# Patient Record
Sex: Female | Born: 1950
Health system: Southern US, Community
[De-identification: ages and names within clinical notes are randomized; demographics above are authoritative.]

## PROBLEM LIST (undated history)

## (undated) DIAGNOSIS — J45909 Unspecified asthma, uncomplicated: Secondary | ICD-10-CM

## (undated) DIAGNOSIS — K219 Gastro-esophageal reflux disease without esophagitis: Secondary | ICD-10-CM

## (undated) DIAGNOSIS — M1990 Unspecified osteoarthritis, unspecified site: Secondary | ICD-10-CM

## (undated) DIAGNOSIS — K7689 Other specified diseases of liver: Secondary | ICD-10-CM

## (undated) DIAGNOSIS — I10 Essential (primary) hypertension: Secondary | ICD-10-CM

## (undated) DIAGNOSIS — C801 Malignant (primary) neoplasm, unspecified: Secondary | ICD-10-CM

## (undated) DIAGNOSIS — N6019 Diffuse cystic mastopathy of unspecified breast: Secondary | ICD-10-CM

## (undated) DIAGNOSIS — G43909 Migraine, unspecified, not intractable, without status migrainosus: Secondary | ICD-10-CM

## (undated) DIAGNOSIS — G4700 Insomnia, unspecified: Secondary | ICD-10-CM

## (undated) DIAGNOSIS — H269 Unspecified cataract: Secondary | ICD-10-CM

## (undated) DIAGNOSIS — M7121 Synovial cyst of popliteal space [Baker], right knee: Secondary | ICD-10-CM

## (undated) DIAGNOSIS — E119 Type 2 diabetes mellitus without complications: Secondary | ICD-10-CM

## (undated) DIAGNOSIS — Z780 Asymptomatic menopausal state: Secondary | ICD-10-CM

## (undated) DIAGNOSIS — E785 Hyperlipidemia, unspecified: Secondary | ICD-10-CM

## (undated) DIAGNOSIS — G4730 Sleep apnea, unspecified: Secondary | ICD-10-CM

## (undated) DIAGNOSIS — E079 Disorder of thyroid, unspecified: Secondary | ICD-10-CM

## (undated) DIAGNOSIS — T7840XA Allergy, unspecified, initial encounter: Secondary | ICD-10-CM

## (undated) DIAGNOSIS — R002 Palpitations: Secondary | ICD-10-CM

## (undated) DIAGNOSIS — Z0000 Encounter for general adult medical examination without abnormal findings: Secondary | ICD-10-CM

## (undated) HISTORY — DX: Insomnia, unspecified: G47.00

## (undated) HISTORY — DX: Unspecified osteoarthritis, unspecified site: M19.90

## (undated) HISTORY — DX: Palpitations: R00.2

## (undated) HISTORY — PX: CHOLECYSTECTOMY: SHX55

## (undated) HISTORY — DX: Synovial cyst of popliteal space (Baker), right knee: M71.21

## (undated) HISTORY — DX: Diffuse cystic mastopathy of unspecified breast: N60.19

## (undated) HISTORY — DX: Asymptomatic menopausal state: Z78.0

## (undated) HISTORY — DX: Other specified diseases of liver: K76.89

## (undated) HISTORY — PX: APPENDECTOMY: SHX54

## (undated) HISTORY — DX: Hyperlipidemia, unspecified: E78.5

## (undated) HISTORY — DX: Malignant (primary) neoplasm, unspecified: C80.1

## (undated) HISTORY — DX: Unspecified cataract: H26.9

## (undated) HISTORY — PX: KNEE ARTHROPLASTY: SHX992

## (undated) HISTORY — DX: Disorder of thyroid, unspecified: E07.9

## (undated) HISTORY — DX: Sleep apnea, unspecified: G47.30

## (undated) HISTORY — PX: SHOULDER ARTHROSCOPY W/ ROTATOR CUFF REPAIR: SHX2400

## (undated) HISTORY — DX: Allergy, unspecified, initial encounter: T78.40XA

## (undated) HISTORY — DX: Migraine, unspecified, not intractable, without status migrainosus: G43.909

## (undated) HISTORY — DX: Unspecified asthma, uncomplicated: J45.909

## (undated) HISTORY — PX: ABDOMINAL HYSTERECTOMY: SHX81

## (undated) HISTORY — PX: CARPAL TUNNEL RELEASE: SHX101

## (undated) HISTORY — DX: Essential (primary) hypertension: I10

## (undated) HISTORY — DX: Gastro-esophageal reflux disease without esophagitis: K21.9

## (undated) HISTORY — DX: Type 2 diabetes mellitus without complications: E11.9

## (undated) HISTORY — DX: Encounter for general adult medical examination without abnormal findings: Z00.00

## (undated) HISTORY — PX: TUBAL LIGATION: SHX77

---

## 2000-07-18 DIAGNOSIS — C801 Malignant (primary) neoplasm, unspecified: Secondary | ICD-10-CM

## 2000-07-18 HISTORY — DX: Malignant (primary) neoplasm, unspecified: C80.1

## 2000-07-18 HISTORY — PX: THYROIDECTOMY: SHX17

## 2013-08-09 ENCOUNTER — Telehealth: Payer: Self-pay | Admitting: *Deleted

## 2013-08-22 ENCOUNTER — Encounter: Payer: Self-pay | Admitting: Internal Medicine

## 2013-08-22 ENCOUNTER — Ambulatory Visit (INDEPENDENT_AMBULATORY_CARE_PROVIDER_SITE_OTHER): Payer: BC Managed Care – PPO | Admitting: Internal Medicine

## 2013-08-22 VITALS — BP 141/86 | HR 93 | Temp 97.8°F | Resp 18 | Wt 221.0 lb

## 2013-08-22 DIAGNOSIS — M1990 Unspecified osteoarthritis, unspecified site: Secondary | ICD-10-CM

## 2013-08-22 DIAGNOSIS — I341 Nonrheumatic mitral (valve) prolapse: Secondary | ICD-10-CM

## 2013-08-22 DIAGNOSIS — K219 Gastro-esophageal reflux disease without esophagitis: Secondary | ICD-10-CM

## 2013-08-22 DIAGNOSIS — E039 Hypothyroidism, unspecified: Secondary | ICD-10-CM

## 2013-08-22 DIAGNOSIS — J45909 Unspecified asthma, uncomplicated: Secondary | ICD-10-CM

## 2013-08-22 DIAGNOSIS — J309 Allergic rhinitis, unspecified: Secondary | ICD-10-CM

## 2013-08-22 DIAGNOSIS — K449 Diaphragmatic hernia without obstruction or gangrene: Secondary | ICD-10-CM

## 2013-08-22 DIAGNOSIS — N951 Menopausal and female climacteric states: Secondary | ICD-10-CM

## 2013-08-22 DIAGNOSIS — K635 Polyp of colon: Secondary | ICD-10-CM

## 2013-08-22 DIAGNOSIS — C73 Malignant neoplasm of thyroid gland: Secondary | ICD-10-CM

## 2013-08-22 DIAGNOSIS — Z90721 Acquired absence of ovaries, unilateral: Secondary | ICD-10-CM

## 2013-08-22 DIAGNOSIS — Z85850 Personal history of malignant neoplasm of thyroid: Secondary | ICD-10-CM | POA: Insufficient documentation

## 2013-08-22 DIAGNOSIS — Z780 Asymptomatic menopausal state: Secondary | ICD-10-CM

## 2013-08-22 DIAGNOSIS — I10 Essential (primary) hypertension: Secondary | ICD-10-CM

## 2013-08-22 DIAGNOSIS — E785 Hyperlipidemia, unspecified: Secondary | ICD-10-CM | POA: Insufficient documentation

## 2013-08-22 DIAGNOSIS — Z90710 Acquired absence of both cervix and uterus: Secondary | ICD-10-CM

## 2013-08-22 DIAGNOSIS — N6019 Diffuse cystic mastopathy of unspecified breast: Secondary | ICD-10-CM

## 2013-08-22 DIAGNOSIS — I059 Rheumatic mitral valve disease, unspecified: Secondary | ICD-10-CM

## 2013-08-22 DIAGNOSIS — K589 Irritable bowel syndrome without diarrhea: Secondary | ICD-10-CM

## 2013-08-22 DIAGNOSIS — R490 Dysphonia: Secondary | ICD-10-CM

## 2013-08-22 DIAGNOSIS — G43909 Migraine, unspecified, not intractable, without status migrainosus: Secondary | ICD-10-CM | POA: Insufficient documentation

## 2013-08-22 DIAGNOSIS — D126 Benign neoplasm of colon, unspecified: Secondary | ICD-10-CM

## 2013-08-22 MED ORDER — TRIAMTERENE-HCTZ 75-50 MG PO TABS: 1.0000 | ORAL_TABLET | Freq: Every day | ORAL | Status: DC

## 2013-08-22 MED ORDER — PANTOPRAZOLE SODIUM 40 MG PO TBEC: 40.0000 mg | DELAYED_RELEASE_TABLET | Freq: Every day | ORAL | Status: DC

## 2013-08-22 NOTE — Progress Notes (Signed)
   Subjective:    Patient ID: Monica Wallace, female    DOB: [birth year], 63 y.o.   MRN: [id number]  HPI Monica Wallace is here for first visit  Primary care.  Formerly of [employer].  Lived in [place] since September.  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   PMH of HTN, Hyperlipdemia, Thyroid cancer  (S/P RAI tx) with Hypothyroidism,  MVP, DJD, fibrocystic breast disease, GERD  ( on Prilosec and Dexilant if she can afford).  Migraine headache  (Imitrex), menopause, ASthma (treated with ProAir and Advair) , HIatal hernia,  IBS, and colon polyps.    She has sister with pancreatic cancer  She is S/P hysterectomy with remaining Left oopherectomy.  She is most concerned today with chronic hoarseness for the past 3-4 months and especially noted after reflux episodes.  She had been given Delixant for GERD but frequently cannnot afford.  She will use Prilosec daily.    She tells me she feels that her neck is swollen . She does not feel any masses in her neck.   She has a history of thyroid cancer.  She has never had an ENT eval for her hoarseness.  Asthma  She will wheeze when she has a sinus infection, has known allergic rhinitis,   .  NO wheezing now.  Has not needed her Albuterol rescue.  She is on high dose ADvair 250/50 bid  She also tells me she had a BAker's cyst in the past.   Review of Systems See HPI    Objective:   Physical Exam Physical Exam  Nursing note and vitals reviewed.  Constitutional: She is oriented to person, place, and time. She appears well-developed and well-nourished.  HENT:  Head: Normocephalic and atraumatic.  Cardiovascular: Normal rate and regular rhythm. Exam reveals no gallop and no friction rub.  No murmur heard.  Pulmonary/Chest: Breath sounds normal. She has no wheezes. She has no rales.  Neurological: She is alert and oriented to person, place, and time.  Skin: Skin is warm and dry.  Psychiatric: She has a normal mood and affect. Her behavior is  normal.              Assessment & Plan:  Hoarseness  May be uncontrolled GERD I will  Change her PPI to Protonix 40 mg daily as this may be more affordable than Dexilant.  .  Will let ENT evaluate\  Allergic rhinitis continue claritin  ASthma  Peak flow today  850!!   Will be able to reduce her advair dose soon  Await ENT eval first  Thyroid Cancer  S/P thyroidectomy XRT  Will get ultrasound today   GERD/HH/IBS   See above for GERD   Colon Polyp  Hypothyroidism  Check TSHwith all labs  Hyperlipidemia  Check fasting  MVP  Will need old records  May need repeat  ECHO at ome point.    Migraine headache continue prn  Abortive tx with Imitrex    HTN  Continue meds  DJD  Fibrocystic breast disease  Migraine headache  S/P hysterectomy  Return to see me in 6 weeks

## 2013-08-22 NOTE — Patient Instructions (Signed)
Set up ENT referral with Dr. Garth Schlatter    See me in 6 weeks 30 mins  To get fasting labs

## 2013-08-23 ENCOUNTER — Ambulatory Visit (HOSPITAL_BASED_OUTPATIENT_CLINIC_OR_DEPARTMENT_OTHER)
Admission: RE | Admit: 2013-08-23 | Discharge: 2013-08-23 | Disposition: A | Discharge status: Home / Self Care | Payer: BC Managed Care – PPO | Source: Ambulatory Visit | Attending: Internal Medicine | Admitting: Internal Medicine

## 2013-08-23 DIAGNOSIS — E0789 Other specified disorders of thyroid: Secondary | ICD-10-CM | POA: Insufficient documentation

## 2013-08-23 DIAGNOSIS — Z85850 Personal history of malignant neoplasm of thyroid: Secondary | ICD-10-CM | POA: Insufficient documentation

## 2013-08-23 DIAGNOSIS — C73 Malignant neoplasm of thyroid gland: Secondary | ICD-10-CM

## 2013-08-26 DIAGNOSIS — N6019 Diffuse cystic mastopathy of unspecified breast: Secondary | ICD-10-CM | POA: Insufficient documentation

## 2013-08-26 DIAGNOSIS — I10 Essential (primary) hypertension: Secondary | ICD-10-CM | POA: Insufficient documentation

## 2013-08-26 DIAGNOSIS — K449 Diaphragmatic hernia without obstruction or gangrene: Secondary | ICD-10-CM | POA: Insufficient documentation

## 2013-08-26 DIAGNOSIS — Z90710 Acquired absence of both cervix and uterus: Secondary | ICD-10-CM | POA: Insufficient documentation

## 2013-08-26 DIAGNOSIS — J3089 Other allergic rhinitis: Secondary | ICD-10-CM | POA: Insufficient documentation

## 2013-08-26 DIAGNOSIS — K635 Polyp of colon: Secondary | ICD-10-CM | POA: Insufficient documentation

## 2013-08-26 DIAGNOSIS — J309 Allergic rhinitis, unspecified: Secondary | ICD-10-CM | POA: Insufficient documentation

## 2013-08-26 DIAGNOSIS — J4540 Moderate persistent asthma, uncomplicated: Secondary | ICD-10-CM | POA: Insufficient documentation

## 2013-08-26 DIAGNOSIS — Z90721 Acquired absence of ovaries, unilateral: Secondary | ICD-10-CM

## 2013-08-26 DIAGNOSIS — I341 Nonrheumatic mitral (valve) prolapse: Secondary | ICD-10-CM | POA: Insufficient documentation

## 2013-08-26 DIAGNOSIS — R490 Dysphonia: Secondary | ICD-10-CM | POA: Insufficient documentation

## 2013-08-26 DIAGNOSIS — Z780 Asymptomatic menopausal state: Secondary | ICD-10-CM | POA: Insufficient documentation

## 2013-08-26 DIAGNOSIS — K219 Gastro-esophageal reflux disease without esophagitis: Secondary | ICD-10-CM | POA: Insufficient documentation

## 2013-08-26 DIAGNOSIS — M1990 Unspecified osteoarthritis, unspecified site: Secondary | ICD-10-CM | POA: Insufficient documentation

## 2013-08-26 DIAGNOSIS — J45909 Unspecified asthma, uncomplicated: Secondary | ICD-10-CM | POA: Insufficient documentation

## 2013-08-26 DIAGNOSIS — E039 Hypothyroidism, unspecified: Secondary | ICD-10-CM | POA: Insufficient documentation

## 2013-08-26 DIAGNOSIS — K589 Irritable bowel syndrome without diarrhea: Secondary | ICD-10-CM | POA: Insufficient documentation

## 2013-08-27 ENCOUNTER — Telehealth: Payer: Self-pay | Admitting: *Deleted

## 2013-08-27 NOTE — Telephone Encounter (Signed)
Called home and she was not there.  Called cell and it was busy.

## 2013-08-27 NOTE — Telephone Encounter (Signed)
Message copied by Baldomero Lamy on Tue Aug 27, 2013  9:09 AM ------      Message from: Emi Belfast D      Created: Mon Aug 26, 2013  8:12 AM       Nira Conn             Call pt and let her know that her neck ultrasound is normal.  I do not see any swollen lymph nodes in her neck at all.  She will get set up with ENT for her hoarseness.   Advise her to be sure to take the Protonix daily  (can stop the Prilosec)   I will give copy of my note to you.              Advise her to keep her appt with me in 6 months            Thanks ------

## 2013-08-30 LAB — LIPID PANEL
Cholesterol: 180 mg/dL (ref 0–200)
HDL: 51 mg/dL (ref >39)
LDL Cholesterol: 106 mg/dL — ABNORMAL HIGH (ref 0–99)
Total CHOL/HDL Ratio: 3.5 Ratio
Triglycerides: 114 mg/dL (ref <150)
VLDL: 23 mg/dL (ref 0–40)

## 2013-08-30 LAB — CBC WITH DIFFERENTIAL/PLATELET
Basophils Absolute: 0 10*3/uL (ref 0.0–0.1)
Basophils Relative: 0 % (ref 0–1)
Eosinophils Absolute: 0.2 10*3/uL (ref 0.0–0.7)
Eosinophils Relative: 2 % (ref 0–5)
HCT: 40.1 % (ref 36.0–46.0)
Hemoglobin: 13.8 g/dL (ref 12.0–15.0)
Lymphocytes Relative: 36 % (ref 12–46)
Lymphs Abs: 3.7 10*3/uL (ref 0.7–4.0)
MCH: 30.1 pg (ref 26.0–34.0)
MCHC: 34.4 g/dL (ref 30.0–36.0)
MCV: 87.6 fL (ref 78.0–100.0)
Monocytes Absolute: 0.6 10*3/uL (ref 0.1–1.0)
Monocytes Relative: 6 % (ref 3–12)
Neutro Abs: 5.8 10*3/uL (ref 1.7–7.7)
Neutrophils Relative %: 56 % (ref 43–77)
Platelets: 337 10*3/uL (ref 150–400)
RBC: 4.58 MIL/uL (ref 3.87–5.11)
RDW: 13.8 % (ref 11.5–15.5)
WBC: 10.4 10*3/uL (ref 4.0–10.5)

## 2013-08-30 LAB — TSH: TSH: 0.5 u[IU]/mL (ref 0.350–4.500)

## 2013-08-30 LAB — T3, FREE: T3, Free: 2.8 pg/mL (ref 2.3–4.2)

## 2013-08-30 LAB — T4, FREE: Free T4: 1.55 ng/dL (ref 0.80–1.80)

## 2013-08-31 LAB — VITAMIN D 25 HYDROXY (VIT D DEFICIENCY, FRACTURES): Vit D, 25-Hydroxy: 59 ng/mL (ref 30–89)

## 2013-09-02 ENCOUNTER — Encounter: Payer: Self-pay | Admitting: *Deleted

## 2013-10-01 ENCOUNTER — Ambulatory Visit (INDEPENDENT_AMBULATORY_CARE_PROVIDER_SITE_OTHER): Payer: BC Managed Care – PPO | Admitting: Internal Medicine

## 2013-10-01 ENCOUNTER — Encounter: Payer: Self-pay | Admitting: Internal Medicine

## 2013-10-01 VITALS — BP 127/77 | HR 95 | Temp 97.9°F | Resp 18 | Wt 219.0 lb

## 2013-10-01 DIAGNOSIS — Z139 Encounter for screening, unspecified: Secondary | ICD-10-CM

## 2013-10-01 DIAGNOSIS — J45909 Unspecified asthma, uncomplicated: Secondary | ICD-10-CM

## 2013-10-01 DIAGNOSIS — K219 Gastro-esophageal reflux disease without esophagitis: Secondary | ICD-10-CM

## 2013-10-01 DIAGNOSIS — J309 Allergic rhinitis, unspecified: Secondary | ICD-10-CM

## 2013-10-01 MED ORDER — FLUTICASONE PROPIONATE 50 MCG/ACT NA SUSP: 2.0000 | Freq: Every day | NASAL | Status: DC

## 2013-10-01 MED ORDER — PANTOPRAZOLE SODIUM 40 MG PO TBEC: 40.0000 mg | DELAYED_RELEASE_TABLET | Freq: Every day | ORAL | Status: DC

## 2013-10-01 MED ORDER — FLUTICASONE-SALMETEROL 100-50 MCG/DOSE IN AEPB: 1.0000 | INHALATION_SPRAY | Freq: Two times a day (BID) | RESPIRATORY_TRACT | Status: DC

## 2013-10-01 NOTE — Patient Instructions (Signed)
Set up 3D mammogram at the breast center   Will refer to Dr. Cruzita Lederer  Schedule CPE for pt and her sister Pamala Hurry

## 2013-10-01 NOTE — Progress Notes (Addendum)
Subjective:    Patient ID: Monica Wallace, female    DOB: 01-04-1951, 63 y.o.   MRN: [id number]  HPI Monica Wallace is here for follow up.  She reports improvement and less hoarseness since on Protonix.  She did see ENT who also felt hoarseness likely due to GERD.     Asthma  Peak flow 450.   She is one 250 dose of Advair.  Reports lots of allergic symptoms during pollen season.  She also uses Flonase  Has not had mm in 2 years  She requests to see an endocrinologist for her thyroid cancer.  Reports she was advised to keep her TSH below 1.00  TSH now 0.5  Allergies  Allergen Reactions  . Codeine   . Levaquin [Levofloxacin]   . Penicillins    Past Medical History  Diagnosis Date  . Thyroid disease   . Arthritis   . Cancer     thyroid (2002)  . Cystic breast   . Hyperlipidemia   . Menopause   . GERD (gastroesophageal reflux disease)   . Migraine   . Hypertension    Past Surgical History  Procedure Laterality Date  . Cholecystectomy    . Appendectomy    . Cesarean section    . Knee arthroplasty    . Carpal tunnel release     History   Social History  . Marital Status: Divorced    Spouse Name: N/A    Number of Children: N/A  . Years of Education: N/A   Occupational History  . Not on file.   Social History Main Topics  . Smoking status: Never Smoker   . Smokeless tobacco: Not on file  . Alcohol Use: No  . Drug Use: Yes    Special: Cocaine  . Sexual Activity: No   Other Topics Concern  . Not on file   Social History Narrative  . No narrative on file   Family History  Problem Relation Age of Onset  . Stroke Mother   . Arthritis Mother   . Diabetes Father   . Heart disease Father   . Cancer Sister 13    pancreatic  . Heart disease Maternal Grandmother   . Asthma Maternal Grandfather   . Dementia Paternal Grandmother   . Diabetes Paternal Grandfather    Patient Active Problem List   Diagnosis Date Noted  . DJD (degenerative joint disease) 08/26/2013  .  Fibrocystic breast 08/26/2013  . HTN (hypertension) 08/26/2013  . GERD (gastroesophageal reflux disease) 08/26/2013  . S/P hysterectomy with oophorectomy 08/26/2013  . Menopause 08/26/2013  . Hypothyroidism 08/26/2013  . Chronic hoarseness 08/26/2013  . MVP (mitral valve prolapse) 08/26/2013  . Colon polyp 08/26/2013  . Hiatal hernia 08/26/2013  . Irritable bowel syndrome 08/26/2013  . Asthma 08/26/2013  . Allergic rhinitis 08/26/2013  . Hyperlipidemia 08/22/2013  . Migraine 08/22/2013  . Thyroid cancer 08/22/2013   Current Outpatient Prescriptions on File Prior to Visit  Medication Sig Dispense Refill  . b complex-C-folic acid 1 MG capsule Take 1 capsule by mouth daily.      . Cholecalciferol ([employer] VIT D 5000 HIGH-POTENCY PO) Take 5,000 Units by mouth.      . fluticasone (FLONASE) 50 MCG/ACT nasal spray Place 2 sprays into both nostrils daily.      Marland Kitchen levothyroxine (SYNTHROID, LEVOTHROID) 175 MCG tablet Take 175 mcg by mouth daily before breakfast.      . loratadine (CLARITIN) 10 MG tablet Take 10 mg by mouth daily.      Marland Kitchen  pantoprazole (PROTONIX) 40 MG tablet Take 1 tablet (40 mg total) by mouth daily.  30 tablet  3  . triamterene-hydrochlorothiazide (MAXZIDE) 75-50 MG per tablet Take 1 tablet by mouth daily.  90 tablet  0  . zinc gluconate 50 MG tablet Take 100 mg by mouth daily.      Marland Kitchen albuterol (PROVENTIL HFA;VENTOLIN HFA) 108 (90 BASE) MCG/ACT inhaler Inhale 2 puffs into the lungs every 6 (six) hours as needed for wheezing or shortness of breath.      . Fluticasone-Salmeterol (ADVAIR) 250-50 MCG/DOSE AEPB Inhale 1 puff into the lungs 2 (two) times daily.       No current facility-administered medications on file prior to visit.       Review of Systems    see HPI Objective:   Physical Exam   Physical Exam  Nursing note and vitals reviewed.  Constitutional: She is oriented to person, place, and time. She appears well-developed and well-nourished.  HENT:  Head:  Normocephalic and atraumatic.  Cardiovascular: Normal rate and regular rhythm. Exam reveals no gallop and no friction rub.  No murmur heard.  Pulmonary/Chest: Breath sounds normal. She has no wheezes. She has no rales.  Neurological: She is alert and oriented to person, place, and time.  Skin: Skin is warm and dry.  Psychiatric: She has a normal mood and affect. Her behavior is normal.              Assessment & Plan:  GERD/ hoarseness improved on Protonix  Rx 40 mg daily  Asthma  Will reduce Advair dose to 100/50  One inhalaiton bid until pollen season over then can decrease to one inhalation daily  Allergic rhinitis  Flonase 1 spray each nostril until pollen season over  Thyroid cancer will refer to [facility] endocrine.  Labs   Will schedule mm and CPE.    Addendum    MVP will need updated ECHO at some point.  Pt asymptomatic

## 2013-10-08 ENCOUNTER — Encounter: Payer: Self-pay | Admitting: Internal Medicine

## 2013-10-08 ENCOUNTER — Ambulatory Visit (INDEPENDENT_AMBULATORY_CARE_PROVIDER_SITE_OTHER): Payer: BC Managed Care – PPO | Admitting: Internal Medicine

## 2013-10-08 VITALS — BP 126/80 | HR 76 | Temp 97.7°F | Resp 12 | Ht 62.0 in | Wt 220.0 lb

## 2013-10-08 DIAGNOSIS — E890 Postprocedural hypothyroidism: Secondary | ICD-10-CM

## 2013-10-08 DIAGNOSIS — C73 Malignant neoplasm of thyroid gland: Secondary | ICD-10-CM

## 2013-10-08 MED ORDER — SYNTHROID 175 MCG PO TABS: 175.0000 ug | ORAL_TABLET | Freq: Every day | ORAL | Status: DC

## 2013-10-08 NOTE — Patient Instructions (Signed)
Please return for labs in 4-6 weeks. Please come back for a follow-up appointment in 6 months.

## 2013-10-08 NOTE — Progress Notes (Addendum)
Patient ID: Monica Wallace, female   DOB: 09/15/1950, 63 y.o.   MRN: [id number]   HPI  Monica Wallace is a 63 y.o.-year-old female, referred by her PCP, Dr. Coralyn Mark, for evaluation for thyroid cancer and postsurgical hypothyroidism. She moved here from [place] in 03/2013. Last visit with the endocrinologist in [place] (Dr. Blenda Mounts).   Pt. has been dx with thyroid cancer in 2002 >> total thyroidectomy >> encapsulated >> low grade >> RAI tx >> repeated whole body scans for 3-4 years >> after that just ultrasounds >> recent U/S: normal, no recurrence  She was dx'ed with a goiter and Hashimoto's hypothyroidism in her 69s. She has been on Levothyroxine since then.   She is on 175 mcg, taken: - fasting - with water - separated by 2h min from b'fast  - takes PPI in am, 1h after Synthroid  - for 1 mo, before this, in hs - no Ca, iron, MVI  I reviewed pt's thyroid tests: Lab Results  Component Value Date   TSH 0.500 08/30/2013   FREET4 1.55 08/30/2013    Pt denies feeling nodules in neck, + hoarseness (2/2 GERD), dysphagia/odynophagia, SOB with lying down.  Pt describes: - no cold intolerance - + weight gain (over last 5-6 years) - + fatigue - + constipation/+ diarrhea  - IBS - no dry skin - no hair falling - no depression/no anxiety  She has + FH of thyroid disorders in: mother. No FH of thyroid cancer.  No h/o radiation tx to head or neck. No recent use of iodine supplements.  ROS: Constitutional: see above Eyes: + blurry vision, no xerophthalmia ENT: no sore throat, no nodules palpated in throat, no dysphagia/odynophagia, + hoarseness, + tinnitus, + hypoacusis Cardiovascular: no CP/+ SOB/+ palpitations/+ leg swelling Respiratory: + cough/+ SOB/+ wheezing Gastrointestinal: no N/ V/+ D/+ C, + heartburn Musculoskeletal: + muscle aches/+ joint aches Skin: no rashes, + easy bruising Neurological: no tremors/numbness/tingling/dizziness, + HA Psychiatric: no depression/anxiety  Past  Medical History  Diagnosis Date  . Thyroid disease   . Arthritis   . Cancer     thyroid (2002)  . Cystic breast   . Hyperlipidemia   . Menopause   . GERD (gastroesophageal reflux disease)   . Migraine   . Hypertension    Past Surgical History  Procedure Laterality Date  . Cholecystectomy    . Appendectomy    . Cesarean section    . Knee arthroplasty    . Carpal tunnel release     History   Social History  . 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 Smoking status: Never Smoker   . Smokeless tobacco: Not on file  . Alcohol Use: No  . Drug Use: Pt denies now, in the past: yes - per chart    Special: Cocaine   Current Outpatient Prescriptions on File Prior to Visit  Medication Sig Dispense Refill  . albuterol (PROVENTIL HFA;VENTOLIN HFA) 108 (90 BASE) MCG/ACT inhaler Inhale 2 puffs into the lungs every 6 (six) hours as needed for wheezing or shortness of breath.      Marland Kitchen b complex-C-folic acid 1 MG capsule Take 1 capsule by mouth daily.      . Cholecalciferol ([employer]S VIT D 5000 HIGH-POTENCY PO) Take 5,000 Units by mouth.      . fluticasone (FLONASE) 50 MCG/ACT nasal spray Place  2 sprays into both nostrils daily.  16 g  1  . Fluticasone-Salmeterol (ADVAIR DISKUS) 100-50 MCG/DOSE AEPB Inhale 1 puff into the lungs 2 (two) times daily. One inhalation bid until pollen gone then once daily  60 each  2  . loratadine (CLARITIN) 10 MG tablet Take 10 mg by mouth daily.      Marland Kitchen OVER THE COUNTER MEDICATION Potassium      . pantoprazole (PROTONIX) 40 MG tablet Take 1 tablet (40 mg total) by mouth daily.  30 tablet  3  . triamterene-hydrochlorothiazide (MAXZIDE) 75-50 MG per tablet Take 1 tablet by mouth daily.  90 tablet  0  . zinc gluconate 50 MG tablet Take 100 mg by mouth daily.       No current facility-administered medications on file prior to visit.   Allergies  Allergen  Reactions  . Codeine   . Levaquin [Levofloxacin]   . Penicillins    Family History  Problem Relation Age of Onset  . Stroke Mother   . Arthritis Mother   . Diabetes Father   . Heart disease Father   . Cancer Sister 62    pancreatic  . Heart disease Maternal Grandmother   . Asthma Maternal Grandfather   . Dementia Paternal Grandmother   . Diabetes Paternal Grandfather    PE: BP 126/80  Pulse 76  Temp(Src) 97.7 F (36.5 C) (Oral)  Resp 12  Ht _0  (1.575 m)  Wt 220 lb (99.791 kg)  BMI 40.23 kg/m2  SpO2 97% Wt Readings from Last 3 Encounters:  10/08/13 220 lb (99.791 kg)  10/01/13 219 lb (99.338 kg)  08/22/13 221 lb (100.245 kg)   Constitutional: overweight, in NAD Eyes: PERRLA, EOMI, no exophthalmos ENT: moist mucous membranes, no cervical lymphadenopathy, healed cervical scar Cardiovascular: RRR, No MRG Respiratory: CTA B Gastrointestinal: abdomen soft, NT, ND, BS+ Musculoskeletal: no deformities, strength intact in all 4 Skin: moist, warm, no rashes Neurological: no tremor with outstretched hands, DTR normal in all 4  ASSESSMENT: 1. Papillary Thyroid cancer - dx 2002, s/p total thyroidectomy, s/p RAI tx, subsequent WBS's negative - need records  2. Hypothyroidism  PLAN:  1. Patient with long-standing hypothyroidism after thyroidectomy for PTC, on levothyroxine therapy. She appears euthyroid.  - We discussed about correct intake of levothyroxine, fasting, with water, separated by at least 30 minutes from breakfast, and separated by more than 4 hours from calcium, iron, multivitamins, acid reflux medications (PPIs). She now moved the PPI 1.5 hours after the Synthroid, I advised her to move it at lunch.  - She does not appear to have a goiter, thyroid nodules, or neck compression symptoms. Also, a recent cervical U/S was normal, w/o suspicion for recurrent cancer. - we'll check thyroid tests after 1-1.5 mo of taking the Synthroid correctly ( TSH, free T4, free T3,  Tg and ATA) - target TSH: normal range - If these are abnormal, she will need to return in 6-8 weeks for repeat labs - If these are normal, I will see her back in 6 months - will obtain records from Dr Warnell Bureau Return in about 6 months (around 04/10/2014).  Component     Latest Ref Rng 11/18/2013  TSH     0.35 - 5.50 uIU/mL 0.10 (L)  Free T4     0.60 - 1.60 ng/dL 1.26  Thyroglobulin     0.0 - 55.0 ng/mL <0.2  Thyroglobulin Ab     <40.0 IU/mL <20.0  Will decrease LT4 to 150 mcg daily.  Will repeat labs in 6-8 weeks. Tg + ATA undetectable, which is great!  Received records from Dr Donney Rankins (Endo, [place], [place]): 12/23/2008: TSH 1.77, thyroglobulin <0.20,  ATA<20 10/22/2007: TSH 1.67, thyroglobulin <0.20,  ATA<20 08/04/2006: TSH 16.96, thyroglobulin <0.20,  ATA<20 08/02/2006: Thyrogen stimulated I-123 whole body scan: Normal physiologic uptake in nares, salivary glands, gastrointestinal tract and urinary tract. No abnormal uptake indicating any functioning thyroid tissue. TSH <0.2, on Synthroid 137 mcg at bedtime, Thyroglobulin <0.20, ATA 16.96 01/31/2006 : TSH 0.448 01/04/2005: TSH 0.08 on Synthroid 150 mcg at bedtime, thyroglobulin <0.20, ATA <20 05/24/2004: whole-body scan: Negative  Thyroglobulin 0.3, ATA <20 12/05/2001: whole-body scan: Negative  12/28/2000: Posttreatment whole-body scan: Residual functioning thyroid tissue in the thyroid bed in the midline and extending towards the left. No distant metastases identified. 12/18/2000: RAI Tx 100 mCi I131 10/25/2000: Thyroidectomy 02/16/2001: Thyroid ultrasound: multinodular goiter with a very large right thyroid nodule (3.6 x 2.3 x 1.7 cm) 11/04/1996: Thyroid ultrasound: Right thyroid lobe complex nodule, 1.7 x 1.9 x 3.4 cm, solid, with a small cystic component. No pathology records available for review.

## 2013-10-08 NOTE — Telephone Encounter (Signed)
appt request

## 2013-10-10 ENCOUNTER — Encounter: Payer: Self-pay | Admitting: *Deleted

## 2013-11-13 ENCOUNTER — Other Ambulatory Visit: Payer: BC Managed Care – PPO

## 2013-11-15 ENCOUNTER — Other Ambulatory Visit: Payer: Self-pay | Admitting: *Deleted

## 2013-11-18 ENCOUNTER — Ambulatory Visit: Payer: BC Managed Care – PPO

## 2013-11-18 ENCOUNTER — Other Ambulatory Visit (INDEPENDENT_AMBULATORY_CARE_PROVIDER_SITE_OTHER): Payer: BC Managed Care – PPO

## 2013-11-18 DIAGNOSIS — C73 Malignant neoplasm of thyroid gland: Secondary | ICD-10-CM

## 2013-11-18 DIAGNOSIS — E039 Hypothyroidism, unspecified: Secondary | ICD-10-CM

## 2013-11-18 LAB — T4, FREE: Free T4: 1.26 ng/dL (ref 0.60–1.60)

## 2013-11-18 LAB — TSH: TSH: 0.1 u[IU]/mL — ABNORMAL LOW (ref 0.35–5.50)

## 2013-11-19 ENCOUNTER — Encounter: Payer: Self-pay | Admitting: Podiatry

## 2013-11-19 ENCOUNTER — Ambulatory Visit (INDEPENDENT_AMBULATORY_CARE_PROVIDER_SITE_OTHER): Payer: BC Managed Care – PPO | Admitting: Podiatry

## 2013-11-19 ENCOUNTER — Encounter: Payer: Self-pay | Admitting: *Deleted

## 2013-11-19 VITALS — BP 140/77 | HR 77 | Ht 62.0 in | Wt 220.0 lb

## 2013-11-19 DIAGNOSIS — M216X9 Other acquired deformities of unspecified foot: Secondary | ICD-10-CM

## 2013-11-19 DIAGNOSIS — M659 Synovitis and tenosynovitis, unspecified: Secondary | ICD-10-CM | POA: Insufficient documentation

## 2013-11-19 DIAGNOSIS — M722 Plantar fascial fibromatosis: Secondary | ICD-10-CM | POA: Insufficient documentation

## 2013-11-19 DIAGNOSIS — C73 Malignant neoplasm of thyroid gland: Secondary | ICD-10-CM

## 2013-11-19 DIAGNOSIS — M25473 Effusion, unspecified ankle: Secondary | ICD-10-CM | POA: Insufficient documentation

## 2013-11-19 DIAGNOSIS — R609 Edema, unspecified: Secondary | ICD-10-CM

## 2013-11-19 LAB — THYROGLOBULIN ANTIBODY: Thyroglobulin Ab: <20 IU/mL (ref <40.0)

## 2013-11-19 LAB — THYROGLOBULIN LEVEL: Thyroglobulin: <0.2 ng/mL (ref 0.0–55.0)

## 2013-11-19 MED ORDER — SYNTHROID 150 MCG PO TABS: 150.0000 ug | ORAL_TABLET | Freq: Every day | ORAL | Status: DC

## 2013-11-19 NOTE — Progress Notes (Signed)
Subjective: 63 year old female presents complaining of pain on feet over a week. Used to use inserts.  Initially had right heel pain and now right ankle is swollen and painful.  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  At this time she cannot wear any heel.  She was treated for plantar fasciitis in 90's and was treated with orthotics. She was in [place] 2011-2013 and treated with exercise and stretch device.  She moved here last September.  Objective: Dermatologic: Normal skin without any visible skin lesions. Vascular: All pedal pulses are palpable. Positive of severe right lateral ankle edema without redness or increased in temperature.  Neurologic: All epicritic and tactile sensations grossly intact. Orthopedic: Increased sagittal plane motion of the first ray bilateral. Increased subtalar joint pronation bilateral.  Subjective: 1. Tenosynovitis right ankle. 2. Plantar fasciitis right. 3. Right ankle edema. 4. Pain in lower limb right.  5. Hyperpronation STJ bilateral. 6. Deformed Metatarsal bilateral.  Plan: Reviewed clinical findings and available treatment options. Right foot wrapped with Ace and Ankle brace. Return in one week with orthotics.

## 2013-11-19 NOTE — Addendum Note (Signed)
Addended by: Philemon Kingdom on: 11/19/2013 12:48 PM   Modules accepted: Orders, Medications, Level of Service

## 2013-11-19 NOTE — Patient Instructions (Signed)
Seen for pain in right heel and ankle. Placed right foot in ace wrap and ankle brace. Return in one week with existing orthotics.

## 2013-11-20 ENCOUNTER — Ambulatory Visit: Payer: Self-pay | Admitting: Podiatry

## 2013-11-24 ENCOUNTER — Other Ambulatory Visit: Payer: Self-pay | Admitting: Internal Medicine

## 2013-11-25 NOTE — Telephone Encounter (Signed)
Refill request

## 2013-11-26 ENCOUNTER — Ambulatory Visit (INDEPENDENT_AMBULATORY_CARE_PROVIDER_SITE_OTHER): Payer: BC Managed Care – PPO | Admitting: Podiatry

## 2013-11-26 ENCOUNTER — Encounter: Payer: Self-pay | Admitting: Podiatry

## 2013-11-26 VITALS — BP 140/77 | HR 83

## 2013-11-26 DIAGNOSIS — M659 Synovitis and tenosynovitis, unspecified: Secondary | ICD-10-CM

## 2013-11-26 NOTE — Progress Notes (Signed)
Some improvement with Ankle brace. Lateral ankle still tender. Not able to take anti inflammatory medication due to up set stomach.  Will try compounding cream to reduce pain and inflammation.

## 2013-11-26 NOTE — Patient Instructions (Signed)
Seen for right foot and ankle pain. Compound cream prescribed.

## 2013-12-02 ENCOUNTER — Encounter: Payer: Self-pay | Admitting: Podiatry

## 2013-12-05 ENCOUNTER — Other Ambulatory Visit: Payer: Self-pay | Admitting: Internal Medicine

## 2013-12-06 NOTE — Telephone Encounter (Signed)
Refill request

## 2013-12-17 ENCOUNTER — Ambulatory Visit: Payer: BC Managed Care – PPO | Admitting: Podiatry

## 2013-12-18 ENCOUNTER — Ambulatory Visit: Payer: BC Managed Care – PPO

## 2013-12-25 ENCOUNTER — Encounter (INDEPENDENT_AMBULATORY_CARE_PROVIDER_SITE_OTHER): Payer: Self-pay

## 2013-12-25 ENCOUNTER — Ambulatory Visit
Admission: RE | Admit: 2013-12-25 | Discharge: 2013-12-25 | Disposition: A | Discharge status: Home / Self Care | Payer: BC Managed Care – PPO | Source: Ambulatory Visit | Attending: Internal Medicine | Admitting: Internal Medicine

## 2013-12-25 ENCOUNTER — Other Ambulatory Visit: Payer: Self-pay | Admitting: Internal Medicine

## 2013-12-25 ENCOUNTER — Ambulatory Visit: Admission: RE | Admit: 2013-12-25 | Payer: BC Managed Care – PPO | Source: Ambulatory Visit

## 2013-12-25 DIAGNOSIS — Z139 Encounter for screening, unspecified: Secondary | ICD-10-CM

## 2014-02-10 ENCOUNTER — Encounter: Payer: BC Managed Care – PPO | Admitting: Internal Medicine

## 2014-02-17 DIAGNOSIS — M7120 Synovial cyst of popliteal space [Baker], unspecified knee: Secondary | ICD-10-CM | POA: Diagnosis not present

## 2014-02-17 DIAGNOSIS — M25579 Pain in unspecified ankle and joints of unspecified foot: Secondary | ICD-10-CM | POA: Diagnosis not present

## 2014-02-17 DIAGNOSIS — IMO0002 Reserved for concepts with insufficient information to code with codable children: Secondary | ICD-10-CM | POA: Diagnosis not present

## 2014-02-17 DIAGNOSIS — M659 Synovitis and tenosynovitis, unspecified: Secondary | ICD-10-CM | POA: Diagnosis not present

## 2014-03-17 DIAGNOSIS — M25579 Pain in unspecified ankle and joints of unspecified foot: Secondary | ICD-10-CM | POA: Diagnosis not present

## 2014-03-22 DIAGNOSIS — M25579 Pain in unspecified ankle and joints of unspecified foot: Secondary | ICD-10-CM | POA: Diagnosis not present

## 2014-04-02 ENCOUNTER — Encounter: Payer: BC Managed Care – PPO | Admitting: Internal Medicine

## 2014-04-07 DIAGNOSIS — M25579 Pain in unspecified ankle and joints of unspecified foot: Secondary | ICD-10-CM | POA: Diagnosis not present

## 2014-04-10 ENCOUNTER — Ambulatory Visit: Payer: BC Managed Care – PPO | Admitting: Internal Medicine

## 2014-04-28 DIAGNOSIS — M7671 Peroneal tendinitis, right leg: Secondary | ICD-10-CM | POA: Diagnosis not present

## 2014-05-19 ENCOUNTER — Encounter: Payer: Self-pay | Admitting: Podiatry

## 2014-05-19 DIAGNOSIS — M7671 Peroneal tendinitis, right leg: Secondary | ICD-10-CM | POA: Diagnosis not present

## 2014-05-26 ENCOUNTER — Other Ambulatory Visit: Payer: Self-pay | Admitting: *Deleted

## 2014-05-26 NOTE — Telephone Encounter (Signed)
Refill request

## 2014-05-27 MED ORDER — TRIAMTERENE-HCTZ 75-50 MG PO TABS: ORAL_TABLET | ORAL | Status: DC

## 2014-05-27 NOTE — Telephone Encounter (Signed)
Monica Wallace  Let pt know I sent a refill of her maxzide to [facility] but I do not have any blood chemistry results on her.    Ok to come in for labs only

## 2014-05-28 NOTE — Telephone Encounter (Signed)
Daryl is coming for her CPE next month and will have labs drawn then -eh

## 2014-06-26 DIAGNOSIS — M7671 Peroneal tendinitis, right leg: Secondary | ICD-10-CM | POA: Diagnosis not present

## 2014-06-30 ENCOUNTER — Other Ambulatory Visit: Payer: Self-pay | Admitting: *Deleted

## 2014-07-01 ENCOUNTER — Encounter: Payer: Self-pay | Admitting: Internal Medicine

## 2014-07-01 ENCOUNTER — Ambulatory Visit (INDEPENDENT_AMBULATORY_CARE_PROVIDER_SITE_OTHER): Payer: BC Managed Care – PPO | Admitting: Internal Medicine

## 2014-07-01 VITALS — BP 129/75 | HR 73 | Resp 16 | Ht 62.0 in | Wt 218.0 lb

## 2014-07-01 DIAGNOSIS — I059 Rheumatic mitral valve disease, unspecified: Secondary | ICD-10-CM

## 2014-07-01 DIAGNOSIS — Z0000 Encounter for general adult medical examination without abnormal findings: Secondary | ICD-10-CM | POA: Diagnosis not present

## 2014-07-01 DIAGNOSIS — I10 Essential (primary) hypertension: Secondary | ICD-10-CM

## 2014-07-01 DIAGNOSIS — R490 Dysphonia: Secondary | ICD-10-CM

## 2014-07-01 DIAGNOSIS — Z23 Encounter for immunization: Secondary | ICD-10-CM | POA: Diagnosis not present

## 2014-07-01 DIAGNOSIS — E785 Hyperlipidemia, unspecified: Secondary | ICD-10-CM

## 2014-07-01 DIAGNOSIS — E038 Other specified hypothyroidism: Secondary | ICD-10-CM

## 2014-07-01 DIAGNOSIS — Z1211 Encounter for screening for malignant neoplasm of colon: Secondary | ICD-10-CM | POA: Diagnosis not present

## 2014-07-01 LAB — COMPREHENSIVE METABOLIC PANEL
ALT: 14 U/L (ref 0–35)
AST: 21 U/L (ref 0–37)
Albumin: 3.7 g/dL (ref 3.5–5.2)
Alkaline Phosphatase: 95 U/L (ref 39–117)
BUN: 12 mg/dL (ref 6–23)
CO2: 27 mEq/L (ref 19–32)
Calcium: 9 mg/dL (ref 8.4–10.5)
Chloride: 97 mEq/L (ref 96–112)
Creat: 0.85 mg/dL (ref 0.50–1.10)
Glucose, Bld: 85 mg/dL (ref 70–99)
Potassium: 3.6 mEq/L (ref 3.5–5.3)
Sodium: 138 mEq/L (ref 135–145)
Total Bilirubin: 0.3 mg/dL (ref 0.2–1.2)
Total Protein: 6.6 g/dL (ref 6.0–8.3)

## 2014-07-01 LAB — POCT URINALYSIS DIPSTICK
Bilirubin, UA: NEGATIVE
Blood, UA: NEGATIVE
Glucose, UA: NEGATIVE
Ketones, UA: NEGATIVE
Leukocytes, UA: NEGATIVE
Nitrite, UA: NEGATIVE
Protein, UA: NEGATIVE
Spec Grav, UA: 1.01
Urobilinogen, UA: 0.2
pH, UA: 7

## 2014-07-01 LAB — CBC
HCT: 39.6 % (ref 36.0–46.0)
Hemoglobin: 13.2 g/dL (ref 12.0–15.0)
MCH: 29.3 pg (ref 26.0–34.0)
MCHC: 33.3 g/dL (ref 30.0–36.0)
MCV: 88 fL (ref 78.0–100.0)
MPV: 9.9 fL (ref 9.4–12.4)
Platelets: 361 10*3/uL (ref 150–400)
RBC: 4.5 MIL/uL (ref 3.87–5.11)
RDW: 13.7 % (ref 11.5–15.5)
WBC: 8.6 10*3/uL (ref 4.0–10.5)

## 2014-07-01 LAB — HEMOCCULT GUIAC POC 1CARD (OFFICE)
Card #2 Fecal Occult Blod, POC: NEGATIVE
Fecal Occult Blood, POC: NEGATIVE

## 2014-07-01 LAB — LIPID PANEL
Cholesterol: 193 mg/dL (ref 0–200)
HDL: 47 mg/dL (ref >39)
LDL Cholesterol: 123 mg/dL — ABNORMAL HIGH (ref 0–99)
Total CHOL/HDL Ratio: 4.1 Ratio
Triglycerides: 115 mg/dL (ref <150)
VLDL: 23 mg/dL (ref 0–40)

## 2014-07-01 LAB — HEMOGLOBIN A1C
Hgb A1c MFr Bld: 6.3 % — ABNORMAL HIGH (ref <5.7)
Mean Plasma Glucose: 134 mg/dL — ABNORMAL HIGH (ref <117)

## 2014-07-01 LAB — TSH: TSH: 0.997 u[IU]/mL (ref 0.350–4.500)

## 2014-07-01 MED ORDER — NYSTATIN-TRIAMCINOLONE 100000-0.1 UNIT/GM-% EX OINT: 1.0000 "application " | TOPICAL_OINTMENT | Freq: Two times a day (BID) | CUTANEOUS | Status: DC

## 2014-07-01 NOTE — Patient Instructions (Addendum)
Will set up GI referral   Dr Collene Mares  To lab today   Will set up 2D echo    See me 30 mins after ECHO test is done

## 2014-07-01 NOTE — Progress Notes (Signed)
Subjective:    Patient ID: Monica Wallace, female    DOB: Jan 10, 1951, 63 y.o.   MRN: [id number]  HPI 10/01/2013 GERD/ hoarseness improved on Protonix Rx 40 mg daily  Asthma Will reduce Advair dose to 100/50 One inhalaiton bid until pollen season over then can decrease to one inhalation daily  Allergic rhinitis Flonase 1 spray each nostril until pollen season over  Thyroid cancer will refer to [facility] endocrine. Labs   Will schedule mm and CPE.   Addendum   MVP will need updated ECHO at some point. Pt asymptomatic         Revision History      TODAY:  Cristel is here for CPE:  HM:  She is due for all vaccines except flu,  Mm done June,  Colonoscopy 2013,   Pt is a non-smoker   S/P hysterectomy   Reports vaginal itching externall no vaginal discharge  S/P hysterectomy and R oopherectomy for adhesions  Asthma no rescue inhaler use      Allergies  Allergen Reactions  . Codeine   . Levaquin [Levofloxacin]   . Penicillins    Past Medical History  Diagnosis Date  . Thyroid disease   . Arthritis   . Cancer     thyroid (2002)  . Cystic breast   . Hyperlipidemia   . Menopause   . GERD (gastroesophageal reflux disease)   . Migraine   . Hypertension    Past Surgical History  Procedure Laterality Date  . Cholecystectomy    . Appendectomy    . Cesarean section    . Knee arthroplasty    . Carpal tunnel release     History   Social History  . Marital Status: Divorced    Spouse Name: N/A    Number of Children: N/A  . Years of Education: N/A   Occupational History  . Not on file.   Social History Main Topics  . Smoking status: Never Smoker   . Smokeless tobacco: Not on file  . Alcohol Use: No  . Drug Use: Yes    Special: Cocaine  . Sexual Activity: No     Comment: Hysterectomy   Other Topics Concern  . Not on file   Social History Narrative   Family History  Problem Relation Age of Onset  . Stroke Mother   . Arthritis Mother   .  Diabetes Father   . Heart disease Father   . Cancer Sister 58    pancreatic  . Heart disease Maternal Grandmother   . Asthma Maternal Grandfather   . Dementia Paternal Grandmother   . Diabetes Paternal Grandfather    Patient Active Problem List   Diagnosis Date Noted  . Plantar fasciitis of right foot 11/19/2013  . Tenosynovitis of foot and ankle 11/19/2013  . Ankle edema 11/19/2013  . Pronation deformity of ankle, acquired 11/19/2013  . DJD (degenerative joint disease) 08/26/2013  . Fibrocystic breast 08/26/2013  . HTN (hypertension) 08/26/2013  . GERD (gastroesophageal reflux disease) 08/26/2013  . S/P hysterectomy with oophorectomy 08/26/2013  . Menopause 08/26/2013  . Hypothyroidism 08/26/2013  . Chronic hoarseness 08/26/2013  . MVP (mitral valve prolapse) 08/26/2013  . Colon polyp 08/26/2013  . Hiatal hernia 08/26/2013  . Irritable bowel syndrome 08/26/2013  . Asthma 08/26/2013  . Allergic rhinitis 08/26/2013  . Hyperlipidemia 08/22/2013  . Migraine 08/22/2013  . Thyroid cancer 08/22/2013   Current Outpatient Prescriptions on File Prior to Visit  Medication Sig Dispense Refill  . albuterol (PROAIR  HFA) 108 (90 BASE) MCG/ACT inhaler Inhale 2 inhaltions TID prn 8.5 g 0  . b complex-C-folic acid 1 MG capsule Take 1 capsule by mouth daily.    . Cholecalciferol ([employer] VIT D 5000 HIGH-POTENCY PO) Take 5,000 Units by mouth.    . fluticasone (FLONASE) 50 MCG/ACT nasal spray Place 2 sprays into both nostrils daily. 16 g 1  . Fluticasone-Salmeterol (ADVAIR DISKUS) 100-50 MCG/DOSE AEPB Inhale 1 puff into the lungs 2 (two) times daily. One inhalation bid until pollen gone then once daily 60 each 2  . loratadine (CLARITIN) 10 MG tablet Take 10 mg by mouth daily.    Marland Kitchen OVER THE COUNTER MEDICATION Potassium    . pantoprazole (PROTONIX) 40 MG tablet Take 1 tablet (40 mg total) by mouth daily. 30 tablet 3  . SYNTHROID 150 MCG tablet Take 1 tablet (150 mcg total) by mouth daily before  breakfast. 60 tablet 3  . triamterene-hydrochlorothiazide (MAXZIDE) 75-50 MG per tablet TAKE 1 TABLET BY MOUTH EVERY DAY 90 tablet 0  . zinc gluconate 50 MG tablet Take 100 mg by mouth daily.     No current facility-administered medications on file prior to visit.       Review of Systems  Respiratory: Negative for cough, chest tightness, shortness of breath and wheezing.   Cardiovascular: Negative for chest pain, palpitations and leg swelling.  All other systems reviewed and are negative.      Objective:   Physical Exam  Physical Exam  Nursing note and vitals reviewed.  Constitutional: She is oriented to person, place, and time. She appears well-developed and well-nourished.  HENT:  Head: Normocephalic and atraumatic.  Right Ear: Tympanic membrane and ear canal normal. No drainage. Tympanic membrane is not injected and not erythematous.  Left Ear: Tympanic membrane and ear canal normal. No drainage. Tympanic membrane is not injected and not erythematous.  Nose: Nose normal. Right sinus exhibits no maxillary sinus tenderness and no frontal sinus tenderness. Left sinus exhibits no maxillary sinus tenderness and no frontal sinus tenderness.  Mouth/Throat: Oropharynx is clear and moist. No oral lesions. No oropharyngeal exudate.  Eyes: Conjunctivae and EOM are normal. Pupils are equal, round, and reactive to light.  Neck: Normal range of motion. Neck supple. No JVD present. Carotid bruit is not present. No mass and no thyromegaly present.  Cardiovascular: Normal rate, regular rhythm, S1 normal, S2 normal and intact distal pulses. Exam reveals no gallop and no friction rub.  No murmur heard.  Pulses:  Carotid pulses are 2+ on the right side, and 2+ on the left side.  Dorsalis pedis pulses are 2+ on the right side, and 2+ on the left side.  No carotid bruit. No LE edema  Pulmonary/Chest: Breath sounds normal. She has no wheezes. She has no rales. She exhibits no tenderness.   Breast no  discrete mass no nipple discharge no axillary adenopathy bilaterally Abdominal: Soft. Bowel sounds are normal. She exhibits no distension and no mass. There is no hepatosplenomegaly. There is no tenderness. There is no CVA tenderness. Pelvic  Red rash intertrigo area labia normal   Musculoskeletal: Normal range of motion.  No active synovitis to joints.  Lymphadenopathy:  She has no cervical adenopathy.  She has no axillary adenopathy.  Right: No inguinal and no supraclavicular adenopathy present.  Left: No inguinal and no supraclavicular adenopathy present.  Neurological: She is alert and oriented to person, place, and time. She has normal strength and normal reflexes. She displays no tremor. No cranial  nerve deficit or sensory deficit. Coordination and gait normal.  Skin: Skin is warm and dry. No rash noted. No cyanosis. Nails show no clubbing.  Psychiatric: She has a normal mood and affect. Her speech is normal and behavior is normal. Cognition and memory are normal.         Assessment & Plan:  HM:  S/P hsyterectomy ,  Tdap today ,   Pt non-smoker,  Zostavax RX given,  Will refer to GI  Mm done 12/2013  HTN:   Continue meds  MVP:  Will get 2D echo   Intertrigo  Ok for Mycolog bid   OK for [place] twice a week.    Hyperlipidemia  Continue meds  Recheck today   Hypothyroidisim/Thyroid CA  She has seen Dr Cruzita Lederer  GERD  On Protonix    Asthma  Peak flow great  No rescue inhaler use.    Chronic hoarseness has had ENT eval   See  Me after ECHO eval

## 2014-07-02 LAB — VITAMIN D 25 HYDROXY (VIT D DEFICIENCY, FRACTURES): Vit D, 25-Hydroxy: 23 ng/mL — ABNORMAL LOW (ref 30–100)

## 2014-07-09 ENCOUNTER — Other Ambulatory Visit: Payer: Self-pay | Admitting: Internal Medicine

## 2014-07-09 NOTE — Telephone Encounter (Signed)
Refill request

## 2014-07-21 ENCOUNTER — Ambulatory Visit (HOSPITAL_COMMUNITY): Payer: BLUE CROSS/BLUE SHIELD | Attending: Cardiology | Admitting: Cardiology

## 2014-07-21 DIAGNOSIS — I059 Rheumatic mitral valve disease, unspecified: Secondary | ICD-10-CM | POA: Diagnosis not present

## 2014-07-21 DIAGNOSIS — E785 Hyperlipidemia, unspecified: Secondary | ICD-10-CM | POA: Insufficient documentation

## 2014-07-21 DIAGNOSIS — I340 Nonrheumatic mitral (valve) insufficiency: Secondary | ICD-10-CM | POA: Insufficient documentation

## 2014-07-21 DIAGNOSIS — I10 Essential (primary) hypertension: Secondary | ICD-10-CM | POA: Insufficient documentation

## 2014-07-21 DIAGNOSIS — I341 Nonrheumatic mitral (valve) prolapse: Secondary | ICD-10-CM | POA: Diagnosis not present

## 2014-07-21 DIAGNOSIS — I371 Nonrheumatic pulmonary valve insufficiency: Secondary | ICD-10-CM | POA: Diagnosis not present

## 2014-07-21 NOTE — Progress Notes (Signed)
Echo performed. 

## 2014-07-24 ENCOUNTER — Telehealth: Payer: Self-pay | Admitting: *Deleted

## 2014-07-24 NOTE — Telephone Encounter (Signed)
-----   Message from Lanice Shirts, MD sent at 07/23/2014  7:40 AM EST ----- Call pt and let her know that her 2D Echo shows her heart pump is strong and no mention is made of any mitral valve prolapse.   Test is good

## 2014-07-24 NOTE — Telephone Encounter (Signed)
Monica Wallace is aware of her test results

## 2014-07-25 ENCOUNTER — Other Ambulatory Visit: Payer: Self-pay | Admitting: Internal Medicine

## 2014-08-21 DIAGNOSIS — R1013 Epigastric pain: Secondary | ICD-10-CM | POA: Diagnosis not present

## 2014-08-21 DIAGNOSIS — K219 Gastro-esophageal reflux disease without esophagitis: Secondary | ICD-10-CM | POA: Diagnosis not present

## 2014-08-21 DIAGNOSIS — K5900 Constipation, unspecified: Secondary | ICD-10-CM | POA: Diagnosis not present

## 2014-08-21 DIAGNOSIS — Z800 Family history of malignant neoplasm of digestive organs: Secondary | ICD-10-CM | POA: Diagnosis not present

## 2014-08-27 ENCOUNTER — Other Ambulatory Visit: Payer: Self-pay | Admitting: Internal Medicine

## 2014-08-27 NOTE — Telephone Encounter (Signed)
Refill request

## 2014-09-02 ENCOUNTER — Encounter: Payer: Self-pay | Admitting: *Deleted

## 2014-09-09 ENCOUNTER — Ambulatory Visit (INDEPENDENT_AMBULATORY_CARE_PROVIDER_SITE_OTHER): Payer: BLUE CROSS/BLUE SHIELD | Admitting: Internal Medicine

## 2014-09-09 ENCOUNTER — Encounter: Payer: Self-pay | Admitting: Internal Medicine

## 2014-09-09 VITALS — BP 138/80 | HR 70 | Resp 16 | Ht 62.5 in | Wt 217.0 lb

## 2014-09-09 DIAGNOSIS — R002 Palpitations: Secondary | ICD-10-CM | POA: Diagnosis not present

## 2014-09-09 NOTE — Progress Notes (Signed)
Subjective:    Patient ID: Monica Wallace, female    DOB: 04-16-1951, 64 y.o.   MRN: [id number]  HPI  06/2014 note Assessment & Plan:  HM: S/P hsyterectomy , Tdap today , Pt non-smoker, Zostavax RX given, Will refer to GI Mm done 12/2013  HTN: Continue meds  MVP: Will get 2D echo   Intertrigo Ok for Mycolog bid OK for [place] twice a week.   Hyperlipidemia Continue meds Recheck today   Hypothyroidisim/Thyroid CA She has seen Dr Monica Wallace  GERD On Protonix   Asthma Peak flow great No rescue inhaler use.   Chronic hoarseness has had ENT eval   See Me after ECHO eval         TODAY :  Monica Wallace is here for acute visit .   She reports H/O PVC and MVP  (note recent ECHO shows structurally normal valve)  She reports worsening palpitaitons some lasting for "hours".  Usually at night.   No chest pain,  No SOB,   No LOC or pre-syncope feelings.  Does not drink coffee,  Drinks lots of tea  She would like to see a cardiologist   Lots of stress now with sister who has pancreatic cancer and elderly mother.     Allergies  Allergen Reactions  . Codeine   . Levaquin [Levofloxacin]   . Penicillins    Past Medical History  Diagnosis Date  . Thyroid disease   . Arthritis   . Cancer     thyroid (2002)  . Cystic breast   . Hyperlipidemia   . Menopause   . GERD (gastroesophageal reflux disease)   . Migraine   . Hypertension    Past Surgical History  Procedure Laterality Date  . Cholecystectomy    . Appendectomy    . Cesarean section    . Knee arthroplasty    . Carpal tunnel release     History   Social History  . Marital Status: Divorced    Spouse Name: N/A  . Number of Children: N/A  . Years of Education: N/A   Occupational History  . Not on file.   Social History Main Topics  . Smoking status: Never Smoker   . Smokeless tobacco: Not on file  . Alcohol Use: No  . Drug Use: Yes    Special: Cocaine  . Sexual Activity: No     Comment:  Hysterectomy   Other Topics Concern  . Not on file   Social History Narrative   Family History  Problem Relation Age of Onset  . Stroke Mother   . Arthritis Mother   . Diabetes Father   . Heart disease Father   . Cancer Sister 10    pancreatic  . Heart disease Maternal Grandmother   . Asthma Maternal Grandfather   . Dementia Paternal Grandmother   . Diabetes Paternal Grandfather    Patient Active Problem List   Diagnosis Date Noted  . Plantar fasciitis of right foot 11/19/2013  . Tenosynovitis of foot and ankle 11/19/2013  . Ankle edema 11/19/2013  . Pronation deformity of ankle, acquired 11/19/2013  . DJD (degenerative joint disease) 08/26/2013  . Fibrocystic breast 08/26/2013  . HTN (hypertension) 08/26/2013  . GERD (gastroesophageal reflux disease) 08/26/2013  . S/P hysterectomy with oophorectomy 08/26/2013  . Menopause 08/26/2013  . Hypothyroidism 08/26/2013  . Chronic hoarseness 08/26/2013  . MVP (mitral valve prolapse) 08/26/2013  . Colon polyp 08/26/2013  . Hiatal hernia 08/26/2013  . Irritable bowel syndrome 08/26/2013  .  Asthma 08/26/2013  . Allergic rhinitis 08/26/2013  . Hyperlipidemia 08/22/2013  . Migraine 08/22/2013  . Thyroid cancer 08/22/2013   Current Outpatient Prescriptions on File Prior to Visit  Medication Sig Dispense Refill  . acetaminophen (TYLENOL) 325 MG tablet Take 650 mg by mouth every 6 (six) hours as needed.    Marland Kitchen albuterol (PROAIR HFA) 108 (90 BASE) MCG/ACT inhaler Inhale 2 inhaltions TID prn 8.5 g 0  . b complex-C-folic acid 1 MG capsule Take 1 capsule by mouth daily.    . Cholecalciferol ([employer]S VIT D 5000 HIGH-POTENCY PO) Take 5,000 Units by mouth.    . fluticasone (FLONASE) 50 MCG/ACT nasal spray Place 2 sprays into both nostrils daily. 16 g 1  . Fluticasone-Salmeterol (ADVAIR DISKUS) 100-50 MCG/DOSE AEPB Inhale 1 puff into the lungs 2 (two) times daily. One inhalation bid until pollen gone then once daily 60 each 2  . ibuprofen  (ADVIL,MOTRIN) 200 MG tablet Take 200 mg by mouth every 6 (six) hours as needed.    . loratadine (CLARITIN) 10 MG tablet Take 10 mg by mouth daily.    Marland Kitchen nystatin-triamcinolone ointment (MYCOLOG) Apply 1 application topically 2 (two) times daily. 30 g 0  . OVER THE COUNTER MEDICATION Potassium    . pantoprazole (PROTONIX) 40 MG tablet TAKE 1 TABLET BY MOUTH EVERY DAY 90 tablet 1  . pseudoephedrine-acetaminophen (TYLENOL SINUS) 30-500 MG TABS Take 1 tablet by mouth every 4 (four) hours as needed.    Marland Kitchen SYNTHROID 150 MCG tablet TAKE 1 TABLET BY MOUTH EVERY DAY BEFORE BREAKFAST 60 tablet 0  . triamterene-hydrochlorothiazide (MAXZIDE) 75-50 MG per tablet TAKE 1 TABLET BY MOUTH EVERY DAY 90 tablet 1  . zinc gluconate 50 MG tablet Take 100 mg by mouth daily.     No current facility-administered medications on file prior to visit.      Review of Systems    see HPI Objective:   Physical Exam  Physical Exam  Nursing note and vitals reviewed.  Constitutional: She is oriented to person, place, and time. She appears well-developed and well-nourished.  HENT:  Head: Normocephalic and atraumatic.  Cardiovascular: Normal rate and regular rhythm. Exam reveals no gallop and no friction rub.  No murmur heard.  Pulmonary/Chest: Breath sounds normal. She has no wheezes. She has no rales.  Neurological: She is alert and oriented to person, place, and time.  Skin: Skin is warm and dry.  Psychiatric: She has a normal mood and affect. Her behavior is normal.        Assessment & Plan:  Palpitations  EKG  SR no evidence of PVC"S.    Recent TSH normal     H/O   MVP   Recent ECHO normal     OK for referral

## 2014-09-15 ENCOUNTER — Encounter (HOSPITAL_BASED_OUTPATIENT_CLINIC_OR_DEPARTMENT_OTHER): Payer: Self-pay | Admitting: Emergency Medicine

## 2014-09-15 ENCOUNTER — Emergency Department (HOSPITAL_BASED_OUTPATIENT_CLINIC_OR_DEPARTMENT_OTHER)
Admission: EM | Admit: 2014-09-15 | Discharge: 2014-09-15 | Disposition: A | Discharge status: Home / Self Care | Payer: BLUE CROSS/BLUE SHIELD | Attending: Emergency Medicine | Admitting: Emergency Medicine

## 2014-09-15 ENCOUNTER — Emergency Department (HOSPITAL_BASED_OUTPATIENT_CLINIC_OR_DEPARTMENT_OTHER): Payer: BLUE CROSS/BLUE SHIELD

## 2014-09-15 DIAGNOSIS — Z7952 Long term (current) use of systemic steroids: Secondary | ICD-10-CM | POA: Diagnosis not present

## 2014-09-15 DIAGNOSIS — R002 Palpitations: Secondary | ICD-10-CM | POA: Diagnosis not present

## 2014-09-15 DIAGNOSIS — I10 Essential (primary) hypertension: Secondary | ICD-10-CM | POA: Diagnosis not present

## 2014-09-15 DIAGNOSIS — K219 Gastro-esophageal reflux disease without esophagitis: Secondary | ICD-10-CM | POA: Diagnosis not present

## 2014-09-15 DIAGNOSIS — I493 Ventricular premature depolarization: Secondary | ICD-10-CM | POA: Insufficient documentation

## 2014-09-15 DIAGNOSIS — Z79899 Other long term (current) drug therapy: Secondary | ICD-10-CM | POA: Diagnosis not present

## 2014-09-15 DIAGNOSIS — M1990 Unspecified osteoarthritis, unspecified site: Secondary | ICD-10-CM | POA: Diagnosis not present

## 2014-09-15 DIAGNOSIS — I499 Cardiac arrhythmia, unspecified: Secondary | ICD-10-CM | POA: Diagnosis present

## 2014-09-15 DIAGNOSIS — Z8639 Personal history of other endocrine, nutritional and metabolic disease: Secondary | ICD-10-CM | POA: Insufficient documentation

## 2014-09-15 DIAGNOSIS — Z85850 Personal history of malignant neoplasm of thyroid: Secondary | ICD-10-CM | POA: Diagnosis not present

## 2014-09-15 DIAGNOSIS — Z87448 Personal history of other diseases of urinary system: Secondary | ICD-10-CM | POA: Insufficient documentation

## 2014-09-15 DIAGNOSIS — E876 Hypokalemia: Secondary | ICD-10-CM

## 2014-09-15 LAB — CBC WITH DIFFERENTIAL/PLATELET
Basophils Absolute: 0 10*3/uL (ref 0.0–0.1)
Basophils Relative: 0 % (ref 0–1)
Eosinophils Absolute: 0.2 10*3/uL (ref 0.0–0.7)
Eosinophils Relative: 1 % (ref 0–5)
HCT: 41.4 % (ref 36.0–46.0)
Hemoglobin: 13.9 g/dL (ref 12.0–15.0)
Lymphocytes Relative: 37 % (ref 12–46)
Lymphs Abs: 4.4 10*3/uL — ABNORMAL HIGH (ref 0.7–4.0)
MCH: 29.5 pg (ref 26.0–34.0)
MCHC: 33.6 g/dL (ref 30.0–36.0)
MCV: 87.9 fL (ref 78.0–100.0)
Monocytes Absolute: 1 10*3/uL (ref 0.1–1.0)
Monocytes Relative: 8 % (ref 3–12)
Neutro Abs: 6.4 10*3/uL (ref 1.7–7.7)
Neutrophils Relative %: 54 % (ref 43–77)
Platelets: 318 10*3/uL (ref 150–400)
RBC: 4.71 MIL/uL (ref 3.87–5.11)
RDW: 13.5 % (ref 11.5–15.5)
WBC: 11.9 10*3/uL — ABNORMAL HIGH (ref 4.0–10.5)

## 2014-09-15 LAB — BASIC METABOLIC PANEL
Anion gap: 6 (ref 5–15)
BUN: 17 mg/dL (ref 6–23)
CO2: 29 mmol/L (ref 19–32)
Calcium: 9 mg/dL (ref 8.4–10.5)
Chloride: 98 mmol/L (ref 96–112)
Creatinine, Ser: 0.93 mg/dL (ref 0.50–1.10)
GFR calc Af Amer: 73 mL/min — ABNORMAL LOW (ref >90)
GFR calc non Af Amer: 63 mL/min — ABNORMAL LOW (ref >90)
Glucose, Bld: 99 mg/dL (ref 70–99)
Potassium: 3 mmol/L — ABNORMAL LOW (ref 3.5–5.1)
Sodium: 133 mmol/L — ABNORMAL LOW (ref 135–145)

## 2014-09-15 LAB — TROPONIN I: Troponin I: <0.03 ng/mL (ref <0.031)

## 2014-09-15 MED ORDER — POTASSIUM CHLORIDE ER 10 MEQ PO TBCR: EXTENDED_RELEASE_TABLET | ORAL | Status: DC

## 2014-09-15 NOTE — ED Provider Notes (Signed)
CSN: [id number]     Arrival date & time 09/15/14  1129 History   First MD Initiated Contact with Patient 09/15/14 1149     Chief Complaint  Patient presents with  . Irregular Heart Beat     (Consider location/radiation/quality/duration/timing/severity/associated sxs/prior Treatment) HPI Comments: Patient is a 64 year old female with history of thyroidectomy, hypertension. She presents with complaints of palpitations that have been occurring intermittently for the past week. She notices these at night and this seems to be keeping her up. She does report increased stress recently and increased caffeine intake. She denies any chest pain nor shortness of breath.  Patient is a 64 y.o. female presenting with palpitations. The history is provided by the patient.  Palpitations Palpitations quality:  Irregular Onset quality:  Gradual Duration:  1 week Timing:  Intermittent Progression:  Worsening Chronicity:  New Relieved by:  Nothing Worsened by:  Nothing Ineffective treatments:  None tried Associated symptoms: no chest pain and no shortness of breath     Past Medical History  Diagnosis Date  . Thyroid disease   . Arthritis   . Cancer     thyroid (2002)  . Cystic breast   . Hyperlipidemia   . Menopause   . GERD (gastroesophageal reflux disease)   . Migraine   . Hypertension    Past Surgical History  Procedure Laterality Date  . Cholecystectomy    . Appendectomy    . Cesarean section    . Knee arthroplasty    . Carpal tunnel release     Family History  Problem Relation Age of Onset  . Stroke Mother   . Arthritis Mother   . Diabetes Father   . Heart disease Father   . Cancer Sister 33    pancreatic  . Heart disease Maternal Grandmother   . Asthma Maternal Grandfather   . Dementia Paternal Grandmother   . Diabetes Paternal Grandfather    History  Substance Use Topics  . Smoking status: Never Smoker   . Smokeless tobacco: Never Used  . Alcohol Use: No   OB  History    Gravida Para Term Preterm AB TAB SAB Ectopic Multiple Living   2         2     Review of Systems  Respiratory: Negative for shortness of breath.   Cardiovascular: Positive for palpitations. Negative for chest pain.  All other systems reviewed and are negative.     Allergies  Codeine; Levaquin; and Penicillins  Home Medications   Prior to Admission medications   Medication Sig Start Date End Date Taking? Authorizing Provider  acetaminophen (TYLENOL) 325 MG tablet Take 650 mg by mouth every 6 (six) hours as needed.    Historical Provider, MD  albuterol [facility] HFA) 108 (90 BASE) MCG/ACT inhaler Inhale 2 inhaltions TID prn 12/08/13   Lanice Shirts, MD  b complex-C-folic acid 1 MG capsule Take 1 capsule by mouth daily.    Historical Provider, MD  Cholecalciferol ([employer]S VIT D 5000 HIGH-POTENCY PO) Take 5,000 Units by mouth.    Historical Provider, MD  fluticasone (FLONASE) 50 MCG/ACT nasal spray Place 2 sprays into both nostrils daily. 10/01/13   Lanice Shirts, MD  Fluticasone-Salmeterol (ADVAIR DISKUS) 100-50 MCG/DOSE AEPB Inhale 1 puff into the lungs 2 (two) times daily. One inhalation bid until pollen gone then once daily 10/01/13   Lanice Shirts, MD  ibuprofen (ADVIL,MOTRIN) 200 MG tablet Take 200 mg by mouth every 6 (six) hours as needed.  Historical Provider, MD  loratadine (CLARITIN) 10 MG tablet Take 10 mg by mouth daily.    Historical Provider, MD  nystatin-triamcinolone ointment (MYCOLOG) Apply 1 application topically 2 (two) times daily. 07/01/14   Lanice Shirts, MD  OVER THE COUNTER MEDICATION Potassium    Historical Provider, MD  pantoprazole (PROTONIX) 40 MG tablet TAKE 1 TABLET BY MOUTH EVERY DAY 07/09/14   Lanice Shirts, MD  pseudoephedrine-acetaminophen (TYLENOL SINUS) 30-500 MG TABS Take 1 tablet by mouth every 4 (four) hours as needed.    Historical Provider, MD  SYNTHROID 150 MCG tablet TAKE 1 TABLET BY MOUTH EVERY DAY BEFORE  BREAKFAST 07/25/14   Philemon Kingdom, MD  triamterene-hydrochlorothiazide (MAXZIDE) 75-50 MG per tablet TAKE 1 TABLET BY MOUTH EVERY DAY 08/27/14   Lanice Shirts, MD  zinc gluconate 50 MG tablet Take 100 mg by mouth daily.    Historical Provider, MD   BP 161/77 mmHg  Pulse 80  Temp(Src) 97.7 F (36.5 C) (Oral)  Resp 20  Ht 5' 2.5" (1.588 m)  Wt 218 lb (98.884 kg)  BMI 39.21 kg/m2  SpO2 99% Physical Exam  Constitutional: She is oriented to person, place, and time. She appears well-developed and well-nourished. No distress.  HENT:  Head: Normocephalic and atraumatic.  Neck: Normal range of motion. Neck supple.  Cardiovascular: Normal rate and regular rhythm.  Exam reveals no gallop and no friction rub.   No murmur heard. Pulmonary/Chest: Effort normal and breath sounds normal. No respiratory distress. She has no wheezes.  Abdominal: Soft. Bowel sounds are normal. She exhibits no distension. There is no tenderness.  Musculoskeletal: Normal range of motion. She exhibits no edema.  Neurological: She is alert and oriented to person, place, and time.  Skin: Skin is warm and dry. She is not diaphoretic.  Nursing note and vitals reviewed.   ED Course  Procedures (including critical care time) Labs Review Labs Reviewed  BASIC METABOLIC PANEL  CBC WITH DIFFERENTIAL/PLATELET  TROPONIN I    Imaging Review No results found.   EKG Interpretation   Date/Time:  Monday September 15 2014 11:35:52 EST Ventricular Rate:  77 PR Interval:  158 QRS Duration: 90 QT Interval:  402 QTC Calculation: 454 R Axis:   29 Text Interpretation:  Normal sinus rhythm Normal ECG Confirmed by DELOS   MD, Nethan Caudillo ([id number]) on 09/15/2014 11:50:25 AM      MDM   Final diagnoses:  None    Patient presents here with complaints of palpitations for the past week. On exam her heart is regular rate and rhythm, however she has had frequent PVCs on her cardiac monitoring. Her EKG is unremarkable otherwise  and laboratory studies are negative. She does have a potassium of 3.0 which may or may not be a contributing factor to these PVCs. She will be treated with potassium replacement and follow-up with her primary doctor.    Veryl Speak, MD 09/15/14 1315

## 2014-09-15 NOTE — Discharge Instructions (Signed)
Potassium as prescribed.  Follow up with your primary Dr. in one week for a recheck of your potassium level.  Avoid caffeine.   Premature Ventricular Contraction Premature ventricular contraction (PVC) is an irregularity of the heart rhythm involving extra or skipped heartbeats. In some cases, they may occur without obvious cause or heart disease. Other times, they can be caused by an electrolyte change in the blood. These need to be corrected. They can also be seen when there is not enough oxygen going to the heart. A common cause of this is plaque or cholesterol buildup. This buildup decreases the blood supply to the heart. In addition, extra beats may be caused or aggravated by:  Excessive smoking.  Alcohol consumption.  Caffeine.  Certain medications  Some street drugs. SYMPTOMS   The sensation of feeling your heart skipping a beat (palpitations).  In many cases, the person may have no symptoms. SIGNS AND TESTS   A physical examination may show an occasional irregularity, but if the PVC beats do not happen often, they may not be found on physical exam.  Blood pressure is usually normal.  Other tests that may find extra beats of the heart are:  An EKG (electrocardiogram)  A Holter monitor which can monitor your heart over longer periods of time  An Angiogram (study of the heart arteries). TREATMENT  Usually extra heartbeats do not need treatment. The condition is treated only if symptoms are severe or if extra beats are very frequent or are causing problems. An underlying cause, if discovered, may also require treatment.  Treatment may also be needed if there may be a risk for other more serious cardiac arrhythmias.  PREVENTION   Moderation in caffeine, alcohol, and tobacco use may reduce the risk of ectopic heartbeats in some people.  Exercise often helps people who lead a sedentary (inactive) lifestyle. PROGNOSIS  PVC heartbeats are generally harmless and do not  need treatment.  RISKS AND COMPLICATIONS   Ventricular tachycardia (occasionally).  There usually are no complications.  Other arrhythmias (occasionally). SEEK IMMEDIATE MEDICAL CARE IF:   You feel palpitations that are frequent or continual.  You develop chest pain or other problems such as shortness of breath, sweating, or nausea and vomiting.  You become light-headed or faint (pass out).  You get worse or do not improve with treatment. Document Released: 02/19/2004 Document Revised: 09/26/2011 Document Reviewed: 08/31/2007 [facility] Patient Information 2015 [place], [place]. This information is not intended to replace advice given to you by your health care provider. Make sure you discuss any questions you have with your health care provider.    Palpitations A palpitation is the feeling that your heartbeat is irregular or is faster than normal. It may feel like your heart is fluttering or skipping a beat. Palpitations are usually not a serious problem. However, in some cases, you may need further medical evaluation. CAUSES  Palpitations can be caused by:  Smoking.  Caffeine or other stimulants, such as diet pills or energy drinks.  Alcohol.  Stress and anxiety.  Strenuous physical activity.  Fatigue.  Certain medicines.  Heart disease, especially if you have a history of irregular heart rhythms (arrhythmias), such as atrial fibrillation, atrial flutter, or supraventricular tachycardia.  An improperly working pacemaker or defibrillator. DIAGNOSIS  To find the cause of your palpitations, your health care provider will take your medical history and perform a physical exam. Your health care provider may also have you take a test called an ambulatory electrocardiogram (ECG). An ECG  records your heartbeat patterns over a 24-hour period. You may also have other tests, such as:  Transthoracic echocardiogram (TTE). During echocardiography, sound waves are used to evaluate how  blood flows through your heart.  Transesophageal echocardiogram (TEE).  Cardiac monitoring. This allows your health care provider to monitor your heart rate and rhythm in real time.  Holter monitor. This is a portable device that records your heartbeat and can help diagnose heart arrhythmias. It allows your health care provider to track your heart activity for several days, if needed.  Stress tests by exercise or by giving medicine that makes the heart beat faster. TREATMENT  Treatment of palpitations depends on the cause of your symptoms and can vary greatly. Most cases of palpitations do not require any treatment other than time, relaxation, and monitoring your symptoms. Other causes, such as atrial fibrillation, atrial flutter, or supraventricular tachycardia, usually require further treatment. HOME CARE INSTRUCTIONS   Avoid:  Caffeinated coffee, tea, soft drinks, diet pills, and energy drinks.  Chocolate.  Alcohol.  Stop smoking if you smoke.  Reduce your stress and anxiety. Things that can help you relax include:  A method of controlling things in your body, such as your heartbeats, with your mind (biofeedback).  Yoga.  Meditation.  Physical activity such as swimming, jogging, or walking.  Get plenty of rest and sleep. SEEK MEDICAL CARE IF:   You continue to have a fast or irregular heartbeat beyond 24 hours.  Your palpitations occur more often. SEEK IMMEDIATE MEDICAL CARE IF:  You have chest pain or shortness of breath.  You have a severe headache.  You feel dizzy or you faint. MAKE SURE YOU:  Understand these instructions.  Will watch your condition.  Will get help right away if you are not doing well or get worse. Document Released: 07/01/2000 Document Revised: 07/09/2013 Document Reviewed: 09/02/2011 [facility] Patient Information 2015 [place], [place]. This information is not intended to replace advice given to you by your health care provider. Make sure  you discuss any questions you have with your health care provider.  Hypokalemia Hypokalemia means that the amount of potassium in the blood is lower than normal.Potassium is a chemical, called an electrolyte, that helps regulate the amount of fluid in the body. It also stimulates muscle contraction and helps nerves function properly.Most of the body's potassium is inside of cells, and only a very small amount is in the blood. Because the amount in the blood is so small, minor changes can be life-threatening. CAUSES  Antibiotics.  Diarrhea or vomiting.  Using laxatives too much, which can cause diarrhea.  Chronic kidney disease.  Water pills (diuretics).  Eating disorders (bulimia).  Low magnesium level.  Sweating a lot. SIGNS AND SYMPTOMS  Weakness.  Constipation.  Fatigue.  Muscle cramps.  Mental confusion.  Skipped heartbeats or irregular heartbeat (palpitations).  Tingling or numbness. DIAGNOSIS  Your health care provider can diagnose hypokalemia with blood tests. In addition to checking your potassium level, your health care provider may also check other lab tests. TREATMENT Hypokalemia can be treated with potassium supplements taken by mouth or adjustments in your current medicines. If your potassium level is very low, you may need to get potassium through a vein (IV) and be monitored in the hospital. A diet high in potassium is also helpful. Foods high in potassium are:  Nuts, such as peanuts and pistachios.  Seeds, such as sunflower seeds and pumpkin seeds.  Peas, lentils, and lima beans.  Whole grain and bran  cereals and breads.  Fresh fruit and vegetables, such as apricots, avocado, bananas, cantaloupe, kiwi, oranges, tomatoes, asparagus, and potatoes.  Orange and tomato juices.  Red meats.  Fruit yogurt. HOME CARE INSTRUCTIONS  Take all medicines as prescribed by your health care provider.  Maintain a healthy diet by including nutritious food,  such as fruits, vegetables, nuts, whole grains, and lean meats.  If you are taking a laxative, be sure to follow the directions on the label. SEEK MEDICAL CARE IF:  Your weakness gets worse.  You feel your heart pounding or racing.  You are vomiting or having diarrhea.  You are diabetic and having trouble keeping your blood glucose in the normal range. SEEK IMMEDIATE MEDICAL CARE IF:  You have chest pain, shortness of breath, or dizziness.  You are vomiting or having diarrhea for more than 2 days.  You faint. MAKE SURE YOU:   Understand these instructions.  Will watch your condition.  Will get help right away if you are not doing well or get worse. Document Released: 07/04/2005 Document Revised: 04/24/2013 Document Reviewed: 01/04/2013 [facility] Patient Information 2015 [place], [place]. This information is not intended to replace advice given to you by your health care provider. Make sure you discuss any questions you have with your health care provider.

## 2014-09-15 NOTE — ED Notes (Signed)
64 yo c/o irregular heart beat. States she feels like her heart is "running away". Denies chest pain. More tired than normal.

## 2014-09-16 ENCOUNTER — Telehealth: Payer: Self-pay | Admitting: Internal Medicine

## 2014-09-16 NOTE — Telephone Encounter (Signed)
Monica Wallace   Call pt and have her see me on Thursday  30 min visit for ER follow up

## 2014-09-17 NOTE — Telephone Encounter (Signed)
Appointment has been made for [facility] for Thursday.-eh

## 2014-09-18 ENCOUNTER — Encounter: Payer: Self-pay | Admitting: Internal Medicine

## 2014-09-18 ENCOUNTER — Ambulatory Visit (INDEPENDENT_AMBULATORY_CARE_PROVIDER_SITE_OTHER): Payer: BLUE CROSS/BLUE SHIELD | Admitting: Internal Medicine

## 2014-09-18 VITALS — BP 161/76 | HR 85 | Resp 16 | Ht 62.5 in | Wt 219.0 lb

## 2014-09-18 DIAGNOSIS — E876 Hypokalemia: Secondary | ICD-10-CM

## 2014-09-18 DIAGNOSIS — I493 Ventricular premature depolarization: Secondary | ICD-10-CM

## 2014-09-18 LAB — MAGNESIUM: Magnesium: 1.8 mg/dL (ref 1.5–2.5)

## 2014-09-18 LAB — COMPREHENSIVE METABOLIC PANEL
ALT: 19 U/L (ref 0–35)
AST: 21 U/L (ref 0–37)
Albumin: 3.9 g/dL (ref 3.5–5.2)
Alkaline Phosphatase: 96 U/L (ref 39–117)
BUN: 16 mg/dL (ref 6–23)
CO2: 29 mEq/L (ref 19–32)
Calcium: 9 mg/dL (ref 8.4–10.5)
Chloride: 99 mEq/L (ref 96–112)
Creat: 0.81 mg/dL (ref 0.50–1.10)
Glucose, Bld: 117 mg/dL — ABNORMAL HIGH (ref 70–99)
Potassium: 3.6 mEq/L (ref 3.5–5.3)
Sodium: 139 mEq/L (ref 135–145)
Total Bilirubin: 0.2 mg/dL (ref 0.2–1.2)
Total Protein: 6.6 g/dL (ref 6.0–8.3)

## 2014-09-18 NOTE — Progress Notes (Signed)
Subjective:    Patient ID: Monica Wallace, female    DOB: 11/25/1950, 64 y.o.   MRN: [id number]  HPI  09/15/2014 ER note MDM   Final diagnoses:  None    Patient presents here with complaints of palpitations for the past week. On exam her heart is regular rate and rhythm, however she has had frequent PVCs on her cardiac monitoring. Her EKG is unremarkable otherwise and laboratory studies are negative. She does have a potassium of 3.0 which may or may not be a contributing factor to these PVCs. She will be treated with potassium replacement and follow-up with her primary doctor.    Veryl Speak, MD 09/15/14 [place] is here for ER follow up  See above  ER ekg  NSR no ectopy   She has been on 20 me K bid since ER visit.  She is on a K sparing diuretic.   "felt pretty good today "  Some palpitations at night.  She is not drinking any caffiene  No dizziness or pre-syncope with her symptoms  Allergies  Allergen Reactions  . Codeine   . Levaquin [Levofloxacin]   . Penicillins    Past Medical History  Diagnosis Date  . Thyroid disease   . Arthritis   . Cancer     thyroid (2002)  . Cystic breast   . Hyperlipidemia   . Menopause   . GERD (gastroesophageal reflux disease)   . Migraine   . Hypertension    Past Surgical History  Procedure Laterality Date  . Cholecystectomy    . Appendectomy    . Cesarean section    . Knee arthroplasty    . Carpal tunnel release     History   Social History  . Marital Status: Divorced    Spouse Name: N/A  . Number of Children: N/A  . Years of Education: N/A   Occupational History  . Not on file.   Social History Main Topics  . Smoking status: Never Smoker   . Smokeless tobacco: Never Used  . Alcohol Use: No  . Drug Use: No  . Sexual Activity: No     Comment: Hysterectomy   Other Topics Concern  . Not on file   Social History Narrative   Family History  Problem Relation Age of Onset  . Stroke  Mother   . Arthritis Mother   . Diabetes Father   . Heart disease Father   . Cancer Sister 84    pancreatic  . Heart disease Maternal Grandmother   . Asthma Maternal Grandfather   . Dementia Paternal Grandmother   . Diabetes Paternal Grandfather    Patient Active Problem List   Diagnosis Date Noted  . Plantar fasciitis of right foot 11/19/2013  . Tenosynovitis of foot and ankle 11/19/2013  . Ankle edema 11/19/2013  . Pronation deformity of ankle, acquired 11/19/2013  . DJD (degenerative joint disease) 08/26/2013  . Fibrocystic breast 08/26/2013  . HTN (hypertension) 08/26/2013  . GERD (gastroesophageal reflux disease) 08/26/2013  . S/P hysterectomy with oophorectomy 08/26/2013  . Menopause 08/26/2013  . Hypothyroidism 08/26/2013  . Chronic hoarseness 08/26/2013  . MVP (mitral valve prolapse) 08/26/2013  . Colon polyp 08/26/2013  . Hiatal hernia 08/26/2013  . Irritable bowel syndrome 08/26/2013  . Asthma 08/26/2013  . Allergic rhinitis 08/26/2013  . Hyperlipidemia 08/22/2013  . Migraine 08/22/2013  . Thyroid cancer 08/22/2013   Current Outpatient Prescriptions on File Prior to  Visit  Medication Sig Dispense Refill  . acetaminophen (TYLENOL) 325 MG tablet Take 650 mg by mouth every 6 (six) hours as needed.    Marland Kitchen albuterol (PROAIR HFA) 108 (90 BASE) MCG/ACT inhaler Inhale 2 inhaltions TID prn 8.5 g 0  . b complex-C-folic acid 1 MG capsule Take 1 capsule by mouth daily.    . Cholecalciferol ([employer]S VIT D 5000 HIGH-POTENCY PO) Take 5,000 Units by mouth.    . fluticasone (FLONASE) 50 MCG/ACT nasal spray Place 2 sprays into both nostrils daily. 16 g 1  . Fluticasone-Salmeterol (ADVAIR DISKUS) 100-50 MCG/DOSE AEPB Inhale 1 puff into the lungs 2 (two) times daily. One inhalation bid until pollen gone then once daily 60 each 2  . ibuprofen (ADVIL,MOTRIN) 200 MG tablet Take 200 mg by mouth every 6 (six) hours as needed.    . loratadine (CLARITIN) 10 MG tablet Take 10 mg by mouth daily.     Marland Kitchen nystatin-triamcinolone ointment (MYCOLOG) Apply 1 application topically 2 (two) times daily. 30 g 0  . OVER THE COUNTER MEDICATION Potassium    . pantoprazole (PROTONIX) 40 MG tablet TAKE 1 TABLET BY MOUTH EVERY DAY 90 tablet 1  . potassium chloride (K-DUR) 10 MEQ tablet Take 20 mg twice daily until gone. 30 tablet 0  . pseudoephedrine-acetaminophen (TYLENOL SINUS) 30-500 MG TABS Take 1 tablet by mouth every 4 (four) hours as needed.    Marland Kitchen SYNTHROID 150 MCG tablet TAKE 1 TABLET BY MOUTH EVERY DAY BEFORE BREAKFAST 60 tablet 0  . triamterene-hydrochlorothiazide (MAXZIDE) 75-50 MG per tablet TAKE 1 TABLET BY MOUTH EVERY DAY 90 tablet 1  . zinc gluconate 50 MG tablet Take 100 mg by mouth daily.     No current facility-administered medications on file prior to visit.      Review of Systems    see HPI Objective:   Physical Exam  Physical Exam  Nursing note and vitals reviewed.  Constitutional: She is oriented to person, place, and time. She appears well-developed and well-nourished.  HENT:  Head: Normocephalic and atraumatic.  Cardiovascular: Normal rate and regular rhythm. Exam reveals no gallop and no friction rub.  No murmur heard.  Pulmonary/Chest: Breath sounds normal. She has no wheezes. She has no rales.  Neurological: She is alert and oriented to person, place, and time.  Skin: Skin is warm and dry.  Psychiatric: She has a normal mood and affect. Her behavior is normal.             Assessment & Plan:  Subjective palpitations/  Pvc's  Will need holter   Has f/u with cardiolgy  Will also check Mg++ today   Minimal hypokalemia  Will check today   Further management based on REsults  HTN:  Continue Maxzide

## 2014-09-19 ENCOUNTER — Ambulatory Visit (INDEPENDENT_AMBULATORY_CARE_PROVIDER_SITE_OTHER): Payer: BLUE CROSS/BLUE SHIELD | Admitting: Cardiovascular Disease

## 2014-09-19 ENCOUNTER — Encounter: Payer: Self-pay | Admitting: Cardiovascular Disease

## 2014-09-19 VITALS — BP 142/82 | HR 64 | Ht 62.5 in | Wt 216.9 lb

## 2014-09-19 DIAGNOSIS — R002 Palpitations: Secondary | ICD-10-CM

## 2014-09-19 DIAGNOSIS — E785 Hyperlipidemia, unspecified: Secondary | ICD-10-CM

## 2014-09-19 DIAGNOSIS — I10 Essential (primary) hypertension: Secondary | ICD-10-CM | POA: Diagnosis not present

## 2014-09-19 NOTE — Assessment & Plan Note (Signed)
The patient has mild hyperlipidemia with recent lab work revealing total cholesterol 193, LDL 123 and HDL 47.

## 2014-09-19 NOTE — Progress Notes (Signed)
09/19/2014 Myrtice Lauth   1951-06-23  [id number]  Primary Physician SCHOENHOFF,DEBBIE, MD Primary Cardiologist: Lorretta Harp MD Renae Gloss   HPI:  Mrs. Mccalip is a 64 year old moderately overweight divorced African-American female mother of 2 children, grandmother and 2 grandchildren referred by Dr. Coralyn Mark  for evaluation of palpitations. She has a remote history of mitral valve prolapse documented 35 years ago along with PVCs at that time. She works as a Midwife at [employer]. Her history otherwise is remarkable for treated hypertension. She's had a thyroidectomy back in 2002 for thyroid cancer and has been on thyroid replacement therapy since followed by her endocrinologist and primary care physician. She also has a history of GERD. She was seen in [facility] [place] emergency room for PVCs and palpitations. She is noted to have potassium of 3.0 was placed on K replacement. She has recently stopped her caffeine intake. She's also noticed increased fatigue and has symptoms compatible with obstructive sleep apnea.   Current Outpatient Prescriptions  Medication Sig Dispense Refill  . acetaminophen (TYLENOL) 325 MG tablet Take 650 mg by mouth every 6 (six) hours as needed.    Marland Kitchen albuterol (PROAIR HFA) 108 (90 BASE) MCG/ACT inhaler Inhale 2 inhaltions TID prn 8.5 g 0  . b complex-C-folic acid 1 MG capsule Take 1 capsule by mouth daily.    . Cholecalciferol (CVS VIT D 5000 HIGH-POTENCY PO) Take 5,000 Units by mouth.    . fluticasone (FLONASE) 50 MCG/ACT nasal spray Place 2 sprays into both nostrils daily. 16 g 1  . Fluticasone-Salmeterol (ADVAIR DISKUS) 100-50 MCG/DOSE AEPB Inhale 1 puff into the lungs 2 (two) times daily. One inhalation bid until pollen gone then once daily 60 each 2  . ibuprofen (ADVIL,MOTRIN) 200 MG tablet Take 200 mg by mouth every 6 (six) hours as needed.    . loratadine (CLARITIN) 10 MG tablet Take 10 mg by mouth daily.    Marland Kitchen  nystatin-triamcinolone ointment (MYCOLOG) Apply 1 application topically 2 (two) times daily. 30 g 0  . OVER THE COUNTER MEDICATION Potassium    . pantoprazole (PROTONIX) 40 MG tablet TAKE 1 TABLET BY MOUTH EVERY DAY 90 tablet 1  . potassium chloride (K-DUR) 10 MEQ tablet Take 20 mg twice daily until gone. 30 tablet 0  . pseudoephedrine-acetaminophen (TYLENOL SINUS) 30-500 MG TABS Take 1 tablet by mouth every 4 (four) hours as needed.    Marland Kitchen SYNTHROID 150 MCG tablet TAKE 1 TABLET BY MOUTH EVERY DAY BEFORE BREAKFAST 60 tablet 0  . triamterene-hydrochlorothiazide (MAXZIDE) 75-50 MG per tablet TAKE 1 TABLET BY MOUTH EVERY DAY 90 tablet 1  . zinc gluconate 50 MG tablet Take 100 mg by mouth daily.     No current facility-administered medications for this visit.    Allergies  Allergen Reactions  . Codeine   . Levaquin [Levofloxacin]   . Penicillins     History   Social History  . Marital Status: Divorced    Spouse Name: N/A  . Number of Children: N/A  . Years of Education: N/A   Occupational History  . Not on file.   Social History Main Topics  . Smoking status: Never Smoker   . Smokeless tobacco: Never Used  . Alcohol Use: No  . Drug Use: No  . Sexual Activity: No     Comment: Hysterectomy   Other Topics Concern  . Not on file   Social History Narrative     Review of Systems: General: negative  for chills, fever, night sweats or weight changes.  Cardiovascular: negative for chest pain, dyspnea on exertion, edema, orthopnea, palpitations, paroxysmal nocturnal dyspnea or shortness of breath Dermatological: negative for rash Respiratory: negative for cough or wheezing Urologic: negative for hematuria Abdominal: negative for nausea, vomiting, diarrhea, bright red blood per rectum, melena, or hematemesis Neurologic: negative for visual changes, syncope, or dizziness All other systems reviewed and are otherwise negative except as noted above.    Blood pressure 142/82, pulse  64, height 5' 2.5" (1.588 m), weight 216 lb 14.4 oz (98.385 kg).  General appearance: alert and no distress Neck: no adenopathy, no carotid bruit, no JVD, supple, symmetrical, trachea midline and thyroid not enlarged, symmetric, no tenderness/mass/nodules Lungs: clear to auscultation bilaterally Heart: regular rate and rhythm, S1, S2 normal, no murmur, click, rub or gallop Extremities: extremities normal, atraumatic, no cyanosis or edema  EKG not performed today  ASSESSMENT AND PLAN:   Palpitations Mrs. Frankson was referred by Dr. Coralyn Mark for evaluation of palpitations. She has a remote history of mitral valve prolapse eating by 35 years however recent 2-D echo performed in January did not show this. She also had palpitations packed and demonstrated on Holter monitor. She was recently seen in the ER this past Monday for palpitations and was noted to be have PVCs. She's had a remote thyroidectomy for cancer and has her thyroid functions carefully monitored by her primary care physician and endocrinologist. She does admit to some symptoms compatible with obstructive sleep apnea. She also admits to being under last stress personally and professionally. She has recently limited her caffeine intake.  I am going to obtain a 1 week event monitor as well as an outpatient sleep study. We talked about limiting her stress. I will see her back in 4-6 weeks.   Hyperlipidemia The patient has mild hyperlipidemia with recent lab work revealing total cholesterol 193, LDL 123 and HDL 47.   HTN (hypertension) History of hypertension with blood pressure measured at 142/82 on Dyazide. Continue current meds at current dosing. She was noted to have crit hypokalemia in the ER recently measured at 3.0 was put on potassium replacement.       Lorretta Harp MD FACP,FACC,FAHA, [facility] 09/19/2014 9:27 AM

## 2014-09-19 NOTE — Assessment & Plan Note (Signed)
History of hypertension with blood pressure measured at 142/82 on Dyazide. Continue current meds at current dosing. She was noted to have crit hypokalemia in the ER recently measured at 3.0 was put on potassium replacement.

## 2014-09-19 NOTE — Patient Instructions (Signed)
Your physician has recommended that you wear an event monitor. Event monitors are medical devices that record the heart's electrical activity. Doctors most often [place] these monitors to diagnose arrhythmias. Arrhythmias are problems with the speed or rhythm of the heartbeat. The monitor is a small, portable device. You can wear one while you do your normal daily activities. This is used to diagnose what is causing palpitations. You will wear this monitor for 1 week.  Your physician has recommended that you have a sleep study. This test records several body functions during sleep, including: brain activity, eye movement, oxygen and carbon dioxide blood levels, heart rate and rhythm, breathing rate and rhythm, the flow of air through your mouth and nose, snoring, body muscle movements, and chest and belly movement.  Your physician recommends that you schedule a follow-up appointment in: 4-6 weeks with Dr.Berry.

## 2014-09-19 NOTE — Assessment & Plan Note (Addendum)
Monica Wallace was referred by Dr. Coralyn Mark for evaluation of palpitations. She has a remote history of mitral valve prolapse eating by 35 years however recent 2-D echo performed in January did not show this. She also had palpitations packed and demonstrated on Holter monitor. She was recently seen in the ER this past Monday for palpitations and was noted to be have PVCs. She's had a remote thyroidectomy for cancer and has her thyroid functions carefully monitored by her primary care physician and endocrinologist. She does admit to some symptoms compatible with obstructive sleep apnea. She also admits to being under last stress personally and professionally. She has recently limited her caffeine intake.  I am going to obtain a 1 week event monitor as well as an outpatient sleep study. We talked about limiting her stress. I will see her back in 4-6 weeks.

## 2014-09-22 ENCOUNTER — Other Ambulatory Visit: Payer: Self-pay | Admitting: Internal Medicine

## 2014-09-22 MED ORDER — POTASSIUM CHLORIDE ER 10 MEQ PO TBCR
EXTENDED_RELEASE_TABLET | ORAL | Status: DC
Start: 2014-09-22 — End: 2015-05-19

## 2014-09-23 ENCOUNTER — Other Ambulatory Visit: Payer: Self-pay | Admitting: Internal Medicine

## 2014-09-24 ENCOUNTER — Ambulatory Visit (HOSPITAL_BASED_OUTPATIENT_CLINIC_OR_DEPARTMENT_OTHER): Payer: BLUE CROSS/BLUE SHIELD | Attending: Cardiology | Admitting: Radiology

## 2014-09-24 VITALS — Ht 62.5 in | Wt 212.0 lb

## 2014-09-24 DIAGNOSIS — R002 Palpitations: Secondary | ICD-10-CM

## 2014-09-24 DIAGNOSIS — R0683 Snoring: Secondary | ICD-10-CM | POA: Insufficient documentation

## 2014-09-24 DIAGNOSIS — G4733 Obstructive sleep apnea (adult) (pediatric): Secondary | ICD-10-CM

## 2014-09-30 ENCOUNTER — Ambulatory Visit: Payer: BLUE CROSS/BLUE SHIELD | Admitting: Internal Medicine

## 2014-10-01 NOTE — Sleep Study (Signed)
NAME: Monica Wallace DATE OF BIRTH:  [birth year] MEDICAL RECORD NUMBER [id number]  LOCATION: [facility]  PHYSICIAN: Virtie Bungert A  DATE OF STUDY: 09/24/2014  SLEEP STUDY TYPE: Nocturnal Polysomnogram               REFERRING PHYSICIAN: Lelon Perla, MD   INDICATION FOR STUDY:  [name] is a 64 year old female with a history of obesity, hypertension, palpitations, who is referred for sleep study to evaluate for sleep apnea.  She complains of awakening gasping for breath, snoring, daytime fatigue, and nonrestorative sleep.  EPWORTH SLEEPINESS SCORE:  10 HEIGHT: 5' 2.5" (158.8 cm)  WEIGHT: 212 lb (96.163 kg)    Body mass index is 38.13 kg/(m^2).  NECK SIZE: 14 in.  MEDICATIONS:  Medications zinc gluconate 50 MG tablet 100 mg, Daily triamterene-hydrochlorothiazide (MAXZIDE) 75-50 MG per tablet SYNTHROID 150 MCG tablet pseudoephedrine-acetaminophen (TYLENOL SINUS) 30-500 MG TABS 1 tablet, Every 4 hours PRN potassium chloride (K-DUR) 10 MEQ tablet pantoprazole (PROTONIX) 40 MG tablet OVER THE COUNTER MEDICATION nystatin-triamcinolone ointment (MYCOLOG) 1 application, 2 times daily loratadine (CLARITIN) 10 MG tablet 10 mg, Daily ibuprofen (ADVIL,MOTRIN) 200 MG tablet 200 mg, Every 6 hours PRN Fluticasone-Salmeterol (ADVAIR DISKUS) 100-50 MCG/DOSE AEPB 1 puff, 2 times daily fluticasone (FLONASE) 50 MCG/ACT nasal spray 2 spray, Daily Cholecalciferol (CVS VIT D 5000 HIGH-POTENCY PO) 5,000 Units b complex-C-folic acid 1 MG capsule 1 capsule, Daily albuterol (PROAIR HFA) 108 (90 BASE) MCG/ACT inhaler acetaminophen (TYLENOL) 325 MG tablet 650 mg, Every 6 hours PRN    SLEEP ARCHITECTURE:  The patient slept for 311.5 minutes out of a sleep period of time of 400 minutes.  Percent sleep efficiency is widely reduced at 75.8%.  Sleep latency was normal at 10 minutes.  Latency to REM sleep was 74 minutes.  The patient slept for 21.5 minutes in stage I (6.9%), 222.5 minutes in  stage II (71.4%), 0 minutes in stage III, and 67.5 minutes in REM sleep (21.7%).  The patient slept for 25.5 minutes (8.2%) in supine sleep.  There were total of 66 arousals with an index of 12.7, which is mildly elevated.  RESPIRATORY DATA:  During the sleep.  She had a total of 8 obstructive apneas, 0 central apneas, 0 mixed apneas, and 27  Hypopneas.  The overall apnea plus hypopnea index (AHI) is 6.7/hr and a respiratory disturbance index (RDI) is 7.9/hr.  However, with REM sleep the AHI was 28.4 per hour.  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  There was mild to moderate snoring.   OXYGEN DATA:  The baseline oxygen saturation was 97%.  The lowest oxygen desaturation with non-REM sleep was 91%.  However, the oxygen saturation nadir with REM sleep was 80%.  CARDIAC DATA:  The average heart rate was 76 bpm with sinus rhythm.  There were rare PVCs.  MOVEMENT/PARASOMNIA:  There were 0 periodic limb movements.  IMPRESSION/ RECOMMENDATION:   Mild obstructive sleep apnea/hypoxia syndrome.  However, events were moderately severe during REM sleep. Respiratory events with frequent oxygen desaturation to a nadir of 80% during REM sleep. Mildly reduced sleep efficiency. Mild to moderate snoring. No evidence for nocturnal myoclonus. Arousal index was mildly abnormal. Rare isolated PVCs.  In this patient with cardiovascular comorbidities CPAP titration is recommended in light of the severity of the patient's sleep apnea/hypopnea syndrome particularly during REM sleep. Effort should be made to optimize nasal and  oral pharyngeal patency.   The patient should be counseled both in good sleep hygiene as well as weight loss, particularly with a body mass index of 38.  [place], American Board of Sleep Medicine  ELECTRONICALLY SIGNED ON:  10/01/2014, 5:57 PM [place] PH: [phone]   FX: [phone] [place]

## 2014-10-09 ENCOUNTER — Telehealth: Payer: Self-pay | Admitting: *Deleted

## 2014-10-09 DIAGNOSIS — G4733 Obstructive sleep apnea (adult) (pediatric): Secondary | ICD-10-CM

## 2014-10-09 NOTE — Telephone Encounter (Signed)
-----   Message from Troy Sine, MD sent at 10/01/2014  6:12 PM EDT ----- [name] please set up for CPAP titration

## 2014-10-09 NOTE — Telephone Encounter (Signed)
Called to inform patient of sleep study results and recommendations. Titration study ordered.

## 2014-10-13 ENCOUNTER — Encounter: Payer: Self-pay | Admitting: *Deleted

## 2014-10-21 DIAGNOSIS — K5900 Constipation, unspecified: Secondary | ICD-10-CM | POA: Diagnosis not present

## 2014-10-21 DIAGNOSIS — K219 Gastro-esophageal reflux disease without esophagitis: Secondary | ICD-10-CM | POA: Diagnosis not present

## 2014-10-21 DIAGNOSIS — R140 Abdominal distension (gaseous): Secondary | ICD-10-CM | POA: Diagnosis not present

## 2014-10-22 ENCOUNTER — Ambulatory Visit: Payer: BLUE CROSS/BLUE SHIELD | Admitting: Cardiology

## 2014-10-29 ENCOUNTER — Ambulatory Visit: Payer: BLUE CROSS/BLUE SHIELD | Admitting: Cardiovascular Disease

## 2014-11-04 ENCOUNTER — Encounter: Payer: Self-pay | Admitting: Internal Medicine

## 2014-11-04 ENCOUNTER — Ambulatory Visit (INDEPENDENT_AMBULATORY_CARE_PROVIDER_SITE_OTHER): Payer: BLUE CROSS/BLUE SHIELD | Admitting: Internal Medicine

## 2014-11-04 VITALS — BP 140/84 | HR 77 | Temp 98.0°F | Ht 62.5 in | Wt 220.0 lb

## 2014-11-04 DIAGNOSIS — C73 Malignant neoplasm of thyroid gland: Secondary | ICD-10-CM

## 2014-11-04 DIAGNOSIS — E890 Postprocedural hypothyroidism: Secondary | ICD-10-CM | POA: Diagnosis not present

## 2014-11-04 NOTE — Patient Instructions (Signed)
Please stop at the lab.  Please return in 1 year.  

## 2014-11-04 NOTE — Progress Notes (Signed)
Patient ID: Monica Wallace, female   DOB: 02/18/1951, 64 y.o.   MRN: [id number]   HPI  Monica Wallace is a 64 y.o.-year-old female, returning for f/u for thyroid cancer and postsurgical hypothyroidism. She moved here from [place] in 03/2013. Last visit with the endocrinologist in [place] (Dr. Blenda Mounts). Last visit 1 year ago.  Since last visit >> had a torn tendon last summer.  She was seen by Dr. Collene Mares for GERD and IBS.  She saw Dr. Gwenlyn Found for palpitations (stress) >> wore a Holter monitor; stopped caffeine; had a sleep study >> OSA >> will test the CPAP.   She also found out she has prediabetes.  ThyCA hx: Pt. has been dx with thyroid cancer in 2002 >> total thyroidectomy >> encapsulated >> low grade >> RAI tx >> repeated whole body scans for 3-4 years >> after that just ultrasounds >> recent U/S: normal, no recurrence.  Received records from Dr Donney Rankins (Endo, [place], [place]): 11/04/1996: Thyroid ultrasound: Right thyroid lobe complex nodule, 1.7 x 1.9 x 3.4 cm, solid, with a small cystic component. 10/25/2000: Thyroidectomy - No pathology records available for review. 12/18/2000: RAI Tx 100 mCi I131 12/28/2000: Posttreatment whole-body scan: Residual functioning thyroid tissue in the thyroid bed in the midline and extending towards the left. No distant metastases identified. 02/16/2001: Thyroid ultrasound: multinodular goiter with a very large right thyroid nodule (3.6 x 2.3 x 1.7 cm)No  12/05/2001: whole-body scan: Negative  05/24/2004: whole-body scan: Negative  Thyroglobulin 0.3, ATA <20 01/04/2005: TSH 0.08 on Synthroid 150 mcg at bedtime, thyroglobulin <0.20, ATA <20 01/31/2006 : TSH 0.44806/02/2009: TSH 1.77, thyroglobulin <0.20,  ATA<20 08/02/2006: Thyrogen stimulated I-123 whole body scan: Normal physiologic uptake in nares, salivary glands, gastrointestinal tract and urinary tract. No abnormal uptake indicating any functioning thyroid tissue. TSH <0.2, on Synthroid 137 mcg at bedtime,  Thyroglobulin <0.20, ATA 16.96 08/04/2006: TSH 16.96, thyroglobulin <0.20,  ATA<20 10/22/2007: TSH 1.67, thyroglobulin <0.20,  ATA<20 ________________ 08/20/2013: TSH 0.10 (Synthroid was decreased from 175 to 150 g), thyroglobulin <0.20,  ATA<20 08/23/2013: Thyroid ultrasound: No evidence of residual or recurrent tissue post thyroidectomy. 07/01/2014: TSH 0.997  She was dx'ed with a goiter and Hashimoto's hypothyroidism in her 64s. She has been on Levothyroxine since then.   She is on 150 mcg Synhtroid DAW, taken: - fasting - with water - separated by 2h from b'fast  - takes PPI at nighttime - no Ca, iron, MVI  I reviewed pt's thyroid tests: Lab Results  Component Value Date   TSH 0.997 07/01/2014   TSH 0.10* 11/18/2013   TSH 0.500 08/30/2013   FREET4 1.26 11/18/2013   FREET4 1.55 08/30/2013    Pt denies feeling nodules in neck, + hoarseness (2/2 GERD), dysphagia/odynophagia, SOB with lying down.  Pt describes: - + Fatigue  - no cold intolerance - + weight gain  - + constipation/+ diarrhea  - IBS/+ heartburn - no dry skin - no hair loss  - no depression/no anxiety  ROS: Constitutional: see above Eyes: no blurry vision, no xerophthalmia ENT: no sore throat, no nodules palpated in throat, no dysphagia/odynophagia, + hoarseness Cardiovascular: no CP/SOB/+ palpitations/+ leg swelling Respiratory: + cough/no SOB/wheezing Gastrointestinal: no N/ V/+ D/+ C, + heartburn Musculoskeletal: no muscle aches/+ joint aches Skin: no rashes Neurological: no tremors/numbness/tingling/dizziness Psychiatric: no depression/anxiety  Past Medical History  Diagnosis Date  . Thyroid disease   . Arthritis   . Cancer     thyroid (2002)  . Cystic breast   . Hyperlipidemia   .  Menopause   . GERD (gastroesophageal reflux disease)   . Migraine   . Hypertension   . Palpitations    Past Surgical History  Procedure Laterality Date  . Cholecystectomy    . Appendectomy    . Cesarean  section    . Knee arthroplasty    . Carpal tunnel release     History   Social History  . 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 Smoking status: Never Smoker   . Smokeless tobacco: Not on file  . Alcohol Use: No  . Drug Use: Pt denies now, in the past: yes - per chart    Special: Cocaine   Current Outpatient Prescriptions on File Prior to Visit  Medication Sig Dispense Refill  . acetaminophen (TYLENOL) 325 MG tablet Take 650 mg by mouth every 6 (six) hours as needed.    Marland Kitchen albuterol (PROAIR HFA) 108 (90 BASE) MCG/ACT inhaler Inhale 2 inhaltions TID prn 8.5 g 0  . b complex-C-folic acid 1 MG capsule Take 1 capsule by mouth daily.    . Cholecalciferol (CVS VIT D 5000 HIGH-POTENCY PO) Take 5,000 Units by mouth.    . fluticasone (FLONASE) 50 MCG/ACT nasal spray Place 2 sprays into both nostrils daily. 16 g 1  . Fluticasone-Salmeterol (ADVAIR DISKUS) 100-50 MCG/DOSE AEPB Inhale 1 puff into the lungs 2 (two) times daily. One inhalation bid until pollen gone then once daily 60 each 2  . ibuprofen (ADVIL,MOTRIN) 200 MG tablet Take 200 mg by mouth every 6 (six) hours as needed.    . loratadine (CLARITIN) 10 MG tablet Take 10 mg by mouth daily.    Marland Kitchen nystatin-triamcinolone ointment (MYCOLOG) Apply 1 application topically 2 (two) times daily. 30 g 0  . OVER THE COUNTER MEDICATION Potassium    . pantoprazole (PROTONIX) 40 MG tablet TAKE 1 TABLET BY MOUTH EVERY DAY 90 tablet 1  . potassium chloride (K-DUR) 10 MEQ tablet Take two tablets once a day 60 tablet 3  . pseudoephedrine-acetaminophen (TYLENOL SINUS) 30-500 MG TABS Take 1 tablet by mouth every 4 (four) hours as needed.    Marland Kitchen SYNTHROID 150 MCG tablet TAKE 1 TABLET BY MOUTH EVERY DAY BEFORE BREAKFAST 60 tablet 0  . triamterene-hydrochlorothiazide (MAXZIDE) 75-50 MG per tablet TAKE 1 TABLET BY MOUTH EVERY DAY 90 tablet 1  . zinc  gluconate 50 MG tablet Take 100 mg by mouth daily.     No current facility-administered medications on file prior to visit.   Allergies  Allergen Reactions  . Codeine   . Levaquin [Levofloxacin]   . Penicillins    Family History  Problem Relation Age of Onset  . Stroke Mother   . Arthritis Mother   . Diabetes Father   . Heart disease Father   . Cancer Sister 3    pancreatic  . Heart disease Maternal Grandmother   . Asthma Maternal Grandfather   . Dementia Paternal Grandmother   . Diabetes Paternal Grandfather    PE: BP 140/84 mmHg  Pulse 77  Temp(Src) 98 F (36.7 C) (Oral)  Ht 5' 2.5" (1.588 m)  Wt 220 lb (99.791 kg)  BMI 39.57 kg/m2  SpO2 96% Wt Readings from Last 3 Encounters:  11/04/14 220 lb (99.791 kg)  09/24/14 212 lb (96.163 kg)  09/19/14 216 lb 14.4 oz (98.385 kg)   Constitutional: overweight, in  NAD Eyes: PERRLA, EOMI, no exophthalmos ENT: moist mucous membranes, no cervical lymphadenopathy, healed cervical scar Cardiovascular: RRR, No MRG Respiratory: CTA B Gastrointestinal: abdomen soft, NT, ND, BS+ Musculoskeletal: no deformities, strength intact in all 4 Skin: moist, warm, no rashes Neurological: no tremor with outstretched hands, DTR normal in all 4  ASSESSMENT: 1. Papillary Thyroid cancer - dx 2002, s/p total thyroidectomy, s/p RAI tx, subsequent WBS's negative - records reviewed  2. Hypothyroidism  PLAN:  1. And 2. Patient with long-standing hypothyroidism after thyroidectomy for PTC, on Synthroid therapy. She appears euthyroid.  - We discussed about correct intake of levothyroxine, fasting, with water, separated by at least 30 minutes from breakfast, and separated by more than 4 hours from calcium, iron, multivitamins, acid reflux medications (PPIs). She is taking it correctly now. Before last visit, she was taking PPIs in a.m. - She does not appear to have a goiter, thyroid nodules, or neck compression symptoms.  - We reviewed together her  previous TSH and thyroglobulin levels. Her thyroglobulin levels have been undetectable. This is a great sign. - we'll check thyroid tests today: TSH, free T4, Tg and ATA - target TSH: normal range - If these are abnormal, she will need to return in 5-8 weeks for repeat labs - If these are normal, I will see her back in 1 year  She needs a refill when the labs are back.  Office Visit on 11/04/2014  Component Date Value Ref Range Status  . Thyroglobulin Ab 11/04/2014 <1  <2 IU/mL Final  . Thyroglobulin 11/04/2014 <0.1* 2.8 - 40.9 ng/mL Final   Comment: Thyroglobulin antibodies (TGAb) interfere with Thyroglobulin (TG) assays; therefore, Thyroglobulin antibody (TGAb) assay should always be performed in conjunction with a Thyroglobulin (TG) assay.   This test was performed using the Beckman Coulter chemiluminescent method.  Values obtained from different assay methods cannot be used interchangeably.  Thyroglobulin levels, regardless of value, should not be interpreted as absolute evidence of the presence or absence of disease.   Marland Kitchen TSH 11/04/2014 1.39  0.35 - 4.50 uIU/mL Final  . Free T4 11/04/2014 1.05  0.60 - 1.60 ng/dL Final   Excellent results! Continue Synthroid 150 mcg daily.

## 2014-11-05 LAB — T4, FREE: Free T4: 1.05 ng/dL (ref 0.60–1.60)

## 2014-11-05 LAB — TSH: TSH: 1.39 u[IU]/mL (ref 0.35–4.50)

## 2014-11-06 ENCOUNTER — Encounter: Payer: Self-pay | Admitting: *Deleted

## 2014-11-06 LAB — THYROGLOBULIN ANTIBODY: Thyroglobulin Ab: <1 IU/mL (ref <2)

## 2014-11-06 LAB — THYROGLOBULIN LEVEL: Thyroglobulin: <0.1 ng/mL — ABNORMAL LOW (ref 2.8–40.9)

## 2014-11-15 MED ORDER — SYNTHROID 150 MCG PO TABS: ORAL_TABLET | ORAL | Status: DC

## 2014-12-10 ENCOUNTER — Encounter (HOSPITAL_BASED_OUTPATIENT_CLINIC_OR_DEPARTMENT_OTHER): Payer: BLUE CROSS/BLUE SHIELD

## 2014-12-14 DIAGNOSIS — J042 Acute laryngotracheitis: Secondary | ICD-10-CM | POA: Diagnosis not present

## 2014-12-16 ENCOUNTER — Ambulatory Visit: Payer: BLUE CROSS/BLUE SHIELD | Admitting: Cardiovascular Disease

## 2015-01-30 ENCOUNTER — Telehealth: Payer: Self-pay | Admitting: *Deleted

## 2015-01-30 NOTE — Telephone Encounter (Signed)
Unable to reach patient at time of Pre-Visit Call.  Left message for patient to return call when available.    

## 2015-02-02 ENCOUNTER — Ambulatory Visit (INDEPENDENT_AMBULATORY_CARE_PROVIDER_SITE_OTHER): Payer: BLUE CROSS/BLUE SHIELD | Admitting: Family Medicine

## 2015-02-02 ENCOUNTER — Ambulatory Visit (HOSPITAL_BASED_OUTPATIENT_CLINIC_OR_DEPARTMENT_OTHER): Payer: BLUE CROSS/BLUE SHIELD | Attending: Cardiovascular Disease | Admitting: Radiology

## 2015-02-02 ENCOUNTER — Encounter: Payer: Self-pay | Admitting: Family Medicine

## 2015-02-02 VITALS — BP 120/72 | HR 76 | Temp 98.1°F | Ht 62.5 in | Wt 219.0 lb

## 2015-02-02 DIAGNOSIS — I10 Essential (primary) hypertension: Secondary | ICD-10-CM | POA: Diagnosis not present

## 2015-02-02 DIAGNOSIS — R002 Palpitations: Secondary | ICD-10-CM

## 2015-02-02 DIAGNOSIS — K219 Gastro-esophageal reflux disease without esophagitis: Secondary | ICD-10-CM

## 2015-02-02 DIAGNOSIS — J4520 Mild intermittent asthma, uncomplicated: Secondary | ICD-10-CM

## 2015-02-02 DIAGNOSIS — G4730 Sleep apnea, unspecified: Secondary | ICD-10-CM

## 2015-02-02 DIAGNOSIS — G4733 Obstructive sleep apnea (adult) (pediatric): Secondary | ICD-10-CM | POA: Diagnosis not present

## 2015-02-02 DIAGNOSIS — K7689 Other specified diseases of liver: Secondary | ICD-10-CM | POA: Insufficient documentation

## 2015-02-02 DIAGNOSIS — E890 Postprocedural hypothyroidism: Secondary | ICD-10-CM | POA: Diagnosis not present

## 2015-02-02 DIAGNOSIS — E785 Hyperlipidemia, unspecified: Secondary | ICD-10-CM | POA: Diagnosis not present

## 2015-02-02 DIAGNOSIS — Z780 Asymptomatic menopausal state: Secondary | ICD-10-CM

## 2015-02-02 HISTORY — DX: Sleep apnea, unspecified: G47.30

## 2015-02-02 HISTORY — DX: Other specified diseases of liver: K76.89

## 2015-02-02 MED ORDER — SYNTHROID 150 MCG PO TABS: ORAL_TABLET | ORAL | Status: DC

## 2015-02-02 MED ORDER — TRIAMTERENE-HCTZ 75-50 MG PO TABS: 1.0000 | ORAL_TABLET | Freq: Every day | ORAL | Status: DC

## 2015-02-02 NOTE — Assessment & Plan Note (Signed)
Seasonal, no recent flares.

## 2015-02-02 NOTE — Progress Notes (Signed)
Monica Wallace  [id number] 19-Nov-1950 02/02/2015      Progress Note-Follow Up  Subjective  Chief Complaint  Chief Complaint  Patient presents with  . Establish Care    HPI  Patient is a 64 y.o. female in today for routine medical care. She is in today to establish care and has a PMH that includes HTN, palpitations, Thyroid disease and hyperlipidemia among others.  No recent illness. Denies CP/palp/SOB/HA/congestion/fevers/GI or GU c/o. Taking meds as prescribed  Past Medical History  Diagnosis Date  . Thyroid disease   . Arthritis   . Cancer     thyroid (2002)  . Cystic breast   . Hyperlipidemia   . Menopause   . GERD (gastroesophageal reflux disease)   . Migraine   . Hypertension   . Palpitations     Past Surgical History  Procedure Laterality Date  . Cholecystectomy    . Appendectomy    . Cesarean section    . Knee arthroplasty    . Carpal tunnel release      Family History  Problem Relation Age of Onset  . Stroke Mother   . Arthritis Mother   . Diabetes Father   . Heart disease Father   . Cancer Sister 75    pancreatic  . Heart disease Maternal Grandmother   . Asthma Maternal Grandfather   . Dementia Paternal Grandmother   . Diabetes Paternal Grandfather     History   Social History  . Marital Status: Divorced    Spouse Name: N/A  . Number of Children: N/A  . Years of Education: N/A   Occupational History  . Not on file.   Social History Main Topics  . Smoking status: Never Smoker   . Smokeless tobacco: Never Used  . Alcohol Use: No  . Drug Use: No  . Sexual Activity: No     Comment: Hysterectomy   Other Topics Concern  . Not on file   Social History Narrative    Current Outpatient Prescriptions on File Prior to Visit  Medication Sig Dispense Refill  . acetaminophen (TYLENOL) 325 MG tablet Take 650 mg by mouth every 6 (six) hours as needed.    Marland Kitchen albuterol (PROAIR HFA) 108 (90 BASE) MCG/ACT inhaler Inhale 2 inhaltions TID prn 8.5 g  0  . b complex-C-folic acid 1 MG capsule Take 1 capsule by mouth daily.    . Cholecalciferol (CVS VIT D 5000 HIGH-POTENCY PO) Take 5,000 Units by mouth.    . fluticasone (FLONASE) 50 MCG/ACT nasal spray Place 2 sprays into both nostrils daily. 16 g 1  . Fluticasone-Salmeterol (ADVAIR DISKUS) 100-50 MCG/DOSE AEPB Inhale 1 puff into the lungs 2 (two) times daily. One inhalation bid until pollen gone then once daily 60 each 2  . ibuprofen (ADVIL,MOTRIN) 200 MG tablet Take 200 mg by mouth every 6 (six) hours as needed.    . loratadine (CLARITIN) 10 MG tablet Take 10 mg by mouth daily.    Marland Kitchen nystatin-triamcinolone ointment (MYCOLOG) Apply 1 application topically 2 (two) times daily. 30 g 0  . OVER THE COUNTER MEDICATION Potassium    . pantoprazole (PROTONIX) 40 MG tablet TAKE 1 TABLET BY MOUTH EVERY DAY 90 tablet 1  . potassium chloride (K-DUR) 10 MEQ tablet Take two tablets once a day 60 tablet 3  . pseudoephedrine-acetaminophen (TYLENOL SINUS) 30-500 MG TABS Take 1 tablet by mouth every 4 (four) hours as needed.    Marland Kitchen SYNTHROID 150 MCG tablet TAKE 1 TABLET BY MOUTH  EVERY DAY BEFORE BREAKFAST 90 tablet 3  . triamterene-hydrochlorothiazide (MAXZIDE) 75-50 MG per tablet TAKE 1 TABLET BY MOUTH EVERY DAY 90 tablet 1  . zinc gluconate 50 MG tablet Take 100 mg by mouth daily.     No current facility-administered medications on file prior to visit.    Allergies  Allergen Reactions  . Codeine   . Levaquin [Levofloxacin]   . Penicillins     Review of Systems  Review of Systems  Constitutional: Negative for fever, chills and malaise/fatigue.  HENT: Negative for congestion, hearing loss and nosebleeds.   Eyes: Negative for discharge.  Respiratory: Negative for cough, sputum production, shortness of breath and wheezing.   Cardiovascular: Negative for chest pain, palpitations and leg swelling.  Gastrointestinal: Negative for heartburn, nausea, vomiting, abdominal pain, diarrhea, constipation and blood  in stool.  Genitourinary: Negative for dysuria, urgency, frequency and hematuria.  Musculoskeletal: Negative for myalgias, back pain and falls.  Skin: Negative for rash.  Neurological: Negative for dizziness, tremors, sensory change, focal weakness, loss of consciousness, weakness and headaches.  Endo/Heme/Allergies: Negative for polydipsia. Does not bruise/bleed easily.  Psychiatric/Behavioral: Negative for depression and suicidal ideas. The patient is not nervous/anxious and does not have insomnia.     Objective  BP 120/72 mmHg  Pulse 76  Temp(Src) 98.1 F (36.7 C) (Oral)  Ht 5' 2.5" (1.588 m)  Wt 219 lb (99.338 kg)  BMI 39.39 kg/m2  SpO2 97%  Physical Exam  Physical Exam  Constitutional: She is oriented to person, place, and time and well-developed, well-nourished, and in no distress. No distress.  HENT:  Head: Normocephalic and atraumatic.  Right Ear: External ear normal.  Left Ear: External ear normal.  Nose: Nose normal.  Mouth/Throat: Oropharynx is clear and moist. No oropharyngeal exudate.  Eyes: Conjunctivae are normal. Pupils are equal, round, and reactive to light. Right eye exhibits no discharge. Left eye exhibits no discharge. No scleral icterus.  Neck: Normal range of motion. Neck supple. No thyromegaly present.  Cardiovascular: Normal rate, regular rhythm, normal heart sounds and intact distal pulses.   No murmur heard. Pulmonary/Chest: Effort normal and breath sounds normal. No respiratory distress. She has no wheezes. She has no rales.  Abdominal: Soft. Bowel sounds are normal. She exhibits no distension and no mass. There is no tenderness.  Musculoskeletal: Normal range of motion. She exhibits no edema or tenderness.  Lymphadenopathy:    She has no cervical adenopathy.  Neurological: She is alert and oriented to person, place, and time. She has normal reflexes. No cranial nerve deficit. Coordination normal.  Skin: Skin is warm and dry. No rash noted. She is  not diaphoretic.  Psychiatric: Mood, memory and affect normal.    Lab Results  Component Value Date   TSH 1.39 11/04/2014   Lab Results  Component Value Date   WBC 11.9* 09/15/2014   HGB 13.9 09/15/2014   HCT 41.4 09/15/2014   MCV 87.9 09/15/2014   PLT 318 09/15/2014   Lab Results  Component Value Date   CREATININE 0.81 09/18/2014   BUN 16 09/18/2014   NA 139 09/18/2014   K 3.6 09/18/2014   CL 99 09/18/2014   CO2 29 09/18/2014   Lab Results  Component Value Date   ALT 19 09/18/2014   AST 21 09/18/2014   ALKPHOS 96 09/18/2014   BILITOT 0.2 09/18/2014   Lab Results  Component Value Date   CHOL 193 07/01/2014   Lab Results  Component Value Date   HDL 47  07/01/2014   Lab Results  Component Value Date   LDLCALC 123* 07/01/2014   Lab Results  Component Value Date   TRIG 115 07/01/2014   Lab Results  Component Value Date   CHOLHDL 4.1 07/01/2014     Assessment & Plan  Hypothyroidism On Levothyroxine, continue to monitor  HTN (hypertension) Well controlled, no changes to meds. Encouraged heart healthy diet such as the DASH diet and exercise as tolerated.   Hyperlipidemia Encouraged heart healthy diet, increase exercise, avoid trans fats, consider a krill oil cap daily  Asthma Seasonal, no recent flares.  GERD (gastroesophageal reflux disease) Avoid offending foods, start probiotics. Do not eat large meals in late evening and consider raising head of bed. Flares her asthma when it occurs. Consider silent heartburn due to hoarseness  Palpitations No recent episodes, reminded to minimize caffeine

## 2015-02-02 NOTE — Assessment & Plan Note (Addendum)
Avoid offending foods, start probiotics. Do not eat large meals in late evening and consider raising head of bed. Flares her asthma when it occurs. Consider silent heartburn due to hoarseness

## 2015-02-02 NOTE — Assessment & Plan Note (Signed)
Encouraged heart healthy diet, increase exercise, avoid trans fats, consider a krill oil cap daily 

## 2015-02-02 NOTE — Patient Instructions (Signed)
Consider a probiotic such as [employer]age or online at [email or web address], NOW company has a 10 strain version daily Salon Pas gelPreventive Care for Adults A healthy lifestyle and preventive care can promote health and wellness. Preventive health guidelines for women include the following key practices.  A routine yearly physical is a good way to check with your health care provider about your health and preventive screening. It is a chance to share any concerns and updates on your health and to receive a thorough exam.  Visit your dentist for a routine exam and preventive care every 6 months. Brush your teeth twice a day and floss once a day. Good oral hygiene prevents tooth decay and gum disease.  The frequency of eye exams is based on your age, health, family medical history, use of contact lenses, and other factors. Follow your health care provider's recommendations for frequency of eye exams.  Eat a healthy diet. Foods like vegetables, fruits, whole grains, low-fat dairy products, and lean protein foods contain the nutrients you need without too many calories. Decrease your intake of foods high in solid fats, added sugars, and salt. Eat the right amount of calories for you.Get information about a proper diet from your health care provider, if necessary.  Regular physical exercise is one of the most important things you can do for your health. Most adults should get at least 150 minutes of moderate-intensity exercise (any activity that increases your heart rate and causes you to sweat) each week. In addition, most adults need muscle-strengthening exercises on 2 or more days a week.  Maintain a healthy weight. The body mass index (BMI) is a screening tool to identify possible weight problems. It provides an estimate of body fat based on height and weight. Your health care provider can find your BMI and can help you achieve or maintain a healthy weight.For adults 20 years and older:  A BMI  below 18.5 is considered underweight.  A BMI of 18.5 to 24.9 is normal.  A BMI of 25 to 29.9 is considered overweight.  A BMI of 30 and above is considered obese.  Maintain normal blood lipids and cholesterol levels by exercising and minimizing your intake of saturated fat. Eat a balanced diet with plenty of fruit and vegetables. Blood tests for lipids and cholesterol should begin at age 20 and be repeated every 5 years. If your lipid or cholesterol levels are high, you are over 50, or you are at high risk for heart disease, you may need your cholesterol levels checked more frequently.Ongoing high lipid and cholesterol levels should be treated with medicines if diet and exercise are not working.  If you smoke, find out from your health care provider how to quit. If you do not use tobacco, do not start.  Lung cancer screening is recommended for adults aged 55-80 years who are at high risk for developing lung cancer because of a history of smoking. A yearly low-dose CT scan of the lungs is recommended for people who have at least a 30-pack-year history of smoking and are a current smoker or have quit within the past 15 years. A pack year of smoking is smoking an average of 1 pack of cigarettes a day for 1 year (for example: 1 pack a day for 30 years or 2 packs a day for 15 years). Yearly screening should continue until the smoker has stopped smoking for at least 15 years. Yearly screening should be stopped for people who develop a health problem that   would prevent them from having lung cancer treatment.  If you are pregnant, do not drink alcohol. If you are breastfeeding, be very cautious about drinking alcohol. If you are not pregnant and choose to drink alcohol, do not have more than 1 drink per day. One drink is considered to be 12 ounces (355 mL) of beer, 5 ounces (148 mL) of wine, or 1.5 ounces (44 mL) of liquor.  Avoid use of street drugs. Do not share needles with anyone. Ask for help if you  need support or instructions about stopping the use of drugs.  High blood pressure causes heart disease and increases the risk of stroke. Your blood pressure should be checked at least every 1 to 2 years. Ongoing high blood pressure should be treated with medicines if weight loss and exercise do not work.  If you are 55-79 years old, ask your health care provider if you should take aspirin to prevent strokes.  Diabetes screening involves taking a blood sample to check your fasting blood sugar level. This should be done once every 3 years, after age 45, if you are within normal weight and without risk factors for diabetes. Testing should be considered at a younger age or be carried out more frequently if you are overweight and have at least 1 risk factor for diabetes.  Breast cancer screening is essential preventive care for women. You should practice "breast self-awareness." This means understanding the normal appearance and feel of your breasts and may include breast self-examination. Any changes detected, no matter how small, should be reported to a health care provider. Women in their 20s and 30s should have a clinical breast exam (CBE) by a health care provider as part of a regular health exam every 1 to 3 years. After age 40, women should have a CBE every year. Starting at age 40, women should consider having a mammogram (breast X-ray test) every year. Women who have a family history of breast cancer should talk to their health care provider about genetic screening. Women at a high risk of breast cancer should talk to their health care providers about having an MRI and a mammogram every year.  Breast cancer gene (BRCA)-related cancer risk assessment is recommended for women who have family members with BRCA-related cancers. BRCA-related cancers include breast, ovarian, tubal, and peritoneal cancers. Having family members with these cancers may be associated with an increased risk for harmful changes  (mutations) in the breast cancer genes BRCA1 and BRCA2. Results of the assessment will determine the need for genetic counseling and BRCA1 and BRCA2 testing.  Routine pelvic exams to screen for cancer are no longer recommended for nonpregnant women who are considered low risk for cancer of the pelvic organs (ovaries, uterus, and vagina) and who do not have symptoms. Ask your health care provider if a screening pelvic exam is right for you.  If you have had past treatment for cervical cancer or a condition that could lead to cancer, you need Pap tests and screening for cancer for at least 20 years after your treatment. If Pap tests have been discontinued, your risk factors (such as having a new sexual partner) need to be reassessed to determine if screening should be resumed. Some women have medical problems that increase the chance of getting cervical cancer. In these cases, your health care provider may recommend more frequent screening and Pap tests.  The HPV test is an additional test that may be used for cervical cancer screening. The HPV test looks   for the virus that can cause the cell changes on the cervix. The cells collected during the Pap test can be tested for HPV. The HPV test could be used to screen women aged 30 years and older, and should be used in women of any age who have unclear Pap test results. After the age of 30, women should have HPV testing at the same frequency as a Pap test.  Colorectal cancer can be detected and often prevented. Most routine colorectal cancer screening begins at the age of 50 years and continues through age 75 years. However, your health care provider may recommend screening at an earlier age if you have risk factors for colon cancer. On a yearly basis, your health care provider may provide home test kits to check for hidden blood in the stool. Use of a small camera at the end of a tube, to directly examine the colon (sigmoidoscopy or colonoscopy), can detect the  earliest forms of colorectal cancer. Talk to your health care provider about this at age 50, when routine screening begins. Direct exam of the colon should be repeated every 5-10 years through age 75 years, unless early forms of pre-cancerous polyps or small growths are found.  People who are at an increased risk for hepatitis B should be screened for this virus. You are considered at high risk for hepatitis B if:  You were born in a country where hepatitis B occurs often. Talk with your health care provider about which countries are considered high risk.  Your parents were born in a high-risk country and you have not received a shot to protect against hepatitis B (hepatitis B vaccine).  You have HIV or AIDS.  You use needles to inject street drugs.  You live with, or have sex with, someone who has hepatitis B.  You get hemodialysis treatment.  You take certain medicines for conditions like cancer, organ transplantation, and autoimmune conditions.  Hepatitis C blood testing is recommended for all people born from 1945 through 1965 and any individual with known risks for hepatitis C.  Practice safe sex. Use condoms and avoid high-risk sexual practices to reduce the spread of sexually transmitted infections (STIs). STIs include gonorrhea, chlamydia, syphilis, trichomonas, herpes, HPV, and human immunodeficiency virus (HIV). Herpes, HIV, and HPV are viral illnesses that have no cure. They can result in disability, cancer, and death.  You should be screened for sexually transmitted illnesses (STIs) including gonorrhea and chlamydia if:  You are sexually active and are younger than 24 years.  You are older than 24 years and your health care provider tells you that you are at risk for this type of infection.  Your sexual activity has changed since you were last screened and you are at an increased risk for chlamydia or gonorrhea. Ask your health care provider if you are at risk.  If you are  at risk of being infected with HIV, it is recommended that you take a prescription medicine daily to prevent HIV infection. This is called preexposure prophylaxis (PrEP). You are considered at risk if:  You are a heterosexual woman, are sexually active, and are at increased risk for HIV infection.  You take drugs by injection.  You are sexually active with a partner who has HIV.  Talk with your health care provider about whether you are at high risk of being infected with HIV. If you choose to begin PrEP, you should first be tested for HIV. You should then be tested every 3 months   for as long as you are taking PrEP.  Osteoporosis is a disease in which the bones lose minerals and strength with aging. This can result in serious bone fractures or breaks. The risk of osteoporosis can be identified using a bone density scan. Women ages 65 years and over and women at risk for fractures or osteoporosis should discuss screening with their health care providers. Ask your health care provider whether you should take a calcium supplement or vitamin D to reduce the rate of osteoporosis.  Menopause can be associated with physical symptoms and risks. Hormone replacement therapy is available to decrease symptoms and risks. You should talk to your health care provider about whether hormone replacement therapy is right for you.  Use sunscreen. Apply sunscreen liberally and repeatedly throughout the day. You should seek shade when your shadow is shorter than you. Protect yourself by wearing long sleeves, pants, a wide-brimmed hat, and sunglasses year round, whenever you are outdoors.  Once a month, do a whole body skin exam, using a mirror to look at the skin on your back. Tell your health care provider of new moles, moles that have irregular borders, moles that are larger than a pencil eraser, or moles that have changed in shape or color.  Stay current with required vaccines (immunizations).  Influenza vaccine.  All adults should be immunized every year.  Tetanus, diphtheria, and acellular pertussis (Td, Tdap) vaccine. Pregnant women should receive 1 dose of Tdap vaccine during each pregnancy. The dose should be obtained regardless of the length of time since the last dose. Immunization is preferred during the 27th-36th week of gestation. An adult who has not previously received Tdap or who does not know her vaccine status should receive 1 dose of Tdap. This initial dose should be followed by tetanus and diphtheria toxoids (Td) booster doses every 10 years. Adults with an unknown or incomplete history of completing a 3-dose immunization series with Td-containing vaccines should begin or complete a primary immunization series including a Tdap dose. Adults should receive a Td booster every 10 years.  Varicella vaccine. An adult without evidence of immunity to varicella should receive 2 doses or a second dose if she has previously received 1 dose. Pregnant females who do not have evidence of immunity should receive the first dose after pregnancy. This first dose should be obtained before leaving the health care facility. The second dose should be obtained 4-8 weeks after the first dose.  Human papillomavirus (HPV) vaccine. Females aged 13-26 years who have not received the vaccine previously should obtain the 3-dose series. The vaccine is not recommended for use in pregnant females. However, pregnancy testing is not needed before receiving a dose. If a female is found to be pregnant after receiving a dose, no treatment is needed. In that case, the remaining doses should be delayed until after the pregnancy. Immunization is recommended for any person with an immunocompromised condition through the age of 26 years if she did not get any or all doses earlier. During the 3-dose series, the second dose should be obtained 4-8 weeks after the first dose. The third dose should be obtained 24 weeks after the first dose and 16  weeks after the second dose.  Zoster vaccine. One dose is recommended for adults aged 60 years or older unless certain conditions are present.  Measles, mumps, and rubella (MMR) vaccine. Adults born before 1957 generally are considered immune to measles and mumps. Adults born in 1957 or later should have 1 or   more doses of MMR vaccine unless there is a contraindication to the vaccine or there is laboratory evidence of immunity to each of the three diseases. A routine second dose of MMR vaccine should be obtained at least 28 days after the first dose for students attending postsecondary schools, health care workers, or international travelers. People who received inactivated measles vaccine or an unknown type of measles vaccine during 1963-1967 should receive 2 doses of MMR vaccine. People who received inactivated mumps vaccine or an unknown type of mumps vaccine before 1979 and are at high risk for mumps infection should consider immunization with 2 doses of MMR vaccine. For females of childbearing age, rubella immunity should be determined. If there is no evidence of immunity, females who are not pregnant should be vaccinated. If there is no evidence of immunity, females who are pregnant should delay immunization until after pregnancy. Unvaccinated health care workers born before 1957 who lack laboratory evidence of measles, mumps, or rubella immunity or laboratory confirmation of disease should consider measles and mumps immunization with 2 doses of MMR vaccine or rubella immunization with 1 dose of MMR vaccine.  Pneumococcal 13-valent conjugate (PCV13) vaccine. When indicated, a person who is uncertain of her immunization history and has no record of immunization should receive the PCV13 vaccine. An adult aged 19 years or older who has certain medical conditions and has not been previously immunized should receive 1 dose of PCV13 vaccine. This PCV13 should be followed with a dose of pneumococcal  polysaccharide (PPSV23) vaccine. The PPSV23 vaccine dose should be obtained at least 8 weeks after the dose of PCV13 vaccine. An adult aged 19 years or older who has certain medical conditions and previously received 1 or more doses of PPSV23 vaccine should receive 1 dose of PCV13. The PCV13 vaccine dose should be obtained 1 or more years after the last PPSV23 vaccine dose.  Pneumococcal polysaccharide (PPSV23) vaccine. When PCV13 is also indicated, PCV13 should be obtained first. All adults aged 65 years and older should be immunized. An adult younger than age 65 years who has certain medical conditions should be immunized. Any person who resides in a nursing home or long-term care facility should be immunized. An adult smoker should be immunized. People with an immunocompromised condition and certain other conditions should receive both PCV13 and PPSV23 vaccines. People with human immunodeficiency virus (HIV) infection should be immunized as soon as possible after diagnosis. Immunization during chemotherapy or radiation therapy should be avoided. Routine use of PPSV23 vaccine is not recommended for American Indians, [place] Natives, or people younger than 65 years unless there are medical conditions that require PPSV23 vaccine. When indicated, people who have unknown immunization and have no record of immunization should receive PPSV23 vaccine. One-time revaccination 5 years after the first dose of PPSV23 is recommended for people aged 19-64 years who have chronic kidney failure, nephrotic syndrome, asplenia, or immunocompromised conditions. People who received 1-2 doses of PPSV23 before age 65 years should receive another dose of PPSV23 vaccine at age 65 years or later if at least 5 years have passed since the previous dose. Doses of PPSV23 are not needed for people immunized with PPSV23 at or after age 65 years.  Meningococcal vaccine. Adults with asplenia or persistent complement component deficiencies  should receive 2 doses of quadrivalent meningococcal conjugate (MenACWY-D) vaccine. The doses should be obtained at least 2 months apart. Microbiologists working with certain meningococcal bacteria, military recruits, people at risk during an outbreak, and people who travel   to or live in countries with a high rate of meningitis should be immunized. A first-year college student up through age 21 years who is living in a residence hall should receive a dose if she did not receive a dose on or after her 16th birthday. Adults who have certain high-risk conditions should receive one or more doses of vaccine.  Hepatitis A vaccine. Adults who wish to be protected from this disease, have certain high-risk conditions, work with hepatitis A-infected animals, work in hepatitis A research labs, or travel to or work in countries with a high rate of hepatitis A should be immunized. Adults who were previously unvaccinated and who anticipate close contact with an international adoptee during the first 60 days after arrival in the [place] from a country with a high rate of hepatitis A should be immunized.  Hepatitis B vaccine. Adults who wish to be protected from this disease, have certain high-risk conditions, may be exposed to blood or other infectious body fluids, are household contacts or sex partners of hepatitis B positive people, are clients or workers in certain care facilities, or travel to or work in countries with a high rate of hepatitis B should be immunized.  Haemophilus influenzae type b (Hib) vaccine. A previously unvaccinated person with asplenia or sickle cell disease or having a scheduled splenectomy should receive 1 dose of Hib vaccine. Regardless of previous immunization, a recipient of a hematopoietic stem cell transplant should receive a 3-dose series 6-12 months after her successful transplant. Hib vaccine is not recommended for adults with HIV infection. Preventive Services / Frequency Ages 19  to 39 years  Blood pressure check.** / Every 1 to 2 years.  Lipid and cholesterol check.** / Every 5 years beginning at age 20.  Clinical breast exam.** / Every 3 years for women in their 20s and 30s.  BRCA-related cancer risk assessment.** / For women who have family members with a BRCA-related cancer (breast, ovarian, tubal, or peritoneal cancers).  Pap test.** / Every 2 years from ages 21 through 29. Every 3 years starting at age 30 through age 65 or 70 with a history of 3 consecutive normal Pap tests.  HPV screening.** / Every 3 years from ages 30 through ages 65 to 70 with a history of 3 consecutive normal Pap tests.  Hepatitis C blood test.** / For any individual with known risks for hepatitis C.  Skin self-exam. / Monthly.  Influenza vaccine. / Every year.  Tetanus, diphtheria, and acellular pertussis (Tdap, Td) vaccine.** / Consult your health care provider. Pregnant women should receive 1 dose of Tdap vaccine during each pregnancy. 1 dose of Td every 10 years.  Varicella vaccine.** / Consult your health care provider. Pregnant females who do not have evidence of immunity should receive the first dose after pregnancy.  HPV vaccine. / 3 doses over 6 months, if 26 and younger. The vaccine is not recommended for use in pregnant females. However, pregnancy testing is not needed before receiving a dose.  Measles, mumps, rubella (MMR) vaccine.** / You need at least 1 dose of MMR if you were born in 1957 or later. You may also need a 2nd dose. For females of childbearing age, rubella immunity should be determined. If there is no evidence of immunity, females who are not pregnant should be vaccinated. If there is no evidence of immunity, females who are pregnant should delay immunization until after pregnancy.  Pneumococcal 13-valent conjugate (PCV13) vaccine.** / Consult your health care provider.  Pneumococcal   polysaccharide (PPSV23) vaccine.** / 1 to 2 doses if you smoke cigarettes  or if you have certain conditions.  Meningococcal vaccine.** / 1 dose if you are age 19 to 21 years and a first-year college student living in a residence hall, or have one of several medical conditions, you need to get vaccinated against meningococcal disease. You may also need additional booster doses.  Hepatitis A vaccine.** / Consult your health care provider.  Hepatitis B vaccine.** / Consult your health care provider.  Haemophilus influenzae type b (Hib) vaccine.** / Consult your health care provider. Ages 40 to 64 years  Blood pressure check.** / Every 1 to 2 years.  Lipid and cholesterol check.** / Every 5 years beginning at age 20 years.  Lung cancer screening. / Every year if you are aged 55-80 years and have a 30-pack-year history of smoking and currently smoke or have quit within the past 15 years. Yearly screening is stopped once you have quit smoking for at least 15 years or develop a health problem that would prevent you from having lung cancer treatment.  Clinical breast exam.** / Every year after age 40 years.  BRCA-related cancer risk assessment.** / For women who have family members with a BRCA-related cancer (breast, ovarian, tubal, or peritoneal cancers).  Mammogram.** / Every year beginning at age 40 years and continuing for as long as you are in good health. Consult with your health care provider.  Pap test.** / Every 3 years starting at age 30 years through age 65 or 70 years with a history of 3 consecutive normal Pap tests.  HPV screening.** / Every 3 years from ages 30 years through ages 65 to 70 years with a history of 3 consecutive normal Pap tests.  Fecal occult blood test (FOBT) of stool. / Every year beginning at age 50 years and continuing until age 75 years. You may not need to do this test if you get a colonoscopy every 10 years.  Flexible sigmoidoscopy or colonoscopy.** / Every 5 years for a flexible sigmoidoscopy or every 10 years for a colonoscopy  beginning at age 50 years and continuing until age 75 years.  Hepatitis C blood test.** / For all people born from 1945 through 1965 and any individual with known risks for hepatitis C.  Skin self-exam. / Monthly.  Influenza vaccine. / Every year.  Tetanus, diphtheria, and acellular pertussis (Tdap/Td) vaccine.** / Consult your health care provider. Pregnant women should receive 1 dose of Tdap vaccine during each pregnancy. 1 dose of Td every 10 years.  Varicella vaccine.** / Consult your health care provider. Pregnant females who do not have evidence of immunity should receive the first dose after pregnancy.  Zoster vaccine.** / 1 dose for adults aged 60 years or older.  Measles, mumps, rubella (MMR) vaccine.** / You need at least 1 dose of MMR if you were born in 1959 or later. You may also need a 2nd dose. For females of childbearing age, rubella immunity should be determined. If there is no evidence of immunity, females who are not pregnant should be vaccinated. If there is no evidence of immunity, females who are pregnant should delay immunization until after pregnancy.  Pneumococcal 13-valent conjugate (PCV13) vaccine.** / Consult your health care provider.  Pneumococcal polysaccharide (PPSV23) vaccine.** / 1 to 2 doses if you smoke cigarettes or if you have certain conditions.  Meningococcal vaccine.** / Consult your health care provider.  Hepatitis A vaccine.** / Consult your health care provider.  Hepatitis B   vaccine.** / Consult your health care provider.  Haemophilus influenzae type b (Hib) vaccine.** / Consult your health care provider. Ages 55 years and over  Blood pressure check.** / Every 1 to 2 years.  Lipid and cholesterol check.** / Every 5 years beginning at age 9 years.  Lung cancer screening. / Every year if you are aged 69-80 years and have a 30-pack-year history of smoking and currently smoke or have quit within the past 15 years. Yearly screening is stopped  once you have quit smoking for at least 15 years or develop a health problem that would prevent you from having lung cancer treatment.  Clinical breast exam.** / Every year after age 8 years.  BRCA-related cancer risk assessment.** / For women who have family members with a BRCA-related cancer (breast, ovarian, tubal, or peritoneal cancers).  Mammogram.** / Every year beginning at age 72 years and continuing for as long as you are in good health. Consult with your health care provider.  Pap test.** / Every 3 years starting at age 75 years through age 64 or 68 years with 3 consecutive normal Pap tests. Testing can be stopped between 65 and 70 years with 3 consecutive normal Pap tests and no abnormal Pap or HPV tests in the past 10 years.  HPV screening.** / Every 3 years from ages 4 years through ages 36 or 42 years with a history of 3 consecutive normal Pap tests. Testing can be stopped between 65 and 70 years with 3 consecutive normal Pap tests and no abnormal Pap or HPV tests in the past 10 years.  Fecal occult blood test (FOBT) of stool. / Every year beginning at age 51 years and continuing until age 45 years. You may not need to do this test if you get a colonoscopy every 10 years.  Flexible sigmoidoscopy or colonoscopy.** / Every 5 years for a flexible sigmoidoscopy or every 10 years for a colonoscopy beginning at age 38 years and continuing until age 56 years.  Hepatitis C blood test.** / For all people born from [birth year] through 1967 and any individual with known risks for hepatitis C.  Osteoporosis screening.** / A one-time screening for women ages 55 years and over and women at risk for fractures or osteoporosis.  Skin self-exam. / Monthly.  Influenza vaccine. / Every year.  Tetanus, diphtheria, and acellular pertussis (Tdap/Td) vaccine.** / 1 dose of Td every 10 years.  Varicella vaccine.** / Consult your health care provider.  Zoster vaccine.** / 1 dose for adults aged 77 years  or older.  Pneumococcal 13-valent conjugate (PCV13) vaccine.** / Consult your health care provider.  Pneumococcal polysaccharide (PPSV23) vaccine.** / 1 dose for all adults aged 55 years and older.  Meningococcal vaccine.** / Consult your health care provider.  Hepatitis A vaccine.** / Consult your health care provider.  Hepatitis B vaccine.** / Consult your health care provider.  Haemophilus influenzae type b (Hib) vaccine.** / Consult your health care provider. ** Family history and personal history of risk and conditions may change your health care provider's recommendations. Document Released: 08/30/2001 Document Revised: 11/18/2013 Document Reviewed: 11/29/2010 [facility] Patient Information 2015 [place], [place]. This information is not intended to replace advice given to you by your health care provider. Make sure you discuss any questions you have with your health care provider.

## 2015-02-02 NOTE — Progress Notes (Signed)
Pre visit review using our clinic review tool, if applicable. No additional management support is needed unless otherwise documented below in the visit note. 

## 2015-02-02 NOTE — Assessment & Plan Note (Signed)
Well controlled, no changes to meds. Encouraged heart healthy diet such as the DASH diet and exercise as tolerated.  °

## 2015-02-02 NOTE — Assessment & Plan Note (Signed)
On Levothyroxine, continue to monitor 

## 2015-02-13 ENCOUNTER — Ambulatory Visit: Payer: BLUE CROSS/BLUE SHIELD | Admitting: Cardiovascular Disease

## 2015-02-16 NOTE — Assessment & Plan Note (Signed)
No recent episodes, reminded to minimize caffeine

## 2015-03-01 NOTE — Sleep Study (Signed)
NAME: [name] DATE OF BIRTH:  May 06, 1951 MEDICAL RECORD NUMBER [id number]  [facility]  PHYSICIAN: Korie Brabson A  DATE OF STUDY: 02/02/2015  SLEEP STUDY TYPE: Positive Airway Pressure Titration               REFERRING PHYSICIAN: Troy Sine, MD   HEIGHT: 5' 2.5" (158.8 cm)  WEIGHT: 218 lb (98.884 kg)    Body mass index is 39.21 kg/(m^2).  NECK SIZE: 15 in. Patient Name: Monica Wallace, Monica Wallace Date: 02/02/2015 Gender: Female D.O.B: November 01, 1950 Age (years): 43 Referring Provider: Not Available Height (inches): 63 Interpreting Physician: Shelva Majestic MD, ABSM Weight (lbs): 218 RPSGT: Laren Everts BMI: 34 MRN: [id number] Neck Size: 15.00   CLINICAL INFORMATION The patient is referred for a CPAP titration to treat sleep apnea.  Prior PSG: Mild obstructive sleep apnea/hypoxia syndrome.  However, events were moderately severe during REM sleep. Respiratory events with frequent oxygen desaturation to a nadir of 80% during REM sleep.   Date of NPSG, Split Night or HST:  10/01/2014  SLEEP STUDY TECHNIQUE As per the AASM Manual for the Scoring of Sleep and Associated Events v2.3 (April 2016) with a hypopnea requiring 4% desaturations.  The channels recorded and monitored were frontal, central and occipital EEG, electrooculogram (EOG), submentalis EMG (chin), nasal and oral airflow, thoracic and abdominal wall motion, anterior tibialis EMG, snore microphone, electrocardiogram, and pulse oximetry. Continuous positive airway pressure (CPAP) was initiated at the beginning of the study and titrated to treat sleep-disordered breathing.  MEDICATIONS Medications taken by the patient :  zinc gluconate 50 MG tablet 100 mg, Daily triamterene-hydrochlorothiazide (MAXZIDE) 75-50 MG per tablet SYNTHROID 150 MCG tablet pseudoephedrine-acetaminophen (TYLENOL SINUS) 30-500 MG TABS 1 tablet, Every 4 hours PRN potassium chloride (K-DUR) 10 MEQ tablet pantoprazole  (PROTONIX) 40 MG tablet OVER THE COUNTER MEDICATION nystatin-triamcinolone ointment (MYCOLOG) 1 application, 2 times daily loratadine (CLARITIN) 10 MG tablet 10 mg, Daily ibuprofen (ADVIL,MOTRIN) 200 MG tablet 200 mg, Every 6 hours PRN Fluticasone-Salmeterol (ADVAIR DISKUS) 100-50 MCG/DOSE AEPB 1 puff, 2 times daily fluticasone (FLONASE) 50 MCG/ACT nasal spray 2 spray, Daily Cholecalciferol ([employer] VIT D 5000 HIGH-POTENCY PO) 5,000 Units b complex-C-folic acid 1 MG capsule 1 capsule, Daily albuterol (PROAIR HFA) 108 (90 BASE) MCG/ACT inhaler acetaminophen (TYLENOL) 325 MG tablet 650 mg, Every 6 hours PRN  Medications administered by patient during sleep study : No sleep medicine administered.  TECHNICIAN COMMENTS Comments added by technician: None  Comments added by scorer: N/A  RESPIRATORY PARAMETERS Optimal PAP Pressure (cm):  AHI at Optimal Pressure (/hr): N/A Overall Minimal O2 (%): 86.00 Supine % at Optimal Pressure (%): N/A Minimal O2 at Optimal Pressure (%): 86.00    SLEEP ARCHITECTURE The study was initiated at 10:15:33 PM and ended at 4:49:06 AM.  Sleep onset time was 5.2 minutes and the sleep efficiency was 84.6%. The total sleep time was 333.0 minutes.  The patient spent 9.76% of the night in stage N1 sleep, 68.62% in stage N2 sleep, 0.00% in stage N3 and 21.62% in REM.Stage REM latency was 75.0 minutes  Wake after sleep onset was 55.3. Alpha intrusion was absent. Supine sleep was 26.44%.  CARDIAC DATA The 2 lead EKG demonstrated sinus rhythm. The mean heart rate was 72.19 beats per minute. Other EKG findings include: None.  LEG MOVEMENT DATA The total Periodic Limb Movements of Sleep (PLMS) were 0. The PLMS index was 0.00. A PLMS index of <15 is considered normal in adults.  IMPRESSIONS:  The  patient did not tolerate CPAP and was titrated only to a CPAP pressure of 6 cm water. An optimal PAP pressure could not be selected for this patient based on the available study  data. Central sleep apnea was not noted during this titration (CAI = 0.0/h). Moderate oxygen desaturations were observed during this titration (min O2 = 86.00%). No snoring was audible during this study. No cardiac abnormalities were observed during this study. Clinically significant periodic limb movements were not noted during this study. Arousals associated with PLMs were rare. DIAGNOSIS Obstructive Sleep Apnea (327.23 [G47.33 ICD-10])   RECOMMENDATIONS Recommend a trial of Auto-BiPAP 6-14 cm H2O. Patient did not tolerate CPAP. Avoid alcohol, sedatives and other CNS depressants that may worsen sleep apnea and disrupt normal sleep architecture. Sleep hygiene should be reviewed to assess factors that may improve sleep quality. The patient should be counseled both in good sleep hygiene as well as weight loss, particularly with a body mass index of 38. Return to Sleep Center for re-evaluation after 4 weeks of therapy if formal BI-PAP titration study is needed by insurance; otherwise, sleep clinic evaluation.    [place], American Board of Sleep Medicine  ELECTRONICALLY SIGNED ON:  03/01/2015, 11:33 AM [place] PH: [phone]   FX: [phone] [place]

## 2015-03-13 ENCOUNTER — Other Ambulatory Visit (HOSPITAL_BASED_OUTPATIENT_CLINIC_OR_DEPARTMENT_OTHER): Payer: BLUE CROSS/BLUE SHIELD

## 2015-03-17 ENCOUNTER — Encounter: Payer: Self-pay | Admitting: Cardiovascular Disease

## 2015-03-17 ENCOUNTER — Telehealth: Payer: Self-pay | Admitting: *Deleted

## 2015-03-17 ENCOUNTER — Telehealth: Payer: Self-pay | Admitting: Cardiovascular Disease

## 2015-03-17 NOTE — Telephone Encounter (Signed)
Pt called in stating that she was returning Wanda's call in regards to some results to a sleep study she had. Please f/u with her  Thanks

## 2015-03-17 NOTE — Telephone Encounter (Signed)
FORWARD TO [facility]

## 2015-03-17 NOTE — Telephone Encounter (Signed)
-----   Message from Troy Sine, MD sent at 03/01/2015 11:38 AM EDT ----- Arrange for BiPAP Aut trial;

## 2015-03-17 NOTE — Telephone Encounter (Signed)
[facility]erral [facility] for [facility]AP auto titration.

## 2015-03-17 NOTE — Telephone Encounter (Signed)
Patient is returning a call to [place]

## 2015-03-18 NOTE — Progress Notes (Signed)
BIPAP auto titration ordered through [facility]. Patient notified.

## 2015-03-18 NOTE — Telephone Encounter (Signed)
Spoke with patient informing her of sleep study results and recommendations.

## 2015-03-19 ENCOUNTER — Encounter: Payer: Self-pay | Admitting: *Deleted

## 2015-03-19 ENCOUNTER — Telehealth: Payer: Self-pay | Admitting: Cardiovascular Disease

## 2015-03-19 NOTE — Telephone Encounter (Signed)
Returned a call to patient. She gave me a updated number for [facility] to resend her CPAP referral.

## 2015-03-19 NOTE — Telephone Encounter (Signed)
Please call,concerning her order for her C-Pap machine.

## 2015-03-20 ENCOUNTER — Ambulatory Visit (HOSPITAL_BASED_OUTPATIENT_CLINIC_OR_DEPARTMENT_OTHER)
Admission: RE | Admit: 2015-03-20 | Discharge: 2015-03-20 | Disposition: A | Discharge status: Home / Self Care | Payer: BLUE CROSS/BLUE SHIELD | Source: Ambulatory Visit | Attending: Family Medicine | Admitting: Family Medicine

## 2015-03-20 DIAGNOSIS — Z90710 Acquired absence of both cervix and uterus: Secondary | ICD-10-CM | POA: Insufficient documentation

## 2015-03-20 DIAGNOSIS — Z780 Asymptomatic menopausal state: Secondary | ICD-10-CM

## 2015-03-20 NOTE — Telephone Encounter (Signed)
Called Monica Wallace back - he needs to know recommended support pressure in addition to other settings.

## 2015-03-20 NOTE — Telephone Encounter (Signed)
Monica Wallace is calling in stating Monica Wallace sent in some orders for a Bipap machine but the pressures are set for a CPAP . He just needed the correcting pressure settings refaxed to the office at [phone]. Please call  Thanks

## 2015-03-24 ENCOUNTER — Ambulatory Visit: Payer: BLUE CROSS/BLUE SHIELD | Admitting: Cardiovascular Disease

## 2015-03-24 NOTE — Telephone Encounter (Signed)
Returned a call to [place] with [facility] informing him per Dr. Claiborne Billings  The support pressure for the patient's BIPAP machine is for. Wrote this on original order and re faxed to [phone].

## 2015-04-01 ENCOUNTER — Encounter: Payer: Self-pay | Admitting: Cardiovascular Disease

## 2015-04-07 ENCOUNTER — Ambulatory Visit: Payer: BLUE CROSS/BLUE SHIELD | Admitting: Cardiovascular Disease

## 2015-05-07 ENCOUNTER — Other Ambulatory Visit: Payer: Self-pay

## 2015-05-07 DIAGNOSIS — Z1231 Encounter for screening mammogram for malignant neoplasm of breast: Secondary | ICD-10-CM

## 2015-05-12 DIAGNOSIS — Z1211 Encounter for screening for malignant neoplasm of colon: Secondary | ICD-10-CM | POA: Diagnosis not present

## 2015-05-12 DIAGNOSIS — K219 Gastro-esophageal reflux disease without esophagitis: Secondary | ICD-10-CM | POA: Diagnosis not present

## 2015-05-12 DIAGNOSIS — R194 Change in bowel habit: Secondary | ICD-10-CM | POA: Diagnosis not present

## 2015-05-12 DIAGNOSIS — R140 Abdominal distension (gaseous): Secondary | ICD-10-CM | POA: Diagnosis not present

## 2015-05-19 ENCOUNTER — Ambulatory Visit (INDEPENDENT_AMBULATORY_CARE_PROVIDER_SITE_OTHER): Payer: BLUE CROSS/BLUE SHIELD | Admitting: Cardiovascular Disease

## 2015-05-19 ENCOUNTER — Encounter: Payer: Self-pay | Admitting: Cardiovascular Disease

## 2015-05-19 VITALS — BP 140/80 | HR 79 | Ht 62.0 in | Wt 205.0 lb

## 2015-05-19 DIAGNOSIS — G4730 Sleep apnea, unspecified: Secondary | ICD-10-CM | POA: Diagnosis not present

## 2015-05-19 DIAGNOSIS — I10 Essential (primary) hypertension: Secondary | ICD-10-CM

## 2015-05-19 DIAGNOSIS — I341 Nonrheumatic mitral (valve) prolapse: Secondary | ICD-10-CM

## 2015-05-19 DIAGNOSIS — E785 Hyperlipidemia, unspecified: Secondary | ICD-10-CM

## 2015-05-19 DIAGNOSIS — R002 Palpitations: Secondary | ICD-10-CM

## 2015-05-19 NOTE — Progress Notes (Signed)
05/19/2015 Monica Wallace   Dec 12, 1950  [id number]  Primary Physician [name], MD Primary Cardiologist: Lorretta Harp MD Renae Gloss    HPI: Monica Wallace is a 64 year old moderately overweight divorced African-American female mother of 2 children, grandmother and 2 grandchildren referred by Dr. Coralyn Mark for evaluation of palpitations. I last saw her in the office 09/19/14. She has a remote history of mitral valve prolapse documented 35 years ago along with PVCs at that time. She works as a Midwife at [employer]. Her history otherwise is remarkable for treated hypertension. She's had a thyroidectomy back in 2002 for thyroid cancer and has been on thyroid replacement therapy since followed by her endocrinologist and primary care physician. She also has a history of GERD. She was seen in [facility] [place] [facility] room for PVCs and palpitations. She is noted to have potassium of 3.0 was placed on K replacement. She has stopped her caffeine intake and her palpitations significantly improved.. She's also noticed increased fatigue and has symptoms compatible with obstructive sleep apnea. She apparently had a sleep study and C-peptide titration study. She currently is on BiPAP and derives  clinical benefit.   Current Outpatient Prescriptions  Medication Sig Dispense Refill  . acetaminophen (TYLENOL) 325 MG tablet Take 650 mg by mouth every 6 (six) hours as needed.    Marland Kitchen albuterol (PROAIR HFA) 108 (90 BASE) MCG/ACT inhaler Inhale 2 inhaltions TID prn 8.5 g 0  . b complex-C-folic acid 1 MG capsule Take 1 capsule by mouth daily.    . Cholecalciferol ([employer]S VIT D 5000 HIGH-POTENCY PO) Take 5,000 Units by mouth.    . fluticasone (FLONASE) 50 MCG/ACT nasal spray Place 2 sprays into both nostrils daily. 16 g 1  . Fluticasone-Salmeterol (ADVAIR DISKUS) 100-50 MCG/DOSE AEPB Inhale 1 puff into the lungs 2 (two) times daily. One inhalation bid until pollen gone then once  daily 60 each 2  . loratadine (CLARITIN) 10 MG tablet Take 10 mg by mouth daily.    Marland Kitchen nystatin-triamcinolone ointment (MYCOLOG) Apply 1 application topically 2 (two) times daily. 30 g 0  . OVER THE COUNTER MEDICATION Potassium    . pantoprazole (PROTONIX) 40 MG tablet TAKE 1 TABLET BY MOUTH EVERY DAY 90 tablet 1  . pseudoephedrine-acetaminophen (TYLENOL SINUS) 30-500 MG TABS Take 1 tablet by mouth every 4 (four) hours as needed.    Marland Kitchen SYNTHROID 150 MCG tablet TAKE 1 TABLET BY MOUTH EVERY DAY BEFORE BREAKFAST 30 tablet 5  . triamterene-hydrochlorothiazide (MAXZIDE) 75-50 MG per tablet Take 1 tablet by mouth daily. 30 tablet 5  . zinc gluconate 50 MG tablet Take 100 mg by mouth daily.     No current facility-administered medications for this visit.    Allergies  Allergen Reactions  . Codeine   . Levaquin [Levofloxacin]   . Penicillins     Social History   Social History  . Marital Status: Divorced    Spouse Name: N/A  . Number of Children: N/A  . Years of Education: N/A   Occupational History  . Not on file.   Social History Main Topics  . Smoking status: Never Smoker   . Smokeless tobacco: Never Used  . Alcohol Use: No  . Drug Use: No  . Sexual Activity: No     Comment: Hysterectomy, lives with mother   Other Topics Concern  . Not on file   Social History Narrative     Review of Systems: General: negative for chills, fever,  night sweats or weight changes.  Cardiovascular: negative for chest pain, dyspnea on exertion, edema, orthopnea, palpitations, paroxysmal nocturnal dyspnea or shortness of breath Dermatological: negative for rash Respiratory: negative for cough or wheezing Urologic: negative for hematuria Abdominal: negative for nausea, vomiting, diarrhea, bright red blood per rectum, melena, or hematemesis Neurologic: negative for visual changes, syncope, or dizziness All other systems reviewed and are otherwise negative except as noted above.    Blood  pressure 140/80, pulse 79, height 5\' 2"  (1.575 m), weight 205 lb (92.987 kg).  General appearance: alert and no distress Neck: no adenopathy, no carotid bruit, no JVD, supple, symmetrical, trachea midline and thyroid not enlarged, symmetric, no tenderness/mass/nodules Lungs: clear to auscultation bilaterally Heart: regular rate and rhythm, S1, S2 normal, no murmur, click, rub or gallop Extremities: extremities normal, atraumatic, no cyanosis or edema  EKG normal sinus rhythm at 79 but ST or T-wave changes. I personally reviewed his EKG  ASSESSMENT AND PLAN:   Palpitations History of palpitations thought to be related to PVCs in the past. Since reducing her caffeine and take she no longer has palpitations. A 2-D echo revealed normal LV function performed in January of this year.  Hyperlipidemia History of hyperlipidemia not on statin therapy. Most recent lipid profile performed 07/01/14 revealed total cholesterol 193, LDL 123 and HDL 47. We will recheck a lipid and liver profile  HTN (hypertension) History of hypertension with blood pressure measures 140/80. She is on Maxzide. Continue current meds at current dosing      Lorretta Harp MD [facility], [facility] 05/19/2015 9:52 AM

## 2015-05-19 NOTE — Assessment & Plan Note (Signed)
History of mitral valve prolapse by remote 2-D echo performed in the 1980s. She had an echo performed 07/21/14 that did not show mitral valve prolapse.

## 2015-05-19 NOTE — Assessment & Plan Note (Signed)
History of hypertension with blood pressure measures 140/80. She is on Maxzide. Continue current meds at current dosing

## 2015-05-19 NOTE — Patient Instructions (Signed)
Medication Instructions:  Your physician recommends that you continue on your current medications as directed. Please refer to the Current Medication list given to you today.   Labwork: Your physician recommends that you return for lab work in: FASTING (LIPID/LIVER) The lab can be found on the FIRST FLOOR of out building in [address]   Testing/Procedures: none  Follow-Up: Your physician wants you to follow-up in: [place]. You will receive a reminder letter in the mail two months in advance. If you don't receive a letter, please call our office to schedule the follow-up appointment.  Our office will call you to discuss you follow - up with Dr. Claiborne Billings related to your BiPap.  Any Other Special Instructions Will Be Listed Below (If Applicable).     If you need a refill on your cardiac medications before your next appointment, please call your pharmacy.

## 2015-05-19 NOTE — Assessment & Plan Note (Signed)
History of hyperlipidemia not on statin therapy. Most recent lipid profile performed 07/01/14 revealed total cholesterol 193, LDL 123 and HDL 47. We will recheck a lipid and liver profile

## 2015-05-19 NOTE — Assessment & Plan Note (Signed)
She had a sleep study which apparently showed sleep apnea. She currently is on BiPAP which she benefits from. I will arrange for her to see Dr. Claiborne Billings back in follow-up

## 2015-05-19 NOTE — Assessment & Plan Note (Signed)
History of palpitations thought to be related to PVCs in the past. Since reducing her caffeine and take she no longer has palpitations. A 2-D echo revealed normal LV function performed in January of this year.

## 2015-05-20 ENCOUNTER — Telehealth: Payer: Self-pay | Admitting: *Deleted

## 2015-05-20 NOTE — Telephone Encounter (Signed)
Called and left message on Monica Wallace's VM to have BIPAP download sent to me for Dr. Claiborne Billings to review.

## 2015-06-03 ENCOUNTER — Ambulatory Visit (INDEPENDENT_AMBULATORY_CARE_PROVIDER_SITE_OTHER): Payer: BLUE CROSS/BLUE SHIELD | Admitting: Cardiovascular Disease

## 2015-06-03 VITALS — BP 149/79 | HR 82 | Ht 62.5 in | Wt 220.3 lb

## 2015-06-03 DIAGNOSIS — E669 Obesity, unspecified: Secondary | ICD-10-CM

## 2015-06-03 DIAGNOSIS — R002 Palpitations: Secondary | ICD-10-CM | POA: Diagnosis not present

## 2015-06-03 DIAGNOSIS — G4730 Sleep apnea, unspecified: Secondary | ICD-10-CM | POA: Diagnosis not present

## 2015-06-03 NOTE — Patient Instructions (Signed)
Your physician recommends that you schedule a follow-up appointment as  needed with Dr. Kelly for sleep. 

## 2015-06-05 ENCOUNTER — Ambulatory Visit
Admission: RE | Admit: 2015-06-05 | Discharge: 2015-06-05 | Disposition: A | Discharge status: Home / Self Care | Payer: BLUE CROSS/BLUE SHIELD | Source: Ambulatory Visit

## 2015-06-05 ENCOUNTER — Encounter: Payer: Self-pay | Admitting: Cardiovascular Disease

## 2015-06-05 DIAGNOSIS — E669 Obesity, unspecified: Secondary | ICD-10-CM | POA: Insufficient documentation

## 2015-06-05 DIAGNOSIS — E119 Type 2 diabetes mellitus without complications: Secondary | ICD-10-CM | POA: Insufficient documentation

## 2015-06-05 DIAGNOSIS — Z1231 Encounter for screening mammogram for malignant neoplasm of breast: Secondary | ICD-10-CM

## 2015-06-05 NOTE — Progress Notes (Signed)
Patient ID: Monica Wallace, female   DOB: 02-11-1951, 64 y.o.   MRN: [id number]     HPI: Monica Wallace is a 64 y.o. female who presents to sleep clinic following initiation of CPAP therapy.  Monica Wallace  Is a 64 year old Afro-American female who has previously been followed by Dr. Cecilie Kicks and was referred to Dr. Gwenlyn Found for palpitations. She also has a history of thyroidectomy for thyroid cancer.  He has been on thyroid replacement. Has a history of GERD. She was referred for diagnostic polysomnogram in March 2016.  This revealed mild obstructive sleep apnea overall ( AHI 6.7/h); however, events were moderately severe during REM sleep with a REM sleep AHI of 28.4 per hour.  She underwent a CPAP titration trial.  She did not tolerate CPAP and auto BiPAP was recommended.  She has an air curve 10.  The auto ResMed unit with a maximum IPAP pressure set at 14, and  Minimal EPAP pressure at 6. She states that she is feeling significantly better since instituting therapy.  She is not as tired. She typically goes to bed at 9 PM.  Her sleep is restorative.  She is unaware of recurrent residual daytime sleepiness;  however, her Epworth sleepiness scale score was mildly elevated at 11 as noted below.   Epworth Sleepiness Scale: Situation   Chance of Dozing/Sleeping (0 = never , 1 = slight chance , 2 = moderate chance , 3 = high chance )   sitting and reading 1   watching TV 3   sitting inactive in a public place 0   being a passenger in a motor vehicle for an hour or more 3   lying down in the afternoon 3   sitting and talking to someone 0   sitting quietly after lunch (no alcohol) 1   while stopped for a few minutes in traffic as the driver 0   Total Score  11    Past Medical History  Diagnosis Date  . Thyroid disease   . Arthritis   . Cancer ([place])     thyroid (2002)  . Cystic breast   . Hyperlipidemia   . Menopause   . GERD (gastroesophageal reflux disease)   . Migraine   . Hypertension   .  Palpitations   . Sleep apnea 02/02/2015  . Hepatic cyst 02/02/2015    Past Surgical History  Procedure Laterality Date  . Cholecystectomy    . Appendectomy    . Cesarean section    . Knee arthroplasty    . Carpal tunnel release Right   . Abdominal hysterectomy      took right ovary and uterus    Allergies  Allergen Reactions  . Codeine   . Levaquin [Levofloxacin]   . Penicillins     Current Outpatient Prescriptions  Medication Sig Dispense Refill  . acetaminophen (TYLENOL) 325 MG tablet Take 650 mg by mouth every 6 (six) hours as needed.    Marland Kitchen albuterol (PROAIR HFA) 108 (90 BASE) MCG/ACT inhaler Inhale 2 inhaltions TID prn 8.5 g 0  . b complex-C-folic acid 1 MG capsule Take 1 capsule by mouth daily.    . Cholecalciferol ([employer]S VIT D 5000 HIGH-POTENCY PO) Take 5,000 Units by mouth.    . fluticasone (FLONASE) 50 MCG/ACT nasal spray Place 2 sprays into both nostrils daily. 16 g 1  . Fluticasone-Salmeterol (ADVAIR DISKUS) 100-50 MCG/DOSE AEPB Inhale 1 puff into the lungs 2 (two) times daily. One inhalation bid until pollen gone then once daily  60 each 2  . loratadine (CLARITIN) 10 MG tablet Take 10 mg by mouth daily.    Marland Kitchen nystatin-triamcinolone ointment (MYCOLOG) Apply 1 application topically 2 (two) times daily. 30 g 0  . OVER THE COUNTER MEDICATION Potassium    . pantoprazole (PROTONIX) 40 MG tablet TAKE 1 TABLET BY MOUTH EVERY DAY 90 tablet 1  . pseudoephedrine-acetaminophen (TYLENOL SINUS) 30-500 MG TABS Take 1 tablet by mouth every 4 (four) hours as needed.    Marland Kitchen SYNTHROID 150 MCG tablet TAKE 1 TABLET BY MOUTH EVERY DAY BEFORE BREAKFAST 30 tablet 5  . triamterene-hydrochlorothiazide (MAXZIDE) 75-50 MG per tablet Take 1 tablet by mouth daily. 30 tablet 5  . zinc gluconate 50 MG tablet Take 100 mg by mouth daily.     No current facility-administered medications for this visit.    Social History   Social History  . Marital Status: Divorced    Spouse Name: N/A  . Number of  Children: N/A  . Years of Education: N/A   Occupational History  . Not on file.   Social History Main Topics  . Smoking status: Never Smoker   . Smokeless tobacco: Never Used  . Alcohol Use: No  . Drug Use: No  . Sexual Activity: No     Comment: Hysterectomy, lives with mother   Other Topics Concern  . Not on file   Social History Narrative   Additional social history is notable in that she works as a Psychologist, forensic for [employer].  Family History  Problem Relation Age of Onset  . Arthritis Mother   . Transient ischemic attack Mother   . Hypertension Mother   . Hyperlipidemia Mother   . Diabetes Father   . Heart disease Father   . Arthritis Father   . Kidney disease Father   . Hypertension Father   . Hyperlipidemia Father   . Cancer Sister 38    pancreatic  . Fibromyalgia Sister   . Asthma Maternal Grandfather     smoker  . Depression Maternal Grandfather   . Dementia Paternal Grandmother   . Diabetes Paternal Grandfather   . Hypertension Brother   . Scoliosis Daughter     had rods placed and removed  . Arthritis Sister   . Gout Sister   . GER disease Sister      ROS General: Negative; No fevers, chills, or night sweats HEENT: Negative; No changes in vision or hearing, sinus congestion, difficulty swallowing Pulmonary: Negative; No cough, wheezing, shortness of breath, hemoptysis Cardiovascular: Negative; No chest pain, presyncope, syncope, palpatations GI: Negative; No nausea, vomiting, diarrhea, or abdominal pain GU: Negative; No dysuria, hematuria, or difficulty voiding Musculoskeletal: Negative; no myalgias, joint pain, or weakness Hematologic: Negative; no easy bruising, bleeding Endocrine: Negative; no heat/cold intolerance Neuro: Negative; no changes in balance, headaches Skin: Negative; No rashes or skin lesions Psychiatric: Negative; No behavioral problems, depression Sleep:  Positive for obstructive sleep apnea, now on BiPAP  therapy. No  Breakthrough snoring, bruxism, restless legs, hypnogognic hallucinations, no cataplexy   Physical Exam BP 149/79 mmHg  Pulse 82  Ht 5' 2.5" (1.588 m)  Wt 220 lb 4.8 oz (99.927 kg)  BMI 39.63 kg/m2  Wt Readings from Last 3 Encounters:  06/03/15 220 lb 4.8 oz (99.927 kg)  05/19/15 205 lb (92.987 kg)  02/02/15 218 lb (98.884 kg)   General: Alert, oriented, no distress.  Skin: normal turgor, no rashes HEENT: Normocephalic, atraumatic. Pupils round and reactive; sclera anicteric; extraocular muscles  intact; Fundi  Without hemorrhages or exudates Nose without nasal septal hypertrophy Mouth/Parynx benign; Mallinpatti scale 3 Neck: No JVD, no carotid briuts Lungs: clear to ausculatation and percussion; no wheezing or rales  Chest wall: No tenderness to palpation Heart: RRR, s1 s2 normal  Abdomen: soft, nontender; no hepatosplenomehaly, BS+; abdominal aorta nontender and not dilated by palpation. Back: No CVA tenderness Pulses 2+ Extremities: no clubbinbg cyanosis or edema, Homan's sign negative  Neurologic: grossly nonfocal; cranial nerves intact. Psychological: Normal affect and mood.  ECG not done today, but prior ECGs have been reviewed  LABS:  BMP Latest Ref Rng 09/18/2014 09/15/2014 07/01/2014  Glucose 70 - 99 mg/dL 117(H) 99 85  BUN 6 - 23 mg/dL 16 17 12   Creatinine 0.50 - 1.10 mg/dL 0.81 0.93 0.85  Sodium 135 - 145 mEq/L 139 133(L) 138  Potassium 3.5 - 5.3 mEq/L 3.6 3.0(L) 3.6  Chloride 96 - 112 mEq/L 99 98 97  CO2 19 - 32 mEq/L 29 29 27   Calcium 8.4 - 10.5 mg/dL 9.0 9.0 9.0     Hepatic Function Latest Ref Rng 09/18/2014 07/01/2014  Total Protein 6.0 - 8.3 g/dL 6.6 6.6  Albumin 3.5 - 5.2 g/dL 3.9 3.7  AST 0 - 37 U/L 21 21  ALT 0 - 35 U/L 19 14  Alk Phosphatase 39 - 117 U/L 96 95  Total Bilirubin 0.2 - 1.2 mg/dL 0.2 0.3     CBC Latest Ref Rng 09/15/2014 07/01/2014 08/30/2013  WBC 4.0 - 10.5 K/uL 11.9(H) 8.6 10.4  Hemoglobin 12.0 - 15.0 g/dL 13.9 13.2 13.8   Hematocrit 36.0 - 46.0 % 41.4 39.6 40.1  Platelets 150 - 400 K/uL 318 361 337     Lipid Panel     Component Value Date/Time   CHOL 193 07/01/2014 0951   TRIG 115 07/01/2014 0951   HDL 47 07/01/2014 0951   CHOLHDL 4.1 07/01/2014 0951   VLDL 23 07/01/2014 0951   LDLCALC 123* 07/01/2014 0951     RADIOLOGY: Mm Screening Breast Tomo Bilateral  06/05/2015  CLINICAL DATA:  Screening. EXAM: DIGITAL SCREENING BILATERAL MAMMOGRAM WITH 3D TOMO WITH CAD COMPARISON:  Previous exam(s). ACR Breast Density Category b: There are scattered areas of fibroglandular density. FINDINGS: There are no findings suspicious for malignancy. Images were processed with CAD. IMPRESSION: No mammographic evidence of malignancy. A result letter of this screening mammogram will be mailed directly to the patient. RECOMMENDATION: Screening mammogram in one year. (Code:SM-B-01Y) BI-RADS CATEGORY  1: Negative. Electronically Signed   By: Abelardo Diesel M.D.   On: 06/05/2015 13:49      ASSESSMENT AND PLAN:  Monica Wallace is a 64 year old female who has a history of prior thyroid abnormality, requiring thyroidectomy with resultant hypothyroidism. She was found to have mild overall sleep apnea but her sleep apnea was moderately severe during REM sleep.  I reviewed both her diagnostic polysomnogram and CPAP titration trial with her in detail. On her present settings she is averaging a maximum IPAP pressure of 12 and an EPAP pressure of 8.  She does not have significant leak but had only several days of increased leakage early on in therapy, which has improved significantly. Her AHI is excellent at 0.6. She is 100% compliant with reference to today's used greater than 4 hours duration.  She is averaging 5 hours and 30 minutes of CPAP use.  Her blood pressure today was upper normal in current therapy.  She is just shy of being morbidly obese and weight  reduction and exercise was recommended.  She will follow-up with Dr. Gwenlyn Found for  cardiology care.  Per Medicare requirements  I will see her one year for follow-up sleep evaluation.   Time spent: 30 minutes  Troy Sine, MD, [facility]  06/05/2015 10:24 PM

## 2015-06-08 LAB — HM COLONOSCOPY

## 2015-06-29 ENCOUNTER — Telehealth: Payer: Self-pay | Admitting: Cardiovascular Disease

## 2015-06-29 NOTE — Telephone Encounter (Signed)
Please call,regarding denial of her sleep equipment.

## 2015-07-03 NOTE — Telephone Encounter (Signed)
Called patient and left message on personal voicemail that I received her message as well as a notice from [facility]. I will advise Dr Claiborne Billings of the insurances decision and let her know if he calls the insurance company to try to get override or if he plans to change the order to a CPAP machine. Call me back if questions.

## 2015-07-10 ENCOUNTER — Encounter: Payer: BLUE CROSS/BLUE SHIELD | Admitting: Family Medicine

## 2015-07-13 DIAGNOSIS — J45909 Unspecified asthma, uncomplicated: Secondary | ICD-10-CM | POA: Diagnosis not present

## 2015-07-16 ENCOUNTER — Ambulatory Visit (INDEPENDENT_AMBULATORY_CARE_PROVIDER_SITE_OTHER): Payer: BLUE CROSS/BLUE SHIELD | Admitting: Medical

## 2015-07-16 ENCOUNTER — Telehealth: Payer: Self-pay | Admitting: Medical

## 2015-07-16 ENCOUNTER — Encounter: Payer: Self-pay | Admitting: Medical

## 2015-07-16 VITALS — BP 124/80 | HR 81 | Temp 98.0°F | Ht 62.0 in | Wt 221.4 lb

## 2015-07-16 DIAGNOSIS — E039 Hypothyroidism, unspecified: Secondary | ICD-10-CM | POA: Diagnosis not present

## 2015-07-16 DIAGNOSIS — I10 Essential (primary) hypertension: Secondary | ICD-10-CM | POA: Diagnosis not present

## 2015-07-16 DIAGNOSIS — J321 Chronic frontal sinusitis: Secondary | ICD-10-CM | POA: Diagnosis not present

## 2015-07-16 DIAGNOSIS — R062 Wheezing: Secondary | ICD-10-CM

## 2015-07-16 DIAGNOSIS — J209 Acute bronchitis, unspecified: Secondary | ICD-10-CM | POA: Diagnosis not present

## 2015-07-16 LAB — COMPREHENSIVE METABOLIC PANEL
ALT: 29 U/L (ref 0–35)
AST: 30 U/L (ref 0–37)
Albumin: 3.8 g/dL (ref 3.5–5.2)
Alkaline Phosphatase: 96 U/L (ref 39–117)
BUN: 11 mg/dL (ref 6–23)
CO2: 29 mEq/L (ref 19–32)
Calcium: 9.1 mg/dL (ref 8.4–10.5)
Chloride: 101 mEq/L (ref 96–112)
Creatinine, Ser: 0.88 mg/dL (ref 0.40–1.20)
GFR: 82.58 mL/min (ref >60.00)
Glucose, Bld: 101 mg/dL — ABNORMAL HIGH (ref 70–99)
Potassium: 3.6 mEq/L (ref 3.5–5.1)
Sodium: 139 mEq/L (ref 135–145)
Total Bilirubin: 0.3 mg/dL (ref 0.2–1.2)
Total Protein: 7.3 g/dL (ref 6.0–8.3)

## 2015-07-16 LAB — TSH: TSH: 1.62 u[IU]/mL (ref 0.35–4.50)

## 2015-07-16 MED ORDER — FLUTICASONE-SALMETEROL 100-50 MCG/DOSE IN AEPB: 1.0000 | INHALATION_SPRAY | Freq: Two times a day (BID) | RESPIRATORY_TRACT | Status: DC

## 2015-07-16 MED ORDER — TRIAMTERENE-HCTZ 75-50 MG PO TABS: 1.0000 | ORAL_TABLET | Freq: Every day | ORAL | Status: DC

## 2015-07-16 MED ORDER — ALBUTEROL SULFATE HFA 108 (90 BASE) MCG/ACT IN AERS: INHALATION_SPRAY | RESPIRATORY_TRACT | Status: DC

## 2015-07-16 MED ORDER — AZITHROMYCIN 250 MG PO TABS: ORAL_TABLET | ORAL | Status: DC

## 2015-07-16 MED ORDER — FLUTICASONE PROPIONATE 50 MCG/ACT NA SUSP: 2.0000 | Freq: Every day | NASAL | Status: DC

## 2015-07-16 MED ORDER — SYNTHROID 150 MCG PO TABS: ORAL_TABLET | ORAL | Status: DC

## 2015-07-16 MED ORDER — PREDNISONE 20 MG PO TABS: ORAL_TABLET | ORAL | Status: DC

## 2015-07-16 NOTE — Progress Notes (Signed)
Pre visit review using our clinic review tool, if applicable. No additional management support is needed unless otherwise documented below in the visit note. 

## 2015-07-16 NOTE — Telephone Encounter (Signed)
rx synthroid sent in.

## 2015-07-16 NOTE — Progress Notes (Signed)
Subjective:    Patient ID: Monica Wallace, female    DOB: 06-25-1951, 64 y.o.   MRN: [id number]  HPI Sick since [place]. Nasal congestion and sinus pressure. Some ear pressure and blowing up some mucous from nose. Some productive cough and nasal congestion. Pt went to urgent care on Monday. Pt was given benzonatate and albuterol solution for neb treatment.  She states very rare use of nebulizer in the past.  Pt states uses her advair and flonase usually just in the spring.  Htn- good level today. Pt on maxide. No neuro or cardiac symptom reported.  Hypothyroid- no level checked recently. Pt needs refill. About one month left on med.  Review of Systems  Constitutional: Negative for fever, chills and fatigue.  HENT: Positive for congestion and sinus pressure.   Respiratory: Positive for cough and wheezing. Negative for shortness of breath.        Pt states using neb twice a day.  Cardiovascular: Negative for chest pain and palpitations.  Gastrointestinal: Negative for abdominal pain.  Neurological: Negative for dizziness and headaches.  Hematological: Negative for adenopathy. Does not bruise/bleed easily.  Psychiatric/Behavioral: Negative for behavioral problems and confusion.    Past Medical History  Diagnosis Date  . Thyroid disease   . Arthritis   . Cancer ([place])     thyroid (2002)  . Cystic breast   . Hyperlipidemia   . Menopause   . GERD (gastroesophageal reflux disease)   . Migraine   . Hypertension   . Palpitations   . Sleep apnea 02/02/2015  . Hepatic cyst 02/02/2015    Social History   Social History  . Marital Status: Divorced    Spouse Name: N/A  . Number of Children: N/A  . Years of Education: N/A   Occupational History  . Not on file.   Social History Main Topics  . Smoking status: Never Smoker   . Smokeless tobacco: Never Used  . Alcohol Use: No  . Drug Use: No  . Sexual Activity: No     Comment: Hysterectomy, lives with mother   Other Topics  Concern  . Not on file   Social History Narrative    Past Surgical History  Procedure Laterality Date  . Cholecystectomy    . Appendectomy    . Cesarean section    . Knee arthroplasty    . Carpal tunnel release Right   . Abdominal hysterectomy      took right ovary and uterus    Family History  Problem Relation Age of Onset  . Arthritis Mother   . Transient ischemic attack Mother   . Hypertension Mother   . Hyperlipidemia Mother   . Diabetes Father   . Heart disease Father   . Arthritis Father   . Kidney disease Father   . Hypertension Father   . Hyperlipidemia Father   . Cancer Sister 38    pancreatic  . Fibromyalgia Sister   . Asthma Maternal Grandfather     smoker  . Depression Maternal Grandfather   . Dementia Paternal Grandmother   . Diabetes Paternal Grandfather   . Hypertension Brother   . Scoliosis Daughter     had rods placed and removed  . Arthritis Sister   . Gout Sister   . GER disease Sister     Allergies  Allergen Reactions  . Codeine   . Levaquin [Levofloxacin]   . Penicillins     Current Outpatient Prescriptions on File Prior to Visit  Medication Sig  Dispense Refill  . acetaminophen (TYLENOL) 325 MG tablet Take 650 mg by mouth every 6 (six) hours as needed.    Marland Kitchen albuterol (PROAIR HFA) 108 (90 BASE) MCG/ACT inhaler Inhale 2 inhaltions TID prn 8.5 g 0  . b complex-C-folic acid 1 MG capsule Take 1 capsule by mouth daily.    . Cholecalciferol ([employer]S VIT D 5000 HIGH-POTENCY PO) Take 5,000 Units by mouth.    . fluticasone (FLONASE) 50 MCG/ACT nasal spray Place 2 sprays into both nostrils daily. 16 g 1  . Fluticasone-Salmeterol (ADVAIR DISKUS) 100-50 MCG/DOSE AEPB Inhale 1 puff into the lungs 2 (two) times daily. One inhalation bid until pollen gone then once daily 60 each 2  . loratadine (CLARITIN) 10 MG tablet Take 10 mg by mouth daily.    Marland Kitchen nystatin-triamcinolone ointment (MYCOLOG) Apply 1 application topically 2 (two) times daily. 30 g 0  .  OVER THE COUNTER MEDICATION Potassium    . pantoprazole (PROTONIX) 40 MG tablet TAKE 1 TABLET BY MOUTH EVERY DAY 90 tablet 1  . pseudoephedrine-acetaminophen (TYLENOL SINUS) 30-500 MG TABS Take 1 tablet by mouth every 4 (four) hours as needed.    Marland Kitchen SYNTHROID 150 MCG tablet TAKE 1 TABLET BY MOUTH EVERY DAY BEFORE BREAKFAST 30 tablet 5  . triamterene-hydrochlorothiazide (MAXZIDE) 75-50 MG per tablet Take 1 tablet by mouth daily. 30 tablet 5  . zinc gluconate 50 MG tablet Take 100 mg by mouth daily.     No current facility-administered medications on file prior to visit.    BP 124/80 mmHg  Pulse 81  Temp(Src) 98 F (36.7 C) (Oral)  Ht 5\' 2"  (1.575 m)  Wt 221 lb 6.4 oz (100.426 kg)  BMI 40.48 kg/m2  SpO2 97%       Objective:   Physical Exam  General  Mental Status - Alert. General Appearance - Well groomed. Not in acute distress.  Skin Rashes- No Rashes.  HEENT Head- Normal. Ear Auditory Canal - Left- Normal. Right - Normal.Tympanic Membrane- Left- Normal. Right- Normal. Eye Sclera/Conjunctiva- Left- Normal. Right- Normal. Nose & Sinuses Nasal Mucosa- Left-  Boggy and Congested. Right-  Boggy and  Congested.Bilateral maxillary  Pressure but no  frontal sinus pressure. Mouth & Throat Lips: Upper Lip- Normal: no dryness, cracking, pallor, cyanosis, or vesicular eruption. Lower Lip-Normal: no dryness, cracking, pallor, cyanosis or vesicular eruption. Buccal Mucosa- Bilateral- No Aphthous ulcers. Oropharynx- No Discharge or Erythema. Tonsils: Characteristics- Bilateral- No Erythema or Congestion. Size/Enlargement- Bilateral- No enlargement. Discharge- bilateral-None.  Neck Neck- Supple. No Masses.   Chest and Lung Exam Auscultation: Breath Sounds:-Clear even and unlabored. Faint upper lobe rhonchi and faint expiratory wheeze.  Cardiovascular Auscultation:Rythm- Regular, rate and rhythm. Murmurs & Other Heart Sounds:Ausculatation of the heart reveal- No  Murmurs.  Lymphatic Head & Neck General Head & Neck Lymphatics: Bilateral: Description- No Localized lymphadenopathy.    Neurologic Cranial Nerve exam:- CN III-XII intact(No nystagmus), symmetric smile.         Assessment & Plan:  For sinus infection and bronchitis will rx azithromycin antibiotic.  For cough continue benzonatate.  For nasal congestion use flonase.  For wheezing continue you inhaler and would recommend using advair presently.  Use your neb/albuterol if needed.  If over long holiday weekend you worse then 3 day taper dose of prednisone.  Will refil maxide and advair today.   Get tsh today. Then refill thryoid med appropiate.  Follow up in 7 days or as needed.

## 2015-07-16 NOTE — Patient Instructions (Addendum)
For sinus infection and bronchitis will rx azithromycin antibiotic.  For cough continue benzonatate.  For nasal congestion use flonase.  For wheezing continue you inhaler and would recommend using advair presently.  Use your neb/albuterol if needed.  If over long holiday weekend you worse then 3 day taper dose of prednisone.  Will refil maxide and advair today.  Htn controlled. Refill maxide today and get cmp.   Get tsh today. Then refill thryoid med appropiate.  Follow up in 7 days or as needed.

## 2015-07-19 HISTORY — PX: EYE SURGERY: SHX253

## 2015-07-22 ENCOUNTER — Ambulatory Visit (INDEPENDENT_AMBULATORY_CARE_PROVIDER_SITE_OTHER): Payer: BLUE CROSS/BLUE SHIELD | Admitting: Medical

## 2015-07-22 ENCOUNTER — Encounter: Payer: Self-pay | Admitting: Medical

## 2015-07-22 VITALS — BP 114/72 | HR 103 | Temp 97.9°F | Ht 62.0 in | Wt 221.2 lb

## 2015-07-22 DIAGNOSIS — H6983 Other specified disorders of Eustachian tube, bilateral: Secondary | ICD-10-CM

## 2015-07-22 DIAGNOSIS — J321 Chronic frontal sinusitis: Secondary | ICD-10-CM

## 2015-07-22 DIAGNOSIS — R062 Wheezing: Secondary | ICD-10-CM

## 2015-07-22 MED ORDER — PREDNISONE 10 MG PO TABS: ORAL_TABLET | ORAL | Status: DC

## 2015-07-22 MED ORDER — DOXYCYCLINE HYCLATE 100 MG PO TABS: 100.0000 mg | ORAL_TABLET | Freq: Two times a day (BID) | ORAL | Status: DC

## 2015-07-22 NOTE — Progress Notes (Signed)
Pre visit review using our clinic review tool, if applicable. No additional management support is needed unless otherwise documented below in the visit note. 

## 2015-07-22 NOTE — Progress Notes (Signed)
Subjective:    Patient ID: Monica Wallace, female    DOB: 05-11-1951, 65 y.o.   MRN: [id number]  HPI   Pt in for follow up. She still have some sinus pressure despite treatment with azithromycin. Also she has  some persisting ear pressure. The ear pressure has improved a lot but still some residual pressure.  Pt still has some wheezing. She feels some sob and some faint constricted breathing. This is despite 3 taper prednisone. Pt is on albuterol neb tx and she is taking advair. Notes her breathing feel better but not completely back to baseline.  Overall feels better but feels like this is lingering symptoms as above.    Review of Systems  Constitutional: Negative for fever, chills and fatigue.  HENT: Positive for ear pain and sinus pressure. Negative for congestion, nosebleeds, postnasal drip and rhinorrhea.        Ear pressure.  Respiratory: Positive for wheezing. Negative for cough and chest tightness.        See hpi.  Cardiovascular: Negative for chest pain and palpitations.  Musculoskeletal: Negative for back pain.  Skin: Negative for rash.  Neurological: Negative for dizziness and headaches.  Hematological: Negative for adenopathy. Does not bruise/bleed easily.   Past Medical History  Diagnosis Date  . Thyroid disease   . Arthritis   . Cancer ([place])     thyroid (2002)  . Cystic breast   . Hyperlipidemia   . Menopause   . GERD (gastroesophageal reflux disease)   . Migraine   . Hypertension   . Palpitations   . Sleep apnea 02/02/2015  . Hepatic cyst 02/02/2015    Social History   Social History  . Marital Status: Divorced    Spouse Name: N/A  . Number of Children: N/A  . Years of Education: N/A   Occupational History  . Not on file.   Social History Main Topics  . Smoking status: Never Smoker   . Smokeless tobacco: Never Used  . Alcohol Use: No  . Drug Use: No  . Sexual Activity: No     Comment: Hysterectomy, lives with mother   Other Topics Concern  .  Not on file   Social History Narrative    Past Surgical History  Procedure Laterality Date  . Cholecystectomy    . Appendectomy    . Cesarean section    . Knee arthroplasty    . Carpal tunnel release Right   . Abdominal hysterectomy      took right ovary and uterus    Family History  Problem Relation Age of Onset  . Arthritis Mother   . Transient ischemic attack Mother   . Hypertension Mother   . Hyperlipidemia Mother   . Diabetes Father   . Heart disease Father   . Arthritis Father   . Kidney disease Father   . Hypertension Father   . Hyperlipidemia Father   . Cancer Sister 9    pancreatic  . Fibromyalgia Sister   . Asthma Maternal Grandfather     smoker  . Depression Maternal Grandfather   . Dementia Paternal Grandmother   . Diabetes Paternal Grandfather   . Hypertension Brother   . Scoliosis Daughter     had rods placed and removed  . Arthritis Sister   . Gout Sister   . GER disease Sister     Allergies  Allergen Reactions  . Codeine   . Levaquin [Levofloxacin]   . Penicillins     Current Outpatient Prescriptions  on File Prior to Visit  Medication Sig Dispense Refill  . acetaminophen (TYLENOL) 325 MG tablet Take 650 mg by mouth every 6 (six) hours as needed.    Marland Kitchen albuterol (PROAIR HFA) 108 (90 Base) MCG/ACT inhaler Inhale 2 inhaltions TID prn 8.5 g 0  . b complex-C-folic acid 1 MG capsule Take 1 capsule by mouth daily.    . Cholecalciferol (CVS VIT D 5000 HIGH-POTENCY PO) Take 5,000 Units by mouth.    . fluticasone (FLONASE) 50 MCG/ACT nasal spray Place 2 sprays into both nostrils daily. 16 g 3  . Fluticasone-Salmeterol (ADVAIR DISKUS) 100-50 MCG/DOSE AEPB Inhale 1 puff into the lungs 2 (two) times daily. One inhalation bid until pollen gone then once daily 60 each 2  . loratadine (CLARITIN) 10 MG tablet Take 10 mg by mouth daily.    Marland Kitchen nystatin-triamcinolone ointment (MYCOLOG) Apply 1 application topically 2 (two) times daily. 30 g 0  . OVER THE  COUNTER MEDICATION Potassium    . pantoprazole (PROTONIX) 40 MG tablet TAKE 1 TABLET BY MOUTH EVERY DAY 90 tablet 1  . pseudoephedrine-acetaminophen (TYLENOL SINUS) 30-500 MG TABS Take 1 tablet by mouth every 4 (four) hours as needed.    Marland Kitchen SYNTHROID 150 MCG tablet TAKE 1 TABLET BY MOUTH EVERY DAY BEFORE BREAKFAST 30 tablet 5  . triamterene-hydrochlorothiazide (MAXZIDE) 75-50 MG tablet Take 1 tablet by mouth daily. 30 tablet 5  . zinc gluconate 50 MG tablet Take 100 mg by mouth daily.     No current facility-administered medications on file prior to visit.    BP 114/72 mmHg  Pulse 103  Temp(Src) 97.9 F (36.6 C) (Oral)  Ht 5\' 2"  (1.575 m)  Wt 221 lb 4 oz (100.358 kg)  BMI 40.46 kg/m2  SpO2 95%       Objective:   Physical Exam  General  Mental Status - Alert. General Appearance - Well groomed. Not in acute distress.  Skin Rashes- No Rashes.  HEENT Head- Normal. Ear Auditory Canal - Left- Normal. Right - Normal.Tympanic Membrane- Left- Normal. Right- Normal. Eye Sclera/Conjunctiva- Left- Normal. Right- Normal. Nose & Sinuses Nasal Mucosa- Left-  Boggy and Congested. Right-  Boggy and  Congested.Bilateral mild  Maxillary pressure but no  frontal sinus pressure. Mouth & Throat Lips: Upper Lip- Normal: no dryness, cracking, pallor, cyanosis, or vesicular eruption. Lower Lip-Normal: no dryness, cracking, pallor, cyanosis or vesicular eruption. Buccal Mucosa- Bilateral- No Aphthous ulcers. Oropharynx- No Discharge or Erythema. Tonsils: Characteristics- Bilateral- No Erythema or Congestion. Size/Enlargement- Bilateral- No enlargement. Discharge- bilateral-None.  Neck Neck- Supple. No Masses.   Chest and Lung Exam Auscultation: Breath Sounds:-Clear even and unlabored.  Cardiovascular Auscultation:Rythm- Regular, rate and rhythm. Murmurs & Other Heart Sounds:Ausculatation of the heart reveal- No Murmurs.  Lymphatic Head & Neck General Head & Neck Lymphatics: Bilateral:  Description- No Localized lymphadenopathy.       Assessment & Plan:  You are improved moderately but not completley.  Will rx doxycycline for sinus infection.  For your wheezing will rx another round of taper prednsione but extend number of days this time.  Continue you inhalers for asthma  and nasal steroid spray for nasal congestion.  Follow up in 7-10 days(persisting symptoms) or as needed

## 2015-07-22 NOTE — Patient Instructions (Addendum)
You are improved moderately but not completley.  Will rx doxycycline for sinus infection.  For your wheezing will rx another round of taper prednsione but extend number of days this time.  Continue you inhalers for asthma  and nasal steroid spray for nasal congestion.  Follow up in 7-10 days(persisting symptoms) or as needed

## 2015-08-18 ENCOUNTER — Telehealth: Payer: Self-pay | Admitting: Cardiovascular Disease

## 2015-08-18 NOTE — Telephone Encounter (Signed)
New message      Talk to the nurse about her bipap machine

## 2015-08-18 NOTE — Telephone Encounter (Signed)
Returned call to patient no answer.Left message on personal voice mail Monica Wallace out of office today.I will send message to her to discuss bipap machine.

## 2015-08-21 ENCOUNTER — Telehealth: Payer: Self-pay | Admitting: Family Medicine

## 2015-08-21 NOTE — Telephone Encounter (Signed)
Pt had flu shot in Sept or Oct 2016 at [facility] (employer)

## 2015-08-21 NOTE — Telephone Encounter (Signed)
Documented in Health Maintenance and immunizations.

## 2015-08-27 DIAGNOSIS — M542 Cervicalgia: Secondary | ICD-10-CM | POA: Diagnosis not present

## 2015-08-27 DIAGNOSIS — M7581 Other shoulder lesions, right shoulder: Secondary | ICD-10-CM | POA: Diagnosis not present

## 2015-08-27 DIAGNOSIS — M7582 Other shoulder lesions, left shoulder: Secondary | ICD-10-CM | POA: Diagnosis not present

## 2015-08-28 NOTE — Telephone Encounter (Signed)
Received message that patient is upset about her bipap/cpap issue [name] is off today Looked into record and see that on 12/16 there was a problem with her insurance and coverage concerning her bipap/cpap Will forward to [place]: Called patient and left message on personal voicemail that I received her message as well as a notice from [facility]. I will advise Dr Claiborne Billings of the insurances decision and let her know if he calls the insurance company to try to get override or if he plans to change the order to a CPAP machine. Call me back if questions.

## 2015-08-28 NOTE — Telephone Encounter (Signed)
Follow up      Pt is very frustrated that no one has called regarding her bipap issue.  She want to talk to someone today.  Please call her at home at [phone] or on her cell at [phone].

## 2015-08-28 NOTE — Telephone Encounter (Signed)
If Dr. Claiborne Billings orders the auto CPAP first then fails she can get the [place] [facility] [name], Call her please Monday to discuss

## 2015-09-17 DIAGNOSIS — M7582 Other shoulder lesions, left shoulder: Secondary | ICD-10-CM | POA: Diagnosis not present

## 2015-09-25 ENCOUNTER — Other Ambulatory Visit (HOSPITAL_BASED_OUTPATIENT_CLINIC_OR_DEPARTMENT_OTHER): Payer: Self-pay | Admitting: Orthopedic Surgery

## 2015-09-25 DIAGNOSIS — M25512 Pain in left shoulder: Secondary | ICD-10-CM

## 2015-10-03 ENCOUNTER — Ambulatory Visit (HOSPITAL_BASED_OUTPATIENT_CLINIC_OR_DEPARTMENT_OTHER)
Admission: RE | Admit: 2015-10-03 | Discharge: 2015-10-03 | Disposition: A | Discharge status: Home / Self Care | Payer: BLUE CROSS/BLUE SHIELD | Source: Ambulatory Visit | Attending: Orthopedic Surgery | Admitting: Orthopedic Surgery

## 2015-10-03 DIAGNOSIS — M7552 Bursitis of left shoulder: Secondary | ICD-10-CM | POA: Diagnosis not present

## 2015-10-03 DIAGNOSIS — M75112 Incomplete rotator cuff tear or rupture of left shoulder, not specified as traumatic: Secondary | ICD-10-CM | POA: Diagnosis not present

## 2015-10-03 DIAGNOSIS — M19012 Primary osteoarthritis, left shoulder: Secondary | ICD-10-CM | POA: Insufficient documentation

## 2015-10-03 DIAGNOSIS — M25512 Pain in left shoulder: Secondary | ICD-10-CM | POA: Diagnosis not present

## 2015-10-06 ENCOUNTER — Other Ambulatory Visit: Payer: Self-pay | Admitting: Family Medicine

## 2015-10-06 ENCOUNTER — Encounter: Payer: Self-pay | Admitting: Family Medicine

## 2015-10-06 ENCOUNTER — Ambulatory Visit (INDEPENDENT_AMBULATORY_CARE_PROVIDER_SITE_OTHER): Payer: BLUE CROSS/BLUE SHIELD | Admitting: Family Medicine

## 2015-10-06 ENCOUNTER — Ambulatory Visit (HOSPITAL_BASED_OUTPATIENT_CLINIC_OR_DEPARTMENT_OTHER)
Admission: RE | Admit: 2015-10-06 | Discharge: 2015-10-06 | Disposition: A | Discharge status: Home / Self Care | Payer: BLUE CROSS/BLUE SHIELD | Source: Ambulatory Visit | Attending: Family Medicine | Admitting: Family Medicine

## 2015-10-06 VITALS — BP 120/82 | HR 76 | Temp 98.2°F | Ht 62.5 in | Wt 222.2 lb

## 2015-10-06 DIAGNOSIS — M545 Low back pain: Secondary | ICD-10-CM | POA: Diagnosis not present

## 2015-10-06 DIAGNOSIS — I10 Essential (primary) hypertension: Secondary | ICD-10-CM

## 2015-10-06 DIAGNOSIS — M5442 Lumbago with sciatica, left side: Secondary | ICD-10-CM

## 2015-10-06 DIAGNOSIS — E669 Obesity, unspecified: Secondary | ICD-10-CM

## 2015-10-06 MED ORDER — CYCLOBENZAPRINE HCL 10 MG PO TABS: 10.0000 mg | ORAL_TABLET | Freq: Every evening | ORAL | Status: DC | PRN

## 2015-10-06 NOTE — Patient Instructions (Addendum)
Lidocaine patches Curcumen/Turmeric Probiotic daily such as the [place] 10 strain [place] coild [email or web address] Darcy Ward chiropractor   Back Pain, Adult Back pain is very common in adults.The cause of back pain is rarely dangerous and the pain often gets better over time.The cause of your back pain may not be known. Some common causes of back pain include:  Strain of the muscles or ligaments supporting the spine.  Wear and tear (degeneration) of the spinal disks.  Arthritis.  Direct injury to the back. For many people, back pain may return. Since back pain is rarely dangerous, most people can learn to manage this condition on their own. HOME CARE INSTRUCTIONS Watch your back pain for any changes. The following actions may help to lessen any discomfort you are feeling:  Remain active. It is stressful on your back to sit or stand in one place for long periods of time. Do not sit, drive, or stand in one place for more than 30 minutes at a time. Take short walks on even surfaces as soon as you are able.Try to increase the length of time you walk each day.  Exercise regularly as directed by your health care provider. Exercise helps your back heal faster. It also helps avoid future injury by keeping your muscles strong and flexible.  Do not stay in bed.Resting more than 1-2 days can delay your recovery.  Pay attention to your body when you bend and lift. The most comfortable positions are those that put less stress on your recovering back. Always use proper lifting techniques, including:  Bending your knees.  Keeping the load close to your body.  Avoiding twisting.  Find a comfortable position to sleep. Use a firm mattress and lie on your side with your knees slightly bent. If you lie on your back, put a pillow under your knees.  Avoid feeling anxious or stressed.Stress increases muscle tension and can worsen back pain.It is important to recognize when you are anxious or  stressed and learn ways to manage it, such as with exercise.  Take medicines only as directed by your health care provider. Over-the-counter medicines to reduce pain and inflammation are often the most helpful.Your health care provider may prescribe muscle relaxant drugs.These medicines help dull your pain so you can more quickly return to your normal activities and healthy exercise.  Apply ice to the injured area:  Put ice in a plastic bag.  Place a towel between your skin and the bag.  Leave the ice on for 20 minutes, 2-3 times a day for the first 2-3 days. After that, ice and heat may be alternated to reduce pain and spasms.  Maintain a healthy weight. Excess weight puts extra stress on your back and makes it difficult to maintain good posture. SEEK MEDICAL CARE IF:  You have pain that is not relieved with rest or medicine.  You have increasing pain going down into the legs or buttocks.  You have pain that does not improve in one week.  You have night pain.  You lose weight.  You have a fever or chills. SEEK IMMEDIATE MEDICAL CARE IF:   You develop new bowel or bladder control problems.  You have unusual weakness or numbness in your arms or legs.  You develop nausea or vomiting.  You develop abdominal pain.  You feel faint.   This information is not intended to replace advice given to you by your health care provider. Make sure you discuss any questions you have with your  health care provider.   Document Released: 07/04/2005 Document Revised: 07/25/2014 Document Reviewed: 11/05/2013 Elsevier Interactive Patient Education [employer].

## 2015-10-06 NOTE — Progress Notes (Addendum)
Subjective:    Patient ID: Monica Wallace, female    DOB: Jan 19, 1951, 65 y.o.   MRN: [id number]  Chief Complaint  Patient presents with  . Sciatica    HPI Patient is in today for evaluation of low back pain, left posterior hip pain with pain intermittently radiating to left knee. Denies any trauma or fall. No incontinence. No GI or GU complaints.was treated with steroids without relief. It iss somewhat improved today. Denies CP/palp/SOB/HA/congestion/fevers/GI or GU c/o. Taking meds as prescribed  Past Medical History  Diagnosis Date  . Thyroid disease   . Arthritis   . Cancer ([place])     thyroid (2002)  . Cystic breast   . Hyperlipidemia   . Menopause   . GERD (gastroesophageal reflux disease)   . Migraine   . Hypertension   . Palpitations   . Sleep apnea 02/02/2015  . Hepatic cyst 02/02/2015    Past Surgical History  Procedure Laterality Date  . Cholecystectomy    . Appendectomy    . Cesarean section    . Knee arthroplasty    . Carpal tunnel release Right   . Abdominal hysterectomy      took right ovary and uterus    Family History  Problem Relation Age of Onset  . Arthritis Mother   . Transient ischemic attack Mother   . Hypertension Mother   . Hyperlipidemia Mother   . Diabetes Father   . Heart disease Father   . Arthritis Father   . Kidney disease Father   . Hypertension Father   . Hyperlipidemia Father   . Cancer Sister 38    pancreatic  . Fibromyalgia Sister   . Asthma Maternal Grandfather     smoker  . Depression Maternal Grandfather   . Dementia Paternal Grandmother   . Diabetes Paternal Grandfather   . Hypertension Brother   . Scoliosis Daughter     had rods placed and removed  . Arthritis Sister   . Gout Sister   . GER disease Sister     Social History   Social History  . Marital Status: Divorced    Spouse Name: N/A  . Number of Children: N/A  . Years of Education: N/A   Occupational History  . Not on file.   Social History Main  Topics  . Smoking status: Never Smoker   . Smokeless tobacco: Never Used  . Alcohol Use: No  . Drug Use: No  . Sexual Activity: No     Comment: Hysterectomy, lives with mother   Other Topics Concern  . Not on file   Social History Narrative    Outpatient Prescriptions Prior to Visit  Medication Sig Dispense Refill  . acetaminophen (TYLENOL) 325 MG tablet Take 650 mg by mouth every 6 (six) hours as needed.    Marland Kitchen albuterol (PROAIR HFA) 108 (90 Base) MCG/ACT inhaler Inhale 2 inhaltions TID prn 8.5 g 0  . b complex-C-folic acid 1 MG capsule Take 1 capsule by mouth daily.    . Cholecalciferol ([employer]S VIT D 5000 HIGH-POTENCY PO) Take 5,000 Units by mouth.    . doxycycline (VIBRA-TABS) 100 MG tablet Take 1 tablet (100 mg total) by mouth 2 (two) times daily. 20 tablet 0  . fluticasone (FLONASE) 50 MCG/ACT nasal spray Place 2 sprays into both nostrils daily. 16 g 3  . Fluticasone-Salmeterol (ADVAIR DISKUS) 100-50 MCG/DOSE AEPB Inhale 1 puff into the lungs 2 (two) times daily. One inhalation bid until pollen gone then once daily 60  each 2  . loratadine (CLARITIN) 10 MG tablet Take 10 mg by mouth daily.    Marland Kitchen nystatin-triamcinolone ointment (MYCOLOG) Apply 1 application topically 2 (two) times daily. 30 g 0  . OVER THE COUNTER MEDICATION Potassium    . pantoprazole (PROTONIX) 40 MG tablet TAKE 1 TABLET BY MOUTH EVERY DAY 90 tablet 1  . predniSONE (DELTASONE) 10 MG tablet 6 tabs po day 1, 5 tabs po day 2, 4 tabs po day 3, 3 tabs po day 4, 2 tabs po day 5, 1 tab po day 6. 21 tablet 0  . pseudoephedrine-acetaminophen (TYLENOL SINUS) 30-500 MG TABS Take 1 tablet by mouth every 4 (four) hours as needed.    Marland Kitchen SYNTHROID 150 MCG tablet TAKE 1 TABLET BY MOUTH EVERY DAY BEFORE BREAKFAST 30 tablet 5  . triamterene-hydrochlorothiazide (MAXZIDE) 75-50 MG tablet Take 1 tablet by mouth daily. 30 tablet 5  . zinc gluconate 50 MG tablet Take 100 mg by mouth daily.     No facility-administered medications prior to  visit.    Allergies  Allergen Reactions  . Codeine   . Levaquin [Levofloxacin]   . Penicillins     Review of Systems  Constitutional: Negative for fever and malaise/fatigue.  HENT: Negative for congestion.   Eyes: Negative for blurred vision.  Respiratory: Negative for shortness of breath.   Cardiovascular: Negative for chest pain, palpitations and leg swelling.  Gastrointestinal: Negative for nausea, abdominal pain and blood in stool.  Genitourinary: Negative for dysuria and frequency.  Musculoskeletal: Positive for back pain. Negative for falls.  Skin: Negative for rash.  Neurological: Negative for dizziness, loss of consciousness and headaches.  Endo/Heme/Allergies: Negative for environmental allergies.  Psychiatric/Behavioral: Negative for depression. The patient is not nervous/anxious.        Objective:    Physical Exam  Constitutional: She is oriented to person, place, and time. She appears well-developed and well-nourished. No distress.  HENT:  Head: Normocephalic and atraumatic.  Nose: Nose normal.  Eyes: Right eye exhibits no discharge. Left eye exhibits no discharge.  Neck: Normal range of motion. Neck supple.  Cardiovascular: Normal rate and regular rhythm.   No murmur heard. Pulmonary/Chest: Effort normal and breath sounds normal.  Abdominal: Soft. Bowel sounds are normal. There is no tenderness.  Musculoskeletal: She exhibits no edema.  Pain with palpation over left posterior hip.   Neurological: She is alert and oriented to person, place, and time.  Skin: Skin is warm and dry.  Psychiatric: She has a normal mood and affect.  Nursing note and vitals reviewed.   BP 120/82 mmHg  Pulse 76  Temp(Src) 98.2 F (36.8 C) (Oral)  Ht 5' 2.5" (1.588 m)  Wt 222 lb 4 oz (100.812 kg)  BMI 39.98 kg/m2  SpO2 95% Wt Readings from Last 3 Encounters:  10/06/15 222 lb 4 oz (100.812 kg)  07/22/15 221 lb 4 oz (100.358 kg)  07/16/15 221 lb 6.4 oz (100.426 kg)      Lab Results  Component Value Date   WBC 11.9* 09/15/2014   HGB 13.9 09/15/2014   HCT 41.4 09/15/2014   PLT 318 09/15/2014   GLUCOSE 101* 07/16/2015   CHOL 193 07/01/2014   TRIG 115 07/01/2014   HDL 47 07/01/2014   LDLCALC 123* 07/01/2014   ALT 29 07/16/2015   AST 30 07/16/2015   NA 139 07/16/2015   K 3.6 07/16/2015   CL 101 07/16/2015   CREATININE 0.88 07/16/2015   BUN 11 07/16/2015   CO2 29  07/16/2015   TSH 1.62 07/16/2015   HGBA1C 6.3* 07/01/2014    Lab Results  Component Value Date   TSH 1.62 07/16/2015   Lab Results  Component Value Date   WBC 11.9* 09/15/2014   HGB 13.9 09/15/2014   HCT 41.4 09/15/2014   MCV 87.9 09/15/2014   PLT 318 09/15/2014   Lab Results  Component Value Date   NA 139 07/16/2015   K 3.6 07/16/2015   CO2 29 07/16/2015   GLUCOSE 101* 07/16/2015   BUN 11 07/16/2015   CREATININE 0.88 07/16/2015   BILITOT 0.3 07/16/2015   ALKPHOS 96 07/16/2015   AST 30 07/16/2015   ALT 29 07/16/2015   PROT 7.3 07/16/2015   ALBUMIN 3.8 07/16/2015   CALCIUM 9.1 07/16/2015   ANIONGAP 6 09/15/2014   GFR 82.58 07/16/2015   Lab Results  Component Value Date   CHOL 193 07/01/2014   Lab Results  Component Value Date   HDL 47 07/01/2014   Lab Results  Component Value Date   LDLCALC 123* 07/01/2014   Lab Results  Component Value Date   TRIG 115 07/01/2014   Lab Results  Component Value Date   CHOLHDL 4.1 07/01/2014   Lab Results  Component Value Date   HGBA1C 6.3* 07/01/2014       Assessment & Plan:   Problem List Items Addressed This Visit    HTN (hypertension)    Well controlled, no changes to meds. Encouraged heart healthy diet such as the DASH diet and exercise as tolerated.       Left-sided low back pain with left-sided sciatica - Primary    Encouraged moist heat and gentle stretching as tolerated. May try NSAIDs and prescription meds as directed and report if symptoms worsen or seek immediate care. Lidocaine patches and  muscle relaxer for next few days. If no improvement will want to consider massage, chiropractic and PT      Relevant Medications   cyclobenzaprine (FLEXERIL) 10 MG tablet   Obesity (BMI 30-39.9)    Encouraged DASH diet, decrease po intake and increase exercise as tolerated. Needs 7-8 hours of sleep nightly. Avoid trans fats, eat small, frequent meals every 4-5 hours with lean proteins, complex carbs and healthy fats. Minimize simple carbs         I am having [name] start on cyclobenzaprine. I am also having her maintain her b complex-C-folic acid, zinc gluconate, loratadine, Cholecalciferol (CVS VIT D 5000 HIGH-POTENCY PO), OVER THE COUNTER MEDICATION, pseudoephedrine-acetaminophen, acetaminophen, nystatin-triamcinolone ointment, pantoprazole, fluticasone, albuterol, Fluticasone-Salmeterol, triamterene-hydrochlorothiazide, SYNTHROID, doxycycline, and predniSONE.  Meds ordered this encounter  Medications  . cyclobenzaprine (FLEXERIL) 10 MG tablet    Sig: Take 1 tablet (10 mg total) by mouth at bedtime as needed for muscle spasms.    Dispense:  30 tablet    Refill:  2     [name], MD

## 2015-10-06 NOTE — Progress Notes (Signed)
Pre visit review using our clinic review tool, if applicable. No additional management support is needed unless otherwise documented below in the visit note. 

## 2015-10-07 DIAGNOSIS — M5442 Lumbago with sciatica, left side: Secondary | ICD-10-CM | POA: Insufficient documentation

## 2015-10-07 NOTE — Assessment & Plan Note (Signed)
Encouraged DASH diet, decrease po intake and increase exercise as tolerated. Needs 7-8 hours of sleep nightly. Avoid trans fats, eat small, frequent meals every 4-5 hours with lean proteins, complex carbs and healthy fats. Minimize simple carbs 

## 2015-10-07 NOTE — Assessment & Plan Note (Signed)
Encouraged moist heat and gentle stretching as tolerated. May try NSAIDs and prescription meds as directed and report if symptoms worsen or seek immediate care. Lidocaine patches and muscle relaxer for next few days. If no improvement will want to consider massage, chiropractic and PT

## 2015-10-07 NOTE — Assessment & Plan Note (Signed)
Well controlled, no changes to meds. Encouraged heart healthy diet such as the DASH diet and exercise as tolerated.  °

## 2015-11-04 DIAGNOSIS — M19012 Primary osteoarthritis, left shoulder: Secondary | ICD-10-CM | POA: Diagnosis not present

## 2015-11-04 DIAGNOSIS — M7542 Impingement syndrome of left shoulder: Secondary | ICD-10-CM | POA: Diagnosis not present

## 2015-11-04 DIAGNOSIS — M24112 Other articular cartilage disorders, left shoulder: Secondary | ICD-10-CM | POA: Diagnosis not present

## 2015-11-04 DIAGNOSIS — S46012D Strain of muscle(s) and tendon(s) of the rotator cuff of left shoulder, subsequent encounter: Secondary | ICD-10-CM | POA: Diagnosis not present

## 2015-11-04 DIAGNOSIS — M75112 Incomplete rotator cuff tear or rupture of left shoulder, not specified as traumatic: Secondary | ICD-10-CM | POA: Diagnosis not present

## 2015-11-04 DIAGNOSIS — M66812 Spontaneous rupture of other tendons, left shoulder: Secondary | ICD-10-CM | POA: Diagnosis not present

## 2015-11-11 DIAGNOSIS — M25512 Pain in left shoulder: Secondary | ICD-10-CM | POA: Diagnosis not present

## 2015-11-11 DIAGNOSIS — Z9889 Other specified postprocedural states: Secondary | ICD-10-CM | POA: Diagnosis not present

## 2015-11-13 DIAGNOSIS — Z9889 Other specified postprocedural states: Secondary | ICD-10-CM | POA: Diagnosis not present

## 2015-11-13 DIAGNOSIS — M25512 Pain in left shoulder: Secondary | ICD-10-CM | POA: Diagnosis not present

## 2015-11-18 DIAGNOSIS — Z9889 Other specified postprocedural states: Secondary | ICD-10-CM | POA: Diagnosis not present

## 2015-11-18 DIAGNOSIS — M25512 Pain in left shoulder: Secondary | ICD-10-CM | POA: Diagnosis not present

## 2015-11-23 DIAGNOSIS — M25512 Pain in left shoulder: Secondary | ICD-10-CM | POA: Diagnosis not present

## 2015-11-23 DIAGNOSIS — Z9889 Other specified postprocedural states: Secondary | ICD-10-CM | POA: Diagnosis not present

## 2015-11-25 DIAGNOSIS — M25512 Pain in left shoulder: Secondary | ICD-10-CM | POA: Diagnosis not present

## 2015-11-25 DIAGNOSIS — Z9889 Other specified postprocedural states: Secondary | ICD-10-CM | POA: Diagnosis not present

## 2015-11-27 DIAGNOSIS — Z9889 Other specified postprocedural states: Secondary | ICD-10-CM | POA: Diagnosis not present

## 2015-11-27 DIAGNOSIS — M25512 Pain in left shoulder: Secondary | ICD-10-CM | POA: Diagnosis not present

## 2015-12-02 DIAGNOSIS — Z9889 Other specified postprocedural states: Secondary | ICD-10-CM | POA: Diagnosis not present

## 2015-12-02 DIAGNOSIS — M25512 Pain in left shoulder: Secondary | ICD-10-CM | POA: Diagnosis not present

## 2015-12-04 DIAGNOSIS — Z9889 Other specified postprocedural states: Secondary | ICD-10-CM | POA: Diagnosis not present

## 2015-12-04 DIAGNOSIS — M25512 Pain in left shoulder: Secondary | ICD-10-CM | POA: Diagnosis not present

## 2015-12-09 DIAGNOSIS — Z9889 Other specified postprocedural states: Secondary | ICD-10-CM | POA: Diagnosis not present

## 2015-12-09 DIAGNOSIS — M25512 Pain in left shoulder: Secondary | ICD-10-CM | POA: Diagnosis not present

## 2015-12-11 DIAGNOSIS — M25512 Pain in left shoulder: Secondary | ICD-10-CM | POA: Diagnosis not present

## 2015-12-11 DIAGNOSIS — Z9889 Other specified postprocedural states: Secondary | ICD-10-CM | POA: Diagnosis not present

## 2015-12-16 DIAGNOSIS — Z9889 Other specified postprocedural states: Secondary | ICD-10-CM | POA: Diagnosis not present

## 2015-12-16 DIAGNOSIS — M25512 Pain in left shoulder: Secondary | ICD-10-CM | POA: Diagnosis not present

## 2015-12-18 DIAGNOSIS — M25512 Pain in left shoulder: Secondary | ICD-10-CM | POA: Diagnosis not present

## 2015-12-18 DIAGNOSIS — Z9889 Other specified postprocedural states: Secondary | ICD-10-CM | POA: Diagnosis not present

## 2015-12-23 DIAGNOSIS — M25512 Pain in left shoulder: Secondary | ICD-10-CM | POA: Diagnosis not present

## 2015-12-23 DIAGNOSIS — Z9889 Other specified postprocedural states: Secondary | ICD-10-CM | POA: Diagnosis not present

## 2015-12-25 DIAGNOSIS — Z9889 Other specified postprocedural states: Secondary | ICD-10-CM | POA: Diagnosis not present

## 2015-12-25 DIAGNOSIS — M25512 Pain in left shoulder: Secondary | ICD-10-CM | POA: Diagnosis not present

## 2015-12-30 DIAGNOSIS — M25512 Pain in left shoulder: Secondary | ICD-10-CM | POA: Diagnosis not present

## 2015-12-30 DIAGNOSIS — Z9889 Other specified postprocedural states: Secondary | ICD-10-CM | POA: Diagnosis not present

## 2016-01-01 DIAGNOSIS — G8929 Other chronic pain: Secondary | ICD-10-CM | POA: Diagnosis not present

## 2016-01-01 DIAGNOSIS — Z9889 Other specified postprocedural states: Secondary | ICD-10-CM | POA: Diagnosis not present

## 2016-01-01 DIAGNOSIS — M25512 Pain in left shoulder: Secondary | ICD-10-CM | POA: Diagnosis not present

## 2016-01-06 DIAGNOSIS — Z9889 Other specified postprocedural states: Secondary | ICD-10-CM | POA: Diagnosis not present

## 2016-01-06 DIAGNOSIS — M25512 Pain in left shoulder: Secondary | ICD-10-CM | POA: Diagnosis not present

## 2016-01-06 DIAGNOSIS — G8929 Other chronic pain: Secondary | ICD-10-CM | POA: Diagnosis not present

## 2016-01-08 DIAGNOSIS — M25512 Pain in left shoulder: Secondary | ICD-10-CM | POA: Diagnosis not present

## 2016-01-08 DIAGNOSIS — Z9889 Other specified postprocedural states: Secondary | ICD-10-CM | POA: Diagnosis not present

## 2016-01-08 DIAGNOSIS — G8929 Other chronic pain: Secondary | ICD-10-CM | POA: Diagnosis not present

## 2016-01-13 DIAGNOSIS — M25512 Pain in left shoulder: Secondary | ICD-10-CM | POA: Diagnosis not present

## 2016-01-13 DIAGNOSIS — G8929 Other chronic pain: Secondary | ICD-10-CM | POA: Diagnosis not present

## 2016-01-13 DIAGNOSIS — Z9889 Other specified postprocedural states: Secondary | ICD-10-CM | POA: Diagnosis not present

## 2016-01-15 DIAGNOSIS — G8929 Other chronic pain: Secondary | ICD-10-CM | POA: Diagnosis not present

## 2016-01-15 DIAGNOSIS — Z9889 Other specified postprocedural states: Secondary | ICD-10-CM | POA: Diagnosis not present

## 2016-01-15 DIAGNOSIS — M25512 Pain in left shoulder: Secondary | ICD-10-CM | POA: Diagnosis not present

## 2016-01-18 ENCOUNTER — Ambulatory Visit (INDEPENDENT_AMBULATORY_CARE_PROVIDER_SITE_OTHER): Payer: BLUE CROSS/BLUE SHIELD | Admitting: Family Medicine

## 2016-01-18 ENCOUNTER — Encounter: Payer: Self-pay | Admitting: Family Medicine

## 2016-01-18 VITALS — BP 120/82 | HR 66 | Temp 97.7°F | Ht 63.0 in | Wt 216.0 lb

## 2016-01-18 DIAGNOSIS — Z0000 Encounter for general adult medical examination without abnormal findings: Secondary | ICD-10-CM | POA: Diagnosis not present

## 2016-01-18 DIAGNOSIS — E669 Obesity, unspecified: Secondary | ICD-10-CM

## 2016-01-18 DIAGNOSIS — E785 Hyperlipidemia, unspecified: Secondary | ICD-10-CM

## 2016-01-18 DIAGNOSIS — Z9889 Other specified postprocedural states: Secondary | ICD-10-CM | POA: Diagnosis not present

## 2016-01-18 DIAGNOSIS — Z23 Encounter for immunization: Secondary | ICD-10-CM | POA: Diagnosis not present

## 2016-01-18 DIAGNOSIS — R739 Hyperglycemia, unspecified: Secondary | ICD-10-CM

## 2016-01-18 DIAGNOSIS — E890 Postprocedural hypothyroidism: Secondary | ICD-10-CM | POA: Diagnosis not present

## 2016-01-18 DIAGNOSIS — K219 Gastro-esophageal reflux disease without esophagitis: Secondary | ICD-10-CM

## 2016-01-18 DIAGNOSIS — G8929 Other chronic pain: Secondary | ICD-10-CM | POA: Diagnosis not present

## 2016-01-18 DIAGNOSIS — M25512 Pain in left shoulder: Secondary | ICD-10-CM | POA: Diagnosis not present

## 2016-01-18 DIAGNOSIS — L989 Disorder of the skin and subcutaneous tissue, unspecified: Secondary | ICD-10-CM

## 2016-01-18 DIAGNOSIS — G4730 Sleep apnea, unspecified: Secondary | ICD-10-CM

## 2016-01-18 DIAGNOSIS — G4733 Obstructive sleep apnea (adult) (pediatric): Secondary | ICD-10-CM

## 2016-01-18 DIAGNOSIS — I10 Essential (primary) hypertension: Secondary | ICD-10-CM

## 2016-01-18 HISTORY — DX: Encounter for general adult medical examination without abnormal findings: Z00.00

## 2016-01-18 LAB — COMPREHENSIVE METABOLIC PANEL
ALT: 28 U/L (ref 0–35)
AST: 31 U/L (ref 0–37)
Albumin: 4 g/dL (ref 3.5–5.2)
Alkaline Phosphatase: 92 U/L (ref 39–117)
BUN: 14 mg/dL (ref 6–23)
CO2: 30 mEq/L (ref 19–32)
Calcium: 9.5 mg/dL (ref 8.4–10.5)
Chloride: 101 mEq/L (ref 96–112)
Creatinine, Ser: 0.9 mg/dL (ref 0.40–1.20)
GFR: 80.34 mL/min (ref >60.00)
Glucose, Bld: 87 mg/dL (ref 70–99)
Potassium: 3.4 mEq/L — ABNORMAL LOW (ref 3.5–5.1)
Sodium: 135 mEq/L (ref 135–145)
Total Bilirubin: 0.3 mg/dL (ref 0.2–1.2)
Total Protein: 7.3 g/dL (ref 6.0–8.3)

## 2016-01-18 LAB — CBC
HCT: 39.2 % (ref 36.0–46.0)
Hemoglobin: 13 g/dL (ref 12.0–15.0)
MCHC: 33.1 g/dL (ref 30.0–36.0)
MCV: 89.7 fl (ref 78.0–100.0)
Platelets: 325 10*3/uL (ref 150.0–400.0)
RBC: 4.37 Mil/uL (ref 3.87–5.11)
RDW: 14.2 % (ref 11.5–15.5)
WBC: 10.9 10*3/uL — ABNORMAL HIGH (ref 4.0–10.5)

## 2016-01-18 LAB — TSH: TSH: 2.57 u[IU]/mL (ref 0.35–4.50)

## 2016-01-18 LAB — LIPID PANEL
Cholesterol: 196 mg/dL (ref 0–200)
HDL: 52.3 mg/dL (ref >39.00)
LDL Cholesterol: 121 mg/dL — ABNORMAL HIGH (ref 0–99)
NonHDL: 143.5
Total CHOL/HDL Ratio: 4
Triglycerides: 115 mg/dL (ref 0.0–149.0)
VLDL: 23 mg/dL (ref 0.0–40.0)

## 2016-01-18 LAB — HEPATITIS C ANTIBODY: HCV Ab: NEGATIVE

## 2016-01-18 LAB — HEMOGLOBIN A1C: Hgb A1c MFr Bld: 6.2 % (ref 4.6–6.5)

## 2016-01-18 MED ORDER — TRIAMTERENE-HCTZ 75-50 MG PO TABS: 1.0000 | ORAL_TABLET | Freq: Every day | ORAL | Status: DC

## 2016-01-18 MED ORDER — SYNTHROID 150 MCG PO TABS: ORAL_TABLET | ORAL | Status: DC

## 2016-01-18 MED ORDER — FLUTICASONE PROPIONATE 50 MCG/ACT NA SUSP: 2.0000 | Freq: Every day | NASAL | Status: DC

## 2016-01-18 MED ORDER — FLUTICASONE-SALMETEROL 100-50 MCG/DOSE IN AEPB: 1.0000 | INHALATION_SPRAY | Freq: Two times a day (BID) | RESPIRATORY_TRACT | Status: DC

## 2016-01-18 MED ORDER — ZOSTER VACCINE LIVE 19400 UNT/0.65ML ~~LOC~~ SUSR: 0.6500 mL | Freq: Once | SUBCUTANEOUS | Status: DC

## 2016-01-18 NOTE — Assessment & Plan Note (Signed)
Encouraged heart healthy diet, increase exercise, avoid trans fats, consider a krill oil cap daily 

## 2016-01-18 NOTE — Assessment & Plan Note (Signed)
On Levothyroxine, continue to monitor 

## 2016-01-18 NOTE — Assessment & Plan Note (Signed)
Avoid offending foods, start probiotics. Do not eat large meals in late evening and consider raising head of bed.  

## 2016-01-18 NOTE — Assessment & Plan Note (Signed)
Well controlled, no changes to meds. Encouraged heart healthy diet such as the DASH diet and exercise as tolerated.  °

## 2016-01-18 NOTE — Progress Notes (Signed)
Pre visit review using our clinic review tool, if applicable. No additional management support is needed unless otherwise documented below in the visit note. 

## 2016-01-18 NOTE — Assessment & Plan Note (Signed)
Patient got her Bipap from [facility] but her insurance denied to pay for the Bipap because they said she needed to fail CPAP first but she was told she did fail it. She got mad at insurance so she stopped using it. she agrees to referral to pulmonology for ongoing care and equipment

## 2016-01-18 NOTE — Patient Instructions (Signed)
Preventive Care for Adults, Female A healthy lifestyle and preventive care can promote health and wellness. Preventive health guidelines for women include the following key practices.  A routine yearly physical is a good way to check with your health care provider about your health and preventive screening. It is a chance to share any concerns and updates on your health and to receive a thorough exam.  Visit your dentist for a routine exam and preventive care every 6 months. Brush your teeth twice a day and floss once a day. Good oral hygiene prevents tooth decay and gum disease.  The frequency of eye exams is based on your age, health, family medical history, use of contact lenses, and other factors. Follow your health care provider's recommendations for frequency of eye exams.  Eat a healthy diet. Foods like vegetables, fruits, whole grains, low-fat dairy products, and lean protein foods contain the nutrients you need without too many calories. Decrease your intake of foods high in solid fats, added sugars, and salt. Eat the right amount of calories for you.Get information about a proper diet from your health care provider, if necessary.  Regular physical exercise is one of the most important things you can do for your health. Most adults should get at least 150 minutes of moderate-intensity exercise (any activity that increases your heart rate and causes you to sweat) each week. In addition, most adults need muscle-strengthening exercises on 2 or more days a week.  Maintain a healthy weight. The body mass index (BMI) is a screening tool to identify possible weight problems. It provides an estimate of body fat based on height and weight. Your health care provider can find your BMI and can help you achieve or maintain a healthy weight.For adults 20 years and older:  A BMI below 18.5 is considered underweight.  A BMI of 18.5 to 24.9 is normal.  A BMI of 25 to 29.9 is considered overweight.  A  BMI of 30 and above is considered obese.  Maintain normal blood lipids and cholesterol levels by exercising and minimizing your intake of saturated fat. Eat a balanced diet with plenty of fruit and vegetables. Blood tests for lipids and cholesterol should begin at age 45 and be repeated every 5 years. If your lipid or cholesterol levels are high, you are over 50, or you are at high risk for heart disease, you may need your cholesterol levels checked more frequently.Ongoing high lipid and cholesterol levels should be treated with medicines if diet and exercise are not working.  If you smoke, find out from your health care provider how to quit. If you do not use tobacco, do not start.  Lung cancer screening is recommended for adults aged 45-80 years who are at high risk for developing lung cancer because of a history of smoking. A yearly low-dose CT scan of the lungs is recommended for people who have at least a 30-pack-year history of smoking and are a current smoker or have quit within the past 15 years. A pack year of smoking is smoking an average of 1 pack of cigarettes a day for 1 year (for example: 1 pack a day for 30 years or 2 packs a day for 15 years). Yearly screening should continue until the smoker has stopped smoking for at least 15 years. Yearly screening should be stopped for people who develop a health problem that would prevent them from having lung cancer treatment.  If you are pregnant, do not drink alcohol. If you are  breastfeeding, be very cautious about drinking alcohol. If you are not pregnant and choose to drink alcohol, do not have more than 1 drink per day. One drink is considered to be 12 ounces (355 mL) of beer, 5 ounces (148 mL) of wine, or 1.5 ounces (44 mL) of liquor.  Avoid use of street drugs. Do not share needles with anyone. Ask for help if you need support or instructions about stopping the use of drugs.  High blood pressure causes heart disease and increases the risk  of stroke. Your blood pressure should be checked at least every 1 to 2 years. Ongoing high blood pressure should be treated with medicines if weight loss and exercise do not work.  If you are 55-79 years old, ask your health care provider if you should take aspirin to prevent strokes.  Diabetes screening is done by taking a blood sample to check your blood glucose level after you have not eaten for a certain period of time (fasting). If you are not overweight and you do not have risk factors for diabetes, you should be screened once every 3 years starting at age 45. If you are overweight or obese and you are 40-70 years of age, you should be screened for diabetes every year as part of your cardiovascular risk assessment.  Breast cancer screening is essential preventive care for women. You should practice "breast self-awareness." This means understanding the normal appearance and feel of your breasts and may include breast self-examination. Any changes detected, no matter how small, should be reported to a health care provider. Women in their 20s and 30s should have a clinical breast exam (CBE) by a health care provider as part of a regular health exam every 1 to 3 years. After age 40, women should have a CBE every year. Starting at age 40, women should consider having a mammogram (breast X-ray test) every year. Women who have a family history of breast cancer should talk to their health care provider about genetic screening. Women at a high risk of breast cancer should talk to their health care providers about having an MRI and a mammogram every year.  Breast cancer gene (BRCA)-related cancer risk assessment is recommended for women who have family members with BRCA-related cancers. BRCA-related cancers include breast, ovarian, tubal, and peritoneal cancers. Having family members with these cancers may be associated with an increased risk for harmful changes (mutations) in the breast cancer genes BRCA1 and  BRCA2. Results of the assessment will determine the need for genetic counseling and BRCA1 and BRCA2 testing.  Your health care provider may recommend that you be screened regularly for cancer of the pelvic organs (ovaries, uterus, and vagina). This screening involves a pelvic examination, including checking for microscopic changes to the surface of your cervix (Pap test). You may be encouraged to have this screening done every 3 years, beginning at age 21.  For women ages 30-65, health care providers may recommend pelvic exams and Pap testing every 3 years, or they may recommend the Pap and pelvic exam, combined with testing for human papilloma virus (HPV), every 5 years. Some types of HPV increase your risk of cervical cancer. Testing for HPV may also be done on women of any age with unclear Pap test results.  Other health care providers may not recommend any screening for nonpregnant women who are considered low risk for pelvic cancer and who do not have symptoms. Ask your health care provider if a screening pelvic exam is right for   you.  If you have had past treatment for cervical cancer or a condition that could lead to cancer, you need Pap tests and screening for cancer for at least 20 years after your treatment. If Pap tests have been discontinued, your risk factors (such as having a new sexual partner) need to be reassessed to determine if screening should resume. Some women have medical problems that increase the chance of getting cervical cancer. In these cases, your health care provider may recommend more frequent screening and Pap tests.  Colorectal cancer can be detected and often prevented. Most routine colorectal cancer screening begins at the age of 50 years and continues through age 75 years. However, your health care provider may recommend screening at an earlier age if you have risk factors for colon cancer. On a yearly basis, your health care provider may provide home test kits to check  for hidden blood in the stool. Use of a small camera at the end of a tube, to directly examine the colon (sigmoidoscopy or colonoscopy), can detect the earliest forms of colorectal cancer. Talk to your health care provider about this at age 50, when routine screening begins. Direct exam of the colon should be repeated every 5-10 years through age 75 years, unless early forms of precancerous polyps or small growths are found.  People who are at an increased risk for hepatitis B should be screened for this virus. You are considered at high risk for hepatitis B if:  You were born in a country where hepatitis B occurs often. Talk with your health care provider about which countries are considered high risk.  Your parents were born in a high-risk country and you have not received a shot to protect against hepatitis B (hepatitis B vaccine).  You have HIV or AIDS.  You use needles to inject street drugs.  You live with, or have sex with, someone who has hepatitis B.  You get hemodialysis treatment.  You take certain medicines for conditions like cancer, organ transplantation, and autoimmune conditions.  Hepatitis C blood testing is recommended for all people born from 1945 through 1965 and any individual with known risks for hepatitis C.  Practice safe sex. Use condoms and avoid high-risk sexual practices to reduce the spread of sexually transmitted infections (STIs). STIs include gonorrhea, chlamydia, syphilis, trichomonas, herpes, HPV, and human immunodeficiency virus (HIV). Herpes, HIV, and HPV are viral illnesses that have no cure. They can result in disability, cancer, and death.  You should be screened for sexually transmitted illnesses (STIs) including gonorrhea and chlamydia if:  You are sexually active and are younger than 24 years.  You are older than 24 years and your health care provider tells you that you are at risk for this type of infection.  Your sexual activity has changed  since you were last screened and you are at an increased risk for chlamydia or gonorrhea. Ask your health care provider if you are at risk.  If you are at risk of being infected with HIV, it is recommended that you take a prescription medicine daily to prevent HIV infection. This is called preexposure prophylaxis (PrEP). You are considered at risk if:  You are sexually active and do not regularly use condoms or know the HIV status of your partner(s).  You take drugs by injection.  You are sexually active with a partner who has HIV.  Talk with your health care provider about whether you are at high risk of being infected with HIV. If   you choose to begin PrEP, you should first be tested for HIV. You should then be tested every 3 months for as long as you are taking PrEP.  Osteoporosis is a disease in which the bones lose minerals and strength with aging. This can result in serious bone fractures or breaks. The risk of osteoporosis can be identified using a bone density scan. Women ages 67 years and over and women at risk for fractures or osteoporosis should discuss screening with their health care providers. Ask your health care provider whether you should take a calcium supplement or vitamin D to reduce the rate of osteoporosis.  Menopause can be associated with physical symptoms and risks. Hormone replacement therapy is available to decrease symptoms and risks. You should talk to your health care provider about whether hormone replacement therapy is right for you.  Use sunscreen. Apply sunscreen liberally and repeatedly throughout the day. You should seek shade when your shadow is shorter than you. Protect yourself by wearing long sleeves, pants, a wide-brimmed hat, and sunglasses year round, whenever you are outdoors.  Once a month, do a whole body skin exam, using a mirror to look at the skin on your back. Tell your health care provider of new moles, moles that have irregular borders, moles that  are larger than a pencil eraser, or moles that have changed in shape or color.  Stay current with required vaccines (immunizations).  Influenza vaccine. All adults should be immunized every year.  Tetanus, diphtheria, and acellular pertussis (Td, Tdap) vaccine. Pregnant women should receive 1 dose of Tdap vaccine during each pregnancy. The dose should be obtained regardless of the length of time since the last dose. Immunization is preferred during the 27th-36th week of gestation. An adult who has not previously received Tdap or who does not know her vaccine status should receive 1 dose of Tdap. This initial dose should be followed by tetanus and diphtheria toxoids (Td) booster doses every 10 years. Adults with an unknown or incomplete history of completing a 3-dose immunization series with Td-containing vaccines should begin or complete a primary immunization series including a Tdap dose. Adults should receive a Td booster every 10 years.  Varicella vaccine. An adult without evidence of immunity to varicella should receive 2 doses or a second dose if she has previously received 1 dose. Pregnant females who do not have evidence of immunity should receive the first dose after pregnancy. This first dose should be obtained before leaving the health care facility. The second dose should be obtained 4-8 weeks after the first dose.  Human papillomavirus (HPV) vaccine. Females aged 13-26 years who have not received the vaccine previously should obtain the 3-dose series. The vaccine is not recommended for use in pregnant females. However, pregnancy testing is not needed before receiving a dose. If a female is found to be pregnant after receiving a dose, no treatment is needed. In that case, the remaining doses should be delayed until after the pregnancy. Immunization is recommended for any person with an immunocompromised condition through the age of 61 years if she did not get any or all doses earlier. During the  3-dose series, the second dose should be obtained 4-8 weeks after the first dose. The third dose should be obtained 24 weeks after the first dose and 16 weeks after the second dose.  Zoster vaccine. One dose is recommended for adults aged 30 years or older unless certain conditions are present.  Measles, mumps, and rubella (MMR) vaccine. Adults born  before 1957 generally are considered immune to measles and mumps. Adults born in 1957 or later should have 1 or more doses of MMR vaccine unless there is a contraindication to the vaccine or there is laboratory evidence of immunity to each of the three diseases. A routine second dose of MMR vaccine should be obtained at least 28 days after the first dose for students attending postsecondary schools, health care workers, or international travelers. People who received inactivated measles vaccine or an unknown type of measles vaccine during 1963-1967 should receive 2 doses of MMR vaccine. People who received inactivated mumps vaccine or an unknown type of mumps vaccine before 1979 and are at high risk for mumps infection should consider immunization with 2 doses of MMR vaccine. For females of childbearing age, rubella immunity should be determined. If there is no evidence of immunity, females who are not pregnant should be vaccinated. If there is no evidence of immunity, females who are pregnant should delay immunization until after pregnancy. Unvaccinated health care workers born before 1957 who lack laboratory evidence of measles, mumps, or rubella immunity or laboratory confirmation of disease should consider measles and mumps immunization with 2 doses of MMR vaccine or rubella immunization with 1 dose of MMR vaccine.  Pneumococcal 13-valent conjugate (PCV13) vaccine. When indicated, a person who is uncertain of his immunization history and has no record of immunization should receive the PCV13 vaccine. All adults 65 years of age and older should receive this  vaccine. An adult aged 19 years or older who has certain medical conditions and has not been previously immunized should receive 1 dose of PCV13 vaccine. This PCV13 should be followed with a dose of pneumococcal polysaccharide (PPSV23) vaccine. Adults who are at high risk for pneumococcal disease should obtain the PPSV23 vaccine at least 8 weeks after the dose of PCV13 vaccine. Adults older than 67 years of age who have normal immune system function should obtain the PPSV23 vaccine dose at least 1 year after the dose of PCV13 vaccine.  Pneumococcal polysaccharide (PPSV23) vaccine. When PCV13 is also indicated, PCV13 should be obtained first. All adults aged 65 years and older should be immunized. An adult younger than age 65 years who has certain medical conditions should be immunized. Any person who resides in a nursing home or long-term care facility should be immunized. An adult smoker should be immunized. People with an immunocompromised condition and certain other conditions should receive both PCV13 and PPSV23 vaccines. People with human immunodeficiency virus (HIV) infection should be immunized as soon as possible after diagnosis. Immunization during chemotherapy or radiation therapy should be avoided. Routine use of PPSV23 vaccine is not recommended for American Indians, [place] Natives, or people younger than 65 years unless there are medical conditions that require PPSV23 vaccine. When indicated, people who have unknown immunization and have no record of immunization should receive PPSV23 vaccine. One-time revaccination 5 years after the first dose of PPSV23 is recommended for people aged 19-64 years who have chronic kidney failure, nephrotic syndrome, asplenia, or immunocompromised conditions. People who received 1-2 doses of PPSV23 before age 65 years should receive another dose of PPSV23 vaccine at age 65 years or later if at least 5 years have passed since the previous dose. Doses of PPSV23 are not  needed for people immunized with PPSV23 at or after age 65 years.  Meningococcal vaccine. Adults with asplenia or persistent complement component deficiencies should receive 2 doses of quadrivalent meningococcal conjugate (MenACWY-D) vaccine. The doses should be obtained   at least 2 months apart. Microbiologists working with certain meningococcal bacteria, [place] recruits, people at risk during an outbreak, and people who travel to or live in countries with a high rate of meningitis should be immunized. A first-year college student up through age 34 years who is living in a residence hall should receive a dose if she did not receive a dose on or after her 16th birthday. Adults who have certain high-risk conditions should receive one or more doses of vaccine.  Hepatitis A vaccine. Adults who wish to be protected from this disease, have certain high-risk conditions, work with hepatitis A-infected animals, work in hepatitis A research labs, or travel to or work in countries with a high rate of hepatitis A should be immunized. Adults who were previously unvaccinated and who anticipate close contact with an international adoptee during the first 60 days after arrival in the [place] from a country with a high rate of hepatitis A should be immunized.  Hepatitis B vaccine. Adults who wish to be protected from this disease, have certain high-risk conditions, may be exposed to blood or other infectious body fluids, are household contacts or sex partners of hepatitis B positive people, are clients or workers in certain care facilities, or travel to or work in countries with a high rate of hepatitis B should be immunized.  Haemophilus influenzae type b (Hib) vaccine. A previously unvaccinated person with asplenia or sickle cell disease or having a scheduled splenectomy should receive 1 dose of Hib vaccine. Regardless of previous immunization, a recipient of a hematopoietic stem cell transplant should receive a  3-dose series 6-12 months after her successful transplant. Hib vaccine is not recommended for adults with HIV infection. Preventive Services / Frequency Ages 35 to 4 years  Blood pressure check.** / Every 3-5 years.  Lipid and cholesterol check.** / Every 5 years beginning at age 60.  Clinical breast exam.** / Every 3 years for women in their 71s and 10s.  BRCA-related cancer risk assessment.** / For women who have family members with a BRCA-related cancer (breast, ovarian, tubal, or peritoneal cancers).  Pap test.** / Every 2 years from ages 76 through 26. Every 3 years starting at age 61 through age 76 or 93 with a history of 3 consecutive normal Pap tests.  HPV screening.** / Every 3 years from ages 37 through ages 60 to 51 with a history of 3 consecutive normal Pap tests.  Hepatitis C blood test.** / For any individual with known risks for hepatitis C.  Skin self-exam. / Monthly.  Influenza vaccine. / Every year.  Tetanus, diphtheria, and acellular pertussis (Tdap, Td) vaccine.** / Consult your health care provider. Pregnant women should receive 1 dose of Tdap vaccine during each pregnancy. 1 dose of Td every 10 years.  Varicella vaccine.** / Consult your health care provider. Pregnant females who do not have evidence of immunity should receive the first dose after pregnancy.  HPV vaccine. / 3 doses over 6 months, if 93 and younger. The vaccine is not recommended for use in pregnant females. However, pregnancy testing is not needed before receiving a dose.  Measles, mumps, rubella (MMR) vaccine.** / You need at least 1 dose of MMR if you were born in 1957 or later. You may also need a 2nd dose. For females of childbearing age, rubella immunity should be determined. If there is no evidence of immunity, females who are not pregnant should be vaccinated. If there is no evidence of immunity, females who are  pregnant should delay immunization until after pregnancy.  Pneumococcal  13-valent conjugate (PCV13) vaccine.** / Consult your health care provider.  Pneumococcal polysaccharide (PPSV23) vaccine.** / 1 to 2 doses if you smoke cigarettes or if you have certain conditions.  Meningococcal vaccine.** / 1 dose if you are age 68 to 8 years and a Market researcher living in a residence hall, or have one of several medical conditions, you need to get vaccinated against meningococcal disease. You may also need additional booster doses.  Hepatitis A vaccine.** / Consult your health care provider.  Hepatitis B vaccine.** / Consult your health care provider.  Haemophilus influenzae type b (Hib) vaccine.** / Consult your health care provider. Ages 7 to 53 years  Blood pressure check.** / Every year.  Lipid and cholesterol check.** / Every 5 years beginning at age 25 years.  Lung cancer screening. / Every year if you are aged 11-80 years and have a 30-pack-year history of smoking and currently smoke or have quit within the past 15 years. Yearly screening is stopped once you have quit smoking for at least 15 years or develop a health problem that would prevent you from having lung cancer treatment.  Clinical breast exam.** / Every year after age 48 years.  BRCA-related cancer risk assessment.** / For women who have family members with a BRCA-related cancer (breast, ovarian, tubal, or peritoneal cancers).  Mammogram.** / Every year beginning at age 41 years and continuing for as long as you are in good health. Consult with your health care provider.  Pap test.** / Every 3 years starting at age 65 years through age 37 or 70 years with a history of 3 consecutive normal Pap tests.  HPV screening.** / Every 3 years from ages 72 years through ages 60 to 40 years with a history of 3 consecutive normal Pap tests.  Fecal occult blood test (FOBT) of stool. / Every year beginning at age 21 years and continuing until age 5 years. You may not need to do this test if you get  a colonoscopy every 10 years.  Flexible sigmoidoscopy or colonoscopy.** / Every 5 years for a flexible sigmoidoscopy or every 10 years for a colonoscopy beginning at age 35 years and continuing until age 48 years.  Hepatitis C blood test.** / For all people born from [birth year] through 1965 and any individual with known risks for hepatitis C.  Skin self-exam. / Monthly.  Influenza vaccine. / Every year.  Tetanus, diphtheria, and acellular pertussis (Tdap/Td) vaccine.** / Consult your health care provider. Pregnant women should receive 1 dose of Tdap vaccine during each pregnancy. 1 dose of Td every 10 years.  Varicella vaccine.** / Consult your health care provider. Pregnant females who do not have evidence of immunity should receive the first dose after pregnancy.  Zoster vaccine.** / 1 dose for adults aged 30 years or older.  Measles, mumps, rubella (MMR) vaccine.** / You need at least 1 dose of MMR if you were born in 1957 or later. You may also need a second dose. For females of childbearing age, rubella immunity should be determined. If there is no evidence of immunity, females who are not pregnant should be vaccinated. If there is no evidence of immunity, females who are pregnant should delay immunization until after pregnancy.  Pneumococcal 13-valent conjugate (PCV13) vaccine.** / Consult your health care provider.  Pneumococcal polysaccharide (PPSV23) vaccine.** / 1 to 2 doses if you smoke cigarettes or if you have certain conditions.  Meningococcal vaccine.** /  Consult your health care provider.  Hepatitis A vaccine.** / Consult your health care provider.  Hepatitis B vaccine.** / Consult your health care provider.  Haemophilus influenzae type b (Hib) vaccine.** / Consult your health care provider. Ages 64 years and over  Blood pressure check.** / Every year.  Lipid and cholesterol check.** / Every 5 years beginning at age 23 years.  Lung cancer screening. / Every year if you  are aged 16-80 years and have a 30-pack-year history of smoking and currently smoke or have quit within the past 15 years. Yearly screening is stopped once you have quit smoking for at least 15 years or develop a health problem that would prevent you from having lung cancer treatment.  Clinical breast exam.** / Every year after age 74 years.  BRCA-related cancer risk assessment.** / For women who have family members with a BRCA-related cancer (breast, ovarian, tubal, or peritoneal cancers).  Mammogram.** / Every year beginning at age 44 years and continuing for as long as you are in good health. Consult with your health care provider.  Pap test.** / Every 3 years starting at age 58 years through age 22 or 39 years with 3 consecutive normal Pap tests. Testing can be stopped between 65 and 70 years with 3 consecutive normal Pap tests and no abnormal Pap or HPV tests in the past 10 years.  HPV screening.** / Every 3 years from ages 64 years through ages 70 or 61 years with a history of 3 consecutive normal Pap tests. Testing can be stopped between 65 and 70 years with 3 consecutive normal Pap tests and no abnormal Pap or HPV tests in the past 10 years.  Fecal occult blood test (FOBT) of stool. / Every year beginning at age 40 years and continuing until age 27 years. You may not need to do this test if you get a colonoscopy every 10 years.  Flexible sigmoidoscopy or colonoscopy.** / Every 5 years for a flexible sigmoidoscopy or every 10 years for a colonoscopy beginning at age 7 years and continuing until age 32 years.  Hepatitis C blood test.** / For all people born from [birth year] through 1967 and any individual with known risks for hepatitis C.  Osteoporosis screening.** / A one-time screening for women ages 30 years and over and women at risk for fractures or osteoporosis.  Skin self-exam. / Monthly.  Influenza vaccine. / Every year.  Tetanus, diphtheria, and acellular pertussis (Tdap/Td)  vaccine.** / 1 dose of Td every 10 years.  Varicella vaccine.** / Consult your health care provider.  Zoster vaccine.** / 1 dose for adults aged 35 years or older.  Pneumococcal 13-valent conjugate (PCV13) vaccine.** / Consult your health care provider.  Pneumococcal polysaccharide (PPSV23) vaccine.** / 1 dose for all adults aged 46 years and older.  Meningococcal vaccine.** / Consult your health care provider.  Hepatitis A vaccine.** / Consult your health care provider.  Hepatitis B vaccine.** / Consult your health care provider.  Haemophilus influenzae type b (Hib) vaccine.** / Consult your health care provider. ** Family history and personal history of risk and conditions may change your health care provider's recommendations.   This information is not intended to replace advice given to you by your health care provider. Make sure you discuss any questions you have with your health care provider.   Document Released: 08/30/2001 Document Revised: 07/25/2014 Document Reviewed: 11/29/2010 Elsevier Interactive Patient Education [employer].

## 2016-01-18 NOTE — Assessment & Plan Note (Signed)
Encouraged DASH diet, decrease po intake and increase exercise as tolerated. Needs 7-8 hours of sleep nightly. Avoid trans fats, eat small, frequent meals every 4-5 hours with lean proteins, complex carbs and healthy fats. Minimize simple carbs 

## 2016-01-22 DIAGNOSIS — G8929 Other chronic pain: Secondary | ICD-10-CM | POA: Diagnosis not present

## 2016-01-22 DIAGNOSIS — M25512 Pain in left shoulder: Secondary | ICD-10-CM | POA: Diagnosis not present

## 2016-01-22 DIAGNOSIS — Z9889 Other specified postprocedural states: Secondary | ICD-10-CM | POA: Diagnosis not present

## 2016-01-24 DIAGNOSIS — E119 Type 2 diabetes mellitus without complications: Secondary | ICD-10-CM | POA: Insufficient documentation

## 2016-01-24 DIAGNOSIS — E1169 Type 2 diabetes mellitus with other specified complication: Secondary | ICD-10-CM | POA: Insufficient documentation

## 2016-01-24 DIAGNOSIS — R739 Hyperglycemia, unspecified: Secondary | ICD-10-CM | POA: Insufficient documentation

## 2016-01-24 DIAGNOSIS — L989 Disorder of the skin and subcutaneous tissue, unspecified: Secondary | ICD-10-CM | POA: Insufficient documentation
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
## 2016-01-24 NOTE — Assessment & Plan Note (Addendum)
Patient encouraged to maintain heart healthy diet, regular exercise, adequate sleep. Consider daily probiotics. Take medications as prescribed. Given and reviewed copy of ACP documents from [employer] and encouraged to complete and return Labs reviewed, given pneumonia shot today

## 2016-01-24 NOTE — Assessment & Plan Note (Signed)
Referred to dermatology for further consideration.  
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
## 2016-01-27 DIAGNOSIS — M25512 Pain in left shoulder: Secondary | ICD-10-CM | POA: Diagnosis not present

## 2016-01-27 DIAGNOSIS — Z9889 Other specified postprocedural states: Secondary | ICD-10-CM | POA: Diagnosis not present

## 2016-01-27 DIAGNOSIS — G8929 Other chronic pain: Secondary | ICD-10-CM | POA: Diagnosis not present

## 2016-01-28 ENCOUNTER — Other Ambulatory Visit: Payer: Self-pay | Admitting: Medical

## 2016-01-28 MED ORDER — TRIAMTERENE-HCTZ 75-50 MG PO TABS: 1.0000 | ORAL_TABLET | Freq: Every day | ORAL | Status: DC

## 2016-01-28 NOTE — Addendum Note (Signed)
Addended by: Sharon Seller B on: 01/28/2016 03:28 PM   Modules accepted: Orders

## 2016-01-28 NOTE — Telephone Encounter (Signed)
Pt called in to follow up on refill request. She says that she will be going out of town tomorrow and would like to know if this request could be handled right away?

## 2016-01-28 NOTE — Telephone Encounter (Signed)
Sent in maxzide to [facility].  Patient aware/

## 2016-02-02 DIAGNOSIS — Z9889 Other specified postprocedural states: Secondary | ICD-10-CM | POA: Diagnosis not present

## 2016-02-02 DIAGNOSIS — G8929 Other chronic pain: Secondary | ICD-10-CM | POA: Diagnosis not present

## 2016-02-02 DIAGNOSIS — M25512 Pain in left shoulder: Secondary | ICD-10-CM | POA: Diagnosis not present

## 2016-02-04 DIAGNOSIS — G8929 Other chronic pain: Secondary | ICD-10-CM | POA: Diagnosis not present

## 2016-02-04 DIAGNOSIS — M25512 Pain in left shoulder: Secondary | ICD-10-CM | POA: Diagnosis not present

## 2016-02-04 DIAGNOSIS — Z9889 Other specified postprocedural states: Secondary | ICD-10-CM | POA: Diagnosis not present

## 2016-02-11 DIAGNOSIS — S46012D Strain of muscle(s) and tendon(s) of the rotator cuff of left shoulder, subsequent encounter: Secondary | ICD-10-CM | POA: Diagnosis not present

## 2016-02-12 DIAGNOSIS — Z9889 Other specified postprocedural states: Secondary | ICD-10-CM | POA: Diagnosis not present

## 2016-02-12 DIAGNOSIS — G8929 Other chronic pain: Secondary | ICD-10-CM | POA: Diagnosis not present

## 2016-02-12 DIAGNOSIS — M25512 Pain in left shoulder: Secondary | ICD-10-CM | POA: Diagnosis not present

## 2016-02-16 DIAGNOSIS — Z9889 Other specified postprocedural states: Secondary | ICD-10-CM | POA: Diagnosis not present

## 2016-02-16 DIAGNOSIS — G8929 Other chronic pain: Secondary | ICD-10-CM | POA: Diagnosis not present

## 2016-02-16 DIAGNOSIS — M25512 Pain in left shoulder: Secondary | ICD-10-CM | POA: Diagnosis not present

## 2016-02-18 DIAGNOSIS — M25512 Pain in left shoulder: Secondary | ICD-10-CM | POA: Diagnosis not present

## 2016-02-18 DIAGNOSIS — Z9889 Other specified postprocedural states: Secondary | ICD-10-CM | POA: Diagnosis not present

## 2016-02-18 DIAGNOSIS — G8929 Other chronic pain: Secondary | ICD-10-CM | POA: Diagnosis not present

## 2016-02-24 DIAGNOSIS — G8929 Other chronic pain: Secondary | ICD-10-CM | POA: Diagnosis not present

## 2016-02-24 DIAGNOSIS — M25512 Pain in left shoulder: Secondary | ICD-10-CM | POA: Diagnosis not present

## 2016-03-01 DIAGNOSIS — Z9889 Other specified postprocedural states: Secondary | ICD-10-CM | POA: Diagnosis not present

## 2016-03-01 DIAGNOSIS — G8929 Other chronic pain: Secondary | ICD-10-CM | POA: Diagnosis not present

## 2016-03-01 DIAGNOSIS — M25512 Pain in left shoulder: Secondary | ICD-10-CM | POA: Diagnosis not present

## 2016-03-02 ENCOUNTER — Other Ambulatory Visit: Payer: Self-pay | Admitting: Medical

## 2016-03-15 DIAGNOSIS — H2512 Age-related nuclear cataract, left eye: Secondary | ICD-10-CM | POA: Diagnosis not present

## 2016-03-15 DIAGNOSIS — H02839 Dermatochalasis of unspecified eye, unspecified eyelid: Secondary | ICD-10-CM | POA: Diagnosis not present

## 2016-03-15 DIAGNOSIS — H2513 Age-related nuclear cataract, bilateral: Secondary | ICD-10-CM | POA: Diagnosis not present

## 2016-03-15 DIAGNOSIS — H18413 Arcus senilis, bilateral: Secondary | ICD-10-CM | POA: Diagnosis not present

## 2016-03-25 ENCOUNTER — Institutional Professional Consult (permissible substitution): Payer: Medicare Other | Admitting: Pulmonary Disease

## 2016-04-01 ENCOUNTER — Other Ambulatory Visit: Payer: Self-pay | Admitting: Medical

## 2016-05-02 ENCOUNTER — Other Ambulatory Visit: Payer: Self-pay | Admitting: Family Medicine

## 2016-05-19 DIAGNOSIS — Z23 Encounter for immunization: Secondary | ICD-10-CM | POA: Diagnosis not present

## 2016-06-01 ENCOUNTER — Encounter: Payer: Self-pay | Admitting: Pulmonary Disease

## 2016-06-01 ENCOUNTER — Ambulatory Visit (INDEPENDENT_AMBULATORY_CARE_PROVIDER_SITE_OTHER): Payer: Medicare Other | Admitting: Pulmonary Disease

## 2016-06-01 VITALS — BP 124/80 | HR 99 | Ht 63.0 in | Wt 210.8 lb

## 2016-06-01 DIAGNOSIS — E669 Obesity, unspecified: Secondary | ICD-10-CM

## 2016-06-01 DIAGNOSIS — G4733 Obstructive sleep apnea (adult) (pediatric): Secondary | ICD-10-CM

## 2016-06-01 DIAGNOSIS — J45909 Unspecified asthma, uncomplicated: Secondary | ICD-10-CM | POA: Diagnosis not present

## 2016-06-01 NOTE — Progress Notes (Signed)
Subjective:    Patient ID: Monica Wallace, female    DOB: 08/21/1950, 65 y.o.   MRN: [id number]  HPI   This is the case of Monica Wallace, 65 y.o. Female, who was referred by Dr. [name]  in consultation regarding OSA.   Patient has snoring, witnessed apneas, gasping, choking. Sleeps 6-7 hrs per night. Feels refreshed waking up. Occasional sleepiness in pm.  Hypersomnia affects fxnality.   Patient ended up seeing Dr. West Bali. Patient had a diagnostic lab study in March 2016. Her AHI was 6.7. She had a CPAP titration study in July 2017 and raw data showed she was adequate on cpap 6 cm water. Unfortunately, I could not see the official reports on the diagnostic and therapeutic sleep studies. Per Dr. Evette Georges note, patient's sleep apnea was not adequately treated on CPAP, especially during REM sleep. Patient was started on BiPAP. She felt better using the BiPAP. Unfortunately, insurance did not approve the BiPAP and was told she needed a CPAP. DME asked Dr. Claiborne Billings to reconsider CPAP for the patient. Patient eventually was not able to follow-up. She turned in her BiPAP as it was not covered by insurance.  Her symptoms of persistent. She has hypersomnia affecting her functionality. She felt better when she was using the BiPAP.  Non smoker.  She has "asthmatic bronchitis", usually better with Abx.  Uses prn alb nebulizer and MDI.   Has GERD. Stable.   Review of Systems  Constitutional: Negative.  Negative for fever and unexpected weight change.  HENT: Positive for congestion, sneezing and sore throat. Negative for dental problem, ear pain, nosebleeds, postnasal drip, rhinorrhea, sinus pressure and trouble swallowing.   Eyes: Positive for itching. Negative for redness.  Respiratory: Positive for cough. Negative for chest tightness, shortness of breath and wheezing.   Cardiovascular: Negative.  Negative for palpitations and leg swelling.  Gastrointestinal: Negative for nausea and vomiting.    Endocrine: Negative.   Genitourinary: Negative.  Negative for dysuria.  Musculoskeletal: Positive for joint swelling.  Skin: Negative.  Negative for rash.  Allergic/Immunologic: Positive for environmental allergies.  Neurological: Positive for dizziness. Negative for headaches.  Hematological: Does not bruise/bleed easily.  Psychiatric/Behavioral: Negative.  Negative for dysphoric mood. The patient is not nervous/anxious.    Past Medical History:  Diagnosis Date  . Arthritis   . Cancer ([place])    thyroid (2002)  . Cystic breast   . GERD (gastroesophageal reflux disease)   . Hepatic cyst 02/02/2015  . Hyperlipidemia   . Hypertension   . Menopause   . Migraine   . Palpitations   . Preventative health care 01/18/2016  . Sleep apnea 02/02/2015  . Thyroid disease    Thyroid CA for which she had thyroidectomy. In remission.  (-) DVT.   Family History  Problem Relation Age of Onset  . Arthritis Mother   . Transient ischemic attack Mother   . Hypertension Mother   . Hyperlipidemia Mother   . Diabetes Father   . Heart disease Father   . Arthritis Father   . Kidney disease Father   . Hypertension Father   . Hyperlipidemia Father   . Cancer Sister 34    pancreatic  . Fibromyalgia Sister   . Asthma Maternal Grandfather     smoker  . Depression Maternal Grandfather   . Dementia Paternal Grandmother   . Diabetes Paternal Grandfather   . Hypertension Brother   . Scoliosis Daughter     had rods placed and removed  .  Arthritis Sister   . Gout Sister   . GER disease Sister   . Cancer Sister     breast cancer     Past Surgical History:  Procedure Laterality Date  . ABDOMINAL HYSTERECTOMY     took right ovary and uterus  . APPENDECTOMY    . CARPAL TUNNEL RELEASE Right   . CESAREAN SECTION    . CHOLECYSTECTOMY    . KNEE ARTHROPLASTY      Social History   Social History  . Marital status: Divorced    Spouse name: N/A  . Number of children: N/A  . Years of education:  N/A   Occupational History  . Not on file.   Social History Main Topics  . Smoking status: Never Smoker  . Smokeless tobacco: Never Used  . Alcohol use No  . Drug use: No  . Sexual activity: No     Comment: Hysterectomy, lives with mother   Other Topics Concern  . Not on file   Social History Narrative  . No narrative on file   Is a nurse, was a HHM. (-) ETOH. Lives in [place].   Allergies  Allergen Reactions  . Codeine   . Levaquin [Levofloxacin]   . Penicillins      Outpatient Medications Prior to Visit  Medication Sig Dispense Refill  . acetaminophen (TYLENOL) 325 MG tablet Take 650 mg by mouth every 6 (six) hours as needed.    Marland Kitchen b complex-C-folic acid 1 MG capsule Take 1 capsule by mouth daily.    . Cholecalciferol ([employer] VIT D 5000 HIGH-POTENCY PO) Take 5,000 Units by mouth.    . fluticasone (FLONASE) 50 MCG/ACT nasal spray Place 2 sprays into both nostrils daily. 16 g 6  . loratadine (CLARITIN) 10 MG tablet Take 10 mg by mouth daily.    Marland Kitchen OVER THE COUNTER MEDICATION Potassium    . SYNTHROID 150 MCG tablet TAKE 1 TABLET BY MOUTH EVERY DAY BEFORE BREAKFAST 30 tablet 6  . triamterene-hydrochlorothiazide (MAXZIDE) 75-50 MG tablet Take 1 tablet by mouth daily. 30 tablet 6  . triamterene-hydrochlorothiazide (MAXZIDE) 75-50 MG tablet Take 1 tablet by mouth daily. 30 tablet 8  . triamterene-hydrochlorothiazide (MAXZIDE) 75-50 MG tablet TAKE 1 TABLET BY MOUTH DAILY 30 tablet 0  . triamterene-hydrochlorothiazide (MAXZIDE) 75-50 MG tablet TAKE 1 TABLET BY MOUTH DAILY 30 tablet 3  . albuterol (PROAIR HFA) 108 (90 Base) MCG/ACT inhaler Inhale 2 inhaltions TID prn (Patient not taking: Reported on 06/01/2016) 8.5 g 0  . Fluticasone-Salmeterol (ADVAIR DISKUS) 100-50 MCG/DOSE AEPB Inhale 1 puff into the lungs 2 (two) times daily. One inhalation bid until pollen gone then once daily (Patient not taking: Reported on 06/01/2016) 60 each 6  . Zoster Vaccine Live, PF, (ZOSTAVAX) 16109  UNT/0.65ML injection Inject 19,400 Units into the skin once. (Patient not taking: Reported on 06/01/2016) 1 each 0  . cyclobenzaprine (FLEXERIL) 10 MG tablet Take 1 tablet (10 mg total) by mouth at bedtime as needed for muscle spasms. (Patient not taking: Reported on 06/01/2016) 30 tablet 2   No facility-administered medications prior to visit.    No orders of the defined types were placed in this encounter.        Objective:   Physical Exam  Vitals:  Vitals:   06/01/16 1335  BP: 124/80  Pulse: 99  SpO2: 96%  Weight: 210 lb 12.8 oz (95.6 kg)  Height: 5\' 3"  (1.6 m)    Constitutional/General:  Pleasant, well-nourished, well-developed, not in any distress,  Comfortably seating.  Well kempt  Body mass index is 37.34 kg/m. Wt Readings from Last 3 Encounters:  06/01/16 210 lb 12.8 oz (95.6 kg)  01/18/16 216 lb (98 kg)  10/06/15 222 lb 4 oz (100.8 kg)     HEENT: Pupils equal and reactive to light and accommodation. Anicteric sclerae. Normal nasal mucosa.   No oral  lesions,  mouth clear,  oropharynx clear, no postnasal drip. (-) Oral thrush. No dental caries.  Airway - Mallampati class III-IV  Neck: No masses. Midline trachea. No JVD, (-) LAD. (-) bruits appreciated.  Respiratory/Chest: Grossly normal chest. (-) deformity. (-) Accessory muscle use.  Symmetric expansion. (-) Tenderness on palpation.  Resonant on percussion.  Diminished BS on both lower lung zones. (-) wheezing, crackles, rhonchi (-) egophony  Cardiovascular: Regular rate and  rhythm, heart sounds normal, no murmur or gallops, no peripheral edema  Gastrointestinal:  Normal bowel sounds. Soft, non-tender. No hepatosplenomegaly.  (-) masses.   Musculoskeletal:  Normal muscle tone. Normal gait.   Extremities: Grossly normal. (-) clubbing, cyanosis.  (-) edema  Skin: (-) rash,lesions seen.   Neurological/Psychiatric : alert, oriented to time, place, person. Normal mood and affect            Assessment & Plan:  OSA (obstructive sleep apnea) Pt was diagnosed with mild OSA based on lab study in 09/2014. AHI was 7.  She had a cpap titration study in 01/2015.  I could not see official reports for both.  I saw raw data in epic.  Based on cpap study, she was adequate on cpap 6 cm water. She was seeing Dr. West Bali then He felt pt needed BiPaP. Pt got BiPaP and felt better but she had to turn in her BiPaP as insurance did NOT cover it. She tried to get a cpap but was not able to.  Her sx are persistent. Pt has hypersomnia, snoring, gasping, choking.  Hypersomnia affects her fxnality.  Plan :  We discussed about the diagnosis of Obstructive Sleep Apnea (OSA) and implications of untreated OSA. We discussed about CPAP and BiPaP as possible treatment options.    Since there was a "break" in therapy, she will need a rpt sleep study. Plan for a HST.  If HST is (-), plan for a lab test.  If mild, will consider NOT treating > will need to discuss with pt again.    Patient was instructed to call the office if he/she has not heard back from the office 1-2 weeks after the sleep study.   Patient was instructed to call the office if he/she is having issues with the PAP device.   We discussed good sleep hygiene.   Patient was advised not to engage in activities requiring concentration and/or vigilance if he/she is sleepy.  Patient was advised not to drive if he/she is sleepy.    Asthma Non smoker. With "asthmatic bronchitis". Better with prn meds.   Obesity Weight reduction     Thank you very much for letting me participate in this patient's care. Please do not hesitate to give me a call if you have any questions or concerns regarding the treatment plan.   Patient will follow up with me in 6-8 weeks.      Monica Becton, MD 06/02/2016   5:10 AM Pulmonary and [place] Pager: [phone] Office: [phone], Fax: [phone]

## 2016-06-01 NOTE — Patient Instructions (Addendum)
It was a pleasure taking care of you today!  We will schedule you to have a sleep study to determine if you have sleep apnea.   We will get a home sleep test.  You will be instructed to come back to the office to get an apparatus to sleep with overnight.  Once we have the apparatus, it will usually take us 1-2 weeks to read the study and get back at you with results of the test.  Please give us a call in 2 weeks after your study if you do not hear back from us.    If the sleep study is positive, we will order you a CPAP  machine.  Please call the office if you do NOT receive your machine in the next 1-2 weeks.   Please make sure you use your CPAP device everytime you sleep.  We will monitor the usage of your machine per your insurance requirement.  Your insurance company may take the machine from you if you are not using it regularly.   Please clean the mask, tubings, filter, water reservoir with soapy water every week.  Please use distilled water for the water reservoir.   Please call the office or your machine provider (DME company) if you are having issues with the device.   Return to clinic in 6-8 weeks with Dr. De Dios or NP    

## 2016-06-02 DIAGNOSIS — E669 Obesity, unspecified: Secondary | ICD-10-CM | POA: Insufficient documentation

## 2016-06-02 NOTE — Assessment & Plan Note (Signed)
Non smoker. With "asthmatic bronchitis". Better with prn meds.

## 2016-06-02 NOTE — Assessment & Plan Note (Signed)
Pt was diagnosed with mild OSA based on lab study in 09/2014. AHI was 7.  She had a cpap titration study in 01/2015.  I could not see official reports for both.  I saw raw data in epic.  Based on cpap study, she was adequate on cpap 6 cm water. She was seeing Dr. West Bali then He felt pt needed BiPaP. Pt got BiPaP and felt better but she had to turn in her BiPaP as insurance did NOT cover it. She tried to get a cpap but was not able to.  Her sx are persistent. Pt has hypersomnia, snoring, gasping, choking.  Hypersomnia affects her fxnality.  Plan :  We discussed about the diagnosis of Obstructive Sleep Apnea (OSA) and implications of untreated OSA. We discussed about CPAP and BiPaP as possible treatment options.    Since there was a "break" in therapy, she will need a rpt sleep study. Plan for a HST.  If HST is (-), plan for a lab test.  If mild, will consider NOT treating > will need to discuss with pt again.    Patient was instructed to call the office if he/she has not heard back from the office 1-2 weeks after the sleep study.   Patient was instructed to call the office if he/she is having issues with the PAP device.   We discussed good sleep hygiene.   Patient was advised not to engage in activities requiring concentration and/or vigilance if he/she is sleepy.  Patient was advised not to drive if he/she is sleepy.

## 2016-06-02 NOTE — Assessment & Plan Note (Signed)
Weight reduction 

## 2016-06-13 DIAGNOSIS — H2512 Age-related nuclear cataract, left eye: Secondary | ICD-10-CM | POA: Diagnosis not present

## 2016-06-13 DIAGNOSIS — H25812 Combined forms of age-related cataract, left eye: Secondary | ICD-10-CM | POA: Diagnosis not present

## 2016-06-14 ENCOUNTER — Telehealth: Payer: Self-pay | Admitting: Pulmonary Disease

## 2016-06-14 DIAGNOSIS — H2511 Age-related nuclear cataract, right eye: Secondary | ICD-10-CM | POA: Diagnosis not present

## 2016-06-14 NOTE — Telephone Encounter (Signed)
Called and spoke with pt and she stated that she just had cataract surgery an is scheduled to have her left eye done on the 12th of December.  Pt stated that she would like to wait until sometime after the first of the year to have the sleep test done.  Will forward to AD to make him aware.

## 2016-06-27 DIAGNOSIS — H2511 Age-related nuclear cataract, right eye: Secondary | ICD-10-CM | POA: Diagnosis not present

## 2016-07-20 NOTE — Progress Notes (Signed)
Subjective:   Monica Wallace is a 66 y.o. female who presents for an Initial Medicare Annual Wellness Visit.  She is overall doing well. 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 She was not originally planning to retire until age 2. She left under 'unhappy circumstances.' She has some depressed mood and anger towards this situation. She would like to find work as a Marine scientist again.  Review of Systems    No ROS.  Medicare Wellness Visit.  Cardiac Risk Factors include: dyslipidemia;obesity (BMI >30kg/m2)  Sleep patterns: feels rested on waking, gets up 2 times nightly to void and 7 hours nightly. Uses lavender and vivexor essential oil in diffuser to help w/ sleep. Home Safety/Smoke Alarms: Feels safe in home. Smoke alarms in place.    Living environment; residence and Firearm Safety: Lives w/ sister, brother, and mother and 4 dogs. 2-story house, can live on one level, no firearms. Seat Belt Safety/Bike Helmet: Wears seat belt.   Counseling:   Eye Exam- Cataract excision in December w/ Dr. Tommy Rainwater. Wears reading glasses. Follows w/ Dr. Aundra Dubin yearly. Dental- does not see dentist regularly  Female:   Pap- Hysterectomy     Mammo- last 06/05/15. BI-RADS CATEGORY  1: Negative.      Dexa scan- last 03/20/15. Normal.         CCS- Not on file. Reports last done about a year ago w/ Dr. Collene Mares.     Objective:    Today's Vitals   07/21/16 1553  BP: 130/80  Pulse: 83  Resp: 16  Temp: 98.2 F (36.8 C)  TempSrc: Oral  SpO2: 96%  Weight: 211 lb 3.2 oz (95.8 kg)  Height: 5' 2.5" (1.588 m)   Body mass index is 38.01 kg/m.   Current Medications (verified) Outpatient Encounter Prescriptions as of 07/21/2016  Medication Sig  . acetaminophen (TYLENOL) 325 MG tablet Take 650 mg by mouth every 6 (six) hours as needed.  Marland Kitchen b complex-C-folic acid 1 MG capsule Take 1 capsule by mouth daily.  . Cholecalciferol (CVS VIT D 5000 HIGH-POTENCY PO) Take 5,000 Units by mouth.  .  fluticasone (FLONASE) 50 MCG/ACT nasal spray Place 2 sprays into both nostrils daily.  Marland Kitchen loratadine (CLARITIN) 10 MG tablet Take 10 mg by mouth daily.  Marland Kitchen OVER THE COUNTER MEDICATION Potassium  . SYNTHROID 150 MCG tablet TAKE 1 TABLET BY MOUTH EVERY DAY BEFORE BREAKFAST  . triamterene-hydrochlorothiazide (MAXZIDE) 75-50 MG tablet Take 1 tablet by mouth daily.  . [DISCONTINUED] fluticasone (FLONASE) 50 MCG/ACT nasal spray Place 2 sprays into both nostrils daily.  . [DISCONTINUED] SYNTHROID 150 MCG tablet TAKE 1 TABLET BY MOUTH EVERY DAY BEFORE BREAKFAST  . [DISCONTINUED] triamterene-hydrochlorothiazide (MAXZIDE) 75-50 MG tablet Take 1 tablet by mouth daily.  Marland Kitchen albuterol (PROAIR HFA) 108 (90 Base) MCG/ACT inhaler Inhale 2 inhaltions TID prn (Patient not taking: Reported on 07/21/2016)  . [DISCONTINUED] Fluticasone-Salmeterol (ADVAIR DISKUS) 100-50 MCG/DOSE AEPB Inhale 1 puff into the lungs 2 (two) times daily. One inhalation bid until pollen gone then once daily (Patient not taking: Reported on 07/21/2016)  . [DISCONTINUED] Zoster Vaccine Live, PF, (ZOSTAVAX) 16109 UNT/0.65ML injection Inject 19,400 Units into the skin once. (Patient not taking: Reported on 06/01/2016)   No facility-administered encounter medications on file as of 07/21/2016.     Allergies (verified) Levaquin [levofloxacin]; Penicillins; and Codeine   History: Past Medical History:  Diagnosis Date  . Arthritis   . Cancer ([place])    thyroid (2002)  . Cystic  breast   . GERD (gastroesophageal reflux disease)   . Hepatic cyst 02/02/2015  . Hyperlipidemia   . Hypertension   . Menopause   . Migraine   . Palpitations   . Preventative health care 01/18/2016  . Sleep apnea 02/02/2015  . Thyroid disease    Past Surgical History:  Procedure Laterality Date  . ABDOMINAL HYSTERECTOMY     took right ovary and uterus  . APPENDECTOMY    . CARPAL TUNNEL RELEASE Right   . CESAREAN SECTION    . CHOLECYSTECTOMY    . EYE SURGERY Bilateral  2017   cataracts  . KNEE ARTHROPLASTY     Family History  Problem Relation Age of Onset  . Arthritis Mother   . Transient ischemic attack Mother   . Hypertension Mother   . Hyperlipidemia Mother   . Diabetes Father   . Heart disease Father   . Arthritis Father   . Kidney disease Father   . Hypertension Father   . Hyperlipidemia Father   . Cancer Sister 47    pancreatic  . Fibromyalgia Sister   . Asthma Maternal Grandfather     smoker  . Depression Maternal Grandfather   . Dementia Paternal Grandmother   . Diabetes Paternal Grandfather   . Hypertension Brother   . Scoliosis Daughter     had rods placed and removed  . Arthritis Sister   . Gout Sister   . GER disease Sister   . Cancer Sister     breast cancer   Social History   Occupational History  . Not on file.   Social History Main Topics  . Smoking status: Never Smoker  . Smokeless tobacco: Never Used  . Alcohol use No  . Drug use: No  . Sexual activity: No     Comment: Hysterectomy, lives with mother    Tobacco Counseling Counseling given: Not Answered   Activities of Daily Living In your present state of health, do you have any difficulty performing the following activities: 07/21/2016 01/18/2016  Hearing? N N  Vision? N Y  Difficulty concentrating or making decisions? N N  Walking or climbing stairs? N Y  Dressing or bathing? N N  Doing errands, shopping? N N  Preparing Food and eating ? N -  Using the Toilet? N -  In the past six months, have you accidently leaked urine? N -  Do you have problems with loss of bowel control? N -  Managing your Medications? N -  Managing your Finances? N -  Housekeeping or managing your Housekeeping? N -  Some recent data might be hidden    Immunizations and Health Maintenance Immunization History  Administered Date(s) Administered  . Influenza-Unspecified 03/19/2015, 05/09/2016  . Pneumococcal Conjugate-13 01/18/2016  . Tdap 07/01/2014   Health Maintenance  Due  Topic Date Due  . COLON CANCER SCREENING ANNUAL FOBT  07/02/2015    Patient Care Team: [name], MD as PCP - General (Family Medicine) Philemon Kingdom, MD as Consulting Physician (Internal Medicine) Juanita Craver, MD as Consulting Physician (Gastroenterology) Rush Landmark, MD as Consulting Physician (Pulmonary Disease) Juanita Craver, MD as Consulting Physician (Gastroenterology)  Indicate any recent Medical Services you may have received from other than [facility] providers in the past year (date may be approximate).     Assessment:   This is a routine wellness examination for Calee. Physical assessment deferred to PCP.  Hearing/Vision screen Hearing Screening Comments: Able to hear conversational tones w/o difficulty.  No issues reported.  Vision Screening Comments: Cataract excision in December. Follows w/ eye doctor yearly and PRN.  Dietary issues and exercise activities discussed: Current Exercise Habits: Home exercise routine, Type of exercise: stretching  Diet (meal preparation, eat out, water intake, caffeinated beverages, dairy products, fruits and vegetables): in general, a "healthy" diet  , well balanced, on average, 2 meals per day, on average, 2-3 servings fruit per day. Eats lots of vegetables. Drinks water, but 'probably not enough.' Trying to increase water intake. Drinks homemade tea.  Breakfast: varies-yogurt and granola w/ fruit OR toast, egg, and bacon Lunch/Dinner: varies      Goals    . Increase physical activity    . Increase water intake    . Weight (lb) < 200 lb (90.7 kg)      Depression Screen PHQ 2/9 Scores 07/21/2016 01/18/2016 09/09/2014  PHQ - 2 Score 1 0 0    Fall Risk Fall Risk  07/21/2016 01/18/2016 09/09/2014  Falls in the past year? No No No    Cognitive Function: MMSE - Mini Mental State Exam 07/21/2016  Orientation to time 5  Orientation to Place 5  Registration 3  Attention/ Calculation 5  Recall 3  Language- name 2 objects 2    Language- repeat 1  Language- follow 3 step command 3  Language- read & follow direction 1  Write a sentence 1  Copy design 1  Total score 30        Screening Tests Health Maintenance  Topic Date Due  . COLON CANCER SCREENING ANNUAL FOBT  07/02/2015  . PNA vac Low Risk Adult (2 of 2 - PPSV23) 01/17/2017  . MAMMOGRAM  06/04/2017  . TETANUS/TDAP  07/01/2024  . INFLUENZA VACCINE  Addressed  . DEXA SCAN  Completed  . ZOSTAVAX  Addressed  . Hepatitis C Screening  Completed      Plan:    Follow-up w/ PCP as directed. Records release signed for Dr. Mar Daring Mann/colonoscopy report. Dental resources list provided.   During the course of the visit, Oney was educated and counseled about the following appropriate screening and preventive services:   Vaccines to include Pneumoccal, Influenza, Hepatitis B, Td, Zostavax, HC  Cardiovascular disease screening  Colorectal cancer screening  Bone density screening  Diabetes screening  Glaucoma screening  Mammography/PAP  Nutrition counseling   Patient Instructions (the written plan) were given to the patient.    Dorrene German, RN   07/22/2016

## 2016-07-20 NOTE — Progress Notes (Signed)
Pre visit review using our clinic review tool, if applicable. No additional management support is needed unless otherwise documented below in the visit note. 

## 2016-07-21 ENCOUNTER — Telehealth: Payer: Self-pay | Admitting: *Deleted

## 2016-07-21 ENCOUNTER — Ambulatory Visit (INDEPENDENT_AMBULATORY_CARE_PROVIDER_SITE_OTHER): Payer: Medicare Other | Admitting: Family Medicine

## 2016-07-21 ENCOUNTER — Encounter: Payer: Self-pay | Admitting: Family Medicine

## 2016-07-21 VITALS — BP 130/80 | HR 83 | Temp 98.2°F | Resp 16 | Ht 62.5 in | Wt 211.2 lb

## 2016-07-21 DIAGNOSIS — G4700 Insomnia, unspecified: Secondary | ICD-10-CM

## 2016-07-21 DIAGNOSIS — I10 Essential (primary) hypertension: Secondary | ICD-10-CM

## 2016-07-21 DIAGNOSIS — Z0000 Encounter for general adult medical examination without abnormal findings: Secondary | ICD-10-CM | POA: Diagnosis not present

## 2016-07-21 DIAGNOSIS — E890 Postprocedural hypothyroidism: Secondary | ICD-10-CM

## 2016-07-21 DIAGNOSIS — E782 Mixed hyperlipidemia: Secondary | ICD-10-CM

## 2016-07-21 DIAGNOSIS — R739 Hyperglycemia, unspecified: Secondary | ICD-10-CM

## 2016-07-21 DIAGNOSIS — C73 Malignant neoplasm of thyroid gland: Secondary | ICD-10-CM

## 2016-07-21 DIAGNOSIS — E669 Obesity, unspecified: Secondary | ICD-10-CM

## 2016-07-21 DIAGNOSIS — R002 Palpitations: Secondary | ICD-10-CM | POA: Diagnosis not present

## 2016-07-21 DIAGNOSIS — G4733 Obstructive sleep apnea (adult) (pediatric): Secondary | ICD-10-CM

## 2016-07-21 DIAGNOSIS — E876 Hypokalemia: Secondary | ICD-10-CM

## 2016-07-21 DIAGNOSIS — J309 Allergic rhinitis, unspecified: Secondary | ICD-10-CM

## 2016-07-21 MED ORDER — FLUTICASONE PROPIONATE 50 MCG/ACT NA SUSP: 2.0000 | Freq: Every day | NASAL | 6 refills | Status: DC

## 2016-07-21 MED ORDER — TRIAMTERENE-HCTZ 75-50 MG PO TABS: 1.0000 | ORAL_TABLET | Freq: Every day | ORAL | 6 refills | Status: DC

## 2016-07-21 MED ORDER — SYNTHROID 150 MCG PO TABS: ORAL_TABLET | ORAL | 6 refills | Status: DC

## 2016-07-21 NOTE — Patient Instructions (Addendum)
[email or web address]  L Tryptophan or Melatonin for insomnia Warm milk and cookies   Advance Directive Advance directives are the legal documents that allow you to make choices about your health care and medical treatment if you cannot speak for yourself. Advance directives are a way for you to communicate your wishes to family, friends, and health care providers. The specified people can then convey your decisions about end-of-life care to avoid confusion if you should become unable to communicate. Ideally, the process of discussing and writing advance directives should happen over time rather than making decisions all at once. Advance directives can be modified as your situation changes, and you can change your mind at any time, even after you have signed the advance directives. Each state has its own laws regarding advance directives. You may want to check with your health care provider, attorney, or state representative about the law in your state. Below are some examples of advance directives. HEALTH CARE PROXY AND DURABLE POWER OF ATTORNEY FOR HEALTH CARE A health care proxy is a person (agent) appointed to make medical decisions for you if you cannot. Generally, people choose someone they know well and trust to represent their preferences when they can no longer do so. You should be sure to ask this person for agreement to act as your agent. An agent may have to exercise judgment in the event of a medical decision for which your wishes are not known. A durable power of attorney for health care is a legal document that names your health care proxy. Depending on the laws in your state, after the document is written, it may also need to be:  Signed.  Notarized.  Dated.  Copied.  Witnessed.  Incorporated into your medical record. You may also want to appoint someone to manage your financial affairs if you cannot. This is called a durable power of attorney for finances. It is a  separate legal document from the durable power of attorney for health care. You may choose the same person or someone different from your health care proxy to act as your agent in financial matters. LIVING WILL A living will is a set of instructions documenting your wishes about medical care when you cannot care for yourself. It is used if you become:  Terminally ill.  Incapacitated.  Unable to communicate.  Unable to make decisions. Items to consider in your living will include:  The use or non-use of life-sustaining equipment, such as dialysis machines and breathing machines (ventilators).  A do not resuscitate (DNR) order, which is the instruction not to use cardiopulmonary resuscitation (CPR) if breathing or heartbeat stops.  Tube feeding.  Withholding of food and fluids.  Comfort (palliative) care when the goal becomes comfort rather than a cure.  Organ and tissue donation. A living will does not give instructions about distribution of your money and property if you should pass away. It is advisable to seek the expert advice of a lawyer in drawing up a will regarding your possessions. Decisions about taxes, beneficiaries, and asset distribution will be legally binding. This process can relieve your family and friends of any burdens surrounding disputes or questions that may come up about the allocation of your assets. DO NOT RESUSCITATE (DNR) A do not resuscitate (DNR) order is a request to not have CPR in the event that your heart stops beating or you stop breathing. Unless given other instructions, a health care provider will try to help any patient whose heart has stopped or who  has stopped breathing.  This information is not intended to replace advice given to you by your health care provider. Make sure you discuss any questions you have with your health care provider. Document Released: 10/11/2007 Document Revised: 10/26/2015 Document Reviewed: 11/21/2012 Elsevier Interactive  Patient Education  2017 [employer].

## 2016-07-21 NOTE — Progress Notes (Signed)
Subjective:    Patient ID: Monica Wallace, female    DOB: [birth year], 66 y.o.   MRN: [id number]  Chief Complaint  Patient presents with  . Follow-up  . Medicare Wellness    HPI Patient is in today for follow up patient c/o lower back pain. She denies any falls or trauma. No radicular symptoms or incontinence. She is also struggling with hi levels of stress and insomnia. She was forced into retirement in August and has not worked since then. She denies any recent acute illness or hospitalization.s he is having trouble maintaining sleep. Denies CP/palp/SOB/HA/congestion/fevers/GI or GU c/o. Taking meds as prescribed  Past Medical History:  Diagnosis Date  . Arthritis   . Cancer ([place])    thyroid (2002)  . Cystic breast   . GERD (gastroesophageal reflux disease)   . Hepatic cyst 02/02/2015  . Hyperlipidemia   . Hypertension   . Insomnia 07/23/2016  . Menopause   . Migraine   . Palpitations   . Preventative health care 01/18/2016  . Sleep apnea 02/02/2015  . Thyroid disease     Past Surgical History:  Procedure Laterality Date  . ABDOMINAL HYSTERECTOMY     took right ovary and uterus  . APPENDECTOMY    . CARPAL TUNNEL RELEASE Right   . CESAREAN SECTION    . CHOLECYSTECTOMY    . EYE SURGERY Bilateral 2017   cataracts  . KNEE ARTHROPLASTY      Family History  Problem Relation Age of Onset  . Arthritis Mother   . Transient ischemic attack Mother   . Hypertension Mother   . Hyperlipidemia Mother   . Diabetes Father   . Heart disease Father   . Arthritis Father   . Kidney disease Father   . Hypertension Father   . Hyperlipidemia Father   . Cancer Sister 7    pancreatic  . Fibromyalgia Sister   . Asthma Maternal Grandfather     smoker  . Depression Maternal Grandfather   . Dementia Paternal Grandmother   . Diabetes Paternal Grandfather   . Hypertension Brother   . Scoliosis Daughter     had rods placed and removed  . Arthritis Sister   . Gout Sister   . GER  disease Sister   . Cancer Sister     breast cancer    Social History   Social History  . Marital status: Divorced    Spouse name: N/A  . Number of children: N/A  . Years of education: N/A   Occupational History  . Not on file.   Social History Main Topics  . Smoking status: Never Smoker  . Smokeless tobacco: Never Used  . Alcohol use No  . Drug use: No  . Sexual activity: No     Comment: Hysterectomy, lives with mother   Other Topics Concern  . Not on file   Social History Narrative  . No narrative on file    Outpatient Medications Prior to Visit  Medication Sig Dispense Refill  . acetaminophen (TYLENOL) 325 MG tablet Take 650 mg by mouth every 6 (six) hours as needed.    Marland Kitchen b complex-C-folic acid 1 MG capsule Take 1 capsule by mouth daily.    . Cholecalciferol ([employer] VIT D 5000 HIGH-POTENCY PO) Take 5,000 Units by mouth.    . loratadine (CLARITIN) 10 MG tablet Take 10 mg by mouth daily.    Marland Kitchen OVER THE COUNTER MEDICATION Potassium    . fluticasone (FLONASE) 50 MCG/ACT nasal spray  Place 2 sprays into both nostrils daily. 16 g 6  . SYNTHROID 150 MCG tablet TAKE 1 TABLET BY MOUTH EVERY DAY BEFORE BREAKFAST 30 tablet 6  . triamterene-hydrochlorothiazide (MAXZIDE) 75-50 MG tablet Take 1 tablet by mouth daily. 30 tablet 6  . albuterol (PROAIR HFA) 108 (90 Base) MCG/ACT inhaler Inhale 2 inhaltions TID prn (Patient not taking: Reported on 07/21/2016) 8.5 g 0  . Fluticasone-Salmeterol (ADVAIR DISKUS) 100-50 MCG/DOSE AEPB Inhale 1 puff into the lungs 2 (two) times daily. One inhalation bid until pollen gone then once daily (Patient not taking: Reported on 07/21/2016) 60 each 6  . Zoster Vaccine Live, PF, (ZOSTAVAX) 16109 UNT/0.65ML injection Inject 19,400 Units into the skin once. (Patient not taking: Reported on 06/01/2016) 1 each 0   No facility-administered medications prior to visit.     Allergies  Allergen Reactions  . Levaquin [Levofloxacin] Shortness Of Breath  . Penicillins       Positive on allergy testing 'years ago.' Has never had penicillin. Has tolerated amoxicillin.  . Codeine Itching    Review of Systems  Constitutional: Negative for fever.  Eyes: Negative for blurred vision.  Respiratory: Negative for cough and shortness of breath.   Cardiovascular: Negative for chest pain and palpitations.  Gastrointestinal: Negative for vomiting.  Musculoskeletal: Negative for back pain.  Skin: Negative for rash.  Neurological: Negative for loss of consciousness and headaches.       Objective:    Physical Exam  Constitutional: She is oriented to person, place, and time. She appears well-developed and well-nourished. No distress.  HENT:  Head: Normocephalic and atraumatic.  Eyes: Conjunctivae are normal.  Neck: Normal range of motion. No thyromegaly present.  Cardiovascular: Normal rate and regular rhythm.   Pulmonary/Chest: Effort normal and breath sounds normal. She has no wheezes.  Abdominal: Soft. Bowel sounds are normal. There is no tenderness.  Musculoskeletal: Normal range of motion. She exhibits no edema or deformity.  Neurological: She is alert and oriented to person, place, and time.  Skin: Skin is warm and dry. She is not diaphoretic.  Psychiatric: She has a normal mood and affect.    BP 130/80 (BP Location: Left Arm, Patient Position: Sitting, Cuff Size: Normal)   Pulse 83   Temp 98.2 F (36.8 C) (Oral)   Resp 16   Ht 5' 2.5" (1.588 m)   Wt 211 lb 3.2 oz (95.8 kg)   SpO2 96%   BMI 38.01 kg/m  Wt Readings from Last 3 Encounters:  07/21/16 211 lb 3.2 oz (95.8 kg)  06/01/16 210 lb 12.8 oz (95.6 kg)  01/18/16 216 lb (98 kg)     Lab Results  Component Value Date   WBC 10.9 (H) 01/18/2016   HGB 13.0 01/18/2016   HCT 39.2 01/18/2016   PLT 325.0 01/18/2016   GLUCOSE 87 01/18/2016   CHOL 196 01/18/2016   TRIG 115.0 01/18/2016   HDL 52.30 01/18/2016   LDLCALC 121 (H) 01/18/2016   ALT 28 01/18/2016   AST 31 01/18/2016   NA 135  01/18/2016   K 3.4 (L) 01/18/2016   CL 101 01/18/2016   CREATININE 0.90 01/18/2016   BUN 14 01/18/2016   CO2 30 01/18/2016   TSH 2.57 01/18/2016   HGBA1C 6.2 01/18/2016    Lab Results  Component Value Date   TSH 2.57 01/18/2016   Lab Results  Component Value Date   WBC 10.9 (H) 01/18/2016   HGB 13.0 01/18/2016   HCT 39.2 01/18/2016   MCV 89.7  01/18/2016   PLT 325.0 01/18/2016   Lab Results  Component Value Date   NA 135 01/18/2016   K 3.4 (L) 01/18/2016   CO2 30 01/18/2016   GLUCOSE 87 01/18/2016   BUN 14 01/18/2016   CREATININE 0.90 01/18/2016   BILITOT 0.3 01/18/2016   ALKPHOS 92 01/18/2016   AST 31 01/18/2016   ALT 28 01/18/2016   PROT 7.3 01/18/2016   ALBUMIN 4.0 01/18/2016   CALCIUM 9.5 01/18/2016   ANIONGAP 6 09/15/2014   GFR 80.34 01/18/2016   Lab Results  Component Value Date   CHOL 196 01/18/2016   Lab Results  Component Value Date   HDL 52.30 01/18/2016   Lab Results  Component Value Date   LDLCALC 121 (H) 01/18/2016   Lab Results  Component Value Date   TRIG 115.0 01/18/2016   Lab Results  Component Value Date   CHOLHDL 4 01/18/2016   Lab Results  Component Value Date   HGBA1C 6.2 01/18/2016      I acted as a Education administrator for Dr. Charlett Blake. Princess, RMA  Assessment & Plan:   Problem List Items Addressed This Visit    Hyperlipidemia    Encouraged heart healthy diet, increase exercise, avoid trans fats, consider a krill oil cap daily      Relevant Medications   triamterene-hydrochlorothiazide (MAXZIDE) 75-50 MG tablet   Other Relevant Orders   Lipid panel   Thyroid cancer ([place]) - Primary   Relevant Medications   SYNTHROID 150 MCG tablet   HTN (hypertension)    Well controlled, no changes to meds. Encouraged heart healthy diet such as the DASH diet and exercise as tolerated.       Relevant Medications   triamterene-hydrochlorothiazide (MAXZIDE) 75-50 MG tablet   Other Relevant Orders   CBC   Comprehensive metabolic panel   TSH     Hypothyroidism    On Levothyroxine, continue to monitor      Relevant Medications   SYNTHROID 150 MCG tablet   Allergic rhinitis    Has struggled with some congestion but feels she is doing better now. She believes she had a URI but used [place] seismologist essential oils and she feels that has helped no acute concerns today      Palpitations   OSA (obstructive sleep apnea)   Obesity (BMI 30-39.9)    Encouraged DASH diet, decrease po intake and increase exercise as tolerated. Needs 7-8 hours of sleep nightly. Avoid trans fats, eat small, frequent meals every 4-5 hours with lean proteins, complex carbs and healthy fats. Minimize simple carbs, consider referral referral to bariatrics service. She will let us know if she would like to proceed      Hyperglycemia    hgba1c acceptable, minimize simple carbs. Increase exercise as tolerated. Continue current meds      Relevant Orders   Hemoglobin A1c   Insomnia    Encouraged good sleep hygiene such as dark, quiet room. No blue/green glowing lights such as computer screens in bedroom. No alcohol or stimulants in evening. Cut down on caffeine as able. Regular exercise is helpful but not just prior to bed time.       Hypokalemia    Mild and asymptomatic, encouraged to increase potassium in diet and advised regarding symptoms resulting from worsening.        Other Visit Diagnoses    Encounter for Medicare annual wellness exam          I have discontinued Ms. Taves's Fluticasone-Salmeterol and  Zoster Vaccine Live (PF). I am also having her maintain her b complex-C-folic acid, loratadine, Cholecalciferol ([facility]S VIT D 5000 HIGH-POTENCY PO), OVER THE COUNTER MEDICATION, acetaminophen, albuterol, SYNTHROID, fluticasone, and triamterene-hydrochlorothiazide.  Meds ordered this encounter  Medications  . SYNTHROID 150 MCG tablet    Sig: TAKE 1 TABLET BY MOUTH EVERY DAY BEFORE BREAKFAST    Dispense:  30 tablet    Refill:  6  . fluticasone  (FLONASE) 50 MCG/ACT nasal spray    Sig: Place 2 sprays into both nostrils daily.    Dispense:  16 g    Refill:  6  . triamterene-hydrochlorothiazide (MAXZIDE) 75-50 MG tablet    Sig: Take 1 tablet by mouth daily.    Dispense:  30 tablet    Refill:  6    CMA served as scribe during this visit. History, Physical and Plan performed by medical provider. Documentation and orders reviewed and attested to.  [name], MD

## 2016-07-21 NOTE — Progress Notes (Signed)
Pre visit review using our clinic review tool, if applicable. No additional management support is needed unless otherwise documented below in the visit note. 

## 2016-07-21 NOTE — Telephone Encounter (Signed)
Called patient and left message to return call

## 2016-07-22 ENCOUNTER — Encounter: Payer: Self-pay | Admitting: Family Medicine

## 2016-07-23 ENCOUNTER — Encounter: Payer: Self-pay | Admitting: Family Medicine

## 2016-07-23 DIAGNOSIS — G4700 Insomnia, unspecified: Secondary | ICD-10-CM | POA: Insufficient documentation

## 2016-07-23 DIAGNOSIS — E876 Hypokalemia: Secondary | ICD-10-CM | POA: Insufficient documentation

## 2016-07-23 HISTORY — DX: Insomnia, unspecified: G47.00

## 2016-07-23 NOTE — Assessment & Plan Note (Signed)
hgba1c acceptable, minimize simple carbs. Increase exercise as tolerated. Continue current meds 

## 2016-07-23 NOTE — Assessment & Plan Note (Signed)
On Levothyroxine, continue to monitor 

## 2016-07-23 NOTE — Assessment & Plan Note (Signed)
Encouraged heart healthy diet, increase exercise, avoid trans fats, consider a krill oil cap daily 

## 2016-07-23 NOTE — Assessment & Plan Note (Signed)
Encouraged good sleep hygiene such as dark, quiet room. No blue/green glowing lights such as computer screens in bedroom. No alcohol or stimulants in evening. Cut down on caffeine as able. Regular exercise is helpful but not just prior to bed time.  

## 2016-07-23 NOTE — Assessment & Plan Note (Signed)
Mild and asymptomatic, encouraged to increase potassium in diet and advised regarding symptoms resulting from worsening.

## 2016-07-23 NOTE — Assessment & Plan Note (Signed)
Well controlled, no changes to meds. Encouraged heart healthy diet such as the DASH diet and exercise as tolerated.  °

## 2016-07-23 NOTE — Assessment & Plan Note (Signed)
Encouraged DASH diet, decrease po intake and increase exercise as tolerated. Needs 7-8 hours of sleep nightly. Avoid trans fats, eat small, frequent meals every 4-5 hours with lean proteins, complex carbs and healthy fats. Minimize simple carbs, consider referral referral to bariatrics service. She will let us know if she would like to proceed

## 2016-07-23 NOTE — Assessment & Plan Note (Signed)
Has struggled with some congestion but feels she is doing better now. She believes she had a URI but used [place] seismologist essential oils and she feels that has helped no acute concerns today

## 2016-07-28 DIAGNOSIS — G4733 Obstructive sleep apnea (adult) (pediatric): Secondary | ICD-10-CM | POA: Diagnosis not present

## 2016-07-29 ENCOUNTER — Ambulatory Visit: Payer: Medicare Other | Admitting: Pulmonary Disease

## 2016-08-02 ENCOUNTER — Other Ambulatory Visit: Payer: Self-pay | Admitting: Family Medicine

## 2016-08-02 DIAGNOSIS — Z1231 Encounter for screening mammogram for malignant neoplasm of breast: Secondary | ICD-10-CM

## 2016-08-09 ENCOUNTER — Telehealth: Payer: Self-pay | Admitting: Pulmonary Disease

## 2016-08-09 NOTE — Telephone Encounter (Signed)
  Please call the pt and tell the pt the [place]  showed OSA   Pt stops breathing 5   times an hour.   Home sleep study was done on : 07/28/16  Please order autoCPAP 5-15 cm H2O. Patient will need a mask fitting session. Patient will need a 1 month download.   Patient needs to be seen by me or any of the NPs/APPs  4-6 weeks after obtaining the cpap machine. Let me know if you receive this.   Thanks!   J. Shirl Harris, MD 08/09/2016, 11:51 PM

## 2016-08-12 ENCOUNTER — Other Ambulatory Visit: Payer: Self-pay | Admitting: *Deleted

## 2016-08-12 DIAGNOSIS — G4733 Obstructive sleep apnea (adult) (pediatric): Secondary | ICD-10-CM | POA: Diagnosis not present

## 2016-08-12 NOTE — Telephone Encounter (Signed)
Spoke with pt. She preferred to come in and discuss further options an appointment has been made. Nothing further at this time. Is needed

## 2016-08-22 ENCOUNTER — Encounter: Payer: Self-pay | Admitting: Pulmonary Disease

## 2016-08-22 ENCOUNTER — Ambulatory Visit (INDEPENDENT_AMBULATORY_CARE_PROVIDER_SITE_OTHER): Payer: Medicare Other | Admitting: Pulmonary Disease

## 2016-08-22 DIAGNOSIS — G4733 Obstructive sleep apnea (adult) (pediatric): Secondary | ICD-10-CM

## 2016-08-22 DIAGNOSIS — J45909 Unspecified asthma, uncomplicated: Secondary | ICD-10-CM

## 2016-08-22 NOTE — Assessment & Plan Note (Signed)
Non smoker. With "asthmatic bronchitis". Better with prn meds.

## 2016-08-22 NOTE — Assessment & Plan Note (Signed)
Pt was diagnosed with mild OSA based on lab study in 09/2014. AHI was 7.  She had a cpap titration study in 01/2015.  I could not see official reports for both.  I saw raw data in epic.  Based on cpap study, she was adequate on cpap 6 cm water. She was seeing Dr. West Bali then He felt pt needed BiPaP. Pt got BiPaP and felt better but she had to turn in her BiPaP as insurance did NOT cover it. She tried to get a cpap but was not able to.  Her sx are persistent. Pt has hypersomnia, snoring, gasping, choking.  Hypersomnia affects her fxnality.  His last seen, she had a sleep study home in January. Her AHI was 5. She modified her sleep schedule. Her hypersomnia and sleepiness are better. Less fatigue. Feels better. Denies napping.  We discussed about treatment of sleep apnea. She wanted to hold off and I agreed with her. Hold off on CPAP therapy as she is not too symptomatic and she only has mild sleep apnea and she does not have a lot of comorbidities.  I told her to give [place] a call if she gets more symptomatic then we can try her on auto CPAP 5-15 cm water  if her home sleep study will still work.

## 2016-08-22 NOTE — Patient Instructions (Signed)
  It was a pleasure taking care of you today!   We will hold off on cpap as you are not too symptomatic    Return to clinic in as needed.

## 2016-08-22 NOTE — Progress Notes (Signed)
Subjective:    Patient ID: Monica Wallace, female    DOB: 22-Apr-1951, 66 y.o.   MRN: [id number]  HPI   This is the case of Monica Wallace, 66 y.o. Female, who was referred by Dr. [name]  in consultation regarding OSA.   Patient has snoring, witnessed apneas, gasping, choking. Sleeps 6-7 hrs per night. Feels refreshed waking up. Occasional sleepiness in pm.  Hypersomnia affects fxnality.   Patient ended up seeing Dr. West Bali. Patient had a diagnostic lab study in March 2016. Her AHI was 6.7. She had a CPAP titration study in July 2017 and raw data showed she was adequate on cpap 6 cm water. Unfortunately, I could not see the official reports on the diagnostic and therapeutic sleep studies. Per Dr. Evette Georges note, patient's sleep apnea was not adequately treated on CPAP, especially during REM sleep. Patient was started on BiPAP. She felt better using the BiPAP. Unfortunately, insurance did not approve the BiPAP and was told she needed a CPAP. DME asked Dr. Claiborne Billings to reconsider CPAP for the patient. Patient eventually was not able to follow-up. She turned in her BiPAP as it was not covered by insurance.  Her symptoms of persistent. She has hypersomnia affecting her functionality. She felt better when she was using the BiPAP.  Non smoker.  She has "asthmatic bronchitis", usually better with Abx.  Uses prn alb nebulizer and MDI.   Has GERD. Stable.   ROV 08/22/2016 Patient returns to the office as follow-up on her sleep apnea. Since last seen, she had a home sleep study and her AHI was 5. Her sleep is significantly improved. Feels refreshed when she wakes up. Denies hypersomnia. Fatigue is better. No issues since last seen. Remains healthy.  Review of Systems  Constitutional: Negative.  Negative for fever and unexpected weight change.  HENT: Positive for congestion, sneezing and sore throat. Negative for dental problem, ear pain, nosebleeds, postnasal drip, rhinorrhea, sinus pressure and  trouble swallowing.   Eyes: Positive for itching. Negative for redness.  Respiratory: Positive for cough. Negative for chest tightness, shortness of breath and wheezing.   Cardiovascular: Negative.  Negative for palpitations and leg swelling.  Gastrointestinal: Negative for nausea and vomiting.  Endocrine: Negative.   Genitourinary: Negative.  Negative for dysuria.  Musculoskeletal: Positive for joint swelling.  Skin: Negative.  Negative for rash.  Allergic/Immunologic: Positive for environmental allergies.  Neurological: Positive for dizziness. Negative for headaches.  Hematological: Does not bruise/bleed easily.  Psychiatric/Behavioral: Negative.  Negative for dysphoric mood. The patient is not nervous/anxious.       Objective:   Physical Exam  Vitals:  Vitals:   08/22/16 1445  BP: 130/70  Pulse: 72  SpO2: 98%  Weight: 212 lb 6.4 oz (96.3 kg)  Height: 5\' 2"  (1.575 m)    Constitutional/General:  Pleasant, well-nourished, well-developed, not in any distress,  Comfortably seating.  Well kempt  Body mass index is 38.85 kg/m. Wt Readings from Last 3 Encounters:  08/22/16 212 lb 6.4 oz (96.3 kg)  07/21/16 211 lb 3.2 oz (95.8 kg)  06/01/16 210 lb 12.8 oz (95.6 kg)     HEENT: Pupils equal and reactive to light and accommodation. Anicteric sclerae. Normal nasal mucosa.   No oral  lesions,  mouth clear,  oropharynx clear, no postnasal drip. (-) Oral thrush. No dental caries.  Airway - Mallampati class II  Neck: No masses. Midline trachea. No JVD, (-) LAD. (-) bruits appreciated.  Respiratory/Chest: Grossly normal chest. (-) deformity. (-) Accessory  muscle use.  Symmetric expansion. (-) Tenderness on palpation.  Resonant on percussion.  Diminished BS on both lower lung zones. (-) wheezing, crackles, rhonchi (-) egophony  Cardiovascular: Regular rate and  rhythm, heart sounds normal, no murmur or gallops, no peripheral edema  Gastrointestinal:  Normal bowel sounds.  Soft, non-tender. No hepatosplenomegaly.  (-) masses.   Musculoskeletal:  Normal muscle tone. Normal gait.   Extremities: Grossly normal. (-) clubbing, cyanosis.  (-) edema  Skin: (-) rash,lesions seen.   Neurological/Psychiatric : alert, oriented to time, place, person. Normal mood and affect          Assessment & Plan:  OSA (obstructive sleep apnea) Pt was diagnosed with mild OSA based on lab study in 09/2014. AHI was 7.  She had a cpap titration study in 01/2015.  I could not see official reports for both.  I saw raw data in epic.  Based on cpap study, she was adequate on cpap 6 cm water. She was seeing Dr. West Bali then He felt pt needed BiPaP. Pt got BiPaP and felt better but she had to turn in her BiPaP as insurance did NOT cover it. She tried to get a cpap but was not able to.  Her sx are persistent. Pt has hypersomnia, snoring, gasping, choking.  Hypersomnia affects her fxnality.  His last seen, she had a sleep study home in January. Her AHI was 5. She modified her sleep schedule. Her hypersomnia and sleepiness are better. Less fatigue. Feels better. Denies napping.  We discussed about treatment of sleep apnea. She wanted to hold off and I agreed with her. Hold off on CPAP therapy as she is not too symptomatic and she only has mild sleep apnea and she does not have a lot of comorbidities.  I told her to give [place] a call if she gets more symptomatic then we can try her on auto CPAP 5-15 cm water  if her home sleep study will still work.  Asthma Non smoker. With "asthmatic bronchitis". Better with prn meds.    Patient will follow up with me as needed.      Monica Becton, MD 08/22/2016   3:27 PM [facility] [place] Pager: [phone] Office: [phone], Fax: [phone]

## 2016-08-25 ENCOUNTER — Ambulatory Visit: Payer: Medicare Other

## 2016-08-26 ENCOUNTER — Encounter: Payer: Self-pay | Admitting: Family Medicine

## 2016-09-05 ENCOUNTER — Ambulatory Visit
Admission: RE | Admit: 2016-09-05 | Discharge: 2016-09-05 | Disposition: A | Discharge status: Home / Self Care | Payer: Medicare Other | Source: Ambulatory Visit | Attending: Family Medicine | Admitting: Family Medicine

## 2016-09-05 DIAGNOSIS — Z1231 Encounter for screening mammogram for malignant neoplasm of breast: Secondary | ICD-10-CM | POA: Diagnosis not present

## 2016-09-12 ENCOUNTER — Other Ambulatory Visit: Payer: Self-pay | Admitting: Family Medicine

## 2016-09-13 ENCOUNTER — Ambulatory Visit: Payer: Medicare Other | Admitting: Pulmonary Disease

## 2016-09-15 ENCOUNTER — Ambulatory Visit (HOSPITAL_BASED_OUTPATIENT_CLINIC_OR_DEPARTMENT_OTHER)
Admission: RE | Admit: 2016-09-15 | Discharge: 2016-09-15 | Disposition: A | Discharge status: Home / Self Care | Payer: Medicare Other | Source: Ambulatory Visit | Attending: Emergency Medicine | Admitting: Emergency Medicine

## 2016-09-15 ENCOUNTER — Other Ambulatory Visit (HOSPITAL_BASED_OUTPATIENT_CLINIC_OR_DEPARTMENT_OTHER): Payer: Self-pay | Admitting: Emergency Medicine

## 2016-09-15 DIAGNOSIS — M79661 Pain in right lower leg: Secondary | ICD-10-CM | POA: Diagnosis not present

## 2016-09-15 DIAGNOSIS — M79604 Pain in right leg: Secondary | ICD-10-CM | POA: Insufficient documentation

## 2016-09-15 DIAGNOSIS — M7989 Other specified soft tissue disorders: Secondary | ICD-10-CM | POA: Diagnosis not present

## 2016-09-15 DIAGNOSIS — M7121 Synovial cyst of popliteal space [Baker], right knee: Secondary | ICD-10-CM | POA: Diagnosis not present

## 2016-10-10 ENCOUNTER — Other Ambulatory Visit: Payer: Self-pay | Admitting: Family Medicine

## 2016-10-18 ENCOUNTER — Encounter: Payer: Self-pay | Admitting: Family Medicine

## 2016-11-21 DIAGNOSIS — S83281A Other tear of lateral meniscus, current injury, right knee, initial encounter: Secondary | ICD-10-CM | POA: Diagnosis not present

## 2017-01-20 ENCOUNTER — Ambulatory Visit: Payer: Medicare Other | Admitting: Family Medicine

## 2017-02-16 ENCOUNTER — Ambulatory Visit (INDEPENDENT_AMBULATORY_CARE_PROVIDER_SITE_OTHER): Payer: Medicare Other | Admitting: Family Medicine

## 2017-02-16 ENCOUNTER — Encounter: Payer: Self-pay | Admitting: Family Medicine

## 2017-02-16 VITALS — BP 130/80 | HR 69 | Temp 97.9°F | Resp 18 | Wt 208.2 lb

## 2017-02-16 DIAGNOSIS — E782 Mixed hyperlipidemia: Secondary | ICD-10-CM | POA: Diagnosis not present

## 2017-02-16 DIAGNOSIS — M7121 Synovial cyst of popliteal space [Baker], right knee: Secondary | ICD-10-CM

## 2017-02-16 DIAGNOSIS — C73 Malignant neoplasm of thyroid gland: Secondary | ICD-10-CM | POA: Diagnosis not present

## 2017-02-16 DIAGNOSIS — R002 Palpitations: Secondary | ICD-10-CM | POA: Diagnosis not present

## 2017-02-16 DIAGNOSIS — E669 Obesity, unspecified: Secondary | ICD-10-CM

## 2017-02-16 DIAGNOSIS — I10 Essential (primary) hypertension: Secondary | ICD-10-CM | POA: Diagnosis not present

## 2017-02-16 DIAGNOSIS — R739 Hyperglycemia, unspecified: Secondary | ICD-10-CM

## 2017-02-16 HISTORY — DX: Synovial cyst of popliteal space (Baker), right knee: M71.21

## 2017-02-16 LAB — COMPREHENSIVE METABOLIC PANEL
ALT: 22 U/L (ref 0–35)
AST: 23 U/L (ref 0–37)
Albumin: 3.8 g/dL (ref 3.5–5.2)
Alkaline Phosphatase: 80 U/L (ref 39–117)
BUN: 14 mg/dL (ref 6–23)
CO2: 32 mEq/L (ref 19–32)
Calcium: 9.3 mg/dL (ref 8.4–10.5)
Chloride: 99 mEq/L (ref 96–112)
Creatinine, Ser: 0.86 mg/dL (ref 0.40–1.20)
GFR: 84.39 mL/min (ref >60.00)
Glucose, Bld: 89 mg/dL (ref 70–99)
Potassium: 3.5 mEq/L (ref 3.5–5.1)
Sodium: 140 mEq/L (ref 135–145)
Total Bilirubin: 0.4 mg/dL (ref 0.2–1.2)
Total Protein: 7.1 g/dL (ref 6.0–8.3)

## 2017-02-16 LAB — LIPID PANEL
Cholesterol: 176 mg/dL (ref 0–200)
HDL: 44.7 mg/dL (ref >39.00)
LDL Cholesterol: 107 mg/dL — ABNORMAL HIGH (ref 0–99)
NonHDL: 131.31
Total CHOL/HDL Ratio: 4
Triglycerides: 124 mg/dL (ref 0.0–149.0)
VLDL: 24.8 mg/dL (ref 0.0–40.0)

## 2017-02-16 LAB — CBC
HCT: 40.6 % (ref 36.0–46.0)
Hemoglobin: 13.4 g/dL (ref 12.0–15.0)
MCHC: 33 g/dL (ref 30.0–36.0)
MCV: 90.4 fl (ref 78.0–100.0)
Platelets: 322 10*3/uL (ref 150.0–400.0)
RBC: 4.49 Mil/uL (ref 3.87–5.11)
RDW: 13.6 % (ref 11.5–15.5)
WBC: 9 10*3/uL (ref 4.0–10.5)

## 2017-02-16 LAB — TSH: TSH: 0.2 u[IU]/mL — ABNORMAL LOW (ref 0.35–4.50)

## 2017-02-16 LAB — HEMOGLOBIN A1C: Hgb A1c MFr Bld: 6.2 % (ref 4.6–6.5)

## 2017-02-16 MED ORDER — SYNTHROID 150 MCG PO TABS: ORAL_TABLET | ORAL | 5 refills | Status: DC

## 2017-02-16 NOTE — Assessment & Plan Note (Signed)
Had a few episodes after starting back to work. She has not had any associated symptoms. Let us know if it changes.

## 2017-02-16 NOTE — Assessment & Plan Note (Signed)
Well controlled, no changes to meds. Encouraged heart healthy diet such as the DASH diet and exercise as tolerated.  °

## 2017-02-16 NOTE — Assessment & Plan Note (Signed)
Flared lately sees Dr Noemi Chapel will call for appointment

## 2017-02-16 NOTE — Assessment & Plan Note (Signed)
hgba1c acceptable, minimize simple carbs. Increase exercise as tolerated.  

## 2017-02-16 NOTE — Patient Instructions (Signed)
Shingrix 2 shots over 6 months at pharmacy Hypertension Hypertension is another name for high blood pressure. High blood pressure forces your heart to work harder to pump blood. This can cause problems over time. There are two numbers in a blood pressure reading. There is a top number (systolic) over a bottom number (diastolic). It is best to have a blood pressure below 120/80. Healthy choices can help lower your blood pressure. You may need medicine to help lower your blood pressure if:  Your blood pressure cannot be lowered with healthy choices.  Your blood pressure is higher than 130/80.  Follow these instructions at home: Eating and drinking  If directed, follow the DASH eating plan. This diet includes: ? Filling half of your plate at each meal with fruits and vegetables. ? Filling one quarter of your plate at each meal with whole grains. Whole grains include whole wheat pasta, brown rice, and whole grain bread. ? Eating or drinking low-fat dairy products, such as skim milk or low-fat yogurt. ? Filling one quarter of your plate at each meal with low-fat (lean) proteins. Low-fat proteins include fish, skinless chicken, eggs, beans, and tofu. ? Avoiding fatty meat, cured and processed meat, or chicken with skin. ? Avoiding premade or processed food.  Eat less than 1,500 mg of salt (sodium) a day.  Limit alcohol use to no more than 1 drink a day for nonpregnant women and 2 drinks a day for men. One drink equals 12 oz of beer, 5 oz of wine, or 1 oz of hard liquor. Lifestyle  Work with your doctor to stay at a healthy weight or to lose weight. Ask your doctor what the best weight is for you.  Get at least 30 minutes of exercise that causes your heart to beat faster (aerobic exercise) most days of the week. This may include walking, swimming, or biking.  Get at least 30 minutes of exercise that strengthens your muscles (resistance exercise) at least 3 days a week. This may include lifting  weights or pilates.  Do not use any products that contain nicotine or tobacco. This includes cigarettes and e-cigarettes. If you need help quitting, ask your doctor.  Check your blood pressure at home as told by your doctor.  Keep all follow-up visits as told by your doctor. This is important. Medicines  Take over-the-counter and prescription medicines only as told by your doctor. Follow directions carefully.  Do not skip doses of blood pressure medicine. The medicine does not work as well if you skip doses. Skipping doses also puts you at risk for problems.  Ask your doctor about side effects or reactions to medicines that you should watch for. Contact a doctor if:  You think you are having a reaction to the medicine you are taking.  You have headaches that keep coming back (recurring).  You feel dizzy.  You have swelling in your ankles.  You have trouble with your vision. Get help right away if:  You get a very bad headache.  You start to feel confused.  You feel weak or numb.  You feel faint.  You get very bad pain in your: ? Chest. ? Belly (abdomen).  You throw up (vomit) more than once.  You have trouble breathing. Summary  Hypertension is another name for high blood pressure.  Making healthy choices can help lower blood pressure. If your blood pressure cannot be controlled with healthy choices, you may need to take medicine. This information is not intended to replace  advice given to you by your health care provider. Make sure you discuss any questions you have with your health care provider. Document Released: 12/21/2007 Document Revised: 06/01/2016 Document Reviewed: 06/01/2016 Elsevier Interactive Patient Education  [employer].

## 2017-02-16 NOTE — Assessment & Plan Note (Signed)
Encouraged DASH diet, decrease po intake and increase exercise as tolerated. Needs 7-8 hours of sleep nightly. Avoid trans fats, eat small, frequent meals every 4-5 hours with lean proteins, complex carbs and healthy fats. Minimize simple carbs.  Bariatric referral 

## 2017-02-16 NOTE — Progress Notes (Signed)
Subjective:  I acted as a Education administrator for Dr. Charlett Blake. [place], [place]  Patient ID: Monica Wallace, female    DOB: 1950/10/02, 66 y.o.   MRN: [id number]  No chief complaint on file.   HPI  Patient is in today for a follow up and she is doing well. 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 She also cares for her ailing mother. No recent febrile illness or hospitalization. No acute concerns noted today. She acknowledges palpitations have recurred since return to work.  No associated symptoms such as shortness of breath chest pain when the palpitations occur and they're short-lived. Do not occur daily and do not awaken her. Denies CP/SOB/HA/congestion/fevers/GI or GU c/o. Taking meds as prescribed  Patient Care Team: [name], MD as PCP - General (Family Medicine) Philemon Kingdom, MD as Consulting Physician (Internal Medicine) Juanita Craver, MD as Consulting Physician (Gastroenterology) [place], Mike Gip, MD as Consulting Physician (Pulmonary Disease) Juanita Craver, MD as Consulting Physician (Gastroenterology)   Past Medical History:  Diagnosis Date  . Arthritis   . Baker's cyst of knee, right 02/16/2017  . Cancer ([place])    thyroid (2002)  . Cystic breast   . GERD (gastroesophageal reflux disease)   . Hepatic cyst 02/02/2015  . Hyperlipidemia   . Hypertension   . Insomnia 07/23/2016  . Menopause   . Migraine   . Palpitations   . Preventative health care 01/18/2016  . Sleep apnea 02/02/2015  . Thyroid disease     Past Surgical History:  Procedure Laterality Date  . ABDOMINAL HYSTERECTOMY     took right ovary and uterus  . APPENDECTOMY    . CARPAL TUNNEL RELEASE Right   . CESAREAN SECTION    . CHOLECYSTECTOMY    . EYE SURGERY Bilateral 2017   cataracts  . KNEE ARTHROPLASTY      Family History  Problem Relation Age of Onset  . Arthritis Mother   . Transient ischemic attack Mother   . Hypertension Mother   . Hyperlipidemia Mother   . Diabetes Father    . Heart disease Father   . Arthritis Father   . Kidney disease Father   . Hypertension Father   . Hyperlipidemia Father   . Cancer Sister 91       pancreatic  . Fibromyalgia Sister   . Asthma Maternal Grandfather        smoker  . Depression Maternal Grandfather   . Dementia Paternal Grandmother   . Diabetes Paternal Grandfather   . Hypertension Brother   . Scoliosis Daughter        had rods placed and removed  . Arthritis Sister   . Gout Sister   . GER disease Sister   . Cancer Sister        breast cancer  . Breast cancer Sister 35    Social History   Social History  . Marital status: Divorced    Spouse name: N/A  . Number of children: N/A  . Years of education: N/A   Occupational History  . Not on file.   Social History Main Topics  . Smoking status: Never Smoker  . Smokeless tobacco: Never Used  . Alcohol use No  . Drug use: No  . Sexual activity: No     Comment: Hysterectomy, lives with mother   Other Topics Concern  . Not on file   Social History Narrative  . No narrative on file  Outpatient Medications Prior to Visit  Medication Sig Dispense Refill  . acetaminophen (TYLENOL) 325 MG tablet Take 650 mg by mouth every 6 (six) hours as needed.    Marland Kitchen albuterol (PROAIR HFA) 108 (90 Base) MCG/ACT inhaler Inhale 2 inhaltions TID prn 8.5 g 0  . b complex-C-folic acid 1 MG capsule Take 1 capsule by mouth daily.    . Cholecalciferol ([employer] VIT D 5000 HIGH-POTENCY PO) Take 5,000 Units by mouth.    . fluticasone (FLONASE) 50 MCG/ACT nasal spray Place 2 sprays into both nostrils daily. 16 g 6  . loratadine (CLARITIN) 10 MG tablet Take 10 mg by mouth daily.    Marland Kitchen OVER THE COUNTER MEDICATION Potassium    . SYNTHROID 150 MCG tablet TAKE 1 TABLET BY MOUTH EVERY DAY BEFORE BREAKFAST 30 tablet 6  . triamterene-hydrochlorothiazide (MAXZIDE) 75-50 MG tablet TAKE 1 TABLET BY MOUTH DAILY 30 tablet 5  . SYNTHROID 150 MCG tablet TAKE 1 TABLET BY MOUTH EVERY DAY BEFORE  BREAKFAST 30 tablet 5  . triamterene-hydrochlorothiazide (MAXZIDE) 75-50 MG tablet Take 1 tablet by mouth daily. 30 tablet 6   No facility-administered medications prior to visit.     Allergies  Allergen Reactions  . Levaquin [Levofloxacin] Shortness Of Breath  . Penicillins     Positive on allergy testing 'years ago.' Has never had penicillin. Has tolerated amoxicillin.  . Codeine Itching    Review of Systems  Constitutional: Negative for fever and malaise/fatigue.  Respiratory: Negative for shortness of breath.   Cardiovascular: Positive for palpitations. Negative for chest pain and leg swelling.  Gastrointestinal: Negative for abdominal pain, blood in stool and nausea.  Skin: Negative for rash.  Neurological: Negative for loss of consciousness and headaches.  Endo/Heme/Allergies: Negative for environmental allergies.  Psychiatric/Behavioral: Negative for depression. The patient is not nervous/anxious.        Objective:    Physical Exam  Constitutional: She is oriented to person, place, and time. She appears well-developed and well-nourished. No distress.  HENT:  Head: Normocephalic and atraumatic.  Nose: Nose normal.  Eyes: Right eye exhibits no discharge. Left eye exhibits no discharge.  Neck: Normal range of motion. Neck supple.  Cardiovascular: Normal rate and regular rhythm.   No murmur heard. Pulmonary/Chest: Effort normal and breath sounds normal.  Abdominal: Soft. Bowel sounds are normal. There is no tenderness.  Musculoskeletal: She exhibits no edema.  Neurological: She is alert and oriented to person, place, and time.  Skin: Skin is warm and dry.  Psychiatric: She has a normal mood and affect.  Nursing note and vitals reviewed.   BP 130/80 (BP Location: Left Arm, Patient Position: Sitting, Cuff Size: Normal)   Pulse 69   Temp 97.9 F (36.6 C) (Oral)   Resp 18   Wt 208 lb 3.2 oz (94.4 kg)   SpO2 94%   BMI 38.08 kg/m  Wt Readings from Last 3  Encounters:  02/16/17 208 lb 3.2 oz (94.4 kg)  08/22/16 212 lb 6.4 oz (96.3 kg)  07/21/16 211 lb 3.2 oz (95.8 kg)   BP Readings from Last 3 Encounters:  02/16/17 130/80  08/22/16 130/70  07/21/16 130/80     Immunization History  Administered Date(s) Administered  . Influenza-Unspecified 03/19/2015, 05/09/2016  . Pneumococcal Conjugate-13 01/18/2016  . Tdap 07/01/2014    Health Maintenance  Topic Date Due  . COLON CANCER SCREENING ANNUAL FOBT  07/02/2015  . PNA vac Low Risk Adult (2 of 2 - PPSV23) 01/17/2017  . INFLUENZA VACCINE  02/15/2017  . MAMMOGRAM  09/05/2018  . TETANUS/TDAP  07/01/2024  . DEXA SCAN  Completed  . Hepatitis C Screening  Completed    Lab Results  Component Value Date   WBC 10.9 (H) 01/18/2016   HGB 13.0 01/18/2016   HCT 39.2 01/18/2016   PLT 325.0 01/18/2016   GLUCOSE 87 01/18/2016   CHOL 196 01/18/2016   TRIG 115.0 01/18/2016   HDL 52.30 01/18/2016   LDLCALC 121 (H) 01/18/2016   ALT 28 01/18/2016   AST 31 01/18/2016   NA 135 01/18/2016   K 3.4 (L) 01/18/2016   CL 101 01/18/2016   CREATININE 0.90 01/18/2016   BUN 14 01/18/2016   CO2 30 01/18/2016   TSH 2.57 01/18/2016   HGBA1C 6.2 01/18/2016    Lab Results  Component Value Date   TSH 2.57 01/18/2016   Lab Results  Component Value Date   WBC 10.9 (H) 01/18/2016   HGB 13.0 01/18/2016   HCT 39.2 01/18/2016   MCV 89.7 01/18/2016   PLT 325.0 01/18/2016   Lab Results  Component Value Date   NA 135 01/18/2016   K 3.4 (L) 01/18/2016   CO2 30 01/18/2016   GLUCOSE 87 01/18/2016   BUN 14 01/18/2016   CREATININE 0.90 01/18/2016   BILITOT 0.3 01/18/2016   ALKPHOS 92 01/18/2016   AST 31 01/18/2016   ALT 28 01/18/2016   PROT 7.3 01/18/2016   ALBUMIN 4.0 01/18/2016   CALCIUM 9.5 01/18/2016   ANIONGAP 6 09/15/2014   GFR 80.34 01/18/2016   Lab Results  Component Value Date   CHOL 196 01/18/2016   Lab Results  Component Value Date   HDL 52.30 01/18/2016   Lab Results    Component Value Date   LDLCALC 121 (H) 01/18/2016   Lab Results  Component Value Date   TRIG 115.0 01/18/2016   Lab Results  Component Value Date   CHOLHDL 4 01/18/2016   Lab Results  Component Value Date   HGBA1C 6.2 01/18/2016         Assessment & Plan:   Problem List Items Addressed This Visit    Hyperlipidemia    Encouraged heart healthy diet, increase exercise, avoid trans fats, consider a krill oil cap daily      Relevant Orders   Lipid panel   Thyroid cancer ([place]) - Primary   Relevant Medications   SYNTHROID 150 MCG tablet   Other Relevant Orders   Ambulatory referral to Endocrinology   HTN (hypertension)    Well controlled, no changes to meds. Encouraged heart healthy diet such as the DASH diet and exercise as tolerated.       Relevant Orders   CBC   Comprehensive metabolic panel   TSH   Palpitations    Had a few episodes after starting back to work. She has not had any associated symptoms. Let us know if it changes.       Obesity (BMI 30-39.9)    Encouraged DASH diet, decrease po intake and increase exercise as tolerated. Needs 7-8 hours of sleep nightly. Avoid trans fats, eat small, frequent meals every 4-5 hours with lean proteins, complex carbs and healthy fats. Minimize simple carbs. Bariatric referral      Hyperglycemia    hgba1c acceptable, minimize simple carbs. Increase exercise as tolerated.       Relevant Orders   Hemoglobin A1c   Baker's cyst of knee, right    Flared lately sees Dr Noemi Chapel will call for appointment  I am having Ms. Moehle maintain her b complex-C-folic acid, loratadine, Cholecalciferol ([facility] VIT D 5000 HIGH-POTENCY PO), OVER THE COUNTER MEDICATION, acetaminophen, albuterol, SYNTHROID, fluticasone, triamterene-hydrochlorothiazide, and SYNTHROID.  Meds ordered this encounter  Medications  . SYNTHROID 150 MCG tablet    Sig: TAKE 1 TABLET BY MOUTH EVERY DAY BEFORE BREAKFAST    Dispense:  30 tablet    Refill:   5    CMA served as scribe during this visit. History, Physical and Plan performed by medical provider. Documentation and orders reviewed and attested to.  [name], MD

## 2017-02-16 NOTE — Assessment & Plan Note (Signed)
Encouraged heart healthy diet, increase exercise, avoid trans fats, consider a krill oil cap daily 

## 2017-03-30 ENCOUNTER — Other Ambulatory Visit (HOSPITAL_BASED_OUTPATIENT_CLINIC_OR_DEPARTMENT_OTHER): Payer: Self-pay | Admitting: Orthopedic Surgery

## 2017-03-30 DIAGNOSIS — S83281D Other tear of lateral meniscus, current injury, right knee, subsequent encounter: Secondary | ICD-10-CM | POA: Diagnosis not present

## 2017-03-30 DIAGNOSIS — M25561 Pain in right knee: Secondary | ICD-10-CM

## 2017-04-08 ENCOUNTER — Ambulatory Visit (HOSPITAL_BASED_OUTPATIENT_CLINIC_OR_DEPARTMENT_OTHER)
Admission: RE | Admit: 2017-04-08 | Discharge: 2017-04-08 | Disposition: A | Discharge status: Home / Self Care | Payer: Medicare Other | Source: Ambulatory Visit | Attending: Orthopedic Surgery | Admitting: Orthopedic Surgery

## 2017-04-08 ENCOUNTER — Other Ambulatory Visit: Payer: Self-pay | Admitting: Family Medicine

## 2017-04-08 DIAGNOSIS — M25461 Effusion, right knee: Secondary | ICD-10-CM | POA: Insufficient documentation

## 2017-04-08 DIAGNOSIS — S83241A Other tear of medial meniscus, current injury, right knee, initial encounter: Secondary | ICD-10-CM | POA: Insufficient documentation

## 2017-04-08 DIAGNOSIS — S83281A Other tear of lateral meniscus, current injury, right knee, initial encounter: Secondary | ICD-10-CM | POA: Insufficient documentation

## 2017-04-08 DIAGNOSIS — X58XXXA Exposure to other specified factors, initial encounter: Secondary | ICD-10-CM | POA: Diagnosis not present

## 2017-04-08 DIAGNOSIS — M67461 Ganglion, right knee: Secondary | ICD-10-CM | POA: Diagnosis not present

## 2017-04-08 DIAGNOSIS — M25561 Pain in right knee: Secondary | ICD-10-CM | POA: Insufficient documentation

## 2017-05-03 DIAGNOSIS — Z23 Encounter for immunization: Secondary | ICD-10-CM | POA: Diagnosis not present

## 2017-05-05 ENCOUNTER — Encounter: Payer: Self-pay | Admitting: Medical

## 2017-05-05 ENCOUNTER — Ambulatory Visit (INDEPENDENT_AMBULATORY_CARE_PROVIDER_SITE_OTHER): Payer: Medicare Other | Admitting: Medical

## 2017-05-05 VITALS — BP 124/67 | HR 90 | Temp 98.1°F | Resp 16

## 2017-05-05 DIAGNOSIS — S83241A Other tear of medial meniscus, current injury, right knee, initial encounter: Secondary | ICD-10-CM

## 2017-05-05 DIAGNOSIS — R002 Palpitations: Secondary | ICD-10-CM | POA: Diagnosis not present

## 2017-05-05 DIAGNOSIS — R7989 Other specified abnormal findings of blood chemistry: Secondary | ICD-10-CM

## 2017-05-05 NOTE — Patient Instructions (Addendum)
For your history of recent palpitations and your upcoming surgery at the end of October, I will refer you to cardiologist and get their opinion if Holter is needed prior to surgery.   Your EKG looks normal today and on auscultation you haveood steady normal rhythm.  I want you to avoid any decongestant products and no caffeine use.  It is Friday late afternoon and I need to advise you to the recurrent symptoms such as severe palpitations or other heart Symptoms then be seen at the emergency department.  I will go ahead and place cardiologist referral today and asked that the by Tuesday or Wednesday.  Follow-up date to be determined after cardiologist evaluation.

## 2017-05-05 NOTE — Progress Notes (Signed)
Subjective:    Patient ID: Monica Wallace, female    DOB: 12/01/1950, 66 y.o.   MRN: [id number]  HPI  Pt in for evaluation.  Pt states she has upcoming surgery for meniscus injury/tear on October 31,2018. She had mri found tear in 2 places.  Pt indicates states last 2-3 months some rare occasional palpitaitions  and some recent events past week more severe/prolonged. Pt denies any chest pain, no sob, no jaw pain or arm pain with these.   Last night at work starting feeling some palpitations. When she got home was more prominent. She fell asleep with palpitations. She worked yesterday  And he drank a coke yesterday. Often in past she thinks sees pattern with slight palpitation and caffeine.   Most of time when works will drink one beverage with caffeine.   She reports off and on palpitations since mid summer.   She mentioned remote hx of palpitation in past but on review I don't see that she ever had holter.  Pt uses albuterol rarely. No current wheezing.    Review of Systems  Constitutional: Negative for chills, fatigue and fever.  Respiratory: Negative for cough, chest tightness, shortness of breath and wheezing.   Cardiovascular: Negative for chest pain and palpitations.       Pt mentioned maybe faint low level palpitation.   Gastrointestinal: Negative for abdominal pain.  Musculoskeletal: Negative for back pain, joint swelling, myalgias and neck stiffness.  Skin: Negative for rash.  Hematological: Negative for adenopathy. Does not bruise/bleed easily.  Psychiatric/Behavioral: Negative for behavioral problems and decreased concentration.    Past Medical History:  Diagnosis Date  . Arthritis   . Baker's cyst of knee, right 02/16/2017  . Cancer ([place])    thyroid (2002)  . Cystic breast   . GERD (gastroesophageal reflux disease)   . Hepatic cyst 02/02/2015  . Hyperlipidemia   . Hypertension   . Insomnia 07/23/2016  . Menopause   . Migraine   . Palpitations   . Preventative  health care 01/18/2016  . Sleep apnea 02/02/2015  . Thyroid disease      Social History   Social History  . Marital status: Divorced    Spouse name: N/A  . Number of children: N/A  . Years of education: N/A   Occupational History  . Not on file.   Social History Main Topics  . Smoking status: Never Smoker  . Smokeless tobacco: Never Used  . Alcohol use No  . Drug use: No  . Sexual activity: No     Comment: Hysterectomy, lives with mother   Other Topics Concern  . Not on file   Social History Narrative  . No narrative on file    Past Surgical History:  Procedure Laterality Date  . ABDOMINAL HYSTERECTOMY     took right ovary and uterus  . APPENDECTOMY    . CARPAL TUNNEL RELEASE Right   . CESAREAN SECTION    . CHOLECYSTECTOMY    . EYE SURGERY Bilateral 2017   cataracts  . KNEE ARTHROPLASTY      Family History  Problem Relation Age of Onset  . Arthritis Mother   . Transient ischemic attack Mother   . Hypertension Mother   . Hyperlipidemia Mother   . Diabetes Father   . Heart disease Father   . Arthritis Father   . Kidney disease Father   . Hypertension Father   . Hyperlipidemia Father   . Cancer Sister 98  pancreatic  . Fibromyalgia Sister   . Asthma Maternal Grandfather        smoker  . Depression Maternal Grandfather   . Dementia Paternal Grandmother   . Diabetes Paternal Grandfather   . Hypertension Brother   . Scoliosis Daughter        had rods placed and removed  . Arthritis Sister   . Gout Sister   . GER disease Sister   . Cancer Sister        breast cancer  . Breast cancer Sister 31    Allergies  Allergen Reactions  . Levaquin [Levofloxacin] Shortness Of Breath  . Penicillins     Positive on allergy testing 'years ago.' Has never had penicillin. Has tolerated amoxicillin.  . Codeine Itching    Current Outpatient Prescriptions on File Prior to Visit  Medication Sig Dispense Refill  . acetaminophen (TYLENOL) 325 MG tablet Take  650 mg by mouth every 6 (six) hours as needed.    Marland Kitchen albuterol (PROAIR HFA) 108 (90 Base) MCG/ACT inhaler Inhale 2 inhaltions TID prn 8.5 g 0  . b complex-C-folic acid 1 MG capsule Take 1 capsule by mouth daily.    . Cholecalciferol ([facility]S VIT D 5000 HIGH-POTENCY PO) Take 5,000 Units by mouth.    . fluticasone (FLONASE) 50 MCG/ACT nasal spray Place 2 sprays into both nostrils daily. 16 g 6  . loratadine (CLARITIN) 10 MG tablet Take 10 mg by mouth daily.    Marland Kitchen OVER THE COUNTER MEDICATION Potassium    . SYNTHROID 150 MCG tablet TAKE 1 TABLET BY MOUTH EVERY DAY BEFORE BREAKFAST 30 tablet 6  . SYNTHROID 150 MCG tablet TAKE 1 TABLET BY MOUTH EVERY DAY BEFORE BREAKFAST 30 tablet 5  . SYNTHROID 150 MCG tablet TAKE 1 TABLET BY MOUTH EVERY DAY BEFORE BREAKFAST 30 tablet 0  . triamterene-hydrochlorothiazide (MAXZIDE) 75-50 MG tablet TAKE 1 TABLET BY MOUTH DAILY 30 tablet 0   No current facility-administered medications on file prior to visit.     BP 124/67   Pulse 90   Temp 98.1 F (36.7 C) (Oral)   Resp 16   SpO2 99%       Objective:   Physical Exam   General Mental Status- Alert. General Appearance- Not in acute distress.   Skin General: Color- Normal Color. Moisture- Normal Moisture.  Neck Carotid Arteries- Normal color. Moisture- Normal Moisture. No carotid bruits. No JVD.  Chest and Lung Exam Auscultation: Breath Sounds:-Normal.  Cardiovascular Auscultation:Rythm- Regular. Murmurs & Other Heart Sounds:Auscultation of the heart reveals- No Murmurs.  Abdomen Inspection:-Inspeection Normal. Palpation/Percussion:Note:No mass. Palpation and Percussion of the abdomen reveal- Non Tender, Non Distended + BS, no rebound or guarding.    Neurologic Cranial Nerve exam:- CN III-XII intact(No nystagmus), symmetric smile. Strength:- 5/5 equal and symmetric strength both upper and lower extremities.    Assessment & Plan:  For your history of recent palpitations and your upcoming  surgery at the end of October, I will refer you to cardiologist and get their opinion if Holter is needed prior to surgery.   Your EKG looks normal today and on auscultation you haveood steady normal rhythm.  I want you to avoid any decongestant products and no caffeine use.  It is Friday late afternoon and I need to advise you to the recurrent symptoms such as severe palpitations or other heart Symptoms then be seen at the emergency department.  I will go ahead and place cardiologist referral today and asked that the by Tuesday or  Wednesday.  Follow-up date to be determined after cardiologist evaluation.  Naziah Weckerly, Percell Miller, PA-C

## 2017-05-10 ENCOUNTER — Encounter: Payer: Self-pay | Admitting: Cardiology

## 2017-05-10 ENCOUNTER — Ambulatory Visit (INDEPENDENT_AMBULATORY_CARE_PROVIDER_SITE_OTHER): Payer: Medicare Other | Admitting: Cardiology

## 2017-05-10 ENCOUNTER — Ambulatory Visit (HOSPITAL_BASED_OUTPATIENT_CLINIC_OR_DEPARTMENT_OTHER)
Admission: RE | Admit: 2017-05-10 | Discharge: 2017-05-10 | Disposition: A | Discharge status: Home / Self Care | Payer: Medicare Other | Source: Ambulatory Visit | Attending: Cardiology | Admitting: Cardiology

## 2017-05-10 VITALS — BP 138/76 | HR 70 | Ht 62.0 in | Wt 209.0 lb

## 2017-05-10 DIAGNOSIS — R002 Palpitations: Secondary | ICD-10-CM | POA: Diagnosis not present

## 2017-05-10 DIAGNOSIS — I10 Essential (primary) hypertension: Secondary | ICD-10-CM

## 2017-05-10 DIAGNOSIS — E890 Postprocedural hypothyroidism: Secondary | ICD-10-CM | POA: Diagnosis not present

## 2017-05-10 LAB — ECHOCARDIOGRAM COMPLETE
Height: 62 in
Weight: 3344 oz

## 2017-05-10 NOTE — Progress Notes (Signed)
Cardiology Consultation:    Date:  05/10/2017   ID:  Monica Wallace, DOB [birth year], MRN [id number]  PCP:  [name], MD  Cardiologist:  Jenne Campus, MD   Referring MD: Elise Benne   Chief Complaint  Patient presents with  . New Patient (Initial Visit)    pre-op clearance Surgery Next Wendnesday   . Palpitations    has hx of Mitral Vlave Prolapse   And having palpitations  History of Present Illness:    Monica Wallace is a 66 y.o. female who is being seen today for the evaluation of palpitations at the request of Monica Wallace, Monica Wallace, [place]. She was diagnosed with mitral valve prolapse more than 30 years ago at that time she had some palpitations. She was given beta blocker with good response then beta blocker was discontinued and she was doing fine until recently she started having palpitations. Happening maybe once a week she describes skipped beats. No sustained arrhythmias. Dose to be happening at the evening time when she is trying to relax. There is no chest pain no tightness no squeezing or pressure in the burning in the chest. She is scheduled to have right knee surgery apparently meniscus need to be repaired. It will be done under general anesthesia. In spite of problem with her knee she said she is able to walk and climb stairs with no difficulties her exercise tolerance is good. No history of passing out or dizziness.  Past Medical History:  Diagnosis Date  . Arthritis   . Baker's cyst of knee, right 02/16/2017  . Cancer ([place])    thyroid (2002)  . Cystic breast   . GERD (gastroesophageal reflux disease)   . Hepatic cyst 02/02/2015  . Hyperlipidemia   . Hypertension   . Insomnia 07/23/2016  . Menopause   . Migraine   . Palpitations   . Preventative health care 01/18/2016  . Sleep apnea 02/02/2015  . Thyroid disease     Past Surgical History:  Procedure Laterality Date  . ABDOMINAL HYSTERECTOMY     took right ovary and uterus  . APPENDECTOMY    . CARPAL  TUNNEL RELEASE Right   . CESAREAN SECTION    . CHOLECYSTECTOMY    . EYE SURGERY Bilateral 2017   cataracts  . KNEE ARTHROPLASTY      Current Medications: Current Meds  Medication Sig  . acetaminophen (TYLENOL) 325 MG tablet Take 650 mg by mouth every 6 (six) hours as needed.  Marland Kitchen albuterol (PROAIR HFA) 108 (90 Base) MCG/ACT inhaler Inhale 2 inhaltions TID prn  . b complex-C-folic acid 1 MG capsule Take 1 capsule by mouth daily.  . Cholecalciferol (CVS VIT D 5000 HIGH-POTENCY PO) Take 5,000 Units by mouth.  . fluticasone (FLONASE) 50 MCG/ACT nasal spray Place 2 sprays into both nostrils daily.  Marland Kitchen loratadine (CLARITIN) 10 MG tablet Take 10 mg by mouth daily.  Marland Kitchen OVER THE COUNTER MEDICATION Potassium  . SYNTHROID 150 MCG tablet TAKE 1 TABLET BY MOUTH EVERY DAY BEFORE BREAKFAST  . triamterene-hydrochlorothiazide (MAXZIDE) 75-50 MG tablet TAKE 1 TABLET BY MOUTH DAILY     Allergies:   Levaquin [levofloxacin]; Penicillins; and Codeine   Social History   Social History  . Marital status: Divorced    Spouse name: N/A  . Number of children: N/A  . Years of education: N/A   Social History Main Topics  . Smoking status: Never Smoker  . Smokeless tobacco: Never Used  . Alcohol use No  . Drug  use: No  . Sexual activity: No     Comment: Hysterectomy, lives with mother   Other Topics Concern  . None   Social History Narrative  . None     Family History: The patient's family history includes Arthritis in her father, mother, and sister; Asthma in her maternal grandfather; Breast cancer (age of onset: 5) in her sister; Cancer in her sister; Cancer (age of onset: 54) in her sister; Dementia in her paternal grandmother; Depression in her maternal grandfather; Diabetes in her father and paternal grandfather; Fibromyalgia in her sister; GER disease in her sister; Gout in her sister; Heart disease in her father; Hyperlipidemia in her father and mother; Hypertension in her brother, father, and  mother; Kidney disease in her father; Scoliosis in her daughter; Transient ischemic attack in her mother. ROS:   Please see the history of present illness.    All 14 point review of systems negative except as described per history of present illness.  EKGs/Labs/Other Studies Reviewed:    The following studies were reviewed today: EKG showed normal sinus rhythm, normal interval, normal QS concentration morphology, normal ST segment   Recent Labs: 02/16/2017: ALT 22; BUN 14; Creatinine, Ser 0.86; Hemoglobin 13.4; Platelets 322.0; Potassium 3.5; Sodium 140; TSH 0.20  Recent Lipid Panel    Component Value Date/Time   CHOL 176 02/16/2017 0905   TRIG 124.0 02/16/2017 0905   HDL 44.70 02/16/2017 0905   CHOLHDL 4 02/16/2017 0905   VLDL 24.8 02/16/2017 0905   LDLCALC 107 (H) 02/16/2017 0905    Physical Exam:    VS:  BP 138/76   Pulse 70   Ht 5\' 2"  (1.575 m)   Wt 209 lb (94.8 kg)   SpO2 97%   BMI 38.23 kg/m     Wt Readings from Last 3 Encounters:  05/10/17 209 lb (94.8 kg)  02/16/17 208 lb 3.2 oz (94.4 kg)  08/22/16 212 lb 6.4 oz (96.3 kg)     GEN:  Well nourished, well developed in no acute distress HEENT: Normal NECK: No JVD; No carotid bruits LYMPHATICS: No lymphadenopathy CARDIAC: RRR, no murmurs, no rubs, no gallops RESPIRATORY:  Clear to auscultation without rales, wheezing or rhonchi  ABDOMEN: Soft, non-tender, non-distended MUSCULOSKELETAL:  No edema; No deformity  SKIN: Warm and dry NEUROLOGIC:  Alert and oriented x 3 PSYCHIATRIC:  Normal affect   ASSESSMENT:    1. Essential hypertension   2. Palpitations   3. Postoperative hypothyroidism    PLAN:    In order of problems listed above:  1. Essential hypertension: Blood pressure seems to be well-controlled continue present management. 2. Palpitations: I will ask her to have echocardiogram to assess left ventricular ejection fraction and check for mitral valve prolapse. However on physical exam I do not hear  any click. I doubt she will have significant findings. I will not initiate any medications at the moment. She will have event recorder after knee surgery. 3. Hypothyroidism: Managed with Synthroid by primary care physician. 4. D required knee surgery under general esthesia I don't see any cardiac contraindication for this procedure. She has fairly good exercise tolerance without any cardiac symptoms. Her EKG is normal.   Medication Adjustments/Labs and Tests Ordered: Current medicines are reviewed at length with the patient today.  Concerns regarding medicines are outlined above.  No orders of the defined types were placed in this encounter.  No orders of the defined types were placed in this encounter.   Signed, Park Liter, MD,  [place]. 05/10/2017 10:07 AM    [facility]

## 2017-05-10 NOTE — Progress Notes (Signed)
  Echocardiogram 2D Echocardiogram has been performed.  Monica Wallace 05/10/2017, 12:23 PM

## 2017-05-10 NOTE — Patient Instructions (Addendum)
Medication Instructions:  Your physician recommends that you continue on your current medications as directed. Please refer to the Current Medication list given to you today.  1. Avoid all over-the-counter antihistamines except Claritin/Loratadine and Zyrtec/Cetrizine. 2. Avoid all combination including cold sinus allergies flu decongestant and sleep medications 3. You can use Robitussin DM Mucinex and Mucinex DM for cough. 4. can use Tylenol aspirin ibuprofen and naproxen but no combinations such as sleep or sinus.  Labwork: None   Testing/Procedures: Your physician has recommended that you wear an event monitor. Event monitors are medical devices that record the heart's electrical activity. Doctors most often [place] these monitors to diagnose arrhythmias. Arrhythmias are problems with the speed or rhythm of the heartbeat. The monitor is a small, portable device. You can wear one while you do your normal daily activities. This is usually used to diagnose what is causing palpitations/syncope (passing out).  EKG today in office.   Your physician has requested that you have an echocardiogram. Echocardiography is a painless test that uses sound waves to create images of your heart. It provides your doctor with information about the size and shape of your heart and how well your heart's chambers and valves are working. This procedure takes approximately one hour. There are no restrictions for this procedure.   Cardiac Event Monitoring A cardiac event monitor is a small recording device that is used to detect abnormal heart rhythms (arrhythmias). The monitor is used to record your heart rhythm when you have symptoms, such as:  Fast heartbeats (palpitations), such as heart racing or fluttering.  Dizziness.  Fainting or light-headedness.  Unexplained weakness.  Some monitors are wired to electrodes placed on your chest. Electrodes are flat, sticky disks that attach to your skin. Other monitors may  be hand-held or worn on the wrist. The monitor can be worn for up to 30 days. If the monitor is attached to your chest, a technician will prepare your chest for the electrode placement and show you how to work the monitor. Take time to practice using the monitor before you leave the office. Make sure you understand how to send the information from the monitor to your health care provider. In some cases, you may need to use a landline telephone instead of a cell phone. What are the risks? Generally, this device is safe to use, but it possible that the skin under the electrodes will become irritated. How to use your cardiac event monitor  Wear your monitor at all times, except when you are in water: ? Do not let the monitor get wet. ? Take the monitor off when you bathe. Do not swim or use a hot tub with it on.  Keep your skin clean. Do not put body lotion or moisturizer on your chest.  Change the electrodes as told by your health care provider or any time they stop sticking to your skin. You may need to use medical tape to keep them on.  Try to put the electrodes in slightly different places on your chest to help prevent skin irritation. They must remain in the area under your left breast and in the upper right section of your chest.  Make sure the monitor is safely clipped to your clothing or in a location close to your body that your health care provider recommends.  Press the button to record as soon as you feel heart-related symptoms, such as: ? Dizziness. ? Weakness. ? Light-headedness. ? Palpitations. ? Thumping or pounding in your chest. ?  Shortness of breath. ? Unexplained weakness.  Keep a diary of your activities, such as walking, doing chores, and taking medicine. It is very important to note what you were doing when you pushed the button to record your symptoms. This will help your health care provider determine what might be contributing to your symptoms.  Send the recorded  information as recommended by your health care provider. It may take some time for your health care provider to process the results.  Change the batteries as told by your health care provider.  Keep electronic devices away from your monitor. This includes: ? Tablets. ? MP3 players. ? Cell phones.  While wearing your monitor you should avoid: ? Electric blankets. ? Armed forces operational officer. ? Electric toothbrushes. ? Microwave ovens. ? Magnets. ? Metal detectors. Get help right away if:  You have chest pain.  You have extreme difficulty breathing or shortness of breath.  You develop a very fast heartbeat that persists.  You develop dizziness that does not go away.  You faint or constantly feel like you are about to faint. Summary  A cardiac event monitor is a small recording device that is used to help detect abnormal heart rhythms (arrhythmias).  The monitor is used to record your heart rhythm when you have heart-related symptoms.  Make sure you understand how to send the information from the monitor to your health care provider.  It is important to press the button on the monitor when you have any heart-related symptoms.  Keep a diary of your activities, such as walking, doing chores, and taking medicine. It is very important to note what you were doing when you pushed the button to record your symptoms. This will help your health care provider learn what might be causing your symptoms. This information is not intended to replace advice given to you by your health care provider. Make sure you discuss any questions you have with your health care provider. Document Released: 04/12/2008 Document Revised: 06/18/2016 Document Reviewed: 06/18/2016 Elsevier Interactive Patient Education  2017 [place]. Echocardiogram An echocardiogram, or echocardiography, uses sound waves (ultrasound) to produce an image of your heart. The echocardiogram is simple, painless, obtained within a short  period of time, and offers valuable information to your health care provider. The images from an echocardiogram can provide information such as:  Evidence of coronary artery disease (CAD).  Heart size.  Heart muscle function.  Heart valve function.  Aneurysm detection.  Evidence of a past heart attack.  Fluid buildup around the heart.  Heart muscle thickening.  Assess heart valve function.  Tell a health care provider about:  Any allergies you have.  All medicines you are taking, including vitamins, herbs, eye drops, creams, and over-the-counter medicines.  Any problems you or family members have had with anesthetic medicines.  Any blood disorders you have.  Any surgeries you have had.  Any medical conditions you have.  Whether you are pregnant or may be pregnant. What happens before the procedure? No special preparation is needed. Eat and drink normally. What happens during the procedure?  In order to produce an image of your heart, gel will be applied to your chest and a wand-like tool (transducer) will be moved over your chest. The gel will help transmit the sound waves from the transducer. The sound waves will harmlessly bounce off your heart to allow the heart images to be captured in real-time motion. These images will then be recorded.  You may need an IV to receive  a medicine that improves the quality of the pictures. What happens after the procedure? You may return to your normal schedule including diet, activities, and medicines, unless your health care provider tells you otherwise. This information is not intended to replace advice given to you by your health care provider. Make sure you discuss any questions you have with your health care provider. Document Released: 07/01/2000 Document Revised: 02/20/2016 Document Reviewed: 03/11/2013 Elsevier Interactive Patient Education  2017 [place]: Your physician recommends that you schedule a  follow-up appointment in: 2 months   Any Other Special Instructions Will Be Listed Below (If Applicable).  Please note that any paperwork needing to be filled out by the provider will need to be addressed at the front desk prior to seeing the provider. Please note that any paperwork FMLA, Disability or other documents regarding health condition is subject to a $25.00 charge that must be received prior to completion of paperwork in the form of a money order or check.     If you need a refill on your cardiac medications before your next appointment, please call your pharmacy.

## 2017-05-15 ENCOUNTER — Other Ambulatory Visit: Payer: Self-pay | Admitting: Family Medicine

## 2017-05-17 DIAGNOSIS — S83281A Other tear of lateral meniscus, current injury, right knee, initial encounter: Secondary | ICD-10-CM | POA: Diagnosis not present

## 2017-05-17 DIAGNOSIS — M94261 Chondromalacia, right knee: Secondary | ICD-10-CM | POA: Diagnosis not present

## 2017-05-17 DIAGNOSIS — G8918 Other acute postprocedural pain: Secondary | ICD-10-CM | POA: Diagnosis not present

## 2017-05-17 DIAGNOSIS — X58XXXA Exposure to other specified factors, initial encounter: Secondary | ICD-10-CM | POA: Diagnosis not present

## 2017-05-17 DIAGNOSIS — Y999 Unspecified external cause status: Secondary | ICD-10-CM | POA: Diagnosis not present

## 2017-05-17 DIAGNOSIS — S83241A Other tear of medial meniscus, current injury, right knee, initial encounter: Secondary | ICD-10-CM | POA: Diagnosis not present

## 2017-05-25 DIAGNOSIS — S83241D Other tear of medial meniscus, current injury, right knee, subsequent encounter: Secondary | ICD-10-CM | POA: Diagnosis not present

## 2017-05-25 DIAGNOSIS — S83281D Other tear of lateral meniscus, current injury, right knee, subsequent encounter: Secondary | ICD-10-CM | POA: Diagnosis not present

## 2017-06-19 DIAGNOSIS — S83281D Other tear of lateral meniscus, current injury, right knee, subsequent encounter: Secondary | ICD-10-CM | POA: Diagnosis not present

## 2017-06-19 DIAGNOSIS — S83241D Other tear of medial meniscus, current injury, right knee, subsequent encounter: Secondary | ICD-10-CM | POA: Diagnosis not present

## 2017-07-04 ENCOUNTER — Ambulatory Visit: Payer: Medicare Other | Admitting: Cardiology

## 2017-07-19 ENCOUNTER — Ambulatory Visit: Payer: Medicare Other

## 2017-07-19 DIAGNOSIS — R002 Palpitations: Secondary | ICD-10-CM | POA: Diagnosis not present

## 2017-07-20 DIAGNOSIS — R002 Palpitations: Secondary | ICD-10-CM | POA: Diagnosis not present

## 2017-08-14 ENCOUNTER — Other Ambulatory Visit: Payer: Self-pay | Admitting: Family Medicine

## 2017-08-21 ENCOUNTER — Ambulatory Visit: Payer: Medicare Other | Admitting: Family Medicine

## 2017-09-05 ENCOUNTER — Ambulatory Visit: Payer: Medicare Other | Admitting: Cardiology

## 2017-09-12 ENCOUNTER — Ambulatory Visit: Payer: Medicare Other | Admitting: Family Medicine

## 2017-09-12 ENCOUNTER — Ambulatory Visit (INDEPENDENT_AMBULATORY_CARE_PROVIDER_SITE_OTHER): Payer: Medicare Other | Admitting: Cardiology

## 2017-09-12 ENCOUNTER — Encounter: Payer: Self-pay | Admitting: Cardiology

## 2017-09-12 VITALS — BP 126/66 | HR 88 | Ht 62.0 in | Wt 204.0 lb

## 2017-09-12 DIAGNOSIS — R002 Palpitations: Secondary | ICD-10-CM

## 2017-09-12 DIAGNOSIS — I10 Essential (primary) hypertension: Secondary | ICD-10-CM

## 2017-09-12 DIAGNOSIS — G4733 Obstructive sleep apnea (adult) (pediatric): Secondary | ICD-10-CM | POA: Diagnosis not present

## 2017-09-12 NOTE — Progress Notes (Signed)
Cardiology Office Note:    Date:  09/12/2017   ID:  Nakiah Osgood, DOB 1951/03/15, MRN [id number]  PCP:  [name], MD  Cardiologist:  Jenne Campus, MD    Referring MD: [name], MD   Chief Complaint  Patient presents with  . 2 month follow up  Doing well denies having a palpitations  History of Present Illness:    Denishia Citro is a 67 y.o. female with diagnosis of mitral valve prolapse, palpitations.  She went to knee surgery with no difficulties and very satisfied with results.  Became much more active and happy with it.  Denies having any palpitations recently.  She did wear Holter monitor which did not show any arrhythmia she did press button a few times for dizziness but during the time rhythm was normal.  Denies having chest pain tightness squeezing pressure burning chest.  Echocardiogram was done and analyzed with the patient.  Did not show any evidence of mitral valve prolapse I told her that that that indicates that mitral valve prolapse if she truly had one was very mild I also told her that we use different diagnostic regular rate now for mitral valve prolapse.  Overall I do not think we dealing with any serious problem.  Past Medical History:  Diagnosis Date  . Arthritis   . Baker's cyst of knee, right 02/16/2017  . Cancer ([place])    thyroid (2002)  . Cystic breast   . GERD (gastroesophageal reflux disease)   . Hepatic cyst 02/02/2015  . Hyperlipidemia   . Hypertension   . Insomnia 07/23/2016  . Menopause   . Migraine   . Palpitations   . Preventative health care 01/18/2016  . Sleep apnea 02/02/2015  . Thyroid disease     Past Surgical History:  Procedure Laterality Date  . ABDOMINAL HYSTERECTOMY     took right ovary and uterus  . APPENDECTOMY    . CARPAL TUNNEL RELEASE Right   . CESAREAN SECTION    . CHOLECYSTECTOMY    . EYE SURGERY Bilateral 2017   cataracts  . KNEE ARTHROPLASTY      Current Medications: Current Meds  Medication Sig  .  acetaminophen (TYLENOL) 325 MG tablet Take 650 mg by mouth every 6 (six) hours as needed.  Marland Kitchen albuterol (PROAIR HFA) 108 (90 Base) MCG/ACT inhaler Inhale 2 inhaltions TID prn  . b complex-C-folic acid 1 MG capsule Take 1 capsule by mouth daily.  . Cholecalciferol (CVS VIT D 5000 HIGH-POTENCY PO) Take 5,000 Units by mouth.  . fluticasone (FLONASE) 50 MCG/ACT nasal spray Place 2 sprays into both nostrils daily.  Marland Kitchen loratadine (CLARITIN) 10 MG tablet Take 10 mg by mouth daily.  Marland Kitchen OVER THE COUNTER MEDICATION Potassium  . SYNTHROID 150 MCG tablet TAKE 1 TABLET BY MOUTH EVERY DAY BEFORE BREAKFAST  . triamterene-hydrochlorothiazide (MAXZIDE) 75-50 MG tablet TAKE 1 TABLET BY MOUTH DAILY     Allergies:   Levaquin [levofloxacin]; Penicillins; and Codeine   Social History   Socioeconomic History  . Marital status: Divorced    Spouse name: None  . Number of children: None  . Years of education: None  . Highest education level: None  Social Needs  . Financial resource strain: None  . Food insecurity - worry: None  . Food insecurity - inability: None  . Transportation needs - medical: None  . Transportation needs - non-medical: None  Occupational History  . None  Tobacco Use  . Smoking status: Never Smoker  .  Smokeless tobacco: Never Used  Substance and Sexual Activity  . Alcohol use: No    Alcohol/week: 0.0 oz  . Drug use: No  . Sexual activity: No    Birth control/protection: Other-see comments, Surgical    Comment: Hysterectomy, lives with mother  Other Topics Concern  . None  Social History Narrative  . None     Family History: The patient's family history includes Arthritis in her father, mother, and sister; Asthma in her maternal grandfather; Breast cancer (age of onset: 55) in her sister; Cancer in her sister; Cancer (age of onset: 49) in her sister; Dementia in her paternal grandmother; Depression in her maternal grandfather; Diabetes in her father and paternal grandfather;  Fibromyalgia in her sister; GER disease in her sister; Gout in her sister; Heart disease in her father; Hyperlipidemia in her father and mother; Hypertension in her brother, father, and mother; Kidney disease in her father; Scoliosis in her daughter; Transient ischemic attack in her mother. ROS:   Please see the history of present illness.    All 14 point review of systems negative except as described per history of present illness  EKGs/Labs/Other Studies Reviewed:      Recent Labs: 02/16/2017: ALT 22; BUN 14; Creatinine, Ser 0.86; Hemoglobin 13.4; Platelets 322.0; Potassium 3.5; Sodium 140; TSH 0.20  Recent Lipid Panel    Component Value Date/Time   CHOL 176 02/16/2017 0905   TRIG 124.0 02/16/2017 0905   HDL 44.70 02/16/2017 0905   CHOLHDL 4 02/16/2017 0905   VLDL 24.8 02/16/2017 0905   LDLCALC 107 (H) 02/16/2017 0905    Physical Exam:    VS:  BP 126/66   Pulse 88   Ht 5\' 2"  (1.575 m)   Wt 204 lb (92.5 kg)   SpO2 96%   BMI 37.31 kg/m     Wt Readings from Last 3 Encounters:  09/12/17 204 lb (92.5 kg)  05/10/17 209 lb (94.8 kg)  02/16/17 208 lb 3.2 oz (94.4 kg)     GEN:  Well nourished, well developed in no acute distress HEENT: Normal NECK: No JVD; No carotid bruits LYMPHATICS: No lymphadenopathy CARDIAC: RRR, no murmurs, no rubs, no gallops RESPIRATORY:  Clear to auscultation without rales, wheezing or rhonchi  ABDOMEN: Soft, non-tender, non-distended MUSCULOSKELETAL:  No edema; No deformity  SKIN: Warm and dry LOWER EXTREMITIES: no swelling NEUROLOGIC:  Alert and oriented x 3 PSYCHIATRIC:  Normal affect   ASSESSMENT:    1. Essential hypertension   2. Palpitations   3. OSA (obstructive sleep apnea)    PLAN:    In order of problems listed above:  1. Essential hypertension: Blood pressure well controlled continue present management. 2. Palpitations: Denies having any. 3. Obstructive sleep apnea: Followed by internal medicine team. 4. Atrial valve  prolapse: Nothing on the echocardiogram.   Medication Adjustments/Labs and Tests Ordered: Current medicines are reviewed at length with the patient today.  Concerns regarding medicines are outlined above.  No orders of the defined types were placed in this encounter.  Medication changes: No orders of the defined types were placed in this encounter.   Signed, Park Liter, MD, [facility] 09/12/2017 3:17 PM    [place]

## 2017-09-12 NOTE — Patient Instructions (Signed)
Medication Instructions:  Your physician recommends that you continue on your current medications as directed. Please refer to the Current Medication list given to you today.  Labwork: None    Testing/Procedures: None   Follow-Up: Your physician wants you to follow-up in: 6 months. You will receive a reminder letter in the mail two months in advance. If you don't receive a letter, please call our office to schedule the follow-up appointment.  Any Other Special Instructions Will Be Listed Below (If Applicable).  Please note that any paperwork needing to be filled out by the provider will need to be addressed at the front desk prior to seeing the provider. Please note that any paperwork FMLA, Disability or other documents regarding health condition is subject to a $25.00 charge that must be received prior to completion of paperwork in the form of a money order or check.    If you need a refill on your cardiac medications before your next appointment, please call your pharmacy.  

## 2017-09-15 ENCOUNTER — Other Ambulatory Visit: Payer: Self-pay | Admitting: Family Medicine

## 2017-10-02 ENCOUNTER — Other Ambulatory Visit: Payer: Self-pay | Admitting: Family Medicine

## 2017-10-02 DIAGNOSIS — Z1231 Encounter for screening mammogram for malignant neoplasm of breast: Secondary | ICD-10-CM

## 2017-10-03 ENCOUNTER — Ambulatory Visit: Payer: Medicare Other | Admitting: Family Medicine

## 2017-10-16 ENCOUNTER — Other Ambulatory Visit: Payer: Self-pay | Admitting: Family Medicine

## 2017-10-16 ENCOUNTER — Ambulatory Visit: Payer: Medicare Other | Admitting: Family Medicine

## 2017-10-18 ENCOUNTER — Other Ambulatory Visit: Payer: Self-pay | Admitting: Family Medicine

## 2017-10-23 ENCOUNTER — Ambulatory Visit: Payer: Medicare Other

## 2017-11-15 ENCOUNTER — Other Ambulatory Visit: Payer: Self-pay | Admitting: Family Medicine

## 2017-11-18 ENCOUNTER — Other Ambulatory Visit: Payer: Self-pay | Admitting: Family Medicine

## 2017-11-20 ENCOUNTER — Other Ambulatory Visit (HOSPITAL_COMMUNITY)
Admission: RE | Admit: 2017-11-20 | Discharge: 2017-11-20 | Disposition: A | Discharge status: Home / Self Care | Payer: Medicare Other | Source: Ambulatory Visit | Attending: Family Medicine | Admitting: Family Medicine

## 2017-11-20 ENCOUNTER — Ambulatory Visit (INDEPENDENT_AMBULATORY_CARE_PROVIDER_SITE_OTHER): Payer: Medicare Other | Admitting: Family Medicine

## 2017-11-20 ENCOUNTER — Encounter: Payer: Self-pay | Admitting: Family Medicine

## 2017-11-20 VITALS — BP 130/78 | HR 87 | Temp 98.4°F | Resp 18 | Wt 207.0 lb

## 2017-11-20 DIAGNOSIS — E782 Mixed hyperlipidemia: Secondary | ICD-10-CM | POA: Diagnosis not present

## 2017-11-20 DIAGNOSIS — R739 Hyperglycemia, unspecified: Secondary | ICD-10-CM

## 2017-11-20 DIAGNOSIS — Z7251 High risk heterosexual behavior: Secondary | ICD-10-CM | POA: Diagnosis not present

## 2017-11-20 DIAGNOSIS — E890 Postprocedural hypothyroidism: Secondary | ICD-10-CM | POA: Diagnosis not present

## 2017-11-20 DIAGNOSIS — I10 Essential (primary) hypertension: Secondary | ICD-10-CM

## 2017-11-20 DIAGNOSIS — J309 Allergic rhinitis, unspecified: Secondary | ICD-10-CM

## 2017-11-20 MED ORDER — FLUTICASONE PROPIONATE 50 MCG/ACT NA SUSP: NASAL | 3 refills | Status: DC

## 2017-11-20 MED ORDER — TRIAMTERENE-HCTZ 75-50 MG PO TABS: 1.0000 | ORAL_TABLET | Freq: Every day | ORAL | 1 refills | Status: DC

## 2017-11-20 MED ORDER — MONTELUKAST SODIUM 10 MG PO TABS: 10.0000 mg | ORAL_TABLET | Freq: Every day | ORAL | 1 refills | Status: DC | PRN

## 2017-11-20 MED ORDER — SYNTHROID 150 MCG PO TABS: ORAL_TABLET | ORAL | 1 refills | Status: DC

## 2017-11-20 MED ORDER — HYOSCYAMINE SULFATE 0.125 MG SL SUBL: 1.0000 mg | SUBLINGUAL_TABLET | SUBLINGUAL | 1 refills | Status: DC | PRN

## 2017-11-20 NOTE — Assessment & Plan Note (Signed)
Encouraged heart healthy diet, increase exercise, avoid trans fats, consider a krill oil cap daily 

## 2017-11-20 NOTE — Assessment & Plan Note (Signed)
hgba1c acceptable, minimize simple carbs. Increase exercise as tolerated.  

## 2017-11-20 NOTE — Progress Notes (Signed)
Subjective:  I acted as a Education administrator for Dr. Charlett Blake. [place], [place]  Patient ID: Monica Wallace, female    DOB: March 06, 1951, 67 y.o.   MRN: [id number]  No chief complaint on file.   HPI  Patient is in today for 6 month follow up and overall she is doing well. No recent febrile illness or hospitalizations. Has a new boyfriend and is happy about this. No polyuria or polydipsia. Denies CP/palp/SOB/HA/fevers/GI or GU c/o. Taking meds as prescribed. Is noting increased head congestion and ear pressure  Patient Care Team: [name], MD as PCP - General (Family Medicine) Philemon Kingdom, MD as Consulting Physician (Internal Medicine) Juanita Craver, MD as Consulting Physician (Gastroenterology) [place], Mike Gip, MD as Consulting Physician (Pulmonary Disease) Juanita Craver, MD as Consulting Physician (Gastroenterology)   Past Medical History:  Diagnosis Date  . Arthritis   . Baker's cyst of knee, right 02/16/2017  . Cancer ([place])    thyroid (2002)  . Cystic breast   . GERD (gastroesophageal reflux disease)   . Hepatic cyst 02/02/2015  . Hyperlipidemia   . Hypertension   . Insomnia 07/23/2016  . Menopause   . Migraine   . Palpitations   . Preventative health care 01/18/2016  . Sleep apnea 02/02/2015  . Thyroid disease     Past Surgical History:  Procedure Laterality Date  . ABDOMINAL HYSTERECTOMY     took right ovary and uterus  . APPENDECTOMY    . CARPAL TUNNEL RELEASE Right   . CESAREAN SECTION    . CHOLECYSTECTOMY    . EYE SURGERY Bilateral 2017   cataracts  . KNEE ARTHROPLASTY      Family History  Problem Relation Age of Onset  . Arthritis Mother   . Transient ischemic attack Mother   . Hypertension Mother   . Hyperlipidemia Mother   . Diabetes Father   . Heart disease Father   . Arthritis Father   . Kidney disease Father   . Hypertension Father   . Hyperlipidemia Father   . Cancer Sister 11       pancreatic  . Fibromyalgia Sister   . Asthma Maternal Grandfather         smoker  . Depression Maternal Grandfather   . Dementia Paternal Grandmother   . Diabetes Paternal Grandfather   . Hypertension Brother   . Scoliosis Daughter        had rods placed and removed  . Arthritis Sister   . Gout Sister   . GER disease Sister   . Cancer Sister        breast cancer  . Breast cancer Sister 91    Social History   Socioeconomic History  . Marital status: Divorced    Spouse name: Not on file  . Number of children: Not on file  . Years of education: Not on file  . Highest education level: Not on file  Occupational History  . Not on file  Social Needs  . Financial resource strain: Not on file  . Food insecurity:    Worry: Not on file    Inability: Not on file  . Transportation needs:    Medical: Not on file    Non-medical: Not on file  Tobacco Use  . Smoking status: Never Smoker  . Smokeless tobacco: Never Used  Substance and Sexual Activity  . Alcohol use: No    Alcohol/week: 0.0 oz  . Drug use: No  . Sexual activity: Never  Birth control/protection: Other-see comments, Surgical    Comment: Hysterectomy, lives with mother  Lifestyle  . Physical activity:    Days per week: Not on file    Minutes per session: Not on file  . Stress: Not on file  Relationships  . Social connections:    Talks on phone: Not on file    Gets together: Not on file    Attends religious service: Not on file    Active member of club or organization: Not on file    Attends meetings of clubs or organizations: Not on file    Relationship status: Not on file  . Intimate partner violence:    Fear of current or ex partner: Not on file    Emotionally abused: Not on file    Physically abused: Not on file    Forced sexual activity: Not on file  Other Topics Concern  . Not on file  Social History Narrative  . Not on file    Outpatient Medications Prior to Visit  Medication Sig Dispense Refill  . acetaminophen (TYLENOL) 325 MG tablet Take 650 mg by mouth  every 6 (six) hours as needed.    Marland Kitchen albuterol (PROAIR HFA) 108 (90 Base) MCG/ACT inhaler Inhale 2 inhaltions TID prn 8.5 g 0  . b complex-C-folic acid 1 MG capsule Take 1 capsule by mouth daily.    . Cholecalciferol ([employer]S VIT D 5000 HIGH-POTENCY PO) Take 5,000 Units by mouth.    . loratadine (CLARITIN) 10 MG tablet Take 10 mg by mouth daily.    Marland Kitchen OVER THE COUNTER MEDICATION Potassium    . fluticasone (FLONASE) 50 MCG/ACT nasal spray SHAKE LIQUID AND USE 2 SPRAYS IN EACH NOSTRIL DAILY 16 g 0  . SYNTHROID 150 MCG tablet TAKE 1 TABLET BY MOUTH EVERY DAY BEFORE BREAKFAST 30 tablet 0  . triamterene-hydrochlorothiazide (MAXZIDE) 75-50 MG tablet TAKE 1 TABLET BY MOUTH DAILY 30 tablet 0  . triamterene-hydrochlorothiazide (MAXZIDE) 75-50 MG tablet TAKE 1 TABLET BY MOUTH DAILY 30 tablet 0   No facility-administered medications prior to visit.     Allergies  Allergen Reactions  . Levaquin [Levofloxacin] Shortness Of Breath  . Clindamycin/Lincomycin     Pruritus No rash  . Penicillins     Positive on allergy testing 'years ago.' Has never had penicillin. Has tolerated amoxicillin.  . Codeine Itching    Review of Systems  Constitutional: Negative for fever and malaise/fatigue.  HENT: Positive for congestion and ear pain. Negative for ear discharge and tinnitus.   Eyes: Negative for blurred vision.  Respiratory: Negative for shortness of breath.   Cardiovascular: Negative for chest pain, palpitations and leg swelling.  Gastrointestinal: Negative for abdominal pain, blood in stool and nausea.  Genitourinary: Negative for dysuria and frequency.  Musculoskeletal: Negative for falls.  Skin: Negative for rash.  Neurological: Negative for dizziness, loss of consciousness and headaches.  Endo/Heme/Allergies: Negative for environmental allergies.  Psychiatric/Behavioral: Negative for depression. The patient is not nervous/anxious.        Objective:    Physical Exam  Constitutional: She is  oriented to person, place, and time. She appears well-developed and well-nourished. No distress.  HENT:  Head: Normocephalic and atraumatic.  Nose: Nose normal.  Eyes: Right eye exhibits no discharge. Left eye exhibits no discharge.  Neck: Normal range of motion. Neck supple.  Cardiovascular: Normal rate and regular rhythm.  No murmur heard. Pulmonary/Chest: Effort normal and breath sounds normal.  Abdominal: Soft. Bowel sounds are normal. There is no tenderness.  Musculoskeletal: She exhibits no edema.  Neurological: She is alert and oriented to person, place, and time.  Skin: Skin is warm and dry.  Psychiatric: She has a normal mood and affect.  Nursing note and vitals reviewed.   BP 130/78 (BP Location: Left Arm, Patient Position: Sitting, Cuff Size: Normal)   Pulse 87   Temp 98.4 F (36.9 C) (Oral)   Resp 18   Wt 207 lb (93.9 kg)   SpO2 98%   BMI 37.86 kg/m  Wt Readings from Last 3 Encounters:  11/20/17 207 lb (93.9 kg)  09/12/17 204 lb (92.5 kg)  05/10/17 209 lb (94.8 kg)   BP Readings from Last 3 Encounters:  11/20/17 130/78  09/12/17 126/66  05/10/17 138/76     Immunization History  Administered Date(s) Administered  . Influenza-Unspecified 03/19/2015, 05/09/2016  . Pneumococcal Conjugate-13 01/18/2016  . Tdap 07/01/2014    Health Maintenance  Topic Date Due  . COLON CANCER SCREENING ANNUAL FOBT  07/02/2015  . PNA vac Low Risk Adult (2 of 2 - PPSV23) 01/17/2017  . INFLUENZA VACCINE  02/15/2018  . MAMMOGRAM  09/05/2018  . TETANUS/TDAP  07/01/2024  . COLONOSCOPY  06/07/2025  . DEXA SCAN  Completed  . Hepatitis C Screening  Completed    Lab Results  Component Value Date   WBC 9.0 02/16/2017   HGB 13.4 02/16/2017   HCT 40.6 02/16/2017   PLT 322.0 02/16/2017   GLUCOSE 89 02/16/2017   CHOL 176 02/16/2017   TRIG 124.0 02/16/2017   HDL 44.70 02/16/2017   LDLCALC 107 (H) 02/16/2017   ALT 22 02/16/2017   AST 23 02/16/2017   NA 140 02/16/2017   K 3.5  02/16/2017   CL 99 02/16/2017   CREATININE 0.86 02/16/2017   BUN 14 02/16/2017   CO2 32 02/16/2017   TSH 0.20 (L) 02/16/2017   HGBA1C 6.2 02/16/2017    Lab Results  Component Value Date   TSH 0.20 (L) 02/16/2017   Lab Results  Component Value Date   WBC 9.0 02/16/2017   HGB 13.4 02/16/2017   HCT 40.6 02/16/2017   MCV 90.4 02/16/2017   PLT 322.0 02/16/2017   Lab Results  Component Value Date   NA 140 02/16/2017   K 3.5 02/16/2017   CO2 32 02/16/2017   GLUCOSE 89 02/16/2017   BUN 14 02/16/2017   CREATININE 0.86 02/16/2017   BILITOT 0.4 02/16/2017   ALKPHOS 80 02/16/2017   AST 23 02/16/2017   ALT 22 02/16/2017   PROT 7.1 02/16/2017   ALBUMIN 3.8 02/16/2017   CALCIUM 9.3 02/16/2017   ANIONGAP 6 09/15/2014   GFR 84.39 02/16/2017   Lab Results  Component Value Date   CHOL 176 02/16/2017   Lab Results  Component Value Date   HDL 44.70 02/16/2017   Lab Results  Component Value Date   LDLCALC 107 (H) 02/16/2017   Lab Results  Component Value Date   TRIG 124.0 02/16/2017   Lab Results  Component Value Date   CHOLHDL 4 02/16/2017   Lab Results  Component Value Date   HGBA1C 6.2 02/16/2017         Assessment & Plan:   Problem List Items Addressed This Visit    Hyperlipidemia    Encouraged heart healthy diet, increase exercise, avoid trans fats, consider a krill oil cap daily      Relevant Medications   triamterene-hydrochlorothiazide (MAXZIDE) 75-50 MG tablet   Other Relevant Orders   Lipid panel   HTN (  hypertension)    Well controlled, no changes to meds. Encouraged heart healthy diet such as the DASH diet and exercise as tolerated.       Relevant Medications   triamterene-hydrochlorothiazide (MAXZIDE) 75-50 MG tablet   Other Relevant Orders   CBC   Comprehensive metabolic panel   TSH   Hypothyroidism    On Levothyroxine, continue to monitor      Relevant Medications   SYNTHROID 150 MCG tablet   Other Relevant Orders   TSH   T4,  free   Allergic rhinitis    inhaled corticosteroids struggling with seasonal allergies. Her ears feel plugged and itchy L>R, notes echoing in ears. Flonase and claritin, nasal saline. Singulair/Montelukast 10 mg  Daily prn      Hyperglycemia    hgba1c acceptable, minimize simple carbs. Increase exercise as tolerated.      Relevant Orders   Hemoglobin A1c   High risk heterosexual behavior - Primary    Has a new partner. Will proceed with testing      Relevant Orders   HIV antibody   RPR   Urine cytology ancillary only      I am having Monica Wallace start on montelukast and hyoscyamine. I am also having her maintain her b complex-C-folic acid, loratadine, Cholecalciferol ([employer]S VIT D 5000 HIGH-POTENCY PO), OVER THE COUNTER MEDICATION, acetaminophen, albuterol, triamterene-hydrochlorothiazide, fluticasone, and SYNTHROID.  Meds ordered this encounter  Medications  . triamterene-hydrochlorothiazide (MAXZIDE) 75-50 MG tablet    Sig: Take 1 tablet by mouth daily.    Dispense:  90 tablet    Refill:  1  . fluticasone (FLONASE) 50 MCG/ACT nasal spray    Sig: SHAKE LIQUID AND USE 2 SPRAYS IN EACH NOSTRIL DAILY    Dispense:  16 g    Refill:  3  . SYNTHROID 150 MCG tablet    Sig: TAKE 1 TABLET BY MOUTH EVERY DAY BEFORE BREAKFAST    Dispense:  90 tablet    Refill:  1  . montelukast (SINGULAIR) 10 MG tablet    Sig: Take 1 tablet (10 mg total) by mouth daily as needed.    Dispense:  90 tablet    Refill:  1  . hyoscyamine (LEVSIN SL) 0.125 MG SL tablet    Sig: Place 8 tablets (1 mg total) under the tongue every 4 (four) hours as needed.    Dispense:  30 tablet    Refill:  1    CMA served as scribe during this visit. History, Physical and Plan performed by medical provider. Documentation and orders reviewed and attested to.  [name], MD

## 2017-11-20 NOTE — Patient Instructions (Signed)
Cholesterol Cholesterol is a fat. Your body needs a small amount of cholesterol. Cholesterol (plaque) may build up in your blood vessels (arteries). That makes you more likely to have a heart attack or stroke. You cannot feel your cholesterol level. Having a blood test is the only way to find out if your level is high. Keep your test results. Work with your doctor to keep your cholesterol at a good level. What do the results mean?  Total cholesterol is how much cholesterol is in your blood.  LDL is bad cholesterol. This is the type that can build up. Try to have low LDL.  HDL is good cholesterol. It cleans your blood vessels and carries LDL away. Try to have high HDL.  Triglycerides are fat that the body can store or burn for energy. What are good levels of cholesterol?  Total cholesterol below 200.  LDL below 100 is good for people who have health risks. LDL below 70 is good for people who have very high risks.  HDL above 40 is good. It is best to have HDL of 60 or higher.  Triglycerides below 150. How can I lower my cholesterol? Diet Follow your diet program as told by your doctor.  Choose fish, white meat chicken, or turkey that is roasted or baked. Try not to eat red meat, fried foods, sausage, or lunch meats.  Eat lots of fresh fruits and vegetables.  Choose whole grains, beans, pasta, potatoes, and cereals.  Choose olive oil, corn oil, or canola oil. Only use small amounts.  Try not to eat butter, mayonnaise, shortening, or palm kernel oils.  Try not to eat foods with trans fats.  Choose low-fat or nonfat dairy foods. ? Drink skim or nonfat milk. ? Eat low-fat or nonfat yogurt and cheeses. ? Try not to drink whole milk or cream. ? Try not to eat ice cream, egg yolks, or full-fat cheeses.  Healthy desserts include angel food cake, ginger snaps, animal crackers, hard candy, popsicles, and low-fat or nonfat frozen yogurt. Try not to eat pastries, cakes, pies, and  cookies.  Exercise Follow your exercise program as told by your doctor.  Be more active. Try gardening, walking, and taking the stairs.  Ask your doctor about ways that you can be more active.  Medicine  Take over-the-counter and prescription medicines only as told by your doctor. This information is not intended to replace advice given to you by your health care provider. Make sure you discuss any questions you have with your health care provider. Document Released: 09/30/2008 Document Revised: 02/03/2016 Document Reviewed: 01/14/2016 Elsevier Interactive Patient Education  2018 Elsevier Inc.  

## 2017-11-20 NOTE — Assessment & Plan Note (Signed)
Well controlled, no changes to meds. Encouraged heart healthy diet such as the DASH diet and exercise as tolerated.  °

## 2017-11-20 NOTE — Assessment & Plan Note (Signed)
inhaled corticosteroids struggling with seasonal allergies. Her ears feel plugged and itchy L>R, notes echoing in ears. Flonase and claritin, nasal saline. Singulair/Montelukast 10 mg  Daily prn

## 2017-11-20 NOTE — Assessment & Plan Note (Signed)
On Levothyroxine, continue to monitor 

## 2017-11-20 NOTE — Assessment & Plan Note (Signed)
Has a new partner. Will proceed with testing

## 2017-11-21 LAB — LIPID PANEL
Cholesterol: 206 mg/dL — ABNORMAL HIGH (ref 0–200)
HDL: 49.7 mg/dL (ref >39.00)
NonHDL: 156.04
Total CHOL/HDL Ratio: 4
Triglycerides: 208 mg/dL — ABNORMAL HIGH (ref 0.0–149.0)
VLDL: 41.6 mg/dL — ABNORMAL HIGH (ref 0.0–40.0)

## 2017-11-21 LAB — RPR: RPR Ser Ql: NONREACTIVE

## 2017-11-21 LAB — CBC
HCT: 40.8 % (ref 36.0–46.0)
Hemoglobin: 13.5 g/dL (ref 12.0–15.0)
MCHC: 33.1 g/dL (ref 30.0–36.0)
MCV: 89.7 fl (ref 78.0–100.0)
Platelets: 316 10*3/uL (ref 150.0–400.0)
RBC: 4.55 Mil/uL (ref 3.87–5.11)
RDW: 13.7 % (ref 11.5–15.5)
WBC: 10.2 10*3/uL (ref 4.0–10.5)

## 2017-11-21 LAB — COMPREHENSIVE METABOLIC PANEL
ALT: 21 U/L (ref 0–35)
AST: 25 U/L (ref 0–37)
Albumin: 3.8 g/dL (ref 3.5–5.2)
Alkaline Phosphatase: 82 U/L (ref 39–117)
BUN: 13 mg/dL (ref 6–23)
CO2: 30 mEq/L (ref 19–32)
Calcium: 9.4 mg/dL (ref 8.4–10.5)
Chloride: 99 mEq/L (ref 96–112)
Creatinine, Ser: 0.9 mg/dL (ref 0.40–1.20)
GFR: 79.9 mL/min (ref >60.00)
Glucose, Bld: 88 mg/dL (ref 70–99)
Potassium: 3.5 mEq/L (ref 3.5–5.1)
Sodium: 138 mEq/L (ref 135–145)
Total Bilirubin: 0.2 mg/dL (ref 0.2–1.2)
Total Protein: 7 g/dL (ref 6.0–8.3)

## 2017-11-21 LAB — LDL CHOLESTEROL, DIRECT: Direct LDL: 129 mg/dL

## 2017-11-21 LAB — HIV ANTIBODY (ROUTINE TESTING W REFLEX): HIV 1&2 Ab, 4th Generation: NONREACTIVE

## 2017-11-21 LAB — HEMOGLOBIN A1C: Hgb A1c MFr Bld: 6.2 % (ref 4.6–6.5)

## 2017-11-21 LAB — T4, FREE: Free T4: 1.07 ng/dL (ref 0.60–1.60)

## 2017-11-21 LAB — TSH: TSH: 0.1 u[IU]/mL — ABNORMAL LOW (ref 0.35–4.50)

## 2017-11-22 LAB — URINE CYTOLOGY ANCILLARY ONLY
Chlamydia: NEGATIVE
Neisseria Gonorrhea: NEGATIVE
Trichomonas: NEGATIVE

## 2017-11-24 LAB — URINE CYTOLOGY ANCILLARY ONLY
Bacterial vaginitis: NEGATIVE
Candida vaginitis: NEGATIVE

## 2017-11-27 ENCOUNTER — Ambulatory Visit
Admission: RE | Admit: 2017-11-27 | Discharge: 2017-11-27 | Disposition: A | Discharge status: Home / Self Care | Payer: Medicare Other | Source: Ambulatory Visit | Attending: Family Medicine | Admitting: Family Medicine

## 2017-11-27 DIAGNOSIS — Z1231 Encounter for screening mammogram for malignant neoplasm of breast: Secondary | ICD-10-CM

## 2018-02-15 ENCOUNTER — Other Ambulatory Visit: Payer: Self-pay

## 2018-03-20 ENCOUNTER — Other Ambulatory Visit: Payer: Self-pay | Admitting: Family Medicine

## 2018-04-09 ENCOUNTER — Ambulatory Visit (HOSPITAL_BASED_OUTPATIENT_CLINIC_OR_DEPARTMENT_OTHER)
Admission: RE | Admit: 2018-04-09 | Discharge: 2018-04-09 | Disposition: A | Discharge status: Home / Self Care | Payer: Medicare Other | Source: Ambulatory Visit | Attending: Medical | Admitting: Medical

## 2018-04-09 ENCOUNTER — Ambulatory Visit (INDEPENDENT_AMBULATORY_CARE_PROVIDER_SITE_OTHER): Payer: Medicare Other | Admitting: Medical

## 2018-04-09 ENCOUNTER — Encounter: Payer: Self-pay | Admitting: Medical

## 2018-04-09 VITALS — BP 139/93 | HR 69 | Temp 98.0°F | Resp 16 | Ht 62.0 in | Wt 210.4 lb

## 2018-04-09 DIAGNOSIS — M7661 Achilles tendinitis, right leg: Secondary | ICD-10-CM | POA: Diagnosis not present

## 2018-04-09 DIAGNOSIS — M79671 Pain in right foot: Secondary | ICD-10-CM

## 2018-04-09 DIAGNOSIS — M722 Plantar fascial fibromatosis: Secondary | ICD-10-CM | POA: Diagnosis not present

## 2018-04-09 MED ORDER — KETOROLAC TROMETHAMINE 60 MG/2ML IM SOLN
60.0000 mg | Freq: Once | INTRAMUSCULAR | Status: AC
  Administered 2018-04-09: 60 mg via INTRAMUSCULAR

## 2018-04-09 NOTE — Progress Notes (Signed)
Subjective:    Patient ID: Monica Wallace, female    DOB: [birth year], 67 y.o.   MRN: [id number]  HPI  Pt in with some rt side heel pain.Pt states hx of plantar fascitis in the past. Also she states has known heel spur. Pt states has heel inserts and she wears she has soft shoe.   Over last week got worse. No trauma or fall.  It has been a long time since she got her last xray. Years ago saw podiatrist.  Pt explains during physical exam pain is more in achilles tendon area and medial heel.  Pt states nsaids will bother her stomach if she take more than 2 days in a row. But no ulcers. Pt has good kidney function. She states max does she can tolerate even for a few days is ibuprofen 400 mg.   Review of Systems  Constitutional: Negative for chills and fatigue.  Respiratory: Negative for cough, chest tightness, shortness of breath and wheezing.   Cardiovascular: Negative for chest pain and palpitations.  Gastrointestinal: Negative for abdominal pain.  Musculoskeletal:       Rt foot and achilles tendon pain.  Skin: Negative for rash.  Neurological: Negative for dizziness, syncope, weakness and headaches.  Hematological: Negative for adenopathy. Does not bruise/bleed easily.  Psychiatric/Behavioral: Negative for behavioral problems and confusion. The patient is not nervous/anxious.    Past Medical History:  Diagnosis Date  . Arthritis   . Baker's cyst of knee, right 02/16/2017  . Cancer ([place])    thyroid (2002)  . Cystic breast   . GERD (gastroesophageal reflux disease)   . Hepatic cyst 02/02/2015  . Hyperlipidemia   . Hypertension   . Insomnia 07/23/2016  . Menopause   . Migraine   . Palpitations   . Preventative health care 01/18/2016  . Sleep apnea 02/02/2015  . Thyroid disease      Social History   Socioeconomic History  . Marital status: Divorced    Spouse name: Not on file  . Number of children: Not on file  . Years of education: Not on file  . Highest education level: Not  on file  Occupational History  . Not on file  Social Needs  . Financial resource strain: Not on file  . Food insecurity:    Worry: Not on file    Inability: Not on file  . Transportation needs:    Medical: Not on file    Non-medical: Not on file  Tobacco Use  . Smoking status: Never Smoker  . Smokeless tobacco: Never Used  Substance and Sexual Activity  . Alcohol use: No    Alcohol/week: 0.0 standard drinks  . Drug use: No  . Sexual activity: Never    Birth control/protection: Other-see comments, Surgical    Comment: Hysterectomy, lives with mother  Lifestyle  . Physical activity:    Days per week: Not on file    Minutes per session: Not on file  . Stress: Not on file  Relationships  . Social connections:    Talks on phone: Not on file    Gets together: Not on file    Attends religious service: Not on file    Active member of club or organization: Not on file    Attends meetings of clubs or organizations: Not on file    Relationship status: Not on file  . Intimate partner violence:    Fear of current or ex partner: Not on file    Emotionally abused: Not on  file    Physically abused: Not on file    Forced sexual activity: Not on file  Other Topics Concern  . Not on file  Social History Narrative  . Not on file    Past Surgical History:  Procedure Laterality Date  . ABDOMINAL HYSTERECTOMY     took right ovary and uterus  . APPENDECTOMY    . CARPAL TUNNEL RELEASE Right   . CESAREAN SECTION    . CHOLECYSTECTOMY    . EYE SURGERY Bilateral 2017   cataracts  . KNEE ARTHROPLASTY      Family History  Problem Relation Age of Onset  . Arthritis Mother   . Transient ischemic attack Mother   . Hypertension Mother   . Hyperlipidemia Mother   . Diabetes Father   . Heart disease Father   . Arthritis Father   . Kidney disease Father   . Hypertension Father   . Hyperlipidemia Father   . Cancer Sister 44       pancreatic  . Fibromyalgia Sister   . Asthma Maternal  Grandfather        smoker  . Depression Maternal Grandfather   . Dementia Paternal Grandmother   . Diabetes Paternal Grandfather   . Hypertension Brother   . Scoliosis Daughter        had rods placed and removed  . Arthritis Sister   . Gout Sister   . GER disease Sister   . Cancer Sister        breast cancer  . Breast cancer Sister 49    Allergies  Allergen Reactions  . Levaquin [Levofloxacin] Shortness Of Breath  . Clindamycin/Lincomycin     Pruritus No rash  . Penicillins     Positive on allergy testing 'years ago.' Has never had penicillin. Has tolerated amoxicillin.  . Codeine Itching    Current Outpatient Medications on File Prior to Visit  Medication Sig Dispense Refill  . acetaminophen (TYLENOL) 325 MG tablet Take 650 mg by mouth every 6 (six) hours as needed.    Marland Kitchen albuterol (PROAIR HFA) 108 (90 Base) MCG/ACT inhaler Inhale 2 inhaltions TID prn 8.5 g 0  . b complex-C-folic acid 1 MG capsule Take 1 capsule by mouth daily.    . Cholecalciferol ([employer]S VIT D 5000 HIGH-POTENCY PO) Take 5,000 Units by mouth.    . fluticasone (FLONASE) 50 MCG/ACT nasal spray SHAKE LIQUID AND USE 2 SPRAYS IN EACH NOSTRIL DAILY 16 g 3  . hyoscyamine (LEVSIN SL) 0.125 MG SL tablet PLACE 8 TABLETS UNDER THE TONGUE EVERY 4 HOURS AS NEEDED 30 tablet 0  . loratadine (CLARITIN) 10 MG tablet Take 10 mg by mouth daily.    . montelukast (SINGULAIR) 10 MG tablet Take 1 tablet (10 mg total) by mouth daily as needed. 90 tablet 1  . OVER THE COUNTER MEDICATION Potassium    . SYNTHROID 150 MCG tablet TAKE 1 TABLET BY MOUTH EVERY DAY BEFORE BREAKFAST 90 tablet 1  . triamterene-hydrochlorothiazide (MAXZIDE) 75-50 MG tablet Take 1 tablet by mouth daily. 90 tablet 1   No current facility-administered medications on file prior to visit.     BP (!) 139/93   Pulse 69   Temp 98 F (36.7 C) (Oral)   Resp 16   Ht 5\' 2"  (1.575 m)   Wt 210 lb 6.4 oz (95.4 kg)   SpO2 99%   BMI 38.48 kg/m       Objective:    Physical Exam  General- No acute distress.  Pleasant patient.  Lungs- Clear, even and unlabored. Heart- regular rate and rhythm. Neurologic- CNII- XII grossly intact.  Rt foot- medial aspect heel pain on palpation. Achilles tendon tender to palpation. Today no direct pain on palpation of heel.       Assessment & Plan:  For your recent right foot pain and Achilles tendinitis type pain, we gave you Toradol 60 mg IM injection.  Starting tomorrow afternoon you could use low-dose ibuprofen 200 mg along with 1 Tylenol twice daily.  I think this would prevent you from having upset stomach as you report history of intolerance to NSAIDs.  This regimen might be adequate to help with pain and inflammation.  Due to the severity of your reported pain, I decided to go ahead and refer you to sports medicine.  I think they will call you relatively soon by the end of this week or early next week.  Follow-up in 7 to 10 days or as needed.  Mackie Pai, PA-C

## 2018-04-09 NOTE — Patient Instructions (Signed)
For your recent right foot pain and Achilles tendinitis type pain, we gave you Toradol 60 mg IM injection.  Starting tomorrow afternoon you could use low-dose ibuprofen 200 mg along with 1 Tylenol twice daily.  I think this would prevent you from having upset stomach as you report history of intolerance to NSAIDs.  This regimen might be adequate to help with pain and inflammation.  Due to the severity of your reported pain, I decided to go ahead and refer you to sports medicine.  I think they will call you relatively soon by the end of this week or early next week.  Follow-up in 7 to 10 days or as needed.

## 2018-04-16 ENCOUNTER — Encounter: Payer: Self-pay | Admitting: Family Medicine

## 2018-04-16 ENCOUNTER — Ambulatory Visit (INDEPENDENT_AMBULATORY_CARE_PROVIDER_SITE_OTHER): Payer: Medicare Other | Admitting: Family Medicine

## 2018-04-16 VITALS — BP 152/87 | HR 76 | Ht 62.0 in | Wt 207.0 lb

## 2018-04-16 DIAGNOSIS — M25571 Pain in right ankle and joints of right foot: Secondary | ICD-10-CM | POA: Diagnosis not present

## 2018-04-16 MED ORDER — DICLOFENAC SODIUM 1 % TD GEL: 2.0000 g | Freq: Four times a day (QID) | TRANSDERMAL | 1 refills | Status: DC

## 2018-04-16 MED ORDER — NITROGLYCERIN 0.2 MG/HR TD PT24: MEDICATED_PATCH | TRANSDERMAL | 1 refills | Status: DC

## 2018-04-16 NOTE — Progress Notes (Signed)
PCP: [name], MD  Subjective:   HPI: Patient is a 67 y.o. female here for right medial ankle pain x2 weeks. Pt reports 6/10 sharp medial and posterior pain worse with standing. She gets relief with rest. She denies any specific injury. She does report h/o plantar fasciitis in the past and has been doing those exercises for her feet. She doesn't report plantar pain at this time. She took ibuprofen and tylenol. She also uses biofreeze, arnaca gel and aspercream. She wears orthotics that she got form her podiatrist a few years ago. She denies any swelling, erythema or brusing. No numbness or tingling. No skin changes.  Past Medical History:  Diagnosis Date  . Arthritis   . Baker's cyst of knee, right 02/16/2017  . Cancer ([place])    thyroid (2002)  . Cystic breast   . GERD (gastroesophageal reflux disease)   . Hepatic cyst 02/02/2015  . Hyperlipidemia   . Hypertension   . Insomnia 07/23/2016  . Menopause   . Migraine   . Palpitations   . Preventative health care 01/18/2016  . Sleep apnea 02/02/2015  . Thyroid disease     Current Outpatient Medications on File Prior to Visit  Medication Sig Dispense Refill  . acetaminophen (TYLENOL) 325 MG tablet Take 650 mg by mouth every 6 (six) hours as needed.    Marland Kitchen albuterol (PROAIR HFA) 108 (90 Base) MCG/ACT inhaler Inhale 2 inhaltions TID prn 8.5 g 0  . b complex-C-folic acid 1 MG capsule Take 1 capsule by mouth daily.    . Cholecalciferol ([employer] VIT D 5000 HIGH-POTENCY PO) Take 5,000 Units by mouth.    . fluticasone (FLONASE) 50 MCG/ACT nasal spray SHAKE LIQUID AND USE 2 SPRAYS IN EACH NOSTRIL DAILY 16 g 3  . hyoscyamine (LEVSIN SL) 0.125 MG SL tablet PLACE 8 TABLETS UNDER THE TONGUE EVERY 4 HOURS AS NEEDED 30 tablet 0  . loratadine (CLARITIN) 10 MG tablet Take 10 mg by mouth daily.    . montelukast (SINGULAIR) 10 MG tablet Take 1 tablet (10 mg total) by mouth daily as needed. 90 tablet 1  . OVER THE COUNTER MEDICATION Potassium    . SYNTHROID  150 MCG tablet TAKE 1 TABLET BY MOUTH EVERY DAY BEFORE BREAKFAST 90 tablet 1  . triamterene-hydrochlorothiazide (MAXZIDE) 75-50 MG tablet Take 1 tablet by mouth daily. 90 tablet 1   No current facility-administered medications on file prior to visit.     Past Surgical History:  Procedure Laterality Date  . ABDOMINAL HYSTERECTOMY     took right ovary and uterus  . APPENDECTOMY    . CARPAL TUNNEL RELEASE Right   . CESAREAN SECTION    . CHOLECYSTECTOMY    . EYE SURGERY Bilateral 2017   cataracts  . KNEE ARTHROPLASTY      Allergies  Allergen Reactions  . Levaquin [Levofloxacin] Shortness Of Breath  . Clindamycin/Lincomycin     Pruritus No rash  . Penicillins     Positive on allergy testing 'years ago.' Has never had penicillin. Has tolerated amoxicillin.  . Codeine Itching    Social History   Socioeconomic History  . Marital status: Divorced    Spouse name: Not on file  . Number of children: Not on file  . Years of education: Not on file  . Highest education level: Not on file  Occupational History  . Not on file  Social Needs  . Financial resource strain: Not on file  . Food insecurity:    Worry:  Not on file    Inability: Not on file  . Transportation needs:    Medical: Not on file    Non-medical: Not on file  Tobacco Use  . Smoking status: Never Smoker  . Smokeless tobacco: Never Used  Substance and Sexual Activity  . Alcohol use: No    Alcohol/week: 0.0 standard drinks  . Drug use: No  . Sexual activity: Never    Birth control/protection: Other-see comments, Surgical    Comment: Hysterectomy, lives with mother  Lifestyle  . Physical activity:    Days per week: Not on file    Minutes per session: Not on file  . Stress: Not on file  Relationships  . Social connections:    Talks on phone: Not on file    Gets together: Not on file    Attends religious service: Not on file    Active member of club or organization: Not on file    Attends meetings of clubs  or organizations: Not on file    Relationship status: Not on file  . Intimate partner violence:    Fear of current or ex partner: Not on file    Emotionally abused: Not on file    Physically abused: Not on file    Forced sexual activity: Not on file  Other Topics Concern  . Not on file  Social History Narrative  . Not on file    Family History  Problem Relation Age of Onset  . Arthritis Mother   . Transient ischemic attack Mother   . Hypertension Mother   . Hyperlipidemia Mother   . Diabetes Father   . Heart disease Father   . Arthritis Father   . Kidney disease Father   . Hypertension Father   . Hyperlipidemia Father   . Cancer Sister 20       pancreatic  . Fibromyalgia Sister   . Asthma Maternal Grandfather        smoker  . Depression Maternal Grandfather   . Dementia Paternal Grandmother   . Diabetes Paternal Grandfather   . Hypertension Brother   . Scoliosis Daughter        had rods placed and removed  . Arthritis Sister   . Gout Sister   . GER disease Sister   . Cancer Sister        breast cancer  . Breast cancer Sister 69    BP (!) 152/87   Pulse 76   Ht 5\' 2"  (1.575 m)   Wt 207 lb (93.9 kg)   BMI 37.86 kg/m   Review of Systems: See HPI above.     Objective:  Physical Exam:  Gen: awake, alert, NAD, comfortable in exam room Pulm: breathing unlabored  Right Foot: Inspection:  No obvious bony deformity.  No swelling, erythema, or bruising.  Normal arch Palpation: TTP posterior to the medial malleolus over the post tib tendon. Tenderness over the achilles tendon insertion. No bony TTP ROM: Full ROM of the ankle.  Strength: 5/5 strength ankle in all planes Neurovascular: N/V intact distally in the lower extremity Special tests: Negative anterior drawer.  Left Foot: Inspection:  No obvious bony deformity.  No swelling, erythema, or bruising.  Normal arch Palpation: No tenderness to palpation ROM: Full ROM Strength: 5/5 strength  Neurovascular:  N/V intact distally in the lower extremity   Assessment & Plan:  1.  Right medial ankle pain-secondary to mild insertional Achilles tendinitis and tibialis posterior tendinitis. - Continue to wear orthotics - We  will refer to physical therapy; reviewed home exercises to start today. - Will prescribe Voltaren gel - Nitroglycerin patches - Follow-up 6 weeks

## 2018-04-16 NOTE — Patient Instructions (Signed)
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 Voltaren gel up to 4 times a day for pain and inflammation. Nitro patches 1/4th patch to affected area, change daily. Calf raises 3 sets of 10 on level ground once a day first. When these are easy, can do them one legged 3 sets of 10. Finally advance to doing them on a step. Do theraband strengthening as well 3 sets of 10 once a day. Can add heel walks, toe walks forward and backward as well Start physical therapy and do the home exercises on days you don't go to therapy. Ice bucket 10-15 minutes at end of day - can ice 3-4 times a day. Avoid uneven ground, hills as much as possible. Arch supports are important. Follow up in 6 weeks.

## 2018-04-24 DIAGNOSIS — M7661 Achilles tendinitis, right leg: Secondary | ICD-10-CM | POA: Diagnosis not present

## 2018-04-26 DIAGNOSIS — M7661 Achilles tendinitis, right leg: Secondary | ICD-10-CM | POA: Diagnosis not present

## 2018-04-26 DIAGNOSIS — M25571 Pain in right ankle and joints of right foot: Secondary | ICD-10-CM | POA: Diagnosis not present

## 2018-04-26 DIAGNOSIS — M25671 Stiffness of right ankle, not elsewhere classified: Secondary | ICD-10-CM | POA: Diagnosis not present

## 2018-04-26 DIAGNOSIS — M25471 Effusion, right ankle: Secondary | ICD-10-CM | POA: Diagnosis not present

## 2018-04-27 DIAGNOSIS — M25671 Stiffness of right ankle, not elsewhere classified: Secondary | ICD-10-CM | POA: Diagnosis not present

## 2018-04-27 DIAGNOSIS — M25571 Pain in right ankle and joints of right foot: Secondary | ICD-10-CM | POA: Diagnosis not present

## 2018-04-27 DIAGNOSIS — M25471 Effusion, right ankle: Secondary | ICD-10-CM | POA: Diagnosis not present

## 2018-04-27 DIAGNOSIS — M7661 Achilles tendinitis, right leg: Secondary | ICD-10-CM | POA: Diagnosis not present

## 2018-05-01 DIAGNOSIS — M25571 Pain in right ankle and joints of right foot: Secondary | ICD-10-CM | POA: Diagnosis not present

## 2018-05-01 DIAGNOSIS — M7661 Achilles tendinitis, right leg: Secondary | ICD-10-CM | POA: Diagnosis not present

## 2018-05-01 DIAGNOSIS — M25471 Effusion, right ankle: Secondary | ICD-10-CM | POA: Diagnosis not present

## 2018-05-01 DIAGNOSIS — M25671 Stiffness of right ankle, not elsewhere classified: Secondary | ICD-10-CM | POA: Diagnosis not present

## 2018-05-03 DIAGNOSIS — M25571 Pain in right ankle and joints of right foot: Secondary | ICD-10-CM | POA: Diagnosis not present

## 2018-05-03 DIAGNOSIS — M25671 Stiffness of right ankle, not elsewhere classified: Secondary | ICD-10-CM | POA: Diagnosis not present

## 2018-05-03 DIAGNOSIS — M7661 Achilles tendinitis, right leg: Secondary | ICD-10-CM | POA: Diagnosis not present

## 2018-05-03 DIAGNOSIS — M25471 Effusion, right ankle: Secondary | ICD-10-CM | POA: Diagnosis not present

## 2018-05-04 DIAGNOSIS — M25671 Stiffness of right ankle, not elsewhere classified: Secondary | ICD-10-CM | POA: Diagnosis not present

## 2018-05-04 DIAGNOSIS — M7661 Achilles tendinitis, right leg: Secondary | ICD-10-CM | POA: Diagnosis not present

## 2018-05-04 DIAGNOSIS — M25471 Effusion, right ankle: Secondary | ICD-10-CM | POA: Diagnosis not present

## 2018-05-04 DIAGNOSIS — M25571 Pain in right ankle and joints of right foot: Secondary | ICD-10-CM | POA: Diagnosis not present

## 2018-05-08 DIAGNOSIS — M25671 Stiffness of right ankle, not elsewhere classified: Secondary | ICD-10-CM | POA: Diagnosis not present

## 2018-05-08 DIAGNOSIS — M7661 Achilles tendinitis, right leg: Secondary | ICD-10-CM | POA: Diagnosis not present

## 2018-05-08 DIAGNOSIS — M25571 Pain in right ankle and joints of right foot: Secondary | ICD-10-CM | POA: Diagnosis not present

## 2018-05-08 DIAGNOSIS — M25471 Effusion, right ankle: Secondary | ICD-10-CM | POA: Diagnosis not present

## 2018-05-10 DIAGNOSIS — M25671 Stiffness of right ankle, not elsewhere classified: Secondary | ICD-10-CM | POA: Diagnosis not present

## 2018-05-10 DIAGNOSIS — M7661 Achilles tendinitis, right leg: Secondary | ICD-10-CM | POA: Diagnosis not present

## 2018-05-10 DIAGNOSIS — M25471 Effusion, right ankle: Secondary | ICD-10-CM | POA: Diagnosis not present

## 2018-05-10 DIAGNOSIS — M25571 Pain in right ankle and joints of right foot: Secondary | ICD-10-CM | POA: Diagnosis not present

## 2018-05-15 DIAGNOSIS — M25671 Stiffness of right ankle, not elsewhere classified: Secondary | ICD-10-CM | POA: Diagnosis not present

## 2018-05-15 DIAGNOSIS — M25571 Pain in right ankle and joints of right foot: Secondary | ICD-10-CM | POA: Diagnosis not present

## 2018-05-15 DIAGNOSIS — M25471 Effusion, right ankle: Secondary | ICD-10-CM | POA: Diagnosis not present

## 2018-05-15 DIAGNOSIS — M7661 Achilles tendinitis, right leg: Secondary | ICD-10-CM | POA: Diagnosis not present

## 2018-05-17 DIAGNOSIS — M7661 Achilles tendinitis, right leg: Secondary | ICD-10-CM | POA: Diagnosis not present

## 2018-05-17 DIAGNOSIS — M25571 Pain in right ankle and joints of right foot: Secondary | ICD-10-CM | POA: Diagnosis not present

## 2018-05-17 DIAGNOSIS — M25471 Effusion, right ankle: Secondary | ICD-10-CM | POA: Diagnosis not present

## 2018-05-17 DIAGNOSIS — M25671 Stiffness of right ankle, not elsewhere classified: Secondary | ICD-10-CM | POA: Diagnosis not present

## 2018-05-22 DIAGNOSIS — M25471 Effusion, right ankle: Secondary | ICD-10-CM | POA: Diagnosis not present

## 2018-05-22 DIAGNOSIS — M7661 Achilles tendinitis, right leg: Secondary | ICD-10-CM | POA: Diagnosis not present

## 2018-05-22 DIAGNOSIS — M25671 Stiffness of right ankle, not elsewhere classified: Secondary | ICD-10-CM | POA: Diagnosis not present

## 2018-05-22 DIAGNOSIS — M25571 Pain in right ankle and joints of right foot: Secondary | ICD-10-CM | POA: Diagnosis not present

## 2018-05-24 DIAGNOSIS — M25671 Stiffness of right ankle, not elsewhere classified: Secondary | ICD-10-CM | POA: Diagnosis not present

## 2018-05-24 DIAGNOSIS — M7661 Achilles tendinitis, right leg: Secondary | ICD-10-CM | POA: Diagnosis not present

## 2018-05-24 DIAGNOSIS — M25471 Effusion, right ankle: Secondary | ICD-10-CM | POA: Diagnosis not present

## 2018-05-24 DIAGNOSIS — M25571 Pain in right ankle and joints of right foot: Secondary | ICD-10-CM | POA: Diagnosis not present

## 2018-05-28 ENCOUNTER — Ambulatory Visit (INDEPENDENT_AMBULATORY_CARE_PROVIDER_SITE_OTHER): Payer: Medicare Other | Admitting: Family Medicine

## 2018-05-28 ENCOUNTER — Ambulatory Visit: Payer: Medicare Other | Admitting: Family Medicine

## 2018-05-28 ENCOUNTER — Encounter: Payer: Self-pay | Admitting: Family Medicine

## 2018-05-28 VITALS — BP 122/77 | HR 80 | Ht 63.0 in | Wt 205.0 lb

## 2018-05-28 DIAGNOSIS — M25571 Pain in right ankle and joints of right foot: Secondary | ICD-10-CM

## 2018-05-28 NOTE — Progress Notes (Signed)
PCP: [name], MD  Subjective:   HPI: 9/30: Patient is a 67 y.o. female here for right medial ankle pain x2 weeks. Pt reports 6/10 sharp medial and posterior pain worse with standing. She gets relief with rest. She denies any specific injury. She does report h/o plantar fasciitis in the past and has been doing those exercises for her feet. She doesn't report plantar pain at this time. She took ibuprofen and tylenol. She also uses biofreeze, arnaca gel and aspercream. She wears orthotics that she got form her podiatrist a few years ago. She denies any swelling, erythema or brusing. No numbness or tingling. No skin changes.  11/11: Patient reports she's doing much better. Only a little bit of pain distally in achilles area. She's doing physical therapy and home exercises. No pain currently though. Not consistently using nitro. Not sure if the voltaren gel is helping at all. Using her orthotics. No skin changes.  Past Medical History:  Diagnosis Date  . Arthritis   . Baker's cyst of knee, right 02/16/2017  . Cancer ([place])    thyroid (2002)  . Cystic breast   . GERD (gastroesophageal reflux disease)   . Hepatic cyst 02/02/2015  . Hyperlipidemia   . Hypertension   . Insomnia 07/23/2016  . Menopause   . Migraine   . Palpitations   . Preventative health care 01/18/2016  . Sleep apnea 02/02/2015  . Thyroid disease     Current Outpatient Medications on File Prior to Visit  Medication Sig Dispense Refill  . acetaminophen (TYLENOL) 325 MG tablet Take 650 mg by mouth every 6 (six) hours as needed.    Marland Kitchen albuterol (PROAIR HFA) 108 (90 Base) MCG/ACT inhaler Inhale 2 inhaltions TID prn 8.5 g 0  . b complex-C-folic acid 1 MG capsule Take 1 capsule by mouth daily.    . Cholecalciferol ([employer]S VIT D 5000 HIGH-POTENCY PO) Take 5,000 Units by mouth.    . diclofenac sodium (VOLTAREN) 1 % GEL Apply 2 g topically 4 (four) times daily. 3 Tube 1  . fluticasone (FLONASE) 50 MCG/ACT nasal spray SHAKE  LIQUID AND USE 2 SPRAYS IN EACH NOSTRIL DAILY 16 g 3  . hyoscyamine (LEVSIN SL) 0.125 MG SL tablet PLACE 8 TABLETS UNDER THE TONGUE EVERY 4 HOURS AS NEEDED 30 tablet 0  . loratadine (CLARITIN) 10 MG tablet Take 10 mg by mouth daily.    . montelukast (SINGULAIR) 10 MG tablet Take 1 tablet (10 mg total) by mouth daily as needed. 90 tablet 1  . nitroGLYCERIN (NITRODUR - DOSED IN MG/24 HR) 0.2 mg/hr patch Apply 1/4th patch to affected ankle, change daily 30 patch 1  . OVER THE COUNTER MEDICATION Potassium    . SYNTHROID 150 MCG tablet TAKE 1 TABLET BY MOUTH EVERY DAY BEFORE BREAKFAST 90 tablet 1  . triamterene-hydrochlorothiazide (MAXZIDE) 75-50 MG tablet Take 1 tablet by mouth daily. 90 tablet 1   No current facility-administered medications on file prior to visit.     Past Surgical History:  Procedure Laterality Date  . ABDOMINAL HYSTERECTOMY     took right ovary and uterus  . APPENDECTOMY    . CARPAL TUNNEL RELEASE Right   . CESAREAN SECTION    . CHOLECYSTECTOMY    . EYE SURGERY Bilateral 2017   cataracts  . KNEE ARTHROPLASTY      Allergies  Allergen Reactions  . Levaquin [Levofloxacin] Shortness Of Breath  . Clindamycin/Lincomycin     Pruritus No rash  . Penicillins  Positive on allergy testing 'years ago.' Has never had penicillin. Has tolerated amoxicillin.  . Codeine Itching    Social History   Socioeconomic History  . Marital status: Divorced    Spouse name: Not on file  . Number of children: Not on file  . Years of education: Not on file  . Highest education level: Not on file  Occupational History  . Not on file  Social Needs  . Financial resource strain: Not on file  . Food insecurity:    Worry: Not on file    Inability: Not on file  . Transportation needs:    Medical: Not on file    Non-medical: Not on file  Tobacco Use  . Smoking status: Never Smoker  . Smokeless tobacco: Never Used  Substance and Sexual Activity  . Alcohol use: No     Alcohol/week: 0.0 standard drinks  . Drug use: No  . Sexual activity: Never    Birth control/protection: Other-see comments, Surgical    Comment: Hysterectomy, lives with mother  Lifestyle  . Physical activity:    Days per week: Not on file    Minutes per session: Not on file  . Stress: Not on file  Relationships  . Social connections:    Talks on phone: Not on file    Gets together: Not on file    Attends religious service: Not on file    Active member of club or organization: Not on file    Attends meetings of clubs or organizations: Not on file    Relationship status: Not on file  . Intimate partner violence:    Fear of current or ex partner: Not on file    Emotionally abused: Not on file    Physically abused: Not on file    Forced sexual activity: Not on file  Other Topics Concern  . Not on file  Social History Narrative  . Not on file    Family History  Problem Relation Age of Onset  . Arthritis Mother   . Transient ischemic attack Mother   . Hypertension Mother   . Hyperlipidemia Mother   . Diabetes Father   . Heart disease Father   . Arthritis Father   . Kidney disease Father   . Hypertension Father   . Hyperlipidemia Father   . Cancer Sister 19       pancreatic  . Fibromyalgia Sister   . Asthma Maternal Grandfather        smoker  . Depression Maternal Grandfather   . Dementia Paternal Grandmother   . Diabetes Paternal Grandfather   . Hypertension Brother   . Scoliosis Daughter        had rods placed and removed  . Arthritis Sister   . Gout Sister   . GER disease Sister   . Cancer Sister        breast cancer  . Breast cancer Sister 69    BP 122/77   Pulse 80   Ht 5\' 3"  (1.6 m)   Wt 205 lb (93 kg)   BMI 36.31 kg/m   Review of Systems: See HPI above.     Objective:  Physical Exam:  Gen: NAD, comfortable in exam room  Right foot/ankle: No gross deformity, swelling, ecchymoses FROM with 5/5 strength all directions. TTP minimally  achilles at insertion.  No other tenderness including over post tib. Negative ant drawer and talar tilt.   Negative syndesmotic compression. Thompsons test negative. NV intact distally.  Assessment & Plan:  1.  Right medial ankle pain- 2/2 mild insertional achilles tendinitis.  Tibialis posterior tendinitis has resolved.  Continue orthotics.  Continue home exercises, transition from PT after 1-2 more visits.  Voltaren gel as needed.  Stop nitro patches.  F/u prn.

## 2018-05-28 NOTE — Patient Instructions (Signed)
You're doing great! Do home exercises 3 times a week for 6 more weeks. Go to therapy for at least one more visit then you can transition to just the home exercises after this or after 1 visit per week the next couple weeks. Voltaren gel as needed. You can stop the nitro patches. Follow up with me as needed.

## 2018-05-29 DIAGNOSIS — M25471 Effusion, right ankle: Secondary | ICD-10-CM | POA: Diagnosis not present

## 2018-05-29 DIAGNOSIS — M7661 Achilles tendinitis, right leg: Secondary | ICD-10-CM | POA: Diagnosis not present

## 2018-05-29 DIAGNOSIS — M25671 Stiffness of right ankle, not elsewhere classified: Secondary | ICD-10-CM | POA: Diagnosis not present

## 2018-05-29 DIAGNOSIS — M25571 Pain in right ankle and joints of right foot: Secondary | ICD-10-CM | POA: Diagnosis not present

## 2018-06-01 ENCOUNTER — Other Ambulatory Visit: Payer: Self-pay

## 2018-06-05 ENCOUNTER — Ambulatory Visit (INDEPENDENT_AMBULATORY_CARE_PROVIDER_SITE_OTHER): Payer: Medicare Other | Admitting: Family Medicine

## 2018-06-05 VITALS — BP 128/78 | HR 86 | Temp 98.4°F | Resp 18 | Ht 62.0 in | Wt 211.0 lb

## 2018-06-05 DIAGNOSIS — R112 Nausea with vomiting, unspecified: Secondary | ICD-10-CM

## 2018-06-05 DIAGNOSIS — Z23 Encounter for immunization: Secondary | ICD-10-CM

## 2018-06-05 DIAGNOSIS — I10 Essential (primary) hypertension: Secondary | ICD-10-CM

## 2018-06-05 DIAGNOSIS — E782 Mixed hyperlipidemia: Secondary | ICD-10-CM

## 2018-06-05 DIAGNOSIS — R490 Dysphonia: Secondary | ICD-10-CM

## 2018-06-05 DIAGNOSIS — R109 Unspecified abdominal pain: Secondary | ICD-10-CM | POA: Diagnosis not present

## 2018-06-05 DIAGNOSIS — E890 Postprocedural hypothyroidism: Secondary | ICD-10-CM | POA: Diagnosis not present

## 2018-06-05 DIAGNOSIS — M25471 Effusion, right ankle: Secondary | ICD-10-CM | POA: Diagnosis not present

## 2018-06-05 DIAGNOSIS — K2100 Gastro-esophageal reflux disease with esophagitis, without bleeding: Secondary | ICD-10-CM

## 2018-06-05 DIAGNOSIS — K219 Gastro-esophageal reflux disease without esophagitis: Secondary | ICD-10-CM

## 2018-06-05 DIAGNOSIS — R198 Other specified symptoms and signs involving the digestive system and abdomen: Secondary | ICD-10-CM | POA: Diagnosis not present

## 2018-06-05 DIAGNOSIS — M722 Plantar fascial fibromatosis: Secondary | ICD-10-CM | POA: Diagnosis not present

## 2018-06-05 DIAGNOSIS — M7661 Achilles tendinitis, right leg: Secondary | ICD-10-CM | POA: Diagnosis not present

## 2018-06-05 DIAGNOSIS — M25571 Pain in right ankle and joints of right foot: Secondary | ICD-10-CM | POA: Diagnosis not present

## 2018-06-05 DIAGNOSIS — R739 Hyperglycemia, unspecified: Secondary | ICD-10-CM

## 2018-06-05 DIAGNOSIS — J029 Acute pharyngitis, unspecified: Secondary | ICD-10-CM | POA: Diagnosis not present

## 2018-06-05 DIAGNOSIS — M25671 Stiffness of right ankle, not elsewhere classified: Secondary | ICD-10-CM | POA: Diagnosis not present

## 2018-06-05 DIAGNOSIS — T7819XA Other adverse food reactions, not elsewhere classified, initial encounter: Secondary | ICD-10-CM

## 2018-06-05 DIAGNOSIS — T781XXA Other adverse food reactions, not elsewhere classified, initial encounter: Secondary | ICD-10-CM

## 2018-06-05 MED ORDER — MONTELUKAST SODIUM 10 MG PO TABS: 10.0000 mg | ORAL_TABLET | Freq: Every day | ORAL | 1 refills | Status: DC | PRN

## 2018-06-05 MED ORDER — FLUTICASONE PROPIONATE 50 MCG/ACT NA SUSP: NASAL | 3 refills | Status: DC

## 2018-06-05 MED ORDER — HYOSCYAMINE SULFATE 0.125 MG SL SUBL: 0.1250 mg | SUBLINGUAL_TABLET | Freq: Two times a day (BID) | SUBLINGUAL | Status: DC | PRN

## 2018-06-05 MED ORDER — TRIAMTERENE-HCTZ 75-50 MG PO TABS: 1.0000 | ORAL_TABLET | Freq: Every day | ORAL | 1 refills | Status: DC

## 2018-06-05 NOTE — Patient Instructions (Signed)

## 2018-06-05 NOTE — Assessment & Plan Note (Signed)
On Levothyroxine, continue to monitor 

## 2018-06-06 LAB — LIPID PANEL
Cholesterol: 198 mg/dL (ref 0–200)
HDL: 50.2 mg/dL (ref >39.00)
LDL Cholesterol: 119 mg/dL — ABNORMAL HIGH (ref 0–99)
NonHDL: 147.68
Total CHOL/HDL Ratio: 4
Triglycerides: 141 mg/dL (ref 0.0–149.0)
VLDL: 28.2 mg/dL (ref 0.0–40.0)

## 2018-06-06 LAB — CBC WITH DIFFERENTIAL/PLATELET
Basophils Absolute: 0.1 10*3/uL (ref 0.0–0.1)
Basophils Relative: 1.3 % (ref 0.0–3.0)
Eosinophils Absolute: 0.3 10*3/uL (ref 0.0–0.7)
Eosinophils Relative: 2.5 % (ref 0.0–5.0)
HCT: 40.1 % (ref 36.0–46.0)
Hemoglobin: 13.1 g/dL (ref 12.0–15.0)
Lymphocytes Relative: 36.8 % (ref 12.0–46.0)
Lymphs Abs: 4.1 10*3/uL — ABNORMAL HIGH (ref 0.7–4.0)
MCHC: 32.7 g/dL (ref 30.0–36.0)
MCV: 90.9 fl (ref 78.0–100.0)
Monocytes Absolute: 0.9 10*3/uL (ref 0.1–1.0)
Monocytes Relative: 8.1 % (ref 3.0–12.0)
Neutro Abs: 5.7 10*3/uL (ref 1.4–7.7)
Neutrophils Relative %: 51.3 % (ref 43.0–77.0)
Platelets: 310 10*3/uL (ref 150.0–400.0)
RBC: 4.41 Mil/uL (ref 3.87–5.11)
RDW: 13.3 % (ref 11.5–15.5)
WBC: 11.2 10*3/uL — ABNORMAL HIGH (ref 4.0–10.5)

## 2018-06-06 LAB — TSH: TSH: 0.4 u[IU]/mL (ref 0.35–4.50)

## 2018-06-06 LAB — COMPREHENSIVE METABOLIC PANEL
ALT: 26 U/L (ref 0–35)
AST: 23 U/L (ref 0–37)
Albumin: 4 g/dL (ref 3.5–5.2)
Alkaline Phosphatase: 85 U/L (ref 39–117)
BUN: 15 mg/dL (ref 6–23)
CO2: 32 mEq/L (ref 19–32)
Calcium: 9.5 mg/dL (ref 8.4–10.5)
Chloride: 97 mEq/L (ref 96–112)
Creatinine, Ser: 0.86 mg/dL (ref 0.40–1.20)
GFR: 84.07 mL/min (ref >60.00)
Glucose, Bld: 90 mg/dL (ref 70–99)
Potassium: 3.6 mEq/L (ref 3.5–5.1)
Sodium: 138 mEq/L (ref 135–145)
Total Bilirubin: 0.3 mg/dL (ref 0.2–1.2)
Total Protein: 6.8 g/dL (ref 6.0–8.3)

## 2018-06-06 LAB — HEMOGLOBIN A1C: Hgb A1c MFr Bld: 6.4 % (ref 4.6–6.5)

## 2018-06-10 NOTE — Progress Notes (Signed)
Subjective:    Patient ID: Monica Wallace, female    DOB: 17-Feb-1951, 67 y.o.   MRN: [id number]  Chief Complaint  Patient presents with  . Follow-up    HPI Patient is in today for follow up. She is having dyspepsia, nausea, loose stool and frequent loose stool at times. No bloody or tarry stool. Is using IBGuard with some relief. Notes some anorexia andhas trouble sleeping. Denies CP/palp/SOB/HA/congestion/fevers or GU c/o. Taking meds as prescribed  Past Medical History:  Diagnosis Date  . Arthritis   . Baker's cyst of knee, right 02/16/2017  . Cancer ([place])    thyroid (2002)  . Cystic breast   . GERD (gastroesophageal reflux disease)   . Hepatic cyst 02/02/2015  . Hyperlipidemia   . Hypertension   . Insomnia 07/23/2016  . Menopause   . Migraine   . Palpitations   . Preventative health care 01/18/2016  . Sleep apnea 02/02/2015  . Thyroid disease     Past Surgical History:  Procedure Laterality Date  . ABDOMINAL HYSTERECTOMY     took right ovary and uterus  . APPENDECTOMY    . CARPAL TUNNEL RELEASE Right   . CESAREAN SECTION    . CHOLECYSTECTOMY    . EYE SURGERY Bilateral 2017   cataracts  . KNEE ARTHROPLASTY      Family History  Problem Relation Age of Onset  . Arthritis Mother   . Transient ischemic attack Mother   . Hypertension Mother   . Hyperlipidemia Mother   . Diabetes Father   . Heart disease Father   . Arthritis Father   . Kidney disease Father   . Hypertension Father   . Hyperlipidemia Father   . Cancer Sister 73       pancreatic  . Fibromyalgia Sister   . Asthma Maternal Grandfather        smoker  . Depression Maternal Grandfather   . Dementia Paternal Grandmother   . Diabetes Paternal Grandfather   . Hypertension Brother   . Scoliosis Daughter        had rods placed and removed  . Arthritis Sister   . Gout Sister   . GER disease Sister   . Cancer Sister        breast cancer  . Breast cancer Sister 56    Social History   Socioeconomic  History  . Marital status: Divorced    Spouse name: Not on file  . Number of children: Not on file  . Years of education: Not on file  . Highest education level: Not on file  Occupational History  . Not on file  Social Needs  . Financial resource strain: Not on file  . Food insecurity:    Worry: Not on file    Inability: Not on file  . Transportation needs:    Medical: Not on file    Non-medical: Not on file  Tobacco Use  . Smoking status: Never Smoker  . Smokeless tobacco: Never Used  Substance and Sexual Activity  . Alcohol use: No    Alcohol/week: 0.0 standard drinks  . Drug use: No  . Sexual activity: Never    Birth control/protection: Other-see comments, Surgical    Comment: Hysterectomy, lives with mother  Lifestyle  . Physical activity:    Days per week: Not on file    Minutes per session: Not on file  . Stress: Not on file  Relationships  . Social connections:    Talks on phone: Not on  file    Gets together: Not on file    Attends religious service: Not on file    Active member of club or organization: Not on file    Attends meetings of clubs or organizations: Not on file    Relationship status: Not on file  . Intimate partner violence:    Fear of current or ex partner: Not on file    Emotionally abused: Not on file    Physically abused: Not on file    Forced sexual activity: Not on file  Other Topics Concern  . Not on file  Social History Narrative  . Not on file    Outpatient Medications Prior to Visit  Medication Sig Dispense Refill  . acetaminophen (TYLENOL) 325 MG tablet Take 650 mg by mouth every 6 (six) hours as needed.    Marland Kitchen albuterol (PROAIR HFA) 108 (90 Base) MCG/ACT inhaler Inhale 2 inhaltions TID prn 8.5 g 0  . b complex-C-folic acid 1 MG capsule Take 1 capsule by mouth daily.    . Cholecalciferol (CVS VIT D 5000 HIGH-POTENCY PO) Take 5,000 Units by mouth.    . diclofenac sodium (VOLTAREN) 1 % GEL Apply 2 g topically 4 (four) times daily. 3  Tube 1  . loratadine (CLARITIN) 10 MG tablet Take 10 mg by mouth daily.    Marland Kitchen OVER THE COUNTER MEDICATION Potassium    . SYNTHROID 150 MCG tablet TAKE 1 TABLET BY MOUTH EVERY DAY BEFORE BREAKFAST 90 tablet 1  . fluticasone (FLONASE) 50 MCG/ACT nasal spray SHAKE LIQUID AND USE 2 SPRAYS IN EACH NOSTRIL DAILY 16 g 3  . hyoscyamine (LEVSIN SL) 0.125 MG SL tablet PLACE 8 TABLETS UNDER THE TONGUE EVERY 4 HOURS AS NEEDED 30 tablet 0  . montelukast (SINGULAIR) 10 MG tablet Take 1 tablet (10 mg total) by mouth daily as needed. 90 tablet 1  . nitroGLYCERIN (NITRODUR - DOSED IN MG/24 HR) 0.2 mg/hr patch Apply 1/4th patch to affected ankle, change daily (Patient not taking: Reported on 06/05/2018) 30 patch 1  . triamterene-hydrochlorothiazide (MAXZIDE) 75-50 MG tablet Take 1 tablet by mouth daily. 90 tablet 1   No facility-administered medications prior to visit.     Allergies  Allergen Reactions  . Levaquin [Levofloxacin] Shortness Of Breath  . Clindamycin/Lincomycin     Pruritus No rash  . Penicillins     Positive on allergy testing 'years ago.' Has never had penicillin. Has tolerated amoxicillin.  . Codeine Itching    Review of Systems  Constitutional: Positive for malaise/fatigue. Negative for fever.  HENT: Negative for congestion.   Eyes: Negative for blurred vision.  Respiratory: Negative for shortness of breath.   Cardiovascular: Negative for chest pain, palpitations and leg swelling.  Gastrointestinal: Positive for abdominal pain, blood in stool, heartburn and nausea. Negative for constipation and melena.  Genitourinary: Negative for dysuria and frequency.  Musculoskeletal: Positive for joint pain. Negative for falls.  Skin: Negative for rash.  Neurological: Negative for dizziness, loss of consciousness and headaches.  Endo/Heme/Allergies: Negative for environmental allergies.  Psychiatric/Behavioral: Negative for depression. The patient is not nervous/anxious.        Objective:     Physical Exam  Constitutional: She is oriented to person, place, and time. No distress.  HENT:  Head: Normocephalic and atraumatic.  Right Ear: External ear normal.  Left Ear: External ear normal.  Nose: Nose normal.  Mouth/Throat: Oropharynx is clear and moist. No oropharyngeal exudate.  Eyes: Pupils are equal, round, and reactive to light. Conjunctivae  are normal. Right eye exhibits no discharge. Left eye exhibits no discharge. No scleral icterus.  Neck: Normal range of motion. Neck supple. No thyromegaly present.  Cardiovascular: Normal rate, regular rhythm, normal heart sounds and intact distal pulses.  No murmur heard. Pulmonary/Chest: Effort normal and breath sounds normal. No respiratory distress. She has no wheezes. She has no rales.  Abdominal: Soft. Bowel sounds are normal. She exhibits no distension and no mass. There is no tenderness.  Musculoskeletal: Normal range of motion. She exhibits no edema or tenderness.  Lymphadenopathy:    She has no cervical adenopathy.  Neurological: She is alert and oriented to person, place, and time. She has normal reflexes. She displays normal reflexes. No cranial nerve deficit. Coordination normal.  Skin: Skin is warm and dry. No rash noted. She is not diaphoretic.    BP 128/78 (BP Location: Left Arm, Patient Position: Sitting, Cuff Size: Normal)   Pulse 86   Temp 98.4 F (36.9 C)   Resp 18   Ht 5\' 2"  (1.575 m)   Wt 211 lb (95.7 kg)   SpO2 98%   BMI 38.59 kg/m  Wt Readings from Last 3 Encounters:  06/05/18 211 lb (95.7 kg)  05/28/18 205 lb (93 kg)  04/16/18 207 lb (93.9 kg)     Lab Results  Component Value Date   WBC 11.2 (H) 06/05/2018   HGB 13.1 06/05/2018   HCT 40.1 06/05/2018   PLT 310.0 06/05/2018   GLUCOSE 90 06/05/2018   CHOL 198 06/05/2018   TRIG 141.0 06/05/2018   HDL 50.20 06/05/2018   LDLDIRECT 129.0 11/20/2017   LDLCALC 119 (H) 06/05/2018   ALT 26 06/05/2018   AST 23 06/05/2018   NA 138 06/05/2018   K 3.6  06/05/2018   CL 97 06/05/2018   CREATININE 0.86 06/05/2018   BUN 15 06/05/2018   CO2 32 06/05/2018   TSH 0.40 06/05/2018   HGBA1C 6.4 06/05/2018    Lab Results  Component Value Date   TSH 0.40 06/05/2018   Lab Results  Component Value Date   WBC 11.2 (H) 06/05/2018   HGB 13.1 06/05/2018   HCT 40.1 06/05/2018   MCV 90.9 06/05/2018   PLT 310.0 06/05/2018   Lab Results  Component Value Date   NA 138 06/05/2018   K 3.6 06/05/2018   CO2 32 06/05/2018   GLUCOSE 90 06/05/2018   BUN 15 06/05/2018   CREATININE 0.86 06/05/2018   BILITOT 0.3 06/05/2018   ALKPHOS 85 06/05/2018   AST 23 06/05/2018   ALT 26 06/05/2018   PROT 6.8 06/05/2018   ALBUMIN 4.0 06/05/2018   CALCIUM 9.5 06/05/2018   ANIONGAP 6 09/15/2014   GFR 84.07 06/05/2018   Lab Results  Component Value Date   CHOL 198 06/05/2018   Lab Results  Component Value Date   HDL 50.20 06/05/2018   Lab Results  Component Value Date   LDLCALC 119 (H) 06/05/2018   Lab Results  Component Value Date   TRIG 141.0 06/05/2018   Lab Results  Component Value Date   CHOLHDL 4 06/05/2018   Lab Results  Component Value Date   HGBA1C 6.4 06/05/2018       Assessment & Plan:   Problem List Items Addressed This Visit    Hyperlipidemia    Encouraged heart healthy diet, increase exercise, avoid trans fats, consider a krill oil cap daily      Relevant Medications   triamterene-hydrochlorothiazide (MAXZIDE) 75-50 MG tablet   Other Relevant Orders  Lipid panel (Completed)   HTN (hypertension)   Relevant Medications   triamterene-hydrochlorothiazide (MAXZIDE) 75-50 MG tablet   Other Relevant Orders   TSH (Completed)   GERD (gastroesophageal reflux disease)    Worsening dyspepsia and abdominal pain. She is taking an OTC product called IBGuard and initially she felt it was helpful. Notes blood at times. Is referred to gastroenterology for consideration.       Relevant Medications   hyoscyamine (LEVSIN SL) 0.125 MG  SL tablet   Hypothyroidism    On Levothyroxine, continue to monitor      Chronic hoarseness    With some dyspepsia and possible food allergies is referred to ENT for consideration      Plantar fasciitis of right foot    Did well with treatment from sports med and pain is nearly resolved. Will continue exercises and stretches.      Hyperglycemia   Relevant Orders   Hemoglobin A1c (Completed)    Other Visit Diagnoses    Gastrointestinal food sensitivity    -  Primary   Relevant Orders   Ambulatory referral to Gastroenterology   Ambulatory referral to ENT   Nausea and vomiting, intractability of vomiting not specified, unspecified vomiting type       Relevant Orders   Ambulatory referral to Gastroenterology   Comprehensive metabolic panel (Completed)   Reflux esophagitis       Relevant Orders   Ambulatory referral to Gastroenterology   Abdominal pain, unspecified abdominal location       Relevant Orders   Ambulatory referral to Gastroenterology   CBC with Differential/Platelet (Completed)   Comprehensive metabolic panel (Completed)   TSH (Completed)   Sore throat       Relevant Orders   Ambulatory referral to Gastroenterology   Ambulatory referral to ENT      I have discontinued Erza Jardin's hyoscyamine and nitroGLYCERIN. I am also having her start on hyoscyamine. Additionally, I am having her maintain her b complex-C-folic acid, loratadine, Cholecalciferol (CVS VIT D 5000 HIGH-POTENCY PO), OVER THE COUNTER MEDICATION, acetaminophen, albuterol, SYNTHROID, diclofenac sodium, fluticasone, montelukast, and triamterene-hydrochlorothiazide.  Meds ordered this encounter  Medications  . fluticasone (FLONASE) 50 MCG/ACT nasal spray    Sig: SHAKE LIQUID AND USE 2 SPRAYS IN EACH NOSTRIL DAILY    Dispense:  16 g    Refill:  3  . montelukast (SINGULAIR) 10 MG tablet    Sig: Take 1 tablet (10 mg total) by mouth daily as needed.    Dispense:  90 tablet    Refill:  1  .  triamterene-hydrochlorothiazide (MAXZIDE) 75-50 MG tablet    Sig: Take 1 tablet by mouth daily.    Dispense:  90 tablet    Refill:  1  . hyoscyamine (LEVSIN SL) 0.125 MG SL tablet    Sig: Place 1-3 tablets (0.125-0.375 mg total) under the tongue 2 (two) times daily as needed for cramping.    Dispense:  30 tablet    Refill:  03     [name], MD

## 2018-06-10 NOTE — Assessment & Plan Note (Signed)
Did well with treatment from sports med and pain is nearly resolved. Will continue exercises and stretches.

## 2018-06-10 NOTE — Assessment & Plan Note (Signed)
With some dyspepsia and possible food allergies is referred to ENT for consideration

## 2018-06-10 NOTE — Assessment & Plan Note (Signed)
Worsening dyspepsia and abdominal pain. She is taking an OTC product called IBGuard and initially she felt it was helpful. Notes blood at times. Is referred to gastroenterology for consideration.

## 2018-06-10 NOTE — Assessment & Plan Note (Signed)
Encouraged heart healthy diet, increase exercise, avoid trans fats, consider a krill oil cap daily 

## 2018-06-22 ENCOUNTER — Other Ambulatory Visit: Payer: Self-pay | Admitting: Family Medicine

## 2018-06-28 DIAGNOSIS — K219 Gastro-esophageal reflux disease without esophagitis: Secondary | ICD-10-CM | POA: Diagnosis not present

## 2018-06-28 DIAGNOSIS — Z1211 Encounter for screening for malignant neoplasm of colon: Secondary | ICD-10-CM | POA: Diagnosis not present

## 2018-06-28 DIAGNOSIS — K449 Diaphragmatic hernia without obstruction or gangrene: Secondary | ICD-10-CM | POA: Diagnosis not present

## 2018-06-28 DIAGNOSIS — K582 Mixed irritable bowel syndrome: Secondary | ICD-10-CM | POA: Diagnosis not present

## 2018-08-03 NOTE — Progress Notes (Signed)
Subjective:   Monica Wallace is a 68 y.o. female who presents for Medicare Annual (Subsequent) preventive examination.  Pt is very pleasant and energetic. 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 Pt also has a new boyfriend x62yr  Review of Systems: No ROS.  Medicare Wellness Visit. Additional risk factors are reflected in the social history. Cardiac Risk Factors include: dyslipidemia;hypertension;obesity (BMI >30kg/m2) Sleep patterns: no issues Home Safety/Smoke Alarms: Feels safe in home. Smoke alarms in place.  Lives in 2 story home. Mother and sister lives her.   Female:   Pap- hysterectomy       Mammo- 11/27/17      Dexa scan-  03/20/15      CCS-06/08/15     Objective:     Vitals: BP 138/84 (BP Location: Left Arm, Patient Position: Sitting, Cuff Size: Normal)   Pulse 83   Ht 5' 2"  (1.575 m)   Wt 208 lb 6.4 oz (94.5 kg)   SpO2 97%   BMI 38.12 kg/m   Body mass index is 38.12 kg/m.  Advanced Directives 08/06/2018 07/21/2016 01/18/2016 02/02/2015 09/24/2014 09/15/2014  Does Patient Have a Medical Advance Directive? No No No No No No  Does patient want to make changes to medical advance directive? Yes (MAU/Ambulatory/Procedural Areas - Information given) Yes (MAU/Ambulatory/Procedural Areas - Information given) - - - -  Would patient like information on creating a medical advance directive? - - Yes - Educational materials given No - patient declined information Yes -Higher education careers advisergiven No - patient declined information    Tobacco Social History   Tobacco Use  Smoking Status Never Smoker  Smokeless Tobacco Never Used     Counseling given: Not Answered   Clinical Intake: Pain : No/denies pain  Past Medical History:  Diagnosis Date  . Arthritis   . Baker's cyst of knee, right 02/16/2017  . Cancer ([place]    thyroid (2002)  . Cystic breast   . GERD (gastroesophageal reflux disease)   . Hepatic cyst 02/02/2015  . Hyperlipidemia   .  Hypertension   . Insomnia 07/23/2016  . Menopause   . Migraine   . Palpitations   . Preventative health care 01/18/2016  . Sleep apnea 02/02/2015  . Thyroid disease    Past Surgical History:  Procedure Laterality Date  . ABDOMINAL HYSTERECTOMY     took right ovary and uterus  . APPENDECTOMY    . CARPAL TUNNEL RELEASE Right   . CESAREAN SECTION    . CHOLECYSTECTOMY    . EYE SURGERY Bilateral 2017   cataracts  . KNEE ARTHROPLASTY     Family History  Problem Relation Age of Onset  . Arthritis Mother   . Transient ischemic attack Mother   . Hypertension Mother   . Hyperlipidemia Mother   . Diabetes Father   . Heart disease Father   . Arthritis Father   . Kidney disease Father   . Hypertension Father   . Hyperlipidemia Father   . Cancer Sister 667      pancreatic  . Fibromyalgia Sister   . Asthma Maternal Grandfather        smoker  . Depression Maternal Grandfather   . Dementia Paternal Grandmother   . Diabetes Paternal Grandfather   . Hypertension Brother   . Scoliosis Daughter        had rods placed and removed  . Arthritis Sister   . Gout Sister   . GER  disease Sister   . Cancer Sister        breast cancer  . Breast cancer Sister 17   Social History   Socioeconomic History  . Marital status: Divorced    Spouse name: Not on file  . Number of children: Not on file  . Years of education: Not on file  . Highest education level: Not on file  Occupational History  . Not on file  Social Needs  . Financial resource strain: Not on file  . Food insecurity:    Worry: Not on file    Inability: Not on file  . Transportation needs:    Medical: Not on file    Non-medical: Not on file  Tobacco Use  . Smoking status: Never Smoker  . Smokeless tobacco: Never Used  Substance and Sexual Activity  . Alcohol use: No    Alcohol/week: 0.0 standard drinks  . Drug use: No  . Sexual activity: Yes    Birth control/protection: Other-see comments, Surgical    Comment:   boyfriend  Lifestyle  . Physical activity:    Days per week: Not on file    Minutes per session: Not on file  . Stress: Not on file  Relationships  . Social connections:    Talks on phone: Not on file    Gets together: Not on file    Attends religious service: Not on file    Active member of club or organization: Not on file    Attends meetings of clubs or organizations: Not on file    Relationship status: Not on file  Other Topics Concern  . Not on file  Social History Narrative  . Not on file    Outpatient Encounter Medications as of 08/06/2018  Medication Sig  . acetaminophen (TYLENOL) 325 MG tablet Take 650 mg by mouth every 6 (six) hours as needed.  Marland Kitchen albuterol (PROAIR HFA) 108 (90 Base) MCG/ACT inhaler Inhale 2 inhaltions TID prn  . b complex-C-folic acid 1 MG capsule Take 1 capsule by mouth daily.  . Cholecalciferol (CVS VIT D 5000 HIGH-POTENCY PO) Take 5,000 Units by mouth.  . diclofenac sodium (VOLTAREN) 1 % GEL Apply 2 g topically 4 (four) times daily.  . fluticasone (FLONASE) 50 MCG/ACT nasal spray SHAKE LIQUID AND USE 2 SPRAYS IN EACH NOSTRIL DAILY  . hyoscyamine (LEVSIN SL) 0.125 MG SL tablet Place 1-3 tablets (0.125-0.375 mg total) under the tongue 2 (two) times daily as needed for cramping.  . loratadine (CLARITIN) 10 MG tablet Take 10 mg by mouth daily.  . montelukast (SINGULAIR) 10 MG tablet Take 1 tablet (10 mg total) by mouth daily as needed.  Marland Kitchen OVER THE COUNTER MEDICATION Potassium  . SYNTHROID 150 MCG tablet TAKE 1 TABLET BY MOUTH EVERY DAY BEFORE BREAKFAST  . triamterene-hydrochlorothiazide (MAXZIDE) 75-50 MG tablet Take 1 tablet by mouth daily.   No facility-administered encounter medications on file as of 08/06/2018.     Activities of Daily Living In your present state of health, do you have any difficulty performing the following activities: 08/06/2018  Hearing? N  Vision? N  Comment only wearing glasses for reading . Gets yearly eye exam.  Difficulty  concentrating or making decisions? N  Walking or climbing stairs? N  Dressing or bathing? N  Doing errands, shopping? N  Preparing Food and eating ? N  Using the Toilet? N  In the past six months, have you accidently leaked urine? N  Do you have problems with loss of bowel  control? N  Managing your Medications? N  Managing your Finances? N  Housekeeping or managing your Housekeeping? N  Some recent data might be hidden    Patient Care Team: [name], MD as PCP - General (Family Medicine) Philemon Kingdom, MD as Consulting Physician (Internal Medicine) Juanita Craver, MD as Consulting Physician (Gastroenterology) de Burney, Mike Gip, MD as Consulting Physician (Pulmonary Disease) Juanita Craver, MD as Consulting Physician (Gastroenterology)    Assessment:   This is a routine wellness examination for [place]. Physical assessment deferred to PCP.  Exercise Activities and Dietary recommendations Current Exercise Habits: The patient does not participate in regular exercise at present(works full time), Exercise limited by: None identified   Diet (meal preparation, eat out, water intake, caffeinated beverages, dairy products, fruits and vegetables): 24 hr recall Breakfast: sausage biscuit Lunch: skipped Dinner: catfish, mac-n-cheese, okra muffin    Drinks water  Goals    . Increase physical activity    . Increase water intake    . Weight (lb) < 200 lb (90.7 kg)       Fall Risk Fall Risk  08/06/2018 06/01/2018 02/15/2018 07/21/2016 01/18/2016  Falls in the past year? 0 0 No No No  Comment - Emmi Telephone Survey: data to providers prior to load Emmi Telephone Survey: data to providers prior to load - -    Depression Screen PHQ 2/9 Scores 08/06/2018 07/21/2016 01/18/2016 09/09/2014  PHQ - 2 Score 0 1 0 0     Cognitive Function Ad8 score reviewed for issuesno  Issues making decisions:no  Less interest in hobbies / activities:no  Repeats questions, stories (family  complaining):no  Trouble using ordinary gadgets (microwave, computer, phone):no  Forgets the month or year: no  Mismanaging finances: no  Remembering appts:no  Daily problems with thinking and/or memory:no Ad8 score is=0   MMSE - Mini Mental State Exam 07/21/2016  Orientation to time 5  Orientation to Place 5  Registration 3  Attention/ Calculation 5  Recall 3  Language- name 2 objects 2  Language- repeat 1  Language- follow 3 step command 3  Language- read & follow direction 1  Write a sentence 1  Copy design 1  Total score 30        Immunization History  Administered Date(s) Administered  . Influenza, High Dose Seasonal PF 06/05/2018  . Influenza-Unspecified 03/19/2015, 05/09/2016  . Pneumococcal Conjugate-13 01/18/2016  . Tdap 07/01/2014   Screening Tests Health Maintenance  Topic Date Due  . COLON CANCER SCREENING ANNUAL FOBT  07/02/2015  . PNA vac Low Risk Adult (2 of 2 - PPSV23) 01/17/2017  . MAMMOGRAM  11/28/2019  . TETANUS/TDAP  07/01/2024  . COLONOSCOPY  06/07/2025  . INFLUENZA VACCINE  Completed  . DEXA SCAN  Completed  . Hepatitis C Screening  Completed       Plan:    Please schedule your next medicare wellness visit with me in 1 yr.  Continue to eat heart healthy diet (full of fruits, vegetables, whole grains, lean protein, water--limit salt, fat, and sugar intake) and increase physical activity as tolerated.  Bring a copy of your living will and/or healthcare power of attorney to your next office visit.  I have ordered your bone density scan. Please schedule.  I have personally reviewed and noted the following in the patient's chart:   . Medical and social history . Use of alcohol, tobacco or illicit drugs  . Current medications and supplements . Functional ability and status . Nutritional status .  Physical activity . Advanced directives . List of other physicians . Hospitalizations, surgeries, and ER visits in previous 12  months . Vitals . Screenings to include cognitive, depression, and falls . Referrals and appointments  In addition, I have reviewed and discussed with patient certain preventive protocols, quality metrics, and best practice recommendations. A written personalized care plan for preventive services as well as general preventive health recommendations were provided to patient.     Shela Nevin, [place]  08/06/2018

## 2018-08-06 ENCOUNTER — Ambulatory Visit (INDEPENDENT_AMBULATORY_CARE_PROVIDER_SITE_OTHER): Payer: Medicare Other | Admitting: *Deleted

## 2018-08-06 ENCOUNTER — Encounter: Payer: Self-pay | Admitting: *Deleted

## 2018-08-06 VITALS — BP 138/84 | HR 83 | Ht 62.0 in | Wt 208.4 lb

## 2018-08-06 DIAGNOSIS — Z780 Asymptomatic menopausal state: Secondary | ICD-10-CM

## 2018-08-06 DIAGNOSIS — Z0000 Encounter for general adult medical examination without abnormal findings: Secondary | ICD-10-CM

## 2018-08-06 NOTE — Progress Notes (Signed)
RN note reviewed and agree.  Ashaz Robling S O'Sullivan NP 

## 2018-08-06 NOTE — Patient Instructions (Addendum)
Please schedule your next medicare wellness visit with me in 1 yr.  Continue to eat heart healthy diet (full of fruits, vegetables, whole grains, lean protein, water--limit salt, fat, and sugar intake) and increase physical activity as tolerated.  Bring a copy of your living will and/or healthcare power of attorney to your next office visit.  I have ordered your bone density scan. Please schedule.   Monica Wallace , Thank you for taking time to come for your Medicare Wellness Visit. I appreciate your ongoing commitment to your health goals. Please review the following plan we discussed and let me know if I can assist you in the future.   These are the goals we discussed: Goals    . Increase physical activity    . Increase water intake    . Weight (lb) < 200 lb (90.7 kg)       This is a list of the screening recommended for you and due dates:  Health Maintenance  Topic Date Due  . Stool Blood Test  07/02/2015  . Pneumonia vaccines (2 of 2 - PPSV23) 01/17/2017  . Mammogram  11/28/2019  . Tetanus Vaccine  07/01/2024  . Colon Cancer Screening  06/07/2025  . Flu Shot  Completed  . DEXA scan (bone density measurement)  Completed  .  Hepatitis C: One time screening is recommended by Center for Disease Control  (CDC) for  adults born from [birth year] through 1967.   Completed    Health Maintenance After Age 61 After age 23, you are at a higher risk for certain long-term diseases and infections as well as injuries from falls. Falls are a major cause of broken bones and head injuries in people who are older than age 51. Getting regular preventive care can help to keep you healthy and well. Preventive care includes getting regular testing and making lifestyle changes as recommended by your health care provider. Talk with your health care provider about:  Which screenings and tests you should have. A screening is a test that checks for a disease when you have no symptoms.  A diet and exercise plan that  is right for you. What should I know about screenings and tests to prevent falls? Screening and testing are the best ways to find a health problem early. Early diagnosis and treatment give you the best chance of managing medical conditions that are common after age 78. Certain conditions and lifestyle choices may make you more likely to have a fall. Your health care provider may recommend:  Regular vision checks. Poor vision and conditions such as cataracts can make you more likely to have a fall. If you wear glasses, make sure to get your prescription updated if your vision changes.  Medicine review. Work with your health care provider to regularly review all of the medicines you are taking, including over-the-counter medicines. Ask your health care provider about any side effects that may make you more likely to have a fall. Tell your health care provider if any medicines that you take make you feel dizzy or sleepy.  Osteoporosis screening. Osteoporosis is a condition that causes the bones to get weaker. This can make the bones weak and cause them to break more easily.  Blood pressure screening. Blood pressure changes and medicines to control blood pressure can make you feel dizzy.  Strength and balance checks. Your health care provider may recommend certain tests to check your strength and balance while standing, walking, or changing positions.  Foot health exam. Foot  pain and numbness, as well as not wearing proper footwear, can make you more likely to have a fall.  Depression screening. You may be more likely to have a fall if you have a fear of falling, feel emotionally low, or feel unable to do activities that you used to do.  Alcohol use screening. Using too much alcohol can affect your balance and may make you more likely to have a fall. What actions can I take to lower my risk of falls? General instructions  Talk with your health care provider about your risks for falling. Tell your  health care provider if: ? You fall. Be sure to tell your health care provider about all falls, even ones that seem minor. ? You feel dizzy, sleepy, or off-balance.  Take over-the-counter and prescription medicines only as told by your health care provider. These include any supplements.  Eat a healthy diet and maintain a healthy weight. A healthy diet includes low-fat dairy products, low-fat (lean) meats, and fiber from whole grains, beans, and lots of fruits and vegetables. Home safety  Remove any tripping hazards, such as rugs, cords, and clutter.  Install safety equipment such as grab bars in bathrooms and safety rails on stairs.  Keep rooms and walkways well-lit. Activity   Follow a regular exercise program to stay fit. This will help you maintain your balance. Ask your health care provider what types of exercise are appropriate for you.  If you need a cane or walker, use it as recommended by your health care provider.  Wear supportive shoes that have nonskid soles. Lifestyle  Do not drink alcohol if your health care provider tells you not to drink.  If you drink alcohol, limit how much you have: ? 0-1 drink a day for women. ? 0-2 drinks a day for men.  Be aware of how much alcohol is in your drink. In the [place] one drink equals one typical bottle of beer (12 oz), one-half glass of wine (5 oz), or one shot of hard liquor (1 oz).  Do not use any products that contain nicotine or tobacco, such as cigarettes and e-cigarettes. If you need help quitting, ask your health care provider. Summary  Having a healthy lifestyle and getting preventive care can help to protect your health and wellness after age 26.  Screening and testing are the best way to find a health problem early and help you avoid having a fall. Early diagnosis and treatment give you the best chance for managing medical conditions that are more common for people who are older than age 59.  Falls are a major cause  of broken bones and head injuries in people who are older than age 61. Take precautions to prevent a fall at home.  Work with your health care provider to learn what changes you can make to improve your health and wellness and to prevent falls. This information is not intended to replace advice given to you by your health care provider. Make sure you discuss any questions you have with your health care provider. Document Released: 05/17/2017 Document Revised: 05/17/2017 Document Reviewed: 05/17/2017 Elsevier Interactive Patient Education  2019 [employer].

## 2018-08-08 ENCOUNTER — Encounter: Payer: Self-pay | Admitting: Family Medicine

## 2018-08-08 ENCOUNTER — Telehealth: Payer: Self-pay | Admitting: *Deleted

## 2018-08-08 DIAGNOSIS — K449 Diaphragmatic hernia without obstruction or gangrene: Secondary | ICD-10-CM | POA: Diagnosis not present

## 2018-08-08 DIAGNOSIS — K219 Gastro-esophageal reflux disease without esophagitis: Secondary | ICD-10-CM | POA: Diagnosis not present

## 2018-08-08 NOTE — Telephone Encounter (Signed)
Received colonoscopy results from [facility]; forwarded to provider/SLS 01/22

## 2018-09-20 ENCOUNTER — Other Ambulatory Visit: Payer: Self-pay | Admitting: Family Medicine

## 2018-10-02 DIAGNOSIS — Z800 Family history of malignant neoplasm of digestive organs: Secondary | ICD-10-CM | POA: Diagnosis not present

## 2018-10-02 DIAGNOSIS — K219 Gastro-esophageal reflux disease without esophagitis: Secondary | ICD-10-CM | POA: Diagnosis not present

## 2018-10-02 DIAGNOSIS — K5900 Constipation, unspecified: Secondary | ICD-10-CM | POA: Diagnosis not present

## 2018-10-18 ENCOUNTER — Telehealth: Payer: Self-pay

## 2018-10-18 NOTE — Telephone Encounter (Signed)
She can do her Singulair, Claritin and flonase daily if she is not. We can send in prescriptions. She can add Mucinex twice a day to see if that helps. Otherwise she should consider an appt to discuss symptoms and treatments more.

## 2018-10-18 NOTE — Telephone Encounter (Signed)
Copied from [place] [id number]. Topic: General - Inquiry >> Oct 18, 2018 12:22 PM Virl Axe D wrote: Reason for CRM: Pt stated she has had a cough with no congestion or shortness of breath or fever for the past few weeks. The pollen has caused some drainage and laryngitis as well. She would like to know if there is anything that Dr. Charlett Blake could send in to her pharmacy or if she needs to do a virtual visit first. 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 Please advise. CB#[phone]

## 2018-10-19 MED ORDER — FLUTICASONE PROPIONATE 50 MCG/ACT NA SUSP: NASAL | 3 refills | Status: DC

## 2018-10-19 NOTE — Telephone Encounter (Signed)
Sent in new rx for flonase. She stated she has been doing the singulair ans the [employer]

## 2018-10-19 NOTE — Addendum Note (Signed)
Addended by: Magdalene Molly A on: 10/19/2018 02:49 PM   Modules accepted: Orders

## 2018-12-04 ENCOUNTER — Encounter: Payer: Self-pay | Admitting: Family Medicine

## 2018-12-04 ENCOUNTER — Other Ambulatory Visit: Payer: Self-pay

## 2018-12-04 ENCOUNTER — Ambulatory Visit: Payer: Medicare Other | Admitting: Family Medicine

## 2018-12-04 VITALS — BP 132/88 | HR 72 | Ht 62.0 in | Wt 209.4 lb

## 2018-12-04 DIAGNOSIS — I10 Essential (primary) hypertension: Secondary | ICD-10-CM

## 2018-12-05 ENCOUNTER — Other Ambulatory Visit: Payer: Self-pay

## 2018-12-05 ENCOUNTER — Ambulatory Visit (INDEPENDENT_AMBULATORY_CARE_PROVIDER_SITE_OTHER): Payer: Medicare Other | Admitting: Family Medicine

## 2018-12-05 DIAGNOSIS — J309 Allergic rhinitis, unspecified: Secondary | ICD-10-CM

## 2018-12-05 DIAGNOSIS — E782 Mixed hyperlipidemia: Secondary | ICD-10-CM | POA: Diagnosis not present

## 2018-12-05 DIAGNOSIS — E669 Obesity, unspecified: Secondary | ICD-10-CM

## 2018-12-05 DIAGNOSIS — I10 Essential (primary) hypertension: Secondary | ICD-10-CM

## 2018-12-05 DIAGNOSIS — Z7189 Other specified counseling: Secondary | ICD-10-CM | POA: Insufficient documentation

## 2018-12-05 DIAGNOSIS — R739 Hyperglycemia, unspecified: Secondary | ICD-10-CM | POA: Diagnosis not present

## 2018-12-05 DIAGNOSIS — E890 Postprocedural hypothyroidism: Secondary | ICD-10-CM | POA: Diagnosis not present

## 2018-12-05 DIAGNOSIS — R7989 Other specified abnormal findings of blood chemistry: Secondary | ICD-10-CM | POA: Diagnosis not present

## 2018-12-05 DIAGNOSIS — K589 Irritable bowel syndrome without diarrhea: Secondary | ICD-10-CM

## 2018-12-05 MED ORDER — TRIAMTERENE-HCTZ 75-50 MG PO TABS: 1.0000 | ORAL_TABLET | Freq: Every day | ORAL | 1 refills | Status: DC

## 2018-12-05 MED ORDER — SYNTHROID 150 MCG PO TABS: 150.0000 ug | ORAL_TABLET | Freq: Every day | ORAL | 1 refills | Status: DC

## 2018-12-05 NOTE — Progress Notes (Signed)
Virtual Visit via Video Note  I connected with Monica Wallace on 12/05/18 at  9:40 AM EDT by a video enabled telemedicine application and verified that I am speaking with the correct person using two identifiers.  Location: Patient: work Provider: home   I discussed the limitations of evaluation and management by telemedicine and the availability of in person appointments. The patient expressed understanding and agreed to proceed.    Subjective:    Patient ID: Monica Wallace, female    DOB: 1950/11/06, 68 y.o.   MRN: [id number]  No chief complaint on file.   HPI Patient is in today for follow up on chronic medical concerns including hypertension, hyperlipidemia, hyperglycemia and more. 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 She is otherwise social distancing and staying home as much as possible. She feels well. No recent febrile illness or hospitalizations. Her client did have RSV and she was symptomatic at the same time but she feels better now. Denies CP/palp/SOB/HA/congestion/fevers/GI or GU c/o. Taking meds as prescribed  Past Medical History:  Diagnosis Date  . Arthritis   . Baker's cyst of knee, right 02/16/2017  . Cancer ([place])    thyroid (2002)  . Cystic breast   . GERD (gastroesophageal reflux disease)   . Hepatic cyst 02/02/2015  . Hyperlipidemia   . Hypertension   . Insomnia 07/23/2016  . Menopause   . Migraine   . Palpitations   . Preventative health care 01/18/2016  . Sleep apnea 02/02/2015  . Thyroid disease     Past Surgical History:  Procedure Laterality Date  . ABDOMINAL HYSTERECTOMY     took right ovary and uterus  . APPENDECTOMY    . CARPAL TUNNEL RELEASE Right   . CESAREAN SECTION    . CHOLECYSTECTOMY    . EYE SURGERY Bilateral 2017   cataracts  . KNEE ARTHROPLASTY      Family History  Problem Relation Age of Onset  . Arthritis Mother   . Transient ischemic attack Mother   . Hypertension Mother   . Hyperlipidemia Mother   .  Diabetes Father   . Heart disease Father   . Arthritis Father   . Kidney disease Father   . Hypertension Father   . Hyperlipidemia Father   . Cancer Sister 79       pancreatic  . Fibromyalgia Sister   . Asthma Maternal Grandfather        smoker  . Depression Maternal Grandfather   . Dementia Paternal Grandmother   . Diabetes Paternal Grandfather   . Hypertension Brother   . Scoliosis Daughter        had rods placed and removed  . Arthritis Sister   . Gout Sister   . GER disease Sister   . Cancer Sister        breast cancer  . Breast cancer Sister 68    Social History   Socioeconomic History  . Marital status: Divorced    Spouse name: Not on file  . Number of children: Not on file  . Years of education: Not on file  . Highest education level: Not on file  Occupational History  . Not on file  Social Needs  . Financial resource strain: Not on file  . Food insecurity:    Worry: Not on file    Inability: Not on file  . Transportation needs:    Medical: Not on file    Non-medical: Not on file  Tobacco Use  .  Smoking status: Never Smoker  . Smokeless tobacco: Never Used  Substance and Sexual Activity  . Alcohol use: No    Alcohol/week: 0.0 standard drinks  . Drug use: No  . Sexual activity: Yes    Birth control/protection: Other-see comments, Surgical    Comment:  boyfriend  Lifestyle  . Physical activity:    Days per week: Not on file    Minutes per session: Not on file  . Stress: Not on file  Relationships  . Social connections:    Talks on phone: Not on file    Gets together: Not on file    Attends religious service: Not on file    Active member of club or organization: Not on file    Attends meetings of clubs or organizations: Not on file    Relationship status: Not on file  . Intimate partner violence:    Fear of current or ex partner: Not on file    Emotionally abused: Not on file    Physically abused: Not on file    Forced sexual activity: Not on  file  Other Topics Concern  . Not on file  Social History Narrative  . Not on file    Outpatient Medications Prior to Visit  Medication Sig Dispense Refill  . acetaminophen (TYLENOL) 325 MG tablet Take 650 mg by mouth every 6 (six) hours as needed.    Marland Kitchen albuterol (PROAIR HFA) 108 (90 Base) MCG/ACT inhaler Inhale 2 inhaltions TID prn 8.5 g 0  . b complex-C-folic acid 1 MG capsule Take 1 capsule by mouth daily.    . Cholecalciferol ([employer] VIT D 5000 HIGH-POTENCY PO) Take 5,000 Units by mouth.    . diclofenac sodium (VOLTAREN) 1 % GEL Apply 2 g topically 4 (four) times daily. 3 Tube 1  . fluticasone (FLONASE) 50 MCG/ACT nasal spray SHAKE LIQUID AND USE 2 SPRAYS IN EACH NOSTRIL DAILY 16 g 3  . hyoscyamine (LEVSIN SL) 0.125 MG SL tablet Place 1-3 tablets (0.125-0.375 mg total) under the tongue 2 (two) times daily as needed for cramping. 30 tablet 03  . loratadine (CLARITIN) 10 MG tablet Take 10 mg by mouth daily.    . montelukast (SINGULAIR) 10 MG tablet Take 1 tablet (10 mg total) by mouth daily as needed. 90 tablet 1  . OVER THE COUNTER MEDICATION Potassium    . SYNTHROID 150 MCG tablet TAKE 1 TABLET BY MOUTH EVERY DAY BEFORE BREAKFAST 90 tablet 0  . triamterene-hydrochlorothiazide (MAXZIDE) 75-50 MG tablet Take 1 tablet by mouth daily. 90 tablet 1   No facility-administered medications prior to visit.     Allergies  Allergen Reactions  . Levaquin [Levofloxacin] Shortness Of Breath  . Clindamycin/Lincomycin     Pruritus No rash  . Penicillins     Positive on allergy testing 'years ago.' Has never had penicillin. Has tolerated amoxicillin.  . Codeine Itching    Review of Systems  Constitutional: Negative for fever and malaise/fatigue.  HENT: Positive for congestion.   Eyes: Negative for blurred vision.  Respiratory: Negative for shortness of breath.   Cardiovascular: Negative for chest pain, palpitations and leg swelling.  Gastrointestinal: Negative for abdominal pain, blood in  stool and nausea.  Genitourinary: Negative for dysuria and frequency.  Musculoskeletal: Negative for falls.  Skin: Negative for rash.  Neurological: Negative for dizziness, loss of consciousness and headaches.  Endo/Heme/Allergies: Negative for environmental allergies.  Psychiatric/Behavioral: Negative for depression. The patient is not nervous/anxious.        Objective:  Physical Exam  There were no vitals taken for this visit. Wt Readings from Last 3 Encounters:  12/04/18 209 lb 6.4 oz (95 kg)  08/06/18 208 lb 6.4 oz (94.5 kg)  06/05/18 211 lb (95.7 kg)    Diabetic Foot Exam - Simple   No data filed     Lab Results  Component Value Date   WBC 11.2 (H) 06/05/2018   HGB 13.1 06/05/2018   HCT 40.1 06/05/2018   PLT 310.0 06/05/2018   GLUCOSE 90 06/05/2018   CHOL 198 06/05/2018   TRIG 141.0 06/05/2018   HDL 50.20 06/05/2018   LDLDIRECT 129.0 11/20/2017   LDLCALC 119 (H) 06/05/2018   ALT 26 06/05/2018   AST 23 06/05/2018   NA 138 06/05/2018   K 3.6 06/05/2018   CL 97 06/05/2018   CREATININE 0.86 06/05/2018   BUN 15 06/05/2018   CO2 32 06/05/2018   TSH 0.40 06/05/2018   HGBA1C 6.4 06/05/2018    Lab Results  Component Value Date   TSH 0.40 06/05/2018   Lab Results  Component Value Date   WBC 11.2 (H) 06/05/2018   HGB 13.1 06/05/2018   HCT 40.1 06/05/2018   MCV 90.9 06/05/2018   PLT 310.0 06/05/2018   Lab Results  Component Value Date   NA 138 06/05/2018   K 3.6 06/05/2018   CO2 32 06/05/2018   GLUCOSE 90 06/05/2018   BUN 15 06/05/2018   CREATININE 0.86 06/05/2018   BILITOT 0.3 06/05/2018   ALKPHOS 85 06/05/2018   AST 23 06/05/2018   ALT 26 06/05/2018   PROT 6.8 06/05/2018   ALBUMIN 4.0 06/05/2018   CALCIUM 9.5 06/05/2018   ANIONGAP 6 09/15/2014   GFR 84.07 06/05/2018   Lab Results  Component Value Date   CHOL 198 06/05/2018   Lab Results  Component Value Date   HDL 50.20 06/05/2018   Lab Results  Component Value Date   LDLCALC 119  (H) 06/05/2018   Lab Results  Component Value Date   TRIG 141.0 06/05/2018   Lab Results  Component Value Date   CHOLHDL 4 06/05/2018   Lab Results  Component Value Date   HGBA1C 6.4 06/05/2018       Assessment & Plan:   Problem List Items Addressed This Visit    Hyperlipidemia    Encouraged heart healthy diet, increase exercise, avoid trans fats, consider a krill oil cap daily      Relevant Orders   Lipid panel   HTN (hypertension)    no changes to meds. Encouraged heart healthy diet such as the DASH diet and exercise as tolerated. She has a BP cuff and is encouraged to check BP and pulse weekly and record.       Relevant Orders   CBC with Differential/Platelet   Comprehensive metabolic panel   TSH   Hypothyroidism    On Levothyroxine, continue to monitor      Irritable bowel syndrome    Avoid offending foods, start probiotics. Do not eat large meals in late evening and consider raising head of bed. She follows with Dr Juanita Craver, they tested her for gluten sensitivity and was negative.       Allergic rhinitis    Claritin po bid, flonase, nasal saline, and Singulair daily has helped her allergy symptoms dramatically this spring      Obesity (BMI 30-39.9)    Encouraged DASH diet, decrease po intake and increase exercise as tolerated. Needs 7-8 hours of sleep nightly. Avoid trans  fats, eat small, frequent meals every 4-5 hours with lean proteins, complex carbs and healthy fats. Minimize simple carbs. Consider weight watchers or NOOM APP      Relevant Orders   Comprehensive metabolic panel   Hyperglycemia    hgba1c acceptable, minimize simple carbs. Increase exercise as tolerated.       Relevant Orders   Hemoglobin A1c   Educated About Covid-19 Virus Infection    Encouraged to obtain a pulse oximeter for home use and continue masking, good hand hygiene, self care and add Vitamin C [phone] bid and Zinc 50 mg and deep breathing exercises       Other Visit  Diagnoses    Low TSH level    -  Primary   Relevant Orders   TSH      I am having Monica Wallace maintain her b complex-C-folic acid, loratadine, Cholecalciferol ([employer]S VIT D 5000 HIGH-POTENCY PO), OVER THE COUNTER MEDICATION, acetaminophen, albuterol, diclofenac sodium, montelukast, triamterene-hydrochlorothiazide, hyoscyamine, Synthroid, and fluticasone.  No orders of the defined types were placed in this encounter.   I discussed the assessment and treatment plan with the patient. The patient was provided an opportunity to ask questions and all were answered. The patient agreed with the plan and demonstrated an understanding of the instructions.   The patient was advised to call back or seek an in-person evaluation if the symptoms worsen or if the condition fails to improve as anticipated.  I provided 25 minutes of non-face-to-face time during this encounter.   [name], MD

## 2018-12-05 NOTE — Assessment & Plan Note (Signed)
hgba1c acceptable, minimize simple carbs. Increase exercise as tolerated.  

## 2018-12-05 NOTE — Assessment & Plan Note (Signed)
Encouraged to obtain a pulse oximeter for home use and continue masking, good hand hygiene, self care and add Vitamin C [phone] bid and Zinc 50 mg and deep breathing exercises

## 2018-12-05 NOTE — Assessment & Plan Note (Signed)
Avoid offending foods, start probiotics. Do not eat large meals in late evening and consider raising head of bed. She follows with Dr Juanita Craver, they tested her for gluten sensitivity and was negative.

## 2018-12-05 NOTE — Assessment & Plan Note (Signed)
Claritin po bid, flonase, nasal saline, and Singulair daily has helped her allergy symptoms dramatically this spring

## 2018-12-05 NOTE — Assessment & Plan Note (Signed)
Encouraged DASH diet, decrease po intake and increase exercise as tolerated. Needs 7-8 hours of sleep nightly. Avoid trans fats, eat small, frequent meals every 4-5 hours with lean proteins, complex carbs and healthy fats. Minimize simple carbs. Consider weight watchers or NOOM APP

## 2018-12-05 NOTE — Addendum Note (Signed)
Addended by: Magdalene Molly A on: 12/05/2018 12:49 PM   Modules accepted: Orders

## 2018-12-05 NOTE — Progress Notes (Signed)
Wrong phone number. Was able to contact patient

## 2018-12-05 NOTE — Assessment & Plan Note (Addendum)
no changes to meds. Encouraged heart healthy diet such as the DASH diet and exercise as tolerated. She has a BP cuff and is encouraged to check BP and pulse weekly and record.

## 2018-12-05 NOTE — Assessment & Plan Note (Signed)
Encouraged heart healthy diet, increase exercise, avoid trans fats, consider a krill oil cap daily 

## 2018-12-05 NOTE — Assessment & Plan Note (Signed)
On Levothyroxine, continue to monitor 

## 2018-12-13 ENCOUNTER — Other Ambulatory Visit (INDEPENDENT_AMBULATORY_CARE_PROVIDER_SITE_OTHER): Payer: Medicare Other

## 2018-12-13 ENCOUNTER — Other Ambulatory Visit: Payer: Self-pay

## 2018-12-13 DIAGNOSIS — E782 Mixed hyperlipidemia: Secondary | ICD-10-CM | POA: Diagnosis not present

## 2018-12-13 DIAGNOSIS — I10 Essential (primary) hypertension: Secondary | ICD-10-CM | POA: Diagnosis not present

## 2018-12-13 DIAGNOSIS — R739 Hyperglycemia, unspecified: Secondary | ICD-10-CM

## 2018-12-13 DIAGNOSIS — R7989 Other specified abnormal findings of blood chemistry: Secondary | ICD-10-CM

## 2018-12-13 DIAGNOSIS — E669 Obesity, unspecified: Secondary | ICD-10-CM

## 2018-12-14 LAB — CBC WITH DIFFERENTIAL/PLATELET
Basophils Absolute: 0.2 10*3/uL — ABNORMAL HIGH (ref 0.0–0.1)
Basophils Relative: 1.4 % (ref 0.0–3.0)
Eosinophils Absolute: 0.2 10*3/uL (ref 0.0–0.7)
Eosinophils Relative: 1.9 % (ref 0.0–5.0)
HCT: 39.8 % (ref 36.0–46.0)
Hemoglobin: 13.2 g/dL (ref 12.0–15.0)
Lymphocytes Relative: 36 % (ref 12.0–46.0)
Lymphs Abs: 4.1 10*3/uL — ABNORMAL HIGH (ref 0.7–4.0)
MCHC: 33 g/dL (ref 30.0–36.0)
MCV: 89.2 fl (ref 78.0–100.0)
Monocytes Absolute: 0.7 10*3/uL (ref 0.1–1.0)
Monocytes Relative: 6.4 % (ref 3.0–12.0)
Neutro Abs: 6.1 10*3/uL (ref 1.4–7.7)
Neutrophils Relative %: 54.3 % (ref 43.0–77.0)
Platelets: 320 10*3/uL (ref 150.0–400.0)
RBC: 4.47 Mil/uL (ref 3.87–5.11)
RDW: 14 % (ref 11.5–15.5)
WBC: 11.3 10*3/uL — ABNORMAL HIGH (ref 4.0–10.5)

## 2018-12-14 LAB — COMPREHENSIVE METABOLIC PANEL
ALT: 22 U/L (ref 0–35)
AST: 24 U/L (ref 0–37)
Albumin: 3.9 g/dL (ref 3.5–5.2)
Alkaline Phosphatase: 83 U/L (ref 39–117)
BUN: 17 mg/dL (ref 6–23)
CO2: 30 mEq/L (ref 19–32)
Calcium: 9.8 mg/dL (ref 8.4–10.5)
Chloride: 99 mEq/L (ref 96–112)
Creatinine, Ser: 0.94 mg/dL (ref 0.40–1.20)
GFR: 71.27 mL/min (ref >60.00)
Glucose, Bld: 136 mg/dL — ABNORMAL HIGH (ref 70–99)
Potassium: 3.4 mEq/L — ABNORMAL LOW (ref 3.5–5.1)
Sodium: 139 mEq/L (ref 135–145)
Total Bilirubin: 0.2 mg/dL (ref 0.2–1.2)
Total Protein: 7.1 g/dL (ref 6.0–8.3)

## 2018-12-14 LAB — HEMOGLOBIN A1C: Hgb A1c MFr Bld: 6.6 % — ABNORMAL HIGH (ref 4.6–6.5)

## 2018-12-14 LAB — LIPID PANEL
Cholesterol: 227 mg/dL — ABNORMAL HIGH (ref 0–200)
HDL: 50.2 mg/dL (ref >39.00)
NonHDL: 176.39
Total CHOL/HDL Ratio: 5
Triglycerides: 231 mg/dL — ABNORMAL HIGH (ref 0.0–149.0)
VLDL: 46.2 mg/dL — ABNORMAL HIGH (ref 0.0–40.0)

## 2018-12-14 LAB — TSH: TSH: 0.61 u[IU]/mL (ref 0.35–4.50)

## 2018-12-14 LAB — LDL CHOLESTEROL, DIRECT: Direct LDL: 135 mg/dL

## 2018-12-19 NOTE — Addendum Note (Signed)
Addended by: Magdalene Molly A on: 12/19/2018 01:53 PM   Modules accepted: Orders

## 2019-01-22 ENCOUNTER — Other Ambulatory Visit: Payer: Self-pay | Admitting: Family Medicine

## 2019-01-22 DIAGNOSIS — Z1231 Encounter for screening mammogram for malignant neoplasm of breast: Secondary | ICD-10-CM

## 2019-02-19 ENCOUNTER — Ambulatory Visit
Admission: RE | Admit: 2019-02-19 | Discharge: 2019-02-19 | Disposition: A | Discharge status: Home / Self Care | Payer: Medicare Other | Source: Ambulatory Visit | Attending: Family | Admitting: Family

## 2019-02-19 ENCOUNTER — Other Ambulatory Visit: Payer: Self-pay

## 2019-02-19 DIAGNOSIS — Z780 Asymptomatic menopausal state: Secondary | ICD-10-CM

## 2019-02-19 DIAGNOSIS — Z13820 Encounter for screening for osteoporosis: Secondary | ICD-10-CM | POA: Diagnosis not present

## 2019-03-06 ENCOUNTER — Other Ambulatory Visit: Payer: Self-pay

## 2019-03-06 ENCOUNTER — Ambulatory Visit
Admission: RE | Admit: 2019-03-06 | Discharge: 2019-03-06 | Disposition: A | Discharge status: Home / Self Care | Payer: Medicare Other | Source: Ambulatory Visit | Attending: Family Medicine | Admitting: Family Medicine

## 2019-03-06 DIAGNOSIS — Z1231 Encounter for screening mammogram for malignant neoplasm of breast: Secondary | ICD-10-CM | POA: Diagnosis not present

## 2019-04-05 ENCOUNTER — Other Ambulatory Visit: Payer: Self-pay | Admitting: Family Medicine

## 2019-04-22 DIAGNOSIS — Z23 Encounter for immunization: Secondary | ICD-10-CM | POA: Diagnosis not present

## 2019-06-14 ENCOUNTER — Other Ambulatory Visit: Payer: Self-pay | Admitting: Family Medicine

## 2019-08-08 ENCOUNTER — Ambulatory Visit (INDEPENDENT_AMBULATORY_CARE_PROVIDER_SITE_OTHER): Payer: Medicare Other | Admitting: *Deleted

## 2019-08-08 ENCOUNTER — Encounter: Payer: Self-pay | Admitting: *Deleted

## 2019-08-08 ENCOUNTER — Other Ambulatory Visit: Payer: Self-pay

## 2019-08-08 VITALS — BP 130/92 | Wt 206.6 lb

## 2019-08-08 DIAGNOSIS — Z0000 Encounter for general adult medical examination without abnormal findings: Secondary | ICD-10-CM | POA: Diagnosis not present

## 2019-08-08 NOTE — Progress Notes (Signed)
Virtual Visit via Audio Note  I connected with patient on 08/08/19 at  3:15 PM EST by audio enabled telemedicine application and verified that I am speaking with the correct person using two identifiers.   THIS ENCOUNTER IS A VIRTUAL VISIT DUE TO COVID-19 - PATIENT WAS NOT SEEN IN THE OFFICE. PATIENT HAS CONSENTED TO VIRTUAL VISIT / TELEMEDICINE VISIT   Location of patient: home  Location of provider: office  I discussed the limitations of evaluation and management by telemedicine and the availability of in person appointments. The patient expressed understanding and agreed to proceed.   Subjective:   Monica Wallace is a 69 y.o. female who presents for Medicare Annual (Subsequent) preventive examination.  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  Review of Systems:   Home Safety/Smoke Alarms: Feels safe in home. Smoke alarms in place.  Lives in 2 story home w/ sister.   Female:        Mammo- 03/06/19       Dexa scan-  02/19/19      CCS- 06/08/15    Objective:     Vitals: BP (!) 130/92 Comment: pt states she has been rushing around. pt reported  Wt 206 lb 9.6 oz (93.7 kg)   BMI 37.79 kg/m   Body mass index is 37.79 kg/m.  Advanced Directives 08/08/2019 08/06/2018 07/21/2016 01/18/2016 02/02/2015 09/24/2014 09/15/2014  Does Patient Have a Medical Advance Directive? No No No No No No No  Does patient want to make changes to medical advance directive? - Yes (MAU/Ambulatory/Procedural Areas - Information given) Yes (MAU/Ambulatory/Procedural Areas - Information given) - - - -  Would patient like information on creating a medical advance directive? No - Patient declined - - Yes - Scientist, clinical (histocompatibility and immunogenetics) given No - patient declined information Yes Higher education careers adviser given No - patient declined information    Tobacco Social History   Tobacco Use  Smoking Status Never Smoker  Smokeless Tobacco Never Used     Counseling given: Not Answered   Clinical  Intake: Pain : No/denies pain     Past Medical History:  Diagnosis Date  . Arthritis   . Baker's cyst of knee, right 02/16/2017  . Cancer ([place])    thyroid (2002)  . Cystic breast   . GERD (gastroesophageal reflux disease)   . Hepatic cyst 02/02/2015  . Hyperlipidemia   . Hypertension   . Insomnia 07/23/2016  . Menopause   . Migraine   . Palpitations   . Preventative health care 01/18/2016  . Sleep apnea 02/02/2015  . Thyroid disease    Past Surgical History:  Procedure Laterality Date  . ABDOMINAL HYSTERECTOMY     took right ovary and uterus  . APPENDECTOMY    . CARPAL TUNNEL RELEASE Right   . CESAREAN SECTION    . CHOLECYSTECTOMY    . EYE SURGERY Bilateral 2017   cataracts  . KNEE ARTHROPLASTY     Family History  Problem Relation Age of Onset  . Arthritis Mother   . Transient ischemic attack Mother   . Hypertension Mother   . Hyperlipidemia Mother   . Diabetes Father   . Heart disease Father   . Arthritis Father   . Kidney disease Father   . Hypertension Father   . Hyperlipidemia Father   . Cancer Sister 16       pancreatic  . Fibromyalgia Sister   . Asthma Maternal Grandfather  smoker  . Depression Maternal Grandfather   . Dementia Paternal Grandmother   . Diabetes Paternal Grandfather   . Hypertension Brother   . Scoliosis Daughter        had rods placed and removed  . Arthritis Sister   . Gout Sister   . GER disease Sister   . Cancer Sister        breast cancer  . Breast cancer Sister 1   Social History   Socioeconomic History  . Marital status: Divorced    Spouse name: Not on file  . Number of children: Not on file  . Years of education: Not on file  . Highest education level: Not on file  Occupational History  . Not on file  Tobacco Use  . Smoking status: Never Smoker  . Smokeless tobacco: Never Used  Substance and Sexual Activity  . Alcohol use: No    Alcohol/week: 0.0 standard drinks  . Drug use: No  . Sexual activity: Yes     Birth control/protection: Other-see comments, Surgical    Comment:  boyfriend  Other Topics Concern  . Not on file  Social History Narrative  . Not on file   Social Determinants of Health   Financial Resource Strain:   . Difficulty of Paying Living Expenses: Not on file  Food Insecurity:   . Worried About Charity fundraiser in the Last Year: Not on file  . Ran Out of Food in the Last Year: Not on file  Transportation Needs:   . Lack of Transportation (Medical): Not on file  . Lack of Transportation (Non-Medical): Not on file  Physical Activity:   . Days of Exercise per Week: Not on file  . Minutes of Exercise per Session: Not on file  Stress:   . Feeling of Stress : Not on file  Social Connections:   . Frequency of Communication with Friends and Family: Not on file  . Frequency of Social Gatherings with Friends and Family: Not on file  . Attends Religious Services: Not on file  . Active Member of Clubs or Organizations: Not on file  . Attends Archivist Meetings: Not on file  . Marital Status: Not on file    Outpatient Encounter Medications as of 08/08/2019  Medication Sig  . acetaminophen (TYLENOL) 325 MG tablet Take 650 mg by mouth every 6 (six) hours as needed.  Marland Kitchen albuterol (PROAIR HFA) 108 (90 Base) MCG/ACT inhaler Inhale 2 inhaltions TID prn  . b complex-C-folic acid 1 MG capsule Take 1 capsule by mouth daily.  . Cholecalciferol ([employer] VIT D 5000 HIGH-POTENCY PO) Take 5,000 Units by mouth.  . diclofenac sodium (VOLTAREN) 1 % GEL Apply 2 g topically 4 (four) times daily.  . fluticasone (FLONASE) 50 MCG/ACT nasal spray SHAKE LIQUID AND USE 2 SPRAYS IN EACH NOSTRIL DAILY  . hyoscyamine (LEVSIN SL) 0.125 MG SL tablet Place 1-3 tablets (0.125-0.375 mg total) under the tongue 2 (two) times daily as needed for cramping.  . loratadine (CLARITIN) 10 MG tablet Take 10 mg by mouth daily.  . montelukast (SINGULAIR) 10 MG tablet Take 1 tablet (10 mg total) by mouth daily as  needed.  Marland Kitchen OVER THE COUNTER MEDICATION Potassium  . SYNTHROID 150 MCG tablet TAKE 1 TABLET(150 MCG) BY MOUTH DAILY BEFORE BREAKFAST  . triamterene-hydrochlorothiazide (MAXZIDE) 75-50 MG tablet TAKE 1 TABLET BY MOUTH DAILY   No facility-administered encounter medications on file as of 08/08/2019.    Activities of Daily Living In your  present state of health, do you have any difficulty performing the following activities: 08/08/2019  Hearing? N  Vision? N  Difficulty concentrating or making decisions? N  Walking or climbing stairs? N  Dressing or bathing? N  Doing errands, shopping? N  Preparing Food and eating ? N  Using the Toilet? N  In the past six months, have you accidently leaked urine? N  Do you have problems with loss of bowel control? N  Managing your Medications? N  Managing your Finances? N  Housekeeping or managing your Housekeeping? N  Some recent data might be hidden    Patient Care Team: [name], MD as PCP - General (Family Medicine) Philemon Kingdom, MD as Consulting Physician (Internal Medicine) Juanita Craver, MD as Consulting Physician (Gastroenterology) de Kaaawa, Mike Gip, MD as Consulting Physician (Pulmonary Disease) Juanita Craver, MD as Consulting Physician (Gastroenterology)    Assessment:   This is a routine wellness examination for Rainbow. Physical assessment deferred to PCP.  Exercise Activities and Dietary recommendations Current Exercise Habits: The patient has a physically strenuous job, but has no regular exercise apart from work., Intensity: Mild, Exercise limited by: None identified Diet (meal preparation, eat out, water intake, caffeinated beverages, dairy products, fruits and vegetables): in general, a "healthy" diet  , well balanced   Goals    . Increase physical activity    . Increase water intake    . Weight (lb) < 200 lb (90.7 kg)       Fall Risk Fall Risk  08/08/2019 08/06/2018 06/01/2018 02/15/2018 07/21/2016  Falls in the  past year? 0 0 0 No No  Comment - - Emmi Telephone Survey: data to providers prior to load Emmi Telephone Survey: data to providers prior to load -  Number falls in past yr: 0 - - - -  Injury with Fall? 0 - - - -  Follow up Education provided;Falls prevention discussed - - - -   Depression Screen PHQ 2/9 Scores 08/08/2019 08/06/2018 07/21/2016 01/18/2016  PHQ - 2 Score 0 0 1 0     Cognitive Function Ad8 score reviewed for issues:  Issues making decisions:no  Less interest in hobbies / activities:no  Repeats questions, stories (family complaining):no  Trouble using ordinary gadgets (microwave, computer, phone):no  Forgets the month or year: no  Mismanaging finances: no  Remembering appts:no  Daily problems with thinking and/or memory:no Ad8 score is=0     MMSE - Mini Mental State Exam 07/21/2016  Orientation to time 5  Orientation to Place 5  Registration 3  Attention/ Calculation 5  Recall 3  Language- name 2 objects 2  Language- repeat 1  Language- follow 3 step command 3  Language- read & follow direction 1  Write a sentence 1  Copy design 1  Total score 30        Immunization History  Administered Date(s) Administered  . Influenza, High Dose Seasonal PF 06/05/2018  . Influenza-Unspecified 03/19/2015, 05/09/2016  . Pneumococcal Conjugate-13 01/18/2016  . Tdap 07/01/2014    Screening Tests Health Maintenance  Topic Date Due  . COLON CANCER SCREENING ANNUAL FOBT  07/02/2015  . PNA vac Low Risk Adult (2 of 2 - PPSV23) 01/17/2017  . INFLUENZA VACCINE  02/16/2019  . MAMMOGRAM  03/05/2021  . TETANUS/TDAP  07/01/2024  . COLONOSCOPY  06/07/2025  . DEXA SCAN  Completed  . Hepatitis C Screening  Completed       Plan:   See you next year!  Continue to  eat heart healthy diet (full of fruits, vegetables, whole grains, lean protein, water--limit salt, fat, and sugar intake) and increase physical activity as tolerated.  Continue doing brain stimulating  activities (puzzles, reading, adult coloring books, staying active) to keep memory sharp.   Bring a copy of your living will and/or healthcare power of attorney to your next office visit.    I have personally reviewed and noted the following in the patient's chart:   . Medical and social history . Use of alcohol, tobacco or illicit drugs  . Current medications and supplements . Functional ability and status . Nutritional status . Physical activity . Advanced directives . List of other physicians . Hospitalizations, surgeries, and ER visits in previous 12 months . Vitals . Screenings to include cognitive, depression, and falls . Referrals and appointments  In addition, I have reviewed and discussed with patient certain preventive protocols, quality metrics, and best practice recommendations. A written personalized care plan for preventive services as well as general preventive health recommendations were provided to patient.     Shela Nevin, [place]  08/08/2019

## 2019-08-08 NOTE — Patient Instructions (Signed)
See you next year!  Continue to eat heart healthy diet (full of fruits, vegetables, whole grains, lean protein, water--limit salt, fat, and sugar intake) and increase physical activity as tolerated.  Continue doing brain stimulating activities (puzzles, reading, adult coloring books, staying active) to keep memory sharp.   Bring a copy of your living will and/or healthcare power of attorney to your next office visit.   Monica Wallace , Thank you for taking time to come for your Medicare Wellness Visit. I appreciate your ongoing commitment to your health goals. Please review the following plan we discussed and let me know if I can assist you in the future.   These are the goals we discussed: Goals    . Increase physical activity    . Increase water intake    . Weight (lb) < 200 lb (90.7 kg)       This is a list of the screening recommended for you and due dates:  Health Maintenance  Topic Date Due  . Stool Blood Test  07/02/2015  . Pneumonia vaccines (2 of 2 - PPSV23) 01/17/2017  . Flu Shot  02/16/2019  . Mammogram  03/05/2021  . Tetanus Vaccine  07/01/2024  . Colon Cancer Screening  06/07/2025  . DEXA scan (bone density measurement)  Completed  .  Hepatitis C: One time screening is recommended by Center for Disease Control  (CDC) for  adults born from [birth year] through 1967.   Completed    Preventive Care 31 Years and Older, Female Preventive care refers to lifestyle choices and visits with your health care provider that can promote health and wellness. This includes:  A yearly physical exam. This is also called an annual well check.  Regular dental and eye exams.  Immunizations.  Screening for certain conditions.  Healthy lifestyle choices, such as diet and exercise. What can I expect for my preventive care visit? Physical exam Your health care provider will check:  Height and weight. These may be used to calculate body mass index (BMI), which is a measurement that tells if  you are at a healthy weight.  Heart rate and blood pressure.  Your skin for abnormal spots. Counseling Your health care provider may ask you questions about:  Alcohol, tobacco, and drug use.  Emotional well-being.  Home and relationship well-being.  Sexual activity.  Eating habits.  History of falls.  Memory and ability to understand (cognition).  Work and work Statistician.  Pregnancy and menstrual history. What immunizations do I need?  Influenza (flu) vaccine  This is recommended every year. Tetanus, diphtheria, and pertussis (Tdap) vaccine  You may need a Td booster every 10 years. Varicella (chickenpox) vaccine  You may need this vaccine if you have not already been vaccinated. Zoster (shingles) vaccine  You may need this after age 53. Pneumococcal conjugate (PCV13) vaccine  One dose is recommended after age 66. Pneumococcal polysaccharide (PPSV23) vaccine  One dose is recommended after age 20. Measles, mumps, and rubella (MMR) vaccine  You may need at least one dose of MMR if you were born in 1957 or later. You may also need a second dose. Meningococcal conjugate (MenACWY) vaccine  You may need this if you have certain conditions. Hepatitis A vaccine  You may need this if you have certain conditions or if you travel or work in places where you may be exposed to hepatitis A. Hepatitis B vaccine  You may need this if you have certain conditions or if you travel or work in  places where you may be exposed to hepatitis B. Haemophilus influenzae type b (Hib) vaccine  You may need this if you have certain conditions. You may receive vaccines as individual doses or as more than one vaccine together in one shot (combination vaccines). Talk with your health care provider about the risks and benefits of combination vaccines. What tests do I need? Blood tests  Lipid and cholesterol levels. These may be checked every 5 years, or more frequently depending on  your overall health.  Hepatitis C test.  Hepatitis B test. Screening  Lung cancer screening. You may have this screening every year starting at age 61 if you have a 30-pack-year history of smoking and currently smoke or have quit within the past 15 years.  Colorectal cancer screening. All adults should have this screening starting at age 87 and continuing until age 87. Your health care provider may recommend screening at age 71 if you are at increased risk. You will have tests every 1-10 years, depending on your results and the type of screening test.  Diabetes screening. This is done by checking your blood sugar (glucose) after you have not eaten for a while (fasting). You may have this done every 1-3 years.  Mammogram. This may be done every 1-2 years. Talk with your health care provider about how often you should have regular mammograms.  BRCA-related cancer screening. This may be done if you have a family history of breast, ovarian, tubal, or peritoneal cancers. Other tests  Sexually transmitted disease (STD) testing.  Bone density scan. This is done to screen for osteoporosis. You may have this done starting at age 80. Follow these instructions at home: Eating and drinking  Eat a diet that includes fresh fruits and vegetables, whole grains, lean protein, and low-fat dairy products. Limit your intake of foods with high amounts of sugar, saturated fats, and salt.  Take vitamin and mineral supplements as recommended by your health care provider.  Do not drink alcohol if your health care provider tells you not to drink.  If you drink alcohol: ? Limit how much you have to 0-1 drink a day. ? Be aware of how much alcohol is in your drink. In the [place] one drink equals one 12 oz bottle of beer (355 mL), one 5 oz glass of wine (148 mL), or one 1 oz glass of hard liquor (44 mL). Lifestyle  Take daily care of your teeth and gums.  Stay active. Exercise for at least 30 minutes on 5 or  more days each week.  Do not use any products that contain nicotine or tobacco, such as cigarettes, e-cigarettes, and chewing tobacco. If you need help quitting, ask your health care provider.  If you are sexually active, practice safe sex. Use a condom or other form of protection in order to prevent STIs (sexually transmitted infections).  Talk with your health care provider about taking a low-dose aspirin or statin. What's next?  Go to your health care provider once a year for a well check visit.  Ask your health care provider how often you should have your eyes and teeth checked.  Stay up to date on all vaccines. This information is not intended to replace advice given to you by your health care provider. Make sure you discuss any questions you have with your health care provider. Document Revised: 06/28/2018 Document Reviewed: 06/28/2018 Elsevier Patient Education  2020 [employer].

## 2019-08-09 ENCOUNTER — Ambulatory Visit (INDEPENDENT_AMBULATORY_CARE_PROVIDER_SITE_OTHER): Payer: Medicare Other | Admitting: Medical

## 2019-08-09 ENCOUNTER — Other Ambulatory Visit: Payer: Self-pay

## 2019-08-09 ENCOUNTER — Encounter: Payer: Self-pay | Admitting: Medical

## 2019-08-09 ENCOUNTER — Ambulatory Visit: Payer: Self-pay | Admitting: Medical

## 2019-08-09 VITALS — BP 136/71 | HR 72 | Temp 97.7°F | Resp 18 | Wt 206.6 lb

## 2019-08-09 DIAGNOSIS — M545 Low back pain, unspecified: Secondary | ICD-10-CM

## 2019-08-09 MED ORDER — KETOROLAC TROMETHAMINE 60 MG/2ML IM SOLN
60.0000 mg | Freq: Once | INTRAMUSCULAR | Status: AC
  Administered 2019-08-09: 17:00:00 60 mg via INTRAMUSCULAR

## 2019-08-09 MED ORDER — GABAPENTIN 100 MG PO CAPS: 100.0000 mg | ORAL_CAPSULE | Freq: Three times a day (TID) | ORAL | 0 refills | Status: DC

## 2019-08-09 MED ORDER — PREDNISONE 10 MG PO TABS: ORAL_TABLET | ORAL | 0 refills | Status: DC

## 2019-08-09 NOTE — Patient Instructions (Signed)
You do have 1 month of lower back pain with right sciatica type features.  We gave you Toradol 60 mg IM injection today.  Starting 4-day taper dose of prednisone tomorrow.  Also sent in gabapentin 100 mg 3 times daily prescription.  However please follow the titration schedule we discussed today on interview.  Future lumbar spine x-ray order to be done early next week.  Follow-up in 10 to 14 days or as needed.

## 2019-08-09 NOTE — Progress Notes (Signed)
Subjective:    Patient ID: Monica Wallace, female    DOB: 26-Sep-1950, 69 y.o.   MRN: [id number]  HPI  Pt has had low back pain that has been hurting for about a month. Pain in lower back. Rt si area, mid buttox and some pain toward her hamstring.  Pt still working full time. 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 Mild lifting daily. She tries to encourage him to walk but still has to pick him up at times.  Pt states oral nsaids bothers her stomach. She can tolerate prednisone in past.   Xray in 2017 showed   CLINICAL DATA:  Left low back pain radiating to left leg for 1 month. Initial encounter.  EXAM: LUMBAR SPINE - 2-3 VIEW  COMPARISON:  None.  FINDINGS: There is no evidence of lumbar spine fracture. Alignment is normal. Intervertebral disc spaces are maintained. Very mild anterior endplate spurring is seen at L2-3.  IMPRESSION: Negative exam.     Review of Systems  Constitutional: Negative for chills, fatigue and fever.  Respiratory: Negative for cough and chest tightness.   Cardiovascular: Negative for chest pain and palpitations.  Gastrointestinal: Negative for abdominal pain, constipation and vomiting.  Musculoskeletal: Positive for back pain. Negative for neck pain and neck stiffness.  Skin: Negative for rash.  Neurological: Negative for dizziness and headaches.  Hematological: Negative for adenopathy. Does not bruise/bleed easily.  Psychiatric/Behavioral: Negative for behavioral problems, decreased concentration and suicidal ideas. The patient is not nervous/anxious.     Past Medical History:  Diagnosis Date  . Arthritis   . Baker's cyst of knee, right 02/16/2017  . Cancer ([place])    thyroid (2002)  . Cystic breast   . GERD (gastroesophageal reflux disease)   . Hepatic cyst 02/02/2015  . Hyperlipidemia   . Hypertension   . Insomnia 07/23/2016  . Menopause   . Migraine   . Palpitations   . Preventative health care 01/18/2016  . Sleep apnea 02/02/2015    . Thyroid disease      Social History   Socioeconomic History  . Marital status: Divorced    Spouse name: Not on file  . Number of children: Not on file  . Years of education: Not on file  . Highest education level: Not on file  Occupational History  . Not on file  Tobacco Use  . Smoking status: Never Smoker  . Smokeless tobacco: Never Used  Substance and Sexual Activity  . Alcohol use: No    Alcohol/week: 0.0 standard drinks  . Drug use: No  . Sexual activity: Yes    Birth control/protection: Other-see comments, Surgical    Comment:  boyfriend  Other Topics Concern  . Not on file  Social History Narrative  . Not on file   Social Determinants of Health   Financial Resource Strain: Low Risk   . Difficulty of Paying Living Expenses: Not hard at all  Food Insecurity:   . Worried About Charity fundraiser in the Last Year: Not on file  . Ran Out of Food in the Last Year: Not on file  Transportation Needs:   . Lack of Transportation (Medical): Not on file  . Lack of Transportation (Non-Medical): Not on file  Physical Activity:   . Days of Exercise per Week: Not on file  . Minutes of Exercise per Session: Not on file  Stress:   . Feeling of Stress : Not on file  Social Connections:   . Frequency of  Communication with Friends and Family: Not on file  . Frequency of Social Gatherings with Friends and Family: Not on file  . Attends Religious Services: Not on file  . Active Member of Clubs or Organizations: Not on file  . Attends Archivist Meetings: Not on file  . Marital Status: Not on file  Intimate Partner Violence:   . Fear of Current or Ex-Partner: Not on file  . Emotionally Abused: Not on file  . Physically Abused: Not on file  . Sexually Abused: Not on file    Past Surgical History:  Procedure Laterality Date  . ABDOMINAL HYSTERECTOMY     took right ovary and uterus  . APPENDECTOMY    . CARPAL TUNNEL RELEASE Right   . CESAREAN SECTION    .  CHOLECYSTECTOMY    . EYE SURGERY Bilateral 2017   cataracts  . KNEE ARTHROPLASTY      Family History  Problem Relation Age of Onset  . Arthritis Mother   . Transient ischemic attack Mother   . Hypertension Mother   . Hyperlipidemia Mother   . Diabetes Father   . Heart disease Father   . Arthritis Father   . Kidney disease Father   . Hypertension Father   . Hyperlipidemia Father   . Cancer Sister 6       pancreatic  . Fibromyalgia Sister   . Asthma Maternal Grandfather        smoker  . Depression Maternal Grandfather   . Dementia Paternal Grandmother   . Diabetes Paternal Grandfather   . Hypertension Brother   . Scoliosis Daughter        had rods placed and removed  . Arthritis Sister   . Gout Sister   . GER disease Sister   . Cancer Sister        breast cancer  . Breast cancer Sister 33    Allergies  Allergen Reactions  . Levaquin [Levofloxacin] Shortness Of Breath  . Clindamycin/Lincomycin     Pruritus No rash  . Penicillins     Positive on allergy testing 'years ago.' Has never had penicillin. Has tolerated amoxicillin.  . Codeine Itching    Current Outpatient Medications on File Prior to Visit  Medication Sig Dispense Refill  . acetaminophen (TYLENOL) 325 MG tablet Take 650 mg by mouth every 6 (six) hours as needed.    Marland Kitchen albuterol (PROAIR HFA) 108 (90 Base) MCG/ACT inhaler Inhale 2 inhaltions TID prn 8.5 g 0  . b complex-C-folic acid 1 MG capsule Take 1 capsule by mouth daily.    . Cholecalciferol ([employer] VIT D 5000 HIGH-POTENCY PO) Take 5,000 Units by mouth.    . diclofenac sodium (VOLTAREN) 1 % GEL Apply 2 g topically 4 (four) times daily. 3 Tube 1  . fluticasone (FLONASE) 50 MCG/ACT nasal spray SHAKE LIQUID AND USE 2 SPRAYS IN EACH NOSTRIL DAILY 16 g 3  . hyoscyamine (LEVSIN SL) 0.125 MG SL tablet Place 1-3 tablets (0.125-0.375 mg total) under the tongue 2 (two) times daily as needed for cramping. 30 tablet 03  . loratadine (CLARITIN) 10 MG tablet Take 10  mg by mouth daily.    . montelukast (SINGULAIR) 10 MG tablet Take 1 tablet (10 mg total) by mouth daily as needed. 90 tablet 1  . OVER THE COUNTER MEDICATION Potassium    . SYNTHROID 150 MCG tablet TAKE 1 TABLET(150 MCG) BY MOUTH DAILY BEFORE BREAKFAST 90 tablet 1  . triamterene-hydrochlorothiazide (MAXZIDE) 75-50 MG tablet TAKE 1  TABLET BY MOUTH DAILY 90 tablet 1   No current facility-administered medications on file prior to visit.    BP 136/71 (BP Location: Left Arm, Patient Position: Sitting, Cuff Size: Normal)   Pulse 72   Temp 97.7 F (36.5 C) (Temporal)   Resp 18   Wt 206 lb 9.6 oz (93.7 kg)   SpO2 100%   BMI 37.79 kg/m       Objective:   Physical Exam  General Appearance- Not in acute distress.    Chest and Lung Exam Auscultation: Breath sounds:-Normal. Clear even and unlabored. Adventitious sounds:- No Adventitious sounds.  Cardiovascular Auscultation:Rythm - Regular, rate and rythm. Heart Sounds -Normal heart sounds.  Abdomen Inspection:-Inspection Normal.  Palpation/Perucssion: Palpation and Percussion of the abdomen reveal- Non Tender, No Rebound tenderness, No rigidity(Guarding) and No Palpable abdominal masses.  Liver:-Normal.  Spleen:- Normal.   Back Mid lumbar spine tenderness to palpation. Rt si tenderness to palpation.  Lower ext neurologic  L5-S1 sensation intact bilaterally. Normal patellar reflexes bilaterally. No foot drop bilaterally.      Assessment & Plan:  You do have 1 month of lower back pain with right sciatica type features.  We gave you Toradol 60 mg IM injection today.  Starting 4-day taper dose of prednisone tomorrow.  Also sent in gabapentin 100 mg 3 times daily prescription.  However please follow the titration schedule we discussed today on interview.  Future lumbar spine x-ray order to be done early next week.  Follow-up in 10 to 14 days or as needed.

## 2019-08-13 ENCOUNTER — Other Ambulatory Visit: Payer: Self-pay

## 2019-08-13 ENCOUNTER — Ambulatory Visit (HOSPITAL_BASED_OUTPATIENT_CLINIC_OR_DEPARTMENT_OTHER)
Admission: RE | Admit: 2019-08-13 | Discharge: 2019-08-13 | Disposition: A | Discharge status: Home / Self Care | Payer: Medicare Other | Source: Ambulatory Visit | Attending: Medical | Admitting: Medical

## 2019-08-13 DIAGNOSIS — M545 Low back pain, unspecified: Secondary | ICD-10-CM

## 2019-08-16 ENCOUNTER — Encounter: Payer: Self-pay | Admitting: Medical

## 2019-08-22 ENCOUNTER — Encounter: Payer: Self-pay | Admitting: Family Medicine

## 2019-08-22 ENCOUNTER — Other Ambulatory Visit: Payer: Self-pay

## 2019-08-22 ENCOUNTER — Ambulatory Visit (INDEPENDENT_AMBULATORY_CARE_PROVIDER_SITE_OTHER): Payer: Medicare Other | Admitting: Family Medicine

## 2019-08-22 VITALS — BP 140/88 | HR 110 | Temp 100.2°F | Ht 62.0 in | Wt 203.4 lb

## 2019-08-22 DIAGNOSIS — R05 Cough: Secondary | ICD-10-CM | POA: Diagnosis not present

## 2019-08-22 DIAGNOSIS — J0140 Acute pansinusitis, unspecified: Secondary | ICD-10-CM

## 2019-08-22 DIAGNOSIS — R059 Cough, unspecified: Secondary | ICD-10-CM

## 2019-08-22 MED ORDER — BENZONATATE 100 MG PO CAPS: ORAL_CAPSULE | ORAL | 0 refills | Status: DC

## 2019-08-22 MED ORDER — DOXYCYCLINE HYCLATE 100 MG PO TABS: 100.0000 mg | ORAL_TABLET | Freq: Two times a day (BID) | ORAL | 0 refills | Status: DC

## 2019-08-22 NOTE — Progress Notes (Signed)
Virtual Visit via Video Note  I connected with Monica Wallace on 08/22/19 at  3:20 PM EST by a video enabled telemedicine application and verified that I am speaking with the correct person using two identifiers.  Location: Patient: home alone  Provider: office    I discussed the limitations of evaluation and management by telemedicine and the availability of in person appointments. The patient expressed understanding and agreed to proceed.  History of Present Illness: Pt is home c/o cough, st and congestion   Symptoms x few weeks.   +    Past Medical History:  Diagnosis Date  . Arthritis   . Baker's cyst of knee, right 02/16/2017  . Cancer ([place])    thyroid (2002)  . Cystic breast   . GERD (gastroesophageal reflux disease)   . Hepatic cyst 02/02/2015  . Hyperlipidemia   . Hypertension   . Insomnia 07/23/2016  . Menopause   . Migraine   . Palpitations   . Preventative health care 01/18/2016  . Sleep apnea 02/02/2015  . Thyroid disease    Current Outpatient Medications on File Prior to Visit  Medication Sig Dispense Refill  . acetaminophen (TYLENOL) 325 MG tablet Take 650 mg by mouth every 6 (six) hours as needed.    Marland Kitchen albuterol (PROAIR HFA) 108 (90 Base) MCG/ACT inhaler Inhale 2 inhaltions TID prn 8.5 g 0  . b complex-C-folic acid 1 MG capsule Take 1 capsule by mouth daily.    . Cholecalciferol ([employer]S VIT D 5000 HIGH-POTENCY PO) Take 5,000 Units by mouth.    . diclofenac sodium (VOLTAREN) 1 % GEL Apply 2 g topically 4 (four) times daily. 3 Tube 1  . fluticasone (FLONASE) 50 MCG/ACT nasal spray SHAKE LIQUID AND USE 2 SPRAYS IN EACH NOSTRIL DAILY 16 g 3  . hyoscyamine (LEVSIN SL) 0.125 MG SL tablet Place 1-3 tablets (0.125-0.375 mg total) under the tongue 2 (two) times daily as needed for cramping. 30 tablet 03  . loratadine (CLARITIN) 10 MG tablet Take 10 mg by mouth daily.    . montelukast (SINGULAIR) 10 MG tablet Take 1 tablet (10 mg total) by mouth daily as needed. 90 tablet 1  . OVER  THE COUNTER MEDICATION Potassium    . SYNTHROID 150 MCG tablet TAKE 1 TABLET(150 MCG) BY MOUTH DAILY BEFORE BREAKFAST 90 tablet 1  . triamterene-hydrochlorothiazide (MAXZIDE) 75-50 MG tablet TAKE 1 TABLET BY MOUTH DAILY 90 tablet 1  . gabapentin (NEURONTIN) 100 MG capsule Take 1 capsule (100 mg total) by mouth 3 (three) times daily. (Patient not taking: Reported on 08/22/2019) 90 capsule 0   No current facility-administered medications on file prior to visit.   Allergies  Allergen Reactions  . Levaquin [Levofloxacin] Shortness Of Breath  . Clindamycin/Lincomycin     Pruritus No rash  . Penicillins     Positive on allergy testing 'years ago.' Has never had penicillin. Has tolerated amoxicillin.  . Codeine Itching   Social History   Socioeconomic History  . Marital status: Divorced    Spouse name: Not on file  . Number of children: Not on file  . Years of education: Not on file  . Highest education level: Not on file  Occupational History  . Not on file  Tobacco Use  . Smoking status: Never Smoker  . Smokeless tobacco: Never Used  Substance and Sexual Activity  . Alcohol use: No    Alcohol/week: 0.0 standard drinks  . Drug use: No  . Sexual activity: Yes  Birth control/protection: Other-see comments, Surgical    Comment:  boyfriend  Other Topics Concern  . Not on file  Social History Narrative  . Not on file   Social Determinants of Health   Financial Resource Strain: Low Risk   . Difficulty of Paying Living Expenses: Not hard at all  Food Insecurity:   . Worried About Charity fundraiser in the Last Year: Not on file  . Ran Out of Food in the Last Year: Not on file  Transportation Needs:   . Lack of Transportation (Medical): Not on file  . Lack of Transportation (Non-Medical): Not on file  Physical Activity:   . Days of Exercise per Week: Not on file  . Minutes of Exercise per Session: Not on file  Stress:   . Feeling of Stress : Not on file  Social  Connections:   . Frequency of Communication with Friends and Family: Not on file  . Frequency of Social Gatherings with Friends and Family: Not on file  . Attends Religious Services: Not on file  . Active Member of Clubs or Organizations: Not on file  . Attends Archivist Meetings: Not on file  . Marital Status: Not on file  Intimate Partner Violence:   . Fear of Current or Ex-Partner: Not on file  . Emotionally Abused: Not on file  . Physically Abused: Not on file  . Sexually Abused: Not on file   Observations/Objective: Vitals:   08/22/19 1508  BP: 140/88  Pulse: (!) 110  Temp: 100.2 F (37.9 C)  SpO2: 100%   Pt is in NAD Assessment and Plan: 1. Acute non-recurrent pansinusitis con't flonase and claritin Pt to get covid test abx per orders and tessalon  Call back if symptoms worsen or do not improve - doxycycline (VIBRA-TABS) 100 MG tablet; Take 1 tablet (100 mg total) by mouth 2 (two) times daily.  Dispense: 20 tablet; Refill: 0  2. Cough  - benzonatate (TESSALON) 100 MG capsule; 1-2 tid prn cough  Dispense: 30 capsule; Refill: 0   Follow Up Instructions:    I discussed the assessment and treatment plan with the patient. The patient was provided an opportunity to ask questions and all were answered. The patient agreed with the plan and demonstrated an understanding of the instructions.   The patient was advised to call back or seek an in-person evaluation if the symptoms worsen or if the condition fails to improve as anticipated.  I provided 15 minutes of non-face-to-face time during this encounter.   Ann Held, DO

## 2019-08-23 ENCOUNTER — Encounter: Payer: Self-pay | Admitting: Family Medicine

## 2019-08-23 DIAGNOSIS — U071 COVID-19: Secondary | ICD-10-CM | POA: Diagnosis not present

## 2019-08-30 ENCOUNTER — Encounter: Payer: Self-pay | Admitting: Family Medicine

## 2019-09-02 ENCOUNTER — Ambulatory Visit (INDEPENDENT_AMBULATORY_CARE_PROVIDER_SITE_OTHER): Payer: Medicare Other | Admitting: Medical

## 2019-09-02 ENCOUNTER — Other Ambulatory Visit: Payer: Self-pay

## 2019-09-02 VITALS — HR 69 | Temp 97.4°F | Wt 206.8 lb

## 2019-09-02 DIAGNOSIS — J0140 Acute pansinusitis, unspecified: Secondary | ICD-10-CM | POA: Diagnosis not present

## 2019-09-02 DIAGNOSIS — R059 Cough, unspecified: Secondary | ICD-10-CM

## 2019-09-02 DIAGNOSIS — R05 Cough: Secondary | ICD-10-CM | POA: Diagnosis not present

## 2019-09-02 MED ORDER — HYDROCODONE-HOMATROPINE 5-1.5 MG/5ML PO SYRP: 5.0000 mL | ORAL_SOLUTION | Freq: Four times a day (QID) | ORAL | 0 refills | Status: DC | PRN

## 2019-09-02 MED ORDER — DOXYCYCLINE HYCLATE 100 MG PO TABS: 100.0000 mg | ORAL_TABLET | Freq: Two times a day (BID) | ORAL | 0 refills | Status: DC

## 2019-09-02 NOTE — Progress Notes (Signed)
   Subjective:    Patient ID: Khylin Guyot, female    DOB: 10/30/1950, 69 y.o.   MRN: [id number]  HPI Virtual Visit via Video Note  I connected with Myrtice Lauth on 09/02/19 at  2:00 PM EST by a video enabled telemedicine application and verified that I am speaking with the correct person using two identifiers.  Location: Patient: home Provider: office   I discussed the limitations of evaluation and management by telemedicine and the availability of in person appointments. The patient expressed understanding and agreed to proceed.  History of Present Illness:  Pt states she got covid early February. Pt states she had some severe sinus pressure symptoms around Aug 20, 2018(she was feeling mild nasal congestion at very end of January). Pt had virtual visit with Dr. Etter Sjogren last week. She tested + on February 5th.   Pt states throat is sore, a lot of pnd and has some severe moderate to severe cough.   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 Both parents tested positive after Remy tested positive.  Pt did start doxycycline and about to finish course. Pt started doxyc on 08-22-2019. Still has cough.     Observations/Objective: General- no acute distress, pleasant, alert and oriented. Sounds nasal congested but otherwise normal speech.  Assessment and Plan: You had sinus infectious symptom originally before dx with covid. Now some bronchitis vs possible pneumonia symptoms about 2 weeks post onset of original signs/symptoms. 10 days since test +.  Doxycycline rx  for 3 days.  Hycodan cough syrup(rx advisement given. Prior hydrocodone use not reaction. Cough med rx advisment. Cxr this Wednesday morning.  My chart  Work note sent. Return to work date pending cxr and clinical response to above tx.  Follow up in 7 days or as needed  Follow Up Instructions:    I discussed the assessment and treatment plan with the patient. The patient was provided an opportunity to ask questions  and all were answered. The patient agreed with the plan and demonstrated an understanding of the instructions.   The patient was advised to call back or seek an in-person evaluation if the symptoms worsen or if the condition fails to improve as anticipated.  I provided  minutes of non-face-to-face time during this encounter.   Mackie Pai, PA-C    Review of Systems  Constitutional: Negative for chills, fatigue and fever.  HENT: Positive for sore throat. Negative for congestion, ear discharge, sinus pressure and sinus pain.   Respiratory: Positive for cough. Negative for chest tightness, shortness of breath and wheezing.   Cardiovascular: Negative for chest pain and palpitations.  Gastrointestinal: Negative for abdominal pain, blood in stool and diarrhea.  Musculoskeletal: Negative for back pain.  Skin: Negative for rash.  Neurological: Positive for light-headedness. Negative for dizziness, syncope, speech difficulty and weakness.       Mild still. Has had through.  Hematological: Negative for adenopathy. Does not bruise/bleed easily.  Psychiatric/Behavioral: Negative for behavioral problems and dysphoric mood.       Objective:   Physical Exam        Assessment & Plan:

## 2019-09-02 NOTE — Patient Instructions (Addendum)
You had sinus infectious symptom originally before dx with covid. Now some bronchitis vs possible pneumonia symptoms about 2 weeks post onset of original signs/symptoms. 10 days since test +.  Doxycycline rx  for 3 days.  Hycodan cough syrup(rx advisement given. Prior hydrocodone use not reaction. Cough med rx advisment. Cxr this Wednesday morning.  My chart  Work note sent. Return to work date pending cxr and clinical response to above tx.  Follow up in 7 days or as needed

## 2019-09-03 ENCOUNTER — Telehealth: Payer: Self-pay | Admitting: Family Medicine

## 2019-09-03 ENCOUNTER — Telehealth: Payer: Self-pay | Admitting: Medical

## 2019-09-03 MED ORDER — HYDROCODONE-HOMATROPINE 5-1.5 MG/5ML PO SYRP: 5.0000 mL | ORAL_SOLUTION | Freq: Four times a day (QID) | ORAL | 0 refills | Status: DC | PRN

## 2019-09-03 NOTE — Telephone Encounter (Signed)
Resent hycodan since other rx for hycodan did not go through electronically? Pharmacy notified us. Awilda Metro gave me update.

## 2019-09-03 NOTE — Telephone Encounter (Signed)
Resent hycodan.

## 2019-09-03 NOTE — Telephone Encounter (Signed)
Can you resend cough syrup to pharmacy?

## 2019-09-03 NOTE — Telephone Encounter (Signed)
Patient has a virtual visit with edward.   pharmacy called and states that medicine is stuck in there system will not let them dispense medication, can we send in another prescription for same.   Naaman Plummer called from [phone] [facility]

## 2019-09-04 ENCOUNTER — Other Ambulatory Visit: Payer: Self-pay | Admitting: Family Medicine

## 2019-09-04 ENCOUNTER — Ambulatory Visit (HOSPITAL_BASED_OUTPATIENT_CLINIC_OR_DEPARTMENT_OTHER)
Admission: RE | Admit: 2019-09-04 | Discharge: 2019-09-04 | Disposition: A | Discharge status: Home / Self Care | Payer: Medicare Other | Source: Ambulatory Visit | Attending: Medical | Admitting: Medical

## 2019-09-04 ENCOUNTER — Other Ambulatory Visit: Payer: Self-pay

## 2019-09-04 ENCOUNTER — Other Ambulatory Visit: Payer: Self-pay | Admitting: *Deleted

## 2019-09-04 DIAGNOSIS — R05 Cough: Secondary | ICD-10-CM | POA: Insufficient documentation

## 2019-09-04 DIAGNOSIS — R059 Cough, unspecified: Secondary | ICD-10-CM

## 2019-09-04 NOTE — Progress Notes (Signed)
Patient viewed results via my chart

## 2019-09-27 ENCOUNTER — Other Ambulatory Visit: Payer: Self-pay

## 2019-09-30 ENCOUNTER — Telehealth: Payer: Self-pay | Admitting: *Deleted

## 2019-09-30 ENCOUNTER — Other Ambulatory Visit: Payer: Self-pay | Admitting: Family Medicine

## 2019-09-30 ENCOUNTER — Ambulatory Visit (INDEPENDENT_AMBULATORY_CARE_PROVIDER_SITE_OTHER): Payer: Medicare Other | Admitting: Family Medicine

## 2019-09-30 ENCOUNTER — Other Ambulatory Visit: Payer: Self-pay

## 2019-09-30 DIAGNOSIS — E890 Postprocedural hypothyroidism: Secondary | ICD-10-CM | POA: Diagnosis not present

## 2019-09-30 DIAGNOSIS — H6691 Otitis media, unspecified, right ear: Secondary | ICD-10-CM

## 2019-09-30 DIAGNOSIS — I10 Essential (primary) hypertension: Secondary | ICD-10-CM | POA: Diagnosis not present

## 2019-09-30 DIAGNOSIS — U071 COVID-19: Secondary | ICD-10-CM | POA: Diagnosis not present

## 2019-09-30 DIAGNOSIS — E119 Type 2 diabetes mellitus without complications: Secondary | ICD-10-CM

## 2019-09-30 DIAGNOSIS — E782 Mixed hyperlipidemia: Secondary | ICD-10-CM | POA: Diagnosis not present

## 2019-09-30 DIAGNOSIS — M25561 Pain in right knee: Secondary | ICD-10-CM | POA: Insufficient documentation

## 2019-09-30 DIAGNOSIS — M25551 Pain in right hip: Secondary | ICD-10-CM | POA: Diagnosis not present

## 2019-09-30 DIAGNOSIS — J309 Allergic rhinitis, unspecified: Secondary | ICD-10-CM

## 2019-09-30 DIAGNOSIS — H6690 Otitis media, unspecified, unspecified ear: Secondary | ICD-10-CM | POA: Insufficient documentation

## 2019-09-30 MED ORDER — METHYLPREDNISOLONE 4 MG PO TABS: ORAL_TABLET | ORAL | 0 refills | Status: DC

## 2019-09-30 MED ORDER — TIZANIDINE HCL 2 MG PO TABS: 1.0000 mg | ORAL_TABLET | Freq: Two times a day (BID) | ORAL | 1 refills | Status: DC | PRN

## 2019-09-30 MED ORDER — ALBUTEROL SULFATE HFA 108 (90 BASE) MCG/ACT IN AERS: 2.0000 | INHALATION_SPRAY | Freq: Four times a day (QID) | RESPIRATORY_TRACT | 0 refills | Status: DC | PRN

## 2019-09-30 MED ORDER — DOXYCYCLINE HYCLATE 100 MG PO TABS: 100.0000 mg | ORAL_TABLET | Freq: Two times a day (BID) | ORAL | 0 refills | Status: DC

## 2019-09-30 NOTE — Telephone Encounter (Signed)
Patient unable to schedule appointment at time of call.   Needs appointment for lab next week and appointment for CPE in the summer.  Patient stated that she will call back.

## 2019-09-30 NOTE — Assessment & Plan Note (Signed)
She has recovered well and is back at work. No persistent symptoms except some head congestion and recent flare of right ear discomfort.  Omron Blood Pressure cuff, upper arm, want BP 100-140/60-90 Pulse oximeter, want oxygen in 90s  Weekly vitals  Take Multivitamin with minerals, selenium Vitamin D 1000-2000 IU daily Probiotic with lactobacillus and bifidophilus Asprin EC 81 mg daily  Melatonin 2-5 mg at bedtime  [email or web address]

## 2019-09-30 NOTE — Assessment & Plan Note (Signed)
Right sided seems to be improving but is given a course of Doxycycline if it worsens and offered an ENT referral if persists.

## 2019-09-30 NOTE — Assessment & Plan Note (Signed)
Encouraged heart healthy diet, increase exercise, avoid trans fats, consider a krill oil cap daily 

## 2019-09-30 NOTE — Progress Notes (Signed)
Virtual Visit via phone Note  I connected with Monica Wallace on 09/30/19 at  9:00 AM EDT by a phone enabled telemedicine application and verified that I am speaking with the correct person using two identifiers.  Location: Patient: home Provider: office   I discussed the limitations of evaluation and management by telemedicine and the availability of in person appointments. The patient expressed understanding and agreed to proceed. Geanie Berlin, CMA was able to get the patient set up on a phone visit after being unable to set up a video visit   Subjective:    Patient ID: Monica Wallace, female    DOB: June 17, 1951, 69 y.o.   MRN: [id number]  Chief Complaint  Patient presents with  . FOLLOW UP COVID    DOING BETTER    HPI Patient is in today for follow up on chronic medical concerns. She was diagnosed with COVID in January she had a temp of 100.1, cough, sinus pressure, PND. She felt very weak and unsteady but she feels better now except some mild fatigue. She is now back at work for past 2-3 weeks and doing well. No falls or new concerns. No recent hospitalizations. No c/o polyuria or polydipsia. Denies CP/palp/SOB/HA/fevers/GI or GU c/o. Taking meds as prescriptions. She recently has had to take Gabapentin for an episode of sciatica but she had facial itching so she had to stop it. She continues to have some pain in right hip still but does not keep her up. Can radiate to knee at times. No incontinence. She is noting some flare in her allergies recently with sinus pressure and pain, she has some right sided ear pain. No fevers, chills.  Past Medical History:  Diagnosis Date  . Arthritis   . Baker's cyst of knee, right 02/16/2017  . Cancer ([place])    thyroid (2002)  . Cystic breast   . GERD (gastroesophageal reflux disease)   . Hepatic cyst 02/02/2015  . Hyperlipidemia   . Hypertension   . Insomnia 07/23/2016  . Menopause   . Migraine   . Palpitations   . Preventative health care  01/18/2016  . Sleep apnea 02/02/2015  . Thyroid disease     Past Surgical History:  Procedure Laterality Date  . ABDOMINAL HYSTERECTOMY     took right ovary and uterus  . APPENDECTOMY    . CARPAL TUNNEL RELEASE Right   . CESAREAN SECTION    . CHOLECYSTECTOMY    . EYE SURGERY Bilateral 2017   cataracts  . KNEE ARTHROPLASTY      Family History  Problem Relation Age of Onset  . Arthritis Mother   . Transient ischemic attack Mother   . Hypertension Mother   . Hyperlipidemia Mother   . Diabetes Father   . Heart disease Father   . Arthritis Father   . Kidney disease Father   . Hypertension Father   . Hyperlipidemia Father   . Cancer Sister 49       pancreatic  . Fibromyalgia Sister   . Asthma Maternal Grandfather        smoker  . Depression Maternal Grandfather   . Dementia Paternal Grandmother   . Diabetes Paternal Grandfather   . Hypertension Brother   . Scoliosis Daughter        had rods placed and removed  . Arthritis Sister   . Gout Sister   . GER disease Sister   . Cancer Sister        breast cancer  . Breast cancer  Sister 76    Social History   Socioeconomic History  . Marital status: Divorced    Spouse name: Not on file  . Number of children: Not on file  . Years of education: Not on file  . Highest education level: Not on file  Occupational History  . Not on file  Tobacco Use  . Smoking status: Never Smoker  . Smokeless tobacco: Never Used  Substance and Sexual Activity  . Alcohol use: No    Alcohol/week: 0.0 standard drinks  . Drug use: No  . Sexual activity: Yes    Birth control/protection: Other-see comments, Surgical    Comment:  boyfriend  Other Topics Concern  . Not on file  Social History Narrative  . Not on file   Social Determinants of Health   Financial Resource Strain: Low Risk   . Difficulty of Paying Living Expenses: Not hard at all  Food Insecurity:   . Worried About Charity fundraiser in the Last Year:   . Arboriculturist  in the Last Year:   Transportation Needs:   . Film/video editor (Medical):   Marland Kitchen Lack of Transportation (Non-Medical):   Physical Activity:   . Days of Exercise per Week:   . Minutes of Exercise per Session:   Stress:   . Feeling of Stress :   Social Connections:   . Frequency of Communication with Friends and Family:   . Frequency of Social Gatherings with Friends and Family:   . Attends Religious Services:   . Active Member of Clubs or Organizations:   . Attends Archivist Meetings:   Marland Kitchen Marital Status:   Intimate Partner Violence:   . Fear of Current or Ex-Partner:   . Emotionally Abused:   Marland Kitchen Physically Abused:   . Sexually Abused:     Outpatient Medications Prior to Visit  Medication Sig Dispense Refill  . acetaminophen (TYLENOL) 325 MG tablet Take 650 mg by mouth every 6 (six) hours as needed.    . Cholecalciferol ([employer]S VIT D 5000 HIGH-POTENCY PO) Take 5,000 Units by mouth.    . diclofenac sodium (VOLTAREN) 1 % GEL Apply 2 g topically 4 (four) times daily. 3 Tube 1  . fluticasone (FLONASE) 50 MCG/ACT nasal spray SHAKE LIQUID AND USE 2 SPRAYS IN EACH NOSTRIL DAILY 48 g 11  . loratadine (CLARITIN) 10 MG tablet Take 10 mg by mouth daily.    . montelukast (SINGULAIR) 10 MG tablet Take 1 tablet (10 mg total) by mouth daily as needed. 90 tablet 1  . SYNTHROID 150 MCG tablet TAKE 1 TABLET(150 MCG) BY MOUTH DAILY BEFORE BREAKFAST 90 tablet 1  . triamterene-hydrochlorothiazide (MAXZIDE) 75-50 MG tablet TAKE 1 TABLET BY MOUTH DAILY 90 tablet 1  . b complex-C-folic acid 1 MG capsule Take 1 capsule by mouth daily.    . benzonatate (TESSALON) 100 MG capsule 1-2 tid prn cough 30 capsule 0  . doxycycline (VIBRA-TABS) 100 MG tablet Take 1 tablet (100 mg total) by mouth 2 (two) times daily. 20 tablet 0  . doxycycline (VIBRA-TABS) 100 MG tablet Take 1 tablet (100 mg total) by mouth 2 (two) times daily. 6 tablet 0  . HYDROcodone-homatropine (HYCODAN) 5-1.5 MG/5ML syrup Take 5 mLs by  mouth every 6 (six) hours as needed for cough. Resent since informed failure to send prior. 100 mL 0   No facility-administered medications prior to visit.    Allergies  Allergen Reactions  . Levaquin [Levofloxacin] Shortness Of Breath  .  Clindamycin/Lincomycin     Pruritus No rash  . Gabapentin Itching  . Penicillins     Positive on allergy testing 'years ago.' Has never had penicillin. Has tolerated amoxicillin.  . Codeine Itching    Review of Systems  Constitutional: Negative for fever and malaise/fatigue.  HENT: Positive for congestion.   Eyes: Negative for blurred vision.  Respiratory: Positive for cough and sputum production. Negative for shortness of breath.   Cardiovascular: Negative for chest pain, palpitations and leg swelling.  Gastrointestinal: Negative for abdominal pain, blood in stool and nausea.  Genitourinary: Negative for dysuria and frequency.  Musculoskeletal: Negative for falls.  Skin: Negative for rash.  Neurological: Negative for dizziness, loss of consciousness and headaches.  Endo/Heme/Allergies: Negative for environmental allergies.  Psychiatric/Behavioral: Negative for depression. The patient is not nervous/anxious.        Objective:    Physical Exam Vitals and nursing note reviewed.  Constitutional:      General: She is not in acute distress.    Appearance: She is well-developed.  HENT:     Head: Normocephalic and atraumatic.     Nose: Nose normal.  Eyes:     General:        Right eye: No discharge.        Left eye: No discharge.  Cardiovascular:     Rate and Rhythm: Normal rate and regular rhythm.     Heart sounds: No murmur.  Pulmonary:     Effort: Pulmonary effort is normal.     Breath sounds: Normal breath sounds.  Abdominal:     General: Bowel sounds are normal.     Palpations: Abdomen is soft.     Tenderness: There is no abdominal tenderness.  Musculoskeletal:     Cervical back: Normal range of motion and neck supple.   Skin:    General: Skin is warm and dry.  Neurological:     Mental Status: She is alert and oriented to person, place, and time.     BP 122/80   Pulse 80   Temp (!) 97.1 F (36.2 C) (Temporal)   Wt 206 lb 9.6 oz (93.7 kg)   SpO2 98%   BMI 37.79 kg/m  Wt Readings from Last 3 Encounters:  09/30/19 206 lb 9.6 oz (93.7 kg)  09/02/19 206 lb 12.8 oz (93.8 kg)  08/22/19 203 lb 6.4 oz (92.3 kg)    Diabetic Foot Exam - Simple   No data filed     Lab Results  Component Value Date   WBC 11.3 (H) 12/13/2018   HGB 13.2 12/13/2018   HCT 39.8 12/13/2018   PLT 320.0 12/13/2018   GLUCOSE 136 (H) 12/13/2018   CHOL 227 (H) 12/13/2018   TRIG 231.0 (H) 12/13/2018   HDL 50.20 12/13/2018   LDLDIRECT 135.0 12/13/2018   LDLCALC 119 (H) 06/05/2018   ALT 22 12/13/2018   AST 24 12/13/2018   NA 139 12/13/2018   K 3.4 (L) 12/13/2018   CL 99 12/13/2018   CREATININE 0.94 12/13/2018   BUN 17 12/13/2018   CO2 30 12/13/2018   TSH 0.61 12/13/2018   HGBA1C 6.6 (H) 12/13/2018    Lab Results  Component Value Date   TSH 0.61 12/13/2018   Lab Results  Component Value Date   WBC 11.3 (H) 12/13/2018   HGB 13.2 12/13/2018   HCT 39.8 12/13/2018   MCV 89.2 12/13/2018   PLT 320.0 12/13/2018   Lab Results  Component Value Date   NA 139 12/13/2018  K 3.4 (L) 12/13/2018   CO2 30 12/13/2018   GLUCOSE 136 (H) 12/13/2018   BUN 17 12/13/2018   CREATININE 0.94 12/13/2018   BILITOT 0.2 12/13/2018   ALKPHOS 83 12/13/2018   AST 24 12/13/2018   ALT 22 12/13/2018   PROT 7.1 12/13/2018   ALBUMIN 3.9 12/13/2018   CALCIUM 9.8 12/13/2018   ANIONGAP 6 09/15/2014   GFR 71.27 12/13/2018   Lab Results  Component Value Date   CHOL 227 (H) 12/13/2018   Lab Results  Component Value Date   HDL 50.20 12/13/2018   Lab Results  Component Value Date   LDLCALC 119 (H) 06/05/2018   Lab Results  Component Value Date   TRIG 231.0 (H) 12/13/2018   Lab Results  Component Value Date   CHOLHDL 5  12/13/2018   Lab Results  Component Value Date   HGBA1C 6.6 (H) 12/13/2018       Assessment & Plan:   Problem List Items Addressed This Visit    Hyperlipidemia    Encouraged heart healthy diet, increase exercise, avoid trans fats, consider a krill oil cap daily      Relevant Orders   Lipid panel   HTN (hypertension)    Well controlled, no changes to meds. Encouraged heart healthy diet such as the DASH diet and exercise as tolerated.       Relevant Orders   Comprehensive metabolic panel   TSH   CBC   Hypothyroidism    On Levothyroxine, continue to monitor      Allergic rhinitis    Continue antihistamines and Singulair prn      Controlled type 2 diabetes mellitus without complication, without long-term current use of insulin (HCC)    hgba1c acceptable, minimize simple carbs. Increase exercise as tolerated.      Relevant Orders   Hemoglobin A1c   Otitis media    Right sided seems to be improving but is given a course of Doxycycline if it worsens and offered an ENT referral if persists.       Relevant Medications   doxycycline (VIBRA-TABS) 100 MG tablet   Hip pain, right    Encouraged moist heat and gentle stretching as tolerated. May try NSAIDs and prescription meds as directed and report if symptoms worsen or seek immediate care. Given Medrol dosepak and Tizanidine. If no relief consider referral to sports med which she declines for now due to work schedule      COVID-19    She has recovered well and is back at work. No persistent symptoms except some head congestion and recent flare of right ear discomfort.  Omron Blood Pressure cuff, upper arm, want BP 100-140/60-90 Pulse oximeter, want oxygen in 90s  Weekly vitals  Take Multivitamin with minerals, selenium Vitamin D 1000-2000 IU daily Probiotic with lactobacillus and bifidophilus Asprin EC 81 mg daily  Melatonin 2-5 mg at bedtime  [email or web address]         I  have discontinued Gowri Fieldhouse's b complex-C-folic acid, doxycycline, benzonatate, doxycycline, and HYDROcodone-homatropine. I am also having her start on methylPREDNISolone, tiZANidine, and doxycycline. Additionally, I am having her maintain her loratadine, Cholecalciferol (CVS VIT D 5000 HIGH-POTENCY PO), acetaminophen, diclofenac sodium, montelukast, Synthroid, triamterene-hydrochlorothiazide, and fluticasone.  Meds ordered this encounter  Medications  . methylPREDNISolone (MEDROL) 4 MG tablet    Sig: 5 tab po qd X 1d then 4 tab po qd X 1d then 3 tab po qd X 1d then 2 tab po qd then 1 tab po  qd    Dispense:  15 tablet    Refill:  0  . tiZANidine (ZANAFLEX) 2 MG tablet    Sig: Take 0.5-2 tablets (1-4 mg total) by mouth 2 (two) times daily as needed for muscle spasms.    Dispense:  30 tablet    Refill:  1  . doxycycline (VIBRA-TABS) 100 MG tablet    Sig: Take 1 tablet (100 mg total) by mouth 2 (two) times daily.    Dispense:  20 tablet    Refill:  0   I discussed the assessment and treatment plan with the patient. The patient was provided an opportunity to ask questions and all were answered. The patient agreed with the plan and demonstrated an understanding of the instructions.    The patient was advised to call back or seek an in-person evaluation if the symptoms worsen or if the condition fails to improve as anticipated.  I provided 25 minutes of non-face-to-face time during this encounter.   [name], MD b

## 2019-09-30 NOTE — Assessment & Plan Note (Signed)
Encouraged moist heat and gentle stretching as tolerated. May try NSAIDs and prescription meds as directed and report if symptoms worsen or seek immediate care. Given Medrol dosepak and Tizanidine. If no relief consider referral to sports med which she declines for now due to work schedule

## 2019-09-30 NOTE — Patient Instructions (Signed)
Omron Blood Pressure cuff, upper arm, want BP 100-140/60-90 Pulse oximeter, want oxygen in 90s  Weekly vitals  Take Multivitamin with minerals, selenium Vitamin D 1000-2000 IU daily Probiotic with lactobacillus and bifidophilus Asprin EC 81 mg daily  Melatonin 2-5 mg at bedtime  [email or web address] 

## 2019-09-30 NOTE — Assessment & Plan Note (Signed)
hgba1c acceptable, minimize simple carbs. Increase exercise as tolerated.  

## 2019-09-30 NOTE — Assessment & Plan Note (Signed)
Well controlled, no changes to meds. Encouraged heart healthy diet such as the DASH diet and exercise as tolerated.  °

## 2019-09-30 NOTE — Assessment & Plan Note (Signed)
On Levothyroxine, continue to monitor 

## 2019-09-30 NOTE — Assessment & Plan Note (Signed)
Continue antihistamines and Singulair prn

## 2019-10-08 ENCOUNTER — Other Ambulatory Visit: Payer: Self-pay

## 2019-10-08 ENCOUNTER — Other Ambulatory Visit (INDEPENDENT_AMBULATORY_CARE_PROVIDER_SITE_OTHER): Payer: Medicare Other

## 2019-10-08 DIAGNOSIS — I10 Essential (primary) hypertension: Secondary | ICD-10-CM

## 2019-10-08 DIAGNOSIS — E782 Mixed hyperlipidemia: Secondary | ICD-10-CM

## 2019-10-08 DIAGNOSIS — E119 Type 2 diabetes mellitus without complications: Secondary | ICD-10-CM | POA: Diagnosis not present

## 2019-10-08 LAB — COMPREHENSIVE METABOLIC PANEL
ALT: 24 U/L (ref 0–35)
AST: 21 U/L (ref 0–37)
Albumin: 4 g/dL (ref 3.5–5.2)
Alkaline Phosphatase: 78 U/L (ref 39–117)
BUN: 17 mg/dL (ref 6–23)
CO2: 34 mEq/L — ABNORMAL HIGH (ref 19–32)
Calcium: 9.7 mg/dL (ref 8.4–10.5)
Chloride: 94 mEq/L — ABNORMAL LOW (ref 96–112)
Creatinine, Ser: 0.91 mg/dL (ref 0.40–1.20)
GFR: 73.82 mL/min (ref >60.00)
Glucose, Bld: 140 mg/dL — ABNORMAL HIGH (ref 70–99)
Potassium: 3.7 mEq/L (ref 3.5–5.1)
Sodium: 135 mEq/L (ref 135–145)
Total Bilirubin: 0.3 mg/dL (ref 0.2–1.2)
Total Protein: 6.8 g/dL (ref 6.0–8.3)

## 2019-10-08 LAB — CBC
HCT: 40.2 % (ref 36.0–46.0)
Hemoglobin: 13.4 g/dL (ref 12.0–15.0)
MCHC: 33.3 g/dL (ref 30.0–36.0)
MCV: 90.1 fl (ref 78.0–100.0)
Platelets: 330 10*3/uL (ref 150.0–400.0)
RBC: 4.46 Mil/uL (ref 3.87–5.11)
RDW: 13.8 % (ref 11.5–15.5)
WBC: 11.3 10*3/uL — ABNORMAL HIGH (ref 4.0–10.5)

## 2019-10-08 LAB — LIPID PANEL
Cholesterol: 197 mg/dL (ref 0–200)
HDL: 52.1 mg/dL (ref >39.00)
NonHDL: 144.44
Total CHOL/HDL Ratio: 4
Triglycerides: 267 mg/dL — ABNORMAL HIGH (ref 0.0–149.0)
VLDL: 53.4 mg/dL — ABNORMAL HIGH (ref 0.0–40.0)

## 2019-10-08 LAB — HEMOGLOBIN A1C: Hgb A1c MFr Bld: 6.4 % (ref 4.6–6.5)

## 2019-10-08 LAB — TSH: TSH: 0.42 u[IU]/mL (ref 0.35–4.50)

## 2019-10-08 LAB — LDL CHOLESTEROL, DIRECT: Direct LDL: 113 mg/dL

## 2019-11-02 ENCOUNTER — Other Ambulatory Visit: Payer: Self-pay | Admitting: Family Medicine

## 2019-11-04 ENCOUNTER — Other Ambulatory Visit: Payer: Self-pay

## 2019-11-04 ENCOUNTER — Ambulatory Visit: Payer: Medicare Other | Admitting: Medical

## 2019-11-04 ENCOUNTER — Ambulatory Visit (INDEPENDENT_AMBULATORY_CARE_PROVIDER_SITE_OTHER): Payer: Medicare Other | Admitting: Family

## 2019-11-04 ENCOUNTER — Encounter: Payer: Self-pay | Admitting: Family

## 2019-11-04 ENCOUNTER — Ambulatory Visit (HOSPITAL_BASED_OUTPATIENT_CLINIC_OR_DEPARTMENT_OTHER)
Admission: RE | Admit: 2019-11-04 | Discharge: 2019-11-04 | Disposition: A | Discharge status: Home / Self Care | Payer: Medicare Other | Source: Ambulatory Visit | Attending: Family | Admitting: Family

## 2019-11-04 VITALS — BP 145/88 | HR 108 | Temp 97.8°F | Resp 16 | Ht 61.5 in | Wt 204.0 lb

## 2019-11-04 DIAGNOSIS — I499 Cardiac arrhythmia, unspecified: Secondary | ICD-10-CM

## 2019-11-04 DIAGNOSIS — M79661 Pain in right lower leg: Secondary | ICD-10-CM | POA: Diagnosis not present

## 2019-11-04 NOTE — Progress Notes (Signed)
Subjective:    Patient ID: Monica Wallace, female    DOB: 06-Jul-1951, 69 y.o.   MRN: [id number]  HPI  Patient is a 69 yr old female with hx of OSA, migraine,  HTN,Hyperlipidemia, and thyroid cancer (back in 2002),  who presents today with chief complaint of right calf pain.  Reports that she has had this pain on and off for months. Reports that she noticed the pain worsened this past week.  Baker's cyst, hx also has had a hx of meniscal tear.   Denies long travel trips, chest pain, sob.  Denies recent injury.  She also has hx of sciatica on the right- this is chronic and currently somewhat improved from her baseline.  Review of Systems See HPI  Past Medical History:  Diagnosis Date  . Arthritis   . Baker's cyst of knee, right 02/16/2017  . Cancer ([place])    thyroid (2002)  . Cystic breast   . GERD (gastroesophageal reflux disease)   . Hepatic cyst 02/02/2015  . Hyperlipidemia   . Hypertension   . Insomnia 07/23/2016  . Menopause   . Migraine   . Palpitations   . Preventative health care 01/18/2016  . Sleep apnea 02/02/2015  . Thyroid disease      Social History   Socioeconomic History  . Marital status: Divorced    Spouse name: Not on file  . Number of children: Not on file  . Years of education: Not on file  . Highest education level: Not on file  Occupational History  . Not on file  Tobacco Use  . Smoking status: Never Smoker  . Smokeless tobacco: Never Used  Substance and Sexual Activity  . Alcohol use: No    Alcohol/week: 0.0 standard drinks  . Drug use: No  . Sexual activity: Yes    Birth control/protection: Other-see comments, Surgical    Comment:  boyfriend  Other Topics Concern  . Not on file  Social History Narrative  . Not on file   Social Determinants of Health   Financial Resource Strain: Low Risk   . Difficulty of Paying Living Expenses: Not hard at all  Food Insecurity:   . Worried About Charity fundraiser in the Last Year:   . Arboriculturist in  the Last Year:   Transportation Needs:   . Film/video editor (Medical):   Marland Kitchen Lack of Transportation (Non-Medical):   Physical Activity:   . Days of Exercise per Week:   . Minutes of Exercise per Session:   Stress:   . Feeling of Stress :   Social Connections:   . Frequency of Communication with Friends and Family:   . Frequency of Social Gatherings with Friends and Family:   . Attends Religious Services:   . Active Member of Clubs or Organizations:   . Attends Archivist Meetings:   Marland Kitchen Marital Status:   Intimate Partner Violence:   . Fear of Current or Ex-Partner:   . Emotionally Abused:   Marland Kitchen Physically Abused:   . Sexually Abused:     Past Surgical History:  Procedure Laterality Date  . ABDOMINAL HYSTERECTOMY     took right ovary and uterus  . APPENDECTOMY    . CARPAL TUNNEL RELEASE Right   . CESAREAN SECTION    . CHOLECYSTECTOMY    . EYE SURGERY Bilateral 2017   cataracts  . KNEE ARTHROPLASTY      Family History  Problem Relation Age of Onset  .  Arthritis Mother   . Transient ischemic attack Mother   . Hypertension Mother   . Hyperlipidemia Mother   . Diabetes Father   . Heart disease Father   . Arthritis Father   . Kidney disease Father   . Hypertension Father   . Hyperlipidemia Father   . Cancer Sister 57       pancreatic  . Fibromyalgia Sister   . Asthma Maternal Grandfather        smoker  . Depression Maternal Grandfather   . Dementia Paternal Grandmother   . Diabetes Paternal Grandfather   . Hypertension Brother   . Scoliosis Daughter        had rods placed and removed  . Arthritis Sister   . Gout Sister   . GER disease Sister   . Cancer Sister        breast cancer  . Breast cancer Sister 52    Allergies  Allergen Reactions  . Levaquin [Levofloxacin] Shortness Of Breath  . Clindamycin/Lincomycin     Pruritus No rash  . Gabapentin Itching  . Penicillins     Positive on allergy testing 'years ago.' Has never had penicillin.  Has tolerated amoxicillin.  . Codeine Itching    Current Outpatient Medications on File Prior to Visit  Medication Sig Dispense Refill  . acetaminophen (TYLENOL) 325 MG tablet Take 650 mg by mouth every 6 (six) hours as needed.    Marland Kitchen albuterol (VENTOLIN HFA) 108 (90 Base) MCG/ACT inhaler INHALE 2 PUFFS INTO THE LUNGS EVERY 6 HOURS AS NEEDED FOR WHEEZING OR SHORTNESS OF BREATH 25.5 g 1  . Cholecalciferol (CVS VIT D 5000 HIGH-POTENCY PO) Take 5,000 Units by mouth.    . diclofenac sodium (VOLTAREN) 1 % GEL Apply 2 g topically 4 (four) times daily. 3 Tube 1  . doxycycline (VIBRA-TABS) 100 MG tablet Take 1 tablet (100 mg total) by mouth 2 (two) times daily. 20 tablet 0  . fluticasone (FLONASE) 50 MCG/ACT nasal spray SHAKE LIQUID AND USE 2 SPRAYS IN EACH NOSTRIL DAILY 48 g 11  . loratadine (CLARITIN) 10 MG tablet Take 10 mg by mouth daily.    . methylPREDNISolone (MEDROL) 4 MG tablet 5 tab po qd X 1d then 4 tab po qd X 1d then 3 tab po qd X 1d then 2 tab po qd then 1 tab po qd 15 tablet 0  . montelukast (SINGULAIR) 10 MG tablet Take 1 tablet (10 mg total) by mouth daily as needed. 90 tablet 3  . SYNTHROID 150 MCG tablet TAKE 1 TABLET(150 MCG) BY MOUTH DAILY BEFORE BREAKFAST 90 tablet 1  . tiZANidine (ZANAFLEX) 2 MG tablet Take 0.5-2 tablets (1-4 mg total) by mouth 2 (two) times daily as needed for muscle spasms. 30 tablet 1  . triamterene-hydrochlorothiazide (MAXZIDE) 75-50 MG tablet TAKE 1 TABLET BY MOUTH DAILY 90 tablet 1   No current facility-administered medications on file prior to visit.    BP (!) 145/88 (BP Location: Right Arm, Patient Position: Sitting, Cuff Size: Small)   Pulse (!) 108   Temp 97.8 F (36.6 C) (Temporal)   Resp 16   Ht 5' 1.5" (1.562 m)   Wt 204 lb (92.5 kg)   SpO2 99%   BMI 37.92 kg/m       Objective:   Physical Exam Constitutional:      Appearance: She is well-developed.  Neck:     Thyroid: No thyromegaly.  Cardiovascular:     Rate and Rhythm: Normal  rate. Rhythm  irregular.     Heart sounds: Normal heart sounds. No murmur.  Pulmonary:     Effort: Pulmonary effort is normal. No respiratory distress.     Breath sounds: Normal breath sounds. No wheezing.  Musculoskeletal:     Cervical back: Neck supple.     Comments: + tenderness to palpation right upper posterior calf.  + homan's sign  No erythema, no swelling. Some spider veins noted on posterior calf  Skin:    General: Skin is warm and dry.  Neurological:     Mental Status: She is alert and oriented to person, place, and time.  Psychiatric:        Behavior: Behavior normal.        Thought Content: Thought content normal.        Judgment: Judgment normal.           Assessment & Plan:  Irregular heart rate-noted on exam today. EKG obtained today and personally interpreted. Notes NSR, mild tachycardia. Pt notes that she just had a "[place]" prior to coming in.   Right Calf pain- rule out DVT. Will obtain LE doppler to further evaluation. She does have a history of baker's cyst which has ruptured before. We discussed that if [place] is negative for clot, she should arrange follow up with her orthopedist.  (Dr. Noemi Chapel).   This visit occurred during the SARS-CoV-2 public health emergency.  Safety protocols were in place, including screening questions prior to the visit, additional usage of staff PPE, and extensive cleaning of exam room while observing appropriate contact time as indicated for disinfecting solutions.

## 2019-11-04 NOTE — Patient Instructions (Signed)
Please return to imaging for 2:30 ultrasound today.

## 2019-11-07 DIAGNOSIS — M7121 Synovial cyst of popliteal space [Baker], right knee: Secondary | ICD-10-CM | POA: Diagnosis not present

## 2019-11-07 DIAGNOSIS — M1711 Unilateral primary osteoarthritis, right knee: Secondary | ICD-10-CM | POA: Diagnosis not present

## 2019-11-29 ENCOUNTER — Other Ambulatory Visit: Payer: Self-pay | Admitting: Family Medicine

## 2019-12-05 ENCOUNTER — Other Ambulatory Visit: Payer: Self-pay | Admitting: *Deleted

## 2019-12-05 MED ORDER — FLUTICASONE PROPIONATE 50 MCG/ACT NA SUSP: 2.0000 | Freq: Every day | NASAL | 1 refills | Status: DC

## 2019-12-18 ENCOUNTER — Other Ambulatory Visit: Payer: Self-pay | Admitting: Family Medicine

## 2020-01-01 ENCOUNTER — Other Ambulatory Visit: Payer: Self-pay

## 2020-01-01 ENCOUNTER — Ambulatory Visit (HOSPITAL_BASED_OUTPATIENT_CLINIC_OR_DEPARTMENT_OTHER)
Admission: RE | Admit: 2020-01-01 | Discharge: 2020-01-01 | Disposition: A | Discharge status: Home / Self Care | Payer: Medicare Other | Source: Ambulatory Visit | Attending: Medical | Admitting: Medical

## 2020-01-01 ENCOUNTER — Encounter: Payer: Self-pay | Admitting: Medical

## 2020-01-01 ENCOUNTER — Ambulatory Visit: Payer: Medicare Other | Admitting: Medical

## 2020-01-01 VITALS — BP 145/86 | Temp 97.7°F | Ht 61.0 in | Wt 206.8 lb

## 2020-01-01 DIAGNOSIS — M542 Cervicalgia: Secondary | ICD-10-CM | POA: Diagnosis not present

## 2020-01-01 DIAGNOSIS — H9201 Otalgia, right ear: Secondary | ICD-10-CM

## 2020-01-01 DIAGNOSIS — K1120 Sialoadenitis, unspecified: Secondary | ICD-10-CM

## 2020-01-01 DIAGNOSIS — J309 Allergic rhinitis, unspecified: Secondary | ICD-10-CM

## 2020-01-01 MED ORDER — DOXYCYCLINE HYCLATE 100 MG PO TABS: 100.0000 mg | ORAL_TABLET | Freq: Two times a day (BID) | ORAL | 0 refills | Status: DC

## 2020-01-01 NOTE — Progress Notes (Signed)
   Subjective:    Patient ID: Monica Wallace, female    DOB: [birth year], 69 y.o.   MRN: [id number]  HPI   Pt in for evaluation.  Pt has history of some ear pain both sides. She states pain is worse on rt side. She points to both sides mastoid area. Also reports some pain in back of neck/trapezius area.   Pt did mention to Dr. Charlett Blake on virtual visit. She got antibiotic. She states with antibiotic mastoid area did feel better.  Pt states that pain is disturbing her sleep.  Pt states in the past for years would have pain that would come and go in rt mastoid area. Pt also reports remote injury in that area. As teenager lacerated area and sutures placed.  Pt does have hx of allergies in past. Some nasal congestion  chronically. Feels pnd a lot. She takes claritin and singulair.  Some rt side parotid area pain. No teeth pain on chewing.   Some base of neck pain. No radiating pain to arms.   Review of Systems  Constitutional: Negative for chills, fatigue and fever.  HENT: Positive for congestion, ear pain and postnasal drip.   Respiratory: Negative for cough, chest tightness and shortness of breath.   Cardiovascular: Negative for chest pain and palpitations.  Gastrointestinal: Negative for abdominal pain, constipation and nausea.  Musculoskeletal: Negative for back pain.  Neurological: Negative for dizziness, weakness and headaches.  Hematological: Positive for adenopathy. Does not bruise/bleed easily.  Psychiatric/Behavioral: Negative for behavioral problems.       Objective:   Physical Exam  General  Mental Status - Alert. General Appearance - Well groomed. Not in acute distress.  Skin Rashes- No Rashes.  HEENT Head- Normal. Ear Auditory Canal - Left- Normal. Right - Normal.Tympanic Membrane- Left- Normal. Right- dull pinkish red.  Eye Sclera/Conjunctiva- Left- Normal. Right- Normal. Nose & Sinuses Nasal Mucosa- Left-  Boggy and Congested. Right-  Boggy and   Congested.Bilateral  No maxillary and no  frontal sinus pressure. Mouth & Throat Lips: Upper Lip- Normal: no dryness, cracking, pallor, cyanosis, or vesicular eruption. Lower Lip-Normal: no dryness, cracking, pallor, cyanosis or vesicular eruption. Buccal Mucosa- Bilateral- No Aphthous ulcers. Oropharynx- No Discharge or Erythema. Tonsils: Characteristics- Bilateral- No Erythema or Congestion. Size/Enlargement- Bilateral- No enlargement. Discharge- bilateral-None.  Neck Neck- Supple. No Masses.   Chest and Lung Exam Auscultation: Breath Sounds:-Clear even and unlabored.  Cardiovascular Auscultation:Rythm- Regular, rate and rhythm. Murmurs & Other Heart Sounds:Ausculatation of the heart reveal- No Murmurs.  Lymphatic Head & Neck General Head & Neck Lymphatics: Bilateral: Description- mild tender submandibular nodes.       Assessment & Plan:  For your history of 2 months right mastoid area pain, ear pain and right region pain, I decided to go ahead and prescribe additional round of doxycycline as that has helped in the past.  Also went ahead and place referral to ENT.  Asking for appointment within 2 to 3weeks to coincide with finishing the antibiotic.  For history of allergic rhinitis, continue Claritin, Singulair and Flonase.  For neck pain in the lower cervical spine region placing cervical spine x-ray today.  Follow-up in approximately 1 month or sooner if needed pending the ENT appointment.  Time spent with patient today was 25  minutes which consisted of chart review, discussing diagnoses, work up, referral,  treatment and documentation.

## 2020-01-01 NOTE — Patient Instructions (Signed)
For your history of 2 months right mastoid area pain, ear pain and right region pain, I decided to go ahead and prescribe additional round of doxycycline as that has helped in the past.  Also went ahead and place referral to ENT.  Asking for appointment within 2 to 3weeks to coincide with finishing the antibiotic.  For history of allergic rhinitis, continue Claritin, Singulair and Flonase.  For neck pain in the lower cervical spine region placing cervical spine x-ray today.  Follow-up in approximately 1 month or sooner if needed pending the ENT appointment.

## 2020-01-13 ENCOUNTER — Emergency Department (HOSPITAL_BASED_OUTPATIENT_CLINIC_OR_DEPARTMENT_OTHER): Payer: Medicare Other

## 2020-01-13 ENCOUNTER — Encounter (HOSPITAL_BASED_OUTPATIENT_CLINIC_OR_DEPARTMENT_OTHER): Payer: Self-pay | Admitting: *Deleted

## 2020-01-13 ENCOUNTER — Inpatient Hospital Stay (HOSPITAL_BASED_OUTPATIENT_CLINIC_OR_DEPARTMENT_OTHER)
Admission: EM | Admit: 2020-01-13 | Discharge: 2020-01-16 | DRG: 203 | Disposition: A | Discharge status: Home / Self Care | Payer: Medicare Other | Attending: Family Medicine | Admitting: Family Medicine

## 2020-01-13 ENCOUNTER — Other Ambulatory Visit: Payer: Self-pay

## 2020-01-13 DIAGNOSIS — Z8616 Personal history of COVID-19: Secondary | ICD-10-CM | POA: Diagnosis not present

## 2020-01-13 DIAGNOSIS — G4733 Obstructive sleep apnea (adult) (pediatric): Secondary | ICD-10-CM | POA: Diagnosis present

## 2020-01-13 DIAGNOSIS — Z881 Allergy status to other antibiotic agents status: Secondary | ICD-10-CM | POA: Diagnosis not present

## 2020-01-13 DIAGNOSIS — J309 Allergic rhinitis, unspecified: Secondary | ICD-10-CM | POA: Diagnosis not present

## 2020-01-13 DIAGNOSIS — Z9049 Acquired absence of other specified parts of digestive tract: Secondary | ICD-10-CM | POA: Diagnosis not present

## 2020-01-13 DIAGNOSIS — G4700 Insomnia, unspecified: Secondary | ICD-10-CM | POA: Diagnosis not present

## 2020-01-13 DIAGNOSIS — E876 Hypokalemia: Secondary | ICD-10-CM | POA: Diagnosis not present

## 2020-01-13 DIAGNOSIS — J069 Acute upper respiratory infection, unspecified: Secondary | ICD-10-CM

## 2020-01-13 DIAGNOSIS — R062 Wheezing: Secondary | ICD-10-CM | POA: Diagnosis not present

## 2020-01-13 DIAGNOSIS — Z90722 Acquired absence of ovaries, bilateral: Secondary | ICD-10-CM

## 2020-01-13 DIAGNOSIS — Z6838 Body mass index (BMI) 38.0-38.9, adult: Secondary | ICD-10-CM

## 2020-01-13 DIAGNOSIS — Z8249 Family history of ischemic heart disease and other diseases of the circulatory system: Secondary | ICD-10-CM

## 2020-01-13 DIAGNOSIS — R7303 Prediabetes: Secondary | ICD-10-CM | POA: Diagnosis not present

## 2020-01-13 DIAGNOSIS — I10 Essential (primary) hypertension: Secondary | ICD-10-CM | POA: Diagnosis present

## 2020-01-13 DIAGNOSIS — Z885 Allergy status to narcotic agent status: Secondary | ICD-10-CM

## 2020-01-13 DIAGNOSIS — Z880 Allergy status to penicillin: Secondary | ICD-10-CM

## 2020-01-13 DIAGNOSIS — Z20822 Contact with and (suspected) exposure to covid-19: Secondary | ICD-10-CM | POA: Diagnosis not present

## 2020-01-13 DIAGNOSIS — Z90710 Acquired absence of both cervix and uterus: Secondary | ICD-10-CM | POA: Diagnosis not present

## 2020-01-13 DIAGNOSIS — Z79899 Other long term (current) drug therapy: Secondary | ICD-10-CM | POA: Diagnosis not present

## 2020-01-13 DIAGNOSIS — E890 Postprocedural hypothyroidism: Secondary | ICD-10-CM | POA: Diagnosis present

## 2020-01-13 DIAGNOSIS — J209 Acute bronchitis, unspecified: Secondary | ICD-10-CM | POA: Diagnosis present

## 2020-01-13 DIAGNOSIS — R000 Tachycardia, unspecified: Secondary | ICD-10-CM | POA: Diagnosis not present

## 2020-01-13 DIAGNOSIS — Z79890 Hormone replacement therapy (postmenopausal): Secondary | ICD-10-CM | POA: Diagnosis not present

## 2020-01-13 DIAGNOSIS — Z833 Family history of diabetes mellitus: Secondary | ICD-10-CM | POA: Diagnosis not present

## 2020-01-13 DIAGNOSIS — Z85850 Personal history of malignant neoplasm of thyroid: Secondary | ICD-10-CM

## 2020-01-13 DIAGNOSIS — I341 Nonrheumatic mitral (valve) prolapse: Secondary | ICD-10-CM | POA: Diagnosis not present

## 2020-01-13 DIAGNOSIS — K219 Gastro-esophageal reflux disease without esophagitis: Secondary | ICD-10-CM | POA: Diagnosis not present

## 2020-01-13 DIAGNOSIS — J329 Chronic sinusitis, unspecified: Secondary | ICD-10-CM | POA: Diagnosis not present

## 2020-01-13 DIAGNOSIS — B9789 Other viral agents as the cause of diseases classified elsewhere: Secondary | ICD-10-CM | POA: Diagnosis not present

## 2020-01-13 DIAGNOSIS — R0789 Other chest pain: Secondary | ICD-10-CM | POA: Diagnosis not present

## 2020-01-13 DIAGNOSIS — Z888 Allergy status to other drugs, medicaments and biological substances status: Secondary | ICD-10-CM

## 2020-01-13 DIAGNOSIS — E785 Hyperlipidemia, unspecified: Secondary | ICD-10-CM | POA: Diagnosis not present

## 2020-01-13 DIAGNOSIS — J3089 Other allergic rhinitis: Secondary | ICD-10-CM | POA: Diagnosis present

## 2020-01-13 DIAGNOSIS — Z825 Family history of asthma and other chronic lower respiratory diseases: Secondary | ICD-10-CM

## 2020-01-13 LAB — BASIC METABOLIC PANEL
Anion gap: 14 (ref 5–15)
BUN: 14 mg/dL (ref 8–23)
CO2: 28 mmol/L (ref 22–32)
Calcium: 9.5 mg/dL (ref 8.9–10.3)
Chloride: 94 mmol/L — ABNORMAL LOW (ref 98–111)
Creatinine, Ser: 0.79 mg/dL (ref 0.44–1.00)
GFR calc Af Amer: >60 mL/min (ref >60)
GFR calc non Af Amer: >60 mL/min (ref >60)
Glucose, Bld: 113 mg/dL — ABNORMAL HIGH (ref 70–99)
Potassium: 3 mmol/L — ABNORMAL LOW (ref 3.5–5.1)
Sodium: 136 mmol/L (ref 135–145)

## 2020-01-13 LAB — CBC WITH DIFFERENTIAL/PLATELET
Abs Immature Granulocytes: 0.05 10*3/uL (ref 0.00–0.07)
Basophils Absolute: 0.1 10*3/uL (ref 0.0–0.1)
Basophils Relative: 1 %
Eosinophils Absolute: 0.2 10*3/uL (ref 0.0–0.5)
Eosinophils Relative: 2 %
HCT: 40.6 % (ref 36.0–46.0)
Hemoglobin: 13.1 g/dL (ref 12.0–15.0)
Immature Granulocytes: 0 %
Lymphocytes Relative: 38 %
Lymphs Abs: 4.5 10*3/uL — ABNORMAL HIGH (ref 0.7–4.0)
MCH: 29.8 pg (ref 26.0–34.0)
MCHC: 32.3 g/dL (ref 30.0–36.0)
MCV: 92.3 fL (ref 80.0–100.0)
Monocytes Absolute: 1.1 10*3/uL — ABNORMAL HIGH (ref 0.1–1.0)
Monocytes Relative: 10 %
Neutro Abs: 5.8 10*3/uL (ref 1.7–7.7)
Neutrophils Relative %: 49 %
Platelets: 329 10*3/uL (ref 150–400)
RBC: 4.4 MIL/uL (ref 3.87–5.11)
RDW: 13.6 % (ref 11.5–15.5)
WBC: 11.8 10*3/uL — ABNORMAL HIGH (ref 4.0–10.5)
nRBC: 0 % (ref 0.0–0.2)

## 2020-01-13 LAB — SARS CORONAVIRUS 2 BY RT PCR (HOSPITAL ORDER, PERFORMED IN ~~LOC~~ HOSPITAL LAB): SARS Coronavirus 2: NEGATIVE

## 2020-01-13 MED ORDER — POTASSIUM CHLORIDE 10 MEQ/100ML IV SOLN
10.0000 meq | Freq: Once | INTRAVENOUS | Status: AC
  Administered 2020-01-13: 10 meq via INTRAVENOUS
  Filled 2020-01-13: qty 100

## 2020-01-13 MED ORDER — MAGNESIUM SULFATE 50 % IJ SOLN
2.0000 g | Freq: Once | INTRAMUSCULAR | Status: DC
  Filled 2020-01-13: qty 4

## 2020-01-13 MED ORDER — SODIUM CHLORIDE 0.9 % IV SOLN
INTRAVENOUS | Status: DC | PRN
  Administered 2020-01-13: 250 mL via INTRAVENOUS

## 2020-01-13 MED ORDER — MAGNESIUM SULFATE 2 GM/50ML IV SOLN
2.0000 g | Freq: Once | INTRAVENOUS | Status: AC
  Administered 2020-01-13: 2 g via INTRAVENOUS
  Filled 2020-01-13: qty 50

## 2020-01-13 MED ORDER — METHYLPREDNISOLONE SODIUM SUCC 125 MG IJ SOLR
80.0000 mg | Freq: Once | INTRAMUSCULAR | Status: AC
  Administered 2020-01-13: 80 mg via INTRAVENOUS
  Filled 2020-01-13: qty 2

## 2020-01-13 MED ORDER — ALBUTEROL SULFATE (2.5 MG/3ML) 0.083% IN NEBU
2.5000 mg | INHALATION_SOLUTION | Freq: Once | RESPIRATORY_TRACT | Status: AC
  Administered 2020-01-13: 2.5 mg via RESPIRATORY_TRACT
  Filled 2020-01-13: qty 3

## 2020-01-13 MED ORDER — ALBUTEROL SULFATE HFA 108 (90 BASE) MCG/ACT IN AERS
4.0000 | INHALATION_SPRAY | Freq: Once | RESPIRATORY_TRACT | Status: AC
  Administered 2020-01-13: 4 via RESPIRATORY_TRACT
  Filled 2020-01-13: qty 6.7

## 2020-01-13 MED ORDER — IPRATROPIUM-ALBUTEROL 0.5-2.5 (3) MG/3ML IN SOLN
3.0000 mL | RESPIRATORY_TRACT | Status: DC | PRN
  Administered 2020-01-13: 3 mL via RESPIRATORY_TRACT
  Filled 2020-01-13: qty 3

## 2020-01-13 MED ORDER — IPRATROPIUM-ALBUTEROL 0.5-2.5 (3) MG/3ML IN SOLN
3.0000 mL | Freq: Once | RESPIRATORY_TRACT | Status: AC
  Administered 2020-01-13: 3 mL via RESPIRATORY_TRACT
  Filled 2020-01-13: qty 3

## 2020-01-13 MED ORDER — AEROCHAMBER PLUS FLO-VU MISC
1.0000 | Freq: Once | Status: AC
  Administered 2020-01-13: 1
  Filled 2020-01-13: qty 1

## 2020-01-13 NOTE — ED Triage Notes (Signed)
Cough, runny nose and wheezing. She has been using her Albuterol inhaler with no relief this am. She is ambulatory and talking in complete sentences.

## 2020-01-13 NOTE — ED Notes (Signed)
Patient ambulated in hall on R/A Hr 110-118, SpO2 98-100%, RR low 20.  Denies DOE.

## 2020-01-13 NOTE — ED Notes (Signed)
Lab notified of blood lab orders

## 2020-01-13 NOTE — ED Provider Notes (Signed)
Monica Wallace Note   CSN: [id number] Arrival date & time: 01/13/20  1045     History Chief Complaint  Patient presents with  . Cough    Monica Wallace is a 69 y.o. female history of GERD, hypertension, hyperlipidemia, asthma, sleep apnea.  Of note type 2 diabetes as listed in patient's past medical history below, she denies history of diabetes.  Patient presents today with concern of URI and wheezing.  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  She reports that the child takes care of developed runny nose and URI symptoms over the past week.  Patient reports as the last 4 days she to has developed rhinorrhea, nonproductive cough and wheezing.  She has been using her albuterol inhaler with minimal relief.  She describes rhinorrhea as clear, cough as mild nonproductive.  She reports wheezing sensation in her upper chest.  She denies any associated chest pain or shortness of breath.  Patient reports that she has had similar problems in the past with bronchitis normally resolves after a course of albuterol and prednisone.  Denies fever/chills, headache, neck pain, chest pain/shortness of breath, hemoptysis, abdominal pain, nausea/vomiting, diarrhea, extremity swelling/color change or any additional concerns.  Of note patient reports she was diagnosed with Covid in April of this year and received Covid vaccine last month. HPI     Past Medical History:  Diagnosis Date  . Arthritis   . Baker's cyst of knee, right 02/16/2017  . Cancer ([place])    thyroid (2002)  . Cystic breast   . GERD (gastroesophageal reflux disease)   . Hepatic cyst 02/02/2015  . Hyperlipidemia   . Hypertension   . Insomnia 07/23/2016  . Menopause   . Migraine   . Palpitations   . Preventative health care 01/18/2016  . Sleep apnea 02/02/2015  . Thyroid disease     Patient Active Problem List   Diagnosis Date Noted  . Otitis media 09/30/2019  .  Hip pain, right 09/30/2019  . COVID-19 09/30/2019  . Educated about COVID-19 virus infection 12/05/2018  . High risk heterosexual behavior 11/20/2017  . Baker's cyst of knee, right 02/16/2017  . Insomnia 07/23/2016  . Hypokalemia 07/23/2016  . Controlled type 2 diabetes mellitus without complication, without long-term current use of insulin ([place]) 01/24/2016  . Skin lesion of right leg 01/24/2016  . Preventative health care 01/18/2016  . Left-sided low back pain with left-sided sciatica 10/07/2015  . Obesity (BMI 30-39.9) 06/05/2015  . OSA (obstructive sleep apnea) 02/02/2015  . Palpitations 09/19/2014  . Plantar fasciitis of right foot 11/19/2013  . Tenosynovitis of foot and ankle 11/19/2013  . Pronation deformity of ankle, acquired 11/19/2013  . DJD (degenerative joint disease) 08/26/2013  . Fibrocystic breast 08/26/2013  . HTN (hypertension) 08/26/2013  . GERD (gastroesophageal reflux disease) 08/26/2013  . S/P hysterectomy with oophorectomy 08/26/2013  . Hypothyroidism 08/26/2013  . Chronic hoarseness 08/26/2013  . Colon polyp 08/26/2013  . Hiatal hernia 08/26/2013  . Irritable bowel syndrome 08/26/2013  . Asthma 08/26/2013  . Allergic rhinitis 08/26/2013  . Hyperlipidemia 08/22/2013  . Migraine 08/22/2013  . Thyroid cancer ([place]) 08/22/2013    Past Surgical History:  Procedure Laterality Date  . ABDOMINAL HYSTERECTOMY     took right ovary and uterus  . APPENDECTOMY    . CARPAL TUNNEL RELEASE Right   . CESAREAN SECTION    . CHOLECYSTECTOMY    . EYE SURGERY Bilateral 2017  cataracts  . KNEE ARTHROPLASTY       OB History    Gravida  2   Para      Term      Preterm      AB      Living  2     SAB      TAB      Ectopic      Multiple      Live Births              Family History  Problem Relation Age of Onset  . Arthritis Mother   . Transient ischemic attack Mother   . Hypertension Mother   . Hyperlipidemia Mother   . Diabetes Father   .  Heart disease Father   . Arthritis Father   . Kidney disease Father   . Hypertension Father   . Hyperlipidemia Father   . Cancer Sister 54       pancreatic  . Fibromyalgia Sister   . Asthma Maternal Grandfather        smoker  . Depression Maternal Grandfather   . Dementia Paternal Grandmother   . Diabetes Paternal Grandfather   . Hypertension Brother   . Scoliosis Daughter        had rods placed and removed  . Arthritis Sister   . Gout Sister   . GER disease Sister   . Cancer Sister        breast cancer  . Breast cancer Sister 81    Social History   Tobacco Use  . Smoking status: Never Smoker  . Smokeless tobacco: Never Used  Vaping Use  . Vaping Use: Never used  Substance Use Topics  . Alcohol use: No    Alcohol/week: 0.0 standard drinks  . Drug use: No    Home Medications Prior to Admission medications   Medication Sig Start Date End Date Taking? Authorizing Wallace  acetaminophen (TYLENOL) 325 MG tablet Take 650 mg by mouth every 6 (six) hours as needed.   Yes Wallace, Historical, MD  albuterol (VENTOLIN HFA) 108 (90 Base) MCG/ACT inhaler INHALE 2 PUFFS INTO THE LUNGS EVERY 6 HOURS AS NEEDED FOR WHEEZING OR SHORTNESS OF BREATH 10/01/19  Yes [name], MD  Cholecalciferol ([employer] VIT D 5000 HIGH-POTENCY PO) Take 5,000 Units by mouth.   Yes Wallace, Historical, MD  diclofenac sodium (VOLTAREN) 1 % GEL Apply 2 g topically 4 (four) times daily. 04/16/18  Yes Hudnall, Sharyn Lull, MD  doxycycline (VIBRA-TABS) 100 MG tablet Take 1 tablet (100 mg total) by mouth 2 (two) times daily. Can give caps or generic. 01/01/20  Yes Saguier, Percell Miller, PA-C  fluticasone (FLONASE) 50 MCG/ACT nasal spray Place 2 sprays into both nostrils daily. 12/05/19  Yes [name], MD  loratadine (CLARITIN) 10 MG tablet Take 10 mg by mouth daily.   Yes Wallace, Historical, MD  montelukast (SINGULAIR) 10 MG tablet Take 1 tablet (10 mg total) by mouth daily as needed. 11/04/19  Yes [name],  MD  SYNTHROID 150 MCG tablet TAKE 1 TABLET(150 MCG) BY MOUTH DAILY BEFORE BREAKFAST 12/18/19  Yes [name], MD  tiZANidine (ZANAFLEX) 2 MG tablet Take 0.5-2 tablets (1-4 mg total) by mouth 2 (two) times daily as needed for muscle spasms. 09/30/19  Yes [name], MD  triamterene-hydrochlorothiazide (MAXZIDE) 75-50 MG tablet TAKE 1 TABLET BY MOUTH DAILY 12/18/19  Yes [name], MD  methylPREDNISolone (MEDROL) 4 MG tablet 5 tab po qd X  1d then 4 tab po qd X 1d then 3 tab po qd X 1d then 2 tab po qd then 1 tab po qd Patient not taking: Reported on 01/01/2020 09/30/19   [name], MD    Allergies    Levaquin [levofloxacin], Clindamycin/lincomycin, Gabapentin, Penicillins, and Codeine  Review of Systems   Review of Systems Ten systems are reviewed and are negative for acute change except as noted in the HPI  Physical Exam Updated Vital Signs BP 139/67   Pulse 98   Temp 98.2 F (36.8 C) (Oral)   Resp 20   Ht 5\' 1"  (1.549 m)   Wt 93.8 kg   SpO2 98%   BMI 39.07 kg/m   Physical Exam Constitutional:      General: She is not in acute distress.    Appearance: Normal appearance. She is well-developed. She is not ill-appearing or diaphoretic.  HENT:     Head: Normocephalic and atraumatic.  Eyes:     General: Vision grossly intact. Gaze aligned appropriately.     Pupils: Pupils are equal, round, and reactive to light.  Neck:     Trachea: Trachea and phonation normal. No tracheal tenderness or tracheal deviation.     Meningeal: Brudzinski's sign absent.  Cardiovascular:     Rate and Rhythm: Normal rate and regular rhythm.     Pulses:          Dorsalis pedis pulses are 2+ on the right side and 2+ on the left side.  Pulmonary:     Effort: Pulmonary effort is normal. Tachypnea present. No respiratory distress.     Breath sounds: Normal air entry. Decreased breath sounds and wheezing present.  Chest:     Chest wall: No tenderness.  Abdominal:     General: There is no  distension.     Palpations: Abdomen is soft.     Tenderness: There is no abdominal tenderness. There is no guarding or rebound.  Musculoskeletal:        General: Normal range of motion.     Cervical back: Normal range of motion and neck supple.     Right lower leg: No edema.     Left lower leg: No edema.  Skin:    General: Skin is warm and dry.  Neurological:     Mental Status: She is alert.     GCS: GCS eye subscore is 4. GCS verbal subscore is 5. GCS motor subscore is 6.     Comments: Speech is clear and goal oriented, follows commands Major Cranial nerves without deficit, no facial droop Moves extremities without ataxia, coordination intact  Psychiatric:        Behavior: Behavior normal.     ED Results / Procedures / Treatments   Labs (all labs ordered are listed, but only abnormal results are displayed) Labs Reviewed  CBC WITH DIFFERENTIAL/PLATELET - Abnormal; Notable for the following components:      Result Value   WBC 11.8 (*)    Lymphs Abs 4.5 (*)    Monocytes Absolute 1.1 (*)    All other components within normal limits  BASIC METABOLIC PANEL - Abnormal; Notable for the following components:   Potassium 3.0 (*)    Chloride 94 (*)    Glucose, Bld 113 (*)    All other components within normal limits  SARS CORONAVIRUS 2 BY RT PCR (HOSPITAL ORDER, [place] [facility])    EKG None  Radiology DG Chest Portable 1 View  Result  Date: 01/13/2020 CLINICAL DATA:  Cough and wheezing EXAM: PORTABLE CHEST 1 VIEW COMPARISON:  09/04/2019 FINDINGS: The heart size and mediastinal contours are within normal limits. Both lungs are clear. The visualized skeletal structures are unremarkable. IMPRESSION: No active disease. Electronically Signed   By: Franchot Gallo M.D.   On: 01/13/2020 12:24    Procedures Procedures (including critical care time)  Medications Ordered in ED Medications  magnesium sulfate IVPB 2 g 50 mL (2 g Intravenous New Bag/Given 01/13/20  1805)  0.9 %  sodium chloride infusion (250 mLs Intravenous New Bag/Given 01/13/20 1803)  potassium chloride 10 mEq in 100 mL IVPB (has no administration in time range)  albuterol (VENTOLIN HFA) 108 (90 Base) MCG/ACT inhaler 4 puff (4 puffs Inhalation Given 01/13/20 1222)  aerochamber plus with mask device 1 each (1 each Other Given 01/13/20 1215)  ipratropium-albuterol (DUONEB) 0.5-2.5 (3) MG/3ML nebulizer solution 3 mL (3 mLs Nebulization Given 01/13/20 1357)  albuterol (PROVENTIL) (2.5 MG/3ML) 0.083% nebulizer solution 2.5 mg (2.5 mg Nebulization Given 01/13/20 1451)  methylPREDNISolone sodium succinate (SOLU-MEDROL) 125 mg/2 mL injection 80 mg (80 mg Intravenous Given 01/13/20 1500)  albuterol (PROVENTIL) (2.5 MG/3ML) 0.083% nebulizer solution 2.5 mg (2.5 mg Nebulization Given 01/13/20 1600)    ED Course  I have reviewed the triage vital signs and the nursing notes.  Pertinent labs & imaging results that were available during my care of the patient were reviewed by me and considered in my medical decision making (see chart for details).    MDM Rules/Calculators/A&P                          Additional History Obtained: 1. Nursing notes from this visit. 2. No pertinent ED visits within the last year. ------- On exam 69 year old female well-appearing no acute distress reports wheezing and URI with cough for the past few days.  Vital signs within normal limits on exam.  She denies any associated chest pain or shortness of breath.  Cranial nerves intact, airway clear, no meningismus.  Heart regular rate and rhythm.  Lungs with diminished movement and wheezing bilaterally, mildly tachypneic.  Abdomen soft nontender without peritoneal signs.  Neurovascular tact to all 4 extremities without evidence of DVT.  Patient has been using albuterol at home without relief.  Will give additional albuterol in the ER and reassess, will obtain chest x-ray and Covid test. - CXR:  IMPRESSION:  No active disease.    I personally reviewed patient's chest x-ray and agree with radiologist interpretation.  Covid test negative. - Patient received albuterol inhaler with spacer and DuoNeb.  She was reassessed resting comfortably in bed no acute distress reports minimal improvement of symptoms.  At this point patient reported that she is not a diabetic and has had no adverse reaction with steroids in the past.  Will give additional nebulizer as well as Solu-Medrol and reassess.  Lungs with improved air movement but still diffuse wheezing. - Patient was reassessed multiple times during her last visit, she received a total of 3 nebulizers, additionally received steroids and magnesium.  She still has some tachypnea and reports continued wheezing.  She does not feel comfortable going home.  Given age and symptoms feel patient would benefit from observation admission at this point EKG, CBC and BMP were ordered. - I reviewed the chart the following labs: BMP shows no emergent electrolyte derangement or evidence of acute kidney injury or gap.  She has hypokalemia of 3.0 will replete  in the ER. CBC shows mild leukocytosis of 11, no evidence of anemia.  Given lack of fever or evidence of pneumonia on chest x-ray suspicion for community-acquired pneumonia at this time.  EKG reviewed with Dr. Sherry Ruffing shows no acute ischemic changes.  Consult placed to hospitalist. - 6:43 PM: Discussed case with Dr. Ara Kussmaul, patient has been accepted to hospitalist [facility] at [facility].   Patient was seen and evaluated by Dr. Sherry Ruffing during this visit.  Note: Portions of this report may have been transcribed using voice recognition software. Every effort was made to ensure accuracy; however, inadvertent computerized transcription errors may still be present. Final Clinical Impression(s) / ED Diagnoses Final diagnoses:  Viral URI with cough  Wheezing    Rx / DC Orders ED Discharge Orders    None       Gari Crown 01/13/20 1844    Tegeler, Gwenyth Allegra, MD 01/14/20 [id number]

## 2020-01-14 ENCOUNTER — Encounter (HOSPITAL_COMMUNITY): Payer: Self-pay | Admitting: Family Medicine

## 2020-01-14 DIAGNOSIS — J209 Acute bronchitis, unspecified: Secondary | ICD-10-CM | POA: Diagnosis not present

## 2020-01-14 DIAGNOSIS — I10 Essential (primary) hypertension: Secondary | ICD-10-CM | POA: Diagnosis not present

## 2020-01-14 LAB — BASIC METABOLIC PANEL
Anion gap: 16 — ABNORMAL HIGH (ref 5–15)
BUN: 13 mg/dL (ref 8–23)
CO2: 24 mmol/L (ref 22–32)
Calcium: 9.5 mg/dL (ref 8.9–10.3)
Chloride: 98 mmol/L (ref 98–111)
Creatinine, Ser: 0.82 mg/dL (ref 0.44–1.00)
GFR calc Af Amer: >60 mL/min (ref >60)
GFR calc non Af Amer: >60 mL/min (ref >60)
Glucose, Bld: 142 mg/dL — ABNORMAL HIGH (ref 70–99)
Potassium: 3.6 mmol/L (ref 3.5–5.1)
Sodium: 138 mmol/L (ref 135–145)

## 2020-01-14 LAB — CBC WITH DIFFERENTIAL/PLATELET
Abs Immature Granulocytes: 0.07 10*3/uL (ref 0.00–0.07)
Basophils Absolute: 0 10*3/uL (ref 0.0–0.1)
Basophils Relative: 0 %
Eosinophils Absolute: 0 10*3/uL (ref 0.0–0.5)
Eosinophils Relative: 0 %
HCT: 41.7 % (ref 36.0–46.0)
Hemoglobin: 13.8 g/dL (ref 12.0–15.0)
Immature Granulocytes: 1 %
Lymphocytes Relative: 20 %
Lymphs Abs: 2.3 10*3/uL (ref 0.7–4.0)
MCH: 30 pg (ref 26.0–34.0)
MCHC: 33.1 g/dL (ref 30.0–36.0)
MCV: 90.7 fL (ref 80.0–100.0)
Monocytes Absolute: 0.7 10*3/uL (ref 0.1–1.0)
Monocytes Relative: 6 %
Neutro Abs: 8.6 10*3/uL — ABNORMAL HIGH (ref 1.7–7.7)
Neutrophils Relative %: 73 %
Platelets: 331 10*3/uL (ref 150–400)
RBC: 4.6 MIL/uL (ref 3.87–5.11)
RDW: 13.7 % (ref 11.5–15.5)
WBC: 11.6 10*3/uL — ABNORMAL HIGH (ref 4.0–10.5)
nRBC: 0 % (ref 0.0–0.2)

## 2020-01-14 LAB — MAGNESIUM: Magnesium: 2 mg/dL (ref 1.7–2.4)

## 2020-01-14 LAB — HIV ANTIBODY (ROUTINE TESTING W REFLEX): HIV Screen 4th Generation wRfx: NONREACTIVE

## 2020-01-14 MED ORDER — GUAIFENESIN 100 MG/5ML PO SOLN: 5.0000 mL | ORAL | Status: DC | PRN

## 2020-01-14 MED ORDER — IPRATROPIUM-ALBUTEROL 0.5-2.5 (3) MG/3ML IN SOLN
3.0000 mL | Freq: Two times a day (BID) | RESPIRATORY_TRACT | Status: DC
  Administered 2020-01-14 – 2020-01-16 (×4): 3 mL via RESPIRATORY_TRACT
  Filled 2020-01-14 (×4): qty 3

## 2020-01-14 MED ORDER — ACETAMINOPHEN 325 MG PO TABS
650.0000 mg | ORAL_TABLET | Freq: Four times a day (QID) | ORAL | Status: DC | PRN
  Administered 2020-01-14 – 2020-01-15 (×2): 650 mg via ORAL
  Filled 2020-01-14 (×2): qty 2

## 2020-01-14 MED ORDER — GUAIFENESIN-DM 100-10 MG/5ML PO SYRP
5.0000 mL | ORAL_SOLUTION | ORAL | Status: DC | PRN
  Administered 2020-01-15 – 2020-01-16 (×4): 5 mL via ORAL
  Filled 2020-01-14 (×4): qty 10

## 2020-01-14 MED ORDER — ALBUTEROL SULFATE (2.5 MG/3ML) 0.083% IN NEBU: 2.5000 mg | INHALATION_SOLUTION | RESPIRATORY_TRACT | Status: DC | PRN

## 2020-01-14 MED ORDER — LEVOTHYROXINE SODIUM 50 MCG PO TABS
150.0000 ug | ORAL_TABLET | Freq: Every day | ORAL | Status: DC
  Administered 2020-01-14 – 2020-01-16 (×3): 150 ug via ORAL
  Filled 2020-01-14 (×3): qty 1

## 2020-01-14 MED ORDER — ALBUTEROL SULFATE (2.5 MG/3ML) 0.083% IN NEBU
2.5000 mg | INHALATION_SOLUTION | Freq: Four times a day (QID) | RESPIRATORY_TRACT | Status: DC
  Administered 2020-01-14: 2.5 mg via RESPIRATORY_TRACT
  Filled 2020-01-14: qty 3

## 2020-01-14 MED ORDER — MONTELUKAST SODIUM 10 MG PO TABS
10.0000 mg | ORAL_TABLET | Freq: Every day | ORAL | Status: DC
  Administered 2020-01-14 – 2020-01-15 (×2): 10 mg via ORAL
  Filled 2020-01-14 (×2): qty 1

## 2020-01-14 MED ORDER — ALBUTEROL SULFATE (2.5 MG/3ML) 0.083% IN NEBU: 2.5000 mg | INHALATION_SOLUTION | Freq: Two times a day (BID) | RESPIRATORY_TRACT | Status: DC

## 2020-01-14 MED ORDER — ENOXAPARIN SODIUM 40 MG/0.4ML ~~LOC~~ SOLN
40.0000 mg | SUBCUTANEOUS | Status: DC
  Administered 2020-01-14 – 2020-01-15 (×2): 40 mg via SUBCUTANEOUS
  Filled 2020-01-14 (×3): qty 0.4

## 2020-01-14 MED ORDER — ONDANSETRON HCL 4 MG/2ML IJ SOLN: 4.0000 mg | Freq: Four times a day (QID) | INTRAMUSCULAR | Status: DC | PRN

## 2020-01-14 MED ORDER — HYDRALAZINE HCL 20 MG/ML IJ SOLN: 10.0000 mg | INTRAMUSCULAR | Status: DC | PRN

## 2020-01-14 MED ORDER — BUDESONIDE 0.25 MG/2ML IN SUSP: 0.2500 mg | Freq: Two times a day (BID) | RESPIRATORY_TRACT | Status: DC

## 2020-01-14 MED ORDER — BUDESONIDE 0.25 MG/2ML IN SUSP
0.2500 mg | Freq: Two times a day (BID) | RESPIRATORY_TRACT | Status: DC
  Administered 2020-01-14 – 2020-01-16 (×5): 0.25 mg via RESPIRATORY_TRACT
  Filled 2020-01-14 (×5): qty 2

## 2020-01-14 MED ORDER — ONDANSETRON HCL 4 MG PO TABS: 4.0000 mg | ORAL_TABLET | Freq: Four times a day (QID) | ORAL | Status: DC | PRN

## 2020-01-14 MED ORDER — METHYLPREDNISOLONE SODIUM SUCC 125 MG IJ SOLR
40.0000 mg | Freq: Two times a day (BID) | INTRAMUSCULAR | Status: DC
  Administered 2020-01-14 – 2020-01-15 (×3): 40 mg via INTRAVENOUS
  Filled 2020-01-14 (×3): qty 2

## 2020-01-14 MED ORDER — ALBUTEROL SULFATE (2.5 MG/3ML) 0.083% IN NEBU: 2.5000 mg | INHALATION_SOLUTION | RESPIRATORY_TRACT | Status: DC

## 2020-01-14 NOTE — Progress Notes (Signed)
   Follow Up Note  HPI: Please refer to full H&P done earlier on today Briefly 69 year old female with history of hypertension, hypothyroidism, prediabetes, presents with increased wheezing and cough for the last 24 hours.  Patient currently admitted for possible acute bronchitis likely post viral.   Today, patient still noted to be wheezing bilaterally, still reports some cough, denies any worsening shortness of breath, chest pain, abdominal pain, nausea/vomiting, fever/chills.  Noted voice is hoarse this morning.  Exam:  General: NAD   Cardiovascular: S1, S2 present  Respiratory:  Diminished breath sounds bilaterally, with expiratory wheezing noted bilaterally  Abdomen: Soft, nontender, nondistended, bowel sounds present  Musculoskeletal: No bilateral pedal edema noted  Skin: Normal  Psychiatry: Normal mood   Present on Admission: . Acute bronchitis . HTN (hypertension) . Allergic rhinitis   A/P Possible acute bronchitis likely postviral Continue IV Solu-Medrol, Pulmicort, albuterol nebulizer Currently afebrile, with leukocytosis likely due to steroids Possible discharge on 01/15/2020 if improvement

## 2020-01-14 NOTE — Progress Notes (Signed)
Patient here from [facility], alert and oriented, denies any pain/distress, oriented the patient to room/unit, patient resting in bed. [facility] admitting team notified, waiting on orders.

## 2020-01-14 NOTE — ED Notes (Signed)
[facility] called for transport to [place] 

## 2020-01-14 NOTE — H&P (Signed)
History and Physical    Monica Wallace [id number]:[id number] DOB: 06/11/1951 DOA: 01/13/2020  PCP: [name], MD  Patient coming from: Home.  Chief Complaint: Wheezing.  HPI: Monica Wallace is a 69 y.o. female with history of hypertension postoperative hypothyroidism prediabetes presents to the ER at [facility] with complaints of increasing wheezing cough for the last 24 hours.  Patient states she had an upper respiratory tract symptoms 5 days ago when she was exposed to a kid who were sick.  Following which patient has had increasing upper respiratory tract symptoms and also was recently placed on doxycycline for parotid/mastoid infection which patient finished the course.  Because of wheezing increased patient came to the ER.  Denies chest pain fever or chills.  ED Course: In the ER patient is found to be wheezing bilaterally chest x-ray unremarkable Covid test negative.  Given the ongoing wheezing despite nebulizer treatment and steroids patient has been admitted for further observation.  On exam patient is wheezing but able to talk sentences.  EKG shows sinus tachycardia.  Labs are remarkable for mild hypokalemia for which patient was given potassium replacement blood pressure is elevated with systolic more than 810.  Mild leukocytosis of 11.8.  Review of Systems: As per HPI, rest all negative.   Past Medical History:  Diagnosis Date  . Arthritis   . Baker's cyst of knee, right 02/16/2017  . Cancer ([place])    thyroid (2002)  . Cystic breast   . GERD (gastroesophageal reflux disease)   . Hepatic cyst 02/02/2015  . Hyperlipidemia   . Hypertension   . Insomnia 07/23/2016  . Menopause   . Migraine   . Palpitations   . Preventative health care 01/18/2016  . Sleep apnea 02/02/2015  . Thyroid disease     Past Surgical History:  Procedure Laterality Date  . ABDOMINAL HYSTERECTOMY     took right ovary and uterus  . APPENDECTOMY    . CARPAL TUNNEL RELEASE Right   . CESAREAN SECTION     . CHOLECYSTECTOMY    . EYE SURGERY Bilateral 2017   cataracts  . KNEE ARTHROPLASTY       reports that she has never smoked. She has never used smokeless tobacco. She reports that she does not drink alcohol and does not use drugs.  Allergies  Allergen Reactions  . Levaquin [Levofloxacin] Shortness Of Breath  . Clindamycin/Lincomycin     Pruritus No rash  . Gabapentin Itching  . Penicillins     Positive on allergy testing 'years ago.' Has never had penicillin. Has tolerated amoxicillin.  . Codeine Itching    Family History  Problem Relation Age of Onset  . Arthritis Mother   . Transient ischemic attack Mother   . Hypertension Mother   . Hyperlipidemia Mother   . Diabetes Father   . Heart disease Father   . Arthritis Father   . Kidney disease Father   . Hypertension Father   . Hyperlipidemia Father   . Cancer Sister 29       pancreatic  . Fibromyalgia Sister   . Asthma Maternal Grandfather        smoker  . Depression Maternal Grandfather   . Dementia Paternal Grandmother   . Diabetes Paternal Grandfather   . Hypertension Brother   . Scoliosis Daughter        had rods placed and removed  . Arthritis Sister   . Gout Sister   . GER disease Sister   .  Cancer Sister        breast cancer  . Breast cancer Sister 51    Prior to Admission medications   Medication Sig Start Date End Date Taking? Authorizing Provider  acetaminophen (TYLENOL) 325 MG tablet Take 650 mg by mouth every 6 (six) hours as needed.   Yes [provider]  albuterol (VENTOLIN HFA) 108 (90 Base) MCG/ACT inhaler INHALE 2 PUFFS INTO THE LUNGS EVERY 6 HOURS AS NEEDED FOR WHEEZING OR SHORTNESS OF BREATH 10/01/19  Yes [name], MD  Cholecalciferol ([employer] VIT D 5000 HIGH-POTENCY PO) Take 5,000 Units by mouth.   Yes [provider]  diclofenac sodium (VOLTAREN) 1 % GEL Apply 2 g topically 4 (four) times daily. 04/16/18  Yes Hudnall, Sharyn Lull, MD  doxycycline (VIBRA-TABS) 100 MG tablet  Take 1 tablet (100 mg total) by mouth 2 (two) times daily. Can give caps or generic. 01/01/20  Yes Saguier, Percell Miller, PA-C  fluticasone (FLONASE) 50 MCG/ACT nasal spray Place 2 sprays into both nostrils daily. 12/05/19  Yes [name], MD  loratadine (CLARITIN) 10 MG tablet Take 10 mg by mouth daily.   Yes [provider]  montelukast (SINGULAIR) 10 MG tablet Take 1 tablet (10 mg total) by mouth daily as needed. 11/04/19  Yes [name], MD  SYNTHROID 150 MCG tablet TAKE 1 TABLET(150 MCG) BY MOUTH DAILY BEFORE BREAKFAST 12/18/19  Yes [name], MD  tiZANidine (ZANAFLEX) 2 MG tablet Take 0.5-2 tablets (1-4 mg total) by mouth 2 (two) times daily as needed for muscle spasms. 09/30/19  Yes [name], MD  triamterene-hydrochlorothiazide (MAXZIDE) 75-50 MG tablet TAKE 1 TABLET BY MOUTH DAILY 12/18/19  Yes [name], MD  methylPREDNISolone (MEDROL) 4 MG tablet 5 tab po qd X 1d then 4 tab po qd X 1d then 3 tab po qd X 1d then 2 tab po qd then 1 tab po qd Patient not taking: Reported on 01/01/2020 09/30/19   [name], MD    Physical Exam: Constitutional: Moderately built and nourished. Vitals:   01/14/20 0100 01/14/20 0221 01/14/20 0302 01/14/20 0309  BP: (!) 118/52 (!) 144/83 (!) 160/80   Pulse: (!) 110 (!) 114 98   Resp: (!) 23 20 14    Temp:  98.2 F (36.8 C) 98.2 F (36.8 C)   TempSrc:  Oral Oral   SpO2: 97% 98% 98%   Weight:    91.4 kg  Height:    5\' 1"  (1.549 m)   Eyes: Anicteric no pallor. ENMT: No discharge from the ears eyes nose or mouth. Neck: No mass felt.  No neck rigidity.  No JVD appreciated. Respiratory: Bilateral expiratory wheeze heard.  No crepitations. Cardiovascular: S1-S2 heard. Abdomen: Soft nontender bowel sounds present. Musculoskeletal: No edema. Skin: No rash. Neurologic: Alert awake oriented to time place and person.  Moves all extremities. Psychiatric: Appears normal.  Normal affect.   Labs on Admission: I have personally  reviewed following labs and imaging studies  CBC: Recent Labs  Lab 01/13/20 1503  WBC 11.8*  NEUTROABS 5.8  HGB 13.1  HCT 40.6  MCV 92.3  PLT 517   Basic Metabolic Panel: Recent Labs  Lab 01/13/20 1503  NA 136  K 3.0*  CL 94*  CO2 28  GLUCOSE 113*  BUN 14  CREATININE 0.79  CALCIUM 9.5   GFR: Estimated Creatinine Clearance: 67.4 mL/min (by C-G formula based on SCr of 0.79 mg/dL). Liver Function Tests: No results for input(s): AST,  ALT, ALKPHOS, BILITOT, PROT, ALBUMIN in the last 168 hours. No results for input(s): LIPASE, AMYLASE in the last 168 hours. No results for input(s): AMMONIA in the last 168 hours. Coagulation Profile: No results for input(s): INR, PROTIME in the last 168 hours. Cardiac Enzymes: No results for input(s): CKTOTAL, CKMB, CKMBINDEX, TROPONINI in the last 168 hours. BNP (last 3 results) No results for input(s): PROBNP in the last 8760 hours. HbA1C: No results for input(s): HGBA1C in the last 72 hours. CBG: No results for input(s): GLUCAP in the last 168 hours. Lipid Profile: No results for input(s): CHOL, HDL, LDLCALC, TRIG, CHOLHDL, LDLDIRECT in the last 72 hours. Thyroid Function Tests: No results for input(s): TSH, T4TOTAL, FREET4, T3FREE, THYROIDAB in the last 72 hours. Anemia Panel: No results for input(s): VITAMINB12, FOLATE, FERRITIN, TIBC, IRON, RETICCTPCT in the last 72 hours. Urine analysis:    Component Value Date/Time   BILIRUBINUR Neg 07/01/2014 0845   PROTEINUR Neg 07/01/2014 0845   UROBILINOGEN 0.2 07/01/2014 0845   NITRITE Neg 07/01/2014 0845   LEUKOCYTESUR Negative 07/01/2014 0845   Sepsis Labs: @LABRCNTIP (procalcitonin:4,lacticidven:4) ) Recent Results (from the past 240 hour(s))  SARS Coronavirus 2 by RT PCR (hospital order, performed in [facility] hospital lab) Nasopharyngeal Nasopharyngeal Swab     Status: None   Collection Time: 01/13/20 12:32 PM   Specimen: Nasopharyngeal Swab  Result Value Ref Range Status    SARS Coronavirus 2 NEGATIVE NEGATIVE Final    Comment: (NOTE) SARS-CoV-2 target nucleic acids are NOT DETECTED.  The SARS-CoV-2 RNA is generally detectable in upper and lower respiratory specimens during the acute phase of infection. The lowest concentration of SARS-CoV-2 viral copies this assay can detect is 250 copies / mL. A negative result does not preclude SARS-CoV-2 infection and should not be used as the sole basis for treatment or other patient management decisions.  A negative result may occur with improper specimen collection / handling, submission of specimen other than nasopharyngeal swab, presence of viral mutation(s) within the areas targeted by this assay, and inadequate number of viral copies (<250 copies / mL). A negative result must be combined with clinical observations, patient history, and epidemiological information.  Fact Sheet for Patients:   StrictlyIdeas.no  Fact Sheet for Healthcare Providers: BankingDealers.co.za  This test is not yet approved or  cleared by the [place] FDA and has been authorized for detection and/or diagnosis of SARS-CoV-2 by FDA under an Emergency Use Authorization (EUA).  This EUA will remain in effect (meaning this test can be used) for the duration of the COVID-19 declaration under Section 564(b)(1) of the Act, 21 U.S.C. section 360bbb-3(b)(1), unless the authorization is terminated or revoked sooner.  Performed at [facility], [address] [place], [place] [address]      Radiological Exams on Admission: DG Chest Portable 1 View  Result Date: 01/13/2020 CLINICAL DATA:  Cough and wheezing EXAM: PORTABLE CHEST 1 VIEW COMPARISON:  09/04/2019 FINDINGS: The heart size and mediastinal contours are within normal limits. Both lungs are clear. The visualized skeletal structures are unremarkable. IMPRESSION: No active disease. Electronically Signed   By: Franchot Gallo  M.D.   On: 01/13/2020 12:24    EKG: Independently reviewed.  Sinus tachycardia.  Assessment/Plan Principal Problem:   Acute bronchitis Active Problems:   HTN (hypertension)   Allergic rhinitis    1. Acute bronchitis likely postviral.  Patient has been placed on IV Solu-Medrol Pulmicort and albuterol nebulizer.  Closely monitor. 2. Hypertension uncontrolled we  will keep patient on as needed IV hydralazine.  Restart patient's Maxide only when potassium is corrected. 3. Hypokalemia could be from diuretic use.  Replace potassium which has been already done we will recheck potassium magnesium. 4. History of postoperative hypothyroidism on Synthroid.   DVT prophylaxis: Lovenox. Code Status: Full code. Family Communication: Discussed with patient. Disposition Plan: Home. Consults called: None. Admission status: Observation.   Rise Patience MD [facility] Pager [phone].  If 7PM-7AM, please contact night-coverage [email or web address] Password [email or web address]  01/14/2020, 5:25 AM

## 2020-01-15 DIAGNOSIS — Z881 Allergy status to other antibiotic agents status: Secondary | ICD-10-CM | POA: Diagnosis not present

## 2020-01-15 DIAGNOSIS — Z79899 Other long term (current) drug therapy: Secondary | ICD-10-CM | POA: Diagnosis not present

## 2020-01-15 DIAGNOSIS — Z8616 Personal history of COVID-19: Secondary | ICD-10-CM | POA: Diagnosis not present

## 2020-01-15 DIAGNOSIS — Z833 Family history of diabetes mellitus: Secondary | ICD-10-CM | POA: Diagnosis not present

## 2020-01-15 DIAGNOSIS — J309 Allergic rhinitis, unspecified: Secondary | ICD-10-CM | POA: Diagnosis present

## 2020-01-15 DIAGNOSIS — E785 Hyperlipidemia, unspecified: Secondary | ICD-10-CM | POA: Diagnosis present

## 2020-01-15 DIAGNOSIS — E890 Postprocedural hypothyroidism: Secondary | ICD-10-CM | POA: Diagnosis present

## 2020-01-15 DIAGNOSIS — Z90722 Acquired absence of ovaries, bilateral: Secondary | ICD-10-CM | POA: Diagnosis not present

## 2020-01-15 DIAGNOSIS — K219 Gastro-esophageal reflux disease without esophagitis: Secondary | ICD-10-CM | POA: Diagnosis present

## 2020-01-15 DIAGNOSIS — J209 Acute bronchitis, unspecified: Secondary | ICD-10-CM | POA: Diagnosis present

## 2020-01-15 DIAGNOSIS — Z90710 Acquired absence of both cervix and uterus: Secondary | ICD-10-CM | POA: Diagnosis not present

## 2020-01-15 DIAGNOSIS — Z79890 Hormone replacement therapy (postmenopausal): Secondary | ICD-10-CM | POA: Diagnosis not present

## 2020-01-15 DIAGNOSIS — Z9049 Acquired absence of other specified parts of digestive tract: Secondary | ICD-10-CM | POA: Diagnosis not present

## 2020-01-15 DIAGNOSIS — G4700 Insomnia, unspecified: Secondary | ICD-10-CM | POA: Diagnosis present

## 2020-01-15 DIAGNOSIS — R7303 Prediabetes: Secondary | ICD-10-CM | POA: Diagnosis present

## 2020-01-15 DIAGNOSIS — Z85850 Personal history of malignant neoplasm of thyroid: Secondary | ICD-10-CM | POA: Diagnosis not present

## 2020-01-15 DIAGNOSIS — J329 Chronic sinusitis, unspecified: Secondary | ICD-10-CM | POA: Diagnosis present

## 2020-01-15 DIAGNOSIS — I341 Nonrheumatic mitral (valve) prolapse: Secondary | ICD-10-CM | POA: Diagnosis present

## 2020-01-15 DIAGNOSIS — Z8249 Family history of ischemic heart disease and other diseases of the circulatory system: Secondary | ICD-10-CM | POA: Diagnosis not present

## 2020-01-15 DIAGNOSIS — I10 Essential (primary) hypertension: Secondary | ICD-10-CM | POA: Diagnosis present

## 2020-01-15 DIAGNOSIS — E876 Hypokalemia: Secondary | ICD-10-CM | POA: Diagnosis present

## 2020-01-15 DIAGNOSIS — Z20822 Contact with and (suspected) exposure to covid-19: Secondary | ICD-10-CM | POA: Diagnosis present

## 2020-01-15 DIAGNOSIS — G4733 Obstructive sleep apnea (adult) (pediatric): Secondary | ICD-10-CM | POA: Diagnosis present

## 2020-01-15 LAB — BASIC METABOLIC PANEL
Anion gap: 11 (ref 5–15)
BUN: 20 mg/dL (ref 8–23)
CO2: 27 mmol/L (ref 22–32)
Calcium: 9.6 mg/dL (ref 8.9–10.3)
Chloride: 102 mmol/L (ref 98–111)
Creatinine, Ser: 0.79 mg/dL (ref 0.44–1.00)
GFR calc Af Amer: >60 mL/min (ref >60)
GFR calc non Af Amer: >60 mL/min (ref >60)
Glucose, Bld: 170 mg/dL — ABNORMAL HIGH (ref 70–99)
Potassium: 4.1 mmol/L (ref 3.5–5.1)
Sodium: 140 mmol/L (ref 135–145)

## 2020-01-15 MED ORDER — ALBUTEROL SULFATE (2.5 MG/3ML) 0.083% IN NEBU: 3.0000 mL | INHALATION_SOLUTION | Freq: Four times a day (QID) | RESPIRATORY_TRACT | Status: DC | PRN

## 2020-01-15 MED ORDER — FLUTICASONE PROPIONATE 50 MCG/ACT NA SUSP
2.0000 | Freq: Every day | NASAL | Status: DC
  Administered 2020-01-15 – 2020-01-16 (×2): 2 via NASAL
  Filled 2020-01-15: qty 16

## 2020-01-15 MED ORDER — PREDNISONE 20 MG PO TABS
40.0000 mg | ORAL_TABLET | Freq: Every day | ORAL | Status: DC
  Administered 2020-01-16: 40 mg via ORAL
  Filled 2020-01-15: qty 2

## 2020-01-15 MED ORDER — TRIAMTERENE-HCTZ 75-50 MG PO TABS
1.0000 | ORAL_TABLET | Freq: Every day | ORAL | Status: DC
  Administered 2020-01-15 – 2020-01-16 (×2): 1 via ORAL
  Filled 2020-01-15 (×2): qty 1

## 2020-01-15 NOTE — Progress Notes (Signed)
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 History of mitral valve prolapse, mild, acquired hypothyroidism, prediabetes, HTN, prior coronavirus infection around January 2021 Presents from home with 24 to 48 hours of upper respiratory issues Relates that she has chronic wheezing and chronic sinusitis and has had this since moving here from [place]-tells me she was referred to the [facility] there and offered endoscopy/nasolaryngoscopy but has never had spirometry She recently finished antibiotics for mastoid/parotid infection She was found to be wheezing in the emergency room and steroids were started   Assessment & Plan:   Principal Problem:   Acute bronchitis Active Problems:   HTN (hypertension)   Allergic rhinitis   1. Reactive airway disease?  Asthma a. Will need definitive outpatient spirometry and I would defer this to her PCP b. De-escalate today from Solu-Medrol to prednisone 40 mg as she is slightly improved but still has a wheeze c. She will need to be ambulated to ensure that she does not desat prior to discharge in the next day day and a half d. Her chronic sinus issues will need to be addressed may be with an allergist or pulmonology-she is currently on max therapy of Flonase which will be resumed, Singulair as well as Claritin 2. Morbid obesity BMI 38 a. Possible restrictive component to her breathing b. Outpatient follow-up 3. Mild mitral valve prolapse followed by Dr. Agustin Cree a. Outpatient screening by him b. Last echo 04/2017 was relatively benign 4. Acquired hypothyroidism a. Continue Synthroid 150 b. Will need a TSH in 3 weeks 5. Prediabetes-blood sugars ranging 1 13-1 70 6. HTN a. Not controlled on admission b. Resuming Maxide 1 tablet daily and recheck labs a.m.  DVT prophylaxis:  Code Status: Full Family Communication: Patient  alone Disposition:   Status is: Observation  The patient remains OBS appropriate and will d/c before 2 midnights.  Dispo: The patient is from: Home              Anticipated d/c is to: Home              Anticipated d/c date is: 1 day              Patient currently is not medically stable to d/c.    Consultants:   None none  Procedures: None  Antimicrobials: None   Subjective: Awake alert coherent no distress Tells me she has some sinus congestion has a mild headache-this seems to be normal for her She has no chest pain fever cough dark sputum and overall feels slightly improved from prior She has not been out of bed yet  Objective: Vitals:   01/14/20 2136 01/15/20 0429 01/15/20 0813 01/15/20 0816  BP:  133/63    Pulse:  68    Resp:  18    Temp:  97.9 F (36.6 C)    TempSrc:  Oral    SpO2: 96% 96% 97% 97%  Weight:      Height:        Intake/Output Summary (Last 24 hours) at 01/15/2020 0941 Last data filed at 01/14/2020 1843 Gross per 24 hour  Intake 720 ml  Output --  Net 720 ml   Filed Weights   01/13/20 1103 01/14/20 0309  Weight: 93.8 kg 91.4 kg    Examination:  General exam: Awake alert pleasant obese Mallampati 2 No JVD Respiratory system: Slight wheeze on left lower posterolateral  lung fields no rales no rhonchi  Cardiovascular system: S1-S2 no murmur rub or gallop Gastrointestinal system: Soft nontender obese. Central nervous system: Grossly intact Extremities: No lower extremity edema Skin: No rash Psychiatry: Euthymic and pleasant  Data Reviewed: I have personally reviewed following labs and imaging studies Sodium 140 potassium 4.1 glucose 70 BUNs/creatinine 20/0.7   Radiology Studies: DG Chest Portable 1 View  Result Date: 01/13/2020 CLINICAL DATA:  Cough and wheezing EXAM: PORTABLE CHEST 1 VIEW COMPARISON:  09/04/2019 FINDINGS: The heart size and mediastinal contours are within normal limits. Both lungs are clear. The visualized  skeletal structures are unremarkable. IMPRESSION: No active disease. Electronically Signed   By: Franchot Gallo M.D.   On: 01/13/2020 12:24     Scheduled Meds: . budesonide (PULMICORT) nebulizer solution  0.25 mg Nebulization BID  . enoxaparin (LOVENOX) injection  40 mg Subcutaneous Q24H  . fluticasone  2 spray Each Nare Daily  . ipratropium-albuterol  3 mL Nebulization BID  . levothyroxine  150 mcg Oral Q0600  . methylPREDNISolone (SOLU-MEDROL) injection  40 mg Intravenous BID  . montelukast  10 mg Oral QHS   Continuous Infusions: 25  LOS: 0 days   25 Time spent: [place], MD [facility] To contact the attending provider between 7A-7P or the covering provider during after hours 7P-7A, please log into the web site [email or web address] and access using universal  password for that web site. If you do not have the password, please call the hospital operator.  01/15/2020, 9:41 AM

## 2020-01-15 NOTE — Progress Notes (Signed)
PT Cancellation Note  Patient Details Name: Belvia Gotschall MRN: [id number] DOB: 20-Oct-1950   Cancelled Treatment:    Reason Eval/Treat Not Completed: PT screened, no needs identified, will sign off   Claretha Cooper 01/15/2020, 12:47 PM  Tresa Endo PT Acute Rehabilitation Services Pager [phone] Office [phone]

## 2020-01-15 NOTE — Progress Notes (Signed)
OT Cancellation Note  Patient Details Name: Monica Wallace MRN: [id number] DOB: 1951/06/28   Cancelled Treatment:    Reason Eval/Treat Not Completed: OT screened, no needs identified, will sign off. Patient reports has been getting up to bathroom herself and "I gave myself a bath today." Patient still works, fully I. NA reports patient has been independent in her room. Notified RN will sign off at this time. Please re-consult if any new needs arise.  Delbert Phenix OT Pager: [place] 01/15/2020, 11:51 AM

## 2020-01-16 ENCOUNTER — Encounter: Payer: Self-pay | Admitting: Family Medicine

## 2020-01-16 LAB — COMPREHENSIVE METABOLIC PANEL
ALT: 21 U/L (ref 0–44)
AST: 21 U/L (ref 15–41)
Albumin: 3.5 g/dL (ref 3.5–5.0)
Alkaline Phosphatase: 59 U/L (ref 38–126)
Anion gap: 9 (ref 5–15)
BUN: 20 mg/dL (ref 8–23)
CO2: 28 mmol/L (ref 22–32)
Calcium: 9.4 mg/dL (ref 8.9–10.3)
Chloride: 100 mmol/L (ref 98–111)
Creatinine, Ser: 0.7 mg/dL (ref 0.44–1.00)
GFR calc Af Amer: >60 mL/min (ref >60)
GFR calc non Af Amer: >60 mL/min (ref >60)
Glucose, Bld: 128 mg/dL — ABNORMAL HIGH (ref 70–99)
Potassium: 3.6 mmol/L (ref 3.5–5.1)
Sodium: 137 mmol/L (ref 135–145)
Total Bilirubin: 0.5 mg/dL (ref 0.3–1.2)
Total Protein: 7.1 g/dL (ref 6.5–8.1)

## 2020-01-16 LAB — CBC WITH DIFFERENTIAL/PLATELET
Abs Immature Granulocytes: 0.17 10*3/uL — ABNORMAL HIGH (ref 0.00–0.07)
Basophils Absolute: 0.1 10*3/uL (ref 0.0–0.1)
Basophils Relative: 0 %
Eosinophils Absolute: 0 10*3/uL (ref 0.0–0.5)
Eosinophils Relative: 0 %
HCT: 41 % (ref 36.0–46.0)
Hemoglobin: 13.5 g/dL (ref 12.0–15.0)
Immature Granulocytes: 1 %
Lymphocytes Relative: 31 %
Lymphs Abs: 5 10*3/uL — ABNORMAL HIGH (ref 0.7–4.0)
MCH: 29.9 pg (ref 26.0–34.0)
MCHC: 32.9 g/dL (ref 30.0–36.0)
MCV: 90.9 fL (ref 80.0–100.0)
Monocytes Absolute: 1 10*3/uL (ref 0.1–1.0)
Monocytes Relative: 6 %
Neutro Abs: 10 10*3/uL — ABNORMAL HIGH (ref 1.7–7.7)
Neutrophils Relative %: 62 %
Platelets: 321 10*3/uL (ref 150–400)
RBC: 4.51 MIL/uL (ref 3.87–5.11)
RDW: 13.7 % (ref 11.5–15.5)
WBC: 16.2 10*3/uL — ABNORMAL HIGH (ref 4.0–10.5)
nRBC: 0 % (ref 0.0–0.2)

## 2020-01-16 MED ORDER — PANTOPRAZOLE SODIUM 40 MG PO TBEC
40.0000 mg | DELAYED_RELEASE_TABLET | Freq: Every day | ORAL | Status: DC
  Administered 2020-01-16 (×2): 40 mg via ORAL
  Filled 2020-01-16: qty 1

## 2020-01-16 MED ORDER — IPRATROPIUM-ALBUTEROL 0.5-2.5 (3) MG/3ML IN SOLN: 3.0000 mL | Freq: Two times a day (BID) | RESPIRATORY_TRACT | 1 refills | Status: DC

## 2020-01-16 MED ORDER — BUDESONIDE 0.25 MG/2ML IN SUSP: 0.2500 mg | Freq: Two times a day (BID) | RESPIRATORY_TRACT | 12 refills | Status: DC

## 2020-01-16 MED ORDER — GUAIFENESIN-DM 100-10 MG/5ML PO SYRP: 5.0000 mL | ORAL_SOLUTION | ORAL | 0 refills | Status: DC | PRN

## 2020-01-16 MED ORDER — PREDNISONE 20 MG PO TABS: 40.0000 mg | ORAL_TABLET | Freq: Every day | ORAL | 0 refills | Status: DC

## 2020-01-16 MED ORDER — FLUTICASONE PROPIONATE 50 MCG/ACT NA SUSP: 2.0000 | Freq: Every day | NASAL | 1 refills | Status: DC

## 2020-01-16 NOTE — Progress Notes (Signed)
AVS given to patient and explained at the bedside. Medications and follow up appointments have been explained with pt verbalizing understanding.  

## 2020-01-16 NOTE — Discharge Summary (Signed)
Physician Discharge Summary  Monica Wallace RJJ:[id number] DOB: 06-20-1951 DOA: 01/13/2020  PCP: [name], MD  Admit date: 01/13/2020 Discharge date: 01/16/2020  Time spent: 32 minutes  Recommendations for Outpatient Follow-up:  1. Will require 5 days of steroids in the outpatient setting 2. Consider outpatient spirometry referral and referral to allergist/pulmonology 3. Refilled all inhalers including nebulizations and added Pulmicort (she is used this in the past but has not used it recently 4. Recommend Chem-12 and CBC plus differential in about 1 week 5. Additionally please consider TSH in 3 weeks as is on Synthroid for postoperative hypothyroidism 6. Consider an outpatient A1c at next yearly checkup--last one earlier this year was 6.4 7. 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 Reactive airway disease?  Asthma a. Will need definitive outpatient spirometry and I would defer this to her PCP b. Given 1 day of IV Solu-Medrol and transitioned rapidly to oral prednisone for  5-day burst-expect she will improve really well in the outpatient setting c. Her chronic sinus issues will need to be addressed may be with an allergist or pulmonology-she is currently on max therapy of Flonase which will be resumed, Singulair as well as Claritin 2. Morbid obesity BMI 38 a. Possible restrictive component to her breathing b. Outpatient follow-up 3. Mild mitral valve prolapse followed by Dr. Agustin Cree a. Outpatient screening by him b. Last echo 04/2017 was relatively benign 4. Acquired hypothyroidism a. Continue Synthroid 150 b. Will need a TSH in 3 weeks 5. Prediabetes-blood sugars ranging 1 13-1 70 6. HTN a. Not controlled on admission b. Resuming Maxide 1 tablet daily and recheck labs a.m  Procedures:  None  Consultations:  None  Discharge Exam: Vitals:   01/15/20 2104 01/16/20 0511  BP:  139/72  Pulse:  62  Resp:  20  Temp:  98 F (36.7 C)  SpO2: 97% 98%    General: Awake alert coherent no distress breathing comfortably verbalizing in full sentences no accessory muscle use Cardiovascular: S1-S2 no murmur rub or gallop Respiratory: Clinically clear no wheeze no rales no rhonchi Neurologically intact moving all 4 limbs equally  Discharge Instructions   Discharge Instructions    Diet - low sodium heart healthy   Complete by: As directed    Discharge instructions   Complete by: As directed    Take all meds as indicated-Would get spirometry as an OP Would also get labs as an OP   Increase activity slowly   Complete by: As directed      Allergies as of 01/16/2020      Reactions   Levaquin [levofloxacin] Shortness Of Breath   Clindamycin/lincomycin  Pruritus No rash   Gabapentin Itching   Penicillins    Positive on allergy testing 'years ago.' Has never had penicillin. Has tolerated amoxicillin.   Codeine Itching      Medication List    STOP taking these medications   doxycycline 100 MG tablet Commonly known as: VIBRA-TABS    methylPREDNISolone 4 MG tablet Commonly known as: Medrol   tiZANidine 2 MG tablet Commonly known as: ZANAFLEX     TAKE these medications   acetaminophen 325 MG tablet Commonly known as: TYLENOL Take 650 mg by mouth every 6 (six) hours as needed.   albuterol 108 (90 Base) MCG/ACT inhaler Commonly known as: VENTOLIN HFA INHALE 2 PUFFS INTO THE LUNGS EVERY 6 HOURS AS NEEDED FOR WHEEZING OR SHORTNESS OF BREATH What changed: See the new instructions.   budesonide 0.25 MG/2ML nebulizer solution Commonly known as: PULMICORT Take 2 mLs (0.25 mg total) by nebulization 2 (two) times daily.   CVS VIT D 5000 HIGH-POTENCY PO Take 5,000 Units by mouth.   diclofenac sodium 1 % Gel Commonly known as: VOLTAREN Apply 2 g topically 4 (four) times daily.   fluticasone 50 MCG/ACT nasal spray Commonly known as: FLONASE Place 2 sprays into both nostrils daily.   guaiFENesin-dextromethorphan 100-10 MG/5ML syrup Commonly known as: ROBITUSSIN DM Take 5 mLs by mouth every 4 (four) hours as needed for cough.   ipratropium-albuterol 0.5-2.5 (3) MG/3ML Soln Commonly known as: DUONEB Take 3 mLs by nebulization 2 (two) times daily.   loratadine 10 MG tablet Commonly known as: CLARITIN Take 10 mg by mouth in the morning and at bedtime.   montelukast 10 MG tablet Commonly known as: SINGULAIR Take 1 tablet (10 mg total) by mouth daily as needed.   Omega-3 1000 MG Caps Take 1,000 mg by mouth daily.   predniSONE 20 MG tablet Commonly known as: DELTASONE Take 2 tablets (40 mg total) by mouth daily before breakfast.   Synthroid 150 MCG tablet Generic drug: levothyroxine TAKE 1 TABLET(150 MCG) BY MOUTH DAILY BEFORE BREAKFAST What changed: See the new instructions.   triamterene-hydrochlorothiazide 75-50 MG tablet Commonly known as: MAXZIDE TAKE 1 TABLET BY MOUTH DAILY   vitamin C 100 MG tablet Take 100 mg by mouth daily.      Allergies  Allergen Reactions  . Levaquin [Levofloxacin]  Shortness Of Breath  . Clindamycin/Lincomycin     Pruritus No rash  . Gabapentin Itching  . Penicillins     Positive on allergy testing 'years ago.' Has never had penicillin. Has tolerated amoxicillin.  . Codeine Itching      The results of significant diagnostics from this hospitalization (including imaging, microbiology, ancillary and laboratory) are listed below for reference.    Significant Diagnostic Studies: DG Cervical Spine Complete  Result Date: 01/01/2020 CLINICAL DATA:  Posterior neck pain EXAM: CERVICAL SPINE - COMPLETE 4+ VIEW COMPARISON:  None. FINDINGS: Severe cervical degenerative change spanning C3 through C7. These levels demonstrate marked disc space narrowing, sclerosis and endplate osteophytes. There is associated mild cervical kyphotic curvature. Posterior multilevel facet arthropathy noted. No acute osseous finding or malalignment. Normal prevertebral soft tissues. Facets appear aligned. Foramina appear grossly patent. Trachea midline.  Lung apices are clear. Intact odontoid.  Visualized sinuses mastoids are clear. IMPRESSION: Advanced cervical degenerative change most pronounced at C3-C7 as above. No acute finding plain radiography. Electronically Signed   By: Jerilynn Mages.  Shick M.D.   On: 01/01/2020 10:32   DG Chest Portable 1 View  Result Date: 01/13/2020 CLINICAL DATA:  Cough and wheezing EXAM: PORTABLE CHEST 1 VIEW COMPARISON:  09/04/2019 FINDINGS: The heart size and mediastinal contours are within normal limits. Both lungs are clear. The visualized skeletal structures are unremarkable. IMPRESSION: No active disease. Electronically Signed   By: Franchot Gallo M.D.   On: 01/13/2020 12:24    Microbiology: Recent Results (from the past 240 hour(s))  SARS Coronavirus 2 by RT PCR (hospital order, performed in [facility] hospital lab) Nasopharyngeal Nasopharyngeal Swab     Status: None   Collection Time: 01/13/20 12:32 PM   Specimen: Nasopharyngeal Swab  Result Value Ref  Range Status   SARS Coronavirus 2 NEGATIVE NEGATIVE Final    Comment: (NOTE) SARS-CoV-2 target nucleic acids are NOT DETECTED.  The SARS-CoV-2 RNA is generally detectable in upper and lower respiratory specimens during the acute phase of infection. The lowest concentration of SARS-CoV-2 viral copies this assay can detect is 250 copies / mL. A negative result does not preclude SARS-CoV-2 infection and should not be used as the sole basis for treatment or other patient management decisions.  A negative result may occur with improper specimen collection / handling, submission of specimen other than nasopharyngeal swab, presence of viral mutation(s) within the areas targeted by this assay, and inadequate number of viral copies (<250 copies / mL). A negative result must be combined with clinical observations, patient history, and epidemiological information.  Fact Sheet for Patients:   StrictlyIdeas.no  Fact Sheet for Healthcare Providers: BankingDealers.co.za  This test is not yet approved or  cleared by the [place] FDA and has been authorized for detection and/or diagnosis of SARS-CoV-2 by FDA under an Emergency Use Authorization (EUA).  This EUA will remain in effect (meaning this test can be used) for the duration of the COVID-19 declaration under Section 564(b)(1) of the Act, 21 U.S.C. section 360bbb-3(b)(1), unless the authorization is terminated or revoked sooner.  Performed at [facility], [address] [place], [place] [address]      Labs: Basic Metabolic Panel: Recent Labs  Lab 01/13/20 1503 01/14/20 0605 01/15/20 0438 01/16/20 0404  NA 136 138 140 137  K 3.0* 3.6 4.1 3.6  CL 94* 98 102 100  CO2 28 24 27 28   GLUCOSE 113* 142* 170* 128*  BUN 14 13 20 20   CREATININE 0.79 0.82 0.79 0.70  CALCIUM 9.5 9.5 9.6 9.4  MG  --  2.0  --   --    Liver Function Tests: Recent Labs  Lab 01/16/20 0404  AST  21  ALT 21  ALKPHOS 59  BILITOT 0.5  PROT 7.1  ALBUMIN 3.5   No results for input(s): LIPASE, AMYLASE in the last 168 hours. No results for input(s): AMMONIA in the last 168 hours. CBC: Recent Labs  Lab 01/13/20 1503 01/14/20 0605 01/16/20 0404  WBC 11.8* 11.6* 16.2*  NEUTROABS 5.8 8.6* 10.0*  HGB 13.1 13.8 13.5  HCT 40.6 41.7 41.0  MCV 92.3 90.7 90.9  PLT 329 331 321   Cardiac Enzymes: No results for input(s): CKTOTAL, CKMB, CKMBINDEX, TROPONINI in the last 168 hours. BNP: BNP (last 3 results) No results for input(s): BNP in the last 8760 hours.  ProBNP (last 3 results) No results for input(s): PROBNP in the last 8760 hours.  CBG: No results for input(s): GLUCAP in the last 168 hours.     Signed:  Nita Sells MD   [facility] 01/16/2020, 9:18 AM

## 2020-01-22 NOTE — Progress Notes (Signed)
[place] at [facility] [address] [place], [place] [address] [phone]  Date:  01/23/2020   Name:  Monica Wallace   DOB:  24-Mar-1951   MRN:  [id number]  PCP:  Monica Lukes, MD    Chief Complaint: Hospitalization Follow-up   History of Present Illness:  Monica Wallace is a 69 y.o. very pleasant female patient who presents with the following:  Primary patient of my partner Monica Wallace, here today to follow-up from recent hospital admission with bronchitis/reactive airway disease.  She did have COVID-19 in fall of 2020  Admit date: 01/13/2020 Discharge date: 01/16/2020 Time spent: 32 minutes Recommendations for Outpatient Follow-up:  1. Will require 5 days of steroids in the outpatient setting 2. Consider outpatient spirometry referral and referral to allergist/pulmonology 3. Refilled all inhalers including nebulizations and added Pulmicort (she is used this in the past but has not used it recently 4. Recommend Chem-12 and CBC plus differential in about 1 week 5. Additionally please consider TSH in 3 weeks as is on Synthroid for postoperative hypothyroidism 6. Consider an outpatient A1c at next yearly checkup--last one earlier this year was 6.4 7. 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 Reactive airway disease? Asthma a. Will need definitive outpatient spirometry and I would defer this to her PCP b. Given 1 day of IV Solu-Medrol and transitioned rapidly to oral prednisone for 5-day burst-expect she will improve really well in the outpatient setting c. Her chronic sinus issues will need to be addressed may be with an allergist or pulmonology-she is currently on max therapy of Flonase which will be resumed, Singulair as well as Claritin 9. Morbid obesity BMI 38 a. Possible restrictive component to her breathing b. Outpatient follow-up 10. Mild mitral valve prolapse followed by Monica Wallace a. Outpatient screening by him b. Last echo 04/2017 was relatively benign 11. Acquired hypothyroidism a. Continue Synthroid 150 b. Will need a TSH in 3 weeks 12. Prediabetes-blood sugars ranging 1 13-1 70 13. HTN a. 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  She does not wish to start on another course of antibiotics at this time, would rather discuss with ENT first  She did take a course of doxycycline in mid June as well for parotiditis No fever Still has a cough but this is improved  She has not noted wheezing   Overall she is feeling better, but still not 100% well  She is currently using Pulmicort nebs twice a day, as well as DuoNeb's twice daily-these are helping, but neb treatments are not very convenient  She wonders about using an occasional oral decongestant for her symptoms  She cares for a 15-year-old child who was born quite premature and  has associated health problems.  He is doing well, they hope that he will be able to attend school in the next year or so-she is clearly very attached to this young boy  Lab Results  Component Value Date   HGBA1C 6.4 10/08/2019     Patient Active Problem List   Diagnosis Date Noted  . Acute bronchitis 01/13/2020  . Otitis media 09/30/2019  . Hip pain, right 09/30/2019  . COVID-19 09/30/2019  . Educated about COVID-19 virus infection 12/05/2018  . High risk heterosexual behavior 11/20/2017  . Baker's cyst of knee, right 02/16/2017  . Insomnia 07/23/2016  . Hypokalemia 07/23/2016  . Controlled type 2 diabetes mellitus without complication, without long-term current use of insulin ([place]) 01/24/2016  . Skin lesion of right leg 01/24/2016  . Preventative health care 01/18/2016  . Left-sided low back pain with left-sided sciatica 10/07/2015  . Obesity (BMI 30-39.9) 06/05/2015  . OSA (obstructive sleep apnea) 02/02/2015  . Palpitations 09/19/2014  . Plantar fasciitis of right foot 11/19/2013  . Tenosynovitis of foot and ankle 11/19/2013  . Pronation deformity of ankle, acquired 11/19/2013  . DJD (degenerative joint disease) 08/26/2013  . Fibrocystic breast 08/26/2013  . HTN (hypertension) 08/26/2013  . GERD (gastroesophageal reflux disease) 08/26/2013  . S/P hysterectomy with oophorectomy 08/26/2013  . Hypothyroidism 08/26/2013  . Chronic hoarseness 08/26/2013  . Colon polyp 08/26/2013  . Hiatal hernia 08/26/2013  . Irritable bowel syndrome 08/26/2013  . Asthma 08/26/2013  . Allergic rhinitis 08/26/2013  . Hyperlipidemia 08/22/2013  . Migraine 08/22/2013  . Thyroid cancer ([place]) 08/22/2013    Past Medical History:  Diagnosis Date  . Arthritis   . Baker's cyst of knee, right 02/16/2017  . Cancer ([place])    thyroid (2002)  . Cystic breast   . GERD (gastroesophageal reflux disease)   . Hepatic cyst 02/02/2015  . Hyperlipidemia   . Hypertension   . Insomnia 07/23/2016  .  Menopause   . Migraine   . Palpitations   . Preventative health care 01/18/2016  . Sleep apnea 02/02/2015  . Thyroid disease     Past Surgical History:  Procedure Laterality Date  . ABDOMINAL HYSTERECTOMY     took right ovary and uterus  . APPENDECTOMY    . CARPAL TUNNEL RELEASE Right   . CESAREAN SECTION    . CHOLECYSTECTOMY    . EYE SURGERY Bilateral 2017   cataracts  . KNEE ARTHROPLASTY      Social History   Tobacco Use  . Smoking status: Never Smoker  . Smokeless tobacco: Never Used  Vaping Use  . Vaping Use: Never used  Substance Use Topics  . Alcohol use: No    Alcohol/week: 0.0 standard drinks  . Drug use: No    Family History  Problem Relation Age of Onset  . Arthritis Mother   . Transient ischemic attack Mother   . Hypertension Mother   . Hyperlipidemia Mother   . Diabetes Father   . Heart disease Father   . Arthritis Father   . Kidney disease Father   . Hypertension Father   . Hyperlipidemia Father   . Cancer Sister 74       pancreatic  . Fibromyalgia  Sister   . Asthma Maternal Grandfather        smoker  . Depression Maternal Grandfather   . Dementia Paternal Grandmother   . Diabetes Paternal Grandfather   . Hypertension Brother   . Scoliosis Daughter        had rods placed and removed  . Arthritis Sister   . Gout Sister   . GER disease Sister   . Cancer Sister        breast cancer  . Breast cancer Sister 70    Allergies  Allergen Reactions  . Levaquin [Levofloxacin] Shortness Of Breath  . Clindamycin/Lincomycin     Pruritus No rash  . Gabapentin Itching  . Penicillins     Positive on allergy testing 'years ago.' Has never had penicillin. Has tolerated amoxicillin.  . Codeine Itching    Medication list has been reviewed and updated.  Current Outpatient Medications on File Prior to Visit  Medication Sig Dispense Refill  . acetaminophen (TYLENOL) 325 MG tablet Take 650 mg by mouth every 6 (six) hours as needed.    Marland Kitchen albuterol  (VENTOLIN HFA) 108 (90 Base) MCG/ACT inhaler INHALE 2 PUFFS INTO THE LUNGS EVERY 6 HOURS AS NEEDED FOR WHEEZING OR SHORTNESS OF BREATH (Patient taking differently: Inhale 2 puffs into the lungs every 6 (six) hours as needed for wheezing or shortness of breath. ) 25.5 g 1  . Ascorbic Acid (VITAMIN C) 100 MG tablet Take 100 mg by mouth daily.    . budesonide (PULMICORT) 0.25 MG/2ML nebulizer solution Take 2 mLs (0.25 mg total) by nebulization 2 (two) times daily. 60 mL 12  . Cholecalciferol (CVS VIT D 5000 HIGH-POTENCY PO) Take 5,000 Units by mouth.    . fluticasone (FLONASE) 50 MCG/ACT nasal spray Place 2 sprays into both nostrils daily. 48 g 1  . ipratropium-albuterol (DUONEB) 0.5-2.5 (3) MG/3ML SOLN Take 3 mLs by nebulization 2 (two) times daily. 360 mL 1  . loratadine (CLARITIN) 10 MG tablet Take 10 mg by mouth in the morning and at bedtime.     . montelukast (SINGULAIR) 10 MG tablet Take 1 tablet (10 mg total) by mouth daily as needed. 90 tablet 3  . Omega-3 1000 MG CAPS Take 1,000 mg by mouth daily.    Marland Kitchen SYNTHROID 150 MCG tablet TAKE 1 TABLET(150 MCG) BY MOUTH DAILY BEFORE BREAKFAST (Patient taking differently: Take 150 mcg by mouth daily before breakfast. ) 90 tablet 1  . triamterene-hydrochlorothiazide (MAXZIDE) 75-50 MG tablet TAKE 1 TABLET BY MOUTH DAILY 90 tablet 1   No current facility-administered medications on file prior to visit.    Review of Systems:  As per HPI- otherwise negative.   Physical Examination: Vitals:   01/23/20 1611  BP: 124/80  Pulse: 88  Resp: 18  Temp: 98 F (36.7 C)  SpO2: 98%   Vitals:   01/23/20 1611  Weight: 205 lb (93 kg)  Height: 5\' 1"  (1.549 m)   Body mass index is 38.73 kg/m. Ideal Body Weight: Weight in (lb) to have BMI = 25: 132  GEN: no acute distress.  Obese, otherwise looks well HEENT: Atraumatic, Normocephalic. Bilateral TM wnl, oropharynx normal.  PEERL,EOMI.   Ears and Nose: No external deformity.  Nasal cavity is inflamed and  congested CV: RRR, No M/G/R. No JVD. No thrill. No extra heart sounds. PULM: CTA B, no wheezes, crackles, rhonchi. No retractions. No resp. distress. No accessory muscle use.  Lungs are quite clear at the time of my exam ABD: S,  NT, ND, +BS. No rebound. No HSM. EXTR: No c/c/e PSYCH: Normally interactive. Conversant.    Assessment and Plan: Hospital discharge follow-up  Acute bronchitis, unspecified organism - Plan: budesonide (PULMICORT) 180 MCG/ACT inhaler  Following up today from recent hospitalization for bronchitis/reactive airway disease.  Patient was treated with oral steroids which she has since completed.  She is continuing to use inhaled steroids as well as DuoNebs.  She is feeling quite a bit better, but is not sure where to go from here.  She has not previously been on any medication for asthma or RAD.  Advised her that we can change from Pulmicort nebs to her inhaler which would be more convenient.  I would like her to continue to use this for about 1 month, then we can reassess.  Can also continue duo nebs on an as-needed basis She will be seeing ENT in a few days for consultation.  We will also consider having her seen by an allergist, we would both like to see what ENT thinks first  Discussed getting blood work today, the patient is seeing Monica Wallace for her routine checkup on the 22nd and would rather delay labs till that visit which is reasonable  She will plan to follow-up with Monica Wallace as mentioned above unless anything changes or gets worse in the meantime This visit occurred during the SARS-CoV-2 public health emergency.  Safety protocols were in place, including screening questions prior to the visit, additional usage of staff PPE, and extensive cleaning of exam room while observing appropriate contact time as indicated for disinfecting solutions.    Signed Lamar Blinks, MD

## 2020-01-23 ENCOUNTER — Other Ambulatory Visit: Payer: Self-pay

## 2020-01-23 ENCOUNTER — Ambulatory Visit (INDEPENDENT_AMBULATORY_CARE_PROVIDER_SITE_OTHER): Payer: Medicare Other | Admitting: Family Medicine

## 2020-01-23 VITALS — BP 124/80 | HR 88 | Temp 98.0°F | Resp 18 | Ht 61.0 in | Wt 205.0 lb

## 2020-01-23 DIAGNOSIS — J209 Acute bronchitis, unspecified: Secondary | ICD-10-CM | POA: Diagnosis not present

## 2020-01-23 DIAGNOSIS — Z09 Encounter for follow-up examination after completed treatment for conditions other than malignant neoplasm: Secondary | ICD-10-CM

## 2020-01-23 MED ORDER — BUDESONIDE 180 MCG/ACT IN AEPB: 1.0000 | INHALATION_SPRAY | Freq: Two times a day (BID) | RESPIRATORY_TRACT | 3 refills | Status: DC

## 2020-01-23 NOTE — Patient Instructions (Addendum)
It was good to see you today, I am glad you are out of the hospital and feeling somewhat better Continue the duo nebs as needed, we will stop the Pulmicort nebulizer and change to a Pulmicort inhaler; 1 or 2 puffs twice a day Hopefully this will not be a long-term thing for you!  When you see Dr. Charlett Blake later this summer we can recheck your labs Plan to see ENT next week-they may suggest another antibiotic or further steroids, we will see what they say  You might try Afrin nasal spray for 3 or 4 days for nasal congestion Let me know if I can do anything else to help

## 2020-01-27 ENCOUNTER — Ambulatory Visit (INDEPENDENT_AMBULATORY_CARE_PROVIDER_SITE_OTHER): Payer: Medicare Other | Admitting: Otolaryngology

## 2020-01-27 ENCOUNTER — Encounter (INDEPENDENT_AMBULATORY_CARE_PROVIDER_SITE_OTHER): Payer: Self-pay | Admitting: Otolaryngology

## 2020-01-27 ENCOUNTER — Other Ambulatory Visit: Payer: Self-pay

## 2020-01-27 VITALS — Temp 97.3°F

## 2020-01-27 DIAGNOSIS — J310 Chronic rhinitis: Secondary | ICD-10-CM

## 2020-01-27 NOTE — Progress Notes (Signed)
HPI: Monica Wallace is a 69 y.o. female who presents is referred by Mackie Pai, PA for evaluation of allergies and chronic sinus complaints.  Patient has had a long history of bad seasonal allergies.  She had allergy immunotherapy when she was in her 51s.  More recently has complained of pressure in her head clogged up ears and some pressure in the right mastoid area.  This began after having exposure to a sick child where she had to me admitted to [facility] because of wheezing.  She is doing better presently.  She is presently using Flonase, Singulair and Claritin on a as needed basis. She has had no fever and denies any recent yellow-green discharge from her nose. She does complain of chronic postnasal drainage.  She has just recently completed a course of antibiotics.  She is apparently allergic to penicillins and clindamycin..  Past Medical History:  Diagnosis Date  . Arthritis   . Baker's cyst of knee, right 02/16/2017  . Cancer ([place])    thyroid (2002)  . Cystic breast   . GERD (gastroesophageal reflux disease)   . Hepatic cyst 02/02/2015  . Hyperlipidemia   . Hypertension   . Insomnia 07/23/2016  . Menopause   . Migraine   . Palpitations   . Preventative health care 01/18/2016  . Sleep apnea 02/02/2015  . Thyroid disease    Past Surgical History:  Procedure Laterality Date  . ABDOMINAL HYSTERECTOMY     took right ovary and uterus  . APPENDECTOMY    . CARPAL TUNNEL RELEASE Right   . CESAREAN SECTION    . CHOLECYSTECTOMY    . EYE SURGERY Bilateral 2017   cataracts  . KNEE ARTHROPLASTY     Social History   Socioeconomic History  . Marital status: Divorced    Spouse name: Not on file  . Number of children: Not on file  . Years of education: Not on file  . Highest education level: Not on file  Occupational History  . Not on file  Tobacco Use  . Smoking status: Never Smoker  . Smokeless tobacco: Never Used  Vaping Use  . Vaping Use: Never used  Substance and Sexual  Activity  . Alcohol use: No    Alcohol/week: 0.0 standard drinks  . Drug use: No  . Sexual activity: Yes    Birth control/protection: Other-see comments, Surgical    Comment:  boyfriend  Other Topics Concern  . Not on file  Social History Narrative  . Not on file   Social Determinants of Health   Financial Resource Strain: Low Risk   . Difficulty of Paying Living Expenses: Not hard at all  Food Insecurity:   . Worried About Charity fundraiser in the Last Year:   . Arboriculturist in the Last Year:   Transportation Needs:   . Film/video editor (Medical):   Marland Kitchen Lack of Transportation (Non-Medical):   Physical Activity:   . Days of Exercise per Week:   . Minutes of Exercise per Session:   Stress:   . Feeling of Stress :   Social Connections:   . Frequency of Communication with Friends and Family:   . Frequency of Social Gatherings with Friends and Family:   . Attends Religious Services:   . Active Member of Clubs or Organizations:   . Attends Archivist Meetings:   Marland Kitchen Marital Status:    Family History  Problem Relation Age of Onset  . Arthritis Mother   .  Transient ischemic attack Mother   . Hypertension Mother   . Hyperlipidemia Mother   . Diabetes Father   . Heart disease Father   . Arthritis Father   . Kidney disease Father   . Hypertension Father   . Hyperlipidemia Father   . Cancer Sister 61       pancreatic  . Fibromyalgia Sister   . Asthma Maternal Grandfather        smoker  . Depression Maternal Grandfather   . Dementia Paternal Grandmother   . Diabetes Paternal Grandfather   . Hypertension Brother   . Scoliosis Daughter        had rods placed and removed  . Arthritis Sister   . Gout Sister   . GER disease Sister   . Cancer Sister        breast cancer  . Breast cancer Sister 11   Allergies  Allergen Reactions  . Levaquin [Levofloxacin] Shortness Of Breath  . Clindamycin/Lincomycin     Pruritus No rash  . Gabapentin Itching  .  Penicillins     Positive on allergy testing 'years ago.' Has never had penicillin. Has tolerated amoxicillin.  . Codeine Itching   Prior to Admission medications   Medication Sig Start Date End Date Taking? Authorizing Provider  acetaminophen (TYLENOL) 325 MG tablet Take 650 mg by mouth every 6 (six) hours as needed.   Yes [provider]  albuterol (VENTOLIN HFA) 108 (90 Base) MCG/ACT inhaler INHALE 2 PUFFS INTO THE LUNGS EVERY 6 HOURS AS NEEDED FOR WHEEZING OR SHORTNESS OF BREATH Patient taking differently: Inhale 2 puffs into the lungs every 6 (six) hours as needed for wheezing or shortness of breath.  10/01/19  Yes [name], MD  Ascorbic Acid (VITAMIN C) 100 MG tablet Take 100 mg by mouth daily.   Yes [provider]  budesonide (PULMICORT) 0.25 MG/2ML nebulizer solution Take 2 mLs (0.25 mg total) by nebulization 2 (two) times daily. 01/16/20  Yes Nita Sells, MD  budesonide (PULMICORT) 180 MCG/ACT inhaler Inhale 1-2 puffs into the lungs 2 (two) times daily. 01/23/20  Yes Copland, Gay Filler, MD  Cholecalciferol ([employer] VIT D 5000 HIGH-POTENCY PO) Take 5,000 Units by mouth.   Yes [provider]  fluticasone (FLONASE) 50 MCG/ACT nasal spray Place 2 sprays into both nostrils daily. 01/16/20  Yes Nita Sells, MD  ipratropium-albuterol (DUONEB) 0.5-2.5 (3) MG/3ML SOLN Take 3 mLs by nebulization 2 (two) times daily. 01/16/20  Yes Nita Sells, MD  loratadine (CLARITIN) 10 MG tablet Take 10 mg by mouth in the morning and at bedtime.    Yes [provider]  montelukast (SINGULAIR) 10 MG tablet Take 1 tablet (10 mg total) by mouth daily as needed. 11/04/19  Yes [name], MD  Omega-3 1000 MG CAPS Take 1,000 mg by mouth daily.   Yes [provider]  SYNTHROID 150 MCG tablet TAKE 1 TABLET(150 MCG) BY MOUTH DAILY BEFORE BREAKFAST Patient taking differently: Take 150 mcg by mouth daily before breakfast.  12/18/19  Yes [name], MD   triamterene-hydrochlorothiazide (MAXZIDE) 75-50 MG tablet TAKE 1 TABLET BY MOUTH DAILY 12/18/19  Yes [name], MD     Positive ROS: Otherwise negative  All other systems have been reviewed and were otherwise negative with the exception of those mentioned in the HPI and as above.  Physical Exam: Constitutional: Alert, well-appearing, no acute distress Ears: External ears without lesions or tenderness. Ear canals are clear bilaterally.  TMs are  clear bilaterally with good mobility on pneumatic otoscopy with no middle ear effusion noted.  On hearing screening with the [phone] tuning fork she heard well in both ears with AC > BC bilaterally. Nasal: External nose without lesions. Septum midline with mild rhinitis..  Both middle meatus regions are clear with no clinical evidence of active infection.  Only clear mucus noted within the nasal cavity. Oral: Lips and gums without lesions. Tongue and palate mucosa without lesions. Posterior oropharynx clear. Neck: No palpable adenopathy or masses.  No tenderness or swelling over the mastoid region on either side Respiratory: Breathing comfortably  Skin: No facial/neck lesions or rash noted.  Procedures  Assessment: Chronic rhinitis. Presently no evidence of acute infection or mastoid disease.  Plan: Agree with present therapy including regular use of nasal steroid spray, Singulair and Claritin as needed.  Saline rinses as needed which she has already done. Also discussed with her concerning seeing allergy and asthma clinic as she has reactive airway disease and they may be able to to testing and possible treatment if she has any wheezing to keep her out of the hospital in the future.   Radene Journey, MD   CC:

## 2020-02-06 ENCOUNTER — Ambulatory Visit (INDEPENDENT_AMBULATORY_CARE_PROVIDER_SITE_OTHER): Payer: Medicare Other | Admitting: Family Medicine

## 2020-02-06 ENCOUNTER — Encounter: Payer: Self-pay | Admitting: Family Medicine

## 2020-02-06 ENCOUNTER — Other Ambulatory Visit: Payer: Self-pay

## 2020-02-06 VITALS — BP 120/70 | HR 73 | Temp 98.5°F | Resp 12 | Ht 61.0 in | Wt 207.4 lb

## 2020-02-06 DIAGNOSIS — J329 Chronic sinusitis, unspecified: Secondary | ICD-10-CM

## 2020-02-06 DIAGNOSIS — Z0000 Encounter for general adult medical examination without abnormal findings: Secondary | ICD-10-CM

## 2020-02-06 DIAGNOSIS — M542 Cervicalgia: Secondary | ICD-10-CM | POA: Diagnosis not present

## 2020-02-06 DIAGNOSIS — J309 Allergic rhinitis, unspecified: Secondary | ICD-10-CM | POA: Diagnosis not present

## 2020-02-06 DIAGNOSIS — I10 Essential (primary) hypertension: Secondary | ICD-10-CM

## 2020-02-06 DIAGNOSIS — J209 Acute bronchitis, unspecified: Secondary | ICD-10-CM

## 2020-02-06 DIAGNOSIS — E119 Type 2 diabetes mellitus without complications: Secondary | ICD-10-CM | POA: Diagnosis not present

## 2020-02-06 DIAGNOSIS — E782 Mixed hyperlipidemia: Secondary | ICD-10-CM | POA: Diagnosis not present

## 2020-02-06 DIAGNOSIS — E890 Postprocedural hypothyroidism: Secondary | ICD-10-CM | POA: Diagnosis not present

## 2020-02-06 DIAGNOSIS — M5410 Radiculopathy, site unspecified: Secondary | ICD-10-CM | POA: Diagnosis not present

## 2020-02-06 DIAGNOSIS — M62838 Other muscle spasm: Secondary | ICD-10-CM | POA: Diagnosis not present

## 2020-02-06 LAB — CBC
HCT: 39 % (ref 36.0–46.0)
Hemoglobin: 13 g/dL (ref 12.0–15.0)
MCHC: 33.5 g/dL (ref 30.0–36.0)
MCV: 91.5 fl (ref 78.0–100.0)
Platelets: 299 10*3/uL (ref 150.0–400.0)
RBC: 4.26 Mil/uL (ref 3.87–5.11)
RDW: 14.2 % (ref 11.5–15.5)
WBC: 8.7 10*3/uL (ref 4.0–10.5)

## 2020-02-06 LAB — LIPID PANEL
Cholesterol: 219 mg/dL — ABNORMAL HIGH (ref 0–200)
HDL: 50.8 mg/dL (ref >39.00)
NonHDL: 168.1
Total CHOL/HDL Ratio: 4
Triglycerides: 208 mg/dL — ABNORMAL HIGH (ref 0.0–149.0)
VLDL: 41.6 mg/dL — ABNORMAL HIGH (ref 0.0–40.0)

## 2020-02-06 LAB — TSH: TSH: 0.47 u[IU]/mL (ref 0.35–4.50)

## 2020-02-06 LAB — COMPREHENSIVE METABOLIC PANEL
ALT: 17 U/L (ref 0–35)
AST: 17 U/L (ref 0–37)
Albumin: 3.9 g/dL (ref 3.5–5.2)
Alkaline Phosphatase: 74 U/L (ref 39–117)
BUN: 13 mg/dL (ref 6–23)
CO2: 34 mEq/L — ABNORMAL HIGH (ref 19–32)
Calcium: 10 mg/dL (ref 8.4–10.5)
Chloride: 98 mEq/L (ref 96–112)
Creatinine, Ser: 0.8 mg/dL (ref 0.40–1.20)
GFR: 85.57 mL/min (ref >60.00)
Glucose, Bld: 93 mg/dL (ref 70–99)
Potassium: 3.9 mEq/L (ref 3.5–5.1)
Sodium: 138 mEq/L (ref 135–145)
Total Bilirubin: 0.2 mg/dL (ref 0.2–1.2)
Total Protein: 6.7 g/dL (ref 6.0–8.3)

## 2020-02-06 LAB — MAGNESIUM: Magnesium: 2 mg/dL (ref 1.5–2.5)

## 2020-02-06 LAB — LDL CHOLESTEROL, DIRECT: Direct LDL: 132 mg/dL

## 2020-02-06 LAB — HEMOGLOBIN A1C: Hgb A1c MFr Bld: 6.6 % — ABNORMAL HIGH (ref 4.6–6.5)

## 2020-02-06 MED ORDER — ONETOUCH DELICA PLUS LANCET33G MISC: 1 refills | Status: DC

## 2020-02-06 MED ORDER — TIZANIDINE HCL 2 MG PO TABS: 1.0000 mg | ORAL_TABLET | Freq: Two times a day (BID) | ORAL | 1 refills | Status: DC | PRN

## 2020-02-06 MED ORDER — ONETOUCH VERIO VI STRP: ORAL_STRIP | 1 refills | Status: DC

## 2020-02-06 MED ORDER — ONETOUCH VERIO FLEX SYSTEM W/DEVICE KIT: PACK | 0 refills | Status: AC

## 2020-02-06 NOTE — Patient Instructions (Signed)
Kegel Exercises  Kegel exercises can help strengthen your pelvic floor muscles. The pelvic floor is a group of muscles that support your rectum, small intestine, and bladder. In females, pelvic floor muscles also help support the womb (uterus). These muscles help you control the flow of urine and stool. Kegel exercises are painless and simple, and they do not require any equipment. Your provider may suggest Kegel exercises to:  Improve bladder and bowel control.  Improve sexual response.  Improve weak pelvic floor muscles after surgery to remove the uterus (hysterectomy) or pregnancy (females).  Improve weak pelvic floor muscles after prostate gland removal or surgery (males). Kegel exercises involve squeezing your pelvic floor muscles, which are the same muscles you squeeze when you try to stop the flow of urine or keep from passing gas. The exercises can be done while sitting, standing, or lying down, but it is best to vary your position. Exercises How to do Kegel exercises: 1. Squeeze your pelvic floor muscles tight. You should feel a tight lift in your rectal area. If you are a female, you should also feel a tightness in your vaginal area. Keep your stomach, buttocks, and legs relaxed. 2. Hold the muscles tight for up to 10 seconds. 3. Breathe normally. 4. Relax your muscles. 5. Repeat as told by your health care provider. Repeat this exercise daily as told by your health care provider. Continue to do this exercise for at least 4-6 weeks, or for as long as told by your health care provider. You may be referred to a physical therapist who can help you learn more about how to do Kegel exercises. Depending on your condition, your health care provider may recommend:  Varying how long you squeeze your muscles.  Doing several sets of exercises every day.  Doing exercises for several weeks.  Making Kegel exercises a part of your regular exercise routine. This information is not intended  to replace advice given to you by your health care provider. Make sure you discuss any questions you have with your health care provider. Document Revised: 02/21/2018 Document Reviewed: 02/21/2018 Elsevier Patient Education  [place]. Sinusitis, Adult Sinusitis is inflammation of your sinuses. Sinuses are hollow spaces in the bones around your face. Your sinuses are located:  Around your eyes.  In the middle of your forehead.  Behind your nose.  In your cheekbones. Mucus normally drains out of your sinuses. When your nasal tissues become inflamed or swollen, mucus can become trapped or blocked. This allows bacteria, viruses, and fungi to grow, which leads to infection. Most infections of the sinuses are caused by a virus. Sinusitis can develop quickly. It can last for up to 4 weeks (acute) or for more than 12 weeks (chronic). Sinusitis often develops after a cold. What are the causes? This condition is caused by anything that creates swelling in the sinuses or stops mucus from draining. This includes:  Allergies.  Asthma.  Infection from bacteria or viruses.  Deformities or blockages in your nose or sinuses.  Abnormal growths in the nose (nasal polyps).  Pollutants, such as chemicals or irritants in the air.  Infection from fungi (rare). What increases the risk? You are more likely to develop this condition if you:  Have a weak body defense system (immune system).  Do a lot of swimming or diving.  Overuse nasal sprays.  Smoke. What are the signs or symptoms? The main symptoms of this condition are pain and a feeling of pressure around the affected  sinuses. Other symptoms include:  Stuffy nose or congestion.  Thick drainage from your nose.  Swelling and warmth over the affected sinuses.  Headache.  Upper toothache.  A cough that may get worse at night.  Extra mucus that collects in the throat or the back of the nose (postnasal drip).  Decreased sense  of smell and taste.  Fatigue.  A fever.  Sore throat.  Bad breath. How is this diagnosed? This condition is diagnosed based on:  Your symptoms.  Your medical history.  A physical exam.  Tests to find out if your condition is acute or chronic. This may include: ? Checking your nose for nasal polyps. ? Viewing your sinuses using a device that has a light (endoscope). ? Testing for allergies or bacteria. ? Imaging tests, such as an MRI or CT scan. In rare cases, a bone biopsy may be done to rule out more serious types of fungal sinus disease. How is this treated? Treatment for sinusitis depends on the cause and whether your condition is chronic or acute.  If caused by a virus, your symptoms should go away on their own within 10 days. You may be given medicines to relieve symptoms. They include: ? Medicines that shrink swollen nasal passages (topical intranasal decongestants). ? Medicines that treat allergies (antihistamines). ? A spray that eases inflammation of the nostrils (topical intranasal corticosteroids). ? Rinses that help get rid of thick mucus in your nose (nasal saline washes).  If caused by bacteria, your health care provider may recommend waiting to see if your symptoms improve. Most bacterial infections will get better without antibiotic medicine. You may be given antibiotics if you have: ? A severe infection. ? A weak immune system.  If caused by narrow nasal passages or nasal polyps, you may need to have surgery. Follow these instructions at home: Medicines  Take, use, or apply over-the-counter and prescription medicines only as told by your health care provider. These may include nasal sprays.  If you were prescribed an antibiotic medicine, take it as told by your health care provider. Do not stop taking the antibiotic even if you start to feel better. Hydrate and humidify   Drink enough fluid to keep your urine pale yellow. Staying hydrated will help to  thin your mucus.  Use a cool mist humidifier to keep the humidity level in your home above 50%.  Inhale steam for 10-15 minutes, 3-4 times a day, or as told by your health care provider. You can do this in the bathroom while a hot shower is running.  Limit your exposure to cool or dry air. Rest  Rest as much as possible.  Sleep with your head raised (elevated).  Make sure you get enough sleep each night. General instructions   Apply a warm, moist washcloth to your face 3-4 times a day or as told by your health care provider. This will help with discomfort.  Wash your hands often with soap and water to reduce your exposure to germs. If soap and water are not available, use hand sanitizer.  Do not smoke. Avoid being around people who are smoking (secondhand smoke).  Keep all follow-up visits as told by your health care provider. This is important. Contact a health care provider if:  You have a fever.  Your symptoms get worse.  Your symptoms do not improve within 10 days. Get help right away if:  You have a severe headache.  You have persistent vomiting.  You have severe pain or  swelling around your face or eyes.  You have vision problems.  You develop confusion.  Your neck is stiff.  You have trouble breathing. Summary  Sinusitis is soreness and inflammation of your sinuses. Sinuses are hollow spaces in the bones around your face.  This condition is caused by nasal tissues that become inflamed or swollen. The swelling traps or blocks the flow of mucus. This allows bacteria, viruses, and fungi to grow, which leads to infection.  If you were prescribed an antibiotic medicine, take it as told by your health care provider. Do not stop taking the antibiotic even if you start to feel better.  Keep all follow-up visits as told by your health care provider. This is important. This information is not intended to replace advice given to you by your health care provider.  Make sure you discuss any questions you have with your health care provider. Document Revised: 12/04/2017 Document Reviewed: 12/04/2017 Elsevier Patient Education  [place].

## 2020-02-06 NOTE — Assessment & Plan Note (Signed)
Patient encouraged to maintain heart healthy diet, regular exercise, adequate sleep. Consider daily probiotics. Take medications as prescribed. Labs ordered and reviewed.MGM was August 2020, Dexa scan August 2020. Colonoscopy 2016 showed no polyps.

## 2020-02-06 NOTE — Progress Notes (Addendum)
Patient ID: Monica Wallace, female   DOB: 11-May-1951, 69 y.o.   MRN: [id number]   Subjective:    Patient ID: Monica Wallace, female    DOB: 1951/03/07, 69 y.o.   MRN: [id number]  Chief Complaint  Patient presents with  . Annual Exam    HPI Patient is in today for annual preventative exam and follow up after recent hospitalization for respiratory illness. She was diagnosed with both a sinusitis and a bronchitis. Since returning home she feels much better. She is noting persistent and worsening neck pain with radicular symptoms down right arm at times. No recent fall or injury. Denies CP/palp/SOB/HA/fevers/GI or GU c/o. Taking meds as prescribed  Past Medical History:  Diagnosis Date  . Arthritis   . Baker's cyst of knee, right 02/16/2017  . Cancer ([place])    thyroid (2002)  . Cystic breast   . GERD (gastroesophageal reflux disease)   . Hepatic cyst 02/02/2015  . Hyperlipidemia   . Hypertension   . Insomnia 07/23/2016  . Menopause   . Migraine   . Palpitations   . Preventative health care 01/18/2016  . Sleep apnea 02/02/2015  . Thyroid disease     Past Surgical History:  Procedure Laterality Date  . ABDOMINAL HYSTERECTOMY     took right ovary and uterus  . APPENDECTOMY    . CARPAL TUNNEL RELEASE Right   . CESAREAN SECTION    . CHOLECYSTECTOMY    . EYE SURGERY Bilateral 2017   cataracts  . KNEE ARTHROPLASTY      Family History  Problem Relation Age of Onset  . Arthritis Mother   . Transient ischemic attack Mother   . Hypertension Mother   . Hyperlipidemia Mother   . Diabetes Father   . Heart disease Father   . Arthritis Father   . Kidney disease Father   . Hypertension Father   . Hyperlipidemia Father   . Cancer Sister 60       pancreatic  . Fibromyalgia Sister   . Asthma Maternal Grandfather        smoker  . Depression Maternal Grandfather   . Dementia Paternal Grandmother   . Diabetes Paternal Grandfather   . Hypertension Brother   . Scoliosis Daughter        had rods  placed and removed  . Arthritis Sister   . Gout Sister   . GER disease Sister   . Cancer Sister        breast cancer  . Breast cancer Sister 31    Social History   Socioeconomic History  . Marital status: Divorced    Spouse name: Not on file  . Number of children: Not on file  . Years of education: Not on file  . Highest education level: Not on file  Occupational History  . Not on file  Tobacco Use  . Smoking status: Never Smoker  . Smokeless tobacco: Never Used  Vaping Use  . Vaping Use: Never used  Substance and Sexual Activity  . Alcohol use: Yes    Alcohol/week: 0.0 standard drinks    Comment: very rare once or twice a year  . Drug use: No  . Sexual activity: Yes    Birth control/protection: Other-see comments, Surgical    Comment:  boyfriend  Other Topics Concern  . Not on file  Social History Narrative  . Not on file   Social Determinants of Health   Financial Resource Strain: Low Risk   . Difficulty of Paying Living  Expenses: Not hard at all  Food Insecurity:   . Worried About Charity fundraiser in the Last Year:   . Arboriculturist in the Last Year:   Transportation Needs:   . Film/video editor (Medical):   Marland Kitchen Lack of Transportation (Non-Medical):   Physical Activity:   . Days of Exercise per Week:   . Minutes of Exercise per Session:   Stress:   . Feeling of Stress :   Social Connections:   . Frequency of Communication with Friends and Family:   . Frequency of Social Gatherings with Friends and Family:   . Attends Religious Services:   . Active Member of Clubs or Organizations:   . Attends Archivist Meetings:   Marland Kitchen Marital Status:   Intimate Partner Violence:   . Fear of Current or Ex-Partner:   . Emotionally Abused:   Marland Kitchen Physically Abused:   . Sexually Abused:     Outpatient Medications Prior to Visit  Medication Sig Dispense Refill  . acetaminophen (TYLENOL) 325 MG tablet Take 650 mg by mouth every 6 (six) hours as needed.      Marland Kitchen albuterol (VENTOLIN HFA) 108 (90 Base) MCG/ACT inhaler INHALE 2 PUFFS INTO THE LUNGS EVERY 6 HOURS AS NEEDED FOR WHEEZING OR SHORTNESS OF BREATH (Patient taking differently: Inhale 2 puffs into the lungs every 6 (six) hours as needed for wheezing or shortness of breath. ) 25.5 g 1  . Ascorbic Acid (VITAMIN C) 100 MG tablet Take 100 mg by mouth daily.    . budesonide (PULMICORT) 180 MCG/ACT inhaler Inhale 1-2 puffs into the lungs 2 (two) times daily. 1 each 3  . Cholecalciferol (CVS VIT D 5000 HIGH-POTENCY PO) Take 5,000 Units by mouth.    . fluticasone (FLONASE) 50 MCG/ACT nasal spray Place 2 sprays into both nostrils daily. 48 g 1  . loratadine (CLARITIN) 10 MG tablet Take 10 mg by mouth in the morning and at bedtime.     . montelukast (SINGULAIR) 10 MG tablet Take 1 tablet (10 mg total) by mouth daily as needed. 90 tablet 3  . Omega-3 1000 MG CAPS Take 1,000 mg by mouth daily.    Marland Kitchen SYNTHROID 150 MCG tablet TAKE 1 TABLET(150 MCG) BY MOUTH DAILY BEFORE BREAKFAST (Patient taking differently: Take 150 mcg by mouth daily before breakfast. ) 90 tablet 1  . triamterene-hydrochlorothiazide (MAXZIDE) 75-50 MG tablet TAKE 1 TABLET BY MOUTH DAILY 90 tablet 1  . budesonide (PULMICORT) 0.25 MG/2ML nebulizer solution Take 2 mLs (0.25 mg total) by nebulization 2 (two) times daily. 60 mL 12  . ipratropium-albuterol (DUONEB) 0.5-2.5 (3) MG/3ML SOLN Take 3 mLs by nebulization 2 (two) times daily. 360 mL 1   No facility-administered medications prior to visit.    Allergies  Allergen Reactions  . Levaquin [Levofloxacin] Shortness Of Breath  . Clindamycin/Lincomycin     Pruritus No rash  . Gabapentin Itching  . Penicillins     Positive on allergy testing 'years ago.' Has never had penicillin. Has tolerated amoxicillin.  . Codeine Itching    Review of Systems  Constitutional: Positive for malaise/fatigue. Negative for fever.  HENT: Positive for congestion.   Eyes: Negative for blurred vision.   Respiratory: Positive for cough. Negative for shortness of breath.   Cardiovascular: Negative for chest pain, palpitations and leg swelling.  Gastrointestinal: Negative for abdominal pain, blood in stool and nausea.  Genitourinary: Negative for dysuria and frequency.  Musculoskeletal: Positive for myalgias and neck pain. Negative  for falls.  Skin: Negative for rash.  Neurological: Negative for dizziness, loss of consciousness and headaches.  Endo/Heme/Allergies: Negative for environmental allergies.  Psychiatric/Behavioral: Negative for depression. The patient is not nervous/anxious.        Objective:    Physical Exam Vitals and nursing note reviewed.  Constitutional:      General: She is not in acute distress.    Appearance: She is well-developed.  HENT:     Head: Normocephalic and atraumatic.     Nose: Nose normal.  Eyes:     General:        Right eye: No discharge.        Left eye: No discharge.  Cardiovascular:     Rate and Rhythm: Normal rate and regular rhythm.     Heart sounds: No murmur heard.   Pulmonary:     Effort: Pulmonary effort is normal.     Breath sounds: Normal breath sounds.  Abdominal:     General: Bowel sounds are normal.     Palpations: Abdomen is soft.     Tenderness: There is no abdominal tenderness.  Musculoskeletal:     Cervical back: Normal range of motion and neck supple.     Comments: Spasm over right SCM muscle  Skin:    General: Skin is warm and dry.  Neurological:     Mental Status: She is alert and oriented to person, place, and time.     BP 120/70 (BP Location: Left Arm, Cuff Size: Large)   Pulse 73   Temp 98.5 F (36.9 C) (Oral)   Resp 12   Ht _0  (1.549 m)   Wt 207 lb 6.4 oz (94.1 kg)   SpO2 98%   BMI 39.19 kg/m  Wt Readings from Last 3 Encounters:  02/06/20 207 lb 6.4 oz (94.1 kg)  01/23/20 205 lb (93 kg)  01/14/20 201 lb 8 oz (91.4 kg)    Diabetic Foot Exam - Simple   No data filed     Lab Results  Component  Value Date   WBC 8.7 02/06/2020   HGB 13.0 02/06/2020   HCT 39.0 02/06/2020   PLT 299.0 02/06/2020   GLUCOSE 93 02/06/2020   CHOL 219 (H) 02/06/2020   TRIG 208.0 (H) 02/06/2020   HDL 50.80 02/06/2020   LDLDIRECT 132.0 02/06/2020   LDLCALC 119 (H) 06/05/2018   ALT 17 02/06/2020   AST 17 02/06/2020   NA 138 02/06/2020   K 3.9 02/06/2020   CL 98 02/06/2020   CREATININE 0.80 02/06/2020   BUN 13 02/06/2020   CO2 34 (H) 02/06/2020   TSH 0.47 02/06/2020   HGBA1C 6.6 (H) 02/06/2020    Lab Results  Component Value Date   TSH 0.47 02/06/2020   Lab Results  Component Value Date   WBC 8.7 02/06/2020   HGB 13.0 02/06/2020   HCT 39.0 02/06/2020   MCV 91.5 02/06/2020   PLT 299.0 02/06/2020   Lab Results  Component Value Date   NA 138 02/06/2020   K 3.9 02/06/2020   CO2 34 (H) 02/06/2020   GLUCOSE 93 02/06/2020   BUN 13 02/06/2020   CREATININE 0.80 02/06/2020   BILITOT 0.2 02/06/2020   ALKPHOS 74 02/06/2020   AST 17 02/06/2020   ALT 17 02/06/2020   PROT 6.7 02/06/2020   ALBUMIN 3.9 02/06/2020   CALCIUM 10.0 02/06/2020   ANIONGAP 9 01/16/2020   GFR 85.57 02/06/2020   Lab Results  Component Value Date   CHOL 219 (  H) 02/06/2020   Lab Results  Component Value Date   HDL 50.80 02/06/2020   Lab Results  Component Value Date   LDLCALC 119 (H) 06/05/2018   Lab Results  Component Value Date   TRIG 208.0 (H) 02/06/2020   Lab Results  Component Value Date   CHOLHDL 4 02/06/2020   Lab Results  Component Value Date   HGBA1C 6.6 (H) 02/06/2020       Assessment & Plan:   Problem List Items Addressed This Visit    Hyperlipidemia   Relevant Orders   Lipid panel (Completed)   LDL cholesterol, direct (Completed)   HTN (hypertension)    Well controlled, no changes to meds. Encouraged heart healthy diet such as the DASH diet and exercise as tolerated.       Relevant Orders   CBC (Completed)   Comprehensive metabolic panel (Completed)   TSH (Completed)    Hypothyroidism    On Levothyroxine, continue to monitor      Allergic rhinitis - Primary    Continue singulair and claritin and restart Flonase and nasal saline      Relevant Orders   Ambulatory referral to Allergy   Preventative health care    Patient encouraged to maintain heart healthy diet, regular exercise, adequate sleep. Consider daily probiotics. Take medications as prescribed. Labs ordered and reviewed.MGM was August 2020, Dexa scan August 2020. Colonoscopy 2016 showed no polyps.       Controlled type 2 diabetes mellitus without complication, without long-term current use of insulin (HCC)    hgba1c acceptable, minimize simple carbs. Increase exercise as tolerated. Continue current meds. Given rx for glucometer, lancets and test strips and advised to check weekly and prm      Relevant Orders   Hemoglobin A1c (Completed)   Acute bronchitis    Symptomatically better since recent hospitalization. No changes to therapy at this time.       Pain, neck    Most notably on right side with radicular symptoms down right arm. Degenerative changes in spine noted on xray will proceed with MRI and referral for pain. Given Tizanidine to use prn 1-4 mg prn      Relevant Orders   MR Cervical Spine Wo Contrast    Other Visit Diagnoses    Sinusitis, unspecified chronicity, unspecified location       Relevant Orders   Ambulatory referral to Allergy   Radiculopathy of arm       Relevant Medications   tiZANidine (ZANAFLEX) 2 MG tablet   Other Relevant Orders   MR Cervical Spine Wo Contrast   Muscle spasm       Relevant Orders   Magnesium (Completed)      I have discontinued Zoraya Vallely's ipratropium-albuterol. I am also having her start on tiZANidine, OneTouch Delica Plus [id number], OneTouch Verio [employer], and [employer]. Additionally, I am having her maintain her loratadine, Cholecalciferol (CVS VIT D 5000 HIGH-POTENCY PO), acetaminophen, albuterol, montelukast,  triamterene-hydrochlorothiazide, Synthroid, Omega-3, vitamin C, fluticasone, and budesonide.  Meds ordered this encounter  Medications  . tiZANidine (ZANAFLEX) 2 MG tablet    Sig: Take 0.5-2 tablets (1-4 mg total) by mouth 2 (two) times daily as needed for muscle spasms.    Dispense:  30 tablet    Refill:  1  . Lancets (ONETOUCH DELICA PLUS [id number]) MISC    Sig: Use to check blood glucose once a day.  DX code: E11.9    Dispense:  100 each    Refill:  1  . Blood Glucose Monitoring Suppl ([place]) w/Device KIT    Sig: Use to check blood glucose once a day.  DX code: E11.9    Dispense:  1 kit    Refill:  0  . glucose blood (ONETOUCH VERIO) test strip    Sig: Use to check blood glucose once a day.  DX code: E11.9    Dispense:  100 each    Refill:  1     [name], MD

## 2020-02-06 NOTE — Assessment & Plan Note (Signed)
On Levothyroxine, continue to monitor 

## 2020-02-06 NOTE — Progress Notes (Signed)
0

## 2020-02-06 NOTE — Assessment & Plan Note (Signed)
Symptomatically better since recent hospitalization. No changes to therapy at this time.

## 2020-02-06 NOTE — Assessment & Plan Note (Signed)
Continue singulair and claritin and restart Flonase and nasal saline

## 2020-02-06 NOTE — Assessment & Plan Note (Signed)
Most notably on right side with radicular symptoms down right arm. Degenerative changes in spine noted on xray will proceed with MRI and referral for pain. Given Tizanidine to use prn 1-4 mg prn

## 2020-02-06 NOTE — Assessment & Plan Note (Signed)
hgba1c acceptable, minimize simple carbs. Increase exercise as tolerated. Continue current meds. Given rx for glucometer, lancets and test strips and advised to check weekly and prm

## 2020-02-06 NOTE — Assessment & Plan Note (Signed)
Well controlled, no changes to meds. Encouraged heart healthy diet such as the DASH diet and exercise as tolerated.  °

## 2020-02-06 NOTE — Addendum Note (Signed)
Addended by: [name] A on: 02/06/2020 10:38 PM   Modules accepted: Level of Service

## 2020-02-07 MED ORDER — ROSUVASTATIN CALCIUM 5 MG PO TABS
5.0000 mg | ORAL_TABLET | ORAL | 2 refills | Status: DC
Start: 2020-02-10 — End: 2020-05-18

## 2020-02-07 NOTE — Addendum Note (Signed)
Addended by: Wynonia Musty A on: 02/07/2020 11:38 AM   Modules accepted: Orders

## 2020-02-10 ENCOUNTER — Telehealth: Payer: Self-pay | Admitting: *Deleted

## 2020-02-10 ENCOUNTER — Other Ambulatory Visit: Payer: Self-pay | Admitting: Family Medicine

## 2020-02-10 MED ORDER — ALPRAZOLAM 0.25 MG PO TABS: 0.2500 mg | ORAL_TABLET | Freq: Once | ORAL | 0 refills | Status: DC | PRN

## 2020-02-10 NOTE — Telephone Encounter (Signed)
-----   Message from [name], MD sent at 02/10/2020  1:15 PM EDT ----- Regarding: RE: mri cervical I have sent in Alprazolam 0.25 mg to take as needed. Sent in 4 tabs, can try one ahead of time to see how she does. She can take one 2 hours prior then repeat at time of MRI as needed. Up to 3doses as needed. Please let her know ----- Message ----- From: Katha Hamming Sent: 02/10/2020  12:45 PM EDT To: [name], MD Subject: mri cervical                                   [place] requests some claustrophobia meds to be called in for her MRI on 02/22/20 at 11 am.  Thanks, Hoyle Sauer

## 2020-02-10 NOTE — Telephone Encounter (Signed)
Patient notified

## 2020-02-14 ENCOUNTER — Telehealth: Payer: Self-pay | Admitting: Family Medicine

## 2020-02-14 NOTE — Telephone Encounter (Signed)
Patient notified that form has been faxed back

## 2020-02-14 NOTE — Telephone Encounter (Signed)
New message:   Pt is calling and states the pharmacy has sent [place] some paperwork in reference to her glucose meter that a prescription was sent in for. She states they are waiting on the return paperwork before she can get the meter. Please advise.

## 2020-02-22 ENCOUNTER — Other Ambulatory Visit: Payer: Self-pay

## 2020-02-22 ENCOUNTER — Ambulatory Visit (HOSPITAL_BASED_OUTPATIENT_CLINIC_OR_DEPARTMENT_OTHER)
Admission: RE | Admit: 2020-02-22 | Discharge: 2020-02-22 | Disposition: A | Discharge status: Home / Self Care | Payer: Medicare Other | Source: Ambulatory Visit | Attending: Family Medicine | Admitting: Family Medicine

## 2020-02-22 DIAGNOSIS — M50221 Other cervical disc displacement at C4-C5 level: Secondary | ICD-10-CM | POA: Diagnosis not present

## 2020-02-22 DIAGNOSIS — M5410 Radiculopathy, site unspecified: Secondary | ICD-10-CM | POA: Diagnosis not present

## 2020-02-22 DIAGNOSIS — M5021 Other cervical disc displacement,  high cervical region: Secondary | ICD-10-CM | POA: Diagnosis not present

## 2020-02-22 DIAGNOSIS — M542 Cervicalgia: Secondary | ICD-10-CM | POA: Insufficient documentation

## 2020-02-22 DIAGNOSIS — M50222 Other cervical disc displacement at C5-C6 level: Secondary | ICD-10-CM | POA: Diagnosis not present

## 2020-02-22 DIAGNOSIS — M4802 Spinal stenosis, cervical region: Secondary | ICD-10-CM | POA: Diagnosis not present

## 2020-03-17 ENCOUNTER — Ambulatory Visit: Payer: Self-pay | Admitting: Allergy and Immunology

## 2020-04-08 ENCOUNTER — Ambulatory Visit (INDEPENDENT_AMBULATORY_CARE_PROVIDER_SITE_OTHER): Payer: Medicare Other | Admitting: Allergy and Immunology

## 2020-04-08 ENCOUNTER — Telehealth: Payer: Self-pay | Admitting: Allergy and Immunology

## 2020-04-08 ENCOUNTER — Other Ambulatory Visit: Payer: Self-pay

## 2020-04-08 ENCOUNTER — Encounter: Payer: Self-pay | Admitting: Allergy and Immunology

## 2020-04-08 VITALS — BP 130/80 | HR 85 | Temp 98.1°F | Resp 20 | Ht 61.81 in | Wt 200.8 lb

## 2020-04-08 DIAGNOSIS — H1013 Acute atopic conjunctivitis, bilateral: Secondary | ICD-10-CM

## 2020-04-08 DIAGNOSIS — H1010 Acute atopic conjunctivitis, unspecified eye: Secondary | ICD-10-CM | POA: Insufficient documentation

## 2020-04-08 DIAGNOSIS — J4540 Moderate persistent asthma, uncomplicated: Secondary | ICD-10-CM

## 2020-04-08 DIAGNOSIS — J3089 Other allergic rhinitis: Secondary | ICD-10-CM

## 2020-04-08 MED ORDER — AZELASTINE HCL 0.1 % NA SOLN: 1.0000 | Freq: Two times a day (BID) | NASAL | 5 refills | Status: DC

## 2020-04-08 MED ORDER — OLOPATADINE HCL 0.2 % OP SOLN: 1.0000 [drp] | Freq: Every day | OPHTHALMIC | 5 refills | Status: DC | PRN

## 2020-04-08 NOTE — Patient Instructions (Addendum)
Allergic rhinitis  Aeroallergen avoidance measures have been discussed and provided in written form.  A prescription has been provided for azelastine/fluticasone nasal spray, 1 spray per nostril twice daily as needed. Proper nasal spray technique has been discussed and demonstrated.  Nasal saline lavage (NeilMed) has been recommended as needed and prior to medicated nasal sprays along with instructions for proper administration.  For thick post nasal drainage, add guaifenesin [id number] mg (Mucinex)  twice daily as needed with adequate hydration as discussed.  Allergic conjunctivitis  Treatment plan as outlined above for allergic rhinitis.  A prescription has been provided for generic Pataday, one drop per eye daily as needed.  If insurance does not cover this medication, medicated allergy eyedrops may be purchased over-the-counter as Restaurant manager, fast food.  I have also recommended eye lubricant drops (i.e., Natural Tears) as needed.  Moderate persistent asthma  A prescription has been provided for Flovent 110 g, 2 inhalations twice daily.  To maximize pulmonary deposition, a spacer has been provided along with instructions for its proper administration with an HFA inhaler.  During upper respiratory tract infections and/or asthma flares, increase Flovent to 3 inhalations via spacer device 3 times daily until symptoms have returned to baseline.  Continue montelukast 10 mg daily at bedtime.  Continue albuterol every 4-6 hours if needed.  Subjective and objective measures of pulmonary function will be followed and the treatment plan will be adjusted accordingly.   Return in about 3 months (around 07/08/2020), or if symptoms worsen or fail to improve.  Control of House Dust Mite Allergen  House dust mites play a major role in allergic asthma and rhinitis.  They occur in environments with high humidity wherever human skin, the food for dust mites is found. High levels have  been detected in dust obtained from mattresses, pillows, carpets, upholstered furniture, bed covers, clothes and soft toys.  The principal allergen of the house dust mite is found in its feces.  A gram of dust may contain 1,000 mites and 250,000 fecal particles.  Mite antigen is easily measured in the air during house cleaning activities.    1. Encase mattresses, including the box spring, and pillow, in an air tight cover.  Seal the zipper end of the encased mattresses with wide adhesive tape. 2. Wash the bedding in water of 130 degrees Farenheit weekly.  Avoid cotton comforters/quilts and flannel bedding: the most ideal bed covering is the dacron comforter. 3. Remove all upholstered furniture from the bedroom. 4. Remove carpets, carpet padding, rugs, and non-washable window drapes from the bedroom.  Wash drapes weekly or use plastic window coverings. 5. Remove all non-washable stuffed toys from the bedroom.  Wash stuffed toys weekly. 6. Have the room cleaned frequently with a vacuum cleaner and a damp dust-mop.  The patient should not be in a room which is being cleaned and should wait 1 hour after cleaning before going into the room. 7. Close and seal all heating outlets in the bedroom.  Otherwise, the room will become filled with dust-laden air.  An electric heater can be used to heat the room. 8. Reduce indoor humidity to less than 50%.  Do not use a humidifier.  Control of Mold Allergen  Mold and fungi can grow on a variety of surfaces provided certain temperature and moisture conditions exist.  Outdoor molds grow on plants, decaying vegetation and soil.  The major outdoor mold, Alternaria and Cladosporium, are found in very high numbers during hot and dry conditions.  Generally, a late Summer - Fall peak is seen for common outdoor fungal spores.  Rain will temporarily lower outdoor mold spore count, but counts rise rapidly when the rainy period ends.  The most important indoor molds are  Aspergillus and Penicillium.  Dark, humid and poorly ventilated basements are ideal sites for mold growth.  The next most common sites of mold growth are the bathroom and the kitchen.  Outdoor [employer] 1. Use air conditioning and keep windows closed 2. Avoid exposure to decaying vegetation. 3. Avoid leaf raking. 4. Avoid grain handling. 5. Consider wearing a face mask if working in moldy areas.  Indoor Mold Control 1. Maintain humidity below 50%. 2. Clean washable surfaces with 5% bleach solution. 3. Remove sources e.g. Contaminated carpets.  Reducing Pollen Exposure    1. Use nasal saline spray (i.e., Simply Saline) as needed. 2. Use eye lubricant drops (i.e., Natural Tears) as needed. 3. Do not hang sheets or clothing out to dry; pollen may collect on these items. 4. Do not mow lawns or spend time around freshly cut grass; mowing stirs up pollen. 5. Keep windows closed at night.  Keep car windows closed while driving. 6. Minimize morning activities outdoors, a time when pollen counts are usually at their highest. 7. Stay indoors as much as possible when pollen counts or humidity is high and on windy days when pollen tends to remain in the air longer. 8. Use air conditioning when possible.  Many air conditioners have filters that trap the pollen spores. 9. Use a HEPA room air filter to remove pollen form the indoor air you breathe.

## 2020-04-08 NOTE — Assessment & Plan Note (Signed)
   Aeroallergen avoidance measures have been discussed and provided in written form.  A prescription has been provided for azelastine/fluticasone nasal spray, 1 spray per nostril twice daily as needed. Proper nasal spray technique has been discussed and demonstrated.  Nasal saline lavage (NeilMed) has been recommended as needed and prior to medicated nasal sprays along with instructions for proper administration.  For thick post nasal drainage, add guaifenesin [phone] mg (Mucinex)  twice daily as needed with adequate hydration as discussed.

## 2020-04-08 NOTE — Progress Notes (Signed)
New Patient Note  RE: Monica Wallace MRN: [id number] DOB: 02/25/1951 Date of Office Visit: 04/08/2020  Referring provider: [name], MD Primary care provider: [name], MD  Chief Complaint: Wheezing and Allergic Rhinitis    History of present illness: Monica Wallace is a 69 y.o. female seen today in consultation requested by [name], MD.  She has a history of recurrent bronchitis/asthma and was hospitalized in late June 2021 for bronchitis/asthma exacerbation which was brought on by an upper respiratory tract infection. When she was discharged, she was started on Pulmicort 180 g Flexhaler, 2 inhalations twice daily, and montelukast 10 mg daily at bedtime.  While on this regimen she has rarely required albuterol rescue and has not been experiencing limitations in normal daily activities or nocturnal awakenings due to lower respiratory symptoms. [place] experiences nasal congestion, rhinorrhea, sneezing, postnasal drainage, sinus pressure, ear pressure, and ocular pruritus.  The symptoms occur year-round but are more frequent and severe during the springtime.  She attempts to control the symptoms with fluticasone nasal spray as well as essential oils.  Assessment and plan: Allergic rhinitis  Aeroallergen avoidance measures have been discussed and provided in written form.  A prescription has been provided for azelastine/fluticasone nasal spray, 1 spray per nostril twice daily as needed. Proper nasal spray technique has been discussed and demonstrated.  Nasal saline lavage (NeilMed) has been recommended as needed and prior to medicated nasal sprays along with instructions for proper administration.  For thick post nasal drainage, add guaifenesin [phone] mg (Mucinex)  twice daily as needed with adequate hydration as discussed.  Allergic conjunctivitis  Treatment plan as outlined above for allergic rhinitis.  A prescription has been provided for generic Pataday, one drop  per eye daily as needed.  If insurance does not cover this medication, medicated allergy eyedrops may be purchased over-the-counter as Restaurant manager, fast food.  I have also recommended eye lubricant drops (i.e., Natural Tears) as needed.  Moderate persistent asthma  A prescription has been provided for Flovent 110 g, 2 inhalations twice daily.  To maximize pulmonary deposition, a spacer has been provided along with instructions for its proper administration with an HFA inhaler.  During upper respiratory tract infections and/or asthma flares, increase Flovent to 3 inhalations via spacer device 3 times daily until symptoms have returned to baseline.  Continue montelukast 10 mg daily at bedtime.  Continue albuterol every 4-6 hours if needed.  Subjective and objective measures of pulmonary function will be followed and the treatment plan will be adjusted accordingly.   Meds ordered this encounter  Medications  . Olopatadine HCl 0.2 % SOLN    Sig: Apply 1 drop to eye daily as needed.    Dispense:  2.5 mL    Refill:  5  . azelastine (ASTELIN) 0.1 % nasal spray    Sig: Place 1 spray into both nostrils 2 (two) times daily. Use in each nostril as directed    Dispense:  30 mL    Refill:  5    Diagnostics: Spirometry: Spirometry reveals an FVC of 2.11 L and an FEV1 of 1.76 L (109% predicted) with 160 mL (9%) postbronchodilator improvement.  This study was performed while the patient was asymptomatic.  Please see scanned spirometry results for details. Epicutaneous testing: Negative despite a positive histamine control. Intradermal testing: Mild reactivity to grass pollen, tree pollen, major mold mix #4, and dust mite antigen mix.   Physical examination: Blood pressure 130/80, pulse 85, temperature 98.1  F (36.7 C), temperature source Oral, resp. rate 20, height 5' 1.81" (1.57 m), weight 200 lb 13.4 oz (91.1 kg), SpO2 100 %.  General: Alert, interactive, in no acute  distress. HEENT: TMs pearly gray, turbinates moderately edematous without discharge, post-pharynx moderately erythematous. Neck: Supple without lymphadenopathy. Lungs: Clear to auscultation without wheezing, rhonchi or rales. CV: Normal S1, S2 without murmurs. Abdomen: Nondistended, nontender. Skin: Warm and dry, without lesions or rashes. Extremities:  No clubbing, cyanosis or edema. Neuro:   Grossly intact.  Review of systems:  Review of systems negative except as noted in HPI / PMHx or noted below: Review of Systems  Constitutional: Negative.   HENT: Negative.   Eyes: Negative.   Respiratory: Negative.   Cardiovascular: Negative.   Gastrointestinal: Negative.   Genitourinary: Negative.   Musculoskeletal: Negative.   Skin: Negative.   Neurological: Negative.   Endo/Heme/Allergies: Negative.   Psychiatric/Behavioral: Negative.     Past medical history:  Past Medical History:  Diagnosis Date  . Arthritis   . Asthma   . Baker's cyst of knee, right 02/16/2017  . Cancer ([place])    thyroid (2002)  . Cystic breast   . GERD (gastroesophageal reflux disease)   . Hepatic cyst 02/02/2015  . Hyperlipidemia   . Hypertension   . Insomnia 07/23/2016  . Menopause   . Migraine   . Palpitations   . Preventative health care 01/18/2016  . Sleep apnea 02/02/2015  . Thyroid disease     Past surgical history:  Past Surgical History:  Procedure Laterality Date  . ABDOMINAL HYSTERECTOMY     took right ovary and uterus  . APPENDECTOMY    . CARPAL TUNNEL RELEASE Right   . CESAREAN SECTION    . CHOLECYSTECTOMY    . EYE SURGERY Bilateral 2017   cataracts  . KNEE ARTHROPLASTY    . THYROIDECTOMY  2002    Family history: Family History  Problem Relation Age of Onset  . Arthritis Mother   . Transient ischemic attack Mother   . Hypertension Mother   . Hyperlipidemia Mother   . Diabetes Father   . Heart disease Father   . Arthritis Father   . Kidney disease Father   . Hypertension  Father   . Hyperlipidemia Father   . Cancer Sister 47       pancreatic  . Fibromyalgia Sister   . Allergic rhinitis Sister   . Asthma Maternal Grandfather        smoker  . Depression Maternal Grandfather   . Dementia Paternal Grandmother   . Diabetes Paternal Grandfather   . Hypertension Brother   . Allergic rhinitis Brother   . Scoliosis Daughter        had rods placed and removed  . Arthritis Sister   . Gout Sister   . GER disease Sister   . Cancer Sister        breast cancer  . Breast cancer Sister 53    Social history: Social History   Socioeconomic History  . Marital status: Divorced    Spouse name: Not on file  . Number of children: Not on file  . Years of education: Not on file  . Highest education level: Not on file  Occupational History  . Not on file  Tobacco Use  . Smoking status: Never Smoker  . Smokeless tobacco: Never Used  Vaping Use  . Vaping Use: Never used  Substance and Sexual Activity  . Alcohol use: Yes    Alcohol/week:  0.0 standard drinks    Comment: very rare once or twice a year  . Drug use: No  . Sexual activity: Yes    Birth control/protection: Other-see comments, Surgical    Comment:  boyfriend  Other Topics Concern  . Not on file  Social History Narrative  . Not on file   Social Determinants of Health   Financial Resource Strain: Low Risk   . Difficulty of Paying Living Expenses: Not hard at all  Food Insecurity:   . Worried About Charity fundraiser in the Last Year: Not on file  . Ran Out of Food in the Last Year: Not on file  Transportation Needs:   . Lack of Transportation (Medical): Not on file  . Lack of Transportation (Non-Medical): Not on file  Physical Activity:   . Days of Exercise per Week: Not on file  . Minutes of Exercise per Session: Not on file  Stress:   . Feeling of Stress : Not on file  Social Connections:   . Frequency of Communication with Friends and Family: Not on file  . Frequency of Social  Gatherings with Friends and Family: Not on file  . Attends Religious Services: Not on file  . Active Member of Clubs or Organizations: Not on file  . Attends Archivist Meetings: Not on file  . Marital Status: Not on file  Intimate Partner Violence:   . Fear of Current or Ex-Partner: Not on file  . Emotionally Abused: Not on file  . Physically Abused: Not on file  . Sexually Abused: Not on file    Environmental History: The patient lives in a house with carpeting the bedroom, gassy, and central air.  There is a dog in the home which does not have access to her bedroom.  She is a non-smoker.  Current Outpatient Medications  Medication Sig Dispense Refill  . acetaminophen (TYLENOL) 325 MG tablet Take 650 mg by mouth every 6 (six) hours as needed.    Marland Kitchen albuterol (VENTOLIN HFA) 108 (90 Base) MCG/ACT inhaler INHALE 2 PUFFS INTO THE LUNGS EVERY 6 HOURS AS NEEDED FOR WHEEZING OR SHORTNESS OF BREATH (Patient taking differently: Inhale 2 puffs into the lungs every 6 (six) hours as needed for wheezing or shortness of breath. ) 25.5 g 1  . Ascorbic Acid (VITAMIN C) 100 MG tablet Take 100 mg by mouth as needed.     . Blood Glucose Monitoring Suppl ([facility]) w/Device KIT Use to check blood glucose once a day.  DX code: E11.9 1 kit 0  . budesonide (PULMICORT) 180 MCG/ACT inhaler Inhale 1-2 puffs into the lungs 2 (two) times daily. (Patient taking differently: Inhale 2 puffs into the lungs daily. ) 1 each 3  . Cholecalciferol ([facility] VIT D 5000 HIGH-POTENCY PO) Take 5,000 Units by mouth.    . fluticasone (FLONASE) 50 MCG/ACT nasal spray Place 2 sprays into both nostrils daily. 48 g 1  . glucose blood (ONETOUCH VERIO) test strip Use to check blood glucose once a day.  DX code: E11.9 100 each 1  . Lancets (ONETOUCH DELICA PLUS KJZPHX50V) MISC Use to check blood glucose once a day.  DX code: E11.9 100 each 1  . loratadine (CLARITIN) 10 MG tablet Take 10 mg by mouth in the morning and  at bedtime.     . montelukast (SINGULAIR) 10 MG tablet Take 1 tablet (10 mg total) by mouth daily as needed. 90 tablet 3  . Omega-3 1000 MG CAPS  Take 1,000 mg by mouth daily.    Marland Kitchen omeprazole (PRILOSEC) 20 MG capsule Take 20 mg by mouth daily.    . rosuvastatin (CRESTOR) 5 MG tablet Take 1 tablet (5 mg total) by mouth 2 (two) times a week. Take 1 tablet by mouth on Tuesdays and Sundays weekly. 15 tablet 2  . SYNTHROID 150 MCG tablet TAKE 1 TABLET(150 MCG) BY MOUTH DAILY BEFORE BREAKFAST (Patient taking differently: Take 150 mcg by mouth daily before breakfast. ) 90 tablet 1  . tiZANidine (ZANAFLEX) 2 MG tablet Take 0.5-2 tablets (1-4 mg total) by mouth 2 (two) times daily as needed for muscle spasms. 30 tablet 1  . triamterene-hydrochlorothiazide (MAXZIDE) 75-50 MG tablet TAKE 1 TABLET BY MOUTH DAILY 90 tablet 1  . ALPRAZolam (XANAX) 0.25 MG tablet Take 1-3 tablets (0.25-0.75 mg total) by mouth once as needed for up to 1 dose for anxiety (MRI). (Patient not taking: Reported on 04/08/2020) 4 tablet 0  . azelastine (ASTELIN) 0.1 % nasal spray Place 1 spray into both nostrils 2 (two) times daily. Use in each nostril as directed 30 mL 5  . Olopatadine HCl 0.2 % SOLN Apply 1 drop to eye daily as needed. 2.5 mL 5   No current facility-administered medications for this visit.    Known medication allergies: Allergies  Allergen Reactions  . Levaquin [Levofloxacin] Shortness Of Breath  . Clindamycin/Lincomycin     Pruritus No rash  . Gabapentin Itching  . Penicillins     Positive on allergy testing 'years ago.' Has never had penicillin. Has tolerated amoxicillin.  . Codeine Itching    I appreciate the opportunity to take part in [place] care. Please do not hesitate to contact me with questions.  Sincerely,   R. Edgar Frisk, MD

## 2020-04-08 NOTE — Assessment & Plan Note (Addendum)
   A prescription has been provided for Flovent 110 g, 2 inhalations twice daily.  To maximize pulmonary deposition, a spacer has been provided along with instructions for its proper administration with an HFA inhaler.  During upper respiratory tract infections and/or asthma flares, increase Flovent to 3 inhalations via spacer device 3 times daily until symptoms have returned to baseline.  Continue montelukast 10 mg daily at bedtime.  Continue albuterol every 4-6 hours if needed.  Subjective and objective measures of pulmonary function will be followed and the treatment plan will be adjusted accordingly.

## 2020-04-08 NOTE — Assessment & Plan Note (Signed)
   Treatment plan as outlined above for allergic rhinitis.  A prescription has been provided for generic Pataday, one drop per eye daily as needed.  If insurance does not cover this medication, medicated allergy eyedrops may be purchased over-the-counter as Pataday Extra Strength or Zaditor.  I have also recommended eye lubricant drops (i.e., Natural Tears) as needed. 

## 2020-04-09 MED ORDER — FLOVENT HFA 110 MCG/ACT IN AERO
2.0000 | INHALATION_SPRAY | Freq: Two times a day (BID) | RESPIRATORY_TRACT | 5 refills | Status: DC
Start: 2020-04-09 — End: 2020-07-21

## 2020-04-09 NOTE — Telephone Encounter (Signed)
Pt called stating she was told to stop pulmicort and start flovent but it was not sent to [facility] on [address]

## 2020-04-09 NOTE — Telephone Encounter (Signed)
Flovent has been sent in.

## 2020-04-10 DIAGNOSIS — M1712 Unilateral primary osteoarthritis, left knee: Secondary | ICD-10-CM | POA: Diagnosis not present

## 2020-04-27 ENCOUNTER — Other Ambulatory Visit: Payer: Self-pay | Admitting: Family Medicine

## 2020-04-27 DIAGNOSIS — Z1231 Encounter for screening mammogram for malignant neoplasm of breast: Secondary | ICD-10-CM

## 2020-05-08 ENCOUNTER — Telehealth: Payer: Medicare Other | Admitting: Family Medicine

## 2020-05-14 DIAGNOSIS — M1712 Unilateral primary osteoarthritis, left knee: Secondary | ICD-10-CM | POA: Diagnosis not present

## 2020-05-16 ENCOUNTER — Other Ambulatory Visit: Payer: Self-pay | Admitting: Family Medicine

## 2020-05-20 ENCOUNTER — Other Ambulatory Visit: Payer: Self-pay

## 2020-05-20 ENCOUNTER — Ambulatory Visit
Admission: RE | Admit: 2020-05-20 | Discharge: 2020-05-20 | Disposition: A | Discharge status: Home / Self Care | Payer: Medicare Other | Source: Ambulatory Visit | Attending: Family Medicine | Admitting: Family Medicine

## 2020-05-20 DIAGNOSIS — Z1231 Encounter for screening mammogram for malignant neoplasm of breast: Secondary | ICD-10-CM

## 2020-06-04 DIAGNOSIS — Z800 Family history of malignant neoplasm of digestive organs: Secondary | ICD-10-CM | POA: Diagnosis not present

## 2020-06-04 DIAGNOSIS — R112 Nausea with vomiting, unspecified: Secondary | ICD-10-CM | POA: Diagnosis not present

## 2020-06-04 DIAGNOSIS — R140 Abdominal distension (gaseous): Secondary | ICD-10-CM | POA: Diagnosis not present

## 2020-06-04 DIAGNOSIS — K449 Diaphragmatic hernia without obstruction or gangrene: Secondary | ICD-10-CM | POA: Diagnosis not present

## 2020-06-04 DIAGNOSIS — K5900 Constipation, unspecified: Secondary | ICD-10-CM | POA: Diagnosis not present

## 2020-06-04 DIAGNOSIS — R1033 Periumbilical pain: Secondary | ICD-10-CM | POA: Diagnosis not present

## 2020-06-04 DIAGNOSIS — K219 Gastro-esophageal reflux disease without esophagitis: Secondary | ICD-10-CM | POA: Diagnosis not present

## 2020-06-10 ENCOUNTER — Encounter: Payer: Self-pay | Admitting: Family Medicine

## 2020-06-10 ENCOUNTER — Other Ambulatory Visit: Payer: Self-pay

## 2020-06-10 ENCOUNTER — Ambulatory Visit (INDEPENDENT_AMBULATORY_CARE_PROVIDER_SITE_OTHER): Payer: Medicare Other | Admitting: Family Medicine

## 2020-06-10 VITALS — BP 130/78 | HR 81 | Temp 98.1°F | Ht 61.5 in | Wt 201.0 lb

## 2020-06-10 DIAGNOSIS — M542 Cervicalgia: Secondary | ICD-10-CM

## 2020-06-10 DIAGNOSIS — S46819A Strain of other muscles, fascia and tendons at shoulder and upper arm level, unspecified arm, initial encounter: Secondary | ICD-10-CM | POA: Diagnosis not present

## 2020-06-10 DIAGNOSIS — Z23 Encounter for immunization: Secondary | ICD-10-CM | POA: Diagnosis not present

## 2020-06-10 MED ORDER — MELOXICAM 7.5 MG PO TABS: 7.5000 mg | ORAL_TABLET | Freq: Every day | ORAL | 0 refills | Status: DC

## 2020-06-10 NOTE — Patient Instructions (Signed)
Ice/cold pack over area for 10-15 min twice daily.  Heat (pad or rice pillow in microwave) over affected area, 10-15 minutes twice daily.   OK to take Tylenol 1000 mg (2 extra strength tabs) or 975 mg (3 regular strength tabs) every 6 hours as needed.  No ibuprofen while on this new medication.   Let us know if you need anything.  EXERCISES RANGE OF MOTION (ROM) AND STRETCHING EXERCISES  These exercises may help you when beginning to rehabilitate your issue. In order to successfully resolve your symptoms, you must improve your posture. These exercises are designed to help reduce the forward-head and rounded-shoulder posture which contributes to this condition. Your symptoms may resolve with or without further involvement from your physician, physical therapist or athletic trainer. While completing these exercises, remember:   Restoring tissue flexibility helps normal motion to return to the joints. This allows healthier, less painful movement and activity.  An effective stretch should be held for at least 20 seconds, although you may need to begin with shorter hold times for comfort.  A stretch should never be painful. You should only feel a gentle lengthening or release in the stretched tissue.  Do not do any stretch or exercise that you cannot tolerate.  STRETCH- Axial Extensors  Lie on your back on the floor. You may bend your knees for comfort. Place a rolled-up hand towel or dish towel, about 2 inches in diameter, under the part of your head that makes contact with the floor.  Gently tuck your chin, as if trying to make a "double chin," until you feel a gentle stretch at the base of your head.  Hold 15-20 seconds. Repeat 2-3 times. Complete this exercise 1 time per day.   STRETCH - Axial Extension   Stand or sit on a firm surface. Assume a good posture: chest up, shoulders drawn back, abdominal muscles slightly tense, knees unlocked (if standing) and feet hip width  apart.  Slowly retract your chin so your head slides back and your chin slightly lowers. Continue to look straight ahead.  You should feel a gentle stretch in the back of your head. Be certain not to feel an aggressive stretch since this can cause headaches later.  Hold for 15-20 seconds. Repeat 2-3 times. Complete this exercise 1 time per day.  STRETCH - Cervical Side Bend   Stand or sit on a firm surface. Assume a good posture: chest up, shoulders drawn back, abdominal muscles slightly tense, knees unlocked (if standing) and feet hip width apart.  Without letting your nose or shoulders move, slowly tip your right / left ear to your shoulder until your feel a gentle stretch in the muscles on the opposite side of your neck.  Hold 15-20 seconds. Repeat 2-3 times. Complete this exercise 1-2 times per day.  STRETCH - Cervical Rotators   Stand or sit on a firm surface. Assume a good posture: chest up, shoulders drawn back, abdominal muscles slightly tense, knees unlocked (if standing) and feet hip width apart.  Keeping your eyes level with the ground, slowly turn your head until you feel a gentle stretch along the back and opposite side of your neck.  Hold 15-20 seconds. Repeat 2-3 times. Complete this exercise 1-2 times per day.  RANGE OF MOTION - Neck Circles   Stand or sit on a firm surface. Assume a good posture: chest up, shoulders drawn back, abdominal muscles slightly tense, knees unlocked (if standing) and feet hip width apart.  Gently roll  your head down and around from the back of one shoulder to the back of the other. The motion should never be forced or painful.  Repeat the motion 10-20 times, or until you feel the neck muscles relax and loosen. Repeat 2-3 times. Complete the exercise 1-2 times per day. STRENGTHENING EXERCISES - Cervical Strain and Sprain These exercises may help you when beginning to rehabilitate your injury. They may resolve your symptoms with or without  further involvement from your physician, physical therapist, or athletic trainer. While completing these exercises, remember:   Muscles can gain both the endurance and the strength needed for everyday activities through controlled exercises.  Complete these exercises as instructed by your physician, physical therapist, or athletic trainer. Progress the resistance and repetitions only as guided.  You may experience muscle soreness or fatigue, but the pain or discomfort you are trying to eliminate should never worsen during these exercises. If this pain does worsen, stop and make certain you are following the directions exactly. If the pain is still present after adjustments, discontinue the exercise until you can discuss the trouble with your clinician.  STRENGTH - Cervical Flexors, Isometric  Face a wall, standing about 6 inches away. Place a small pillow, a ball about 6-8 inches in diameter, or a folded towel between your forehead and the wall.  Slightly tuck your chin and gently push your forehead into the soft object. Push only with mild to moderate intensity, building up tension gradually. Keep your jaw and forehead relaxed.  Hold 10 to 20 seconds. Keep your breathing relaxed.  Release the tension slowly. Relax your neck muscles completely before you start the next repetition. Repeat 2-3 times. Complete this exercise 1 time per day.  STRENGTH- Cervical Lateral Flexors, Isometric   Stand about 6 inches away from a wall. Place a small pillow, a ball about 6-8 inches in diameter, or a folded towel between the side of your head and the wall.  Slightly tuck your chin and gently tilt your head into the soft object. Push only with mild to moderate intensity, building up tension gradually. Keep your jaw and forehead relaxed.  Hold 10 to 20 seconds. Keep your breathing relaxed.  Release the tension slowly. Relax your neck muscles completely before you start the next repetition. Repeat 2-3  times. Complete this exercise 1 time per day.  STRENGTH - Cervical Extensors, Isometric   Stand about 6 inches away from a wall. Place a small pillow, a ball about 6-8 inches in diameter, or a folded towel between the back of your head and the wall.  Slightly tuck your chin and gently tilt your head back into the soft object. Push only with mild to moderate intensity, building up tension gradually. Keep your jaw and forehead relaxed.  Hold 10 to 20 seconds. Keep your breathing relaxed.  Release the tension slowly. Relax your neck muscles completely before you start the next repetition. Repeat 2-3 times. Complete this exercise 1 time per day.  POSTURE AND BODY MECHANICS CONSIDERATIONS Keeping correct posture when sitting, standing or completing your activities will reduce the stress put on different body tissues, allowing injured tissues a chance to heal and limiting painful experiences. The following are general guidelines for improved posture. Your physician or physical therapist will provide you with any instructions specific to your needs. While reading these guidelines, remember:  The exercises prescribed by your provider will help you have the flexibility and strength to maintain correct postures.  The correct posture provides the  optimal environment for your joints to work. All of your joints have less wear and tear when properly supported by a spine with good posture. This means you will experience a healthier, less painful body.  Correct posture must be practiced with all of your activities, especially prolonged sitting and standing. Correct posture is as important when doing repetitive low-stress activities (typing) as it is when doing a single heavy-load activity (lifting).  PROLONGED STANDING WHILE SLIGHTLY LEANING FORWARD When completing a task that requires you to lean forward while standing in one place for a long time, place either foot up on a stationary 2- to 4-inch high  object to help maintain the best posture. When both feet are on the ground, the low back tends to lose its slight inward curve. If this curve flattens (or becomes too large), then the back and your other joints will experience too much stress, fatigue more quickly, and can cause pain.   RESTING POSITIONS Consider which positions are most painful for you when choosing a resting position. If you have pain with flexion-based activities (sitting, bending, stooping, squatting), choose a position that allows you to rest in a less flexed posture. You would want to avoid curling into a fetal position on your side. If your pain worsens with extension-based activities (prolonged standing, working overhead), avoid resting in an extended position such as sleeping on your stomach. Most people will find more comfort when they rest with their spine in a more neutral position, neither too rounded nor too arched. Lying on a non-sagging bed on your side with a pillow between your knees, or on your back with a pillow under your knees will often provide some relief. Keep in mind, being in any one position for a prolonged period of time, no matter how correct your posture, can still lead to stiffness.  WALKING Walk with an upright posture. Your ears, shoulders, and hips should all line up. OFFICE WORK When working at a desk, create an environment that supports good, upright posture. Without extra support, muscles fatigue and lead to excessive strain on joints and other tissues.  CHAIR:  A chair should be able to slide under your desk when your back makes contact with the back of the chair. This allows you to work closely.  The chair's height should allow your eyes to be level with the upper part of your monitor and your hands to be slightly lower than your elbows.  Body position: ? Your feet should make contact with the floor. If this is not possible, use a foot rest. ? Keep your ears over your shoulders. This will  reduce stress on your neck and low back.   Trapezius stretches/exercises Do exercises exactly as told by your health care provider and adjust them as directed. It is normal to feel mild stretching, pulling, tightness, or discomfort as you do these exercises, but you should stop right away if you feel sudden pain or your pain gets worse.  Stretching and range of motion exercises These exercises warm up your muscles and joints and improve the movement and flexibility of your shoulder. These exercises can also help to relieve pain, numbness, and tingling. If you are unable to do any of the following for any reason, do not further attempt to do it.   Exercise A: Flexion, standing    1. Stand and hold a broomstick, a cane, or a similar object. Place your hands a little more than shoulder-width apart on the object. Your left /  right hand should be palm-up, and your other hand should be palm-down. 2. Push the stick to raise your left / right arm out to your side and then over your head. Use your other hand to help move the stick. Stop when you feel a stretch in your shoulder, or when you reach the angle that is recommended by your health care provider. ? Avoid shrugging your shoulder while you raise your arm. Keep your shoulder blade tucked down toward your spine. 3. Hold for 30 seconds. 4. Slowly return to the starting position. Repeat 2 times. Complete this exercise 3 times per week.  Exercise B: Abduction, supine    1. Lie on your back and hold a broomstick, a cane, or a similar object. Place your hands a little more than shoulder-width apart on the object. Your left / right hand should be palm-up, and your other hand should be palm-down. 2. Push the stick to raise your left / right arm out to your side and then over your head. Use your other hand to help move the stick. Stop when you feel a stretch in your shoulder, or when you reach the angle that is recommended by your health care  provider. ? Avoid shrugging your shoulder while you raise your arm. Keep your shoulder blade tucked down toward your spine. 3. Hold for 30 seconds. 4. Slowly return to the starting position. Repeat 2 times. Complete this exercise 3 times per week.  Exercise C: Flexion, active-assisted    1. Lie on your back. You may bend your knees for comfort. 2. Hold a broomstick, a cane, or a similar object. Place your hands about shoulder-width apart on the object. Your palms should face toward your feet. 3. Raise the stick and move your arms over your head and behind your head, toward the floor. Use your healthy arm to help your left / right arm move farther. Stop when you feel a gentle stretch in your shoulder, or when you reach the angle where your health care provider tells you to stop. 4. Hold for 30 seconds. 5. Slowly return to the starting position. Repeat 2 times. Complete this exercise 3 times per week.  Exercise D: External rotation and abduction    1. Stand in a door frame with one of your feet slightly in front of the other. This is called a staggered stance. 2. Choose one of the following positions as told by your health care provider: ? Place your hands and forearms on the door frame above your head. ? Place your hands and forearms on the door frame at the height of your head. ? Place your hands on the door frame at the height of your elbows. 3. Slowly move your weight onto your front foot until you feel a stretch across your chest and in the front of your shoulders. Keep your head and chest upright and keep your abdominal muscles tight. 4. Hold for 30 seconds. 5. To release the stretch, shift your weight to your back foot. Repeat 2 times. Complete this stretch 3 times per week.  Strengthening exercises These exercises build strength and endurance in your shoulder. Endurance is the ability to use your muscles for a long time, even after your muscles get tired. Exercise E: Scapular  depression and adduction  1. Sit on a stable chair. Support your arms in front of you with pillows, armrests, or a tabletop. Keep your elbows in line with the sides of your body. 2. Gently move your shoulder blades  down toward your middle back. Relax the muscles on the tops of your shoulders and in the back of your neck. 3. Hold for 3 seconds. 4. Slowly release the tension and relax your muscles completely before doing this exercise again. Repeat for a total of 10 repetitions. 5. After you have practiced this exercise, try doing the exercise without the arm support. Then, try the exercise while standing instead of sitting. Repeat 2 times. Complete this exercise 3 times per week.  Exercise F: Shoulder abduction, isometric    1. Stand or sit about 4-6 inches (10-15 cm) from a wall with your left / right side facing the wall. 2. Bend your left / right elbow and gently press your elbow against the wall. 3. Increase the pressure slowly until you are pressing as hard as you can without shrugging your shoulder. 4. Hold for 3 seconds. 5. Slowly release the tension and relax your muscles completely. Repeat for a total of 10 repetitions. Repeat 2 times. Complete this exercise 3 times per week.  Exercise G: Shoulder flexion, isometric    1. Stand or sit about 4-6 inches (10-15 cm) away from a wall with your left / right side facing the wall. 2. Keep your left / right elbow straight and gently press the top of your fist against the wall. Increase the pressure slowly until you are pressing as hard as you can without shrugging your shoulder. 3. Hold for 10-15 seconds. 4. Slowly release the tension and relax your muscles completely. Repeat for a total of 10 repetitions. Repeat 2 times. Complete this exercise 3 times per week.  Exercise H: Internal rotation    1. Sit in a stable chair without armrests, or stand. Secure an exercise band at your left / right side, at elbow height. 2. Place a soft  object, such as a folded towel or a small pillow, under your left / right upper arm so your elbow is a few inches (about 8 cm) away from your side. 3. Hold the end of the exercise band so the band stretches. 4. Keeping your elbow pressed against the soft object under your arm, move your forearm across your body toward your abdomen. Keep your body steady so the movement is only coming from your shoulder. 5. Hold for 3 seconds. 6. Slowly return to the starting position. Repeat for a total of 10 repetitions. Repeat 2 times. Complete this exercise 3 times per week.  Exercise I: External rotation    1. Sit in a stable chair without armrests, or stand. 2. Secure an exercise band at your left / right side, at elbow height. 3. Place a soft object, such as a folded towel or a small pillow, under your left / right upper arm so your elbow is a few inches (about 8 cm) away from your side. 4. Hold the end of the exercise band so the band stretches. 5. Keeping your elbow pressed against the soft object under your arm, move your forearm out, away from your abdomen. Keep your body steady so the movement is only coming from your shoulder. 6. Hold for 3 seconds. 7. Slowly return to the starting position. Repeat for a total of 10 repetitions. Repeat 2 times. Complete this exercise 3 times per week. Exercise J: Shoulder extension  1. Sit in a stable chair without armrests, or stand. Secure an exercise band to a stable object in front of you so the band is at shoulder height. 2. Hold one end of the exercise  band in each hand. Your palms should face each other. 3. Straighten your elbows and lift your hands up to shoulder height. 4. Step back, away from the secured end of the exercise band, until the band stretches. 5. Squeeze your shoulder blades together and pull your hands down to the sides of your thighs. Stop when your hands are straight down by your sides. Do not let your hands go behind your body. 6. Hold  for 3 seconds. 7. Slowly return to the starting position. Repeat for a total of 10 repetitions. Repeat 2 times. Complete this exercise 3 times per week.  Exercise K: Shoulder extension, prone    1. Lie on your abdomen on a firm surface so your left / right arm hangs over the edge. 2. Hold a 5 lb weight in your hand so your palm faces in toward your body. Your arm should be straight. 3. Squeeze your shoulder blade down toward the middle of your back. 4. Slowly raise your arm behind you, up to the height of the surface that you are lying on. Keep your arm straight. 5. Hold for 3 seconds. 6. Slowly return to the starting position and relax your muscles. Repeat for a total of 10 repetitions. Repeat 2 times. Complete this exercise 3 times per week.   Exercise L: Horizontal abduction, prone  1. Lie on your abdomen on a firm surface so your left / right arm hangs over the edge. 2. Hold a 5 lb weight in your hand so your palm faces toward your feet. Your arm should be straight. 3. Squeeze your shoulder blade down toward the middle of your back. 4. Bend your elbow so your hand moves up, until your elbow is bent to an "L" shape (90 degrees). With your elbow bent, slowly move your forearm forward and up. Raise your hand up to the height of the surface that you are lying on. ? Your upper arm should not move, and your elbow should stay bent. ? At the top of the movement, your palm should face the floor. 5. Hold for 3 seconds. 6. Slowly return to the starting position and relax your muscles. Repeat for a total of 10 repetitions. Repeat 2 times. Complete this exercise 3 times per week.  Exercise M: Horizontal abduction, standing  1. Sit on a stable chair, or stand. 2. Secure an exercise band to a stable object in front of you so the band is at shoulder height. 3. Hold one end of the exercise band in each hand. 4. Straighten your elbows and lift your hands straight in front of you, up to shoulder  height. Your palms should face down, toward the floor. 5. Step back, away from the secured end of the exercise band, until the band stretches. 6. Move your arms out to your sides, and keep your arms straight. 7. Hold for 3 seconds. 8. Slowly return to the starting position. Repeat for a total of 10 repetitions. Repeat 2 times. Complete this exercise 3 times per week.  Exercise N: Scapular retraction and elevation  1. Sit on a stable chair, or stand. 2. Secure an exercise band to a stable object in front of you so the band is at shoulder height. 3. Hold one end of the exercise band in each hand. Your palms should face each other. 4. Sit in a stable chair without armrests, or stand. 5. Step back, away from the secured end of the exercise band, until the band stretches. 6. Squeeze your shoulder  blades together and lift your hands over your head. Keep your elbows straight. 7. Hold for 3 seconds. 8. Slowly return to the starting position. Repeat for a total of 10 repetitions. Repeat 2 times. Complete this exercise 3 times per week.  This information is not intended to replace advice given to you by your health care provider. Make sure you discuss any questions you have with your health care provider. Document Released: 07/04/2005 Document Revised: 03/10/2016 Document Reviewed: 05/21/2015 Elsevier Interactive Patient Education  2017 [employer].

## 2020-06-10 NOTE — Progress Notes (Signed)
Musculoskeletal Exam  Patient: Monica Wallace DOB: 11/02/1950  DOS: 06/10/2020  SUBJECTIVE:  Chief Complaint:   Chief Complaint  Patient presents with  . Neck Pain    Monica Wallace is a 69 y.o.  female for evaluation and treatment of R sided neck pain.   Onset:  7 months ago. No inj or change in activity.  Location: Behind R ear and base of R skull Character:  aching  Progression of issue:  has worsened over past 2-3 weeks Associated symptoms: no swelling, skin changes that she is aware of; ROM nml, but hurts towards ends of it Treatment: to date has been OTC NSAIDS and acetaminophen.   Neurovascular symptoms: no  Past Medical History:  Diagnosis Date  . Arthritis   . Asthma   . Baker's cyst of knee, right 02/16/2017  . Cancer ([place])    thyroid (2002)  . Cystic breast   . GERD (gastroesophageal reflux disease)   . Hepatic cyst 02/02/2015  . Hyperlipidemia   . Hypertension   . Insomnia 07/23/2016  . Menopause   . Migraine   . Palpitations   . Preventative health care 01/18/2016  . Sleep apnea 02/02/2015  . Thyroid disease     Objective: VITAL SIGNS: BP 130/78 (BP Location: Left Arm, Patient Position: Sitting, Cuff Size: Normal)   Pulse 81   Temp 98.1 F (36.7 C) (Oral)   Ht 5' 1.5" (1.562 m)   Wt 201 lb (91.2 kg)   SpO2 96%   BMI 37.36 kg/m  Constitutional: Well formed, well developed. No acute distress. Ears: Patent, no ttp, TM's neg Thorax & Lungs: No accessory muscle use Musculoskeletal: neck.   Normal active range of motion: no, decreased ritation, worse turning to L.   Normal passive range of motion: no, as above Tenderness to palpation: yes over subocc triangle on R, R sided neck msc, no SCM or midline ttp Deformity: no Ecchymosis: no Tests positive: none Tests negative: Spurling's Neurologic: Normal sensory function. No focal deficits noted. DTR's equal and symmetric in UE's. No clonus. Strength equal and symmetric throughout.  Psychiatric: Normal mood. Age  appropriate judgment and insight. Alert & oriented x 3.    Assessment:  Neck pain - Plan: meloxicam (MOBIC) 7.5 MG tablet  Strain of trapezius muscle, unspecified laterality, initial encounter - Plan: meloxicam (MOBIC) 7.5 MG tablet  Plan: 1. Status: Chronic, worsening. Stretches/exercises, heat, ice, Tylenol. Low dose Mobic as she does not tolerate NSAIDs. Ins not covering celecoxib.   2. New. Stretches/exercises for trap. Tx as above.  F/u as originally scheduled w reg pcp. The patient voiced understanding and agreement to the plan.   [place], DO 06/10/20  8:00 AM

## 2020-06-20 DIAGNOSIS — Z23 Encounter for immunization: Secondary | ICD-10-CM | POA: Diagnosis not present

## 2020-06-22 ENCOUNTER — Other Ambulatory Visit: Payer: Self-pay | Admitting: Family Medicine

## 2020-06-23 ENCOUNTER — Telehealth (INDEPENDENT_AMBULATORY_CARE_PROVIDER_SITE_OTHER): Payer: Medicare Other | Admitting: Family Medicine

## 2020-06-23 ENCOUNTER — Other Ambulatory Visit: Payer: Self-pay

## 2020-06-23 DIAGNOSIS — E890 Postprocedural hypothyroidism: Secondary | ICD-10-CM | POA: Diagnosis not present

## 2020-06-23 DIAGNOSIS — E119 Type 2 diabetes mellitus without complications: Secondary | ICD-10-CM | POA: Diagnosis not present

## 2020-06-23 DIAGNOSIS — I10 Essential (primary) hypertension: Secondary | ICD-10-CM

## 2020-06-23 DIAGNOSIS — E782 Mixed hyperlipidemia: Secondary | ICD-10-CM

## 2020-06-23 DIAGNOSIS — M542 Cervicalgia: Secondary | ICD-10-CM

## 2020-06-24 NOTE — Assessment & Plan Note (Signed)
Tolerating statin, encouraged heart healthy diet, avoid trans fats, minimize simple carbs and saturated fats. Increase exercise as tolerated 

## 2020-06-24 NOTE — Assessment & Plan Note (Signed)
On Levothyroxine, continue to monitor 

## 2020-06-24 NOTE — Progress Notes (Signed)
Subjective:    Patient ID: Monica Wallace, female    DOB: [birth year], 69 y.o.   MRN: [id number]  Chief Complaint  Patient presents with  . Follow-up    GI concerns    HPI Patient is in today for follow up on chronic medical concerns. She took her COVID booster this week and is struggling with nausea, anorexia, myalgias and fatigue. 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 She notes her GI symptoms are better with Colace and Miralax but she still has some intermittent abdominal pain/N/V and she is following with Dr Collene Mares of GI. Denies CP/palp/SOB/HA/congestion/fevers or GU c/o. Taking meds as prescribed  Past Medical History:  Diagnosis Date  . Arthritis   . Asthma   . Baker's cyst of knee, right 02/16/2017  . Cancer ([place])    thyroid (2002)  . Cystic breast   . GERD (gastroesophageal reflux disease)   . Hepatic cyst 02/02/2015  . Hyperlipidemia   . Hypertension   . Insomnia 07/23/2016  . Menopause   . Migraine   . Palpitations   . Preventative health care 01/18/2016  . Sleep apnea 02/02/2015  . Thyroid disease     Past Surgical History:  Procedure Laterality Date  . ABDOMINAL HYSTERECTOMY     took right ovary and uterus  . APPENDECTOMY    . CARPAL TUNNEL RELEASE Right   . CESAREAN SECTION    . CHOLECYSTECTOMY    . EYE SURGERY Bilateral 2017   cataracts  . KNEE ARTHROPLASTY    . THYROIDECTOMY  2002    Family History  Problem Relation Age of Onset  . Arthritis Mother   . Transient ischemic attack Mother   . Hypertension Mother   . Hyperlipidemia Mother   . Diabetes Father   . Heart disease Father   . Arthritis Father   . Kidney disease Father   . Hypertension Father   . Hyperlipidemia Father   . Cancer Sister 32       pancreatic  . Fibromyalgia Sister   . Allergic rhinitis Sister   . Asthma Maternal Grandfather        smoker  . Depression Maternal Grandfather   . Dementia Paternal Grandmother   . Diabetes  Paternal Grandfather   . Hypertension Brother   . Allergic rhinitis Brother   . Scoliosis Daughter        had rods placed and removed  . Arthritis Sister   . Gout Sister   . GER disease Sister   . Cancer Sister        breast cancer  . Breast cancer Sister 48    Social History   Socioeconomic History  . Marital status: Divorced    Spouse name: Not on file  . Number of children: Not on file  . Years of education: Not on file  . Highest education level: Not on file  Occupational History  . Not on file  Tobacco Use  . Smoking status: Never Smoker  . Smokeless tobacco: Never Used  Vaping Use  . Vaping Use: Never used  Substance and Sexual Activity  . Alcohol use: Yes    Alcohol/week: 0.0 standard drinks    Comment: very rare once or twice a year  . Drug use: No  . Sexual activity: Yes    Birth control/protection: Other-see comments, Surgical    Comment:  boyfriend  Other Topics Concern  . Not on file  Social History Narrative  . Not on file   Social Determinants of Health   Financial Resource Strain: Low Risk   . Difficulty of Paying Living Expenses: Not hard at all  Food Insecurity:   . Worried About Charity fundraiser in the Last Year: Not on file  . Ran Out of Food in the Last Year: Not on file  Transportation Needs:   . Lack of Transportation (Medical): Not on file  . Lack of Transportation (Non-Medical): Not on file  Physical Activity:   . Days of Exercise per Week: Not on file  . Minutes of Exercise per Session: Not on file  Stress:   . Feeling of Stress : Not on file  Social Connections:   . Frequency of Communication with Friends and Family: Not on file  . Frequency of Social Gatherings with Friends and Family: Not on file  . Attends Religious Services: Not on file  . Active Member of Clubs or Organizations: Not on file  . Attends Archivist Meetings: Not on file  . Marital Status: Not on file  Intimate Partner Violence:   . Fear of  Current or Ex-Partner: Not on file  . Emotionally Abused: Not on file  . Physically Abused: Not on file  . Sexually Abused: Not on file    Outpatient Medications Prior to Visit  Medication Sig Dispense Refill  . acetaminophen (TYLENOL) 325 MG tablet Take 650 mg by mouth every 6 (six) hours as needed.    Marland Kitchen albuterol (VENTOLIN HFA) 108 (90 Base) MCG/ACT inhaler INHALE 2 PUFFS INTO THE LUNGS EVERY 6 HOURS AS NEEDED FOR WHEEZING OR SHORTNESS OF BREATH (Patient taking differently: Inhale 2 puffs into the lungs every 6 (six) hours as needed for wheezing or shortness of breath. ) 25.5 g 1  . Ascorbic Acid (VITAMIN C) 100 MG tablet Take 100 mg by mouth as needed.     Marland Kitchen azelastine (ASTELIN) 0.1 % nasal spray Place 1 spray into both nostrils 2 (two) times daily. Use in each nostril as directed 30 mL 5  . Blood Glucose Monitoring Suppl ([place]) w/Device KIT Use to check blood glucose once a day.  DX code: E11.9 1 kit 0  . budesonide (PULMICORT) 180 MCG/ACT inhaler Inhale 1-2 puffs into the lungs 2 (two) times daily. (Patient taking differently: Inhale 2 puffs into the lungs daily. ) 1 each 3  . Cholecalciferol ([employer] VIT D 5000 HIGH-POTENCY PO) Take 5,000 Units by mouth.    . fluticasone (FLONASE) 50 MCG/ACT nasal spray Place 2 sprays into both nostrils daily. 48 g 1  . fluticasone (FLOVENT HFA) 110 MCG/ACT inhaler Inhale 2 puffs into the lungs 2 (two) times daily. 1 each 5  . glucose blood (ONETOUCH VERIO) test strip Use to check blood glucose once a day.  DX code: E11.9 100 each 1  . Lancets (ONETOUCH DELICA PLUS [id number]) MISC Use to check blood glucose once a day.  DX code: E11.9 100 each 1  . loratadine (CLARITIN) 10 MG tablet Take 10 mg by mouth in the morning and at bedtime.     . montelukast (SINGULAIR) 10 MG tablet Take 1 tablet (10 mg total) by mouth daily as needed. 90 tablet 3  . Olopatadine HCl 0.2 % SOLN Apply 1 drop to eye daily as needed. 2.5 mL 5  . Omega-3 1000 MG CAPS  Take 1,000 mg by mouth daily.    Marland Kitchen omeprazole (PRILOSEC) 20 MG capsule Take 20 mg  by mouth daily.    . rosuvastatin (CRESTOR) 5 MG tablet TAKE 1 TABLET BY MOUTH 2 TIMES A WEEK(TUESDAYS AND SUNDAYS WEEKLY) 25 tablet 3  . SYNTHROID 150 MCG tablet Take 1 tablet (150 mcg total) by mouth daily before breakfast. 90 tablet 0  . tiZANidine (ZANAFLEX) 2 MG tablet Take 0.5-2 tablets (1-4 mg total) by mouth 2 (two) times daily as needed for muscle spasms. 30 tablet 1  . triamterene-hydrochlorothiazide (MAXZIDE) 75-50 MG tablet Take 1 tablet by mouth daily. 90 tablet 0  . meloxicam (MOBIC) 7.5 MG tablet Take 1 tablet (7.5 mg total) by mouth daily. (Patient not taking: Reported on 06/23/2020) 30 tablet 0   No facility-administered medications prior to visit.    Allergies  Allergen Reactions  . Levaquin [Levofloxacin] Shortness Of Breath  . Clindamycin/Lincomycin     Pruritus No rash  . Gabapentin Itching  . Penicillins     Positive on allergy testing 'years ago.' Has never had penicillin. Has tolerated amoxicillin.  . Codeine Itching    Review of Systems  Constitutional: Positive for malaise/fatigue. Negative for fever.  HENT: Negative for congestion.   Eyes: Negative for blurred vision.  Respiratory: Negative for shortness of breath.   Cardiovascular: Negative for chest pain, palpitations and leg swelling.  Gastrointestinal: Positive for nausea. Negative for abdominal pain and blood in stool.  Genitourinary: Negative for dysuria and frequency.  Musculoskeletal: Positive for myalgias. Negative for falls.  Skin: Negative for rash.  Neurological: Negative for dizziness, loss of consciousness and headaches.  Endo/Heme/Allergies: Negative for environmental allergies.  Psychiatric/Behavioral: Negative for depression. The patient is not nervous/anxious.        Objective:    Physical Exam Vitals and nursing note reviewed.  Constitutional:      General: She is not in acute distress.     Appearance: She is well-developed.  HENT:     Head: Normocephalic and atraumatic.     Nose: Nose normal.  Eyes:     General:        Right eye: No discharge.        Left eye: No discharge.  Cardiovascular:     Rate and Rhythm: Normal rate and regular rhythm.     Heart sounds: No murmur heard.   Pulmonary:     Effort: Pulmonary effort is normal.     Breath sounds: Normal breath sounds.  Abdominal:     General: Bowel sounds are normal.     Palpations: Abdomen is soft.     Tenderness: There is no abdominal tenderness.  Musculoskeletal:     Cervical back: Normal range of motion and neck supple.  Skin:    General: Skin is warm and dry.  Neurological:     Mental Status: She is alert and oriented to person, place, and time.     There were no vitals taken for this visit. Wt Readings from Last 3 Encounters:  06/10/20 201 lb (91.2 kg)  04/08/20 200 lb 13.4 oz (91.1 kg)  02/06/20 207 lb 6.4 oz (94.1 kg)    Diabetic Foot Exam - Simple   No data filed     Lab Results  Component Value Date   WBC 8.7 02/06/2020   HGB 13.0 02/06/2020   HCT 39.0 02/06/2020   PLT 299.0 02/06/2020   GLUCOSE 93 02/06/2020   CHOL 219 (H) 02/06/2020   TRIG 208.0 (H) 02/06/2020   HDL 50.80 02/06/2020   LDLDIRECT 132.0 02/06/2020   LDLCALC 119 (H) 06/05/2018   ALT  17 02/06/2020   AST 17 02/06/2020   NA 138 02/06/2020   K 3.9 02/06/2020   CL 98 02/06/2020   CREATININE 0.80 02/06/2020   BUN 13 02/06/2020   CO2 34 (H) 02/06/2020   TSH 0.47 02/06/2020   HGBA1C 6.6 (H) 02/06/2020    Lab Results  Component Value Date   TSH 0.47 02/06/2020   Lab Results  Component Value Date   WBC 8.7 02/06/2020   HGB 13.0 02/06/2020   HCT 39.0 02/06/2020   MCV 91.5 02/06/2020   PLT 299.0 02/06/2020   Lab Results  Component Value Date   NA 138 02/06/2020   K 3.9 02/06/2020   CO2 34 (H) 02/06/2020   GLUCOSE 93 02/06/2020   BUN 13 02/06/2020   CREATININE 0.80 02/06/2020   BILITOT 0.2 02/06/2020    ALKPHOS 74 02/06/2020   AST 17 02/06/2020   ALT 17 02/06/2020   PROT 6.7 02/06/2020   ALBUMIN 3.9 02/06/2020   CALCIUM 10.0 02/06/2020   ANIONGAP 9 01/16/2020   GFR 85.57 02/06/2020   Lab Results  Component Value Date   CHOL 219 (H) 02/06/2020   Lab Results  Component Value Date   HDL 50.80 02/06/2020   Lab Results  Component Value Date   LDLCALC 119 (H) 06/05/2018   Lab Results  Component Value Date   TRIG 208.0 (H) 02/06/2020   Lab Results  Component Value Date   CHOLHDL 4 02/06/2020   Lab Results  Component Value Date   HGBA1C 6.6 (H) 02/06/2020       Assessment & Plan:   Problem List Items Addressed This Visit    Hyperlipidemia    Tolerating statin, encouraged heart healthy diet, avoid trans fats, minimize simple carbs and saturated fats. Increase exercise as tolerated      HTN (hypertension)    Well controlled, no changes to meds. Encouraged heart healthy diet such as the DASH diet and exercise as tolerated.       Hypothyroidism    On Levothyroxine, continue to monitor      Controlled type 2 diabetes mellitus without complication, without long-term current use of insulin (HCC)    /hgba1c acceptable, minimize simple carbs. Increase exercise as tolerated. Continue current meds      Neck pain    Encouraged moist heat and gentle stretching as tolerated. May try NSAIDs and prescription meds as directed and report if symptoms worsen or seek immediate care. She is doing much better and largely avoided taking meds. She reports stretching helped the most. She will consider PT if symptoms worsen         I have discontinued Bryony Inks's meloxicam. I am also having her maintain her loratadine, Cholecalciferol ([employer]S VIT D 5000 HIGH-POTENCY PO), acetaminophen, albuterol, montelukast, Omega-3, vitamin C, fluticasone, budesonide, tiZANidine, OneTouch Delica Plus [id number], OneTouch Verio Flex System, OneTouch Verio, omeprazole, Olopatadine HCl, azelastine, Flovent  HFA, rosuvastatin, triamterene-hydrochlorothiazide, and Synthroid.  No orders of the defined types were placed in this encounter.    [name], MD

## 2020-06-24 NOTE — Assessment & Plan Note (Signed)
Well controlled, no changes to meds. Encouraged heart healthy diet such as the DASH diet and exercise as tolerated.  °

## 2020-06-24 NOTE — Assessment & Plan Note (Signed)
Encouraged moist heat and gentle stretching as tolerated. May try NSAIDs and prescription meds as directed and report if symptoms worsen or seek immediate care. She is doing much better and largely avoided taking meds. She reports stretching helped the most. She will consider PT if symptoms worsen

## 2020-06-24 NOTE — Assessment & Plan Note (Signed)
hgba1c acceptable, minimize simple carbs. Increase exercise as tolerated. Continue current meds 

## 2020-06-28 ENCOUNTER — Other Ambulatory Visit: Payer: Self-pay | Admitting: Family Medicine

## 2020-07-07 DIAGNOSIS — K5900 Constipation, unspecified: Secondary | ICD-10-CM | POA: Diagnosis not present

## 2020-07-20 ENCOUNTER — Ambulatory Visit: Payer: Medicare Other | Admitting: Allergy and Immunology

## 2020-07-21 ENCOUNTER — Ambulatory Visit (INDEPENDENT_AMBULATORY_CARE_PROVIDER_SITE_OTHER): Payer: Medicare Other | Admitting: Allergy and Immunology

## 2020-07-21 ENCOUNTER — Other Ambulatory Visit: Payer: Self-pay

## 2020-07-21 ENCOUNTER — Encounter: Payer: Self-pay | Admitting: Allergy and Immunology

## 2020-07-21 ENCOUNTER — Telehealth: Payer: Self-pay | Admitting: Allergy and Immunology

## 2020-07-21 DIAGNOSIS — J3089 Other allergic rhinitis: Secondary | ICD-10-CM | POA: Diagnosis not present

## 2020-07-21 DIAGNOSIS — J4540 Moderate persistent asthma, uncomplicated: Secondary | ICD-10-CM | POA: Diagnosis not present

## 2020-07-21 MED ORDER — FLOVENT HFA 110 MCG/ACT IN AERO: 2.0000 | INHALATION_SPRAY | Freq: Two times a day (BID) | RESPIRATORY_TRACT | 5 refills | Status: DC

## 2020-07-21 NOTE — Telephone Encounter (Signed)
Pt states pharmacy says Flovent will cost $300, pt asks if there is another alternative or can this be resolved?

## 2020-07-21 NOTE — Patient Instructions (Addendum)
Moderate persistent asthma  A prior authorization will be sent in for Flovent 110 g, 2 inhalations via spacer device twice daily.  During upper respiratory tract infections and/or asthma flares, increase Flovent to 3 inhalations via spacer device 3 times daily until symptoms have returned to baseline.  Continue montelukast 10 mg daily at bedtime.  Continue albuterol every 4-6 hours if needed.  Subjective and objective measures of pulmonary function will be followed and the treatment plan will be adjusted accordingly.  Allergic rhinoconjunctivitis  Continue appropriate aeroallergen avoidance measures.  Continue azelastine/fluticasone nasal spray, 1 spray per nostril twice daily as needed. Proper nasal spray technique has been discussed and demonstrated.  Continue nasal saline lavage (NeilMed) as needed and prior to medicated nasal sprays.  For thick post nasal drainage, add guaifenesin [phone] mg (Mucinex)  twice daily as needed with adequate hydration as discussed.  For itchy/watery eyes, use over-the-counter Pataday extra strength or Zaditor if needed.   Return in about 4 months (around 11/18/2020), or if symptoms worsen or fail to improve.

## 2020-07-21 NOTE — Progress Notes (Signed)
Follow-up Note  RE: Monica Wallace MRN: [id number] DOB: 07/21/1950 Date of Office Visit: 07/21/2020  Primary care provider: [name], MD Referring provider: [name], MD  History of present illness: Monica Wallace is a 70 y.o. female with persistent asthma and allergic rhinoconjunctivitis presenting today for follow-up.  She was initially seen in this clinic for initial consultation on April 08, 2020.  She reports that, for unclear reasons, she was unable to pick up Flovent from the pharmacy.  Therefore, she continued to use Pulmicort, however the Pulmicort causes dry mouth and coughing.  Other than the occasional coughing, which she believes is from the Pulmicort, her asthma has been stable/well-controlled.  She reports significant improvement while using azelastine/fluticasone nasal spray and nasal saline lavage, however still complains of thick postnasal drainage which is particularly problematic at nighttime.   Assessment and plan: Moderate persistent asthma  A prior authorization will be sent in for Flovent 110 g, 2 inhalations via spacer device twice daily.  During upper respiratory tract infections and/or asthma flares, increase Flovent to 3 inhalations via spacer device 3 times daily until symptoms have returned to baseline.  Continue montelukast 10 mg daily at bedtime.  Continue albuterol every 4-6 hours if needed.  Subjective and objective measures of pulmonary function will be followed and the treatment plan will be adjusted accordingly.  Allergic rhinoconjunctivitis  Continue appropriate aeroallergen avoidance measures.  Continue azelastine/fluticasone nasal spray, 1 spray per nostril twice daily as needed. Proper nasal spray technique has been discussed and demonstrated.  Continue nasal saline lavage (NeilMed) as needed and prior to medicated nasal sprays.  For thick post nasal drainage, add guaifenesin [phone] mg (Mucinex)  twice daily as needed with  adequate hydration as discussed.  For itchy/watery eyes, use over-the-counter Pataday extra strength or Zaditor if needed.   Meds ordered this encounter  Medications  . fluticasone (FLOVENT HFA) 110 MCG/ACT inhaler    Sig: Inhale 2 puffs into the lungs 2 (two) times daily.    Dispense:  1 each    Refill:  5    Diagnostics: Due to Covid precautions, spirometry was not performed today as the patient's symptoms are reported as well controlled and pulmonary exam was normal.    Physical examination: Blood pressure 122/78, pulse 97, temperature (!) 96.2 F (35.7 C), temperature source Tympanic, resp. rate 16, SpO2 97 %.  General: Alert, interactive, in no acute distress. HEENT: TMs pearly gray, turbinates mildly edematous without discharge, post-pharynx moderately erythematous. Neck: Supple without lymphadenopathy. Lungs: Clear to auscultation without wheezing, rhonchi or rales. CV: Normal S1, S2 without murmurs. Skin: Warm and dry, without lesions or rashes.  The following portions of the patient's history were reviewed and updated as appropriate: allergies, current medications, past family history, past medical history, past social history, past surgical history and problem list.  Current Outpatient Medications  Medication Sig Dispense Refill  . acetaminophen (TYLENOL) 325 MG tablet Take 650 mg by mouth every 6 (six) hours as needed.    Marland Kitchen albuterol (VENTOLIN HFA) 108 (90 Base) MCG/ACT inhaler INHALE 2 PUFFS INTO THE LUNGS EVERY 6 HOURS AS NEEDED FOR WHEEZING OR SHORTNESS OF BREATH (Patient taking differently: Inhale 2 puffs into the lungs every 6 (six) hours as needed for wheezing or shortness of breath.) 25.5 g 1  . Ascorbic Acid (VITAMIN C) 100 MG tablet Take 100 mg by mouth as needed.     Marland Kitchen azelastine (ASTELIN) 0.1 % nasal spray Place 1 spray into both nostrils  2 (two) times daily. Use in each nostril as directed 30 mL 5  . Blood Glucose Monitoring Suppl ([place]) w/Device KIT Use to check blood glucose once a day.  DX code: E11.9 1 kit 0  . budesonide (PULMICORT) 180 MCG/ACT inhaler Inhale 1-2 puffs into the lungs 2 (two) times daily. (Patient taking differently: Inhale 2 puffs into the lungs daily.) 1 each 3  . Cholecalciferol ([employer]S VIT D 5000 HIGH-POTENCY PO) Take 5,000 Units by mouth.    . fluticasone (FLONASE) 50 MCG/ACT nasal spray Place 2 sprays into both nostrils daily. 48 g 1  . glucose blood (ONETOUCH VERIO) test strip Use to check blood glucose once a day.  DX code: E11.9 100 each 1  . Lancets (ONETOUCH DELICA PLUS [id number]) MISC Use to check blood glucose once a day.  DX code: E11.9 100 each 1  . loratadine (CLARITIN) 10 MG tablet Take 10 mg by mouth in the morning and at bedtime.     . montelukast (SINGULAIR) 10 MG tablet Take 1 tablet (10 mg total) by mouth daily as needed. 90 tablet 3  . Olopatadine HCl 0.2 % SOLN Apply 1 drop to eye daily as needed. 2.5 mL 5  . Omega-3 1000 MG CAPS Take 1,000 mg by mouth daily.    Marland Kitchen omeprazole (PRILOSEC) 20 MG capsule Take 20 mg by mouth daily.    . fluticasone (FLOVENT HFA) 110 MCG/ACT inhaler Inhale 2 puffs into the lungs 2 (two) times daily. 1 each 5  . rosuvastatin (CRESTOR) 5 MG tablet TAKE 1 TABLET BY MOUTH 2 TIMES A WEEK(TUESDAYS AND SUNDAYS WEEKLY) 25 tablet 3  . SYNTHROID 150 MCG tablet TAKE 1 TABLET(150 MCG) BY MOUTH DAILY BEFORE AND BREAKFAST 90 tablet 0  . tiZANidine (ZANAFLEX) 2 MG tablet Take 0.5-2 tablets (1-4 mg total) by mouth 2 (two) times daily as needed for muscle spasms. 30 tablet 1  . triamterene-hydrochlorothiazide (MAXZIDE) 75-50 MG tablet Take 1 tablet by mouth daily. 90 tablet 0   No current facility-administered medications for this visit.    Allergies  Allergen Reactions  . Levaquin [Levofloxacin] Shortness Of Breath  . Clindamycin/Lincomycin     Pruritus No rash  . Gabapentin Itching  . Penicillins     Positive on allergy testing 'years ago.' Has never had  penicillin. Has tolerated amoxicillin.  . Codeine Itching    I appreciate the opportunity to take part in [place] care. Please do not hesitate to contact me with questions.  Sincerely,   R. Edgar Frisk, MD

## 2020-07-21 NOTE — Assessment & Plan Note (Signed)
   A prior authorization will be sent in for Flovent 110 g, 2 inhalations via spacer device twice daily.  During upper respiratory tract infections and/or asthma flares, increase Flovent to 3 inhalations via spacer device 3 times daily until symptoms have returned to baseline.  Continue montelukast 10 mg daily at bedtime.  Continue albuterol every 4-6 hours if needed.  Subjective and objective measures of pulmonary function will be followed and the treatment plan will be adjusted accordingly.

## 2020-07-21 NOTE — Assessment & Plan Note (Addendum)
   Continue appropriate aeroallergen avoidance measures.  Continue azelastine/fluticasone nasal spray, 1 spray per nostril twice daily as needed. Proper nasal spray technique has been discussed and demonstrated.  Continue nasal saline lavage (NeilMed) as needed and prior to medicated nasal sprays.  For thick post nasal drainage, add guaifenesin [phone] mg (Mucinex)  twice daily as needed with adequate hydration as discussed.  For itchy/watery eyes, use over-the-counter Pataday extra strength or Zaditor if needed.

## 2020-07-22 NOTE — Telephone Encounter (Signed)
Lm for pt to call us back about this matter 

## 2020-08-20 ENCOUNTER — Telehealth: Payer: Self-pay | Admitting: Family Medicine

## 2020-08-20 NOTE — Telephone Encounter (Signed)
Patient would like to know if is time for her to get lab done. Please advise

## 2020-08-20 NOTE — Telephone Encounter (Signed)
It looks like she need an appointment with Dr. Charlett Blake

## 2020-08-31 DIAGNOSIS — R070 Pain in throat: Secondary | ICD-10-CM | POA: Diagnosis not present

## 2020-08-31 DIAGNOSIS — H9201 Otalgia, right ear: Secondary | ICD-10-CM | POA: Diagnosis not present

## 2020-09-21 ENCOUNTER — Other Ambulatory Visit: Payer: Self-pay | Admitting: Family Medicine

## 2020-09-22 ENCOUNTER — Other Ambulatory Visit: Payer: Self-pay | Admitting: Family Medicine

## 2020-10-09 ENCOUNTER — Other Ambulatory Visit: Payer: Self-pay

## 2020-10-09 ENCOUNTER — Encounter (HOSPITAL_BASED_OUTPATIENT_CLINIC_OR_DEPARTMENT_OTHER): Payer: Self-pay | Admitting: Emergency Medicine

## 2020-10-09 ENCOUNTER — Emergency Department (HOSPITAL_BASED_OUTPATIENT_CLINIC_OR_DEPARTMENT_OTHER)
Admission: EM | Admit: 2020-10-09 | Discharge: 2020-10-09 | Disposition: A | Discharge status: Home / Self Care | Payer: Medicare Other | Attending: Emergency Medicine | Admitting: Emergency Medicine

## 2020-10-09 ENCOUNTER — Other Ambulatory Visit (HOSPITAL_COMMUNITY): Payer: Self-pay | Admitting: Emergency Medicine

## 2020-10-09 ENCOUNTER — Emergency Department (HOSPITAL_BASED_OUTPATIENT_CLINIC_OR_DEPARTMENT_OTHER): Payer: Medicare Other

## 2020-10-09 DIAGNOSIS — Z96659 Presence of unspecified artificial knee joint: Secondary | ICD-10-CM | POA: Diagnosis not present

## 2020-10-09 DIAGNOSIS — Z20822 Contact with and (suspected) exposure to covid-19: Secondary | ICD-10-CM | POA: Insufficient documentation

## 2020-10-09 DIAGNOSIS — I10 Essential (primary) hypertension: Secondary | ICD-10-CM | POA: Insufficient documentation

## 2020-10-09 DIAGNOSIS — Z8616 Personal history of COVID-19: Secondary | ICD-10-CM | POA: Insufficient documentation

## 2020-10-09 DIAGNOSIS — J4521 Mild intermittent asthma with (acute) exacerbation: Secondary | ICD-10-CM

## 2020-10-09 DIAGNOSIS — E119 Type 2 diabetes mellitus without complications: Secondary | ICD-10-CM | POA: Insufficient documentation

## 2020-10-09 DIAGNOSIS — E039 Hypothyroidism, unspecified: Secondary | ICD-10-CM | POA: Diagnosis not present

## 2020-10-09 DIAGNOSIS — Z7952 Long term (current) use of systemic steroids: Secondary | ICD-10-CM | POA: Diagnosis not present

## 2020-10-09 DIAGNOSIS — Z79899 Other long term (current) drug therapy: Secondary | ICD-10-CM | POA: Diagnosis not present

## 2020-10-09 DIAGNOSIS — Z85850 Personal history of malignant neoplasm of thyroid: Secondary | ICD-10-CM | POA: Insufficient documentation

## 2020-10-09 DIAGNOSIS — J45909 Unspecified asthma, uncomplicated: Secondary | ICD-10-CM | POA: Insufficient documentation

## 2020-10-09 DIAGNOSIS — R059 Cough, unspecified: Secondary | ICD-10-CM | POA: Diagnosis not present

## 2020-10-09 DIAGNOSIS — R0602 Shortness of breath: Secondary | ICD-10-CM | POA: Diagnosis not present

## 2020-10-09 LAB — SARS CORONAVIRUS 2 (TAT 6-24 HRS): SARS Coronavirus 2: NEGATIVE

## 2020-10-09 MED ORDER — PREDNISONE 20 MG PO TABS: 40.0000 mg | ORAL_TABLET | Freq: Every day | ORAL | 0 refills | Status: DC

## 2020-10-09 MED ORDER — PREDNISONE 50 MG PO TABS
60.0000 mg | ORAL_TABLET | Freq: Once | ORAL | Status: AC
  Administered 2020-10-09: 60 mg via ORAL
  Filled 2020-10-09: qty 1

## 2020-10-09 MED ORDER — ALBUTEROL SULFATE HFA 108 (90 BASE) MCG/ACT IN AERS
8.0000 | INHALATION_SPRAY | Freq: Once | RESPIRATORY_TRACT | Status: AC
  Administered 2020-10-09: 8 via RESPIRATORY_TRACT
  Filled 2020-10-09: qty 6.7

## 2020-10-09 MED FILL — predniSONE 20 MG TABS: 20 | 4 days supply | Qty: 8 | Fill #0

## 2020-10-09 NOTE — ED Provider Notes (Signed)
[facility] Provider Note   CSN: [id number] Arrival date & time: 10/09/20  1204     History Chief Complaint  Patient presents with  . Shortness of Breath    Monica Wallace is a 70 y.o. female.  HPI 70 year old female with a history of asthma presents with shortness of breath and cough and wheezing.  Last week she developed a URI that she think she got from a family member who was diagnosed with a virus.  Seem to be getting better about 5 days ago but then all of a sudden this morning the cough seem to come back and she is having wheezing and shortness of breath.  No fevers though she feels fatigued.  Tried her albuterol with only minimal partial relief.   Past Medical History:  Diagnosis Date  . Arthritis   . Asthma   . Baker's cyst of knee, right 02/16/2017  . Cancer ([place])    thyroid (2002)  . Cystic breast   . Diabetes ([place])   . GERD (gastroesophageal reflux disease)   . Hepatic cyst 02/02/2015  . Hyperlipidemia   . Hypertension   . Insomnia 07/23/2016  . Menopause   . Migraine   . Palpitations   . Preventative health care 01/18/2016  . Sleep apnea 02/02/2015  . Thyroid disease     Patient Active Problem List   Diagnosis Date Noted  . Allergic conjunctivitis 04/08/2020  . Neck pain 02/06/2020  . Acute bronchitis 01/13/2020  . Otitis media 09/30/2019  . Hip pain, right 09/30/2019  . COVID-19 09/30/2019  . Educated about COVID-19 virus infection 12/05/2018  . High risk heterosexual behavior 11/20/2017  . Baker's cyst of knee, right 02/16/2017  . Insomnia 07/23/2016  . Hypokalemia 07/23/2016  . Controlled type 2 diabetes mellitus without complication, without long-term current use of insulin ([place]) 01/24/2016  . Skin lesion of right leg 01/24/2016  . Preventative health care 01/18/2016  . Left-sided low back pain with left-sided sciatica 10/07/2015  . Obesity (BMI 30-39.9) 06/05/2015  . OSA (obstructive sleep apnea) 02/02/2015  .  Palpitations 09/19/2014  . Plantar fasciitis of right foot 11/19/2013  . Tenosynovitis of foot and ankle 11/19/2013  . Pronation deformity of ankle, acquired 11/19/2013  . DJD (degenerative joint disease) 08/26/2013  . Fibrocystic breast 08/26/2013  . HTN (hypertension) 08/26/2013  . GERD (gastroesophageal reflux disease) 08/26/2013  . S/P hysterectomy with oophorectomy 08/26/2013  . Hypothyroidism 08/26/2013  . Chronic hoarseness 08/26/2013  . Colon polyp 08/26/2013  . Hiatal hernia 08/26/2013  . Irritable bowel syndrome 08/26/2013  . Moderate persistent asthma 08/26/2013  . Allergic rhinoconjunctivitis 08/26/2013  . Hyperlipidemia 08/22/2013  . Migraine 08/22/2013  . Thyroid cancer ([place]) 08/22/2013    Past Surgical History:  Procedure Laterality Date  . ABDOMINAL HYSTERECTOMY     took right ovary and uterus  . APPENDECTOMY    . CARPAL TUNNEL RELEASE Right   . CESAREAN SECTION    . CHOLECYSTECTOMY    . EYE SURGERY Bilateral 2017   cataracts  . KNEE ARTHROPLASTY    . THYROIDECTOMY  2002     OB History    Gravida  2   Para      Term      Preterm      AB      Living  2     SAB      IAB      Ectopic      Multiple  Live Births              Family History  Problem Relation Age of Onset  . Arthritis Mother   . Transient ischemic attack Mother   . Hypertension Mother   . Hyperlipidemia Mother   . Diabetes Father   . Heart disease Father   . Arthritis Father   . Kidney disease Father   . Hypertension Father   . Hyperlipidemia Father   . Cancer Sister 6       pancreatic  . Fibromyalgia Sister   . Allergic rhinitis Sister   . Asthma Maternal Grandfather        smoker  . Depression Maternal Grandfather   . Dementia Paternal Grandmother   . Diabetes Paternal Grandfather   . Hypertension Brother   . Allergic rhinitis Brother   . Scoliosis Daughter        had rods placed and removed  . Arthritis Sister   . Gout Sister   . GER disease  Sister   . Cancer Sister        breast cancer  . Breast cancer Sister 74    Social History   Tobacco Use  . Smoking status: Never Smoker  . Smokeless tobacco: Never Used  Vaping Use  . Vaping Use: Never used  Substance Use Topics  . Alcohol use: Yes    Alcohol/week: 0.0 standard drinks    Comment: very rare once or twice a year  . Drug use: No    Home Medications Prior to Admission medications   Medication Sig Start Date End Date Taking? Authorizing Provider  predniSONE (DELTASONE) 20 MG tablet Take 2 tablets (40 mg total) by mouth daily. 10/09/20  Yes Sherwood Gambler, MD  acetaminophen (TYLENOL) 325 MG tablet Take 650 mg by mouth every 6 (six) hours as needed.    [provider]  albuterol (VENTOLIN HFA) 108 (90 Base) MCG/ACT inhaler INHALE 2 PUFFS INTO THE LUNGS EVERY 6 HOURS AS NEEDED FOR WHEEZING OR SHORTNESS OF BREATH Patient taking differently: Inhale 2 puffs into the lungs every 6 (six) hours as needed for wheezing or shortness of breath. 10/01/19   [name], MD  Ascorbic Acid (VITAMIN C) 100 MG tablet Take 100 mg by mouth as needed.     [provider]  azelastine (ASTELIN) 0.1 % nasal spray Place 1 spray into both nostrils 2 (two) times daily. Use in each nostril as directed 04/08/20   Bobbitt, Sed[place] Muta, MD  Blood Glucose Monitoring Suppl ([place]) w/Device KIT Use to check blood glucose once a day.  DX code: E11.9 02/06/20   [name], MD  budesonide (PULMICORT) 180 MCG/ACT inhaler Inhale 1-2 puffs into the lungs 2 (two) times daily. Patient taking differently: Inhale 2 puffs into the lungs daily. 01/23/20   Copland, Gay Filler, MD  Cholecalciferol ([employer] VIT D 5000 HIGH-POTENCY PO) Take 5,000 Units by mouth.    [provider]  fluticasone (FLONASE) 50 MCG/ACT nasal spray SHAKE LIQUID AND USE 2 SPRAYS IN EACH NOSTRIL DAILY 09/21/20   [name], MD  fluticasone (FLOVENT HFA) 110 MCG/ACT inhaler Inhale 2 puffs into  the lungs 2 (two) times daily. 07/21/20   Bobbitt, Sed[place] Muta, MD  glucose blood (ONETOUCH VERIO) test strip Use to check blood glucose once a day.  DX code: E11.9 02/06/20   [name], MD  Lancets (ONETOUCH DELICA PLUS [id number]) MISC Use to check blood glucose once a day.  DX  code: E11.9 02/06/20   [name], MD  loratadine (CLARITIN) 10 MG tablet Take 10 mg by mouth in the morning and at bedtime.     [provider]  montelukast (SINGULAIR) 10 MG tablet Take 1 tablet (10 mg total) by mouth daily as needed. 11/04/19   [name], MD  Olopatadine HCl 0.2 % SOLN Apply 1 drop to eye daily as needed. 04/08/20   Bobbitt, Sed[place] Muta, MD  Omega-3 1000 MG CAPS Take 1,000 mg by mouth daily.    [provider]  omeprazole (PRILOSEC) 20 MG capsule Take 20 mg by mouth daily.    [provider]  rosuvastatin (CRESTOR) 5 MG tablet TAKE 1 TABLET BY MOUTH 2 TIMES A WEEK(TUESDAYS AND SUNDAYS WEEKLY) 05/18/20   [name], MD  SYNTHROID 150 MCG tablet TAKE 1 TABLET(150 MCG) BY MOUTH DAILY BEFORE AND BREAKFAST 06/29/20   [name], MD  tiZANidine (ZANAFLEX) 2 MG tablet Take 0.5-2 tablets (1-4 mg total) by mouth 2 (two) times daily as needed for muscle spasms. 02/06/20   [name], MD  triamterene-hydrochlorothiazide (MAXZIDE) 75-50 MG tablet Take 1 tablet by mouth daily. 09/23/20   [name], MD    Allergies    Levaquin [levofloxacin], Clindamycin/lincomycin, Gabapentin, Penicillins, and Codeine  Review of Systems   Review of Systems  Constitutional: Positive for fatigue. Negative for fever.  Respiratory: Positive for cough, shortness of breath and wheezing.   Gastrointestinal: Negative for vomiting.  All other systems reviewed and are negative.   Physical Exam Updated Vital Signs BP (!) 159/89   Pulse 82   Temp 97.6 F (36.4 C) (Oral)   Resp 20   Ht _0  (1.575 m)   Wt 91.8 kg   SpO2 100%   BMI 37.02 kg/m   Physical Exam Vitals and  nursing note reviewed.  Constitutional:      Appearance: She is well-developed.  HENT:     Head: Normocephalic and atraumatic.     Right Ear: External ear normal.     Left Ear: External ear normal.     Nose: Nose normal.  Eyes:     General:        Right eye: No discharge.        Left eye: No discharge.  Cardiovascular:     Rate and Rhythm: Normal rate and regular rhythm.     Heart sounds: Normal heart sounds.  Pulmonary:     Effort: Pulmonary effort is normal. No tachypnea, accessory muscle usage or respiratory distress.     Breath sounds: Wheezing (diffuse, expiratory) present.  Abdominal:     Palpations: Abdomen is soft.     Tenderness: There is no abdominal tenderness.  Skin:    General: Skin is warm and dry.  Neurological:     Mental Status: She is alert.  Psychiatric:        Mood and Affect: Mood is not anxious.     ED Results / Procedures / Treatments   Labs (all labs ordered are listed, but only abnormal results are displayed) Labs Reviewed  SARS CORONAVIRUS 2 (TAT 6-24 HRS)    EKG None  Radiology DG Chest 2 View  Result Date: 10/09/2020 CLINICAL DATA:  Shortness of breath and productive cough EXAM: CHEST - 2 VIEW COMPARISON:  01/13/2020.  09/04/2019 FINDINGS: Heart size is normal. Ordinary aortic atherosclerotic calcification. No pulmonary infiltrate, collapse or effusion. No significant bone finding. IMPRESSION: No active disease. Electronically Signed  By: Nelson Chimes M.D.   On: 10/09/2020 13:35    Procedures Procedures   Medications Ordered in ED Medications  predniSONE (DELTASONE) tablet 60 mg (60 mg Oral Given 10/09/20 1257)  albuterol (VENTOLIN HFA) 108 (90 Base) MCG/ACT inhaler 8 puff (8 puffs Inhalation Given 10/09/20 1301)    ED Course  I have reviewed the triage vital signs and the nursing notes.  Pertinent labs & imaging results that were available during my care of the patient were reviewed by me and considered in my medical decision  making (see chart for details).    MDM Rules/Calculators/A&P                          Patient presents with what sounds like an asthma exacerbation.  She is feeling better after albuterol.  She was given steroids and will be given a burst.  Chest x-ray has been reviewed which shows no obvious pneumonia.  Otherwise, she is well-appearing.  She has some hypertension but her vitals are otherwise benign.  She feels well enough to continue albuterol at home.  Will check for Covid but this sounds more like allergies versus URI.  Return precautions given. Final Clinical Impression(s) / ED Diagnoses Final diagnoses:  Mild intermittent asthma with exacerbation    Rx / DC Orders ED Discharge Orders         Ordered    predniSONE (DELTASONE) 20 MG tablet  Daily        10/09/20 1348           Sherwood Gambler, MD 10/09/20 1435

## 2020-10-09 NOTE — ED Triage Notes (Signed)
Recent cold symptoms  X 1 week, feeling lethargic x 2 days ,  Covid home test negative . Hx asthma , no relied with inhaler. Feeling short of breath.

## 2020-10-09 NOTE — ED Notes (Signed)
Treatment delayed for CXR

## 2020-10-09 NOTE — Discharge Instructions (Addendum)
You may use your albuterol inhaler 2 puffs every 4 hours as needed for cough, wheezing or shortness of breath.  If you need it significantly more than this then return to the ER for evaluation.  If you develop high fever, severe cough or cough with blood, trouble breathing, severe headache, neck pain/stiffness, vomiting, or any other new/concerning symptoms then return to the ER for evaluation

## 2020-10-13 ENCOUNTER — Other Ambulatory Visit: Payer: Self-pay

## 2020-10-13 ENCOUNTER — Other Ambulatory Visit (HOSPITAL_COMMUNITY): Payer: Self-pay | Admitting: Family

## 2020-10-13 ENCOUNTER — Ambulatory Visit (INDEPENDENT_AMBULATORY_CARE_PROVIDER_SITE_OTHER): Payer: Medicare Other | Admitting: Family

## 2020-10-13 VITALS — BP 147/88 | HR 87 | Temp 98.5°F | Resp 16 | Ht 61.5 in | Wt 199.8 lb

## 2020-10-13 DIAGNOSIS — J019 Acute sinusitis, unspecified: Secondary | ICD-10-CM | POA: Diagnosis not present

## 2020-10-13 DIAGNOSIS — J45901 Unspecified asthma with (acute) exacerbation: Secondary | ICD-10-CM | POA: Diagnosis not present

## 2020-10-13 MED ORDER — DOXYCYCLINE HYCLATE 100 MG PO TABS: 100.0000 mg | ORAL_TABLET | Freq: Two times a day (BID) | ORAL | 0 refills | Status: DC

## 2020-10-13 MED FILL — DOXYCYCLINE HYCLATE 100 MG: 100 | 10 days supply | Qty: 20 | Fill #0

## 2020-10-13 NOTE — Progress Notes (Signed)
Subjective:    Patient ID: Monica Wallace, female    DOB: March 25, 1951, 70 y.o.   MRN: [id number]  HPI   Patient is a 70 yr old female with hx of asthma, osa, and HTN, who presents today for ED follow up. She presented to the ED on 10/09/20 with c/o SOB, cough and wheezing.  This was following URI symptoms the week before.  CXR was clear and she was treated with a short prednisone burst and albuterol. Covid-19 testing was negative.   She reports that her breathing is improved. No longer coughing or wheezing, however she now notes increased pain/pressure in the sinuses and green drainage.  Energy is not improved.  These sinus symptoms are much worse than her typical seasonal allergies.  Review of Systems See HPI  Past Medical History:  Diagnosis Date  . Arthritis   . Asthma   . Baker's cyst of knee, right 02/16/2017  . Cancer ([place])    thyroid (2002)  . Cystic breast   . Diabetes ([place])   . GERD (gastroesophageal reflux disease)   . Hepatic cyst 02/02/2015  . Hyperlipidemia   . Hypertension   . Insomnia 07/23/2016  . Menopause   . Migraine   . Palpitations   . Preventative health care 01/18/2016  . Sleep apnea 02/02/2015  . Thyroid disease      Social History   Socioeconomic History  . Marital status: Divorced    Spouse name: Not on file  . Number of children: Not on file  . Years of education: Not on file  . Highest education level: Not on file  Occupational History  . Not on file  Tobacco Use  . Smoking status: Never Smoker  . Smokeless tobacco: Never Used  Vaping Use  . Vaping Use: Never used  Substance and Sexual Activity  . Alcohol use: Yes    Alcohol/week: 0.0 standard drinks    Comment: very rare once or twice a year  . Drug use: No  . Sexual activity: Yes    Birth control/protection: Other-see comments, Surgical    Comment:  boyfriend  Other Topics Concern  . Not on file  Social History Narrative  . Not on file   Social Determinants of Health   Financial  Resource Strain: Not on file  Food Insecurity: Not on file  Transportation Needs: Not on file  Physical Activity: Not on file  Stress: Not on file  Social Connections: Not on file  Intimate Partner Violence: Not on file    Past Surgical History:  Procedure Laterality Date  . ABDOMINAL HYSTERECTOMY     took right ovary and uterus  . APPENDECTOMY    . CARPAL TUNNEL RELEASE Right   . CESAREAN SECTION    . CHOLECYSTECTOMY    . EYE SURGERY Bilateral 2017   cataracts  . KNEE ARTHROPLASTY    . THYROIDECTOMY  2002    Family History  Problem Relation Age of Onset  . Arthritis Mother   . Transient ischemic attack Mother   . Hypertension Mother   . Hyperlipidemia Mother   . Diabetes Father   . Heart disease Father   . Arthritis Father   . Kidney disease Father   . Hypertension Father   . Hyperlipidemia Father   . Cancer Sister 60       pancreatic  . Fibromyalgia Sister   . Allergic rhinitis Sister   . Asthma Maternal Grandfather        smoker  . Depression  Maternal Grandfather   . Dementia Paternal Grandmother   . Diabetes Paternal Grandfather   . Hypertension Brother   . Allergic rhinitis Brother   . Scoliosis Daughter        had rods placed and removed  . Arthritis Sister   . Gout Sister   . GER disease Sister   . Cancer Sister        breast cancer  . Breast cancer Sister 57    Allergies  Allergen Reactions  . Levaquin [Levofloxacin] Shortness Of Breath  . Clindamycin/Lincomycin     Pruritus No rash  . Gabapentin Itching  . Penicillins     Positive on allergy testing 'years ago.' Has never had penicillin. Has tolerated amoxicillin.  . Codeine Itching    Current Outpatient Medications on File Prior to Visit  Medication Sig Dispense Refill  . acetaminophen (TYLENOL) 325 MG tablet Take 650 mg by mouth every 6 (six) hours as needed.    Marland Kitchen albuterol (VENTOLIN HFA) 108 (90 Base) MCG/ACT inhaler INHALE 2 PUFFS INTO THE LUNGS EVERY 6 HOURS AS NEEDED FOR WHEEZING  OR SHORTNESS OF BREATH (Patient taking differently: Inhale 2 puffs into the lungs every 6 (six) hours as needed for wheezing or shortness of breath.) 25.5 g 1  . Ascorbic Acid (VITAMIN C) 100 MG tablet Take 100 mg by mouth as needed.     Marland Kitchen azelastine (ASTELIN) 0.1 % nasal spray Place 1 spray into both nostrils 2 (two) times daily. Use in each nostril as directed 30 mL 5  . Blood Glucose Monitoring Suppl ([place]) w/Device KIT Use to check blood glucose once a day.  DX code: E11.9 1 kit 0  . budesonide (PULMICORT) 180 MCG/ACT inhaler Inhale 1-2 puffs into the lungs 2 (two) times daily. (Patient taking differently: Inhale 2 puffs into the lungs daily.) 1 each 3  . Cholecalciferol ([employer]S VIT D 5000 HIGH-POTENCY PO) Take 5,000 Units by mouth.    . fluticasone (FLOVENT HFA) 110 MCG/ACT inhaler Inhale 2 puffs into the lungs 2 (two) times daily. 1 each 5  . glucose blood (ONETOUCH VERIO) test strip Use to check blood glucose once a day.  DX code: E11.9 100 each 1  . Lancets (ONETOUCH DELICA PLUS [id number]) MISC Use to check blood glucose once a day.  DX code: E11.9 100 each 1  . loratadine (CLARITIN) 10 MG tablet Take 10 mg by mouth in the morning and at bedtime.     . montelukast (SINGULAIR) 10 MG tablet Take 1 tablet (10 mg total) by mouth daily as needed. 90 tablet 3  . Olopatadine HCl 0.2 % SOLN Apply 1 drop to eye daily as needed. 2.5 mL 5  . Omega-3 1000 MG CAPS Take 1,000 mg by mouth daily.    Marland Kitchen omeprazole (PRILOSEC) 20 MG capsule Take 20 mg by mouth daily.    . predniSONE (DELTASONE) 20 MG tablet Take 2 tablets (40 mg total) by mouth daily. 8 tablet 0  . rosuvastatin (CRESTOR) 5 MG tablet TAKE 1 TABLET BY MOUTH 2 TIMES A WEEK(TUESDAYS AND SUNDAYS WEEKLY) 25 tablet 3  . SYNTHROID 150 MCG tablet TAKE 1 TABLET(150 MCG) BY MOUTH DAILY BEFORE AND BREAKFAST 90 tablet 0  . tiZANidine (ZANAFLEX) 2 MG tablet Take 0.5-2 tablets (1-4 mg total) by mouth 2 (two) times daily as needed for muscle  spasms. 30 tablet 1  . triamterene-hydrochlorothiazide (MAXZIDE) 75-50 MG tablet Take 1 tablet by mouth daily. 90 tablet 0   No current  facility-administered medications on file prior to visit.    BP (!) 147/88 (BP Location: Right Arm, Patient Position: Sitting, Cuff Size: Large)   Pulse 87   Temp 98.5 F (36.9 C) (Oral)   Resp 16   Ht 5' 1.5" (1.562 m)   Wt 199 lb 12.8 oz (90.6 kg)   SpO2 98%   BMI 37.14 kg/m       Objective:   Physical Exam Constitutional:      Appearance: She is well-developed.  HENT:     Right Ear: Tympanic membrane and ear canal normal.     Left Ear: Tympanic membrane and ear canal normal.     Nose:     Right Sinus: No maxillary sinus tenderness or frontal sinus tenderness.     Left Sinus: No maxillary sinus tenderness or frontal sinus tenderness.  Neck:     Thyroid: No thyromegaly.  Cardiovascular:     Rate and Rhythm: Normal rate and regular rhythm.     Heart sounds: Normal heart sounds. No murmur heard.   Pulmonary:     Effort: Pulmonary effort is normal. No respiratory distress.     Breath sounds: Normal breath sounds. No wheezing.  Musculoskeletal:     Cervical back: Neck supple.  Lymphadenopathy:     Cervical: No cervical adenopathy.  Skin:    General: Skin is warm and dry.  Neurological:     Mental Status: She is alert and oriented to person, place, and time.  Psychiatric:        Behavior: Behavior normal.        Thought Content: Thought content normal.        Judgment: Judgment normal.           Assessment & Plan:  Acute asthma exacerbation- resolved following prednisone. Continue pulmicort and prn albuterol.  Sinusitis- new. She is not very tender but she is having a lot of pressure and copious amounts of green drainage. She has multiple drug allergies, but has tolerated doxycycline in the past. Will plan rx with doxy 171m po BID x 10 days. She is advised to call if symptoms worsen or if no improvement in 3 days. Pt  verbalizes understanding.  This visit occurred during the SARS-CoV-2 public health emergency.  Safety protocols were in place, including screening questions prior to the visit, additional usage of staff PPE, and extensive cleaning of exam room while observing appropriate contact time as indicated for disinfecting solutions.

## 2020-10-13 NOTE — Patient Instructions (Addendum)
Please begin doxycycline for sinus infection. Call if symptoms worsen or if you are not improved in 3 days.

## 2020-10-19 ENCOUNTER — Other Ambulatory Visit (HOSPITAL_BASED_OUTPATIENT_CLINIC_OR_DEPARTMENT_OTHER): Payer: Self-pay

## 2020-10-19 ENCOUNTER — Other Ambulatory Visit: Payer: Self-pay

## 2020-10-19 ENCOUNTER — Encounter (HOSPITAL_BASED_OUTPATIENT_CLINIC_OR_DEPARTMENT_OTHER): Payer: Self-pay

## 2020-10-19 ENCOUNTER — Emergency Department (HOSPITAL_BASED_OUTPATIENT_CLINIC_OR_DEPARTMENT_OTHER)
Admission: EM | Admit: 2020-10-19 | Discharge: 2020-10-19 | Disposition: A | Discharge status: Home / Self Care | Payer: Medicare Other | Attending: Emergency Medicine | Admitting: Emergency Medicine

## 2020-10-19 ENCOUNTER — Emergency Department (HOSPITAL_BASED_OUTPATIENT_CLINIC_OR_DEPARTMENT_OTHER): Payer: Medicare Other

## 2020-10-19 DIAGNOSIS — Z79899 Other long term (current) drug therapy: Secondary | ICD-10-CM | POA: Diagnosis not present

## 2020-10-19 DIAGNOSIS — Z20822 Contact with and (suspected) exposure to covid-19: Secondary | ICD-10-CM | POA: Diagnosis not present

## 2020-10-19 DIAGNOSIS — Z8616 Personal history of COVID-19: Secondary | ICD-10-CM | POA: Diagnosis not present

## 2020-10-19 DIAGNOSIS — Z96659 Presence of unspecified artificial knee joint: Secondary | ICD-10-CM | POA: Diagnosis not present

## 2020-10-19 DIAGNOSIS — J069 Acute upper respiratory infection, unspecified: Secondary | ICD-10-CM

## 2020-10-19 DIAGNOSIS — E119 Type 2 diabetes mellitus without complications: Secondary | ICD-10-CM | POA: Diagnosis not present

## 2020-10-19 DIAGNOSIS — R059 Cough, unspecified: Secondary | ICD-10-CM | POA: Diagnosis not present

## 2020-10-19 DIAGNOSIS — R0602 Shortness of breath: Secondary | ICD-10-CM | POA: Diagnosis not present

## 2020-10-19 DIAGNOSIS — E039 Hypothyroidism, unspecified: Secondary | ICD-10-CM | POA: Diagnosis not present

## 2020-10-19 DIAGNOSIS — J45909 Unspecified asthma, uncomplicated: Secondary | ICD-10-CM | POA: Insufficient documentation

## 2020-10-19 DIAGNOSIS — R519 Headache, unspecified: Secondary | ICD-10-CM | POA: Insufficient documentation

## 2020-10-19 DIAGNOSIS — Z85850 Personal history of malignant neoplasm of thyroid: Secondary | ICD-10-CM | POA: Diagnosis not present

## 2020-10-19 DIAGNOSIS — I10 Essential (primary) hypertension: Secondary | ICD-10-CM | POA: Diagnosis not present

## 2020-10-19 MED ORDER — METHYLPREDNISOLONE 4 MG PO TBPK: ORAL_TABLET | ORAL | 0 refills | Status: DC

## 2020-10-19 MED ORDER — METHYLPREDNISOLONE 4 MG PO TBPK
ORAL_TABLET | ORAL | 0 refills | Status: DC
  Filled 2020-10-19: qty 21, 7d supply, fill #0

## 2020-10-19 MED ORDER — AMOXICILLIN-POT CLAVULANATE 875-125 MG PO TABS
1.0000 | ORAL_TABLET | Freq: Two times a day (BID) | ORAL | 0 refills | Status: DC
  Filled 2020-10-19: qty 14, 7d supply, fill #0

## 2020-10-19 MED ORDER — BENZONATATE 100 MG PO CAPS: 100.0000 mg | ORAL_CAPSULE | Freq: Three times a day (TID) | ORAL | 0 refills | Status: DC | PRN

## 2020-10-19 MED ORDER — BENZONATATE 100 MG PO CAPS
100.0000 mg | ORAL_CAPSULE | Freq: Three times a day (TID) | ORAL | 0 refills | Status: DC | PRN
  Filled 2020-10-19: qty 20, 7d supply, fill #0

## 2020-10-19 NOTE — ED Provider Notes (Signed)
[facility] EMERGENCY DEPARTMENT Provider Note   CSN: [id number] Arrival date & time: 10/19/20  1142     History Chief Complaint  Patient presents with  . Cough    Sinus congestion    Monica Wallace is a 70 y.o. female who presents emergency department chief complaint of URI symptoms.  Patient has had URI sxs including sinus pressure, cough, wheezing.  Patient was seen and placed on a prednisone taper.  Symptoms began improving.  She saw her primary care doctor who started her on doxycycline.  She states that the prednisone ended and symptoms got a lot worse.  She began having daily headaches, sinus pain wheezing worsened.  Over the weekend she stayed in bed because she was having fevers up to 101.7, body aches, chills and wheezing.  She decided to be reevaluated today.  She has been compliant with taking her doxycycline.  She denies any severe shortness of breath.  She states "I just feel terrible."  HPI     Past Medical History:  Diagnosis Date  . Arthritis   . Asthma   . Baker's cyst of knee, right 02/16/2017  . Cancer ([place])    thyroid (2002)  . Cystic breast   . Diabetes ([place])   . GERD (gastroesophageal reflux disease)   . Hepatic cyst 02/02/2015  . Hyperlipidemia   . Hypertension   . Insomnia 07/23/2016  . Menopause   . Migraine   . Palpitations   . Preventative health care 01/18/2016  . Sleep apnea 02/02/2015  . Thyroid disease     Patient Active Problem List   Diagnosis Date Noted  . Allergic conjunctivitis 04/08/2020  . Neck pain 02/06/2020  . Acute bronchitis 01/13/2020  . Otitis media 09/30/2019  . Hip pain, right 09/30/2019  . COVID-19 09/30/2019  . Educated about COVID-19 virus infection 12/05/2018  . High risk heterosexual behavior 11/20/2017  . Baker's cyst of knee, right 02/16/2017  . Insomnia 07/23/2016  . Hypokalemia 07/23/2016  . Controlled type 2 diabetes mellitus without complication, without long-term current use of insulin ([place]) 01/24/2016   . Skin lesion of right leg 01/24/2016  . Preventative health care 01/18/2016  . Left-sided low back pain with left-sided sciatica 10/07/2015  . Obesity (BMI 30-39.9) 06/05/2015  . OSA (obstructive sleep apnea) 02/02/2015  . Palpitations 09/19/2014  . Plantar fasciitis of right foot 11/19/2013  . Tenosynovitis of foot and ankle 11/19/2013  . Pronation deformity of ankle, acquired 11/19/2013  . DJD (degenerative joint disease) 08/26/2013  . Fibrocystic breast 08/26/2013  . HTN (hypertension) 08/26/2013  . GERD (gastroesophageal reflux disease) 08/26/2013  . S/P hysterectomy with oophorectomy 08/26/2013  . Hypothyroidism 08/26/2013  . Chronic hoarseness 08/26/2013  . Colon polyp 08/26/2013  . Hiatal hernia 08/26/2013  . Irritable bowel syndrome 08/26/2013  . Moderate persistent asthma 08/26/2013  . Allergic rhinoconjunctivitis 08/26/2013  . Hyperlipidemia 08/22/2013  . Migraine 08/22/2013  . Thyroid cancer ([place]) 08/22/2013    Past Surgical History:  Procedure Laterality Date  . ABDOMINAL HYSTERECTOMY     took right ovary and uterus  . APPENDECTOMY    . CARPAL TUNNEL RELEASE Right   . CESAREAN SECTION    . CHOLECYSTECTOMY    . EYE SURGERY Bilateral 2017   cataracts  . KNEE ARTHROPLASTY    . THYROIDECTOMY  2002     OB History    Gravida  2   Para      Term      Preterm  AB      Living  2     SAB      IAB      Ectopic      Multiple      Live Births              Family History  Problem Relation Age of Onset  . Arthritis Mother   . Transient ischemic attack Mother   . Hypertension Mother   . Hyperlipidemia Mother   . Diabetes Father   . Heart disease Father   . Arthritis Father   . Kidney disease Father   . Hypertension Father   . Hyperlipidemia Father   . Cancer Sister 34       pancreatic  . Fibromyalgia Sister   . Allergic rhinitis Sister   . Asthma Maternal Grandfather        smoker  . Depression Maternal Grandfather   .  Dementia Paternal Grandmother   . Diabetes Paternal Grandfather   . Hypertension Brother   . Allergic rhinitis Brother   . Scoliosis Daughter        had rods placed and removed  . Arthritis Sister   . Gout Sister   . GER disease Sister   . Cancer Sister        breast cancer  . Breast cancer Sister 51    Social History   Tobacco Use  . Smoking status: Never Smoker  . Smokeless tobacco: Never Used  Vaping Use  . Vaping Use: Never used  Substance Use Topics  . Alcohol use: Yes    Alcohol/week: 0.0 standard drinks    Comment: very rare once or twice a year  . Drug use: No    Home Medications Prior to Admission medications   Medication Sig Start Date End Date Taking? Authorizing Provider  amoxicillin-clavulanate (AUGMENTIN) 875-125 MG tablet Take 1 tablet by mouth 2 (two) times daily for 7 days 10/19/20  Yes Margarita Mail, PA-C  acetaminophen (TYLENOL) 325 MG tablet Take 650 mg by mouth every 6 (six) hours as needed.    [provider]  albuterol (VENTOLIN HFA) 108 (90 Base) MCG/ACT inhaler INHALE 2 PUFFS INTO THE LUNGS EVERY 6 HOURS AS NEEDED FOR WHEEZING OR SHORTNESS OF BREATH Patient taking differently: Inhale 2 puffs into the lungs every 6 (six) hours as needed for wheezing or shortness of breath. 10/01/19   [name], MD  Ascorbic Acid (VITAMIN C) 100 MG tablet Take 100 mg by mouth as needed.     [provider]  azelastine (ASTELIN) 0.1 % nasal spray Place 1 spray into both nostrils 2 (two) times daily. Use in each nostril as directed 04/08/20   Bobbitt, [place] Muta, MD  benzonatate (TESSALON PERLES) 100 MG capsule Take 1 capsule (100 mg total) by mouth 3 (three) times daily as needed for cough. 10/19/20   Margarita Mail, PA-C  Blood Glucose Monitoring Suppl ([place]) w/Device KIT Use to check blood glucose once a day.  DX code: E11.9 02/06/20   [name], MD  budesonide (PULMICORT) 180 MCG/ACT inhaler Inhale 1-2 puffs into the  lungs 2 (two) times daily. Patient taking differently: Inhale 2 puffs into the lungs daily. 01/23/20   Copland, Gay Filler, MD  Cholecalciferol ([employer] VIT D 5000 HIGH-POTENCY PO) Take 5,000 Units by mouth.    [provider]  glucose blood (ONETOUCH VERIO) test strip Use to check blood glucose once a day.  DX code: E11.9 02/06/20  [name], MD  Lancets [facility] DELICA PLUS [id number]) MISC Use to check blood glucose once a day.  DX code: E11.9 02/06/20   [name], MD  loratadine (CLARITIN) 10 MG tablet Take 10 mg by mouth in the morning and at bedtime.     [provider]  methylPREDNISolone (MEDROL DOSEPAK) 4 MG TBPK tablet Use as directed 10/19/20   Margarita Mail, PA-C  montelukast (SINGULAIR) 10 MG tablet Take 1 tablet (10 mg total) by mouth daily as needed. 11/04/19   [name], MD  Olopatadine HCl 0.2 % SOLN Apply 1 drop to eye daily as needed. 04/08/20   Bobbitt, [place] Muta, MD  Omega-3 1000 MG CAPS Take 1,000 mg by mouth daily.    [provider]  omeprazole (PRILOSEC) 20 MG capsule Take 20 mg by mouth daily.    [provider]  predniSONE (DELTASONE) 20 MG tablet Take 2 tablets (40 mg total) by mouth daily. 10/09/20   Sherwood Gambler, MD  predniSONE (DELTASONE) 20 MG tablet TAKE TWO TABLETS BY MOUTH DAILY 10/09/20 10/09/21  Sherwood Gambler, MD  rosuvastatin (CRESTOR) 5 MG tablet TAKE 1 TABLET BY MOUTH 2 TIMES A WEEK(TUESDAYS AND SUNDAYS WEEKLY) 05/18/20   [name], MD  SYNTHROID 150 MCG tablet TAKE 1 TABLET(150 MCG) BY MOUTH DAILY BEFORE AND BREAKFAST 06/29/20   [name], MD  tiZANidine (ZANAFLEX) 2 MG tablet Take 0.5-2 tablets (1-4 mg total) by mouth 2 (two) times daily as needed for muscle spasms. 02/06/20   [name], MD  triamterene-hydrochlorothiazide (MAXZIDE) 75-50 MG tablet Take 1 tablet by mouth daily. 09/23/20   [name], MD    Allergies    Levaquin [levofloxacin], Clindamycin/lincomycin, Gabapentin,  Penicillins, and Codeine  Review of Systems   Review of Systems Ten systems reviewed and are negative for acute change, except as noted in the HPI.   Physical Exam Updated Vital Signs BP 119/70 (BP Location: Right Arm)   Pulse (!) 104   Temp 100.3 F (37.9 C) (Oral)   Resp 20   Ht _0  (1.575 m)   Wt 87.1 kg   SpO2 96%   BMI 35.12 kg/m   Physical Exam Vitals and nursing note reviewed.  Constitutional:      General: She is not in acute distress.    Appearance: She is well-developed. She is ill-appearing and toxic-appearing. She is not diaphoretic.  HENT:     Head: Normocephalic and atraumatic.  Eyes:     General: No scleral icterus.    Conjunctiva/sclera: Conjunctivae normal.  Cardiovascular:     Rate and Rhythm: Normal rate and regular rhythm.     Heart sounds: Normal heart sounds. No murmur heard. No friction rub. No gallop.   Pulmonary:     Effort: Pulmonary effort is normal. No respiratory distress.     Breath sounds: Wheezing and rhonchi present.  Abdominal:     General: Bowel sounds are normal. There is no distension.     Palpations: Abdomen is soft. There is no mass.     Tenderness: There is no abdominal tenderness. There is no guarding.  Musculoskeletal:     Cervical back: Normal range of motion.  Skin:    General: Skin is warm and dry.  Neurological:     Mental Status: She is alert and oriented to person, place, and time.  Psychiatric:        Behavior: Behavior normal.     ED Results / Procedures /  Treatments   Labs (all labs ordered are listed, but only abnormal results are displayed) Labs Reviewed  SARS CORONAVIRUS 2 (TAT 6-24 HRS)    EKG None  Radiology DG Chest Port 1 View  Result Date: 10/19/2020 CLINICAL DATA:  Productive cough, shortness of breath EXAM: PORTABLE CHEST 1 VIEW COMPARISON:  Portable exam 1406 hours compared to 10/09/2020 FINDINGS: Normal heart size, mediastinal contours, and pulmonary vascularity. Atherosclerotic  calcification aorta. Lungs clear. No pulmonary infiltrate, pleural effusion, or pneumothorax. Osseous structures unremarkable. IMPRESSION: No acute abnormalities. Aortic Atherosclerosis (ICD10-I70.0). Electronically Signed   By: Lavonia Dana M.D.   On: 10/19/2020 14:46    Procedures Procedures   Medications Ordered in ED Medications - No data to display  ED Course  I have reviewed the triage vital signs and the nursing notes.  Pertinent labs & imaging results that were available during my care of the patient were reviewed by me and considered in my medical decision making (see chart for details).    MDM Rules/Calculators/A&P                         Patient here with URI symptoms.  She has been on antibiotics.  She has worsening symptoms.  No fevers.  I ordered and reviewed a chest x-ray which shows no acute abnormalities.  Patient's Covid/influenza test is currently pending.  Patient has been on doxycycline which has good coverage for atypicals will broaden coverage with Augmentin as her chief complaint is currently her sinus symptoms..  Patient states that she has taken penicillin the past without any kind of abnormal reactions.  Patient will also receive Tessalon Perles and another Medrol Dosepak for her wheezing.  She continue using her inhaler and follow closely with her primary care physician.  Patient may also follow-up on her COVID results. Monica Wallace was evaluated in Emergency Department on 10/19/2020 for the symptoms described in the history of present illness. She was evaluated in the context of the global COVID-19 pandemic, which necessitated consideration that the patient might be at risk for infection with the SARS-CoV-2 virus that causes COVID-19. Institutional protocols and algorithms that pertain to the evaluation of patients at risk for COVID-19 are in a state of rapid change based on information released by regulatory bodies including the CDC and federal and state organizations.  These policies and algorithms were followed during the patient's care in the ED.  Marland Kitchen Final Clinical Impression(s) / ED Diagnoses Final diagnoses:  Upper respiratory tract infection, unspecified type    Rx / DC Orders ED Discharge Orders         Ordered    methylPREDNISolone (MEDROL DOSEPAK) 4 MG TBPK tablet  Status:  Discontinued        10/19/20 1550    benzonatate (TESSALON PERLES) 100 MG capsule  3 times daily PRN,   Status:  Discontinued        10/19/20 1550    benzonatate (TESSALON PERLES) 100 MG capsule  3 times daily PRN,   Status:  Discontinued        10/19/20 1550    methylPREDNISolone (MEDROL DOSEPAK) 4 MG TBPK tablet        10/19/20 1554    benzonatate (TESSALON PERLES) 100 MG capsule  3 times daily PRN        10/19/20 1554    amoxicillin-clavulanate (AUGMENTIN) 875-125 MG tablet  2 times daily        10/19/20 1554  Margarita Mail, PA-C 10/19/20 1602    Quintella Reichert, MD 10/21/20 [phone]

## 2020-10-19 NOTE — Discharge Instructions (Addendum)
You may follow-up on your Covid test at home.  Get help right away if: You have shortness of breath that gets worse. You have severe or persistent: Headache. Ear pain. Sinus pain. Chest pain. You have chronic lung disease along with any of the following: Wheezing. Prolonged cough. Coughing up blood. A change in your usual mucus. You have a stiff neck. You have changes in your: Vision. Hearing. Thinking. Mood.

## 2020-10-19 NOTE — ED Triage Notes (Signed)
Pt states cold symptoms for one week, not improving, taking doxycycline for sinus infection, greenish drainage.  Took tylenol 3am.  Mild nausea

## 2020-10-20 LAB — SARS CORONAVIRUS 2 (TAT 6-24 HRS): SARS Coronavirus 2: NEGATIVE

## 2020-10-29 ENCOUNTER — Ambulatory Visit (INDEPENDENT_AMBULATORY_CARE_PROVIDER_SITE_OTHER): Payer: Medicare Other | Admitting: Family Medicine

## 2020-10-29 ENCOUNTER — Other Ambulatory Visit (HOSPITAL_BASED_OUTPATIENT_CLINIC_OR_DEPARTMENT_OTHER): Payer: Self-pay

## 2020-10-29 ENCOUNTER — Encounter: Payer: Self-pay | Admitting: Family Medicine

## 2020-10-29 ENCOUNTER — Other Ambulatory Visit: Payer: Self-pay

## 2020-10-29 VITALS — BP 116/62 | HR 94 | Temp 98.1°F | Resp 16 | Wt 195.2 lb

## 2020-10-29 DIAGNOSIS — E782 Mixed hyperlipidemia: Secondary | ICD-10-CM | POA: Diagnosis not present

## 2020-10-29 DIAGNOSIS — I10 Essential (primary) hypertension: Secondary | ICD-10-CM | POA: Diagnosis not present

## 2020-10-29 DIAGNOSIS — E890 Postprocedural hypothyroidism: Secondary | ICD-10-CM | POA: Diagnosis not present

## 2020-10-29 DIAGNOSIS — E119 Type 2 diabetes mellitus without complications: Secondary | ICD-10-CM

## 2020-10-29 DIAGNOSIS — N760 Acute vaginitis: Secondary | ICD-10-CM | POA: Diagnosis not present

## 2020-10-29 DIAGNOSIS — J329 Chronic sinusitis, unspecified: Secondary | ICD-10-CM | POA: Diagnosis not present

## 2020-10-29 DIAGNOSIS — M47812 Spondylosis without myelopathy or radiculopathy, cervical region: Secondary | ICD-10-CM | POA: Diagnosis not present

## 2020-10-29 DIAGNOSIS — M5030 Other cervical disc degeneration, unspecified cervical region: Secondary | ICD-10-CM | POA: Diagnosis not present

## 2020-10-29 DIAGNOSIS — C73 Malignant neoplasm of thyroid gland: Secondary | ICD-10-CM | POA: Diagnosis not present

## 2020-10-29 LAB — CBC
HCT: 40.5 % (ref 36.0–46.0)
Hemoglobin: 13.4 g/dL (ref 12.0–15.0)
MCHC: 33 g/dL (ref 30.0–36.0)
MCV: 89.4 fl (ref 78.0–100.0)
Platelets: 304 10*3/uL (ref 150.0–400.0)
RBC: 4.53 Mil/uL (ref 3.87–5.11)
RDW: 13.2 % (ref 11.5–15.5)
WBC: 12.2 10*3/uL — ABNORMAL HIGH (ref 4.0–10.5)

## 2020-10-29 LAB — COMPREHENSIVE METABOLIC PANEL
ALT: 30 U/L (ref 0–35)
AST: 22 U/L (ref 0–37)
Albumin: 3.7 g/dL (ref 3.5–5.2)
Alkaline Phosphatase: 69 U/L (ref 39–117)
BUN: 13 mg/dL (ref 6–23)
CO2: 32 mEq/L (ref 19–32)
Calcium: 9.3 mg/dL (ref 8.4–10.5)
Chloride: 99 mEq/L (ref 96–112)
Creatinine, Ser: 0.8 mg/dL (ref 0.40–1.20)
GFR: 73.99 mL/min (ref >60.00)
Glucose, Bld: 101 mg/dL — ABNORMAL HIGH (ref 70–99)
Potassium: 3.5 mEq/L (ref 3.5–5.1)
Sodium: 139 mEq/L (ref 135–145)
Total Bilirubin: 0.5 mg/dL (ref 0.2–1.2)
Total Protein: 6.7 g/dL (ref 6.0–8.3)

## 2020-10-29 LAB — LIPID PANEL
Cholesterol: 156 mg/dL (ref 0–200)
HDL: 49.2 mg/dL (ref >39.00)
NonHDL: 106.64
Total CHOL/HDL Ratio: 3
Triglycerides: 206 mg/dL — ABNORMAL HIGH (ref 0.0–149.0)
VLDL: 41.2 mg/dL — ABNORMAL HIGH (ref 0.0–40.0)

## 2020-10-29 LAB — LDL CHOLESTEROL, DIRECT: Direct LDL: 86 mg/dL

## 2020-10-29 LAB — HEMOGLOBIN A1C: Hgb A1c MFr Bld: 6.7 % — ABNORMAL HIGH (ref 4.6–6.5)

## 2020-10-29 LAB — TSH: TSH: 0.24 u[IU]/mL — ABNORMAL LOW (ref 0.35–4.50)

## 2020-10-29 MED ORDER — BENZONATATE 100 MG PO CAPS
100.0000 mg | ORAL_CAPSULE | Freq: Three times a day (TID) | ORAL | 1 refills | Status: DC | PRN
  Filled 2020-10-29: qty 60, 10d supply, fill #0

## 2020-10-29 MED ORDER — FLUTICASONE PROPIONATE 50 MCG/ACT NA SUSP: 2.0000 | Freq: Every day | NASAL | 6 refills | Status: DC

## 2020-10-29 MED ORDER — CEFDINIR 300 MG PO CAPS
300.0000 mg | ORAL_CAPSULE | Freq: Two times a day (BID) | ORAL | 0 refills | Status: AC
  Filled 2020-10-29: qty 20, 10d supply, fill #0

## 2020-10-29 MED ORDER — FLUCONAZOLE 150 MG PO TABS
150.0000 mg | ORAL_TABLET | ORAL | 1 refills | Status: DC
Start: 2020-10-29 — End: 2021-06-08
  Filled 2020-10-29: qty 2, 14d supply, fill #0

## 2020-10-29 NOTE — Progress Notes (Signed)
Patient ID: Monica Wallace, female    DOB: 1950/09/16  Age: 70 y.o. MRN: [id number]    Subjective:  Subjective  HPI Monica Wallace presents for office visit today. Monica Wallace states that Monica Wallace was negative for asthma when Monica Wallace went to the ED for coughing and sinus congestion, but Monica Wallace was dx with an upper respiratory tract infection. Monica Wallace states that the sinus infection was bad to the point that Monica Wallace could not get out of bed. Monica Wallace states that Monica Wallace has took Doxycycline, but it did not improve her symptoms. Monica Wallace states that Monica Wallace still has green discharge. Monica Wallace states that the medication Benzonatate 100 mg 3x daily prn has improved her symptoms. Monica Wallace denies any chest pain, SOB, abdominal pain, chills, sore throat, dysuria, urinary incontinence, back pain, HA, or N/V. Monica Wallace reports that Monica Wallace has a yeast infection. Monica Wallace reports taking her glucose readings every now and then and her numbers are within normal levels.   Review of Systems  Constitutional: Positive for fever (low grade). Negative for chills and fatigue.  HENT: Positive for congestion, rhinorrhea and sinus pressure. Negative for sore throat.   Eyes: Negative for pain.  Respiratory: Positive for cough. Negative for chest tightness and shortness of breath.   Cardiovascular: Negative for chest pain, palpitations and leg swelling.  Gastrointestinal: Positive for diarrhea (but improving). Negative for abdominal pain, blood in stool, nausea and vomiting.  Genitourinary: Negative for decreased urine volume, flank pain, frequency, vaginal bleeding and vaginal discharge.  Musculoskeletal: Positive for neck pain. Negative for back pain.       (+) muscle spasm due to excess coughing  Neurological: Negative for headaches.    History Past Medical History:  Diagnosis Date  . Arthritis   . Asthma   . Baker's cyst of knee, right 02/16/2017  . Cancer ([place])    thyroid (2002)  . Cystic breast   . Diabetes ([place])   . GERD (gastroesophageal reflux disease)   . Hepatic cyst  02/02/2015  . Hyperlipidemia   . Hypertension   . Insomnia 07/23/2016  . Menopause   . Migraine   . Palpitations   . Preventative health care 01/18/2016  . Sleep apnea 02/02/2015  . Thyroid disease     Monica Wallace has a past surgical history that includes Cholecystectomy; Appendectomy; Cesarean section; Knee Arthroplasty; Carpal tunnel release (Right); Abdominal hysterectomy; Eye surgery (Bilateral, 2017); and Thyroidectomy (2002).   Her family history includes Allergic rhinitis in her brother and sister; Arthritis in her father, mother, and sister; Asthma in her maternal grandfather; Breast cancer (age of onset: 32) in her sister; Cancer in her sister; Cancer (age of onset: 46) in her sister; Dementia in her paternal grandmother; Depression in her maternal grandfather; Diabetes in her father and paternal grandfather; Fibromyalgia in her sister; GER disease in her sister; Gout in her sister; Heart disease in her father; Hyperlipidemia in her father and mother; Hypertension in her brother, father, and mother; Kidney disease in her father; Scoliosis in her daughter; Transient ischemic attack in her mother.Monica Wallace reports that Monica Wallace has never smoked. Monica Wallace has never used smokeless tobacco. Monica Wallace reports current alcohol use. Monica Wallace reports that Monica Wallace does not use drugs.  Current Outpatient Medications on File Prior to Visit  Medication Sig Dispense Refill  . acetaminophen (TYLENOL) 325 MG tablet Take 650 mg by mouth every 6 (six) hours as needed.    Marland Kitchen albuterol (VENTOLIN HFA) 108 (90 Base) MCG/ACT inhaler INHALE 2 PUFFS INTO THE LUNGS EVERY 6 HOURS AS NEEDED FOR WHEEZING OR  SHORTNESS OF BREATH (Patient taking differently: Inhale 2 puffs into the lungs every 6 (six) hours as needed for wheezing or shortness of breath.) 25.5 g 1  . Ascorbic Acid (VITAMIN C) 100 MG tablet Take 100 mg by mouth as needed.     Marland Kitchen azelastine (ASTELIN) 0.1 % nasal spray Place 1 spray into both nostrils 2 (two) times daily. Use in each nostril as  directed 30 mL 5  . Blood Glucose Monitoring Suppl ([place]) w/Device KIT Use to check blood glucose once a day.  DX code: E11.9 1 kit 0  . budesonide (PULMICORT) 180 MCG/ACT inhaler Inhale 1-2 puffs into the lungs 2 (two) times daily. (Patient taking differently: Inhale 2 puffs into the lungs daily.) 1 each 3  . Cholecalciferol ([employer] VIT D 5000 HIGH-POTENCY PO) Take 5,000 Units by mouth.    Marland Kitchen glucose blood (ONETOUCH VERIO) test strip Use to check blood glucose once a day.  DX code: E11.9 100 each 1  . Lancets (ONETOUCH DELICA PLUS [id number]) MISC Use to check blood glucose once a day.  DX code: E11.9 100 each 1  . loratadine (CLARITIN) 10 MG tablet Take 10 mg by mouth in the morning and at bedtime.     . montelukast (SINGULAIR) 10 MG tablet Take 1 tablet (10 mg total) by mouth daily as needed. 90 tablet 3  . Olopatadine HCl 0.2 % SOLN Apply 1 drop to eye daily as needed. 2.5 mL 5  . Omega-3 1000 MG CAPS Take 1,000 mg by mouth daily.    Marland Kitchen omeprazole (PRILOSEC) 20 MG capsule Take 20 mg by mouth daily.    . rosuvastatin (CRESTOR) 5 MG tablet TAKE 1 TABLET BY MOUTH 2 TIMES A WEEK(TUESDAYS AND SUNDAYS WEEKLY) 25 tablet 3  . SYNTHROID 150 MCG tablet TAKE 1 TABLET(150 MCG) BY MOUTH DAILY BEFORE AND BREAKFAST 90 tablet 0  . tiZANidine (ZANAFLEX) 2 MG tablet Take 0.5-2 tablets (1-4 mg total) by mouth 2 (two) times daily as needed for muscle spasms. 30 tablet 1  . triamterene-hydrochlorothiazide (MAXZIDE) 75-50 MG tablet Take 1 tablet by mouth daily. 90 tablet 0   No current facility-administered medications on file prior to visit.     Objective:  Objective  Physical Exam Constitutional:      General: Monica Wallace is not in acute distress.    Appearance: Normal appearance. Monica Wallace is not ill-appearing or toxic-appearing.  HENT:     Head: Normocephalic and atraumatic.     Right Ear: Tympanic membrane, ear canal and external ear normal.     Left Ear: Tympanic membrane, ear canal and external  ear normal.     Nose: No congestion or rhinorrhea.  Eyes:     Extraocular Movements: Extraocular movements intact.     Pupils: Pupils are equal, round, and reactive to light.  Cardiovascular:     Rate and Rhythm: Normal rate and regular rhythm.     Pulses: Normal pulses.     Heart sounds: Normal heart sounds. No murmur heard.   Pulmonary:     Effort: Pulmonary effort is normal. No respiratory distress.     Breath sounds: Normal breath sounds. No wheezing, rhonchi or rales.  Abdominal:     General: Bowel sounds are normal.     Palpations: Abdomen is soft. There is no mass.     Tenderness: There is no abdominal tenderness. There is no guarding.     Hernia: No hernia is present.  Musculoskeletal:  General: Normal range of motion.     Cervical back: Normal range of motion and neck supple.  Skin:    General: Skin is warm and dry.  Neurological:     Mental Status: Monica Wallace is alert and oriented to person, place, and time.  Psychiatric:        Behavior: Behavior normal.    BP 116/62   Pulse 94   Temp 98.1 F (36.7 C)   Resp 16   Wt 195 lb 3.2 oz (88.5 kg)   SpO2 98%   BMI 35.70 kg/m  Wt Readings from Last 3 Encounters:  10/29/20 195 lb 3.2 oz (88.5 kg)  10/19/20 192 lb (87.1 kg)  10/13/20 199 lb 12.8 oz (90.6 kg)     Lab Results  Component Value Date   WBC 12.2 (H) 10/29/2020   HGB 13.4 10/29/2020   HCT 40.5 10/29/2020   PLT 304.0 10/29/2020   GLUCOSE 101 (H) 10/29/2020   CHOL 156 10/29/2020   TRIG 206.0 (H) 10/29/2020   HDL 49.20 10/29/2020   LDLDIRECT 86.0 10/29/2020   LDLCALC 119 (H) 06/05/2018   ALT 30 10/29/2020   AST 22 10/29/2020   NA 139 10/29/2020   K 3.5 10/29/2020   CL 99 10/29/2020   CREATININE 0.80 10/29/2020   BUN 13 10/29/2020   CO2 32 10/29/2020   TSH 0.24 (L) 10/29/2020   HGBA1C 6.7 (H) 10/29/2020    DG Chest Port 1 View  Result Date: 10/19/2020 CLINICAL DATA:  Productive cough, shortness of breath EXAM: PORTABLE CHEST 1 VIEW  COMPARISON:  Portable exam 1406 hours compared to 10/09/2020 FINDINGS: Normal heart size, mediastinal contours, and pulmonary vascularity. Atherosclerotic calcification aorta. Lungs clear. No pulmonary infiltrate, pleural effusion, or pneumothorax. Osseous structures unremarkable. IMPRESSION: No acute abnormalities. Aortic Atherosclerosis (ICD10-I70.0). Electronically Signed   By: Lavonia Dana M.D.   On: 10/19/2020 14:46     Assessment & Plan:  Plan    Meds ordered this encounter  Medications  . fluticasone (FLONASE) 50 MCG/ACT nasal spray    Sig: Place 2 sprays into both nostrils daily.    Dispense:  16 g    Refill:  6  . fluconazole (DIFLUCAN) 150 MG tablet    Sig: Take 1 tablet (150 mg total) by mouth once a week.    Dispense:  2 tablet    Refill:  1  . cefdinir (OMNICEF) 300 MG capsule    Sig: Take 1 capsule (300 mg total) by mouth 2 (two) times daily for 10 days.    Dispense:  20 capsule    Refill:  0  . benzonatate (TESSALON PERLES) 100 MG capsule    Sig: Take 1-2 capsules (100-200 mg total) by mouth 3 (three) times daily as needed for cough.    Dispense:  60 capsule    Refill:  1    Problem List Items Addressed This Visit    Hyperlipidemia - Primary    Tolerating statin, encouraged heart healthy diet, avoid trans fats, minimize simple carbs and saturated fats. Increase exercise as tolerated      Relevant Orders   Lipid panel (Completed)   HTN (hypertension)    Well controlled, no changes to meds. Encouraged heart healthy diet such as the DASH diet and exercise as tolerated.       Relevant Orders   CBC (Completed)   Comprehensive metabolic panel (Completed)   TSH (Completed)   Hypothyroidism    On Levothyroxine, continue to monitor  Controlled type 2 diabetes mellitus without complication, without long-term current use of insulin (HCC)    hgba1c acceptable, minimize simple carbs. Increase exercise as tolerated. Continue current meds      Relevant Orders    Hemoglobin A1c (Completed)   Vaginitis    After antibiotic use. Given a prescription for Diflucan to use prn, minimize simple carbs.       Sinusitis    Monica Wallace had been improving but does note Monica Wallace is slightly worse since stopping her antibiotics. Monica Wallace is given a refill on Augmentin if her symptoms continue to worsen. Encouraged increased rest and hydration, add probiotics, zinc such as Coldeze or Xicam. Treat fevers as needed      Relevant Medications   fluticasone (FLONASE) 50 MCG/ACT nasal spray   fluconazole (DIFLUCAN) 150 MG tablet   cefdinir (OMNICEF) 300 MG capsule   benzonatate (TESSALON PERLES) 100 MG capsule      Follow-up: No follow-ups on file.   I,David Hanna,acting as a scribe for [name], MD.,have documented all relevant documentation on the behalf of [name], MD,as directed by  [name], MD while in the presence of [name], MD.  I, [name], MD personally performed the services described in this documentation. All medical record entries made by the scribe were at my direction and in my presence. I have reviewed the chart and agree that the record reflects my personal performance and is accurate and complete

## 2020-10-29 NOTE — Patient Instructions (Signed)
Sinusitis, Adult Sinusitis is inflammation of your sinuses. Sinuses are hollow spaces in the bones around your face. Your sinuses are located:  Around your eyes.  In the middle of your forehead.  Behind your nose.  In your cheekbones. Mucus normally drains out of your sinuses. When your nasal tissues become inflamed or swollen, mucus can become trapped or blocked. This allows bacteria, viruses, and fungi to grow, which leads to infection. Most infections of the sinuses are caused by a virus. Sinusitis can develop quickly. It can last for up to 4 weeks (acute) or for more than 12 weeks (chronic). Sinusitis often develops after a cold. What are the causes? This condition is caused by anything that creates swelling in the sinuses or stops mucus from draining. This includes:  Allergies.  Asthma.  Infection from bacteria or viruses.  Deformities or blockages in your nose or sinuses.  Abnormal growths in the nose (nasal polyps).  Pollutants, such as chemicals or irritants in the air.  Infection from fungi (rare). What increases the risk? You are more likely to develop this condition if you:  Have a weak body defense system (immune system).  Do a lot of swimming or diving.  Overuse nasal sprays.  Smoke. What are the signs or symptoms? The main symptoms of this condition are pain and a feeling of pressure around the affected sinuses. Other symptoms include:  Stuffy nose or congestion.  Thick drainage from your nose.  Swelling and warmth over the affected sinuses.  Headache.  Upper toothache.  A cough that may get worse at night.  Extra mucus that collects in the throat or the back of the nose (postnasal drip).  Decreased sense of smell and taste.  Fatigue.  A fever.  Sore throat.  Bad breath. How is this diagnosed? This condition is diagnosed based on:  Your symptoms.  Your medical history.  A physical exam.  Tests to find out if your condition is  acute or chronic. This may include: ? Checking your nose for nasal polyps. ? Viewing your sinuses using a device that has a light (endoscope). ? Testing for allergies or bacteria. ? Imaging tests, such as an MRI or CT scan. In rare cases, a bone biopsy may be done to rule out more serious types of fungal sinus disease. How is this treated? Treatment for sinusitis depends on the cause and whether your condition is chronic or acute.  If caused by a virus, your symptoms should go away on their own within 10 days. You may be given medicines to relieve symptoms. They include: ? Medicines that shrink swollen nasal passages (topical intranasal decongestants). ? Medicines that treat allergies (antihistamines). ? A spray that eases inflammation of the nostrils (topical intranasal corticosteroids). ? Rinses that help get rid of thick mucus in your nose (nasal saline washes).  If caused by bacteria, your health care provider may recommend waiting to see if your symptoms improve. Most bacterial infections will get better without antibiotic medicine. You may be given antibiotics if you have: ? A severe infection. ? A weak immune system.  If caused by narrow nasal passages or nasal polyps, you may need to have surgery. Follow these instructions at home: Medicines  Take, use, or apply over-the-counter and prescription medicines only as told by your health care provider. These may include nasal sprays.  If you were prescribed an antibiotic medicine, take it as told by your health care provider. Do not stop taking the antibiotic even if you start   to feel better. Hydrate and humidify  Drink enough fluid to keep your urine pale yellow. Staying hydrated will help to thin your mucus.  Use a cool mist humidifier to keep the humidity level in your home above 50%.  Inhale steam for 10-15 minutes, 3-4 times a day, or as told by your health care provider. You can do this in the bathroom while a hot shower is  running.  Limit your exposure to cool or dry air.   Rest  Rest as much as possible.  Sleep with your head raised (elevated).  Make sure you get enough sleep each night. General instructions  Apply a warm, moist washcloth to your face 3-4 times a day or as told by your health care provider. This will help with discomfort.  Wash your hands often with soap and water to reduce your exposure to germs. If soap and water are not available, use hand sanitizer.  Do not smoke. Avoid being around people who are smoking (secondhand smoke).  Keep all follow-up visits as told by your health care provider. This is important.   Contact a health care provider if:  You have a fever.  Your symptoms get worse.  Your symptoms do not improve within 10 days. Get help right away if:  You have a severe headache.  You have persistent vomiting.  You have severe pain or swelling around your face or eyes.  You have vision problems.  You develop confusion.  Your neck is stiff.  You have trouble breathing. Summary  Sinusitis is soreness and inflammation of your sinuses. Sinuses are hollow spaces in the bones around your face.  This condition is caused by nasal tissues that become inflamed or swollen. The swelling traps or blocks the flow of mucus. This allows bacteria, viruses, and fungi to grow, which leads to infection.  If you were prescribed an antibiotic medicine, take it as told by your health care provider. Do not stop taking the antibiotic even if you start to feel better.  Keep all follow-up visits as told by your health care provider. This is important. This information is not intended to replace advice given to you by your health care provider. Make sure you discuss any questions you have with your health care provider. Document Revised: 12/04/2017 Document Reviewed: 12/04/2017 Elsevier Patient Education  2021 [employer]  

## 2020-10-30 DIAGNOSIS — N760 Acute vaginitis: Secondary | ICD-10-CM | POA: Insufficient documentation

## 2020-10-30 DIAGNOSIS — J329 Chronic sinusitis, unspecified: Secondary | ICD-10-CM | POA: Insufficient documentation

## 2020-10-30 NOTE — Assessment & Plan Note (Signed)
Tolerating statin, encouraged heart healthy diet, avoid trans fats, minimize simple carbs and saturated fats. Increase exercise as tolerated 

## 2020-10-30 NOTE — Assessment & Plan Note (Signed)
Well controlled, no changes to meds. Encouraged heart healthy diet such as the DASH diet and exercise as tolerated.  °

## 2020-10-30 NOTE — Assessment & Plan Note (Signed)
hgba1c acceptable, minimize simple carbs. Increase exercise as tolerated. Continue current meds 

## 2020-10-30 NOTE — Assessment & Plan Note (Signed)
After antibiotic use. Given a prescription for Diflucan to use prn, minimize simple carbs.

## 2020-10-30 NOTE — Assessment & Plan Note (Signed)
She had been improving but does note she is slightly worse since stopping her antibiotics. She is given a refill on Augmentin if her symptoms continue to worsen. Encouraged increased rest and hydration, add probiotics, zinc such as Coldeze or Xicam. Treat fevers as needed

## 2020-10-30 NOTE — Assessment & Plan Note (Signed)
On Levothyroxine, continue to monitor 

## 2020-11-05 NOTE — Addendum Note (Signed)
Addended by: Kittie Plater, Marieann Zipp HUA on: 11/05/2020 09:30 AM   Modules accepted: Orders

## 2020-11-18 NOTE — Patient Instructions (Addendum)
Mild persistent asthma Continue Pulmicort mcg 2 puffs once a day with spacer to help prevent cough and wheeze. For asthma flares/ upper respiratory infections increase Pulmicort 180 mcg 2 puffs twice a day until symptoms are back to baseline Continue montelukast (Singulair) 10 mg once a day to help prevent cough and wheeze May use albuterol 2 puffs every 4-6 hours as needed for cough, wheeze, tightness in chest, shortness of breath.  Also may use albuterol 2 puffs 5 to 15 minutes prior to exercise  Allergic rhinoconjunctivitis (2021 skin testing shows intradermal's mildly reactive to grass pollen, tree pollen, mold mix #4, and dust mite) We will order a sinus CT with no contrast to follow-up on your sinus infections. We will call you once this is scheduled We will also get some lab work to recheck your immune system since you have had sinus infections Continue azelastine/fluticasone nasal spray 1 spray each nostril twice a day as needed for runny nose/stuffy nose/drainage down throat May use sinus rinse as needed for nasal symptoms.  Use this prior to any medicated nasal sprays For thick postnasal drip may add on guaifenesin (Mucinex) 600 to 1200 mg twice a day as needed.  Make sure to drink plenty of fluids. May use Pataday 1 drop each eye once a day as needed for itchy watery eyes  Schedule an appointment with your primary care physician to discuss heart palpitations  Please let us know if this treatment plan is not working well for you Schedule a follow-up appointment in 2 months or sooner if needed

## 2020-11-19 ENCOUNTER — Other Ambulatory Visit (HOSPITAL_BASED_OUTPATIENT_CLINIC_OR_DEPARTMENT_OTHER): Payer: Self-pay

## 2020-11-19 ENCOUNTER — Other Ambulatory Visit: Payer: Self-pay

## 2020-11-19 ENCOUNTER — Encounter: Payer: Self-pay | Admitting: Family

## 2020-11-19 ENCOUNTER — Ambulatory Visit (INDEPENDENT_AMBULATORY_CARE_PROVIDER_SITE_OTHER): Payer: Medicare Other | Admitting: Family

## 2020-11-19 VITALS — BP 134/74 | HR 72 | Temp 97.7°F | Resp 20

## 2020-11-19 DIAGNOSIS — H1013 Acute atopic conjunctivitis, bilateral: Secondary | ICD-10-CM | POA: Diagnosis not present

## 2020-11-19 DIAGNOSIS — J329 Chronic sinusitis, unspecified: Secondary | ICD-10-CM | POA: Diagnosis not present

## 2020-11-19 DIAGNOSIS — J3089 Other allergic rhinitis: Secondary | ICD-10-CM | POA: Diagnosis not present

## 2020-11-19 DIAGNOSIS — J4540 Moderate persistent asthma, uncomplicated: Secondary | ICD-10-CM

## 2020-11-19 MED ORDER — BUDESONIDE 180 MCG/ACT IN AEPB
1.0000 | INHALATION_SPRAY | Freq: Two times a day (BID) | RESPIRATORY_TRACT | 1 refills | Status: DC
  Filled 2020-11-19 – 2020-12-08 (×2): qty 1, 30d supply, fill #0
  Filled 2021-03-05: qty 1, 30d supply, fill #1
  Filled 2021-05-17: qty 1, 30d supply, fill #2
  Filled 2021-07-30: qty 1, 30d supply, fill #3

## 2020-11-19 MED ORDER — AZELASTINE HCL 0.1 % NA SOLN
NASAL | 1 refills | Status: DC
  Filled 2020-11-19: qty 30, 50d supply, fill #0
  Filled 2020-12-08: qty 30, 30d supply, fill #0
  Filled 2021-04-19: qty 30, 30d supply, fill #1

## 2020-11-19 NOTE — Progress Notes (Signed)
[address] [place] [address] Dept: [phone]  FOLLOW UP NOTE  Patient ID: Monica Wallace, female    DOB: September 11, 1950  Age: 70 y.o. MRN: [id number] Date of Office Visit: 11/19/2020  Assessment  Chief Complaint: Allergic Rhinitis  and Asthma  HPI Monica Wallace is a 70 year old female who presents today for follow-up of moderate persistent asthma and allergic rhinoconjunctivitis.  She was last seen on July 21, 2020 by Dr. Verlin Fester.  Moderate persistent asthma is reported as moderately controlled with Pulmicort Flexhaler 180 mcg 2 puffs once a day, montelukast 10 mg at night, and albuterol as needed.  She reports that she never got the Flovent 110 mcg filled because insurance did not cover it.  She reports a dry cough due to drainage and denies wheezing, tightness in her chest, shortness of breath, and nocturnal awakenings.  Since her last office visit she has been to the emergency room once on October 09, 2020 for shortness of breath, coughing and wheezing.  While there she had a chest x-ray that showed" no active disease."  Her COVID-19 test was negative and she was given a prescription for prednisone.  She has not had to use her albuterol in the past couple weeks.  Allergic rhinoconjunctivitis is reported as not well controlled with azelastine/fluticasone nasal spray, sinus rinse as needed and Pataday eyedrops as needed.  She reports that she watches a child and feels like that she got his cold and then has had problems since.  On October 13, 2020 she went to her primary care for a follow up appointment from her emergency room visit on October 09, 2020 and complained of sinus pain and pressure.  She also had green rhinorrhea at the time.  She was given a prescription for doxycycline.  She reports that this did not do anything for her symptoms.  Then on October 19, 2020 she went to the emergency room for upper respiratory infection and was given a prescription for Augmentin, Tessalon Perles, and  Medrol Dosepak.  She had a chest x-ray on October 19, 2020 that showed" no acute abnormalities."  Her COVID-19 test was negative.  She reports that she now has rhinorrhea that is green in color and sometimes clear.  She also reports postnasal drip, nasal congestion that is better than what it was, and sinus pressure at times.  She denies any sinus tenderness, fever, or chills.  She also reports itchy watery eyes for which Pataday helps.  She has not had any imaging of her sinuses.  Drug Allergies:  Allergies  Allergen Reactions  . Levaquin [Levofloxacin] Shortness Of Breath  . Clindamycin/Lincomycin     Pruritus No rash  . Gabapentin Itching  . Penicillins     Positive on allergy testing 'years ago.' Has never had penicillin. Has tolerated amoxicillin.  . Codeine Itching    Review of Systems: Review of Systems  Constitutional: Negative for chills and fever.  HENT:       Reports rhinorrhea that is sometimes [place] and sometimes clear.  She also reports postnasal drip and nasal congestion that is better.  She also reports sinus pressure at times.  She denies any sinus tenderness  Eyes:       Reports itchy watery eyes at times.  Pataday eyedrops help  Respiratory: Positive for cough. Negative for shortness of breath and wheezing.        Reports dry cough due to postnasal drip.  Denies shortness of breath, tightness in her chest, and wheezing.  Cardiovascular: Positive for palpitations. Negative for chest pain.       Reports last week she was taking more Sudafed than she should and felt like her heart was skipping a beat.  This is better now  Gastrointestinal: Negative for heartburn.  Genitourinary: Negative for dysuria.  Skin: Negative for rash.  Neurological: Positive for headaches.       Reports headaches at times  Endo/Heme/Allergies: Positive for environmental allergies.    Physical Exam: BP 134/74 (BP Location: Right Arm, Patient Position: Sitting, Cuff Size: Normal)   Pulse 72    Temp 97.7 F (36.5 C) (Temporal)   Resp 20   SpO2 98%    Physical Exam Constitutional:      Appearance: Normal appearance.  HENT:     Head: Normocephalic and atraumatic.     Comments: Pharynx normal, eyes normal, nose: Bilateral lower turbinates mildly edematous and slightly erythematous with no drainage noted    Right Ear: Tympanic membrane, ear canal and external ear normal.     Left Ear: Tympanic membrane, ear canal and external ear normal.     Mouth/Throat:     Mouth: Mucous membranes are moist.     Pharynx: Oropharynx is clear.  Eyes:     Conjunctiva/sclera: Conjunctivae normal.  Cardiovascular:     Rate and Rhythm: Regular rhythm.     Heart sounds: Normal heart sounds.  Pulmonary:     Effort: Pulmonary effort is normal.     Breath sounds: Normal breath sounds.     Comments: Lungs clear to auscultation Musculoskeletal:     Cervical back: Neck supple.  Skin:    General: Skin is warm.  Neurological:     Mental Status: She is alert and oriented to person, place, and time.  Psychiatric:        Mood and Affect: Mood normal.        Behavior: Behavior normal.        Thought Content: Thought content normal.        Judgment: Judgment normal.     Diagnostics: FVC 2.14 L, FEV1 1.78 L.  Predicted FVC 2.09 L, FEV1 1.62 L.  Spirometry indicates normal ventilatory function  Assessment and Plan: 1. Moderate persistent asthma, unspecified whether complicated   2. Recurrent sinus infections   3. Allergic rhinitis   4. Allergic conjunctivitis of both eyes     Meds ordered this encounter  Medications  . azelastine (ASTELIN) 0.1 % nasal spray    Sig: Place 1 spray in each nostril twice a day as needed for runny nose/drainage down throat    Dispense:  90 mL    Refill:  1    Please dispense 90 day supply.  . budesonide (PULMICORT) 180 MCG/ACT inhaler    Sig: Inhale 1-2 puffs into the lungs 2 (two) times daily.    Dispense:  3 each    Refill:  1    Please dispense 90 day  supply.    Patient Instructions  Mild persistent asthma Continue Pulmicort mcg 2 puffs once a day with spacer to help prevent cough and wheeze. For asthma flares/ upper respiratory infections increase Pulmicort 180 mcg 2 puffs twice a day until symptoms are back to baseline Continue montelukast (Singulair) 10 mg once a day to help prevent cough and wheeze May use albuterol 2 puffs every 4-6 hours as needed for cough, wheeze, tightness in chest, shortness of breath.  Also may use albuterol 2 puffs 5 to 15 minutes prior to exercise  Allergic  rhinoconjunctivitis (2021 skin testing shows intradermal's mildly reactive to grass pollen, tree pollen, mold mix #4, and dust mite) We will order a sinus CT with no contrast to follow-up on your sinus infections. We will call you once this is scheduled We will also get some lab work to recheck your immune system since you have had sinus infections Continue azelastine/fluticasone nasal spray 1 spray each nostril twice a day as needed for runny nose/stuffy nose/drainage down throat May use sinus rinse as needed for nasal symptoms.  Use this prior to any medicated nasal sprays For thick postnasal drip may add on guaifenesin (Mucinex) 600 to 1200 mg twice a day as needed.  Make sure to drink plenty of fluids. May use Pataday 1 drop each eye once a day as needed for itchy watery eyes  Schedule an appointment with your primary care physician to discuss heart palpitations  Please let us know if this treatment plan is not working well for you Schedule a follow-up appointment in 2 months or sooner if needed   Return in about 2 months (around 01/19/2021), or if symptoms worsen or fail to improve.    Thank you for the opportunity to care for this patient.  Please do not hesitate to contact me with questions.  Althea Charon, FNP [facility]

## 2020-11-20 ENCOUNTER — Telehealth: Payer: Self-pay

## 2020-11-20 ENCOUNTER — Other Ambulatory Visit (HOSPITAL_BASED_OUTPATIENT_CLINIC_OR_DEPARTMENT_OTHER): Payer: Self-pay | Admitting: Family

## 2020-11-20 DIAGNOSIS — J3089 Other allergic rhinitis: Secondary | ICD-10-CM

## 2020-11-20 DIAGNOSIS — J329 Chronic sinusitis, unspecified: Secondary | ICD-10-CM

## 2020-11-20 NOTE — Telephone Encounter (Signed)
fax'd ct sinus w/o contrast orders along with insurance information. Pt. Didn't need a PA.  Fax'd orders to [facility] scheduling department. Spoke to [place] she will call patient once she has the patient scheduled.

## 2020-11-23 ENCOUNTER — Other Ambulatory Visit (HOSPITAL_BASED_OUTPATIENT_CLINIC_OR_DEPARTMENT_OTHER): Payer: Self-pay | Admitting: Family Medicine

## 2020-11-23 NOTE — Telephone Encounter (Signed)
Spoke to [place] at [facility]. Patient is scheduled for May 14th at 11:00 am. For her Sinus CT Patient is aware.

## 2020-11-23 NOTE — Telephone Encounter (Signed)
Thank you :)

## 2020-11-26 ENCOUNTER — Other Ambulatory Visit (HOSPITAL_BASED_OUTPATIENT_CLINIC_OR_DEPARTMENT_OTHER): Payer: Self-pay

## 2020-11-28 ENCOUNTER — Ambulatory Visit (HOSPITAL_BASED_OUTPATIENT_CLINIC_OR_DEPARTMENT_OTHER)
Admission: RE | Admit: 2020-11-28 | Discharge: 2020-11-28 | Disposition: A | Discharge status: Home / Self Care | Payer: Medicare Other | Source: Ambulatory Visit | Attending: Family | Admitting: Family

## 2020-11-28 ENCOUNTER — Other Ambulatory Visit: Payer: Self-pay

## 2020-11-28 DIAGNOSIS — J3089 Other allergic rhinitis: Secondary | ICD-10-CM | POA: Insufficient documentation

## 2020-11-28 DIAGNOSIS — J329 Chronic sinusitis, unspecified: Secondary | ICD-10-CM | POA: Diagnosis not present

## 2020-11-28 DIAGNOSIS — M47812 Spondylosis without myelopathy or radiculopathy, cervical region: Secondary | ICD-10-CM | POA: Diagnosis not present

## 2020-11-28 DIAGNOSIS — J3489 Other specified disorders of nose and nasal sinuses: Secondary | ICD-10-CM | POA: Diagnosis not present

## 2020-11-28 DIAGNOSIS — J321 Chronic frontal sinusitis: Secondary | ICD-10-CM | POA: Diagnosis not present

## 2020-11-30 DIAGNOSIS — J329 Chronic sinusitis, unspecified: Secondary | ICD-10-CM | POA: Diagnosis not present

## 2020-11-30 NOTE — Progress Notes (Signed)
Please let Monica Wallace know that her sinus CT shows ome mild edema (swelling). This could be due to her allergies. It also looks like her sinuses are not draining properly. Has she seen ENT in the past? If so she can schedule an appointment with them to discuss. If she has not seen ENT please refer her to ENT-Dr. Benjamine Mola due to abnormal sinus ct results.

## 2020-12-02 NOTE — Progress Notes (Signed)
Please call Monica Wallace and let me know how she is doing/feeling.

## 2020-12-03 NOTE — Progress Notes (Signed)
Did you try calling her yesterday to see how she is doing/feeling? I do not see any documentation,but it was sent back to me. Please call and ask if you did not. Thank you!

## 2020-12-08 ENCOUNTER — Ambulatory Visit (INDEPENDENT_AMBULATORY_CARE_PROVIDER_SITE_OTHER): Payer: Medicare Other | Admitting: Internal Medicine

## 2020-12-08 ENCOUNTER — Other Ambulatory Visit: Payer: Self-pay

## 2020-12-08 ENCOUNTER — Other Ambulatory Visit (HOSPITAL_BASED_OUTPATIENT_CLINIC_OR_DEPARTMENT_OTHER): Payer: Self-pay

## 2020-12-08 ENCOUNTER — Encounter: Payer: Self-pay | Admitting: Internal Medicine

## 2020-12-08 VITALS — BP 126/80 | HR 78 | Ht 62.0 in | Wt 194.0 lb

## 2020-12-08 DIAGNOSIS — E559 Vitamin D deficiency, unspecified: Secondary | ICD-10-CM

## 2020-12-08 DIAGNOSIS — C73 Malignant neoplasm of thyroid gland: Secondary | ICD-10-CM | POA: Diagnosis not present

## 2020-12-08 DIAGNOSIS — E890 Postprocedural hypothyroidism: Secondary | ICD-10-CM | POA: Diagnosis not present

## 2020-12-08 NOTE — Progress Notes (Signed)
Name: Monica Wallace  MRN/ DOB: [id number], 1951-04-18    Age/ Sex: 70 y.o., female    PCP: [name], MD   Reason for Endocrinology Evaluation: Postoperative Hypothyroidism     Date of Initial Endocrinology Evaluation: 12/08/2020     HPI: [name] is a 70 y.o. female with a past medical history of T2DM, Postoperative hypothyroidism and dyslipidemia . 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   An ultrasound in 2002 showed MNG with a very large right thyoid nodule 3.6x2.3x1.7 cm   She is S/P total thyroidectomy in 2002 , for PTC ,encapsulated tumor, low grade    12/18/2020 S/P RAI remnant ablation 100 mCi of I-131.    12/28/2000: Posttreatment whole-body scan: Residual functioning thyroid tissue in the thyroid bed in the midline and extending towards the left. No distant metastases identified. 08/02/2006: Thyrogen stimulated I-123 whole body scan: Normal physiologic uptake in nares, salivary glands, gastrointestinal tract and urinary tract. No abnormal uptake indicating any functioning thyroid tissue.     She is currently on Synthroid 150 mcg daily except Saturday ( takes half on saturdays)   Weight fluctuates  Has IBS and has alternative bowel movement  Has occasional palpitations, as a yound child was diagnosed with MVP but this was discredited by cardiology- work up negative   No tremors Has occasional anxiety  No local neck symptoms   Has chronic issues with sinuses   Mother with thyroid disease , S/P total thyroidectomy due to benign nodule.      HISTORY:  Past Medical History:  Past Medical History:  Diagnosis Date  . Arthritis   . Asthma   . Baker's cyst of knee, right 02/16/2017  . Cancer ([place])     thyroid (2002)  . Cystic breast   . Diabetes ([place])   . GERD (gastroesophageal reflux disease)   . Hepatic cyst 02/02/2015  . Hyperlipidemia   . Hypertension   . Insomnia 07/23/2016  . Menopause   . Migraine   . Palpitations   . Preventative health care 01/18/2016  . Sleep apnea 02/02/2015  . Thyroid disease    Past Surgical History:  Past Surgical History:  Procedure Laterality Date  . ABDOMINAL HYSTERECTOMY     took right ovary and uterus  . APPENDECTOMY    . CARPAL TUNNEL RELEASE Right   . CESAREAN SECTION    . CHOLECYSTECTOMY    . EYE SURGERY Bilateral 2017   cataracts  . KNEE ARTHROPLASTY    . THYROIDECTOMY  2002      Social History:  reports that she has never smoked. She has never used smokeless tobacco. She reports current alcohol use. She reports that she does not use drugs.  Family History: family history includes Allergic rhinitis in her brother and sister; Arthritis in her father, mother, and sister; Asthma in her maternal grandfather; Breast cancer (age of onset: 32) in her sister; Cancer in her sister; Cancer (age of onset: 69) in her sister; Dementia in her paternal grandmother; Depression in her maternal grandfather; Diabetes in her father and paternal grandfather; Fibromyalgia in her sister; GER disease in her sister; Gout in her sister; Heart disease in her father; Hyperlipidemia in her father and mother; Hypertension in her brother, father, and  mother; Kidney disease in her father; Scoliosis in her daughter; Transient ischemic attack in her mother.   HOME MEDICATIONS: Allergies as of 12/08/2020      Reactions   Levaquin [levofloxacin] Shortness Of Breath   Clindamycin/lincomycin    Pruritus No rash   Gabapentin Itching   Penicillins    Positive on allergy testing 'years ago.' Has never had penicillin. Has tolerated amoxicillin.   Codeine Itching      Medication List       Accurate as of Dec 08, 2020  3:29 PM. If you have any questions, ask your nurse or  doctor.        acetaminophen 325 MG tablet Commonly known as: TYLENOL Take 650 mg by mouth every 6 (six) hours as needed.   albuterol 108 (90 Base) MCG/ACT inhaler Commonly known as: VENTOLIN HFA INHALE 2 PUFFS INTO THE LUNGS EVERY 6 HOURS AS NEEDED FOR WHEEZING OR SHORTNESS OF BREATH   azelastine 0.1 % nasal spray Commonly known as: ASTELIN Place 1 spray in each nostril twice a day as needed for runny nose/drainage down throat   benzonatate 100 MG capsule Commonly known as: Tessalon Perles Take 1-2 capsules (100-200 mg total) by mouth 3 (three) times daily as needed for cough.   budesonide 180 MCG/ACT inhaler Commonly known as: PULMICORT Inhale 1-2 puffs into the lungs 2 (two) times daily.   [employer]S VIT D 5000 HIGH-POTENCY PO Take 5,000 Units by mouth.   fluconazole 150 MG tablet Commonly known as: DIFLUCAN Take 1 tablet (150 mg total) by mouth once a week.   fluticasone 50 MCG/ACT nasal spray Commonly known as: FLONASE Place 2 sprays into both nostrils daily.   loratadine 10 MG tablet Commonly known as: CLARITIN Take 10 mg by mouth in the morning and at bedtime.   montelukast 10 MG tablet Commonly known as: SINGULAIR Take 1 tablet (10 mg total) by mouth daily as needed.   Olopatadine HCl 0.2 % Soln Apply 1 drop to eye daily as needed.   Omega-3 1000 MG Caps Take 1,000 mg by mouth daily.   omeprazole 40 MG capsule Commonly known as: PRILOSEC Take 1 capsule by mouth daily.   OneTouch Delica Plus [id number] Misc Use to check blood glucose once a day.  DX code: E11.9   OneTouch Verio Flex System w/Device Kit Use to check blood glucose once a day.  DX code: E11.9   OneTouch Verio test strip Generic drug: glucose blood Use to check blood glucose once a day.  DX code: E11.9   rosuvastatin 5 MG tablet Commonly known as: CRESTOR TAKE 1 TABLET BY MOUTH 2 TIMES A WEEK(TUESDAYS AND SUNDAYS WEEKLY)   Synthroid 150 MCG tablet Generic drug: levothyroxine TAKE 1  TABLET(150 MCG) BY MOUTH DAILY BEFORE AND BREAKFAST   triamterene-hydrochlorothiazide 75-50 MG tablet Commonly known as: MAXZIDE Take 1 tablet by mouth daily.   vitamin C 100 MG tablet Take 100 mg by mouth as needed.         REVIEW OF SYSTEMS: A comprehensive ROS was conducted with the patient and is negative except as per HPI and below:  ROS     OBJECTIVE:  VS: BP 126/80   Pulse 78   Ht 5' 2"  (1.575 m)   Wt 194 lb (88 kg)   SpO2 98%   BMI 35.48 kg/m    Wt Readings from Last 3 Encounters:  12/08/20 194 lb (88 kg)  10/29/20 195 lb 3.2 oz (88.5 kg)  10/19/20 192 lb (87.1  kg)     EXAM: General: Pt appears well and is in NAD  Hydration: Well-hydrated with moist mucous membranes and good skin turgor  Eyes: External eye exam normal without stare, lid lag or exophthalmos.  EOM intact.  PERRL.  Ears, Nose, Throat: Hearing: Grossly intact bilaterally Dental: Good dentition  Throat: Clear without mass, erythema or exudate  Neck: General: Supple without adenopathy. Thyroid: Thyroid size normal.  No goiter or nodules appreciated. No thyroid bruit.  Lungs: Clear with good BS bilat with no rales, rhonchi, or wheezes  Heart: Auscultation: RRR.  Abdomen: Normoactive bowel sounds, soft, nontender, without masses or organomegaly palpable  Extremities: Gait and station: Normal gait  Digits and nails: No clubbing, cyanosis, petechiae, or nodes Head and neck: Normal alignment and mobility BL UE: Normal ROM and strength. BL LE: No pretibial edema normal ROM and strength.  Skin: Hair: Texture and amount normal with gender appropriate distribution Skin Inspection: No rashes, acanthosis nigricans/skin tags. No lipohypertrophy Skin Palpation: Skin temperature, texture, and thickness normal to palpation  Neuro: Cranial nerves: II - XII grossly intact  Cerebellar: Normal coordination and movement; no tremor Motor: Normal strength throughout DTRs: 2+ and symmetric in UE without delay in  relaxation phase  Mental Status: Judgment, insight: Intact Orientation: Oriented to time, place, and person Memory: Intact for recent and remote events Mood and affect: No depression, anxiety, or agitation     DATA REVIEWED: Results for PRINCESS, KARNES (MRN [id number]) as of 12/09/2020 15:58  Ref. Range 12/08/2020 15:55  TSH Latest Ref Range: 0.35 - 4.50 uIU/mL 0.80  Thyroglobulin Latest Units: ng/mL 0.1 (L)  Thyroglobulin Ab Latest Ref Range: < or = 1 IU/mL <1      Thyroid Ultrasound 08/23/2013  Right thyroid lobe   Surgically absent   Left thyroid lobe   Surgically absent   Isthmus   Surgically absent   Lymphadenopathy   None visualized.   IMPRESSION:  No evidence of residual or recurrent tissue post thyroidectomy.   ASSESSMENT/PLAN/RECOMMENDATIONS:   1. Postsurgical Hypothyroidism :  Patient is clinically and biochemically euthyroid - Pt educated extensively on the correct way to take levothyroxine (first thing in the morning with water, 30 minutes before eating or taking other medications). - Pt encouraged to double dose the following day if she were to miss a dose given long half-life of levothyroxine.   Medications : Synthroid 150 MCG, half a tablet on Sundays and 1 tablet the rest of the week  2. Hx pf PTC:  - TSH goal 0.5-2.0 uIU/mL  - She has excellent response to treatment - She is at low risk of recurrence TG and TG antibody are undetectable    3. T2DM :   -Patient wanted to discuss her diagnosis type 2 diabetes, she is not on any glycemic agents, her A1c is optimal and I have encouraged her to continue with lifestyle changes - Discussed low car diet and exercise  - A1c at goal  - NO indication to start oral glycemic agents  -This is managed by her PCP   4.  History of vitamin D deficiency:  -Repeat testing is normal F/u in 6 months   Signed electronically by: Mack Guise, MD  [facility] End[facility]  [address] [place], [place] [address] Phone: [phone] FAX: [phone]   CC: [name], [address] [place] [address] Phone: [phone] Fax: [phone]   Return to Endocrinology clinic as  below: Future Appointments  Date Time Provider [place]  01/26/2021  4:00 PM Althea Charon, FNP [facility] None  03/02/2021  1:20 PM [name], MD [facility]

## 2020-12-08 NOTE — Patient Instructions (Signed)
You are on Synthroid  - which is your thyroid hormone supplement. You MUST take this consistently.  You should take this first thing in the morning on an empty stomach with water. You should not take it with other medications. Wait 60min to 1hr prior to eating. If you are taking any vitamins - please take these in the evening.   If you miss a dose, please take your missed dose the following day (double the dose for that day). You should have a pill box for ONLY Synthroid on your bedside table to help you remember to take your medications.

## 2020-12-09 LAB — TSH: TSH: 0.8 u[IU]/mL (ref 0.35–4.50)

## 2020-12-09 LAB — VITAMIN D 25 HYDROXY (VIT D DEFICIENCY, FRACTURES): VITD: 45.39 ng/mL (ref 30.00–100.00)

## 2020-12-09 LAB — THYROGLOBULIN LEVEL: Thyroglobulin: 0.1 ng/mL — ABNORMAL LOW

## 2020-12-09 LAB — THYROGLOBULIN ANTIBODY: Thyroglobulin Ab: <1 IU/mL (ref <=1)

## 2020-12-11 LAB — STREP PNEUMONIAE 23 SEROTYPES IGG
Pneumo Ab Type 1*: 1.9 ug/mL (ref >1.3)
Pneumo Ab Type 12 (12F)*: 0.1 ug/mL — ABNORMAL LOW (ref >1.3)
Pneumo Ab Type 14*: 0.9 ug/mL — ABNORMAL LOW (ref >1.3)
Pneumo Ab Type 17 (17F)*: 0.7 ug/mL — ABNORMAL LOW (ref >1.3)
Pneumo Ab Type 19 (19F)*: 2.5 ug/mL (ref >1.3)
Pneumo Ab Type 2*: 0.2 ug/mL — ABNORMAL LOW (ref >1.3)
Pneumo Ab Type 20*: 0.2 ug/mL — ABNORMAL LOW (ref >1.3)
Pneumo Ab Type 22 (22F)*: 0.3 ug/mL — ABNORMAL LOW (ref >1.3)
Pneumo Ab Type 23 (23F)*: 0.8 ug/mL — ABNORMAL LOW (ref >1.3)
Pneumo Ab Type 26 (6B)*: 1.2 ug/mL — ABNORMAL LOW (ref >1.3)
Pneumo Ab Type 3*: 2.7 ug/mL (ref >1.3)
Pneumo Ab Type 34 (10A)*: 0.4 ug/mL — ABNORMAL LOW (ref >1.3)
Pneumo Ab Type 4*: 1 ug/mL — ABNORMAL LOW (ref >1.3)
Pneumo Ab Type 43 (11A)*: 0.4 ug/mL — ABNORMAL LOW (ref >1.3)
Pneumo Ab Type 5*: 0.3 ug/mL — ABNORMAL LOW (ref >1.3)
Pneumo Ab Type 51 (7F)*: 5.4 ug/mL (ref >1.3)
Pneumo Ab Type 54 (15B)*: 0.2 ug/mL — ABNORMAL LOW (ref >1.3)
Pneumo Ab Type 56 (18C)*: 0.8 ug/mL — ABNORMAL LOW (ref >1.3)
Pneumo Ab Type 57 (19A)*: 2.8 ug/mL (ref >1.3)
Pneumo Ab Type 68 (9V)*: 6.1 ug/mL (ref >1.3)
Pneumo Ab Type 70 (33F)*: 0.4 ug/mL — ABNORMAL LOW (ref >1.3)
Pneumo Ab Type 8*: 0.5 ug/mL — ABNORMAL LOW (ref >1.3)
Pneumo Ab Type 9 (9N)*: 3.6 ug/mL (ref >1.3)

## 2020-12-11 LAB — COMPREHENSIVE METABOLIC PANEL
ALT: 23 IU/L (ref 0–32)
AST: 21 IU/L (ref 0–40)
Albumin/Globulin Ratio: 1.5 (ref 1.2–2.2)
Albumin: 4.1 g/dL (ref 3.7–4.7)
Alkaline Phosphatase: 88 IU/L (ref 44–121)
BUN/Creatinine Ratio: 21 (ref 12–28)
BUN: 17 mg/dL (ref 8–27)
Bilirubin Total: <0.2 mg/dL (ref 0.0–1.2)
CO2: 27 mmol/L (ref 20–29)
Calcium: 9.6 mg/dL (ref 8.7–10.3)
Chloride: 99 mmol/L (ref 96–106)
Creatinine, Ser: 0.81 mg/dL (ref 0.57–1.00)
Globulin, Total: 2.8 g/dL (ref 1.5–4.5)
Glucose: 91 mg/dL (ref 65–99)
Potassium: 4.3 mmol/L (ref 3.5–5.2)
Sodium: 141 mmol/L (ref 134–144)
Total Protein: 6.9 g/dL (ref 6.0–8.5)
eGFR: 78 mL/min/{1.73_m2} (ref >59)

## 2020-12-11 LAB — CBC WITH DIFFERENTIAL
Basophils Absolute: 0.1 10*3/uL (ref 0.0–0.2)
Basos: 1 %
EOS (ABSOLUTE): 0.2 10*3/uL (ref 0.0–0.4)
Eos: 1 %
Hematocrit: 40.9 % (ref 34.0–46.6)
Hemoglobin: 13.3 g/dL (ref 11.1–15.9)
Immature Grans (Abs): 0.1 10*3/uL (ref 0.0–0.1)
Immature Granulocytes: 1 %
Lymphocytes Absolute: 5.9 10*3/uL — ABNORMAL HIGH (ref 0.7–3.1)
Lymphs: 45 %
MCH: 29.2 pg (ref 26.6–33.0)
MCHC: 32.5 g/dL (ref 31.5–35.7)
MCV: 90 fL (ref 79–97)
Monocytes Absolute: 1 10*3/uL — ABNORMAL HIGH (ref 0.1–0.9)
Monocytes: 7 %
Neutrophils Absolute: 5.9 10*3/uL (ref 1.4–7.0)
Neutrophils: 45 %
RBC: 4.55 x10E6/uL (ref 3.77–5.28)
RDW: 13.1 % (ref 11.7–15.4)
WBC: 13 10*3/uL — ABNORMAL HIGH (ref 3.4–10.8)

## 2020-12-11 LAB — COMPLEMENT, TOTAL: Compl, Total (CH50): >60 U/mL (ref >41)

## 2020-12-11 LAB — DIPHTHERIA / TETANUS ANTIBODY PANEL
Diphtheria Ab: 0.11 IU/mL (ref <0.10)
Tetanus Ab, IgG: 0.49 IU/mL (ref <0.10)

## 2020-12-18 NOTE — Progress Notes (Signed)
Please call Mikiya and let her know that her white blood cell count was elevated a little. That is why I had the nurse call her  and see how she was feeling.   Her tetanus and diptheria antibodies are protective.  Her streptococcal pneumonia titers: This shows that you are protective to only 7 out of 23 types of Streptococcus pneumonia, which is a bacteria that can cause sinus infections, ear infections as well as pneumonia.  The next step is to get a Pneumovax and then test these levels again in 4 to 6 weeks after that.  We can give the vaccine here if she would like, unless she has Medicaid or Medicare in which case she would be required to get this at her primary care physician's office or the health department.  Her complement activity was normal.  This is additional proteins that helps your immune system work better.  Her comprehensive metabolic panel was normal

## 2020-12-21 DIAGNOSIS — M5412 Radiculopathy, cervical region: Secondary | ICD-10-CM | POA: Diagnosis not present

## 2020-12-21 NOTE — Telephone Encounter (Signed)
Lm for pt to call us back about her results

## 2020-12-21 NOTE — Telephone Encounter (Signed)
Yes. See below Please let Monica Wallace know that her sinus CT shows ome mild edema (swelling). This could be due to her allergies. It also looks like her sinuses are not draining properly. Has she seen ENT in the past? If so she can schedule an appointment with them to discuss. If she has not seen ENT please refer her to ENT-Dr. Benjamine Mola due to abnormal sinus ct results  (see CT results for conversation.

## 2020-12-21 NOTE — Telephone Encounter (Signed)
Did you review her ct scan of her sinuses from 11/28/2020?

## 2020-12-23 ENCOUNTER — Encounter: Payer: Self-pay | Admitting: *Deleted

## 2020-12-23 ENCOUNTER — Other Ambulatory Visit: Payer: Self-pay | Admitting: *Deleted

## 2020-12-23 DIAGNOSIS — J329 Chronic sinusitis, unspecified: Secondary | ICD-10-CM

## 2020-12-25 ENCOUNTER — Other Ambulatory Visit: Payer: Self-pay | Admitting: Family Medicine

## 2020-12-28 DIAGNOSIS — H43813 Vitreous degeneration, bilateral: Secondary | ICD-10-CM | POA: Diagnosis not present

## 2020-12-28 DIAGNOSIS — E119 Type 2 diabetes mellitus without complications: Secondary | ICD-10-CM | POA: Diagnosis not present

## 2020-12-28 DIAGNOSIS — Z961 Presence of intraocular lens: Secondary | ICD-10-CM | POA: Diagnosis not present

## 2020-12-28 DIAGNOSIS — H26493 Other secondary cataract, bilateral: Secondary | ICD-10-CM | POA: Diagnosis not present

## 2020-12-28 LAB — HM DIABETES EYE EXAM

## 2020-12-31 DIAGNOSIS — J320 Chronic maxillary sinusitis: Secondary | ICD-10-CM | POA: Diagnosis not present

## 2020-12-31 DIAGNOSIS — J321 Chronic frontal sinusitis: Secondary | ICD-10-CM | POA: Diagnosis not present

## 2021-01-06 ENCOUNTER — Other Ambulatory Visit (HOSPITAL_BASED_OUTPATIENT_CLINIC_OR_DEPARTMENT_OTHER): Payer: Self-pay

## 2021-01-06 DIAGNOSIS — J321 Chronic frontal sinusitis: Secondary | ICD-10-CM | POA: Diagnosis not present

## 2021-01-06 DIAGNOSIS — J320 Chronic maxillary sinusitis: Secondary | ICD-10-CM | POA: Diagnosis not present

## 2021-01-06 MED ORDER — AMOXICILLIN-POT CLAVULANATE 875-125 MG PO TABS
ORAL_TABLET | ORAL | 0 refills | Status: DC
  Filled 2021-01-06: qty 10, 5d supply, fill #0

## 2021-01-06 MED ORDER — PREDNISONE 10 MG PO TABS
ORAL_TABLET | ORAL | 0 refills | Status: DC
  Filled 2021-01-06: qty 21, 6d supply, fill #0

## 2021-01-07 ENCOUNTER — Telehealth: Payer: Self-pay

## 2021-01-07 NOTE — Telephone Encounter (Signed)
Patient called stating her allergist recommended pneumonia vaccine. She has on file prevnar 13 in 2017. I have scheduled her for [place] next week however could you please clarify which pneumonia shot she is needing 23 or 20?

## 2021-01-08 ENCOUNTER — Other Ambulatory Visit: Payer: Self-pay | Admitting: Family Medicine

## 2021-01-08 DIAGNOSIS — M542 Cervicalgia: Secondary | ICD-10-CM

## 2021-01-08 DIAGNOSIS — S46819A Strain of other muscles, fascia and tendons at shoulder and upper arm level, unspecified arm, initial encounter: Secondary | ICD-10-CM

## 2021-01-13 DIAGNOSIS — J321 Chronic frontal sinusitis: Secondary | ICD-10-CM | POA: Diagnosis not present

## 2021-01-13 DIAGNOSIS — J320 Chronic maxillary sinusitis: Secondary | ICD-10-CM | POA: Diagnosis not present

## 2021-01-14 ENCOUNTER — Ambulatory Visit (INDEPENDENT_AMBULATORY_CARE_PROVIDER_SITE_OTHER): Payer: Medicare Other | Admitting: *Deleted

## 2021-01-14 ENCOUNTER — Other Ambulatory Visit: Payer: Self-pay

## 2021-01-14 DIAGNOSIS — Z23 Encounter for immunization: Secondary | ICD-10-CM

## 2021-01-14 NOTE — Progress Notes (Signed)
Patient here for Pneumovax 23 vaccine.  Vaccine given in left deltoid and patient tolerated well.

## 2021-01-25 NOTE — Patient Instructions (Addendum)
Mild persistent asthma Continue Pulmicort 180 mcg 1-2 puffs once a day with spacer to help prevent cough and wheeze. For asthma flares/ upper respiratory infections increase Pulmicort 180 mcg 2 puffs twice a day until symptoms are back to baseline Continue montelukast (Singulair) 10 mg once a day to help prevent cough and wheeze May use albuterol 2 puffs every 4-6 hours as needed for cough, wheeze, tightness in chest, shortness of breath.  Also may use albuterol 2 puffs 5 to 15 minutes prior to exercise  Allergic rhinoconjunctivitis (2021 skin testing shows intradermal's mildly reactive to grass pollen, tree pollen, mold mix #4, and dust mite) Get repeat immune lab work 4-6 weeks after pneumovax. (Had pneumovax 01/14/2021) Continue azelastine/fluticasone nasal spray 1 spray each nostril twice a day as needed for runny nose/stuffy nose/drainage down throat May use sinus rinse as needed for nasal symptoms.  Use this prior to any medicated nasal sprays For thick postnasal drip may add on guaifenesin (Mucinex) 600 to 1200 mg twice a day as needed.  Make sure to drink plenty of fluids. May use Pataday 1 drop each eye once a day as needed for itchy watery eyes Call Dr. Deeann Saint office and let him know about the clear with yellow tinge postnasal drip and some old blood when using sinus rinse.   Please let us know if this treatment plan is not working well for you Schedule a follow-up appointment in 3 months or sooner if needed

## 2021-01-26 ENCOUNTER — Other Ambulatory Visit: Payer: Self-pay

## 2021-01-26 ENCOUNTER — Encounter: Payer: Self-pay | Admitting: Family

## 2021-01-26 ENCOUNTER — Ambulatory Visit (INDEPENDENT_AMBULATORY_CARE_PROVIDER_SITE_OTHER): Payer: Medicare Other | Admitting: Family

## 2021-01-26 VITALS — BP 128/64 | HR 79 | Temp 97.9°F | Resp 18 | Ht 62.0 in | Wt 200.1 lb

## 2021-01-26 DIAGNOSIS — J4540 Moderate persistent asthma, uncomplicated: Secondary | ICD-10-CM

## 2021-01-26 DIAGNOSIS — H1013 Acute atopic conjunctivitis, bilateral: Secondary | ICD-10-CM | POA: Diagnosis not present

## 2021-01-26 DIAGNOSIS — J3089 Other allergic rhinitis: Secondary | ICD-10-CM | POA: Diagnosis not present

## 2021-01-26 DIAGNOSIS — J329 Chronic sinusitis, unspecified: Secondary | ICD-10-CM

## 2021-01-26 MED ORDER — MONTELUKAST SODIUM 10 MG PO TABS: ORAL_TABLET | ORAL | 1 refills | Status: DC

## 2021-01-26 NOTE — Progress Notes (Signed)
[address] [place] [address] Dept: [phone]  FOLLOW UP NOTE  Patient ID: Monica Wallace, female    DOB: 06-23-1951  Age: 70 y.o. MRN: [id number] Date of Office Visit: 01/26/2021  Assessment  Chief Complaint: Follow-up and Asthma (Well managed since last ENT visit few weeks ago. 01/06/21 since have notice a major improvement in sinus issues./Had Pneumovax 01/14/21)  HPI Monica Wallace is a 70 year old female who presents today for follow-up of moderate persistent asthma, recurrent sinus infections, allergic rhinitis, and allergic conjunctivitis.  She was last seen on Nov 19, 2020 by Althea Charon, FNP.  Moderate persistent asthma is reported as controlled with Pulmicort 180 mcg 1 puff once a day, montelukast 10 mg once a day, and albuterol as needed.  She reports occasional cough due to postnasal drip, but reports this is not as bad as it used to be.  She denies wheezing, tightness in her chest, shortness of breath, and nocturnal awakenings.  Since her last office visit she is not made any trips to the emergency room or urgent care due to breathing problems.  She has been on 1 round of steroids after having a sinus procedure by Dr. Benjamine Mola on January 06, 2021.  She cannot remember the last time she used her albuterol inhaler.  Recurrent infections is reported as doing better since having a sinus procedure completed on January 06, 2021 by Dr. Benjamine Mola.  She reports that he opened up her sinuses that were obstructed.  She has a follow-up appointment with him on July 20.  She reports that she has not had any sinus infections since we last saw her, but after the sinus procedure she was given steroids and an antibiotic.  She received her Pneumovax on June 30 and will get repeat lab work in 4 to 6 weeks.  Allergic rhinitis is reported as moderately controlled with azelastine nasal spray 1 spray each nostril twice a day, fluticasone nasal spray 1 spray each nostril twice a day and saline rinse in the  morning.  She reports that Dr. Benjamine Mola told her that she could continue with this regimen.  She reports clear to yellow-tinged postnasal drip with some old blood.  She will also see this when she does her sinus rinse.  She denies any rhinorrhea, nasal congestion, sinus tenderness, fever and chills.  Allergic conjunctivitis is reported as moderately controlled with Pataday eyedrops as needed.  She reports that her right eye has been itchier than her left.  She also mentions that she has dry eyes.  She reports that she had cataract surgery back in 2017 and approximately a month ago she saw her optometrist who told her that she did have some scar tissue.  She reports that she has an upcoming appointment for laser eye surgery.   Drug Allergies:  Allergies  Allergen Reactions   Levaquin [Levofloxacin] Shortness Of Breath   Clindamycin/Lincomycin     Pruritus No rash   Gabapentin Itching   Penicillins     Positive on allergy testing 'years ago.' Has never had penicillin. Has tolerated amoxicillin.   Codeine Itching    Review of Systems: Review of Systems  Constitutional:  Negative for chills and fever.  HENT:         Reports occasional postnasal drip that is not as bad as it used to be.  She reports that postnasal drip is clear with yellow change and some old blood.  Eyes:        Reports itchy watery eyes  right eye greater than left eye.  She reports that in 2017 she had cataract surgery and just last month she saw her optometrist and he noticed scar tissue at her last office visit.  She reports that she is going to have a laser procedure soon.  Respiratory:  Positive for cough. Negative for shortness of breath and wheezing.        Reports occasional cough due to postnasal drip.  Denies wheezing, tightness in her chest, shortness of breath, and nocturnal awakenings  Cardiovascular:  Negative for chest pain and palpitations.  Gastrointestinal:        Currently on omeprazole for reflux.  Reports  she will have some reflux if she eats late at night, but this does not occur that often.  Genitourinary:  Negative for dysuria.  Skin:  Positive for itching. Negative for rash.       Reports itching on her back  Neurological:  Negative for headaches.  Endo/Heme/Allergies:  Positive for environmental allergies.    Physical Exam: BP 128/64 (BP Location: Right Arm, Patient Position: Sitting, Cuff Size: Normal)   Pulse 79   Temp 97.9 F (36.6 C) (Temporal)   Resp 18   Ht 5\' 2"  (1.575 m)   Wt 200 lb 1.6 oz (90.8 kg)   SpO2 100%   BMI 36.60 kg/m    Physical Exam Constitutional:      Appearance: Normal appearance.  HENT:     Head: Normocephalic and atraumatic.     Comments: Pharynx normal, eyes normal, ears normal, nose normal    Right Ear: Tympanic membrane, ear canal and external ear normal.     Left Ear: Tympanic membrane, ear canal and external ear normal.     Nose: Nose normal.     Mouth/Throat:     Mouth: Mucous membranes are moist.     Pharynx: Oropharynx is clear.  Eyes:     Conjunctiva/sclera: Conjunctivae normal.  Cardiovascular:     Rate and Rhythm: Normal rate and regular rhythm.     Heart sounds: Normal heart sounds.  Pulmonary:     Effort: Pulmonary effort is normal.     Breath sounds: Normal breath sounds.     Comments: Lungs clear to auscultation Musculoskeletal:     Cervical back: Neck supple.  Skin:    General: Skin is warm.  Neurological:     Mental Status: She is alert and oriented to person, place, and time.  Psychiatric:        Mood and Affect: Mood normal.        Behavior: Behavior normal.        Thought Content: Thought content normal.        Judgment: Judgment normal.    Diagnostics: FVC 1.71 L, FEV1 1.32 L.  Predicted FVC 2.22 L, predicted FEV1 1.74 L.  Spirometry indicates mild restriction  Assessment and Plan: 1. Moderate persistent asthma, unspecified whether complicated   2. Allergic rhinitis   3. Recurrent sinus infections   4.  Allergic conjunctivitis of both eyes     No orders of the defined types were placed in this encounter.   Patient Instructions  Mild persistent asthma Continue Pulmicort 180 mcg 1-2 puffs once a day with spacer to help prevent cough and wheeze. For asthma flares/ upper respiratory infections increase Pulmicort 180 mcg 2 puffs twice a day until symptoms are back to baseline Continue montelukast (Singulair) 10 mg once a day to help prevent cough and wheeze May use albuterol 2 puffs every  4-6 hours as needed for cough, wheeze, tightness in chest, shortness of breath.  Also may use albuterol 2 puffs 5 to 15 minutes prior to exercise  Allergic rhinoconjunctivitis (2021 skin testing shows intradermal's mildly reactive to grass pollen, tree pollen, mold mix #4, and dust mite) Get repeat immune lab work 4-6 weeks after pneumovax. (Had pneumovax 01/14/2021) Continue azelastine/fluticasone nasal spray 1 spray each nostril twice a day as needed for runny nose/stuffy nose/drainage down throat May use sinus rinse as needed for nasal symptoms.  Use this prior to any medicated nasal sprays For thick postnasal drip may add on guaifenesin (Mucinex) 600 to 1200 mg twice a day as needed.  Make sure to drink plenty of fluids. May use Pataday 1 drop each eye once a day as needed for itchy watery eyes Call Dr. Deeann Saint office and let him know about the clear with yellow tinge postnasal drip and some old blood when using sinus rinse.   Please let us know if this treatment plan is not working well for you Schedule a follow-up appointment in 3 months or sooner if needed Return in about 3 months (around 04/28/2021), or if symptoms worsen or fail to improve.    Thank you for the opportunity to care for this patient.  Please do not hesitate to contact me with questions.  Althea Charon, FNP [facility]

## 2021-01-28 DIAGNOSIS — E119 Type 2 diabetes mellitus without complications: Secondary | ICD-10-CM | POA: Diagnosis not present

## 2021-01-28 DIAGNOSIS — H18413 Arcus senilis, bilateral: Secondary | ICD-10-CM | POA: Diagnosis not present

## 2021-01-28 DIAGNOSIS — H26493 Other secondary cataract, bilateral: Secondary | ICD-10-CM | POA: Diagnosis not present

## 2021-01-28 DIAGNOSIS — Z961 Presence of intraocular lens: Secondary | ICD-10-CM | POA: Diagnosis not present

## 2021-01-28 DIAGNOSIS — H26491 Other secondary cataract, right eye: Secondary | ICD-10-CM | POA: Diagnosis not present

## 2021-01-28 LAB — HM DIABETES EYE EXAM

## 2021-02-03 DIAGNOSIS — J320 Chronic maxillary sinusitis: Secondary | ICD-10-CM | POA: Diagnosis not present

## 2021-02-03 DIAGNOSIS — J321 Chronic frontal sinusitis: Secondary | ICD-10-CM | POA: Diagnosis not present

## 2021-02-04 LAB — HM DIABETES EYE EXAM

## 2021-02-05 NOTE — Progress Notes (Signed)
Subjective:   Elga Santy is a 70 y.o. female who presents for Medicare Annual (Subsequent) preventive examination.  Review of Systems     Cardiac Risk Factors include: advanced age (>40mn, >>58women);diabetes mellitus;dyslipidemia;hypertension;obesity (BMI >30kg/m2)     Objective:    Today's Vitals   02/08/21 1545 02/08/21 1548  BP: (!) 144/76   Pulse: 73   Resp: 16   Temp: 98 F (36.7 C)   TempSrc: Temporal   SpO2: 98%   Weight: 203 lb (92.1 kg)   Height: 5' 1.5" (1.562 m)   PainSc:  4    Body mass index is 37.74 kg/m.  Advanced Directives 02/08/2021 10/09/2020 01/14/2020 01/13/2020 08/08/2019 08/06/2018 07/21/2016  Does Patient Have a Medical Advance Directive? No No - No No No No  Does patient want to make changes to medical advance directive? - - - - - Yes (MAU/Ambulatory/Procedural Areas - Information given) Yes (MAU/Ambulatory/Procedural Areas - Information given)  Would patient like information on creating a medical advance directive? No - Patient declined - No - Patient declined - No - Patient declined - -    Current Medications (verified) Outpatient Encounter Medications as of 02/08/2021  Medication Sig   acetaminophen (TYLENOL) 325 MG tablet Take 650 mg by mouth every 6 (six) hours as needed.   albuterol (VENTOLIN HFA) 108 (90 Base) MCG/ACT inhaler INHALE 2 PUFFS INTO THE LUNGS EVERY 6 HOURS AS NEEDED FOR WHEEZING OR SHORTNESS OF BREATH (Patient taking differently: Inhale 2 puffs into the lungs every 6 (six) hours as needed for wheezing or shortness of breath.)   Ascorbic Acid (VITAMIN C) 100 MG tablet Take 100 mg by mouth as needed.    azelastine (ASTELIN) 0.1 % nasal spray Place 1 spray in each nostril twice a day as needed for runny nose/drainage down throat   benzonatate (TESSALON PERLES) 100 MG capsule Take 1-2 capsules (100-200 mg total) by mouth 3 (three) times daily as needed for cough.   Blood Glucose Monitoring Suppl ([place] w/Device KIT  Use to check blood glucose once a day.  DX code: E11.9   budesonide (PULMICORT) 180 MCG/ACT inhaler Inhale 1-2 puffs into the lungs 2 (two) times daily.   Cholecalciferol ([employer] VIT D 5000 HIGH-POTENCY PO) Take 5,000 Units by mouth.   fluconazole (DIFLUCAN) 150 MG tablet Take 1 tablet (150 mg total) by mouth once a week.   fluticasone (FLONASE) 50 MCG/ACT nasal spray Place 2 sprays into both nostrils daily.   glucose blood (ONETOUCH VERIO) test strip Use to check blood glucose once a day.  DX code: E11.9   Lancets (ONETOUCH DELICA PLUS [id number] MISC Use to check blood glucose once a day.  DX code: E11.9   loratadine (CLARITIN) 10 MG tablet Take 10 mg by mouth in the morning and at bedtime.    montelukast (SINGULAIR) 10 MG tablet TAKE 1 TABLET(10 MG) BY MOUTH DAILY AS NEEDED   Olopatadine HCl 0.2 % SOLN Apply 1 drop to eye daily as needed.   Omega-3 1000 MG CAPS Take 1,000 mg by mouth daily.   omeprazole (PRILOSEC) 40 MG capsule Take 1 capsule by mouth daily.   rosuvastatin (CRESTOR) 5 MG tablet TAKE 1 TABLET BY MOUTH 2 TIMES A WEEK(TUESDAYS AND SUNDAYS WEEKLY)   SYNTHROID 150 MCG tablet TAKE 1 TABLET(150 MCG) BY MOUTH DAILY BEFORE AND BREAKFAST   triamterene-hydrochlorothiazide (MAXZIDE) 75-50 MG tablet TAKE 1 TABLET BY MOUTH DAILY   meloxicam (MOBIC) 7.5 MG tablet TAKE 1 TABLET(7.5 MG) BY  MOUTH DAILY (Patient not taking: Reported on 02/08/2021)   No facility-administered encounter medications on file as of 02/08/2021.    Allergies (verified) Levaquin [levofloxacin], Clindamycin/lincomycin, Gabapentin, Penicillins, and Codeine   History: Past Medical History:  Diagnosis Date   Arthritis    Asthma    Baker's cyst of knee, right 02/16/2017   Cancer ([place])    thyroid (2002)   Cystic breast    Diabetes ([place])    GERD (gastroesophageal reflux disease)    Hepatic cyst 02/02/2015   Hyperlipidemia    Hypertension    Insomnia 07/23/2016   Menopause    Migraine    Palpitations    Preventative  health care 01/18/2016   Sleep apnea 02/02/2015   Thyroid disease    Past Surgical History:  Procedure Laterality Date   ABDOMINAL HYSTERECTOMY     took right ovary and uterus   APPENDECTOMY     CARPAL TUNNEL RELEASE Right    CESAREAN SECTION     CHOLECYSTECTOMY     EYE SURGERY Bilateral 2017   cataracts   KNEE ARTHROPLASTY     THYROIDECTOMY  2002   Family History  Problem Relation Age of Onset   Arthritis Mother    Transient ischemic attack Mother    Hypertension Mother    Hyperlipidemia Mother    Diabetes Father    Heart disease Father    Arthritis Father    Kidney disease Father    Hypertension Father    Hyperlipidemia Father    Cancer Sister 57       pancreatic   Fibromyalgia Sister    Allergic rhinitis Sister    Asthma Maternal Grandfather        smoker   Depression Maternal Grandfather    Dementia Paternal Grandmother    Diabetes Paternal Grandfather    Hypertension Brother    Allergic rhinitis Brother    Scoliosis Daughter        had rods placed and removed   Arthritis Sister    Gout Sister    GER disease Sister    Cancer Sister        breast cancer   Breast cancer Sister 98   Social History   Socioeconomic History   Marital status: Divorced    Spouse name: Not on file   Number of children: Not on file   Years of education: Not on file   Highest education level: Not on file  Occupational History   Not on file  Tobacco Use   Smoking status: Never   Smokeless tobacco: Never  Vaping Use   Vaping Use: Never used  Substance and Sexual Activity   Alcohol use: Yes    Alcohol/week: 0.0 standard drinks    Comment: very rare once or twice a year   Drug use: No   Sexual activity: Yes    Birth control/protection: Other-see comments, Surgical    Comment:  boyfriend  Other Topics Concern   Not on file  Social History Narrative   Not on file   Social Determinants of Health   Financial Resource Strain: Low Risk    Difficulty of Paying Living  Expenses: Not hard at all  Food Insecurity: No Food Insecurity   Worried About Charity fundraiser in the Last Year: Never true   [place] in the Last Year: Never true  Transportation Needs: No Transportation Needs   Lack of Transportation (Medical): No   Lack of Transportation (Non-Medical): No  Physical Activity: Inactive  Days of Exercise per Week: 0 days   Minutes of Exercise per Session: 0 min  Stress: No Stress Concern Present   Feeling of Stress : Not at all  Social Connections: Moderately Isolated   Frequency of Communication with Friends and Family: More than three times a week   Frequency of Social Gatherings with Friends and Family: More than three times a week   Attends Religious Services: More than 4 times per year   Active Member of [employer] or Organizations: No   Attends Music therapist: Never   Marital Status: Divorced    Tobacco Counseling Counseling given: Not Answered   Clinical Intake:  Pre-visit preparation completed: Yes  Pain : 0-10 Pain Score: 4  Pain Type: Chronic pain Pain Location: Neck Pain Onset: More than a month ago Pain Frequency: Intermittent     Nutritional Status: BMI > 30  Obese Nutritional Risks: None Diabetes: Yes CBG done?: No Did pt. bring in CBG monitor from home?: No  How often do you need to have someone help you when you read instructions, pamphlets, or other written materials from your doctor or pharmacy?: 1 - Never  Diabetes:  Is the patient diabetic?  Yes  If diabetic, was a CBG obtained today?  No  Did the patient bring in their glucometer from home?  No  How often do you monitor your CBG's? Twice per week  Financial Strains and Diabetes Management:  Are you having any financial strains with the device, your supplies or your medication? No .  Does the patient want to be seen by Chronic Care Management for management of their diabetes?  No  Would the patient like to be referred to a Nutritionist  or for Diabetic Management?  No   Diabetic Exams:  Diabetic Eye Exam: Completed 01/2021-per patient.   Diabetic Foot Exam:  Pt has been advised about the importance in completing this exam. To be completed by PCP.    Interpreter Needed?: No  Information entered by :: Caroleen Hamman LPN   Activities of Daily Living In your present state of health, do you have any difficulty performing the following activities: 02/08/2021  Hearing? N  Vision? N  Difficulty concentrating or making decisions? N  Walking or climbing stairs? N  Dressing or bathing? N  Doing errands, shopping? N  Preparing Food and eating ? N  Using the Toilet? N  In the past six months, have you accidently leaked urine? N  Do you have problems with loss of bowel control? N  Managing your Medications? N  Managing your Finances? N  Housekeeping or managing your Housekeeping? N  Some recent data might be hidden    Patient Care Team: [name], MD as PCP - General (Family Medicine) Philemon Kingdom, MD as Consulting Physician (Internal Medicine) Juanita Craver, MD as Consulting Physician (Gastroenterology) [place], Mike Gip, MD as Consulting Physician (Pulmonary Disease) Juanita Craver, MD as Consulting Physician (Gastroenterology)  Indicate any recent Medical Services you may have received from other than [facility] providers in the past year (date may be approximate).     Assessment:   This is a routine wellness examination for Naoko.  Hearing/Vision screen Hearing Screening - Comments:: No issues  Dietary issues and exercise activities discussed: Current Exercise Habits: The patient does not participate in regular exercise at present, Exercise limited by: None identified   Goals Addressed             This Visit's Progress  Increase physical activity   Not on track    Increase water intake   Not on track      Depression Screen PHQ 2/9 Scores 02/08/2021 10/13/2020 08/08/2019 08/06/2018 07/21/2016  01/18/2016 09/09/2014  PHQ - 2 Score 0 0 0 0 1 0 0    Fall Risk Fall Risk  02/08/2021 10/13/2020 08/08/2019 08/06/2018 06/01/2018  Falls in the past year? 0 0 0 0 0  Comment - - - - Emmi Telephone Survey: data to providers prior to load  Number falls in past yr: 0 0 0 - -  Injury with Fall? 0 0 0 - -  Follow up Falls prevention discussed - Education provided;Falls prevention discussed - -    FALL RISK PREVENTION PERTAINING TO THE HOME:  Any stairs in or around the home? Yes  If so, are there any without handrails? No  Home free of loose throw rugs in walkways, pet beds, electrical cords, etc? Yes  Adequate lighting in your home to reduce risk of falls? Yes   ASSISTIVE DEVICES UTILIZED TO PREVENT FALLS:  Life alert? No  Use of a cane, walker or w/c? No  Grab bars in the bathroom? Yes  Shower chair or bench in shower? No  Elevated toilet seat or a handicapped toilet? No   TIMED UP AND GO:  Was the test performed? Yes .  Length of time to ambulate 10 feet: 10 sec.   Gait steady and fast without use of assistive device  Cognitive Function:Normal cognitive status assessed by direct observation by this Nurse Health Advisor. No abnormalities found.   MMSE - Mini Mental State Exam 07/21/2016  Orientation to time 5  Orientation to Place 5  Registration 3  Attention/ Calculation 5  Recall 3  Language- name 2 objects 2  Language- repeat 1  Language- follow 3 step command 3  Language- read & follow direction 1  Write a sentence 1  Copy design 1  Total score 30        Immunizations Immunization History  Administered Date(s) Administered   Influenza, High Dose Seasonal PF 05/03/2017, 06/05/2018, 06/06/2020   Influenza-Unspecified 03/19/2015, 05/09/2016   PFIZER(Purple Top)SARS-COV-2 Vaccination 11/02/2019, 11/26/2019, 06/20/2020   Pneumococcal Conjugate-13 01/18/2016   Pneumococcal Polysaccharide-23 01/14/2021   Tdap 07/01/2014    TDAP status: Up to date  Flu Vaccine  status: Up to date  Pneumococcal vaccine status: Up to date  Covid-19 vaccine status: Information provided on how to obtain vaccines. Booster due  Qualifies for Shingles Vaccine? Yes   Zostavax completed No   Shingrix Completed?: No.    Education has been provided regarding the importance of this vaccine. Patient has been advised to call insurance company to determine out of pocket expense if they have not yet received this vaccine. Advised may also receive vaccine at local pharmacy or Health Dept. Verbalized acceptance and understanding.  Screening Tests Health Maintenance  Topic Date Due   FOOT EXAM  Never done   OPHTHALMOLOGY EXAM  Never done   URINE MICROALBUMIN  Never done   Zoster Vaccines- Shingrix (1 of 2) Never done   COLON CANCER SCREENING ANNUAL FOBT  07/02/2015   COVID-19 Vaccine (4 - Booster for Pfizer series) 09/18/2020   INFLUENZA VACCINE  02/15/2021   HEMOGLOBIN A1C  04/30/2021   MAMMOGRAM  05/20/2022   TETANUS/TDAP  07/01/2024   COLONOSCOPY (Pts 45-40yr Insurance coverage will need to be confirmed)  06/07/2025   DEXA SCAN  Completed   Hepatitis C Screening  Completed  PNA vac Low Risk Adult  Completed   HPV VACCINES  Aged Out    Health Maintenance  Health Maintenance Due  Topic Date Due   FOOT EXAM  Never done   OPHTHALMOLOGY EXAM  Never done   URINE MICROALBUMIN  Never done   Zoster Vaccines- Shingrix (1 of 2) Never done   COLON CANCER SCREENING ANNUAL FOBT  07/02/2015   COVID-19 Vaccine (4 - Booster for Pfizer series) 09/18/2020    Colorectal cancer screening: Type of screening: Colonoscopy. Completed 06/08/2015. Repeat every 7 years  Mammogram status: Completed Bilateral 05/20/2020. Repeat every year  Bone Density status: Completed 02/17/2019. Results reflect: Bone density results: NORMAL. Repeat every 2 years.  Lung Cancer Screening: (Low Dose CT Chest recommended if Age 49-80 years, 30 pack-year currently smoking OR have quit w/in 15years.) does  not qualify.    Additional Screening:  Hepatitis C Screening:  Completed 01/18/2016  Vision Screening: Recommended annual ophthalmology exams for early detection of glaucoma and other disorders of the eye. Is the patient up to date with their annual eye exam?  Yes  Who is the provider or what is the name of the office in which the patient attends annual eye exams? Dr. Aundra Dubin   Dental Screening: Recommended annual dental exams for proper oral hygiene  Community Resource Referral / Chronic Care Management: CRR required this visit?  No   CCM required this visit?  No      Plan:     I have personally reviewed and noted the following in the patient's chart:   Medical and social history Use of alcohol, tobacco or illicit drugs  Current medications and supplements including opioid prescriptions.  Functional ability and status Nutritional status Physical activity Advanced directives List of other physicians Hospitalizations, surgeries, and ER visits in previous 12 months Vitals Screenings to include cognitive, depression, and falls Referrals and appointments  In addition, I have reviewed and discussed with patient certain preventive protocols, quality metrics, and best practice recommendations. A written personalized care plan for preventive services as well as general preventive health recommendations were provided to patient.   Patient to access avs on mychart.  Marta Antu, LPN   08/29/2480  Nurse Health Advisor  Nurse Notes: None

## 2021-02-08 ENCOUNTER — Ambulatory Visit (INDEPENDENT_AMBULATORY_CARE_PROVIDER_SITE_OTHER): Payer: Medicare Other

## 2021-02-08 ENCOUNTER — Other Ambulatory Visit: Payer: Self-pay

## 2021-02-08 VITALS — BP 144/76 | HR 73 | Temp 98.0°F | Resp 16 | Ht 61.5 in | Wt 203.0 lb

## 2021-02-08 DIAGNOSIS — Z0000 Encounter for general adult medical examination without abnormal findings: Secondary | ICD-10-CM

## 2021-02-08 NOTE — Patient Instructions (Signed)
Ms. Ellenbecker , Thank you for taking time to come for your Medicare Wellness Visit. I appreciate your ongoing commitment to your health goals. Please review the following plan we discussed and let me know if I can assist you in the future.   Screening recommendations/referrals: Colonoscopy: Completed 06/08/2015-Follow recommendations given by GI for repeat colonoscopy date. Mammogram: Completed 05/20/2020-Due 05/20/2021 Bone Density: Completed 02/17/2019-Due 02/16/2021 Recommended yearly ophthalmology/optometry visit for glaucoma screening and checkup Recommended yearly dental visit for hygiene and checkup  Vaccinations: Influenza vaccine: Up to date Pneumococcal vaccine: Up to date Tdap vaccine: Up to date-Due 07/01/2024 Shingles vaccine: Discuss with pharmacy   Covid-19:2nd booster due  Advanced directives: Declined information today  Conditions/risks identified: See problem list  Next appointment: Follow up in one year for your annual wellness visit    Preventive Care 72 Years and Older, Female Preventive care refers to lifestyle choices and visits with your health care provider that can promote health and wellness. What does preventive care include? A yearly physical exam. This is also called an annual well check. Dental exams once or twice a year. Routine eye exams. Ask your health care provider how often you should have your eyes checked. Personal lifestyle choices, including: Daily care of your teeth and gums. Regular physical activity. Eating a healthy diet. Avoiding tobacco and drug use. Limiting alcohol use. Practicing safe sex. Taking low-dose aspirin every day. Taking vitamin and mineral supplements as recommended by your health care provider. What happens during an annual well check? The services and screenings done by your health care provider during your annual well check will depend on your age, overall health, lifestyle risk factors, and family history of  disease. Counseling  Your health care provider may ask you questions about your: Alcohol use. Tobacco use. Drug use. Emotional well-being. Home and relationship well-being. Sexual activity. Eating habits. History of falls. Memory and ability to understand (cognition). Work and work Statistician. Reproductive health. Screening  You may have the following tests or measurements: Height, weight, and BMI. Blood pressure. Lipid and cholesterol levels. These may be checked every 5 years, or more frequently if you are over 34 years old. Skin check. Lung cancer screening. You may have this screening every year starting at age 53 if you have a 30-pack-year history of smoking and currently smoke or have quit within the past 15 years. Fecal occult blood test (FOBT) of the stool. You may have this test every year starting at age 35. Flexible sigmoidoscopy or colonoscopy. You may have a sigmoidoscopy every 5 years or a colonoscopy every 10 years starting at age 21. Hepatitis C blood test. Hepatitis B blood test. Sexually transmitted disease (STD) testing. Diabetes screening. This is done by checking your blood sugar (glucose) after you have not eaten for a while (fasting). You may have this done every 1-3 years. Bone density scan. This is done to screen for osteoporosis. You may have this done starting at age 63. Mammogram. This may be done every 1-2 years. Talk to your health care provider about how often you should have regular mammograms. Talk with your health care provider about your test results, treatment options, and if necessary, the need for more tests. Vaccines  Your health care provider may recommend certain vaccines, such as: Influenza vaccine. This is recommended every year. Tetanus, diphtheria, and acellular pertussis (Tdap, Td) vaccine. You may need a Td booster every 10 years. Zoster vaccine. You may need this after age 83. Pneumococcal 13-valent conjugate (PCV13) vaccine. One  dose is recommended after age 35. Pneumococcal polysaccharide (PPSV23) vaccine. One dose is recommended after age 4. Talk to your health care provider about which screenings and vaccines you need and how often you need them. This information is not intended to replace advice given to you by your health care provider. Make sure you discuss any questions you have with your health care provider. Document Released: 07/31/2015 Document Revised: 03/23/2016 Document Reviewed: 05/05/2015 Elsevier Interactive Patient Education  2017 [place] Prevention in the Home Falls can cause injuries. They can happen to people of all ages. There are many things you can do to make your home safe and to help prevent falls. What can I do on the outside of my home? Regularly fix the edges of walkways and driveways and fix any cracks. Remove anything that might make you trip as you walk through a door, such as a raised step or threshold. Trim any bushes or trees on the path to your home. Use bright outdoor lighting. Clear any walking paths of anything that might make someone trip, such as rocks or tools. Regularly check to see if handrails are loose or broken. Make sure that both sides of any steps have handrails. Any raised decks and porches should have guardrails on the edges. Have any leaves, snow, or ice cleared regularly. Use sand or salt on walking paths during winter. Clean up any spills in your garage right away. This includes oil or grease spills. What can I do in the bathroom? Use night lights. Install grab bars by the toilet and in the tub and shower. Do not use towel bars as grab bars. Use non-skid mats or decals in the tub or shower. If you need to sit down in the shower, use a plastic, non-slip stool. Keep the floor dry. Clean up any water that spills on the floor as soon as it happens. Remove soap buildup in the tub or shower regularly. Attach bath mats securely with double-sided  non-slip rug tape. Do not have throw rugs and other things on the floor that can make you trip. What can I do in the bedroom? Use night lights. Make sure that you have a light by your bed that is easy to reach. Do not use any sheets or blankets that are too big for your bed. They should not hang down onto the floor. Have a firm chair that has side arms. You can use this for support while you get dressed. Do not have throw rugs and other things on the floor that can make you trip. What can I do in the kitchen? Clean up any spills right away. Avoid walking on wet floors. Keep items that you use a lot in easy-to-reach places. If you need to reach something above you, use a strong step stool that has a grab bar. Keep electrical cords out of the way. Do not use floor polish or wax that makes floors slippery. If you must use wax, use non-skid floor wax. Do not have throw rugs and other things on the floor that can make you trip. What can I do with my stairs? Do not leave any items on the stairs. Make sure that there are handrails on both sides of the stairs and use them. Fix handrails that are broken or loose. Make sure that handrails are as long as the stairways. Check any carpeting to make sure that it is firmly attached to the stairs. Fix any carpet that is loose or worn. Avoid  having throw rugs at the top or bottom of the stairs. If you do have throw rugs, attach them to the floor with carpet tape. Make sure that you have a light switch at the top of the stairs and the bottom of the stairs. If you do not have them, ask someone to add them for you. What else can I do to help prevent falls? Wear shoes that: Do not have high heels. Have rubber bottoms. Are comfortable and fit you well. Are closed at the toe. Do not wear sandals. If you use a stepladder: Make sure that it is fully opened. Do not climb a closed stepladder. Make sure that both sides of the stepladder are locked into place. Ask  someone to hold it for you, if possible. Clearly mark and make sure that you can see: Any grab bars or handrails. First and last steps. Where the edge of each step is. Use tools that help you move around (mobility aids) if they are needed. These include: Canes. Walkers. Scooters. Crutches. Turn on the lights when you go into a dark area. Replace any light bulbs as soon as they burn out. Set up your furniture so you have a clear path. Avoid moving your furniture around. If any of your floors are uneven, fix them. If there are any pets around you, be aware of where they are. Review your medicines with your doctor. Some medicines can make you feel dizzy. This can increase your chance of falling. Ask your doctor what other things that you can do to help prevent falls. This information is not intended to replace advice given to you by your health care provider. Make sure you discuss any questions you have with your health care provider. Document Released: 04/30/2009 Document Revised: 12/10/2015 Document Reviewed: 08/08/2014 Elsevier Interactive Patient Education  2017 [employer].

## 2021-02-09 ENCOUNTER — Telehealth: Payer: Self-pay

## 2021-02-09 ENCOUNTER — Other Ambulatory Visit: Payer: Self-pay | Admitting: Family Medicine

## 2021-02-09 DIAGNOSIS — L578 Other skin changes due to chronic exposure to nonionizing radiation: Secondary | ICD-10-CM

## 2021-02-09 NOTE — Telephone Encounter (Signed)
Patient is requesting a referral to a dermatologist for multiple skin lesions that she would like to have looked at.

## 2021-02-20 DIAGNOSIS — M5412 Radiculopathy, cervical region: Secondary | ICD-10-CM | POA: Diagnosis not present

## 2021-03-02 ENCOUNTER — Encounter: Payer: Self-pay | Admitting: Family Medicine

## 2021-03-02 ENCOUNTER — Other Ambulatory Visit: Payer: Self-pay

## 2021-03-02 ENCOUNTER — Ambulatory Visit (INDEPENDENT_AMBULATORY_CARE_PROVIDER_SITE_OTHER): Payer: Medicare Other | Admitting: Family Medicine

## 2021-03-02 ENCOUNTER — Encounter: Payer: Self-pay | Admitting: *Deleted

## 2021-03-02 VITALS — BP 120/76 | HR 109 | Temp 98.0°F | Resp 16 | Wt 199.4 lb

## 2021-03-02 DIAGNOSIS — E782 Mixed hyperlipidemia: Secondary | ICD-10-CM | POA: Diagnosis not present

## 2021-03-02 DIAGNOSIS — M722 Plantar fascial fibromatosis: Secondary | ICD-10-CM | POA: Diagnosis not present

## 2021-03-02 DIAGNOSIS — E890 Postprocedural hypothyroidism: Secondary | ICD-10-CM

## 2021-03-02 DIAGNOSIS — I10 Essential (primary) hypertension: Secondary | ICD-10-CM

## 2021-03-02 DIAGNOSIS — K589 Irritable bowel syndrome without diarrhea: Secondary | ICD-10-CM | POA: Diagnosis not present

## 2021-03-02 DIAGNOSIS — G4733 Obstructive sleep apnea (adult) (pediatric): Secondary | ICD-10-CM

## 2021-03-02 DIAGNOSIS — M542 Cervicalgia: Secondary | ICD-10-CM

## 2021-03-02 DIAGNOSIS — Z124 Encounter for screening for malignant neoplasm of cervix: Secondary | ICD-10-CM | POA: Diagnosis not present

## 2021-03-02 DIAGNOSIS — E119 Type 2 diabetes mellitus without complications: Secondary | ICD-10-CM | POA: Diagnosis not present

## 2021-03-02 DIAGNOSIS — E669 Obesity, unspecified: Secondary | ICD-10-CM

## 2021-03-02 MED ORDER — HYOSCYAMINE SULFATE 0.125 MG SL SUBL: 0.1250 mg | SUBLINGUAL_TABLET | SUBLINGUAL | 1 refills | Status: DC | PRN

## 2021-03-02 NOTE — Progress Notes (Signed)
Patient ID: Monica Wallace, female    DOB: [birth year]  Age: 70 y.o. MRN: [id number]    Subjective:  Subjective  HPI Monica Wallace presents for office visit today for follow up on htn and hyperlipidemia. She recently had sinus surgery done by Dr. Benjamine Mola and currently feels a lot better. She also saw Dr. 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 Denies CP/palp/SOB/congestion/fevers or GU c/o. Taking meds as prescribed.  She states that neck pain is her biggest issue. She received injections for the neck pain. She describes pain as being sore and radiating from neck to bilateral shoulders. She sees Dr. Noemi Chapel for her bilateral knee pain and reports getting COVID-19 in 2021. She reports checking her glucose in the weekends ranging from 90-97. She reports having plantar fascitis in left foot.  She has had a hysterectomy at the age of 33 due to adhesion of 2 c-sections.  She reports getting IBS like symptoms like vomiting and spasms. She states that it occurred for 5 times recently. It starts with abdominal pain then spasms, then diarrhea and vomiting.   Review of Systems  Constitutional:  Negative for chills, fatigue and fever.  HENT:  Negative for congestion, rhinorrhea, sinus pressure, sinus pain, sore throat and trouble swallowing.   Eyes:  Negative for pain.  Respiratory:  Negative for cough and shortness of breath.   Cardiovascular:  Negative for chest pain, palpitations and leg swelling.  Gastrointestinal:  Positive for abdominal pain, diarrhea and vomiting. Negative for blood in stool and nausea.  Genitourinary:  Negative for decreased urine volume, flank pain, frequency, vaginal bleeding and vaginal discharge.  Musculoskeletal:  Positive for arthralgias, neck pain and neck stiffness. Negative for back pain.  Neurological:  Positive for numbness (toes of bilateral feet) and headaches (sinus).   History Past Medical History:  Diagnosis Date    Arthritis    Asthma    Baker's cyst of knee, right 02/16/2017   Cancer ([place])    thyroid (2002)   Cystic breast    Diabetes ([place])    GERD (gastroesophageal reflux disease)    Hepatic cyst 02/02/2015   Hyperlipidemia    Hypertension    Insomnia 07/23/2016   Menopause    Migraine    Palpitations    Preventative health care 01/18/2016   Sleep apnea 02/02/2015   Thyroid disease     She has a past surgical history that includes Cholecystectomy; Appendectomy; Cesarean section; Knee Arthroplasty; Carpal tunnel release (Right); Abdominal hysterectomy; Eye surgery (Bilateral, 2017); and Thyroidectomy (2002).   Her family history includes Allergic rhinitis in her brother and sister; Arthritis in her father, mother, and sister; Asthma in her maternal grandfather; Breast cancer (age of onset: 43) in her sister; Cancer in her sister; Cancer (age of onset: 67) in her sister; Dementia in her paternal grandmother; Depression in her maternal grandfather; Diabetes in her father and paternal grandfather; Fibromyalgia in her sister; GER disease in her sister; Gout in her sister; Heart disease in her father; Hyperlipidemia in her father and mother; Hypertension in her brother, father, and mother; Kidney disease in her father; Scoliosis in her daughter; Transient ischemic attack in her mother.She reports that she has never smoked. She has never used smokeless tobacco. She reports current alcohol use. She reports that she does not use drugs.  Current Outpatient Medications on File Prior to Visit  Medication Sig Dispense Refill   acetaminophen (TYLENOL) 325 MG tablet Take 650 mg by  mouth every 6 (six) hours as needed.     albuterol (VENTOLIN HFA) 108 (90 Base) MCG/ACT inhaler INHALE 2 PUFFS INTO THE LUNGS EVERY 6 HOURS AS NEEDED FOR WHEEZING OR SHORTNESS OF BREATH (Patient taking differently: Inhale 2 puffs into the lungs every 6 (six) hours as needed for wheezing or shortness of breath.) 25.5 g 1   Ascorbic Acid  (VITAMIN C) 100 MG tablet Take 100 mg by mouth as needed.      azelastine (ASTELIN) 0.1 % nasal spray Place 1 spray in each nostril twice a day as needed for runny nose/drainage down throat 90 mL 1   benzonatate (TESSALON PERLES) 100 MG capsule Take 1-2 capsules (100-200 mg total) by mouth 3 (three) times daily as needed for cough. 60 capsule 1   Blood Glucose Monitoring Suppl ([place]) w/Device KIT Use to check blood glucose once a day.  DX code: E11.9 1 kit 0   budesonide (PULMICORT) 180 MCG/ACT inhaler Inhale 1-2 puffs into the lungs 2 (two) times daily. 3 each 1   Cholecalciferol ([employer] VIT D 5000 HIGH-POTENCY PO) Take 5,000 Units by mouth.     fluconazole (DIFLUCAN) 150 MG tablet Take 1 tablet (150 mg total) by mouth once a week. 2 tablet 1   fluticasone (FLONASE) 50 MCG/ACT nasal spray Place 2 sprays into both nostrils daily. 16 g 6   glucose blood (ONETOUCH VERIO) test strip Use to check blood glucose once a day.  DX code: E11.9 100 each 1   Lancets (ONETOUCH DELICA PLUS [id number]) MISC Use to check blood glucose once a day.  DX code: E11.9 100 each 1   loratadine (CLARITIN) 10 MG tablet Take 10 mg by mouth in the morning and at bedtime.      montelukast (SINGULAIR) 10 MG tablet TAKE 1 TABLET(10 MG) BY MOUTH DAILY AS NEEDED 90 tablet 1   Omega-3 1000 MG CAPS Take 1,000 mg by mouth daily.     omeprazole (PRILOSEC) 40 MG capsule Take 1 capsule by mouth daily.     rosuvastatin (CRESTOR) 5 MG tablet TAKE 1 TABLET BY MOUTH 2 TIMES A WEEK(TUESDAYS AND SUNDAYS WEEKLY) 25 tablet 3   SYNTHROID 150 MCG tablet TAKE 1 TABLET(150 MCG) BY MOUTH DAILY BEFORE AND BREAKFAST 90 tablet 0   triamterene-hydrochlorothiazide (MAXZIDE) 75-50 MG tablet TAKE 1 TABLET BY MOUTH DAILY 90 tablet 0   No current facility-administered medications on file prior to visit.     Objective:  Objective  Physical Exam Constitutional:      General: She is not in acute distress.    Appearance: Normal  appearance. She is not ill-appearing or toxic-appearing.  HENT:     Head: Normocephalic and atraumatic.     Right Ear: Tympanic membrane, ear canal and external ear normal.     Left Ear: Tympanic membrane, ear canal and external ear normal.     Nose: No congestion or rhinorrhea.  Eyes:     Extraocular Movements: Extraocular movements intact.     Right eye: No nystagmus.     Left eye: No nystagmus.     Pupils: Pupils are equal, round, and reactive to light.  Cardiovascular:     Rate and Rhythm: Normal rate and regular rhythm.     Pulses: Normal pulses.     Heart sounds: Normal heart sounds. No murmur heard. Pulmonary:     Effort: Pulmonary effort is normal. No respiratory distress.     Breath sounds: Normal breath sounds. No wheezing, rhonchi  or rales.  Abdominal:     General: Bowel sounds are normal.     Palpations: Abdomen is soft. There is no mass.     Tenderness: no abdominal tenderness There is no guarding.     Hernia: No hernia is present.  Musculoskeletal:        General: Normal range of motion.     Cervical back: Normal range of motion and neck supple.  Skin:    General: Skin is warm and dry.  Neurological:     Mental Status: She is alert and oriented to person, place, and time.     Motor: Motor function is intact. No weakness.  Psychiatric:        Behavior: Behavior normal.   BP 120/76   Pulse (!) 109   Temp 98 F (36.7 C)   Resp 16   Wt 199 lb 6.4 oz (90.4 kg)   SpO2 99%   BMI 37.07 kg/m  Wt Readings from Last 3 Encounters:  03/02/21 199 lb 6.4 oz (90.4 kg)  02/08/21 203 lb (92.1 kg)  01/26/21 200 lb 1.6 oz (90.8 kg)     Lab Results  Component Value Date   WBC 9.9 03/02/2021   HGB 13.1 03/02/2021   HCT 40.0 03/02/2021   PLT 294.0 03/02/2021   GLUCOSE 110 (H) 03/05/2021   CHOL 199 03/02/2021   TRIG 164.0 (H) 03/02/2021   HDL 52.10 03/02/2021   LDLDIRECT 86.0 10/29/2020   LDLCALC 114 (H) 03/02/2021   ALT 21 03/05/2021   AST 23 03/05/2021   NA 140  03/05/2021   K 3.5 03/05/2021   CL 98 03/05/2021   CREATININE 0.93 03/05/2021   BUN 19 03/05/2021   CO2 32 03/05/2021   TSH 0.77 03/02/2021   HGBA1C 6.4 03/02/2021    CT MAXILLOFACIAL WO CONTRAST  Result Date: 11/30/2020 CLINICAL DATA:  Chronic sinusitis EXAM: CT MAXILLOFACIAL WITHOUT CONTRAST TECHNIQUE: Multidetector CT images of the paranasal sinuses were obtained using the standard protocol without intravenous contrast. COMPARISON:  None. FINDINGS: Paranasal sinuses: Frontal: Mild mucosal edema in the frontal sinus recess bilaterally Ethmoid: Mild mucosal edema bilaterally. Maxillary: Mild mucosal edema bilaterally. Small air-fluid level bilaterally. Sphenoid: Mild mucosal edema Mastoids and middle ear: Clear bilaterally. Right ostiomeatal unit: Occluded Left ostiomeatal unit: Occluded Nasal passages: Patent. Intact nasal septum is midline. Anatomy: Keros type 2 olfactory recess Moderate degenerative change in the right TMJ Advanced degenerative change in the cervical spine Limited intracranial imaging negative Negative orbit bilaterally. IMPRESSION: Mild mucosal edema paranasal sinuses with air-fluid levels in the maxillary sinus bilaterally. Occlusion of the ostiomeatal complex bilaterally. Electronically Signed   By: Franchot Gallo M.D.   On: 11/30/2020 11:14     Assessment & Plan:  Plan    Meds ordered this encounter  Medications   hyoscyamine (LEVSIN SL) 0.125 MG SL tablet    Sig: Place 1 tablet (0.125 mg total) under the tongue every 4 (four) hours as needed.    Dispense:  30 tablet    Refill:  1     Problem List Items Addressed This Visit     Hyperlipidemia    Tolerating statin, encouraged heart healthy diet, avoid trans fats, minimize simple carbs and saturated fats. Increase exercise as tolerated      Relevant Orders   Lipid panel (Completed)   HTN (hypertension)    Well controlled, no changes to meds. Encouraged heart healthy diet such as the DASH diet and exercise  as tolerated.  Relevant Orders   CBC (Completed)   TSH (Completed)   Hypothyroidism    On Levothyroxine, continue to monitor      Irritable bowel syndrome    Avoid offending foods, start probiotics. Do not eat large meals in late evening and consider raising head of bed. Follow up with gastroenterology      Relevant Medications   hyoscyamine (LEVSIN SL) 0.125 MG SL tablet   Plantar fasciitis, bilateral    Referred to podiatry for further evaluation. Ice and stretching      OSA (obstructive sleep apnea)    Sleeping well but not using CPAP due to recurrent sinusitis  Is willing to reconsider sleep study      Relevant Orders   Ambulatory referral to Pulmonology   Obesity (BMI 30-39.9)    Encouraged DASH or MIND diet, decrease po intake and increase exercise as tolerated. Needs 7-8 hours of sleep nightly. Avoid trans fats, eat small, frequent meals every 4-5 hours with lean proteins, complex carbs and healthy fats. Minimize simple carbs, high fat foods and processed foods.      Relevant Orders   Ambulatory referral to Podiatry   Controlled type 2 diabetes mellitus without complication, without long-term current use of insulin (HCC)    hgba1c acceptable, minimize simple carbs. Increase exercise as tolerated. Continue current meds      Relevant Orders   Comprehensive metabolic panel (Completed)   Hemoglobin A1c (Completed)   Ambulatory referral to Podiatry   Neck pain - Primary    Encouraged moist heat and gentle stretching as tolerated. May try NSAIDs and prescription meds as directed and report if symptoms worsen or seek immediate care. Follow up with ortho      Relevant Orders   Ambulatory referral to Physical Therapy   Cervical cancer screening    Patient requests OB/GYN for well woman care.       Relevant Orders   Ambulatory referral to Obstetrics / Gynecology    Follow-up: Return in about 6 months (around 09/02/2021).  I, Suezanne Jacquet, acting as a scribe for  [name], MD, have documented all relevent documentation on behalf of [name], MD, as directed by [name], MD while in the presence of [name], MD.  I, [name], MD personally performed the services described in this documentation. All medical record entries made by the scribe were at my direction and in my presence. I have reviewed the chart and agree that the record reflects my personal performance and is accurate and complete

## 2021-03-02 NOTE — Assessment & Plan Note (Signed)
Well controlled, no changes to meds. Encouraged heart healthy diet such as the DASH diet and exercise as tolerated.  °

## 2021-03-02 NOTE — Assessment & Plan Note (Signed)
Tolerating statin, encouraged heart healthy diet, avoid trans fats, minimize simple carbs and saturated fats. Increase exercise as tolerated 

## 2021-03-02 NOTE — Assessment & Plan Note (Signed)
On Levothyroxine, continue to monitor 

## 2021-03-02 NOTE — Assessment & Plan Note (Signed)
hgba1c acceptable, minimize simple carbs. Increase exercise as tolerated. Continue current meds 

## 2021-03-02 NOTE — Assessment & Plan Note (Signed)
Encouraged DASH or MIND diet, decrease po intake and increase exercise as tolerated. Needs 7-8 hours of sleep nightly. Avoid trans fats, eat small, frequent meals every 4-5 hours with lean proteins, complex carbs and healthy fats. Minimize simple carbs, high fat foods and processed foods 

## 2021-03-02 NOTE — Patient Instructions (Addendum)
Paxlovid is the new COVID medication we can give you if you get COVID so make sure you test if you have symptoms because we have to treat by day 5 of symptoms for it to be effective. If you are positive let us know so we can treat. If a home test is negative and your symptoms are persistent get a PCR test. Can check testing locations at [email or web address] If you are positive we will make an appointment with [place] and we will send in Paxlovid if you would like it. Check with your pharmacy before we meet to confirm they have it in stock, if they do not then we can get the prescription at the [facility] (DASH + Mediterranean diet)  Shingrix is the new shingles shot, 2 shots over 2-6 months, confirm coverage with insurance and document, then can return here for shots with nurse appt or at pharmacy Diabetes Mellitus and Nutrition, Adult When you have diabetes, or diabetes mellitus, it is very important to have healthy eating habits because your blood sugar (glucose) levels are greatly affected by what you eat and drink. Eating healthy foods in the right amounts, at about the same times every day, can help you: Control your blood glucose. Lower your risk of heart disease. Improve your blood pressure. Reach or maintain a healthy weight. What can affect my meal plan? Every person with diabetes is different, and each person has different needs for a meal plan. Your health care provider may recommend that you work with a dietitian to make a meal plan that is best for you. Your meal plan may vary depending on factors such as: The calories you need. The medicines you take. Your weight. Your blood glucose, blood pressure, and cholesterol levels. Your activity level. Other health conditions you have, such as heart or kidney disease. How do carbohydrates affect me? Carbohydrates, also called carbs, affect your blood glucose level more than any other type of food. Eating carbs naturally  raises the amount of glucose in your blood. Carb counting is a method for keeping track of how many carbs you eat. Counting carbs is important to keep your blood glucose at a healthy level,especially if you use insulin or take certain oral diabetes medicines. It is important to know how many carbs you can safely have in each meal. This is different for every person. Your dietitian can help you calculate how manycarbs you should have at each meal and for each snack. How does alcohol affect me? Alcohol can cause a sudden decrease in blood glucose (hypoglycemia), especially if you use insulin or take certain oral diabetes medicines. Hypoglycemia can be a life-threatening condition. Symptoms of hypoglycemia, such as sleepiness, dizziness, and confusion, are similar to symptoms of having too much alcohol. Do not drink alcohol if: Your health care provider tells you not to drink. You are pregnant, may be pregnant, or are planning to become pregnant. If you drink alcohol: Do not drink on an empty stomach. Limit how much you use to: 0-1 drink a day for women. 0-2 drinks a day for men. Be aware of how much alcohol is in your drink. In the [place] one drink equals one 12 oz bottle of beer (355 mL), one 5 oz glass of wine (148 mL), or one 1 oz glass of hard liquor (44 mL). Keep yourself hydrated with water, diet soda, or unsweetened iced tea. Keep in mind that regular soda, juice, and other mixers may contain a lot  of sugar and must be counted as carbs. What are tips for following this plan?  Reading food labels Start by checking the serving size on the "Nutrition Facts" label of packaged foods and drinks. The amount of calories, carbs, fats, and other nutrients listed on the label is based on one serving of the item. Many items contain more than one serving per package. Check the total grams (g) of carbs in one serving. You can calculate the number of servings of carbs in one serving by dividing the total  carbs by 15. For example, if a food has 30 g of total carbs per serving, it would be equal to 2 servings of carbs. Check the number of grams (g) of saturated fats and trans fats in one serving. Choose foods that have a low amount or none of these fats. Check the number of milligrams (mg) of salt (sodium) in one serving. Most people should limit total sodium intake to less than 2,300 mg per day. Always check the nutrition information of foods labeled as "low-fat" or "nonfat." These foods may be higher in added sugar or refined carbs and should be avoided. Talk to your dietitian to identify your daily goals for nutrients listed on the label. Shopping Avoid buying canned, pre-made, or processed foods. These foods tend to be high in fat, sodium, and added sugar. Shop around the outside edge of the grocery store. This is where you will most often find fresh fruits and vegetables, bulk grains, fresh meats, and fresh dairy. Cooking Use low-heat cooking methods, such as baking, instead of high-heat cooking methods like deep frying. Cook using healthy oils, such as olive, canola, or sunflower oil. Avoid cooking with butter, cream, or high-fat meats. Meal planning Eat meals and snacks regularly, preferably at the same times every day. Avoid going long periods of time without eating. Eat foods that are high in fiber, such as fresh fruits, vegetables, beans, and whole grains. Talk with your dietitian about how many servings of carbs you can eat at each meal. Eat 4-6 oz (112-168 g) of lean protein each day, such as lean meat, chicken, fish, eggs, or tofu. One ounce (oz) of lean protein is equal to: 1 oz (28 g) of meat, chicken, or fish. 1 egg.  cup (62 g) of tofu. Eat some foods each day that contain healthy fats, such as avocado, nuts, seeds, and fish. What foods should I eat? Fruits Berries. Apples. Oranges. Peaches. Apricots. Plums. Grapes. Mango. Papaya.Pomegranate. Kiwi.  Cherries. Vegetables Lettuce. Spinach. Leafy greens, including kale, chard, collard greens, and mustard greens. Beets. Cauliflower. Cabbage. Broccoli. Carrots. Green beans.Tomatoes. Peppers. Onions. Cucumbers. Brussels sprouts. Grains Whole grains, such as whole-wheat or whole-grain bread, crackers, tortillas,cereal, and pasta. Unsweetened oatmeal. Quinoa. Brown or wild rice. Meats and other proteins Seafood. Poultry without skin. Lean cuts of poultry and beef. Tofu. Nuts. Seeds. Dairy Low-fat or fat-free dairy products such as milk, yogurt, and cheese. The items listed above may not be a complete list of foods and beverages you can eat. Contact a dietitian for more information. What foods should I avoid? Fruits Fruits canned with syrup. Vegetables Canned vegetables. Frozen vegetables with butter or cream sauce. Grains Refined white flour and flour products such as bread, pasta, snack foods, andcereals. Avoid all processed foods. Meats and other proteins Fatty cuts of meat. Poultry with skin. Breaded or fried meats. Processed meat.Avoid saturated fats. Dairy Full-fat yogurt, cheese, or milk. Beverages Sweetened drinks, such as soda or iced tea. The items listed  above may not be a complete list of foods and beverages you should avoid. Contact a dietitian for more information. Questions to ask a health care provider Do I need to meet with a diabetes educator? Do I need to meet with a dietitian? What number can I call if I have questions? When are the best times to check my blood glucose? Where to find more information: American Diabetes Association: diabetes.org Academy of Nutrition and Dietetics: www.eatright.[employer] of Diabetes and Digestive and Kidney Diseases: [employer] Association of Diabetes Care and Education Specialists: www.diabeteseducator.org Summary It is important to have healthy eating habits because your blood sugar (glucose) levels are greatly  affected by what you eat and drink. A healthy meal plan will help you control your blood glucose and maintain a healthy lifestyle. Your health care provider may recommend that you work with a dietitian to make a meal plan that is best for you. Keep in mind that carbohydrates (carbs) and alcohol have immediate effects on your blood glucose levels. It is important to count carbs and to use alcohol carefully. This information is not intended to replace advice given to you by your health care provider. Make sure you discuss any questions you have with your healthcare provider. Document Revised: 06/11/2019 Document Reviewed: 06/11/2019 Elsevier Patient Education  2021 [employer].

## 2021-03-02 NOTE — Assessment & Plan Note (Signed)
Sleeping well but not using CPAP due to recurrent sinusitis  Is willing to reconsider sleep study

## 2021-03-03 ENCOUNTER — Telehealth: Payer: Self-pay

## 2021-03-03 DIAGNOSIS — E876 Hypokalemia: Secondary | ICD-10-CM

## 2021-03-03 LAB — LIPID PANEL
Cholesterol: 199 mg/dL (ref 0–200)
HDL: 52.1 mg/dL (ref >39.00)
LDL Cholesterol: 114 mg/dL — ABNORMAL HIGH (ref 0–99)
NonHDL: 146.66
Total CHOL/HDL Ratio: 4
Triglycerides: 164 mg/dL — ABNORMAL HIGH (ref 0.0–149.0)
VLDL: 32.8 mg/dL (ref 0.0–40.0)

## 2021-03-03 LAB — CBC
HCT: 40 % (ref 36.0–46.0)
Hemoglobin: 13.1 g/dL (ref 12.0–15.0)
MCHC: 32.8 g/dL (ref 30.0–36.0)
MCV: 91 fl (ref 78.0–100.0)
Platelets: 294 10*3/uL (ref 150.0–400.0)
RBC: 4.4 Mil/uL (ref 3.87–5.11)
RDW: 13.2 % (ref 11.5–15.5)
WBC: 9.9 10*3/uL (ref 4.0–10.5)

## 2021-03-03 LAB — COMPREHENSIVE METABOLIC PANEL
ALT: 21 U/L (ref 0–35)
AST: 24 U/L (ref 0–37)
Albumin: 4.2 g/dL (ref 3.5–5.2)
Alkaline Phosphatase: 77 U/L (ref 39–117)
BUN: 21 mg/dL (ref 6–23)
CO2: 30 mEq/L (ref 19–32)
Calcium: 9.9 mg/dL (ref 8.4–10.5)
Chloride: 96 mEq/L (ref 96–112)
Creatinine, Ser: 0.95 mg/dL (ref 0.40–1.20)
GFR: 60.06 mL/min (ref >60.00)
Glucose, Bld: 87 mg/dL (ref 70–99)
Potassium: 3.2 mEq/L — ABNORMAL LOW (ref 3.5–5.1)
Sodium: 138 mEq/L (ref 135–145)
Total Bilirubin: 0.3 mg/dL (ref 0.2–1.2)
Total Protein: 7.3 g/dL (ref 6.0–8.3)

## 2021-03-03 LAB — HEMOGLOBIN A1C: Hgb A1c MFr Bld: 6.4 % (ref 4.6–6.5)

## 2021-03-03 LAB — TSH: TSH: 0.77 u[IU]/mL (ref 0.35–5.50)

## 2021-03-05 ENCOUNTER — Other Ambulatory Visit: Payer: Self-pay

## 2021-03-05 ENCOUNTER — Other Ambulatory Visit (INDEPENDENT_AMBULATORY_CARE_PROVIDER_SITE_OTHER): Payer: Medicare Other

## 2021-03-05 ENCOUNTER — Other Ambulatory Visit (HOSPITAL_BASED_OUTPATIENT_CLINIC_OR_DEPARTMENT_OTHER): Payer: Self-pay

## 2021-03-05 DIAGNOSIS — E876 Hypokalemia: Secondary | ICD-10-CM

## 2021-03-05 NOTE — Telephone Encounter (Signed)
Changed lab order to [facility]

## 2021-03-05 NOTE — Addendum Note (Signed)
Addended by: Manuela Schwartz on: 03/05/2021 03:19 PM   Modules accepted: Orders

## 2021-03-05 NOTE — Addendum Note (Signed)
Addended by: Manuela Schwartz on: 03/05/2021 03:18 PM   Modules accepted: Orders

## 2021-03-06 LAB — COMPREHENSIVE METABOLIC PANEL
AG Ratio: 1.5 (calc) (ref 1.0–2.5)
ALT: 21 U/L (ref 6–29)
AST: 23 U/L (ref 10–35)
Albumin: 4 g/dL (ref 3.6–5.1)
Alkaline phosphatase (APISO): 74 U/L (ref 37–153)
BUN: 19 mg/dL (ref 7–25)
CO2: 32 mmol/L (ref 20–32)
Calcium: 9.4 mg/dL (ref 8.6–10.4)
Chloride: 98 mmol/L (ref 98–110)
Creat: 0.93 mg/dL (ref 0.60–1.00)
Globulin: 2.6 g/dL (calc) (ref 1.9–3.7)
Glucose, Bld: 110 mg/dL — ABNORMAL HIGH (ref 65–99)
Potassium: 3.5 mmol/L (ref 3.5–5.3)
Sodium: 140 mmol/L (ref 135–146)
Total Bilirubin: 0.2 mg/dL (ref 0.2–1.2)
Total Protein: 6.6 g/dL (ref 6.1–8.1)

## 2021-03-07 DIAGNOSIS — Z124 Encounter for screening for malignant neoplasm of cervix: Secondary | ICD-10-CM | POA: Insufficient documentation

## 2021-03-07 NOTE — Assessment & Plan Note (Signed)
Avoid offending foods, start probiotics. Do not eat large meals in late evening and consider raising head of bed. Follow up with gastroenterology

## 2021-03-07 NOTE — Assessment & Plan Note (Signed)
Patient requests OB/GYN for well woman care.

## 2021-03-07 NOTE — Assessment & Plan Note (Signed)
Referred to podiatry for further evaluation. Ice and stretching

## 2021-03-07 NOTE — Assessment & Plan Note (Signed)
Encouraged moist heat and gentle stretching as tolerated. May try NSAIDs and prescription meds as directed and report if symptoms worsen or seek immediate care. Follow up with ortho

## 2021-03-12 DIAGNOSIS — M5030 Other cervical disc degeneration, unspecified cervical region: Secondary | ICD-10-CM | POA: Diagnosis not present

## 2021-03-12 DIAGNOSIS — M791 Myalgia, unspecified site: Secondary | ICD-10-CM | POA: Diagnosis not present

## 2021-03-30 ENCOUNTER — Other Ambulatory Visit: Payer: Self-pay

## 2021-03-30 ENCOUNTER — Encounter: Payer: Self-pay | Admitting: Physical Therapy

## 2021-03-30 ENCOUNTER — Ambulatory Visit: Payer: Medicare Other | Attending: Family Medicine | Admitting: Physical Therapy

## 2021-03-30 DIAGNOSIS — M542 Cervicalgia: Secondary | ICD-10-CM | POA: Insufficient documentation

## 2021-03-30 DIAGNOSIS — M6281 Muscle weakness (generalized): Secondary | ICD-10-CM | POA: Insufficient documentation

## 2021-03-30 DIAGNOSIS — R29898 Other symptoms and signs involving the musculoskeletal system: Secondary | ICD-10-CM | POA: Insufficient documentation

## 2021-03-30 DIAGNOSIS — M62838 Other muscle spasm: Secondary | ICD-10-CM | POA: Diagnosis not present

## 2021-03-30 DIAGNOSIS — R293 Abnormal posture: Secondary | ICD-10-CM | POA: Diagnosis not present

## 2021-03-30 NOTE — Patient Instructions (Signed)
     Access Code: [email or web address] URL: [email or web address] Date: 03/30/2021 Prepared by: Annie Paras  Exercises Seated Cervical Retraction - 2 x daily - 7 x weekly - 2 sets - 10 reps - 3-5 sec hold Seated Scapular Retraction - 3 x daily - 7 x weekly - 2 sets - 10 reps - 5 sec hold Doorway Pec Stretch at 60 Degrees Abduction with Arm Straight - 2-3 x daily - 7 x weekly - 3 reps - 30 sec hold Single Arm Doorway Pec Stretch at 90 Degrees Abduction - 2-3 x daily - 7 x weekly - 3 reps - 30 sec hold Doorway Pec Stretch at 90 Elevation with Arm Straight - 2-3 x daily - 7 x weekly - 3 reps - 30 sec hold

## 2021-03-30 NOTE — Therapy (Signed)
[facility] [address]  [place], [place], [address] Phone: [phone]   Fax:  [phone]  Physical Therapy Evaluation  Patient Details  Name: Monica Wallace MRN: [id number] Date of Birth: 1951/05/20 Referring Provider (PT): [name], MD   Encounter Date: 03/30/2021   PT End of Session - 03/30/21 1447     Visit Number 1    Number of Visits 16    Date for PT Re-Evaluation 05/25/21    Authorization Type Medicare & [employer]    PT Start Time 1447    PT Stop Time 1537    PT Time Calculation (min) 50 min    Activity Tolerance Patient tolerated treatment well    Behavior During Therapy [facility] for tasks assessed/performed             Past Medical History:  Diagnosis Date   Arthritis    Asthma    Baker's cyst of knee, right 02/16/2017   Cancer ([place])    thyroid (2002)   Cystic breast    Diabetes ([place])    GERD (gastroesophageal reflux disease)    Hepatic cyst 02/02/2015   Hyperlipidemia    Hypertension    Insomnia 07/23/2016   Menopause    Migraine    Palpitations    Preventative health care 01/18/2016   Sleep apnea 02/02/2015   Thyroid disease     Past Surgical History:  Procedure Laterality Date   ABDOMINAL HYSTERECTOMY     took right ovary and uterus   APPENDECTOMY     CARPAL TUNNEL RELEASE Right    CESAREAN SECTION     CHOLECYSTECTOMY     EYE SURGERY Bilateral 2017   cataracts   KNEE ARTHROPLASTY     THYROIDECTOMY  2002    There were no vitals filed for this visit.    Subjective Assessment - 03/30/21 1452     Subjective Pt reports initially over 1 yr ago, she was having pain behind her R ear - initially thought to have mastoiditis but was told it was more likely coming from her spine. Imaging revealed spinal stenosis. Has had multiple injections with some relief but still having excessive muscle tension. Looking down to document for work makes it worse,    Limitations  Sitting;Writing;House hold activities    How long can you sit comfortably? 1 hr    Diagnostic tests 02/22/20 -cervical MRI: 1. Moderate right C3-4 and C4-5 neural foraminal stenosis.  2. Moderate left C5-6 neural foraminal stenosis.  3. No spinal canal stenosis.    Patient Stated Goals "find ways to relax the muscles more and have less pain so I can sleep better"    Currently in Pain? Yes    Pain Score 2    up to 9-10/10 at worst   Pain Location Neck    Pain Orientation Right;Lateral;Upper    Pain Descriptors / Indicators Throbbing;Sore;Other (Comment)   "gnawing"   Pain Type Chronic pain    Pain Radiating Towards possible radiation into R shoulder & lower arm vs isolated shoulder/arm pain; tension into skull but seldom has headaches and usually attributes headaches to her sinuses    Pain Onset Other (comment)   ~2 yrs   Aggravating Factors  laying on R side (favorite side to sleep), turning head    Pain Relieving Factors heat, CBD oil, Aspercreme with lidocaine    Effect of Pain on Daily Activities limited neck ROM, difficulty sitting to try and relax  [facility] PT Assessment - 03/30/21 1447       Assessment   Medical Diagnosis Neck pain    Referring Provider (PT) [name], MD    Onset Date/Surgical Date --   ~2 yrs   Hand Dominance Right    Next MD Visit Feb 2023    Prior Therapy none; PT following L RCR      Precautions   Precautions None      Restrictions   Weight Bearing Restrictions No      Balance Screen   Has the patient fallen in the past 6 months No    Has the patient had a decrease in activity level because of a fear of falling?  No    Is the patient reluctant to leave their home because of a fear of falling?  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    Leisure household chores, assisting with care for elderly mother      Cognition   Overall Cognitive Status Within Functional Limits for tasks assessed      Observation/Other Assessments   Focus on Therapeutic Outcomes (FOTO)  Neck = 61; predicted D/C FS = 66      Posture/Postural Control   Posture/Postural Control Postural limitations    Postural Limitations Forward head;Rounded Shoulders    Posture Comments R shoulder protracted & depressed      ROM / Strength   AROM / PROM / Strength AROM;Strength      AROM   Overall AROM Comments B shoulder ROM WNL    AROM Assessment Site Cervical;Shoulder    Cervical Flexion 55    Cervical Extension 30    Cervical - Right Side Bend 18    Cervical - Left Side Bend 22    Cervical - Right Rotation 42    Cervical - Left Rotation 30      Strength   Strength Assessment Site Shoulder    Right/Left Shoulder Right;Left    Right Shoulder Flexion 3+/5   pain   Right Shoulder ABduction 4/5    Right Shoulder Internal Rotation 4/5    Right Shoulder External Rotation 3+/5    Left Shoulder Flexion 4/5    Left Shoulder Extension 4/5    Left Shoulder ABduction 4/5    Left Shoulder Internal Rotation 4-/5      Palpation   Palpation comment increased muscle tension and TTP in R>L UT, LS cervical paraspinals, suboccipitals, rhomboids & pecs                        Objective measurements completed on examination: See above findings.       [facility] Adult PT Treatment/Exercise - 03/30/21 1447       Exercises   Exercises Neck      Neck Exercises: Seated   Neck Retraction 10 reps;5 secs    Other Seated Exercise Scap retraction & depression 10 x 5"      Neck Exercises: Stretches   Other Neck Stretches R low and mid doorway pec  stretches x 30 sec each                      PT Education - 03/30/21 1534     Education Details PT eval findings, anticipated POC & initial HEP - Access Code: [email or web address]    Person(s) Educated Patient    Methods Explanation;Demonstration;Verbal cues;Handout    Comprehension Verbalized understanding;Verbal cues required;Returned demonstration;Need further instruction              PT Short Term Goals - 03/30/21 1537       PT SHORT TERM GOAL #1   Title Patient will be independent with initial HEP    Status New    Target Date 04/27/21               PT Long Term Goals - 03/30/21 1537       PT LONG TERM GOAL #1   Title Patient will be independent with ongoing/advanced HEP for self-management at home    Status New      PT LONG TERM GOAL #2   Title Improve posture and alignment with patient to demonstrate improved upright posture with posterior shoulder girdle engaged    Status New    Target Date 05/25/21      PT LONG TERM GOAL #3   Title Decrease R sided neck pain by >/= 50-75% allowing patient increased ease of cervical ROM and improved comfort in preferred sleeping position    Status New    Target Date 05/25/21      PT LONG TERM GOAL #4   Title Patient to improve cervical AROM to [facility] without pain provocation    Status New    Target Date 05/25/21      PT LONG TERM GOAL #5   Title Patient will demonstrate improved B shoulder strength to >/= 4+/5 for functional UE use    Status New    Target Date 05/25/21      PT LONG TERM GOAL #6   Title Patient will report no sleep disturbance due to R sided neck pain    Status New    Target Date 05/25/21                    Plan - 03/30/21 1537     Clinical Impression Statement Monica Wallace is a 70 y/o female who presents to OP PT for chronic neck pain originating ~2 yrs ago. She reports gradual onset and worsening of pain starting behind her R ear, which she originally though to be mastoiditis but was told was more likely coming from her neck. Pain  now interferes with neck ROM as well as ability to find a comfortable position to relax while sitting or sleeping on her R side (preferred sleeping position). Current deficits include R sided neck pain, postural abnormalities including forward head and excessively rounded and depressed R shoulder, limited and painful cervical AROM, R shoulder weakness, increased soft tissue restriction and TTP in R>L UT and LS, pec, and cervical paraspinals. Angelic will benefit from skilled PT to address above deficits to improve posture, restore pain free functional cervical ROM and to improve tolerance for normal daily and work activities.    Personal Factors and Comorbidities Age;Behavior Pattern;Comorbidity 3+;Fitness;Profession;Past/Current Experience;Time since onset of injury/illness/exacerbation    Comorbidities HTN, migraine, asthma, sinusitis, pre-diabetes/DM-II, GERD, OSA, thyroid cancer s/p thyroidectomy 2002, LBP with L sciatica, DJD, OA, insomnia, R CTR    Examination-Activity Limitations Caring for Others;Lift;Carry;Sit    Examination-Participation  Restrictions Church;Cleaning;Community Activity;Driving;Laundry;Occupation;School;Shop;Volunteer    Stability/Clinical Decision Making Stable/Uncomplicated    Clinical Decision Making Low    Rehab Potential Good    PT Frequency 2x / week    PT Duration 8 weeks    PT Treatment/Interventions ADLs/Self Care Home Management;Cryotherapy;Electrical Stimulation;Iontophoresis '4mg'$ /ml Dexamethasone;Moist Heat;Traction;Ultrasound;Functional mobility training;Therapeutic activities;Therapeutic exercise;Neuromuscular re-education;Patient/family education;Manual techniques;Passive range of motion;Dry needling;Taping;Spinal Manipulations;Joint Manipulations    PT Next Visit Plan Review initial HEP; postural awareness and stretching; progress postural strengthening and update HEP; manual therapy & modalities PRN for pain and increased muscle tension    PT Home Exercise Plan  Access Code: [email or web address] (9/13)    Consulted and Agree with Plan of Care Patient             Patient will benefit from skilled therapeutic intervention in order to improve the following deficits and impairments:  Decreased activity tolerance, Decreased endurance, Decreased knowledge of precautions, Decreased mobility, Decreased range of motion, Decreased strength, Increased fascial restricitons, Increased muscle spasms, Impaired perceived functional ability, Impaired flexibility, Impaired UE functional use, Improper body mechanics, Postural dysfunction, Pain  Visit Diagnosis: Cervicalgia  Abnormal posture  Muscle weakness (generalized)  Other symptoms and signs involving the musculoskeletal system  Other muscle spasm     Problem List Patient Active Problem List   Diagnosis Date Noted   Cervical cancer screening 03/07/2021   Sinusitis 10/30/2020   Allergic conjunctivitis 04/08/2020   Neck pain 02/06/2020   Acute bronchitis 01/13/2020   Otitis media 09/30/2019   Hip pain, right 09/30/2019   COVID-19 09/30/2019   Educated about COVID-19 virus infection 12/05/2018   High risk heterosexual behavior 11/20/2017   Baker's cyst of knee, right 02/16/2017   Insomnia 07/23/2016   Hypokalemia 07/23/2016   Controlled type 2 diabetes mellitus without complication, without long-term current use of insulin ([place]) 01/24/2016   Preventative health care 01/18/2016   Left-sided low back pain with left-sided sciatica 10/07/2015   Obesity (BMI 30-39.9) 06/05/2015   OSA (obstructive sleep apnea) 02/02/2015   Palpitations 09/19/2014   Plantar fasciitis, bilateral 11/19/2013   Pronation deformity of ankle, acquired 11/19/2013   DJD (degenerative joint disease) 08/26/2013   Fibrocystic breast 08/26/2013   HTN (hypertension) 08/26/2013   GERD (gastroesophageal reflux disease) 08/26/2013   S/P hysterectomy with oophorectomy 08/26/2013   Hypothyroidism 08/26/2013   Chronic hoarseness 08/26/2013    Colon polyp 08/26/2013   Hiatal hernia 08/26/2013   Irritable bowel syndrome 08/26/2013   Moderate persistent asthma 08/26/2013   Allergic rhinoconjunctivitis 08/26/2013   Hyperlipidemia 08/22/2013   Migraine 08/22/2013   Thyroid cancer ([place]) 08/22/2013    Percival Spanish, PT 03/30/2021, 7:00 PM  [facility] [address]  [place], [place], [address] Phone: [phone]   Fax:  [phone]  Name: Monica Wallace MRN: [id number] Date of Birth: Sep 01, 1950

## 2021-04-01 ENCOUNTER — Ambulatory Visit: Payer: Medicare Other

## 2021-04-02 ENCOUNTER — Other Ambulatory Visit: Payer: Self-pay | Admitting: Family Medicine

## 2021-04-03 DIAGNOSIS — Z20822 Contact with and (suspected) exposure to covid-19: Secondary | ICD-10-CM | POA: Diagnosis not present

## 2021-04-14 DIAGNOSIS — M7732 Calcaneal spur, left foot: Secondary | ICD-10-CM | POA: Diagnosis not present

## 2021-04-14 DIAGNOSIS — M71572 Other bursitis, not elsewhere classified, left ankle and foot: Secondary | ICD-10-CM | POA: Diagnosis not present

## 2021-04-14 DIAGNOSIS — M7662 Achilles tendinitis, left leg: Secondary | ICD-10-CM | POA: Diagnosis not present

## 2021-04-14 DIAGNOSIS — L84 Corns and callosities: Secondary | ICD-10-CM | POA: Diagnosis not present

## 2021-04-14 DIAGNOSIS — M79672 Pain in left foot: Secondary | ICD-10-CM | POA: Diagnosis not present

## 2021-04-14 DIAGNOSIS — M7731 Calcaneal spur, right foot: Secondary | ICD-10-CM | POA: Diagnosis not present

## 2021-04-14 DIAGNOSIS — M79671 Pain in right foot: Secondary | ICD-10-CM | POA: Diagnosis not present

## 2021-04-14 DIAGNOSIS — E119 Type 2 diabetes mellitus without complications: Secondary | ICD-10-CM | POA: Diagnosis not present

## 2021-04-19 ENCOUNTER — Other Ambulatory Visit (HOSPITAL_BASED_OUTPATIENT_CLINIC_OR_DEPARTMENT_OTHER): Payer: Self-pay

## 2021-04-20 ENCOUNTER — Ambulatory Visit: Payer: Medicare Other | Attending: Family Medicine

## 2021-04-20 ENCOUNTER — Other Ambulatory Visit: Payer: Self-pay

## 2021-04-20 DIAGNOSIS — R29898 Other symptoms and signs involving the musculoskeletal system: Secondary | ICD-10-CM | POA: Insufficient documentation

## 2021-04-20 DIAGNOSIS — M6281 Muscle weakness (generalized): Secondary | ICD-10-CM | POA: Insufficient documentation

## 2021-04-20 DIAGNOSIS — R293 Abnormal posture: Secondary | ICD-10-CM | POA: Diagnosis not present

## 2021-04-20 DIAGNOSIS — M542 Cervicalgia: Secondary | ICD-10-CM | POA: Diagnosis not present

## 2021-04-20 DIAGNOSIS — M62838 Other muscle spasm: Secondary | ICD-10-CM | POA: Diagnosis not present

## 2021-04-20 NOTE — Therapy (Signed)
[facility] [address]  [place], [place], [address] Phone: [phone]   Fax:  [phone]  Physical Therapy Treatment  Patient Details  Name: Monica Wallace MRN: [id number] Date of Birth: [birth year] Referring Provider (PT): [name], MD   Encounter Date: 04/20/2021   PT End of Session - 04/20/21 1753     Visit Number 2    Number of Visits 16    Date for PT Re-Evaluation 05/25/21    Authorization Type Medicare & [employer]    PT Start Time 1703    PT Stop Time 1745    PT Time Calculation (min) 42 min    Activity Tolerance Patient tolerated treatment well    Behavior During Therapy [facility] for tasks assessed/performed             Past Medical History:  Diagnosis Date   Arthritis    Asthma    Baker's cyst of knee, right 02/16/2017   Cancer ([place])    thyroid (2002)   Cystic breast    Diabetes ([employer])    GERD (gastroesophageal reflux disease)    Hepatic cyst 02/02/2015   Hyperlipidemia    Hypertension    Insomnia 07/23/2016   Menopause    Migraine    Palpitations    Preventative health care 01/18/2016   Sleep apnea 02/02/2015   Thyroid disease     Past Surgical History:  Procedure Laterality Date   ABDOMINAL HYSTERECTOMY     took right ovary and uterus   APPENDECTOMY     CARPAL TUNNEL RELEASE Right    CESAREAN SECTION     CHOLECYSTECTOMY     EYE SURGERY Bilateral 2017   cataracts   KNEE ARTHROPLASTY     THYROIDECTOMY  2002    There were no vitals filed for this visit.   Subjective Assessment - 04/20/21 1707     Subjective Pt notes some soreness along her R UT behind her ear.    Diagnostic tests 02/22/20 -cervical MRI: 1. Moderate right C3-4 and C4-5 neural foraminal stenosis.  2. Moderate left C5-6 neural foraminal stenosis.  3. No spinal canal stenosis.    Patient Stated Goals "find ways to relax the muscles more and have less pain so I can sleep better"    Currently in Pain? Yes    Pain  Score 2     Pain Location Neck    Pain Orientation Right    Pain Descriptors / Indicators Sore    Pain Type Chronic pain                               [facility] Adult PT Treatment/Exercise - 04/20/21 0001       Self-Care   Self-Care Posture    Posture education pec stretches, improtance of keeping retracted shoulder position to reduce strain on neck muscles      Exercises   Exercises Neck      Neck Exercises: Machines for Strengthening   Nustep L2x43min      Neck Exercises: Theraband   Shoulder Extension 20 reps    Shoulder Extension Limitations yellow TB    Rows 20 reps    Rows Limitations yellow TB      Neck Exercises: Seated   Neck Retraction 10 reps;5 secs      Neck Exercises: Stretches   Chest Stretch 4 reps;20 seconds    Chest Stretch Limitations 2x low,  2x mid in doorway      Manual Therapy   Manual Therapy Soft tissue mobilization;Myofascial release    Soft tissue mobilization STM to B cervical paraspinals, UT, SCM    Myofascial Release TPR to R UT, B suboccipital release                     PT Education - 04/20/21 1753     Education Details HEP update    Person(s) Educated Patient    Methods Explanation;Demonstration;Handout    Comprehension Verbalized understanding;Returned demonstration              PT Short Term Goals - 04/20/21 1811       PT SHORT TERM GOAL #1   Title Patient will be independent with initial HEP    Status On-going    Target Date 04/27/21               PT Long Term Goals - 04/20/21 1812       PT LONG TERM GOAL #1   Title Patient will be independent with ongoing/advanced HEP for self-management at home    Status On-going      PT LONG TERM GOAL #2   Title Improve posture and alignment with patient to demonstrate improved upright posture with posterior shoulder girdle engaged    Status On-going      PT LONG TERM GOAL #3   Title Decrease R sided neck pain by >/= 50-75% allowing patient  increased ease of cervical ROM and improved comfort in preferred sleeping position    Status On-going      PT LONG TERM GOAL #4   Title Patient to improve cervical AROM to [facility] without pain provocation    Status On-going      PT LONG TERM GOAL #5   Title Patient will demonstrate improved B shoulder strength to >/= 4+/5 for functional UE use    Status On-going      PT LONG TERM GOAL #6   Title Patient will report no sleep disturbance due to R sided neck pain    Status On-going                   Plan - 04/20/21 1754     Clinical Impression Statement Pt presents today with rounded shoulder posture and forward head. Educated her on the importance of posture, pec stretches, and shoulder squeezes to decrease stress on the cervical spine. Progressed HEP with rows and extension with yellow TB. Finished session with STM of the cervical region of the spine, she demonstrated increased muscle tension globally in this area. Cues given with the exercises for proper form and technique. Instruction also given for spacing exercises throughout the day for better compliance. Pt w/o any complaints post session.    Personal Factors and Comorbidities Age;Behavior Pattern;Comorbidity 3+;Fitness;Profession;Past/Current Experience;Time since onset of injury/illness/exacerbation    Comorbidities HTN, migraine, asthma, sinusitis, pre-diabetes/DM-II, GERD, OSA, thyroid cancer s/p thyroidectomy 2002, LBP with L sciatica, DJD, OA, insomnia, R CTR    Examination-Activity Limitations Caring for Others;Lift;Carry;Sit    Examination-Participation Restrictions Church;Cleaning;Community Activity;Driving;Laundry;Occupation;School;Shop;Volunteer    Stability/Clinical Decision Making Stable/Uncomplicated    Rehab Potential Good    PT Frequency 2x / week    PT Duration 8 weeks    PT Treatment/Interventions ADLs/Self Care Home Management;Cryotherapy;Electrical Stimulation;Iontophoresis 4mg /ml Dexamethasone;Moist  Heat;Traction;Ultrasound;Functional mobility training;Therapeutic activities;Therapeutic exercise;Neuromuscular re-education;Patient/family education;Manual techniques;Passive range of motion;Dry needling;Taping;Spinal Manipulations;Joint Manipulations    PT Next Visit Plan Review initial HEP; postural awareness  and stretching; progress postural strengthening and update HEP; manual therapy & modalities PRN for pain and increased muscle tension    PT Home Exercise Plan Access Code: [email or web address] (9/13)    Consulted and Agree with Plan of Care Patient             Patient will benefit from skilled therapeutic intervention in order to improve the following deficits and impairments:  Decreased activity tolerance, Decreased endurance, Decreased knowledge of precautions, Decreased mobility, Decreased range of motion, Decreased strength, Increased fascial restricitons, Increased muscle spasms, Impaired perceived functional ability, Impaired flexibility, Impaired UE functional use, Improper body mechanics, Postural dysfunction, Pain  Visit Diagnosis: Cervicalgia  Abnormal posture  Muscle weakness (generalized)  Other symptoms and signs involving the musculoskeletal system  Other muscle spasm     Problem List Patient Active Problem List   Diagnosis Date Noted   Cervical cancer screening 03/07/2021   Sinusitis 10/30/2020   Allergic conjunctivitis 04/08/2020   Neck pain 02/06/2020   Acute bronchitis 01/13/2020   Otitis media 09/30/2019   Hip pain, right 09/30/2019   COVID-19 09/30/2019   Educated about COVID-19 virus infection 12/05/2018   High risk heterosexual behavior 11/20/2017   Baker's cyst of knee, right 02/16/2017   Insomnia 07/23/2016   Hypokalemia 07/23/2016   Controlled type 2 diabetes mellitus without complication, without long-term current use of insulin ([place]) 01/24/2016   Preventative health care 01/18/2016   Left-sided low back pain with left-sided sciatica 10/07/2015    Obesity (BMI 30-39.9) 06/05/2015   OSA (obstructive sleep apnea) 02/02/2015   Palpitations 09/19/2014   Plantar fasciitis, bilateral 11/19/2013   Pronation deformity of ankle, acquired 11/19/2013   DJD (degenerative joint disease) 08/26/2013   Fibrocystic breast 08/26/2013   HTN (hypertension) 08/26/2013   GERD (gastroesophageal reflux disease) 08/26/2013   S/P hysterectomy with oophorectomy 08/26/2013   Hypothyroidism 08/26/2013   Chronic hoarseness 08/26/2013   Colon polyp 08/26/2013   Hiatal hernia 08/26/2013   Irritable bowel syndrome 08/26/2013   Moderate persistent asthma 08/26/2013   Allergic rhinoconjunctivitis 08/26/2013   Hyperlipidemia 08/22/2013   Migraine 08/22/2013   Thyroid cancer ([place]) 08/22/2013    Artist Pais, PTA 04/20/2021, 6:15 PM  [facility] [address]  [place], [place], [address] Phone: [phone]   Fax:  [phone]  Name: Monica Wallace MRN: [id number] Date of Birth: [birth year]

## 2021-04-21 ENCOUNTER — Emergency Department (HOSPITAL_BASED_OUTPATIENT_CLINIC_OR_DEPARTMENT_OTHER): Payer: Medicare Other

## 2021-04-21 ENCOUNTER — Emergency Department (HOSPITAL_BASED_OUTPATIENT_CLINIC_OR_DEPARTMENT_OTHER)
Admission: EM | Admit: 2021-04-21 | Discharge: 2021-04-21 | Disposition: A | Discharge status: Home / Self Care | Payer: Medicare Other | Attending: Emergency Medicine | Admitting: Emergency Medicine

## 2021-04-21 ENCOUNTER — Encounter (HOSPITAL_BASED_OUTPATIENT_CLINIC_OR_DEPARTMENT_OTHER): Payer: Self-pay | Admitting: Emergency Medicine

## 2021-04-21 DIAGNOSIS — J029 Acute pharyngitis, unspecified: Secondary | ICD-10-CM | POA: Diagnosis not present

## 2021-04-21 DIAGNOSIS — J4540 Moderate persistent asthma, uncomplicated: Secondary | ICD-10-CM | POA: Insufficient documentation

## 2021-04-21 DIAGNOSIS — E039 Hypothyroidism, unspecified: Secondary | ICD-10-CM | POA: Insufficient documentation

## 2021-04-21 DIAGNOSIS — Z8616 Personal history of COVID-19: Secondary | ICD-10-CM | POA: Insufficient documentation

## 2021-04-21 DIAGNOSIS — R531 Weakness: Secondary | ICD-10-CM | POA: Diagnosis not present

## 2021-04-21 DIAGNOSIS — Z96659 Presence of unspecified artificial knee joint: Secondary | ICD-10-CM | POA: Diagnosis not present

## 2021-04-21 DIAGNOSIS — R42 Dizziness and giddiness: Secondary | ICD-10-CM | POA: Diagnosis not present

## 2021-04-21 DIAGNOSIS — R1012 Left upper quadrant pain: Secondary | ICD-10-CM | POA: Insufficient documentation

## 2021-04-21 DIAGNOSIS — E119 Type 2 diabetes mellitus without complications: Secondary | ICD-10-CM | POA: Insufficient documentation

## 2021-04-21 DIAGNOSIS — R0981 Nasal congestion: Secondary | ICD-10-CM | POA: Diagnosis not present

## 2021-04-21 DIAGNOSIS — Z85850 Personal history of malignant neoplasm of thyroid: Secondary | ICD-10-CM | POA: Insufficient documentation

## 2021-04-21 DIAGNOSIS — Z79899 Other long term (current) drug therapy: Secondary | ICD-10-CM | POA: Diagnosis not present

## 2021-04-21 DIAGNOSIS — Z20822 Contact with and (suspected) exposure to covid-19: Secondary | ICD-10-CM | POA: Insufficient documentation

## 2021-04-21 DIAGNOSIS — R509 Fever, unspecified: Secondary | ICD-10-CM | POA: Diagnosis not present

## 2021-04-21 DIAGNOSIS — I10 Essential (primary) hypertension: Secondary | ICD-10-CM | POA: Diagnosis not present

## 2021-04-21 LAB — COMPREHENSIVE METABOLIC PANEL
ALT: 25 U/L (ref 0–44)
AST: 28 U/L (ref 15–41)
Albumin: 3.9 g/dL (ref 3.5–5.0)
Alkaline Phosphatase: 76 U/L (ref 38–126)
Anion gap: 9 (ref 5–15)
BUN: 18 mg/dL (ref 8–23)
CO2: 31 mmol/L (ref 22–32)
Calcium: 9.7 mg/dL (ref 8.9–10.3)
Chloride: 98 mmol/L (ref 98–111)
Creatinine, Ser: 0.88 mg/dL (ref 0.44–1.00)
GFR, Estimated: >60 mL/min (ref >60)
Glucose, Bld: 108 mg/dL — ABNORMAL HIGH (ref 70–99)
Potassium: 3.5 mmol/L (ref 3.5–5.1)
Sodium: 138 mmol/L (ref 135–145)
Total Bilirubin: 0.3 mg/dL (ref 0.3–1.2)
Total Protein: 7.7 g/dL (ref 6.5–8.1)

## 2021-04-21 LAB — DIFFERENTIAL
Abs Immature Granulocytes: 0.06 10*3/uL (ref 0.00–0.07)
Basophils Absolute: 0 10*3/uL (ref 0.0–0.1)
Basophils Relative: 0 %
Eosinophils Absolute: 0.2 10*3/uL (ref 0.0–0.5)
Eosinophils Relative: 2 %
Immature Granulocytes: 1 %
Lymphocytes Relative: 31 %
Lymphs Abs: 3 10*3/uL (ref 0.7–4.0)
Monocytes Absolute: 0.7 10*3/uL (ref 0.1–1.0)
Monocytes Relative: 7 %
Neutro Abs: 5.7 10*3/uL (ref 1.7–7.7)
Neutrophils Relative %: 59 %

## 2021-04-21 LAB — APTT: aPTT: 30 seconds (ref 24–36)

## 2021-04-21 LAB — RAPID URINE DRUG SCREEN, HOSP PERFORMED
Amphetamines: NOT DETECTED
Barbiturates: NOT DETECTED
Benzodiazepines: NOT DETECTED
Cocaine: NOT DETECTED
Opiates: NOT DETECTED
Tetrahydrocannabinol: NOT DETECTED

## 2021-04-21 LAB — URINALYSIS, ROUTINE W REFLEX MICROSCOPIC
Bilirubin Urine: NEGATIVE
Glucose, UA: NEGATIVE mg/dL
Ketones, ur: NEGATIVE mg/dL
Leukocytes,Ua: NEGATIVE
Nitrite: NEGATIVE
Protein, ur: NEGATIVE mg/dL
Specific Gravity, Urine: 1.015 (ref 1.005–1.030)
pH: 7.5 (ref 5.0–8.0)

## 2021-04-21 LAB — URINALYSIS, MICROSCOPIC (REFLEX)

## 2021-04-21 LAB — PROTIME-INR
INR: 0.9 (ref 0.8–1.2)
Prothrombin Time: 12.5 seconds (ref 11.4–15.2)

## 2021-04-21 LAB — CBC
HCT: 40.4 % (ref 36.0–46.0)
Hemoglobin: 13.5 g/dL (ref 12.0–15.0)
MCH: 29.9 pg (ref 26.0–34.0)
MCHC: 33.4 g/dL (ref 30.0–36.0)
MCV: 89.4 fL (ref 80.0–100.0)
Platelets: 312 10*3/uL (ref 150–400)
RBC: 4.52 MIL/uL (ref 3.87–5.11)
RDW: 13.2 % (ref 11.5–15.5)
WBC: 9.8 10*3/uL (ref 4.0–10.5)
nRBC: 0 % (ref 0.0–0.2)

## 2021-04-21 LAB — CBG MONITORING, ED: Glucose-Capillary: 115 mg/dL — ABNORMAL HIGH (ref 70–99)

## 2021-04-21 LAB — RESP PANEL BY RT-PCR (FLU A&B, COVID) ARPGX2
Influenza A by PCR: NEGATIVE
Influenza B by PCR: NEGATIVE
SARS Coronavirus 2 by RT PCR: NEGATIVE

## 2021-04-21 LAB — ETHANOL: Alcohol, Ethyl (B): <10 mg/dL (ref <10)

## 2021-04-21 MED ORDER — SODIUM CHLORIDE 0.9 % IV BOLUS
500.0000 mL | Freq: Once | INTRAVENOUS | Status: AC
  Administered 2021-04-21: 500 mL via INTRAVENOUS

## 2021-04-21 NOTE — ED Notes (Signed)
Pt gone to CT 

## 2021-04-21 NOTE — ED Notes (Signed)
PT ambulated with a mostly steady gait and minor assistance

## 2021-04-21 NOTE — ED Provider Notes (Signed)
Emergency Department Provider Note   I have reviewed the triage vital signs and the nursing notes.   HISTORY  Chief Complaint Weakness   HPI Monica Wallace is a 70 y.o. female with PMH reviewed below presents to the emergency department for evaluation of an episode of generalized weakness which occurred this morning.  She awoke early this morning and was able to get out of bed and began to do her morning tasks but suddenly felt generally weak and unsteady on her feet.  She states that both legs felt "like rubber." She denies any vertigo.  No unilateral symptoms.  No vision or speech changes.  No new medications.  No fevers or chills.  She states it does remind her somewhat of the time when she had COVID in 2020.  She has had mild sore throat along with some nasal congestion in addition to the episode of weakness this morning.  Denies any numbness.  Denies headaches or chest pain.  She has had issues with muscle cramping type pain in the left upper quadrant but states this is been going on for "a while" and is worse with twisting or moving. Denies any suddenly worsening pain.    Past Medical History:  Diagnosis Date   Arthritis    Asthma    Baker's cyst of knee, right 02/16/2017   Cancer ([place])    thyroid (2002)   Cystic breast    Diabetes ([place])    GERD (gastroesophageal reflux disease)    Hepatic cyst 02/02/2015   Hyperlipidemia    Hypertension    Insomnia 07/23/2016   Menopause    Migraine    Palpitations    Preventative health care 01/18/2016   Sleep apnea 02/02/2015   Thyroid disease     Patient Active Problem List   Diagnosis Date Noted   Cervical cancer screening 03/07/2021   Sinusitis 10/30/2020   Allergic conjunctivitis 04/08/2020   Neck pain 02/06/2020   Acute bronchitis 01/13/2020   Otitis media 09/30/2019   Hip pain, right 09/30/2019   COVID-19 09/30/2019   Educated about COVID-19 virus infection 12/05/2018   High risk heterosexual behavior 11/20/2017   Baker's  cyst of knee, right 02/16/2017   Insomnia 07/23/2016   Hypokalemia 07/23/2016   Controlled type 2 diabetes mellitus without complication, without Amore Grater-term current use of insulin ([place]) 01/24/2016   Preventative health care 01/18/2016   Left-sided low back pain with left-sided sciatica 10/07/2015   Obesity (BMI 30-39.9) 06/05/2015   OSA (obstructive sleep apnea) 02/02/2015   Palpitations 09/19/2014   Plantar fasciitis, bilateral 11/19/2013   Pronation deformity of ankle, acquired 11/19/2013   DJD (degenerative joint disease) 08/26/2013   Fibrocystic breast 08/26/2013   HTN (hypertension) 08/26/2013   GERD (gastroesophageal reflux disease) 08/26/2013   S/P hysterectomy with oophorectomy 08/26/2013   Hypothyroidism 08/26/2013   Chronic hoarseness 08/26/2013   Colon polyp 08/26/2013   Hiatal hernia 08/26/2013   Irritable bowel syndrome 08/26/2013   Moderate persistent asthma 08/26/2013   Allergic rhinoconjunctivitis 08/26/2013   Hyperlipidemia 08/22/2013   Migraine 08/22/2013   Thyroid cancer ([place]) 08/22/2013    Past Surgical History:  Procedure Laterality Date   ABDOMINAL HYSTERECTOMY     took right ovary and uterus   APPENDECTOMY     CARPAL TUNNEL RELEASE Right    CESAREAN SECTION     CHOLECYSTECTOMY     EYE SURGERY Bilateral 2017   cataracts   KNEE ARTHROPLASTY     SHOULDER ARTHROSCOPY W/ ROTATOR CUFF REPAIR Left  THYROIDECTOMY  2002    Allergies Levaquin [levofloxacin], Clindamycin/lincomycin, Gabapentin, Penicillins, and Codeine  Family History  Problem Relation Age of Onset   Arthritis Mother    Transient ischemic attack Mother    Hypertension Mother    Hyperlipidemia Mother    Diabetes Father    Heart disease Father    Arthritis Father    Kidney disease Father    Hypertension Father    Hyperlipidemia Father    Cancer Sister 79       pancreatic   Fibromyalgia Sister    Allergic rhinitis Sister    Asthma Maternal Grandfather        smoker    Depression Maternal Grandfather    Dementia Paternal Grandmother    Diabetes Paternal Grandfather    Hypertension Brother    Allergic rhinitis Brother    Scoliosis Daughter        had rods placed and removed   Arthritis Sister    Gout Sister    GER disease Sister    Cancer Sister        breast cancer   Breast cancer Sister 31    Social History Social History   Tobacco Use   Smoking status: Never   Smokeless tobacco: Never  Vaping Use   Vaping Use: Never used  Substance Use Topics   Alcohol use: Yes    Alcohol/week: 0.0 standard drinks    Comment: very rare once or twice a year   Drug use: No    Review of Systems  Constitutional: No fever/chills. Episode of generalized weakness.  Eyes: No visual changes. ENT: Mild sore throat and congestion.  Cardiovascular: Denies chest pain. Respiratory: Denies shortness of breath. Gastrointestinal: No abdominal pain.  No nausea, no vomiting.  No diarrhea.  No constipation. Genitourinary: Negative for dysuria. Musculoskeletal: Negative for back pain. Skin: Negative for rash. Neurological: Negative for headaches, focal weakness or numbness.  10-point ROS otherwise negative.  ____________________________________________   PHYSICAL EXAM:  VITAL SIGNS: ED Triage Vitals  Enc Vitals Group     BP 04/21/21 0632 (!) 170/88     Pulse Rate 04/21/21 0632 69     Resp 04/21/21 0632 16     Temp 04/21/21 0632 97.6 F (36.4 C)     Temp Source 04/21/21 0632 Oral     SpO2 04/21/21 0632 100 %     Weight 04/21/21 0627 200 lb (90.7 kg)     Height 04/21/21 0627 5\' 2"  (1.575 m)   Constitutional: Alert and oriented. Well appearing and in no acute distress. Eyes: Conjunctivae are normal. PERRL. EOMI. Head: Atraumatic. Nose: No congestion/rhinnorhea. Mouth/Throat: Mucous membranes are moist.  Oropharynx w/ mild erythema.  Neck: No stridor.   Cardiovascular: Normal rate, regular rhythm. Good peripheral circulation. Grossly normal heart  sounds.   Respiratory: Normal respiratory effort.  No retractions. Lungs CTAB. Gastrointestinal: Soft and nontender. No distention.  Musculoskeletal: No lower extremity tenderness nor edema. No gross deformities of extremities. Neurologic:  Normal speech and language. No gross focal neurologic deficits are appreciated. No facial asymmetry. 5/5 strength in the bilateral upper and lower extremities.  Skin:  Skin is warm, dry and intact. No rash noted.  ____________________________________________   LABS (all labs ordered are listed, but only abnormal results are displayed)  Labs Reviewed  COMPREHENSIVE METABOLIC PANEL - Abnormal; Notable for the following components:      Result Value   Glucose, Bld 108 (*)    All other components within normal limits  URINALYSIS, ROUTINE  W REFLEX MICROSCOPIC - Abnormal; Notable for the following components:   Hgb urine dipstick TRACE (*)    All other components within normal limits  URINALYSIS, MICROSCOPIC (REFLEX) - Abnormal; Notable for the following components:   Bacteria, UA RARE (*)    All other components within normal limits  CBG MONITORING, ED - Abnormal; Notable for the following components:   Glucose-Capillary 115 (*)    All other components within normal limits  RESP PANEL BY RT-PCR (FLU A&B, COVID) ARPGX2  ETHANOL  PROTIME-INR  APTT  CBC  DIFFERENTIAL  RAPID URINE DRUG SCREEN, HOSP PERFORMED   ____________________________________________  EKG   EKG Interpretation  Date/Time:  Wednesday April 21 2021 06:42:34 EDT Ventricular Rate:  69 PR Interval:  173 QRS Duration: 98 QT Interval:  427 QTC Calculation: 458 R Axis:   -3 Text Interpretation: Sinus rhythm Confirmed by Haselton Fraise [phone]) on 04/21/2021 6:44:23 AM        ____________________________________________  RADIOLOGY  CT HEAD WO CONTRAST  Result Date: 04/21/2021 CLINICAL DATA:  70 year old female with history of dizziness. Bilateral leg weakness. EXAM:  CT HEAD WITHOUT CONTRAST TECHNIQUE: Contiguous axial images were obtained from the base of the skull through the vertex without intravenous contrast. COMPARISON:  No priors. FINDINGS: Brain: No evidence of acute infarction, hemorrhage, hydrocephalus, extra-axial collection or mass lesion/mass effect. Vascular: No hyperdense vessel or unexpected calcification. Skull: Normal. Negative for fracture or focal lesion. Sinuses/Orbits: No acute finding. Other: None. IMPRESSION: 1. No acute intracranial abnormalities. The appearance of the brain is normal. Electronically Signed   By: Vinnie Langton M.D.   On: 04/21/2021 07:02    ____________________________________________   PROCEDURES  Procedure(s) performed:   Procedures  None  ____________________________________________   INITIAL IMPRESSION / ASSESSMENT AND PLAN / ED COURSE  Pertinent labs & imaging results that were available during my care of the patient were reviewed by me and considered in my medical decision making (see chart for details).   Patient presents to the emergency department valuation of generalized weakness which occurred this morning.  The patient describes this as an episode and symptoms have improved.  I do not appreciate any focal neurologic deficits to strongly suspect stroke or other central process.  She is not having neck or back pain to suspect acute cord compression/emergency.  She is having some mild URI symptoms and have planned for COVID testing as this does remind her somewhat of when she has had this in the past.  She is not having chest pain or shortness of breath.  No hypoxemia.  Vitals are significant for mild hypertension.  CT imaging of the head and lab work initiated by the [facility] process and largely unremarkable my initial review.  08:10 AM  Patient has been up and ambulatory to the bathroom here without assistance.  No gait instability noted.  COVID and flu testing are negative.  UA was negative for infection.   Patient, overall, states she is feeling much better since arriving to the emergency department.  She is finishing up her IV fluids.  My suspicion for TIA/CVA is low.  There was no vertigo and no unilateral symptoms.  The episode was brief and described more as feeling generally weak and somewhat lightheaded but no syncope.  EKG interpreted as above.  CT head without acute findings.  Feel that transfer for emergency MRI in this setting would be low yield but discussed with the patient, and daughter, should symptoms continue that should call 911/return to the emergency  department for reevaluation.  She plans to call her PCP for follow-up appointment this week if possible.  We will provide a work note.  ____________________________________________  FINAL CLINICAL IMPRESSION(S) / ED DIAGNOSES  Final diagnoses:  Generalized weakness    MEDICATIONS GIVEN DURING THIS VISIT:  Medications  sodium chloride 0.9 % bolus 500 mL (500 mLs Intravenous New Bag/Given 04/21/21 0726)    Note:  This document was prepared using Dragon voice recognition software and may include unintentional dictation errors.  Nanda Quinton, MD, [facility] Emergency Medicine    Denis Koppel, [facility], MD 04/21/21 [phone]

## 2021-04-21 NOTE — ED Notes (Signed)
Pt back from CT

## 2021-04-21 NOTE — Discharge Instructions (Signed)
You were seen in the emergency room today with episode of weakness and lightheadedness.  Your lab work and CT scans here are reassuring.  Please continue to drink plenty of fluids and get adequate rest.  I am taking out of work for the next several days to help with this.  Please call your primary care doctor to schedule follow-up appointment in the coming week.  Should your symptoms return/worsen you should call 911 and/or return to the emergency department immediately for reevaluation.

## 2021-04-21 NOTE — ED Provider Notes (Signed)
Emergency Medicine Provider Triage Evaluation Note  Monica Wallace , a 70 y.o. female  was evaluated in triage.  Pt complains of dizziness and weakness.  Review of Systems  Positive: weakness Negative: syncope  Physical Exam  BP (!) 170/88 (BP Location: Right Arm)   Pulse 69   Temp 97.6 F (36.4 C) (Oral)   Resp 16   Ht 1.575 m (5\' 2" )   Wt 90.7 kg   SpO2 100%   BMI 36.58 kg/m  Gen:   Awake, no distress   Resp:  Normal effort  MSK:   Moves extremities without difficulty  Other:  No arm/leg drift noted  Medical Decision Making  Medically screening exam initiated at 6:36 AM.  Appropriate orders placed.  Monica Wallace was informed that the remainder of the evaluation will be completed by another provider, this initial triage assessment does not replace that evaluation, and the importance of remaining in the ED until their evaluation is complete.     Cisse Fraise, MD 04/21/21 [phone]

## 2021-04-21 NOTE — ED Triage Notes (Signed)
Pt woke up this morning to get ready for work went to stand and had bi lat leg weakness. About 5am. Felt some dizziness with abnormal gait. Her for eval.

## 2021-04-22 ENCOUNTER — Encounter: Payer: Medicare Other | Admitting: Physical Therapy

## 2021-04-22 ENCOUNTER — Other Ambulatory Visit: Payer: Self-pay

## 2021-04-22 ENCOUNTER — Telehealth: Payer: Self-pay | Admitting: *Deleted

## 2021-04-22 DIAGNOSIS — I4891 Unspecified atrial fibrillation: Secondary | ICD-10-CM

## 2021-04-22 NOTE — Chronic Care Management (AMB) (Signed)
  Chronic Care Management   Outreach Note  04/22/2021 Name: Monica Wallace MRN: [id number] DOB: Aug 17, 1950  Monica Wallace is a 70 y.o. year old female who is a primary care patient of [name], MD. I reached out to Monica Wallace by phone today in response to a referral sent by Monica Wallace's primary care provider.  An unsuccessful telephone outreach was attempted today. The patient was referred to the case management team for assistance with care management and care coordination.   Follow Up Plan: A HIPAA compliant phone message was left for the patient providing contact information and requesting a return call.  If patient returns call to provider office, please advise to call Embedded Care Management Care Guide Monica Wallace at [place], [place] [facility]  Direct Dial: [phone]

## 2021-04-22 NOTE — Chronic Care Management (AMB) (Signed)
  Chronic Care Management   Note  04/22/2021 Name: Monica Wallace MRN: [id number] DOB: 03-28-1951  Monica Wallace is a 70 y.o. year old female who is a primary care patient of [name], MD. I reached out to Myrtice Lauth by phone today in response to a referral sent by Monica Wallace's PCP.  Monica Wallace was given information about Chronic Care Management services today including:  CCM service includes personalized support from designated clinical staff supervised by her physician, including individualized plan of care and coordination with other care providers 24/7 contact phone numbers for assistance for urgent and routine care needs. Service will only be billed when office clinical staff spend 20 minutes or more in a month to coordinate care. Only one practitioner may furnish and bill the service in a calendar month. The patient may stop CCM services at any time (effective at the end of the month) by phone call to the office staff. The patient is responsible for co-pay (up to 20% after annual deductible is met) if co-pay is required by the individual health plan.   Patient agreed to services and verbal consent obtained.   Follow up plan: Telephone appointment with care management team member scheduled for: 05/10/2021  Julian Hy, [place] [facility]  Direct Dial: [phone]

## 2021-04-26 ENCOUNTER — Ambulatory Visit (INDEPENDENT_AMBULATORY_CARE_PROVIDER_SITE_OTHER): Payer: Medicare Other | Admitting: Medical

## 2021-04-26 ENCOUNTER — Other Ambulatory Visit: Payer: Self-pay

## 2021-04-26 VITALS — BP 129/61 | HR 79 | Temp 98.3°F | Resp 18 | Ht 62.0 in | Wt 204.0 lb

## 2021-04-26 DIAGNOSIS — J209 Acute bronchitis, unspecified: Secondary | ICD-10-CM

## 2021-04-26 DIAGNOSIS — R059 Cough, unspecified: Secondary | ICD-10-CM | POA: Diagnosis not present

## 2021-04-26 DIAGNOSIS — R0981 Nasal congestion: Secondary | ICD-10-CM

## 2021-04-26 MED ORDER — AZITHROMYCIN 250 MG PO TABS: ORAL_TABLET | ORAL | 0 refills | Status: AC

## 2021-04-26 MED ORDER — BENZONATATE 100 MG PO CAPS: 100.0000 mg | ORAL_CAPSULE | Freq: Three times a day (TID) | ORAL | 0 refills | Status: DC | PRN

## 2021-04-26 NOTE — Progress Notes (Signed)
 Subjective:    Patient ID: Monica Wallace, female    DOB: 02/17/1951, 70 y.o.   MRN: [id number]  HPI  Pt in for follow up.  Seen on 04-21-21.  "Chief Complaint Weakness     HPI Monica Wallace is a 70 y.o. female with PMH reviewed below presents to the emergency department for evaluation of an episode of generalized weakness which occurred this morning.  She awoke early this morning and was able to get out of bed and began to do her morning tasks but suddenly felt generally weak and unsteady on her feet.  She states that both legs felt "like rubber." She denies any vertigo.  No unilateral symptoms.  No vision or speech changes.  No new medications.  No fevers or chills.  She states it does remind her somewhat of the time when she had COVID in 2020.  She has had mild sore throat along with some nasal congestion in addition to the episode of weakness this morning.  Denies any numbness.  Denies headaches or chest pain.  She has had issues with muscle cramping type pain in the left upper quadrant but states this is been going on for "a while" and is worse with twisting or moving. Denies any suddenly worsening pain. "  Bp 170/88  Labs Reviewed  COMPREHENSIVE METABOLIC PANEL - Abnormal; Notable for the following components:      Result Value     Glucose, Bld 108 (*)      All other components within normal limits  URINALYSIS, ROUTINE W REFLEX MICROSCOPIC - Abnormal; Notable for the following components:    Hgb urine dipstick TRACE (*)      All other components within normal limits  URINALYSIS, MICROSCOPIC (REFLEX) - Abnormal; Notable for the following components:    Bacteria, UA RARE (*)      All other components within normal limits  CBG MONITORING, ED - Abnormal; Notable for the following components:    Glucose-Capillary 115 (*)      All other components within normal limits    Ekg done sinus rhythm.  Ct head showed   IMPRESSION: 1. No acute intracranial abnormalities. The appearance of  the brain is normal. Electronically Signed      "NITIAL IMPRESSION / ASSESSMENT AND PLAN / ED COURSE   Pertinent labs & imaging results that were available during my care of the patient were reviewed by me and considered in my medical decision making (see chart for details).   Patient presents to the emergency department valuation of generalized weakness which occurred this morning.  The patient describes this as an episode and symptoms have improved.  I do not appreciate any focal neurologic deficits to strongly suspect stroke or other central process.  She is not having neck or back pain to suspect acute cord compression/emergency.  She is having some mild URI symptoms and have planned for COVID testing as this does remind her somewhat of when she has had this in the past.  She is not having chest pain or shortness of breath.  No hypoxemia.  Vitals are significant for mild hypertension.  CT imaging of the head and lab work initiated by the [facility] process and largely unremarkable my initial review.   08:10 AM  Patient has been up and ambulatory to the bathroom here without assistance.  No gait instability noted.  COVID and flu testing are negative.  UA was negative for infection.  Patient, overall, states she is feeling much better since arriving   to the emergency department.  She is finishing up her IV fluids.  My suspicion for TIA/CVA is low.  There was no vertigo and no unilateral symptoms.  The episode was brief and described more as feeling generally weak and somewhat lightheaded but no syncope.  EKG interpreted as above.  CT head without acute findings.  Feel that transfer for emergency MRI in this setting would be low yield but discussed with the patient, and daughter, should symptoms continue that should call 911/return to the emergency department for reevaluation.  She plans to call her PCP for follow-up appointment this week if possible.  We will provide a work note. "    Pt states presently  just little nasal congestion and runny nose. She has some sinus pressure. This summer she had out pt procedure with ent. Since then she had no sinus issues.  Pt states presently feels like sinus infection or head cold. Maybe allergies as she suffers from year round allregies.  Pt is on flonase, astlein, montelukast and clairitin.  Recent productive cough.    Review of Systems  Constitutional:  Negative for chills, fatigue and fever.  HENT:  Positive for congestion.   Respiratory:  Negative for cough, chest tightness and wheezing.   Cardiovascular:  Negative for chest pain and palpitations.  Gastrointestinal:  Negative for abdominal pain, constipation, nausea and vomiting.  Genitourinary:  Negative for difficulty urinating, dysuria, flank pain, frequency and genital sores.  Musculoskeletal:  Negative for back pain.    Past Medical History:  Diagnosis Date   Arthritis    Asthma    Baker's cyst of knee, right 02/16/2017   Cancer (HCC)    thyroid (2002)   Cystic breast    Diabetes (HCC)    GERD (gastroesophageal reflux disease)    Hepatic cyst 02/02/2015   Hyperlipidemia    Hypertension    Insomnia 07/23/2016   Menopause    Migraine    Palpitations    Preventative health care 01/18/2016   Sleep apnea 02/02/2015   Thyroid disease      Social History   Socioeconomic History   Marital status: Divorced    Spouse name: Not on file   Number of children: Not on file   Years of education: Not on file   Highest education level: Not on file  Occupational History   Not on file  Tobacco Use   Smoking status: Never   Smokeless tobacco: Never  Vaping Use   Vaping Use: Never used  Substance and Sexual Activity   Alcohol use: Yes    Alcohol/week: 0.0 standard drinks    Comment: very rare once or twice a year   Drug use: No   Sexual activity: Yes    Birth control/protection: Other-see comments, Surgical    Comment:  boyfriend  Other Topics Concern   Not on file  Social History  Narrative   Not on file   Social Determinants of Health   Financial Resource Strain: Low Risk    Difficulty of Paying Living Expenses: Not hard at all  Food Insecurity: No Food Insecurity   Worried About Running Out of Food in the Last Year: Never true   Ran Out of Food in the Last Year: Never true  Transportation Needs: No Transportation Needs   Lack of Transportation (Medical): No   Lack of Transportation (Non-Medical): No  Physical Activity: Inactive   Days of Exercise per Week: 0 days   Minutes of Exercise per Session: 0 min  Stress:   No Stress Concern Present   Feeling of Stress : Not at all  Social Connections: Moderately Isolated   Frequency of Communication with Friends and Family: More than three times a week   Frequency of Social Gatherings with Friends and Family: More than three times a week   Attends Religious Services: More than 4 times per year   Active Member of [employer] or Organizations: No   Attends Music therapist: Never   Marital Status: Divorced  Human resources officer Violence: Not At Risk   Fear of Current or Ex-Partner: No   Emotionally Abused: No   Physically Abused: No   Sexually Abused: No    Past Surgical History:  Procedure Laterality Date   ABDOMINAL HYSTERECTOMY     took right ovary and uterus   APPENDECTOMY     CARPAL TUNNEL RELEASE Right    [place] Bilateral 2017   cataracts   KNEE ARTHROPLASTY     SHOULDER ARTHROSCOPY W/ ROTATOR CUFF REPAIR Left    THYROIDECTOMY  2002    Family History  Problem Relation Age of Onset   Arthritis Mother    Transient ischemic attack Mother    Hypertension Mother    Hyperlipidemia Mother    Diabetes Father    Heart disease Father    Arthritis Father    Kidney disease Father    Hypertension Father    Hyperlipidemia Father    Cancer Sister 75       pancreatic   Fibromyalgia Sister    Allergic rhinitis Sister    Asthma Maternal Grandfather         smoker   Depression Maternal Grandfather    Dementia Paternal Grandmother    Diabetes Paternal Grandfather    Hypertension Brother    Allergic rhinitis Brother    Scoliosis Daughter        had rods placed and removed   Arthritis Sister    Gout Sister    GER disease Sister    Cancer Sister        breast cancer   Breast cancer Sister 45    Allergies  Allergen Reactions   Levaquin [Levofloxacin] Shortness Of Breath   Clindamycin/Lincomycin     Pruritus No rash   Gabapentin Itching   Penicillins     Positive on allergy testing 'years ago.' Has never had penicillin. Has tolerated amoxicillin.   Codeine Itching    Current Outpatient Medications on File Prior to Visit  Medication Sig Dispense Refill   acetaminophen (TYLENOL) 325 MG tablet Take 650 mg by mouth every 6 (six) hours as needed.     albuterol (VENTOLIN HFA) 108 (90 Base) MCG/ACT inhaler INHALE 2 PUFFS INTO THE LUNGS EVERY 6 HOURS AS NEEDED FOR WHEEZING OR SHORTNESS OF BREATH (Patient taking differently: Inhale 2 puffs into the lungs every 6 (six) hours as needed for wheezing or shortness of breath.) 25.5 g 1   Ascorbic Acid (VITAMIN C) 100 MG tablet Take 100 mg by mouth as needed.      azelastine (ASTELIN) 0.1 % nasal spray Place 1 spray in each nostril twice a day as needed for runny nose/drainage down throat 90 mL 1   benzonatate (TESSALON PERLES) 100 MG capsule Take 1-2 capsules (100-200 mg total) by mouth 3 (three) times daily as needed for cough. 60 capsule 1   Blood Glucose Monitoring Suppl ([place]) w/Device KIT Use to check blood glucose once  a day.  DX code: E11.9 1 kit 0   budesonide (PULMICORT) 180 MCG/ACT inhaler Inhale 1-2 puffs into the lungs 2 (two) times daily. 3 each 1   Cholecalciferol ([employer] VIT D 5000 HIGH-POTENCY PO) Take 5,000 Units by mouth.     fluconazole (DIFLUCAN) 150 MG tablet Take 1 tablet (150 mg total) by mouth once a week. 2 tablet 1   fluticasone (FLONASE) 50 MCG/ACT nasal  spray Place 2 sprays into both nostrils daily. 16 g 6   glucose blood (ONETOUCH VERIO) test strip Use to check blood glucose once a day.  DX code: E11.9 100 each 1   hyoscyamine (LEVSIN SL) 0.125 MG SL tablet Place 1 tablet (0.125 mg total) under the tongue every 4 (four) hours as needed. 30 tablet 1   Lancets (ONETOUCH DELICA PLUS TOIZTI45Y) MISC Use to check blood glucose once a day.  DX code: E11.9 100 each 1   loratadine (CLARITIN) 10 MG tablet Take 10 mg by mouth in the morning and at bedtime.      montelukast (SINGULAIR) 10 MG tablet TAKE 1 TABLET(10 MG) BY MOUTH DAILY AS NEEDED 90 tablet 1   Omega-3 1000 MG CAPS Take 1,000 mg by mouth daily.     omeprazole (PRILOSEC) 40 MG capsule Take 1 capsule by mouth daily.     rosuvastatin (CRESTOR) 5 MG tablet TAKE 1 TABLET BY MOUTH 2 TIMES A WEEK(TUESDAYS AND SUNDAYS WEEKLY) 25 tablet 3   SYNTHROID 150 MCG tablet TAKE 1 TABLET(150 MCG) BY MOUTH DAILY BEFORE AND BREAKFAST 90 tablet 1   triamterene-hydrochlorothiazide (MAXZIDE) 75-50 MG tablet TAKE 1 TABLET BY MOUTH DAILY 90 tablet 1   No current facility-administered medications on file prior to visit.    BP 129/61   Pulse 79   Temp 98.3 F (36.8 C)   Resp 18   Ht 5' 2" (1.575 m)   Wt 204 lb (92.5 kg)   SpO2 92%   BMI 37.31 kg/m       Objective:   Physical Exam    General- No acute distress. Pleasant patient. Neck- Full range of motion, no jvd Lungs- Clear, even and unlabored. Heart- regular rate and rhythm. Neurologic- CNII- XII grossly intact.   Heent- no sinus pressure, a lot pnd on exam. Both ears clear from wax moderate dull tm.    Assessment & Plan:   Patient Instructions  On review of extensive work-up done in the emergency department 5 days ago work-up was negative.   Discussed possible postural hypotension the day you went to the emergency department.  This event like this occurs again recommend sitting down and checking blood pressure and pulse.  Also checking  sugar level. Notify us if weak rubbery leg sensation returns.   Presently presenting with probable flare of your chronic allergies.  Some concern for early otitis media by physical exam.  Also concern for bronchitis since she had productive cough.  Prescribing azithromycin antibiotic.  Continue Flonase, Astelin, Singulair and Claritin.  Hopefully you will feel better by 1 week and at that point can get COVID-vaccine and flu vaccine.  Follow-up in 10 to 14 days or sooner if needed.   Mackie Pai, PA-C

## 2021-04-26 NOTE — Patient Instructions (Signed)
On review of extensive work-up done in the emergency department 5 days ago work-up was negative.   Discussed possible postural hypotension the day you went to the emergency department.  This event like this occurs again recommend sitting down and checking blood pressure and pulse.  Also checking sugar level. Notify us if weak rubbery leg sensation returns.   Presently presenting with probable flare of your chronic allergies.  Some concern for early otitis media by physical exam.  Also concern for bronchitis since she had productive cough.  Prescribing azithromycin antibiotic.  Continue Flonase, Astelin, Singulair and Claritin.  Hopefully you will feel better by 1 week and at that point can get COVID-vaccine and flu vaccine.  Follow-up in 10 to 14 days or sooner if needed.

## 2021-04-27 ENCOUNTER — Ambulatory Visit: Payer: Medicare Other

## 2021-04-27 DIAGNOSIS — R293 Abnormal posture: Secondary | ICD-10-CM

## 2021-04-27 DIAGNOSIS — M6281 Muscle weakness (generalized): Secondary | ICD-10-CM

## 2021-04-27 DIAGNOSIS — M542 Cervicalgia: Secondary | ICD-10-CM | POA: Diagnosis not present

## 2021-04-27 DIAGNOSIS — R29898 Other symptoms and signs involving the musculoskeletal system: Secondary | ICD-10-CM

## 2021-04-27 DIAGNOSIS — M62838 Other muscle spasm: Secondary | ICD-10-CM | POA: Diagnosis not present

## 2021-04-27 NOTE — Therapy (Signed)
[facility] [address]  [place], [place], [address] Phone: [phone]   Fax:  [phone]  Physical Therapy Treatment  Patient Details  Name: Monica Wallace MRN: [id number] Date of Birth: 1950/08/24 Referring Provider (PT): [name], MD   Encounter Date: 04/27/2021   PT End of Session - 04/27/21 1806     Visit Number 3    Number of Visits 16    Date for PT Re-Evaluation 05/25/21    Authorization Type Medicare & [employer]    PT Start Time 1702    PT Stop Time 1746    PT Time Calculation (min) 44 min    Activity Tolerance Patient tolerated treatment well    Behavior During Therapy [facility] for tasks assessed/performed             Past Medical History:  Diagnosis Date   Arthritis    Asthma    Baker's cyst of knee, right 02/16/2017   Cancer ([place])    thyroid (2002)   Cystic breast    Diabetes ([place])    GERD (gastroesophageal reflux disease)    Hepatic cyst 02/02/2015   Hyperlipidemia    Hypertension    Insomnia 07/23/2016   Menopause    Migraine    Palpitations    Preventative health care 01/18/2016   Sleep apnea 02/02/2015   Thyroid disease     Past Surgical History:  Procedure Laterality Date   ABDOMINAL HYSTERECTOMY     took right ovary and uterus   APPENDECTOMY     CARPAL TUNNEL RELEASE Right    [place] SECTION     CHOLECYSTECTOMY     EYE SURGERY Bilateral 2017   cataracts   KNEE ARTHROPLASTY     SHOULDER ARTHROSCOPY W/ ROTATOR CUFF REPAIR Left    THYROIDECTOMY  2002    There were no vitals filed for this visit.   Subjective Assessment - 04/27/21 1711     Subjective Pt reports that last Wednesday she had an episode where she stood up and noted that her legs felt heavy and reported feeling lightheaded with pressure on the base of her skull. Went to cardiology got a bunch of test and they ruled out anything cardiac related but did find that she had got a sinus infection.    Diagnostic  tests 02/22/20 -cervical MRI: 1. Moderate right C3-4 and C4-5 neural foraminal stenosis.  2. Moderate left C5-6 neural foraminal stenosis.  3. No spinal canal stenosis.    Patient Stated Goals "find ways to relax the muscles more and have less pain so I can sleep better"    Currently in Pain? Yes    Pain Score 6     Pain Location --   behind ears   Pain Orientation Right;Left    Pain Descriptors / Indicators Aching    Pain Type Acute pain                               [facility] Adult PT Treatment/Exercise - 04/27/21 0001       Neck Exercises: Machines for Strengthening   UBE (Upper Arm Bike) L1.0 3 min each way      Neck Exercises: Theraband   Shoulder External Rotation 10 reps    Shoulder External Rotation Limitations yellow TB; cues for retraction    Horizontal ABduction 10 reps    Horizontal ABduction Limitations yellow TB; cues for retraction  Other Theraband Exercises alt UE chops with yellow TB 10 reps each      Neck Exercises: Supine   Neck Retraction 10 reps;3 secs      Neck Exercises: Stretches   Upper Trapezius Stretch Right;30 seconds    Levator Stretch Right;30 seconds      Modalities   Modalities Moist Heat      Moist Heat Therapy   Number Minutes Moist Heat 10 Minutes    Moist Heat Location Cervical      Manual Therapy   Manual Therapy Soft tissue mobilization;Myofascial release;Manual Traction    Soft tissue mobilization STM to B cervical paraspinals, UT    Myofascial Release B suboccipital release    Manual Traction cervical traction with prolonged holds                     PT Education - 04/27/21 1807     Education Details HEP update: Access Code: [email or web address]    Person(s) Educated Patient    Methods Explanation;Demonstration;Handout    Comprehension Verbalized understanding;Returned demonstration              PT Short Term Goals - 04/27/21 1813       PT SHORT TERM GOAL #1   Title Patient will be independent with  initial HEP    Status Achieved   04/27/21   Target Date 04/27/21               PT Long Term Goals - 04/20/21 1812       PT LONG TERM GOAL #1   Title Patient will be independent with ongoing/advanced HEP for self-management at home    Status On-going      PT LONG TERM GOAL #2   Title Improve posture and alignment with patient to demonstrate improved upright posture with posterior shoulder girdle engaged    Status On-going      PT LONG TERM GOAL #3   Title Decrease R sided neck pain by >/= 50-75% allowing patient increased ease of cervical ROM and improved comfort in preferred sleeping position    Status On-going      PT LONG TERM GOAL #4   Title Patient to improve cervical AROM to [facility] without pain provocation    Status On-going      PT LONG TERM GOAL #5   Title Patient will demonstrate improved B shoulder strength to >/= 4+/5 for functional UE use    Status On-going      PT LONG TERM GOAL #6   Title Patient will report no sleep disturbance due to R sided neck pain    Status On-going                   Plan - 04/27/21 1807     Clinical Impression Statement Pt denied having any orthostatic issues since her episode last week. She reports that doctors have ruled out any mini strokes and CAV issues but found that she has a sinus infection. STM done today to relieve pressure on the base of her skull and behind her ears in the suboccipital muscles. Applied moist heat during session to allow for tissue extensibility and performed exercises while using. Cues needed for scapular retraction with the TB exercies and cues needed for proper motion with the neck stretches. Added UT and levator stretches to HEP to improve neck ROM and reduce muscle gaurding.    Personal Factors and Comorbidities Age;Behavior Pattern;Comorbidity 3+;Fitness;Profession;Past/Current Experience;Time since onset of injury/illness/exacerbation  Comorbidities HTN, migraine, asthma, sinusitis,  pre-diabetes/DM-II, GERD, OSA, thyroid cancer s/p thyroidectomy 2002, LBP with L sciatica, DJD, OA, insomnia, R CTR    PT Frequency 2x / week    PT Duration 8 weeks    PT Treatment/Interventions ADLs/Self Care Home Management;Cryotherapy;Electrical Stimulation;Iontophoresis 4mg /ml Dexamethasone;Moist Heat;Traction;Ultrasound;Functional mobility training;Therapeutic activities;Therapeutic exercise;Neuromuscular re-education;Patient/family education;Manual techniques;Passive range of motion;Dry needling;Taping;Spinal Manipulations;Joint Manipulations    PT Next Visit Plan manual work in suboccipitals and upper shoulders; postural awareness and stretching; progress postural strengthening and update HEP;    PT Home Exercise Plan Access Code: [email or web address] (9/13)    Consulted and Agree with Plan of Care Patient             Patient will benefit from skilled therapeutic intervention in order to improve the following deficits and impairments:  Decreased activity tolerance, Decreased endurance, Decreased knowledge of precautions, Decreased mobility, Decreased range of motion, Decreased strength, Increased fascial restricitons, Increased muscle spasms, Impaired perceived functional ability, Impaired flexibility, Impaired UE functional use, Improper body mechanics, Postural dysfunction, Pain  Visit Diagnosis: Cervicalgia  Abnormal posture  Muscle weakness (generalized)  Other symptoms and signs involving the musculoskeletal system  Other muscle spasm     Problem List Patient Active Problem List   Diagnosis Date Noted   Cervical cancer screening 03/07/2021   Sinusitis 10/30/2020   Allergic conjunctivitis 04/08/2020   Neck pain 02/06/2020   Acute bronchitis 01/13/2020   Otitis media 09/30/2019   Hip pain, right 09/30/2019   COVID-19 09/30/2019   Educated about COVID-19 virus infection 12/05/2018   High risk heterosexual behavior 11/20/2017   Baker's cyst of knee, right 02/16/2017   Insomnia  07/23/2016   Hypokalemia 07/23/2016   Controlled type 2 diabetes mellitus without complication, without long-term current use of insulin ([place]) 01/24/2016   Preventative health care 01/18/2016   Left-sided low back pain with left-sided sciatica 10/07/2015   Obesity (BMI 30-39.9) 06/05/2015   OSA (obstructive sleep apnea) 02/02/2015   Palpitations 09/19/2014   Plantar fasciitis, bilateral 11/19/2013   Pronation deformity of ankle, acquired 11/19/2013   DJD (degenerative joint disease) 08/26/2013   Fibrocystic breast 08/26/2013   HTN (hypertension) 08/26/2013   GERD (gastroesophageal reflux disease) 08/26/2013   S/P hysterectomy with oophorectomy 08/26/2013   Hypothyroidism 08/26/2013   Chronic hoarseness 08/26/2013   Colon polyp 08/26/2013   Hiatal hernia 08/26/2013   Irritable bowel syndrome 08/26/2013   Moderate persistent asthma 08/26/2013   Allergic rhinoconjunctivitis 08/26/2013   Hyperlipidemia 08/22/2013   Migraine 08/22/2013   Thyroid cancer ([place]) 08/22/2013    Artist Pais, PTA 04/27/2021, 6:14 PM  [facility] [address]  [place], [place], [address] Phone: [phone]   Fax:  [phone]  Name: Kyliana Standen MRN: [id number] Date of Birth: 1950/08/09

## 2021-04-28 ENCOUNTER — Ambulatory Visit (INDEPENDENT_AMBULATORY_CARE_PROVIDER_SITE_OTHER): Payer: Medicare Other | Admitting: Obstetrics & Gynecology

## 2021-04-28 ENCOUNTER — Encounter: Payer: Self-pay | Admitting: Obstetrics & Gynecology

## 2021-04-28 ENCOUNTER — Other Ambulatory Visit: Payer: Self-pay

## 2021-04-28 ENCOUNTER — Telehealth: Payer: Self-pay | Admitting: General Practice

## 2021-04-28 VITALS — BP 133/62 | HR 79 | Ht 62.0 in | Wt 204.0 lb

## 2021-04-28 DIAGNOSIS — Z01419 Encounter for gynecological examination (general) (routine) without abnormal findings: Secondary | ICD-10-CM

## 2021-04-28 DIAGNOSIS — Z1231 Encounter for screening mammogram for malignant neoplasm of breast: Secondary | ICD-10-CM

## 2021-04-28 DIAGNOSIS — N952 Postmenopausal atrophic vaginitis: Secondary | ICD-10-CM

## 2021-04-28 MED ORDER — ESTRADIOL 0.1 MG/GM VA CREA: 1.0000 | TOPICAL_CREAM | Freq: Every day | VAGINAL | 12 refills | Status: DC

## 2021-04-28 NOTE — Progress Notes (Signed)
Subjective:     Monica Wallace is a 70 y.o. female here for a routine exam. Pt is s/p hyst. Current complaints: recently sexually active. Has some discomfort due to dryness. Used lubrication but, still had pain.   No other GYN concerns.   Was from [place]. Lived in [place] for >17 years and [place]. ([place]).  Now back in [place] since 2014 (?). 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 Plans to work 'until she doesn't want to'.      Gynecologic History No LMP recorded. Patient has had a hysterectomy. Contraception: status post hysterectomy Last Pap: n/a Last mammogram: 05/20/2020. Results were: normal  Obstetric History OB History  Gravida Para Term Preterm AB Living  2         2  SAB IAB Ectopic Multiple Live Births               # Outcome Date GA Lbr Len/2nd Weight Sex Delivery Anes PTL Lv  2 Gravida           1 Gravida             The following portions of the patient's history were reviewed and updated as appropriate: allergies, current medications, past family history, past medical history, past social history, past surgical history, and problem list.  Review of Systems Pertinent items are noted in HPI.    Objective:  BP 133/62   Pulse 79   Ht 5\' 2"  (4.680 m)   Wt 204 lb (92.5 kg)   BMI 37.31 kg/m   General Appearance:    Alert, cooperative, no distress, appears stated age  Head:    Normocephalic, without obvious abnormality, atraumatic  Eyes:    conjunctiva/corneas clear, EOM's intact, both eyes  Ears:    Normal external ear canals, both ears  Nose:   Nares normal, septum midline, mucosa normal, no drainage    or sinus tenderness  Throat:   Lips, mucosa, and tongue normal; teeth and gums normal  Neck:   Supple, symmetrical, trachea midline, no adenopathy;    thyroid:  no enlargement/tenderness/nodules  Back:     Symmetric, no curvature, ROM normal, no CVA tenderness  Lungs:     respirations unlabored  Chest Wall:    No tenderness or  deformity   Heart:    Regular rate and rhythm  Breast Exam:    No tenderness, masses, or nipple abnormality  Abdomen:     Soft, non-tender, bowel sounds active all four quadrants,    no masses, no organomegaly  Genitalia:    Normal female without lesion, discharge or tenderness   No evidence of trauma. Adequate lubrication. Some atrophy noted. C/w menopausal state.   Extremities:   Extremities normal, atraumatic, no cyanosis or edema  Pulses:   2+ and symmetric all extremities  Skin:   Skin color, texture, turgor normal, no rashes or lesions    Assessment:    Healthy female exam.  Menopausal state   Plan:  Monica Wallace was seen today for gynecologic exam.  Diagnoses and all orders for this visit:  Well female exam with routine gynecological exam  Breast cancer screening by mammogram -     Cancel: MM DIGITAL SCREENING BILATERAL; Future -     MM 3D SCREEN BREAST BILATERAL; Future  Atrophic vulvovaginitis -     estradiol (ESTRACE VAGINAL) 0.1 MG/GM vaginal cream; Place 1 Applicatorful vaginally at bedtime.   F/u in 1 year or sooner prn  Thai Hemrick L. Harraway-Smith, M.D., Cherlynn June

## 2021-04-28 NOTE — Telephone Encounter (Signed)
Left message on VM informing patient of Mammogram schedule at [facility] in [place] on 05/26/2021 at 7:40am arriving at 7:20am.

## 2021-05-03 ENCOUNTER — Ambulatory Visit: Payer: Medicare Other | Admitting: Physical Therapy

## 2021-05-03 ENCOUNTER — Encounter: Payer: Self-pay | Admitting: Physical Therapy

## 2021-05-03 ENCOUNTER — Other Ambulatory Visit: Payer: Self-pay

## 2021-05-03 DIAGNOSIS — R29898 Other symptoms and signs involving the musculoskeletal system: Secondary | ICD-10-CM

## 2021-05-03 DIAGNOSIS — M542 Cervicalgia: Secondary | ICD-10-CM | POA: Diagnosis not present

## 2021-05-03 DIAGNOSIS — R293 Abnormal posture: Secondary | ICD-10-CM

## 2021-05-03 DIAGNOSIS — M62838 Other muscle spasm: Secondary | ICD-10-CM | POA: Diagnosis not present

## 2021-05-03 DIAGNOSIS — M6281 Muscle weakness (generalized): Secondary | ICD-10-CM | POA: Diagnosis not present

## 2021-05-03 NOTE — Therapy (Signed)
[facility] [address]  [place], [place], [address] Phone: [phone]   Fax:  [phone]  Physical Therapy Treatment  Patient Details  Name: Monica Wallace MRN: [id number] Date of Birth: 03/15/1951 Referring Provider (PT): [name], MD   Encounter Date: 05/03/2021   PT End of Session - 05/03/21 1709     Visit Number 4    Number of Visits 16    Date for PT Re-Evaluation 05/25/21    Authorization Type Medicare & [employer]    PT Start Time 1709    PT Stop Time 9169    PT Time Calculation (min) 43 min    Activity Tolerance Patient tolerated treatment well    Behavior During Therapy [facility] for tasks assessed/performed             Past Medical History:  Diagnosis Date   Arthritis    Asthma    Baker's cyst of knee, right 02/16/2017   Cancer ([place])    thyroid (2002)   Cystic breast    Diabetes ([place])    GERD (gastroesophageal reflux disease)    Hepatic cyst 02/02/2015   Hyperlipidemia    Hypertension    Insomnia 07/23/2016   Menopause    Migraine    Palpitations    Preventative health care 01/18/2016   Sleep apnea 02/02/2015   Thyroid disease     Past Surgical History:  Procedure Laterality Date   ABDOMINAL HYSTERECTOMY     took right ovary and uterus   APPENDECTOMY     CARPAL TUNNEL RELEASE Right    CESAREAN SECTION     CHOLECYSTECTOMY     EYE SURGERY Bilateral 2017   cataracts   KNEE ARTHROPLASTY     SHOULDER ARTHROSCOPY W/ ROTATOR CUFF REPAIR Left    THYROIDECTOMY  2002    There were no vitals filed for this visit.   Subjective Assessment - 05/03/21 1715     Subjective Pt reports her muscles seem to be be more relaxed and the pain that brought her to PT is basically down to 0/10. She reports she has a sinus infection with tenderness in her glands and near the mastoid process.    Diagnostic tests 02/22/20 -cervical MRI: 1. Moderate right C3-4 and C4-5 neural foraminal stenosis.  2.  Moderate left C5-6 neural foraminal stenosis.  3. No spinal canal stenosis.    Patient Stated Goals "find ways to relax the muscles more and have less pain so I can sleep better"    Currently in Pain? Yes    Pain Score 3     Pain Location Neck    Pain Orientation Lower;Right;Lateral    Pain Descriptors / Indicators Sore    Pain Type Acute pain    Pain Frequency Intermittent                [facility] PT Assessment - 05/03/21 1709       AROM   Cervical Flexion 56    Cervical Extension 36    Cervical - Right Side Bend 29    Cervical - Left Side Bend 27    Cervical - Right Rotation 48    Cervical - Left Rotation 50      Strength   Right Shoulder Flexion 4+/5    Right Shoulder ABduction 4/5    Right Shoulder Internal Rotation 4+/5    Right Shoulder External Rotation 4-/5    Left Shoulder Flexion 4+/5    Left  Shoulder ABduction 4+/5    Left Shoulder Internal Rotation 4+/5    Left Shoulder External Rotation 4/5                           [facility] Adult PT Treatment/Exercise - 05/03/21 1709       Exercises   Exercises Neck      Neck Exercises: Machines for Strengthening   UBE (Upper Arm Bike) L1.0 x 6 min (3' each way fwd/back)      Neck Exercises: Theraband   Shoulder Extension 20 reps;Red    Shoulder Extension Limitations standing    Rows 20 reps;Red    Rows Limitations cues for scap retraction tucking elbows into sides   standing   Shoulder External Rotation 10 reps;Red    Shoulder External Rotation Limitations cues for scap retraction   hooklying   Horizontal ABduction 10 reps;Red    Horizontal ABduction Limitations cues for scap retraction   hooklying   Other Theraband Exercises Alt red TB diagonals + scap retraction x 10   hooklying     Neck Exercises: Stretches   Other Neck Stretches R ant/mid/post scalene stretch with towel over shoulder x 30 sec each      Manual Therapy   Manual Therapy Soft tissue mobilization;Myofascial release    Soft tissue  mobilization STM to B cervical paraspinals, UT & scalenes    Myofascial Release B suboccipital release                       PT Short Term Goals - 04/27/21 1813       PT SHORT TERM GOAL #1   Title Patient will be independent with initial HEP    Status Achieved   04/27/21   Target Date 04/27/21               PT Long Term Goals - 05/03/21 1726       PT LONG TERM GOAL #1   Title Patient will be independent with ongoing/advanced HEP for self-management at home    Status Partially Met    Target Date 05/25/21      PT LONG TERM GOAL #2   Title Improve posture and alignment with patient to demonstrate improved upright posture with posterior shoulder girdle engaged    Status Partially Met    Target Date 05/25/21      PT LONG TERM GOAL #3   Title Decrease R sided neck pain by >/= 50-75% allowing patient increased ease of cervical ROM and improved comfort in preferred sleeping position    Status Partially Met    Target Date 05/25/21      PT LONG TERM GOAL #4   Title Patient to improve cervical AROM to [facility] without pain provocation    Status Partially Met    Target Date 05/25/21      PT LONG TERM GOAL #5   Title Patient will demonstrate improved B shoulder strength to >/= 4+/5 for functional UE use    Status Partially Met    Target Date 05/25/21      PT LONG TERM GOAL #6   Title Patient will report no sleep disturbance due to R sided neck pain    Status On-going    Target Date 05/25/21                   Plan - 05/03/21 1752     Clinical Impression Statement Monica Wallace reports pain  which initially brought her to PT is mostly resolved and she notes significant reduction in muscle tension tightness. Her cervical ROM has improved and is now mostly symmetrical with only mild continued limitation in B rotation. Her shoulder strength is also improved with no further pain noted with MMT resistance. She tolerated progression of TB resistance with strengthening  exercises well today and noted good stretch with isolation of lateral neck stretching to target scalenes. 30 of LTGs now at least partially met and based on current progress, anticipate she should be ready to transition to her HEP by the end of the month.    Comorbidities HTN, migraine, asthma, sinusitis, pre-diabetes/DM-II, GERD, OSA, thyroid cancer s/p thyroidectomy 2002, LBP with L sciatica, DJD, OA, insomnia, R CTR    Rehab Potential Good    PT Frequency 2x / week    PT Duration 8 weeks    PT Treatment/Interventions ADLs/Self Care Home Management;Cryotherapy;Electrical Stimulation;Iontophoresis 34m/ml Dexamethasone;Moist Heat;Traction;Ultrasound;Functional mobility training;Therapeutic activities;Therapeutic exercise;Neuromuscular re-education;Patient/family education;Manual techniques;Passive range of motion;Dry needling;Taping;Spinal Manipulations;Joint Manipulations    PT Next Visit Plan manual work in suboccipitals and upper shoulders; postural awareness and stretching; progress postural strengthening and update HEP;    PT Home Exercise Plan Access Code: [email or web address](9/13); [email or web address] (10/17)    Consulted and Agree with Plan of Care Patient             Patient will benefit from skilled therapeutic intervention in order to improve the following deficits and impairments:  Decreased activity tolerance, Decreased endurance, Decreased knowledge of precautions, Decreased mobility, Decreased range of motion, Decreased strength, Increased fascial restricitons, Increased muscle spasms, Impaired perceived functional ability, Impaired flexibility, Impaired UE functional use, Improper body mechanics, Postural dysfunction, Pain  Visit Diagnosis: Cervicalgia  Abnormal posture  Muscle weakness (generalized)  Other symptoms and signs involving the musculoskeletal system  Other muscle spasm     Problem List Patient Active Problem List   Diagnosis Date Noted   Cervical cancer screening  03/07/2021   Sinusitis 10/30/2020   Allergic conjunctivitis 04/08/2020   Neck pain 02/06/2020   Acute bronchitis 01/13/2020   Otitis media 09/30/2019   Hip pain, right 09/30/2019   COVID-19 09/30/2019   Educated about COVID-19 virus infection 12/05/2018   High risk heterosexual behavior 11/20/2017   Baker's cyst of knee, right 02/16/2017   Insomnia 07/23/2016   Hypokalemia 07/23/2016   Controlled type 2 diabetes mellitus without complication, without long-term current use of insulin ([place] 01/24/2016   Preventative health care 01/18/2016   Left-sided low back pain with left-sided sciatica 10/07/2015   Obesity (BMI 30-39.9) 06/05/2015   OSA (obstructive sleep apnea) 02/02/2015   Palpitations 09/19/2014   Plantar fasciitis, bilateral 11/19/2013   Pronation deformity of ankle, acquired 11/19/2013   DJD (degenerative joint disease) 08/26/2013   Fibrocystic breast 08/26/2013   HTN (hypertension) 08/26/2013   GERD (gastroesophageal reflux disease) 08/26/2013   S/P hysterectomy with oophorectomy 08/26/2013   Hypothyroidism 08/26/2013   Chronic hoarseness 08/26/2013   Colon polyp 08/26/2013   Hiatal hernia 08/26/2013   Irritable bowel syndrome 08/26/2013   Moderate persistent asthma 08/26/2013   Allergic rhinoconjunctivitis 08/26/2013   Hyperlipidemia 08/22/2013   Migraine 08/22/2013   [place] 08/22/2013    JPercival Spanish PT 05/03/2021, 7:15 PM  [facility] [address] [place] [address]Phone: [phone]  Fax:  [phone] Name: WMarybel AlcottMRN: [id number] of Birth: 8Sep 16, 1952

## 2021-05-04 ENCOUNTER — Telehealth: Payer: Self-pay

## 2021-05-04 NOTE — Telephone Encounter (Signed)
Pt called asking to have Estradiol vaginal cream filled at a compounding pharmacy because it is too expensive. Bi-Est E3 80-20 1 g use 0.5 mg QHS x 2 weeks and then use 3x  A week was verbally ordered at [facility]. Understanding was voiced. Lisaann Atha l Emelin Dascenzo, CMA

## 2021-05-05 ENCOUNTER — Ambulatory Visit: Payer: Medicare Other

## 2021-05-05 ENCOUNTER — Other Ambulatory Visit: Payer: Self-pay

## 2021-05-05 DIAGNOSIS — M62838 Other muscle spasm: Secondary | ICD-10-CM | POA: Diagnosis not present

## 2021-05-05 DIAGNOSIS — M6281 Muscle weakness (generalized): Secondary | ICD-10-CM

## 2021-05-05 DIAGNOSIS — R293 Abnormal posture: Secondary | ICD-10-CM

## 2021-05-05 DIAGNOSIS — R29898 Other symptoms and signs involving the musculoskeletal system: Secondary | ICD-10-CM

## 2021-05-05 DIAGNOSIS — M542 Cervicalgia: Secondary | ICD-10-CM | POA: Diagnosis not present

## 2021-05-05 NOTE — Therapy (Signed)
[facility] [address]  [place], [place], [address] Phone: [phone]   Fax:  [phone]  Physical Therapy Treatment  Patient Details  Name: Monica Wallace MRN: [id number] Date of Birth: Aug 14, 1950 Referring Provider (PT): [name], MD   Encounter Date: 05/05/2021   PT End of Session - 05/05/21 1750     Visit Number 5    Number of Visits 16    Date for PT Re-Evaluation 05/25/21    Authorization Type Medicare & [employer]    PT Start Time 1700    PT Stop Time 5852    PT Time Calculation (min) 58 min    Activity Tolerance Patient tolerated treatment well    Behavior During Therapy [facility] for tasks assessed/performed             Past Medical History:  Diagnosis Date   Arthritis    Asthma    Baker's cyst of knee, right 02/16/2017   Cancer ([place])    thyroid (2002)   Cystic breast    Diabetes ([place])    GERD (gastroesophageal reflux disease)    Hepatic cyst 02/02/2015   Hyperlipidemia    Hypertension    Insomnia 07/23/2016   Menopause    Migraine    Palpitations    Preventative health care 01/18/2016   Sleep apnea 02/02/2015   Thyroid disease     Past Surgical History:  Procedure Laterality Date   ABDOMINAL HYSTERECTOMY     took right ovary and uterus   APPENDECTOMY     CARPAL TUNNEL RELEASE Right    CESAREAN SECTION     CHOLECYSTECTOMY     EYE SURGERY Bilateral 2017   cataracts   KNEE ARTHROPLASTY     SHOULDER ARTHROSCOPY W/ ROTATOR CUFF REPAIR Left    THYROIDECTOMY  2002    There were no vitals filed for this visit.   Subjective Assessment - 05/05/21 1702     Subjective Pt reports that she feels like there is more going on in her neck, notes tenderness in her R lymph nodes but feels therapy is helping her neck mobility.    Diagnostic tests 02/22/20 -cervical MRI: 1. Moderate right C3-4 and C4-5 neural foraminal stenosis.  2. Moderate left C5-6 neural foraminal stenosis.  3. No spinal canal  stenosis.    Patient Stated Goals "find ways to relax the muscles more and have less pain so I can sleep better"    Currently in Pain? Yes    Pain Score 5     Pain Location Neck    Pain Orientation Right;Lateral    Pain Descriptors / Indicators Sore    Pain Type Acute pain                               [facility] Adult PT Treatment/Exercise - 05/05/21 0001       Exercises   Exercises Neck      Neck Exercises: Machines for Strengthening   UBE (Upper Arm Bike) L1.0 x 6 min (3' each way fwd/back)    Cybex Row 15# 2x10      Neck Exercises: Theraband   Shoulder External Rotation 20 reps    Shoulder External Rotation Limitations towel used for tactile feedback; yellow TB      Neck Exercises: Seated   Cervical Isometrics Extension;10 reps;3 secs    Cervical Isometrics Limitations resisted with towel behind head  Cervical Rotation Both;10 reps    Cervical Rotation Limitations SNAGs; more stiff going to R side    Shoulder Flexion Both;10 reps    Shoulder ABduction Both;10 reps    Other Seated Exercise cervical extension with neck supported with towel 20 reps      Neck Exercises: Stretches   Upper Trapezius Stretch Right;3 reps    Upper Trapezius Stretch Limitations 15 seconds; pin and stretch      Modalities   Modalities Moist Heat      Moist Heat Therapy   Number Minutes Moist Heat 10 Minutes    Moist Heat Location Cervical      Manual Therapy   Manual Therapy Soft tissue mobilization;Myofascial release    Soft tissue mobilization STM to B cervical paraspinals, LS, UT & scalenes    Myofascial Release B suboccipital release, UT pin and stretch                       PT Short Term Goals - 04/27/21 1813       PT SHORT TERM GOAL #1   Title Patient will be independent with initial HEP    Status Achieved   04/27/21   Target Date 04/27/21               PT Long Term Goals - 05/03/21 1726       PT LONG TERM GOAL #1   Title Patient  will be independent with ongoing/advanced HEP for self-management at home    Status Partially Met    Target Date 05/25/21      PT LONG TERM GOAL #2   Title Improve posture and alignment with patient to demonstrate improved upright posture with posterior shoulder girdle engaged    Status Partially Met    Target Date 05/25/21      PT LONG TERM GOAL #3   Title Decrease R sided neck pain by >/= 50-75% allowing patient increased ease of cervical ROM and improved comfort in preferred sleeping position    Status Partially Met    Target Date 05/25/21      PT LONG TERM GOAL #4   Title Patient to improve cervical AROM to [facility] without pain provocation    Status Partially Met    Target Date 05/25/21      PT LONG TERM GOAL #5   Title Patient will demonstrate improved B shoulder strength to >/= 4+/5 for functional UE use    Status Partially Met    Target Date 05/25/21      PT LONG TERM GOAL #6   Title Patient will report no sleep disturbance due to R sided neck pain    Status On-going    Target Date 05/25/21                   Plan - 05/05/21 1751     Clinical Impression Statement Pt well tolerated the treatment today. Progressed cervical ROM exercises today, noting more limitation with R cervical rotation due to mild pain but resolved with cues not to push ROM. Tactile cues for proper form and technique with the unilateral ER exercise. She demonstrated good tolerance for exercises. Very ttp on B suboccipitals and cervical paraspinals today along with tension throughout her shoulders and neck. She could be a candidate for DN if she is interested. Ended session with mosit heat to B neck and shoulders to decrease pain and increase tissue extensibility.    Personal Factors and Comorbidities Age;Behavior Pattern;Comorbidity  3+;Fitness;Profession;Past/Current Experience;Time since onset of injury/illness/exacerbation    Comorbidities HTN, migraine, asthma, sinusitis, pre-diabetes/DM-II, GERD,  OSA, thyroid cancer s/p thyroidectomy 2002, LBP with L sciatica, DJD, OA, insomnia, R CTR    PT Frequency 2x / week    PT Duration 8 weeks    PT Treatment/Interventions ADLs/Self Care Home Management;Cryotherapy;Electrical Stimulation;Iontophoresis 87m/ml Dexamethasone;Moist Heat;Traction;Ultrasound;Functional mobility training;Therapeutic activities;Therapeutic exercise;Neuromuscular re-education;Patient/family education;Manual techniques;Passive range of motion;Dry needling;Taping;Spinal Manipulations;Joint Manipulations    PT Next Visit Plan DN to neck and shoulder musculature: postural awareness and stretching; progress postural strengthening and update HEP;    PT Home Exercise Plan Access Code: [email or web address](9/13); [email or web address] (10/17)    Consulted and Agree with Plan of Care Patient             Patient will benefit from skilled therapeutic intervention in order to improve the following deficits and impairments:  Decreased activity tolerance, Decreased endurance, Decreased knowledge of precautions, Decreased mobility, Decreased range of motion, Decreased strength, Increased fascial restricitons, Increased muscle spasms, Impaired perceived functional ability, Impaired flexibility, Impaired UE functional use, Improper body mechanics, Postural dysfunction, Pain  Visit Diagnosis: Cervicalgia  Abnormal posture  Muscle weakness (generalized)  Other symptoms and signs involving the musculoskeletal system  Other muscle spasm     Problem List Patient Active Problem List   Diagnosis Date Noted   Cervical cancer screening 03/07/2021   Sinusitis 10/30/2020   Allergic conjunctivitis 04/08/2020   Neck pain 02/06/2020   Acute bronchitis 01/13/2020   Otitis media 09/30/2019   Hip pain, right 09/30/2019   COVID-19 09/30/2019   Educated about COVID-19 virus infection 12/05/2018   High risk heterosexual behavior 11/20/2017   Baker's cyst of knee, right 02/16/2017   Insomnia 07/23/2016    Hypokalemia 07/23/2016   Controlled type 2 diabetes mellitus without complication, without long-term current use of insulin ([place] 01/24/2016   Preventative health care 01/18/2016   Left-sided low back pain with left-sided sciatica 10/07/2015   Obesity (BMI 30-39.9) 06/05/2015   OSA (obstructive sleep apnea) 02/02/2015   Palpitations 09/19/2014   Plantar fasciitis, bilateral 11/19/2013   Pronation deformity of ankle, acquired 11/19/2013   DJD (degenerative joint disease) 08/26/2013   Fibrocystic breast 08/26/2013   HTN (hypertension) 08/26/2013   GERD (gastroesophageal reflux disease) 08/26/2013   S/P hysterectomy with oophorectomy 08/26/2013   Hypothyroidism 08/26/2013   Chronic hoarseness 08/26/2013   Colon polyp 08/26/2013   Hiatal hernia 08/26/2013   Irritable bowel syndrome 08/26/2013   Moderate persistent asthma 08/26/2013   Allergic rhinoconjunctivitis 08/26/2013   Hyperlipidemia 08/22/2013   Migraine 08/22/2013   Thyroid cancer ([place] 08/22/2013    BArtist Pais PTA 05/05/2021, 6:08 PM  [facility] [address] [place] [address]Phone: [phone]  Fax:  [phone] Name: WLeshea JaggersMRN: [id number] of Birth: 809-10-1950

## 2021-05-10 ENCOUNTER — Telehealth: Payer: Medicare Other

## 2021-05-11 ENCOUNTER — Other Ambulatory Visit: Payer: Self-pay

## 2021-05-11 ENCOUNTER — Telehealth: Payer: Self-pay | Admitting: *Deleted

## 2021-05-11 ENCOUNTER — Ambulatory Visit: Payer: Medicare Other

## 2021-05-11 DIAGNOSIS — R29898 Other symptoms and signs involving the musculoskeletal system: Secondary | ICD-10-CM | POA: Diagnosis not present

## 2021-05-11 DIAGNOSIS — M542 Cervicalgia: Secondary | ICD-10-CM | POA: Diagnosis not present

## 2021-05-11 DIAGNOSIS — M62838 Other muscle spasm: Secondary | ICD-10-CM

## 2021-05-11 DIAGNOSIS — M6281 Muscle weakness (generalized): Secondary | ICD-10-CM

## 2021-05-11 DIAGNOSIS — R293 Abnormal posture: Secondary | ICD-10-CM

## 2021-05-11 NOTE — Therapy (Addendum)
[facility] [address]  [place], [place], [address] Phone: [phone]   Fax:  [phone]  Physical Therapy Treatment / Discharge Summary  Patient Details  Name: Monica Wallace MRN: [id number] Date of Birth: [birth year] Referring Provider (PT): [name], MD   Encounter Date: 05/11/2021   PT End of Session - 05/11/21 1749     Visit Number 6    Number of Visits 16    Date for PT Re-Evaluation 05/25/21    Authorization Type Medicare & [employer]    PT Start Time 1703    PT Stop Time 1756    PT Time Calculation (min) 53 min    Activity Tolerance Patient tolerated treatment well    Behavior During Therapy [facility] for tasks assessed/performed             Past Medical History:  Diagnosis Date   Arthritis    Asthma    Baker's cyst of knee, right 02/16/2017   Cancer ([place])    thyroid (2002)   Cystic breast    Diabetes ([place])    GERD (gastroesophageal reflux disease)    Hepatic cyst 02/02/2015   Hyperlipidemia    Hypertension    Insomnia 07/23/2016   Menopause    Migraine    Palpitations    Preventative health care 01/18/2016   Sleep apnea 02/02/2015   Thyroid disease     Past Surgical History:  Procedure Laterality Date   ABDOMINAL HYSTERECTOMY     took right ovary and uterus   APPENDECTOMY     CARPAL TUNNEL RELEASE Right    CESAREAN SECTION     CHOLECYSTECTOMY     EYE SURGERY Bilateral 2017   cataracts   KNEE ARTHROPLASTY     SHOULDER ARTHROSCOPY W/ ROTATOR CUFF REPAIR Left    THYROIDECTOMY  2002    There were no vitals filed for this visit.   Subjective Assessment - 05/11/21 1711     Subjective Pt reports some pain on the base of her R skull, along the mastoid process, and R UT today, She helped bathe her mom the other day which she feels contributed.    Diagnostic tests 02/22/20 -cervical MRI: 1. Moderate right C3-4 and C4-5 neural foraminal stenosis.  2. Moderate left C5-6 neural foraminal  stenosis.  3. No spinal canal stenosis.    Patient Stated Goals "find ways to relax the muscles more and have less pain so I can sleep better"    Currently in Pain? Yes    Pain Score 3     Pain Location Neck    Pain Orientation Right;Posterior;Lateral    Pain Descriptors / Indicators Sore    Pain Type Chronic pain                               [facility] Adult PT Treatment/Exercise - 05/11/21 0001       Exercises   Exercises Neck      Neck Exercises: Machines for Strengthening   UBE (Upper Arm Bike) L2.0 x 6 min (3' each way fwd/back)      Neck Exercises: Theraband   Shoulder Extension 20 reps;Red    Rows 20 reps;Red    Rows Limitations cues for full ROM      Neck Exercises: Seated   Cervical Rotation Both;10 reps    Cervical Rotation Limitations SNAGs; more stiff going to R side  Neck Exercises: Stretches   Other Neck Stretches R scalenes stretch with towel over shoulder 3x15"      Modalities   Modalities Moist Heat      Moist Heat Therapy   Number Minutes Moist Heat 10 Minutes    Moist Heat Location Cervical      Manual Therapy   Manual Therapy Soft tissue mobilization;Myofascial release    Soft tissue mobilization STM to B cervical paraspinals, LS, UT & scalenes    Myofascial Release B suboccipital release                     PT Education - 05/11/21 1748     Education Details HEP update:Access Code: [email or web address]; red TB given for scap stab.    Person(s) Educated Patient    Methods Explanation;Demonstration;Handout    Comprehension Verbalized understanding;Returned demonstration              PT Short Term Goals - 04/27/21 1813       PT SHORT TERM GOAL #1   Title Patient will be independent with initial HEP    Status Achieved   04/27/21   Target Date 04/27/21               PT Long Term Goals - 05/03/21 1726       PT LONG TERM GOAL #1   Title Patient will be independent with ongoing/advanced HEP for  self-management at home    Status Partially Met    Target Date 05/25/21      PT LONG TERM GOAL #2   Title Improve posture and alignment with patient to demonstrate improved upright posture with posterior shoulder girdle engaged    Status Partially Met    Target Date 05/25/21      PT LONG TERM GOAL #3   Title Decrease R sided neck pain by >/= 50-75% allowing patient increased ease of cervical ROM and improved comfort in preferred sleeping position    Status Partially Met    Target Date 05/25/21      PT LONG TERM GOAL #4   Title Patient to improve cervical AROM to [facility] without pain provocation    Status Partially Met    Target Date 05/25/21      PT LONG TERM GOAL #5   Title Patient will demonstrate improved B shoulder strength to >/= 4+/5 for functional UE use    Status Partially Met    Target Date 05/25/21      PT LONG TERM GOAL #6   Title Patient will report no sleep disturbance due to R sided neck pain    Status On-going    Target Date 05/25/21                   Plan - 05/11/21 1750     Clinical Impression Statement Pt still noting pain along the R base of her skull, mastoid process, and UT today. She expressed interest in DN so we will plan this for future visits. Some cues needed with the rows for full ROM. She demonstrates less R cervical rotation than L today due to pain. Provided handout with scalenes stretch per pt request, with extensive cues for proper form and to keep shoulder depressed. B neck and shoulder pain better managed post STM and TPR and ended session with moist heat to decrease pain.    Personal Factors and Comorbidities Age;Behavior Pattern;Comorbidity 3+;Fitness;Profession;Past/Current Experience;Time since onset of injury/illness/exacerbation    Comorbidities HTN, migraine, asthma, sinusitis, pre-diabetes/DM-II,  GERD, OSA, thyroid cancer s/p thyroidectomy 2002, LBP with L sciatica, DJD, OA, insomnia, R CTR    PT Frequency 2x / week    PT Duration 8  weeks    PT Treatment/Interventions ADLs/Self Care Home Management;Cryotherapy;Electrical Stimulation;Iontophoresis 1m/ml Dexamethasone;Moist Heat;Traction;Ultrasound;Functional mobility training;Therapeutic activities;Therapeutic exercise;Neuromuscular re-education;Patient/family education;Manual techniques;Passive range of motion;Dry needling;Taping;Spinal Manipulations;Joint Manipulations    PT Next Visit Plan recert due to pt schedule; DN to suboccipitals, UT, LS, cervical paraspinals, rhomboids; postural awareness and stretching; progress postural strengthening and update HEP;    PT Home Exercise Plan Access Code: [email or web address](9/13); [email or web address] (10/17)    Consulted and Agree with Plan of Care Patient             Patient will benefit from skilled therapeutic intervention in order to improve the following deficits and impairments:  Decreased activity tolerance, Decreased endurance, Decreased knowledge of precautions, Decreased mobility, Decreased range of motion, Decreased strength, Increased fascial restricitons, Increased muscle spasms, Impaired perceived functional ability, Impaired flexibility, Impaired UE functional use, Improper body mechanics, Postural dysfunction, Pain  Visit Diagnosis: Cervicalgia  Abnormal posture  Muscle weakness (generalized)  Other symptoms and signs involving the musculoskeletal system  Other muscle spasm     Problem List Patient Active Problem List   Diagnosis Date Noted   Cervical cancer screening 03/07/2021   Sinusitis 10/30/2020   Allergic conjunctivitis 04/08/2020   Neck pain 02/06/2020   Acute bronchitis 01/13/2020   Otitis media 09/30/2019   Hip pain, right 09/30/2019   COVID-19 09/30/2019   Educated about COVID-19 virus infection 12/05/2018   High risk heterosexual behavior 11/20/2017   Baker's cyst of knee, right 02/16/2017   Insomnia 07/23/2016   Hypokalemia 07/23/2016   Controlled type 2 diabetes mellitus without complication,  without long-term current use of insulin ([place] 01/24/2016   Preventative health care 01/18/2016   Left-sided low back pain with left-sided sciatica 10/07/2015   Obesity (BMI 30-39.9) 06/05/2015   OSA (obstructive sleep apnea) 02/02/2015   Palpitations 09/19/2014   Plantar fasciitis, bilateral 11/19/2013   Pronation deformity of ankle, acquired 11/19/2013   DJD (degenerative joint disease) 08/26/2013   Fibrocystic breast 08/26/2013   HTN (hypertension) 08/26/2013   GERD (gastroesophageal reflux disease) 08/26/2013   S/P hysterectomy with oophorectomy 08/26/2013   Hypothyroidism 08/26/2013   Chronic hoarseness 08/26/2013   Colon polyp 08/26/2013   Hiatal hernia 08/26/2013   Irritable bowel syndrome 08/26/2013   Moderate persistent asthma 08/26/2013   Allergic rhinoconjunctivitis 08/26/2013   Hyperlipidemia 08/22/2013   Migraine 08/22/2013   Thyroid cancer ([place] 08/22/2013    BArtist Pais PTA 05/11/2021, 6:28 PM  [facility] [place] [address]Phone: [phone]  Fax:  [phone] Name: WRheya MinogueMRN: [id number] of Birth: [birth year]  PHYSICAL THERAPY DISCHARGE SUMMARY  Visits from Start of Care: 6  Current functional level related to goals / functional outcomes:   Refer to above clinical impression for status as of last visit on 05/11/2021. Patient had been unable to return to PT in >30 days due other medical issues and scheduling needs. A new referral was received with intent to re-eval today, however pt feels that the other medical concerns are more forefront for her now and she would like to hold off on PT until the other medical issues are addressed, therefore will proceed with discharge from PT for this episode.   Remaining deficits:   As above. Unable to formally assess  status at discharge due to failure to return to PT.   Education / Equipment:   HEP   Patient  agrees to discharge. Patient goals were partially met. Patient is being discharged due to not returning since the last visit.  Percival Spanish, PT, MPT 06/22/21, 5:31 PM  [facility] [address]  [place], [place], [address] Phone: [phone]   Fax:  [phone]

## 2021-05-11 NOTE — Chronic Care Management (AMB) (Signed)
  Care Management   Note  05/11/2021 Name: Monica Wallace MRN: [id number] DOB: 27-May-1951  Monica Wallace is a 70 y.o. year old female who is a primary care patient of [name], MD and is actively engaged with the care management team. I reached out to Myrtice Lauth by phone today to assist with re-scheduling an initial visit with the RN Case Manager  Follow up plan: Pt requests call back tomorrow afternoon after she is off work - will f/u   Julian Hy, [place], [place] Management  Direct Dial: [phone]

## 2021-05-12 NOTE — Chronic Care Management (AMB) (Signed)
  Chronic Care Management   Note  05/12/2021 Name: Murdis Flitton MRN: [id number] DOB: 08-23-1950  Tiona Ruane is a 70 y.o. year old female who is a primary care patient of [name], MD. I reached out to Myrtice Lauth by phone today in response to a referral sent by Ms. Kayra Savich's PCP.  Ms. Elbe was given information about Chronic Care Management services today including:  CCM service includes personalized support from designated clinical staff supervised by her physician, including individualized plan of care and coordination with other care providers 24/7 contact phone numbers for assistance for urgent and routine care needs. Service will only be billed when office clinical staff spend 20 minutes or more in a month to coordinate care. Only one practitioner may furnish and bill the service in a calendar month. The patient may stop CCM services at any time (effective at the end of the month) by phone call to the office staff. The patient is responsible for co-pay (up to 20% after annual deductible is met) if co-pay is required by the individual health plan.   Patient agreed to services and verbal consent obtained.   Follow up plan: Telephone appointment with care management team member scheduled for: 05/28/2021  Julian Hy, [place] Management  Direct Dial: [phone]

## 2021-05-13 ENCOUNTER — Encounter: Payer: Medicare Other | Admitting: Physical Therapy

## 2021-05-17 ENCOUNTER — Other Ambulatory Visit (HOSPITAL_BASED_OUTPATIENT_CLINIC_OR_DEPARTMENT_OTHER): Payer: Self-pay

## 2021-05-18 ENCOUNTER — Telehealth: Payer: Self-pay | Admitting: Family Medicine

## 2021-05-18 NOTE — Telephone Encounter (Signed)
Pt stated this is her second episode where she has gotten light headed and her legs felt like rubber,she stated it is very scary and she wants to know what the next steps should be. She was transferred to triage. Please advise

## 2021-05-18 NOTE — Telephone Encounter (Signed)
Pt scheduled with Dr. Nani Ravens on 05/19/21.  Access Nurse Call:  Caller states she had an episode, like the one she had before. She states she got up her legs were wobbling and head felt weird. States she was told she had a sinus infection and got medication. Then yesterday she had muscle spasms in her rib cage. She stated she bent down and when she stood up her felt funny and her legs were wobbly again. Head still doesn't feel right and legs are still weak.

## 2021-05-19 ENCOUNTER — Other Ambulatory Visit: Payer: Self-pay

## 2021-05-19 ENCOUNTER — Ambulatory Visit: Payer: Medicare Other | Admitting: Physical Therapy

## 2021-05-19 ENCOUNTER — Encounter (HOSPITAL_COMMUNITY): Payer: Self-pay | Admitting: *Deleted

## 2021-05-19 ENCOUNTER — Ambulatory Visit (INDEPENDENT_AMBULATORY_CARE_PROVIDER_SITE_OTHER): Payer: Medicare Other | Admitting: Family Medicine

## 2021-05-19 ENCOUNTER — Encounter: Payer: Self-pay | Admitting: Family Medicine

## 2021-05-19 ENCOUNTER — Emergency Department (HOSPITAL_COMMUNITY)
Admission: EM | Admit: 2021-05-19 | Discharge: 2021-05-19 | Disposition: A | Discharge status: Home / Self Care | Payer: Medicare Other | Attending: Emergency Medicine | Admitting: Emergency Medicine

## 2021-05-19 ENCOUNTER — Emergency Department (HOSPITAL_COMMUNITY): Payer: Medicare Other

## 2021-05-19 VITALS — BP 122/78 | HR 79 | Temp 98.1°F | Ht 62.0 in | Wt 204.2 lb

## 2021-05-19 DIAGNOSIS — I10 Essential (primary) hypertension: Secondary | ICD-10-CM | POA: Diagnosis not present

## 2021-05-19 DIAGNOSIS — R252 Cramp and spasm: Secondary | ICD-10-CM

## 2021-05-19 DIAGNOSIS — E039 Hypothyroidism, unspecified: Secondary | ICD-10-CM | POA: Diagnosis not present

## 2021-05-19 DIAGNOSIS — Z79899 Other long term (current) drug therapy: Secondary | ICD-10-CM | POA: Insufficient documentation

## 2021-05-19 DIAGNOSIS — R42 Dizziness and giddiness: Secondary | ICD-10-CM | POA: Diagnosis not present

## 2021-05-19 DIAGNOSIS — Z7951 Long term (current) use of inhaled steroids: Secondary | ICD-10-CM | POA: Diagnosis not present

## 2021-05-19 DIAGNOSIS — Z0389 Encounter for observation for other suspected diseases and conditions ruled out: Secondary | ICD-10-CM | POA: Diagnosis not present

## 2021-05-19 DIAGNOSIS — E119 Type 2 diabetes mellitus without complications: Secondary | ICD-10-CM | POA: Insufficient documentation

## 2021-05-19 DIAGNOSIS — R531 Weakness: Secondary | ICD-10-CM | POA: Diagnosis not present

## 2021-05-19 DIAGNOSIS — Z20822 Contact with and (suspected) exposure to covid-19: Secondary | ICD-10-CM | POA: Insufficient documentation

## 2021-05-19 DIAGNOSIS — Z8616 Personal history of COVID-19: Secondary | ICD-10-CM | POA: Insufficient documentation

## 2021-05-19 DIAGNOSIS — J45909 Unspecified asthma, uncomplicated: Secondary | ICD-10-CM | POA: Insufficient documentation

## 2021-05-19 DIAGNOSIS — Z85850 Personal history of malignant neoplasm of thyroid: Secondary | ICD-10-CM | POA: Diagnosis not present

## 2021-05-19 DIAGNOSIS — R55 Syncope and collapse: Secondary | ICD-10-CM

## 2021-05-19 LAB — CBC WITH DIFFERENTIAL/PLATELET
Abs Immature Granulocytes: 0.04 10*3/uL (ref 0.00–0.07)
Basophils Absolute: 0 10*3/uL (ref 0.0–0.1)
Basophils Relative: 0 %
Eosinophils Absolute: 0.1 10*3/uL (ref 0.0–0.5)
Eosinophils Relative: 1 %
HCT: 40.9 % (ref 36.0–46.0)
Hemoglobin: 13.5 g/dL (ref 12.0–15.0)
Immature Granulocytes: 0 %
Lymphocytes Relative: 28 %
Lymphs Abs: 2.7 10*3/uL (ref 0.7–4.0)
MCH: 29.7 pg (ref 26.0–34.0)
MCHC: 33 g/dL (ref 30.0–36.0)
MCV: 90.1 fL (ref 80.0–100.0)
Monocytes Absolute: 0.8 10*3/uL (ref 0.1–1.0)
Monocytes Relative: 8 %
Neutro Abs: 5.9 10*3/uL (ref 1.7–7.7)
Neutrophils Relative %: 63 %
Platelets: 290 10*3/uL (ref 150–400)
RBC: 4.54 MIL/uL (ref 3.87–5.11)
RDW: 13.5 % (ref 11.5–15.5)
WBC: 9.6 10*3/uL (ref 4.0–10.5)
nRBC: 0 % (ref 0.0–0.2)

## 2021-05-19 LAB — COMPREHENSIVE METABOLIC PANEL
ALT: 22 U/L (ref 0–35)
ALT: 24 U/L (ref 0–44)
AST: 23 U/L (ref 0–37)
AST: 27 U/L (ref 15–41)
Albumin: 4 g/dL (ref 3.5–5.2)
Albumin: 3.5 g/dL (ref 3.5–5.0)
Alkaline Phosphatase: 81 U/L (ref 39–117)
Alkaline Phosphatase: 69 U/L (ref 38–126)
Anion gap: 10 (ref 5–15)
BUN: 18 mg/dL (ref 6–23)
BUN: 17 mg/dL (ref 8–23)
CO2: 33 mEq/L — ABNORMAL HIGH (ref 19–32)
CO2: 30 mmol/L (ref 22–32)
Calcium: 9.3 mg/dL (ref 8.4–10.5)
Calcium: 9.3 mg/dL (ref 8.9–10.3)
Chloride: 98 mEq/L (ref 96–112)
Chloride: 98 mmol/L (ref 98–111)
Creatinine, Ser: 0.88 mg/dL (ref 0.40–1.20)
Creatinine, Ser: 0.87 mg/dL (ref 0.44–1.00)
GFR, Estimated: >60 mL/min (ref >60)
GFR: 65.74 mL/min (ref >60.00)
Glucose, Bld: 84 mg/dL (ref 70–99)
Glucose, Bld: 98 mg/dL (ref 70–99)
Potassium: 3.3 mEq/L — ABNORMAL LOW (ref 3.5–5.1)
Potassium: 3.4 mmol/L — ABNORMAL LOW (ref 3.5–5.1)
Sodium: 140 mEq/L (ref 135–145)
Sodium: 138 mmol/L (ref 135–145)
Total Bilirubin: 0.2 mg/dL (ref 0.2–1.2)
Total Bilirubin: 0.5 mg/dL (ref 0.3–1.2)
Total Protein: 6.8 g/dL (ref 6.0–8.3)
Total Protein: 6.9 g/dL (ref 6.5–8.1)

## 2021-05-19 LAB — CBC
HCT: 38.9 % (ref 36.0–46.0)
Hemoglobin: 12.9 g/dL (ref 12.0–15.0)
MCHC: 33.1 g/dL (ref 30.0–36.0)
MCV: 91 fl (ref 78.0–100.0)
Platelets: 293 10*3/uL (ref 150.0–400.0)
RBC: 4.28 Mil/uL (ref 3.87–5.11)
RDW: 13.7 % (ref 11.5–15.5)
WBC: 9.6 10*3/uL (ref 4.0–10.5)

## 2021-05-19 LAB — URINALYSIS, ROUTINE W REFLEX MICROSCOPIC
Bilirubin Urine: NEGATIVE
Glucose, UA: NEGATIVE mg/dL
Hgb urine dipstick: NEGATIVE
Ketones, ur: NEGATIVE mg/dL
Leukocytes,Ua: NEGATIVE
Nitrite: NEGATIVE
Protein, ur: NEGATIVE mg/dL
Specific Gravity, Urine: 1.018 (ref 1.005–1.030)
pH: 6 (ref 5.0–8.0)

## 2021-05-19 LAB — MAGNESIUM: Magnesium: 1.8 mg/dL (ref 1.5–2.5)

## 2021-05-19 LAB — TROPONIN I (HIGH SENSITIVITY): Troponin I (High Sensitivity): 3 ng/L (ref <18)

## 2021-05-19 LAB — RESP PANEL BY RT-PCR (FLU A&B, COVID) ARPGX2
Influenza A by PCR: NEGATIVE
Influenza B by PCR: NEGATIVE
SARS Coronavirus 2 by RT PCR: NEGATIVE

## 2021-05-19 MED ORDER — GADOBUTROL 1 MMOL/ML IV SOLN
9.0000 mL | Freq: Once | INTRAVENOUS | Status: AC | PRN
  Administered 2021-05-19: 9 mL via INTRAVENOUS

## 2021-05-19 MED ORDER — LORAZEPAM 2 MG/ML IJ SOLN
0.5000 mg | Freq: Once | INTRAMUSCULAR | Status: AC | PRN
  Administered 2021-05-19: 0.5 mg via INTRAVENOUS
  Filled 2021-05-19: qty 1

## 2021-05-19 NOTE — Patient Instructions (Signed)
Give us 2-3 business days to get the results of your labs back.   Stay hydrated.  Let us know if you need anything.  

## 2021-05-19 NOTE — Progress Notes (Signed)
Chief Complaint  Patient presents with   Dizziness    Subjective: Patient is a 70 y.o. female here for nearly passing out/lightheaded.  She was bending over and stood up and felt intense lightheadedness and wobbliness. She is not spinning. She had not been ill recently. She admits to not drinking as much water as she should. She has cramping in her legs and torso area. No blood in urine/stool, fevers, palpitations, dietary changes, no N/V/D. She has some sinus pressure, but gets far fewer sinus infections. This happened earlier in the month. BS and BP normal at both events. No illicit drug use, new supplements/meds.   Past Medical History:  Diagnosis Date   Arthritis    Asthma    Baker's cyst of knee, right 02/16/2017   Cancer ([place])    thyroid (2002)   Cystic breast    Diabetes ([place])    GERD (gastroesophageal reflux disease)    Hepatic cyst 02/02/2015   Hyperlipidemia    Hypertension    Insomnia 07/23/2016   Menopause    Migraine    Palpitations    Preventative health care 01/18/2016   Sleep apnea 02/02/2015   Thyroid disease     Objective: BP 138/82 (BP Location: Left Arm, Cuff Size: Normal)   Pulse 79   Temp 98.1 F (36.7 C) (Oral)   Ht 5\' 2"  (1.575 m)   Wt 204 lb 4 oz (92.6 kg)   SpO2 99%   BMI 37.36 kg/m  General: Awake, appears stated age Heart: RRR, no bruits Lungs: CTAB, no rales, wheezes or rhonchi. No accessory muscle use Psych: Age appropriate judgment and insight, normal affect and mood  Assessment and Plan: Pre-syncope - Plan: CBC, Comprehensive metabolic panel  Muscle cramping - Plan: Magnesium  New problem, uncertain prog. Ck labs. Consider cards referral. Orthostatics neg. Stay hydrated. May need to add salt.  The patient voiced understanding and agreement to the plan.  [place], DO 05/19/21  3:11 PM

## 2021-05-19 NOTE — ED Triage Notes (Signed)
Pt was in store, both legs became weak, but she was able to catch herself with her cart.  Similar episode 1 month prior.  Stroke work-up was negative.  Pt did not fall.  Was assisted to chair and was sitting when EMS arrived.  142/74 Hr 80 98% RA 102 CBG

## 2021-05-19 NOTE — ED Notes (Signed)
RN reviewed discharge instructions with pt. Pt verbalized understanding and had no further questions. VSS upon discharge.  

## 2021-05-19 NOTE — ED Provider Notes (Signed)
[facility] EMERGENCY DEPARTMENT Provider Note   CSN: [id number] Arrival date & time: 05/19/21  1618     History Chief Complaint  Patient presents with   Weakness    Monica Wallace is a 70 y.o. female with history of diabetes, reflux, presented emerge apartment with episodes of lightheadedness and leg weakness.  The patient reports that today was her third episode in the past month.  She reports that she was at the grocery store pushing a cart down the aisle, she suddenly began to feel "swimmy headed" and that both of her legs felt rubbery and weak.  She had to brace herself on the cart for support.  She says that the feeling gradually abated, EMS brought her into the ED.  Her blood sugar was 102 per EMS.  She says she feels back to normal here today.  She reports she took all of her medications per usual this morning, did not eat lunch.  Currently she denies any chest pain, palpitations, headache, lightheadedness.  She reports had a similar episode a few days ago which occurred after she was bending forward cleaning the back of her car.  When she tried to straighten up she became very lightheaded and her legs felt weak and she had to lay down.  Similarly, she reports 1 month ago she had an episode after she was sitting on the bed for a long period of time and stood up, and began to feel swimmy headed and weak.  She laid down and the symptoms gradually abated.  She does report a history of "maybe an arrhythmia" reporting that she had some "flutter issues" in the past.  She says she saw a cardiologist, had an echocardiogram and wore a telemetry monitor, was told everything was normal.  (Per my record review, Holter monitor worn in Feb 2019, "normal with no events"; echo 04/2017 with normal EF 78-67%, grade 1 diastolic dysfunction, no aortic stenosis or aneurysm, no mitral stenosis.)  She reports that she suffers from chronic posterior neck pain, was told that she has cervical  issues, was seen in orthopedics for this.  She does see physical therapy for this.  She denies any recent chiropractor manipulation or massage.   She reports she found out her daughter whom she lives with was tested positive for COVID.  The patient herself denies fevers, chills, respiratory symptoms recently.  HPI     Past Medical History:  Diagnosis Date   Arthritis    Asthma    Baker's cyst of knee, right 02/16/2017   Cancer ([place])    thyroid (2002)   Cystic breast    Diabetes ([place])    GERD (gastroesophageal reflux disease)    Hepatic cyst 02/02/2015   Hyperlipidemia    Hypertension    Insomnia 07/23/2016   Menopause    Migraine    Palpitations    Preventative health care 01/18/2016   Sleep apnea 02/02/2015   Thyroid disease     Patient Active Problem List   Diagnosis Date Noted   Cervical cancer screening 03/07/2021   Sinusitis 10/30/2020   Allergic conjunctivitis 04/08/2020   Neck pain 02/06/2020   Acute bronchitis 01/13/2020   Otitis media 09/30/2019   Hip pain, right 09/30/2019   COVID-19 09/30/2019   Educated about COVID-19 virus infection 12/05/2018   High risk heterosexual behavior 11/20/2017   Baker's cyst of knee, right 02/16/2017   Insomnia 07/23/2016   Hypokalemia 07/23/2016   Controlled type 2 diabetes mellitus without complication, without  long-term current use of insulin ([place]) 01/24/2016   Preventative health care 01/18/2016   Left-sided low back pain with left-sided sciatica 10/07/2015   Obesity (BMI 30-39.9) 06/05/2015   OSA (obstructive sleep apnea) 02/02/2015   Palpitations 09/19/2014   Plantar fasciitis, bilateral 11/19/2013   Pronation deformity of ankle, acquired 11/19/2013   DJD (degenerative joint disease) 08/26/2013   Fibrocystic breast 08/26/2013   HTN (hypertension) 08/26/2013   GERD (gastroesophageal reflux disease) 08/26/2013   S/P hysterectomy with oophorectomy 08/26/2013   Hypothyroidism 08/26/2013   Chronic hoarseness 08/26/2013    Colon polyp 08/26/2013   Hiatal hernia 08/26/2013   Irritable bowel syndrome 08/26/2013   Moderate persistent asthma 08/26/2013   Allergic rhinoconjunctivitis 08/26/2013   Hyperlipidemia 08/22/2013   Migraine 08/22/2013   Thyroid cancer ([place]) 08/22/2013    Past Surgical History:  Procedure Laterality Date   ABDOMINAL HYSTERECTOMY     took right ovary and uterus   APPENDECTOMY     CARPAL TUNNEL RELEASE Right    CESAREAN SECTION     CHOLECYSTECTOMY     EYE SURGERY Bilateral 2017   cataracts   KNEE ARTHROPLASTY     SHOULDER ARTHROSCOPY W/ ROTATOR CUFF REPAIR Left    THYROIDECTOMY  2002     OB History     Gravida  2   Para  2   Term  2   Preterm      AB      Living  2      SAB      IAB      Ectopic      Multiple      Live Births  2           Family History  Problem Relation Age of Onset   Arthritis Mother    Transient ischemic attack Mother    Hypertension Mother    Hyperlipidemia Mother    Diabetes Father    Heart disease Father    Arthritis Father    Kidney disease Father    Hypertension Father    Hyperlipidemia Father    Cancer Sister 17       pancreatic   Fibromyalgia Sister    Allergic rhinitis Sister    Asthma Maternal Grandfather        smoker   Depression Maternal Grandfather    Dementia Paternal Grandmother    Diabetes Paternal Grandfather    Hypertension Brother    Allergic rhinitis Brother    Scoliosis Daughter        had rods placed and removed   Arthritis Sister    Gout Sister    GER disease Sister    Cancer Sister        breast cancer   Breast cancer Sister 36    Social History   Tobacco Use   Smoking status: Never   Smokeless tobacco: Never  Vaping Use   Vaping Use: Never used  Substance Use Topics   Alcohol use: Yes    Alcohol/week: 0.0 standard drinks    Comment: very rare once or twice a year   Drug use: No    Home Medications Prior to Admission medications   Medication Sig Start Date End Date  Taking? Authorizing Provider  acetaminophen (TYLENOL) 325 MG tablet Take 650 mg by mouth every 6 (six) hours as needed.   Yes [provider]  albuterol (VENTOLIN HFA) 108 (90 Base) MCG/ACT inhaler INHALE 2 PUFFS INTO THE LUNGS EVERY 6 HOURS AS NEEDED FOR WHEEZING OR  SHORTNESS OF BREATH Patient taking differently: Inhale 2 puffs into the lungs every 6 (six) hours as needed for wheezing or shortness of breath. 10/01/19  Yes [name], MD  azelastine (ASTELIN) 0.1 % nasal spray Place 1 spray in each nostril twice a day as needed for runny nose/drainage down throat Patient taking differently: Place 1 spray into both nostrils daily. 11/19/20  Yes Althea Charon, FNP  budesonide (PULMICORT) 180 MCG/ACT inhaler Inhale 1-2 puffs into the lungs 2 (two) times daily. 11/19/20  Yes Althea Charon, FNP  Cholecalciferol ([employer] VIT D 5000 HIGH-POTENCY PO) Take 5,000 Units by mouth daily.   Yes [provider]  estradiol (ESTRACE VAGINAL) 0.1 MG/GM vaginal cream Place 1 Applicatorful vaginally at bedtime. 04/28/21  Yes Lavonia Drafts, MD  fluticasone (FLONASE) 50 MCG/ACT nasal spray Place 2 sprays into both nostrils daily. 10/29/20  Yes [name], MD  hyoscyamine (LEVSIN SL) 0.125 MG SL tablet Place 1 tablet (0.125 mg total) under the tongue every 4 (four) hours as needed. Patient taking differently: Place 0.125 mg under the tongue every 4 (four) hours as needed (For IBS per patient). 03/02/21  Yes [name], MD  loratadine (CLARITIN) 10 MG tablet Take 10 mg by mouth in the morning and at bedtime.    Yes [provider]  montelukast (SINGULAIR) 10 MG tablet TAKE 1 TABLET(10 MG) BY MOUTH DAILY AS NEEDED Patient taking differently: Take 10 mg by mouth at bedtime. 01/26/21  Yes Althea Charon, FNP  Omega-3 1000 MG CAPS Take 1,000 mg by mouth daily.   Yes [provider]  omeprazole (PRILOSEC) 40 MG capsule Take 1 capsule by mouth daily. 10/28/20  Yes [provider]  rosuvastatin (CRESTOR) 5 MG tablet TAKE 1 TABLET BY MOUTH 2 TIMES A WEEK(TUESDAYS AND SUNDAYS WEEKLY) Patient taking differently: Take 5 mg by mouth See admin instructions. Take one tablet by mouth on Tuesdays and Sundays Per patient 05/18/20  Yes [name], MD  SYNTHROID 150 MCG tablet TAKE 1 TABLET(150 MCG) BY MOUTH DAILY BEFORE AND BREAKFAST Patient taking differently: Take 150 mcg by mouth daily before breakfast. 04/02/21  Yes [name], MD  triamterene-hydrochlorothiazide (MAXZIDE) 75-50 MG tablet TAKE 1 TABLET BY MOUTH DAILY 04/02/21  Yes [name], MD  Blood Glucose Monitoring Suppl ([place]) w/Device KIT Use to check blood glucose once a day.  DX code: E11.9 02/06/20   [name], MD  fluconazole (DIFLUCAN) 150 MG tablet Take 1 tablet (150 mg total) by mouth once a week. Patient not taking: Reported on 05/19/2021 10/29/20   [name], MD  glucose blood (ONETOUCH VERIO) test strip Use to check blood glucose once a day.  DX code: E11.9 02/06/20   [name], MD  Lancets (ONETOUCH DELICA PLUS EOFHQR97J) MISC Use to check blood glucose once a day.  DX code: E11.9 02/06/20   [name], MD  potassium chloride (KLOR-CON 10) 10 MEQ tablet Take 2 tablets (20 mEq total) by mouth daily for 5 days. 05/20/21 05/25/21  Shelda Pal, DO    Allergies    Levaquin [levofloxacin], Clindamycin/lincomycin, Gabapentin, Penicillins, and Codeine  Review of Systems   Review of Systems  Constitutional:  Negative for chills and fever.  HENT:  Negative for ear pain and sore throat.   Eyes:  Negative for pain and visual disturbance.  Respiratory:  Negative for cough and shortness of breath.   Cardiovascular:  Negative for chest pain  and palpitations.  Gastrointestinal:  Negative for abdominal pain and vomiting.  Genitourinary:  Negative for dysuria and hematuria.  Musculoskeletal:  Negative for arthralgias and back pain.  Skin:   Negative for color change and rash.  Neurological:  Positive for dizziness, weakness and light-headedness. Negative for seizures, syncope, facial asymmetry, speech difficulty and headaches.  All other systems reviewed and are negative.  Physical Exam Updated Vital Signs BP 120/74   Pulse 62   Temp 98.7 F (37.1 C) (Oral)   Resp 13   SpO2 99%   Physical Exam Constitutional:      General: She is not in acute distress. HENT:     Head: Normocephalic and atraumatic.  Eyes:     Conjunctiva/sclera: Conjunctivae normal.     Pupils: Pupils are equal, round, and reactive to light.  Cardiovascular:     Rate and Rhythm: Normal rate and regular rhythm.     Pulses: Normal pulses.  Pulmonary:     Effort: Pulmonary effort is normal. No respiratory distress.  Abdominal:     General: There is no distension.     Tenderness: There is no abdominal tenderness.  Skin:    General: Skin is warm and dry.  Neurological:     General: No focal deficit present.     Mental Status: She is alert and oriented to person, place, and time. Mental status is at baseline.     Sensory: No sensory deficit.     Motor: No weakness.     Coordination: Coordination normal.  Psychiatric:        Mood and Affect: Mood normal.        Behavior: Behavior normal.    ED Results / Procedures / Treatments   Labs (all labs ordered are listed, but only abnormal results are displayed) Labs Reviewed  COMPREHENSIVE METABOLIC PANEL - Abnormal; Notable for the following components:      Result Value   Potassium 3.4 (*)    All other components within normal limits  RESP PANEL BY RT-PCR (FLU A&B, COVID) ARPGX2  CBC WITH DIFFERENTIAL/PLATELET  URINALYSIS, ROUTINE W REFLEX MICROSCOPIC  TROPONIN I (HIGH SENSITIVITY)    EKG EKG Interpretation  Date/Time:  Wednesday May 19 2021 16:57:23 EDT Ventricular Rate:  70 PR Interval:  168 QRS Duration: 90 QT Interval:  434 QTC Calculation: 468 R Axis:   2 Text  Interpretation: Normal sinus rhythm Nonspecific ST abnormality Abnormal ECG No significant change since prior 10/22 Confirmed by Aletta Edouard [phone]) on 05/19/2021 5:11:12 PM  Radiology CT HEAD WO CONTRAST (5MM)  Result Date: 05/19/2021 CLINICAL DATA:  TIA.  Weakness in legs. EXAM: CT HEAD WITHOUT CONTRAST TECHNIQUE: Contiguous axial images were obtained from the base of the skull through the vertex without intravenous contrast. COMPARISON:  CT head 04/21/2021 FINDINGS: Brain: No evidence of acute infarction, hemorrhage, hydrocephalus, extra-axial collection or mass lesion/mass effect. Vascular: Negative for hyperdense vessel Skull: Negative Sinuses/Orbits: Mild mucosal edema paranasal sinuses. Negative orbit Other: None IMPRESSION: Negative CT head Electronically Signed   By: Franchot Gallo M.D.   On: 05/19/2021 17:43   MR ANGIO HEAD WO CONTRAST  Result Date: 05/19/2021 EXAM: MRI HEAD WITHOUT CONTRAST MRA HEAD WITHOUT CONTRAST MRA NECK WITHOUT AND WITH CONTRAST TECHNIQUE: Multiplanar, multi-echo pulse sequences of the brain and surrounding structures were acquired without intravenous contrast. Angiographic images of the Circle of Willis were acquired using MRA technique without intravenous contrast. Angiographic images of the neck were acquired using MRA technique without and with intravenous contrast.  Carotid stenosis measurements (when applicable) are obtained utilizing NASCET criteria, using the distal internal carotid diameter as the denominator. CONTRAST:  11m GADAVIST GADOBUTROL 1 MMOL/ML IV SOLN COMPARISON:  None FINDINGS: MRI HEAD FINDINGS Brain: No acute infarct, mass effect or extra-axial collection. No acute or chronic hemorrhage. Normal white matter signal, parenchymal volume and CSF spaces. The midline structures are normal. Vascular: Major flow voids are preserved. Skull and upper cervical spine: Normal calvarium and skull base. Visualized upper cervical spine and soft tissues are normal.  Sinuses/Orbits:No paranasal sinus fluid levels or advanced mucosal thickening. No mastoid or middle ear effusion. Normal orbits. MRA HEAD FINDINGS POSTERIOR CIRCULATION: --Vertebral arteries: Normal --Inferior cerebellar arteries: Normal. --Basilar artery: Normal. --Superior cerebellar arteries: Normal. --Posterior cerebral arteries: Normal. ANTERIOR CIRCULATION: --Intracranial internal carotid arteries: Normal. --Anterior cerebral arteries (ACA): Normal. --Middle cerebral arteries (MCA): Normal. ANATOMIC VARIANTS: None MRA NECK FINDINGS Aortic arch: Normal branching pattern Right carotid system: Normal Left carotid system: Normal Vertebral arteries: Codominant and normal Other: None IMPRESSION: 1. Normal MRI of the brain. 2. Normal MRA of the head and neck. Electronically Signed   By: KUlyses JarredM.D.   On: 05/19/2021 21:57   MR Angiogram Neck W or Wo Contrast  Result Date: 05/19/2021 EXAM: MRI HEAD WITHOUT CONTRAST MRA HEAD WITHOUT CONTRAST MRA NECK WITHOUT AND WITH CONTRAST TECHNIQUE: Multiplanar, multi-echo pulse sequences of the brain and surrounding structures were acquired without intravenous contrast. Angiographic images of the Circle of Willis were acquired using MRA technique without intravenous contrast. Angiographic images of the neck were acquired using MRA technique without and with intravenous contrast. Carotid stenosis measurements (when applicable) are obtained utilizing NASCET criteria, using the distal internal carotid diameter as the denominator. CONTRAST:  911mGADAVIST GADOBUTROL 1 MMOL/ML IV SOLN COMPARISON:  None FINDINGS: MRI HEAD FINDINGS Brain: No acute infarct, mass effect or extra-axial collection. No acute or chronic hemorrhage. Normal white matter signal, parenchymal volume and CSF spaces. The midline structures are normal. Vascular: Major flow voids are preserved. Skull and upper cervical spine: Normal calvarium and skull base. Visualized upper cervical spine and soft tissues are  normal. Sinuses/Orbits:No paranasal sinus fluid levels or advanced mucosal thickening. No mastoid or middle ear effusion. Normal orbits. MRA HEAD FINDINGS POSTERIOR CIRCULATION: --Vertebral arteries: Normal --Inferior cerebellar arteries: Normal. --Basilar artery: Normal. --Superior cerebellar arteries: Normal. --Posterior cerebral arteries: Normal. ANTERIOR CIRCULATION: --Intracranial internal carotid arteries: Normal. --Anterior cerebral arteries (ACA): Normal. --Middle cerebral arteries (MCA): Normal. ANATOMIC VARIANTS: None MRA NECK FINDINGS Aortic arch: Normal branching pattern Right carotid system: Normal Left carotid system: Normal Vertebral arteries: Codominant and normal Other: None IMPRESSION: 1. Normal MRI of the brain. 2. Normal MRA of the head and neck. Electronically Signed   By: KeUlyses Jarred.D.   On: 05/19/2021 21:57   MR BRAIN WO CONTRAST  Result Date: 05/19/2021 EXAM: MRI HEAD WITHOUT CONTRAST MRA HEAD WITHOUT CONTRAST MRA NECK WITHOUT AND WITH CONTRAST TECHNIQUE: Multiplanar, multi-echo pulse sequences of the brain and surrounding structures were acquired without intravenous contrast. Angiographic images of the Circle of Willis were acquired using MRA technique without intravenous contrast. Angiographic images of the neck were acquired using MRA technique without and with intravenous contrast. Carotid stenosis measurements (when applicable) are obtained utilizing NASCET criteria, using the distal internal carotid diameter as the denominator. CONTRAST:  47m63mADAVIST GADOBUTROL 1 MMOL/ML IV SOLN COMPARISON:  None FINDINGS: MRI HEAD FINDINGS Brain: No acute infarct, mass effect or extra-axial collection. No acute or chronic hemorrhage. Normal white matter signal,  parenchymal volume and CSF spaces. The midline structures are normal. Vascular: Major flow voids are preserved. Skull and upper cervical spine: Normal calvarium and skull base. Visualized upper cervical spine and soft tissues are  normal. Sinuses/Orbits:No paranasal sinus fluid levels or advanced mucosal thickening. No mastoid or middle ear effusion. Normal orbits. MRA HEAD FINDINGS POSTERIOR CIRCULATION: --Vertebral arteries: Normal --Inferior cerebellar arteries: Normal. --Basilar artery: Normal. --Superior cerebellar arteries: Normal. --Posterior cerebral arteries: Normal. ANTERIOR CIRCULATION: --Intracranial internal carotid arteries: Normal. --Anterior cerebral arteries (ACA): Normal. --Middle cerebral arteries (MCA): Normal. ANATOMIC VARIANTS: None MRA NECK FINDINGS Aortic arch: Normal branching pattern Right carotid system: Normal Left carotid system: Normal Vertebral arteries: Codominant and normal Other: None IMPRESSION: 1. Normal MRI of the brain. 2. Normal MRA of the head and neck. Electronically Signed   By: Ulyses Jarred M.D.   On: 05/19/2021 21:57    Procedures Procedures   Medications Ordered in ED Medications  LORazepam (ATIVAN) injection 0.5 mg (0.5 mg Intravenous Given 05/19/21 1950)  gadobutrol (GADAVIST) 1 MMOL/ML injection 9 mL (9 mLs Intravenous Contrast Given 05/19/21 2102)    ED Course  I have reviewed the triage vital signs and the nursing notes.  Pertinent labs & imaging results that were available during my care of the patient were reviewed by me and considered in my medical decision making (see chart for details).  Differential diagnosis includes posterior vascular injury of the neck vs TIA vs cerebellar lesion vs arrhythmia vs anemia vs dehydration vs covid-19 vs hypoglycemia.  I personally reviewed the patient's outpatient records including her cardiac work-up in 2018 and 2019.  There were no reported arrhythmia events and her echocardiogram was unremarkable, with no evidence of significant aortic stenosis or mitral stenosis.  She does not have a prominent murmur on exam.  I doubt this is related to symptomatic valvular disease.  I personally reviewed and interpret her labs today.  COVID and flu  are negative.  Troponin is 3 (normal with symptoms > 6 hours ago), no evidence of anemia.  White blood cell count is normal.  Electrolytes and CMP largely unremarkable, very mild hypokalemia with potassium of 3.4, unlikely to be cause of her symptoms.  I also reviewed and interpreted the patient's EKG which shows a normal sinus rhythm with no evidence of arrhythmia.  Possible that these episodes were transient hypoglycemia.  EMS reported BS 100 on scene.  Pt not on insulin - denies prior hx of hypoglycemic episodes.  We discussed keeping snacks/sugar on her person if she feels this way in the future again.  She does not have evidence of acute stroke.  However we did discuss vascular imaging of her neck and brain and cerebellar imaging.  She has iodine contrast allergies.  I think an MRI would give [place] a better image of the posterior brain and would appropriately image her vertebral artery as well.  The patient is in agreement with this plan.  Prn ativan for anxiety prior to scan.  Clinical Course as of 05/20/21 0951  Wed May 19, 2021  2215 Normal MRI/MRA [MT]    Clinical Course User Index [MT] Langston Masker Carola Rhine, MD   MRI results reviewed with patient.  Fortunately no findings to explain her symptoms or suggest CVA/vascular injury.  Her sister will come pick her up.  I advised cardiology f/u for arrhythmia evaluation (she did wear a holter monitor a few years ago, but her symptoms were different at the time), and keeping glucose/food on her person, if she  gets dizzy again.   Final Clinical Impression(s) / ED Diagnoses Final diagnoses:  Lightheadedness    Rx / DC Orders ED Discharge Orders     None        Shana Younge, Carola Rhine, MD 05/20/21 [phone]

## 2021-05-19 NOTE — ED Provider Notes (Signed)
Emergency Medicine Provider Triage Evaluation Note  Monica Wallace , a 70 y.o. female  was evaluated in triage.  Pt complains of weakness.  She reports that she had left her doctors office from a follow up for previous presyncopal events.  She was walking at [place] and felt light headed and had to sit down.  CBG 102 per EMS.  She didn't take the maxide but took all her other medications this morning.  Didn't eat lunch.  She just found out family member who she lives with has covid. She states that her head just feels odd.  She feels like the weakness is the same bilaterally.  Review of Systems  Positive: Presyncope, abnormal head Negative: Syncope, chest pain, shortness of breath  Physical Exam  There were no vitals taken for this visit. Gen:   Awake, no distress   Resp:  Normal effort  MSK:   Moves extremities without difficulty No pronator drift.  5/5 strength bilateral upper and lower extremities.  Other:  Normal speech.  Sensation intact to light touch to bilateral upper and lower extremities, face.  Normal EOMs. No facial droop.  No neglect or vision cut noticed.  Medical Decision Making  Medically screening exam initiated at 4:48 PM.  Appropriate orders placed.  Monica Wallace was informed that the remainder of the evaluation will be completed by another provider, this initial triage assessment does not replace that evaluation, and the importance of remaining in the ED until their evaluation is complete.  Patient doesn't meet code stroke criteria.  No unilateral weakness.  No speech difficulty or aphasia.  Will obtain labs, EKG, CT head given her head feels abnormal.    Lorin Glass, PA-C 05/19/21 1653    Sherwood Gambler, MD 05/24/21 [phone]

## 2021-05-20 ENCOUNTER — Other Ambulatory Visit: Payer: Self-pay | Admitting: Family Medicine

## 2021-05-20 MED ORDER — POTASSIUM CHLORIDE ER 10 MEQ PO TBCR: 20.0000 meq | EXTENDED_RELEASE_TABLET | Freq: Every day | ORAL | 0 refills | Status: DC

## 2021-05-23 DIAGNOSIS — Z23 Encounter for immunization: Secondary | ICD-10-CM | POA: Diagnosis not present

## 2021-05-24 ENCOUNTER — Other Ambulatory Visit: Payer: Self-pay | Admitting: Family Medicine

## 2021-05-24 DIAGNOSIS — E876 Hypokalemia: Secondary | ICD-10-CM

## 2021-05-24 NOTE — Progress Notes (Signed)
Referring-Stacey Charlett Blake, MD Reason for referral-presyncope  HPI: 70 year old female for evaluation of presyncope at request of [name], MD. Previously seen by Dr. Gwenlyn Found but not since November 2016.  Echocardiogram October 2018 showed normal LV function, grade 1 diastolic dysfunction.  Monitor February 2019 showed normal sinus rhythm.  Symptoms of dizziness associated with sinus rhythm.  MRA of the head and neck November 2022 normal.  Patient seen recently in the emergency room for symptoms of presyncope.  Laboratories showed normal troponin, hemoglobin 13.5, potassium 3.4, creatinine 0.87.  Patient typically does not have significant dyspnea on exertion, orthopnea, PND, exertional chest pain or history of syncope.  Occasional minimal pedal edema.  She has had 3 presyncopal episodes recently.  The first occurred after she stood up from the seated position.  She became dizzy but no associated nausea, dyspnea, chest pain or palpitations.  She did not have frank syncope.  She had a second episode after bending down to clean her car.  The third episode occurred while she was walking in [place] again with no associated symptoms.  Each lasted approximately 10 minutes.  She did feel weak for 2 days after the first episode.  Cardiology now asked to evaluate.  Current Outpatient Medications  Medication Sig Dispense Refill   acetaminophen (TYLENOL) 325 MG tablet Take 650 mg by mouth every 6 (six) hours as needed.     azelastine (ASTELIN) 0.1 % nasal spray Place 1 spray in each nostril twice a day as needed for runny nose/drainage down throat (Patient taking differently: Place 1 spray into both nostrils daily.) 90 mL 1   Blood Glucose Monitoring Suppl (ONETOUCH VERIO FLEX SYSTEM) w/Device KIT Use to check blood glucose once a day.  DX code: E11.9 1 kit 0   budesonide (PULMICORT) 180 MCG/ACT inhaler Inhale 1-2 puffs into the lungs 2 (two) times daily. 3 each 1   Cholecalciferol ([employer] VIT D 5000 HIGH-POTENCY  PO) Take 5,000 Units by mouth daily.     estradiol (ESTRACE VAGINAL) 0.1 MG/GM vaginal cream Place 1 Applicatorful vaginally at bedtime. 42.5 g 12   fluticasone (FLONASE) 50 MCG/ACT nasal spray Place 2 sprays into both nostrils daily. 16 g 6   glucose blood (ONETOUCH VERIO) test strip Use to check blood glucose once a day.  DX code: E11.9 100 each 1   Lancets (ONETOUCH DELICA PLUS SKAJGO11X) MISC Use to check blood glucose once a day.  DX code: E11.9 100 each 1   loratadine (CLARITIN) 10 MG tablet Take 10 mg by mouth in the morning and at bedtime.      montelukast (SINGULAIR) 10 MG tablet TAKE 1 TABLET(10 MG) BY MOUTH DAILY AS NEEDED (Patient taking differently: Take 10 mg by mouth at bedtime.) 90 tablet 1   omeprazole (PRILOSEC) 40 MG capsule Take 1 capsule by mouth daily.     rosuvastatin (CRESTOR) 5 MG tablet TAKE 1 TABLET BY MOUTH 2 TIMES A WEEK(TUESDAYS AND SUNDAYS WEEKLY) (Patient taking differently: Take 5 mg by mouth See admin instructions. Take one tablet by mouth on Tuesdays and Sundays Per patient) 25 tablet 3   SYNTHROID 150 MCG tablet TAKE 1 TABLET(150 MCG) BY MOUTH DAILY BEFORE AND BREAKFAST (Patient taking differently: Take 150 mcg by mouth daily before breakfast.) 90 tablet 1   albuterol (VENTOLIN HFA) 108 (90 Base) MCG/ACT inhaler INHALE 2 PUFFS INTO THE LUNGS EVERY 6 HOURS AS NEEDED FOR WHEEZING OR SHORTNESS OF BREATH (Patient not taking: Reported on 05/26/2021) 25.5 g 1  fluconazole (DIFLUCAN) 150 MG tablet Take 1 tablet (150 mg total) by mouth once a week. (Patient not taking: Reported on 05/19/2021) 2 tablet 1   hyoscyamine (LEVSIN SL) 0.125 MG SL tablet Place 1 tablet (0.125 mg total) under the tongue every 4 (four) hours as needed. (Patient not taking: Reported on 05/26/2021) 30 tablet 1   Omega-3 1000 MG CAPS Take 1,000 mg by mouth daily. (Patient not taking: Reported on 05/26/2021)     No current facility-administered medications for this visit.    Allergies  Allergen  Reactions   Levaquin [Levofloxacin] Shortness Of Breath   Clindamycin/Lincomycin     Pruritus No rash   Gabapentin Itching   Penicillins     Positive on allergy testing 'years ago.' Has never had penicillin. Has tolerated amoxicillin.   Codeine Itching     Past Medical History:  Diagnosis Date   Arthritis    Asthma    Baker's cyst of knee, right 02/16/2017   Cancer ([place])    thyroid (2002)   Cystic breast    Diabetes ([place])    GERD (gastroesophageal reflux disease)    Hepatic cyst 02/02/2015   Hyperlipidemia    Hypertension    Insomnia 07/23/2016   Menopause    Migraine    Palpitations    Preventative health care 01/18/2016   Sleep apnea 02/02/2015   Thyroid disease     Past Surgical History:  Procedure Laterality Date   ABDOMINAL HYSTERECTOMY     took right ovary and uterus   APPENDECTOMY     CARPAL TUNNEL RELEASE Right    CESAREAN SECTION     CHOLECYSTECTOMY     EYE SURGERY Bilateral 2017   cataracts   KNEE ARTHROPLASTY     SHOULDER ARTHROSCOPY W/ ROTATOR CUFF REPAIR Left    THYROIDECTOMY  2002    Social History   Socioeconomic History   Marital status: Divorced    Spouse name: Not on file   Number of children: 2   Years of education: Not on file   Highest education level: Not on file  Occupational History   Not on file  Tobacco Use   Smoking status: Never   Smokeless tobacco: Never  Vaping Use   Vaping Use: Never used  Substance and Sexual Activity   Alcohol use: Yes    Alcohol/week: 0.0 standard drinks    Comment: very rare once or twice a year   Drug use: No   Sexual activity: Yes    Birth control/protection: Other-see comments, Surgical    Comment:  boyfriend  Other Topics Concern   Not on file  Social History Narrative   Not on file   Social Determinants of Health   Financial Resource Strain: Low Risk    Difficulty of Paying Living Expenses: Not hard at all  Food Insecurity: No Food Insecurity   Worried About Charity fundraiser in the  Last Year: Never true   [place] in the Last Year: Never true  Transportation Needs: No Transportation Needs   Lack of Transportation (Medical): No   Lack of Transportation (Non-Medical): No  Physical Activity: Inactive   Days of Exercise per Week: 0 days   Minutes of Exercise per Session: 0 min  Stress: No Stress Concern Present   Feeling of Stress : Not at all  Social Connections: Moderately Isolated   Frequency of Communication with Friends and Family: More than three times a week   Frequency of Social Gatherings with Friends  and Family: More than three times a week   Attends Religious Services: More than 4 times per year   Active Member of Clubs or Organizations: No   Attends Music therapist: Never   Marital Status: Divorced  Human resources officer Violence: Not At Risk   Fear of Current or Ex-Partner: No   Emotionally Abused: No   Physically Abused: No   Sexually Abused: No    Family History  Problem Relation Age of Onset   Arthritis Mother    Transient ischemic attack Mother    Hypertension Mother    Hyperlipidemia Mother    Diabetes Father    Heart disease Father    Arthritis Father    Kidney disease Father    Hypertension Father    Hyperlipidemia Father    Cancer Sister 95       pancreatic   Fibromyalgia Sister    Allergic rhinitis Sister    Asthma Maternal Grandfather        smoker   Depression Maternal Grandfather    Dementia Paternal Grandmother    Diabetes Paternal Grandfather    Hypertension Brother    Allergic rhinitis Brother    Scoliosis Daughter        had rods placed and removed   Arthritis Sister    Gout Sister    GER disease Sister    Cancer Sister        breast cancer   Breast cancer Sister 49    ROS: no fevers or chills, productive cough, hemoptysis, dysphasia, odynophagia, melena, hematochezia, dysuria, hematuria, rash, seizure activity, orthopnea, PND, pedal edema, claudication. Remaining systems are negative.  Physical  Exam:   Blood pressure 114/74, pulse 74, height _0  (1.575 m), weight 205 lb 3.2 oz (93.1 kg), SpO2 98 %.  General:  Well developed/well nourished in NAD Skin warm/dry Patient not depressed No peripheral clubbing Back-normal HEENT-normal/normal eyelids Neck supple/normal carotid upstroke bilaterally; no bruits; no JVD; no thyromegaly chest - CTA/ normal expansion CV - RRR/normal S1 and S2; no murmurs, rubs or gallops;  PMI nondisplaced Abdomen -NT/ND, no HSM, no mass, + bowel sounds, no bruit 2+ femoral pulses, no bruits Ext-no edema, chords, 2+ DP Neuro-grossly nonfocal  ECG -May 19, 2021-normal sinus rhythm with no ST changes.  Personally reviewed  A/P  1 presyncope-etiology unclear.  However first 2 episodes sound potentially orthostatic mediated.  I will arrange an echocardiogram to assess LV function.  I will discontinue her Maxide as her systolic blood pressure is 114 today and this could contribute to orthostasis.  I asked her to stay hydrated as well.  If she has more frequent episodes in the future can consider monitor.  2 history of palpitations-no recent episodes.  3 hypertension-blood pressure 114/74.  Orthostasis may be contributing to presyncope.  Plan to discontinue diuretic as outlined above.  She will follow her blood pressure at home and if it increases will plan to add antihypertensive that is not a diuretic.  Would let her blood pressure run slightly higher given her orthostatic symptoms.  4 hyperlipidemia-continue statin.  Kirk Ruths, MD

## 2021-05-26 ENCOUNTER — Ambulatory Visit (INDEPENDENT_AMBULATORY_CARE_PROVIDER_SITE_OTHER): Payer: Medicare Other | Admitting: Cardiology

## 2021-05-26 ENCOUNTER — Ambulatory Visit: Payer: Medicare Other

## 2021-05-26 ENCOUNTER — Encounter: Payer: Self-pay | Admitting: Cardiology

## 2021-05-26 ENCOUNTER — Other Ambulatory Visit: Payer: Self-pay

## 2021-05-26 VITALS — BP 114/74 | HR 74 | Ht 62.0 in | Wt 205.2 lb

## 2021-05-26 DIAGNOSIS — I10 Essential (primary) hypertension: Secondary | ICD-10-CM

## 2021-05-26 DIAGNOSIS — R55 Syncope and collapse: Secondary | ICD-10-CM | POA: Diagnosis not present

## 2021-05-26 DIAGNOSIS — E7800 Pure hypercholesterolemia, unspecified: Secondary | ICD-10-CM

## 2021-05-26 NOTE — Patient Instructions (Signed)
Medication Instructions:   STOP POTASSIUM  STOP TRIAM/HCT  *If you need a refill on your cardiac medications before your next appointment, please call your pharmacy   Testing/Procedures:  Your physician has requested that you have an echocardiogram. Echocardiography is a painless test that uses sound waves to create images of your heart. It provides your doctor with information about the size and shape of your heart and how well your heart's chambers and valves are working. This procedure takes approximately one hour. There are no restrictions for this procedure. [place]   Follow-Up: At [facility], you and your health needs are our priority.  As part of our continuing mission to provide you with exceptional heart care, we have created designated Provider Care Teams.  These Care Teams include your primary Cardiologist (physician) and Advanced Practice Providers (APPs -  Physician Assistants and Nurse Practitioners) who all work together to provide you with the care you need, when you need it.  We recommend signing up for the patient portal called "MyChart".  Sign up information is provided on this After Visit Summary.  MyChart is used to connect with patients for Virtual Visits (Telemedicine).  Patients are able to view lab/test results, encounter notes, upcoming appointments, etc.  Non-urgent messages can be sent to your provider as well.   To learn more about what you can do with MyChart, go to [email or web address].    Your next appointment:   4 month(s)  The format for your next appointment:   In Person  Provider:   Kirk Ruths MD

## 2021-05-28 ENCOUNTER — Telehealth: Payer: Medicare Other

## 2021-05-29 ENCOUNTER — Ambulatory Visit
Admission: RE | Admit: 2021-05-29 | Discharge: 2021-05-29 | Disposition: A | Discharge status: Home / Self Care | Payer: Medicare Other | Source: Ambulatory Visit | Attending: Obstetrics & Gynecology | Admitting: Obstetrics & Gynecology

## 2021-05-29 DIAGNOSIS — Z1231 Encounter for screening mammogram for malignant neoplasm of breast: Secondary | ICD-10-CM

## 2021-05-31 ENCOUNTER — Other Ambulatory Visit: Payer: Medicare Other

## 2021-06-01 ENCOUNTER — Other Ambulatory Visit: Payer: Self-pay | Admitting: Family Medicine

## 2021-06-01 DIAGNOSIS — L810 Postinflammatory hyperpigmentation: Secondary | ICD-10-CM | POA: Diagnosis not present

## 2021-06-01 DIAGNOSIS — D225 Melanocytic nevi of trunk: Secondary | ICD-10-CM | POA: Diagnosis not present

## 2021-06-01 DIAGNOSIS — L608 Other nail disorders: Secondary | ICD-10-CM | POA: Diagnosis not present

## 2021-06-01 DIAGNOSIS — D1801 Hemangioma of skin and subcutaneous tissue: Secondary | ICD-10-CM | POA: Diagnosis not present

## 2021-06-01 DIAGNOSIS — D235 Other benign neoplasm of skin of trunk: Secondary | ICD-10-CM | POA: Diagnosis not present

## 2021-06-01 DIAGNOSIS — L821 Other seborrheic keratosis: Secondary | ICD-10-CM | POA: Diagnosis not present

## 2021-06-01 DIAGNOSIS — R609 Edema, unspecified: Secondary | ICD-10-CM

## 2021-06-01 DIAGNOSIS — M71341 Other bursal cyst, right hand: Secondary | ICD-10-CM | POA: Diagnosis not present

## 2021-06-03 ENCOUNTER — Ambulatory Visit (INDEPENDENT_AMBULATORY_CARE_PROVIDER_SITE_OTHER): Payer: Medicare Other

## 2021-06-03 DIAGNOSIS — R55 Syncope and collapse: Secondary | ICD-10-CM

## 2021-06-03 DIAGNOSIS — I10 Essential (primary) hypertension: Secondary | ICD-10-CM

## 2021-06-03 DIAGNOSIS — E7800 Pure hypercholesterolemia, unspecified: Secondary | ICD-10-CM

## 2021-06-03 MED ORDER — FUROSEMIDE 20 MG PO TABS: 20.0000 mg | ORAL_TABLET | Freq: Every day | ORAL | 3 refills | Status: DC

## 2021-06-03 NOTE — Patient Instructions (Signed)
Visit Information:Thank you for taking time to visit with me today. Please don't hesitate to contact me if I can be of assistance to you before our next scheduled telephone appointment.  Patient Goals/Self-Care Activities: - check blood pressure daily and keep a blood pressure log - keep all doctor appointments - take medications for blood pressure exactly as prescribed - report any additional pre-syncopal episodes or any new or worsening symptoms to your doctor - eat more whole grains, fruits and vegetables, lean meats and healthy fats - continue to weigh and record your weights. Take with you to your next appointment.   Hypertension, Adult Hypertension is another name for high blood pressure. High blood pressure forces your heart to work harder to pump blood. This can cause problems over time. There are two numbers in a blood pressure reading. There is a top number (systolic) over a bottom number (diastolic). It is best to have a blood pressure that is below 120/80. Healthy choices can help lower your blood pressure, or you may need medicine to help lower it. What are the causes? The cause of this condition is not known. Some conditions may be related to high blood pressure. What increases the risk? Smoking. Having type 2 diabetes mellitus, high cholesterol, or both. Not getting enough exercise or physical activity. Being overweight. Having too much fat, sugar, calories, or salt (sodium) in your diet. Drinking too much alcohol. Having long-term (chronic) kidney disease. Having a family history of high blood pressure. Age. Risk increases with age. Race. You may be at higher risk if you are African American. Gender. Men are at higher risk than women before age 68. After age 47, women are at higher risk than men. Having obstructive sleep apnea. Stress. What are the signs or symptoms? High blood pressure may not cause symptoms. Very high blood pressure (hypertensive crisis) may  cause: Headache. Feelings of worry or nervousness (anxiety). Shortness of breath. Nosebleed. A feeling of being sick to your stomach (nausea). Throwing up (vomiting). Changes in how you see. Very bad chest pain. Seizures. How is this treated? This condition is treated by making healthy lifestyle changes, such as: Eating healthy foods. Exercising more. Drinking less alcohol. Your health care provider may prescribe medicine if lifestyle changes are not enough to get your blood pressure under control, and if: Your top number is above 130. Your bottom number is above 80. Your personal target blood pressure may vary. Follow these instructions at home: Eating and drinking  If told, follow the DASH eating plan. To follow this plan: Fill one half of your plate at each meal with fruits and vegetables. Fill one fourth of your plate at each meal with whole grains. Whole grains include whole-wheat pasta, brown rice, and whole-grain bread. Eat or drink low-fat dairy products, such as skim milk or low-fat yogurt. Fill one fourth of your plate at each meal with low-fat (lean) proteins. Low-fat proteins include fish, chicken without skin, eggs, beans, and tofu. Avoid fatty meat, cured and processed meat, or chicken with skin. Avoid pre-made or processed food. Eat less than 1,500 mg of salt each day. Do not drink alcohol if: Your doctor tells you not to drink. You are pregnant, may be pregnant, or are planning to become pregnant. If you drink alcohol: Limit how much you use to: 0-1 drink a day for women. 0-2 drinks a day for men. Be aware of how much alcohol is in your drink. In the [place] one drink equals one 12 oz bottle  of beer (355 mL), one 5 oz glass of wine (148 mL), or one 1 oz glass of hard liquor (44 mL). Lifestyle  Work with your doctor to stay at a healthy weight or to lose weight. Ask your doctor what the best weight is for you. Get at least 30 minutes of exercise most days of the  week. This may include walking, swimming, or biking. Get at least 30 minutes of exercise that strengthens your muscles (resistance exercise) at least 3 days a week. This may include lifting weights or doing Pilates. Do not use any products that contain nicotine or tobacco, such as cigarettes, e-cigarettes, and chewing tobacco. If you need help quitting, ask your doctor. Check your blood pressure at home as told by your doctor. Keep all follow-up visits as told by your doctor. This is important. Medicines Take over-the-counter and prescription medicines only as told by your doctor. Follow directions carefully. Do not skip doses of blood pressure medicine. The medicine does not work as well if you skip doses. Skipping doses also puts you at risk for problems. Ask your doctor about side effects or reactions to medicines that you should watch for. Contact a doctor if you: Think you are having a reaction to the medicine you are taking. Have headaches that keep coming back (recurring). Feel dizzy. Have swelling in your ankles. Have trouble with your vision. Get help right away if you: Get a very bad headache. Start to feel mixed up (confused). Feel weak or numb. Feel faint. Have very bad pain in your: Chest. Belly (abdomen). Throw up more than once. Have trouble breathing. Summary Hypertension is another name for high blood pressure. High blood pressure forces your heart to work harder to pump blood. For most people, a normal blood pressure is less than 120/80. Making healthy choices can help lower blood pressure. If your blood pressure does not get lower with healthy choices, you may need to take medicine. This information is not intended to replace advice given to you by your health care provider. Make sure you discuss any questions you have with your health care provider. Document Revised: 03/14/2018 Document Reviewed: 03/14/2018 Elsevier Patient Education  2022 [place].   PATIENT GOALS/PLAN OF CARE:  Care Plan : RN Care Manager Plan of Care  Updates made by Monica Rued, RN since 06/03/2021 12:00 AM     Problem: Chronic Disease Managment education and/or Care Coordination Needs(HTN,HLD)   Priority: High     Long-Range Goal: Development of plan of care for Chronic Disease Managment (HTN/HLD)   Start Date: 06/03/2021  Expected End Date: 07/03/2021  Priority: High  Note:   Current Barriers: Monica Wallace reports currently being worked up by cardiology for recent episode of presyncope x3. Last cardiology office visit on 05/26/21-Taken off maxide. Monica Wallace reports an increase in blood pressure up to 160/mid 80's-170/96, edema and weight gain of 9.4 pounds. She states she called cardiologist office to notify and received a response to begin furosemide. She reports in the meantime, she states she took her usual dose of Maxide. She reports blood pressure has come down to 140/88. Monica Wallace states that she is getting prescription for furosemide filled and stop Maxzide as recommended by cardiologist. History of HTN, HLD. Knowledge Deficits related to plan of care for management of HTN and HLD   RNCM Clinical Goal(s):  Patient will verbalize understanding of plan for management of HTN and HLD as evidenced by taking medications as prescribed, attending provider visits  as scheduled, notifying provider with questions and concerns, self report and/or chart notation take all medications exactly as prescribed and will call provider for medication related questions as evidenced by self report or as noted in chart    through collaboration with RN Care manager, provider, and care team.   Interventions: 1:1 collaboration with primary care provider regarding development and update of comprehensive plan of care as evidenced by provider attestation and co-signature Inter-disciplinary care team collaboration (see longitudinal plan of care) Evaluation of current treatment  plan related to  self management and patient's adherence to plan as established by provider  Hypertension Interventions: Long Term: goal new Last practice recorded BP readings:  BP Readings from Last 3 Encounters:  05/26/21 114/74  05/19/21 120/74  05/19/21 122/78  Most recent eGFR/CrCl:  Lab Results  Component Value Date   EGFR 78 11/30/2020    No components found for: CRCL  Evaluation of current treatment plan related to hypertension self management and patient's adherence to plan as established by provider; Provided education to patient re: stroke prevention, s/s of heart attack and stroke; Reviewed medications with patient and discussed importance of compliance; Discussed plans with patient for ongoing care management follow up and provided patient with direct contact information for care management team; Reviewed scheduled/upcoming provider appointments including:  Encouraged to contact provider with any questions or concerns  Hyperlipidemia Interventions: Long Term: goal new Medication review performed; medication list updated in electronic medical record.  Provided HLD educational materials;   Patient Goals/Self-Care Activities: - check blood pressure daily and keep a blood pressure log - keep all doctor appointments - take medications for blood pressure exactly as prescribed - report any additional pre-syncopal episodes or any new or worsening symptoms to your doctor - eat more whole grains, fruits and vegetables, lean meats and healthy fats - continue to weigh and record your weights. Take with you to your next appointment.       Consent to CCM Services: Monica Wallace was given information about Chronic Care Management services including:  CCM service includes personalized support from designated clinical staff supervised by her physician, including individualized plan of care and coordination with other care providers 24/7 contact phone numbers for assistance for urgent  and routine care needs. Service will only be billed when office clinical staff spend 20 minutes or more in a month to coordinate care. Only one practitioner may furnish and bill the service in a calendar month. The patient may stop CCM services at any time (effective at the end of the month) by phone call to the office staff. The patient will be responsible for cost sharing (co-pay) of up to 20% of the service fee (after annual deductible is met).  Patient agreed to services and verbal consent obtained.   Patient verbalizes understanding of instructions provided today and agrees to view in [place].   Telephone follow up appointment with care management team member scheduled for: 06/29/21.   If you need to cancel or re-schedule our visit, please call [phone] and our care guide team will be happy to assist you.   Monica Silversmith, RN, MSN, BSN, CCM Care Management Coordinator [facility] [phone]

## 2021-06-03 NOTE — Chronic Care Management (AMB) (Signed)
Chronic Care Management   CCM RN Visit Note  06/03/2021 Name: Monica Wallace MRN: [id number] DOB: 11/09/1950  Subjective: Monica Wallace is a 70 y.o. year old female who is a primary care patient of [name], MD. The care management team was consulted for assistance with disease management and care coordination needs.    Engaged with patient by telephone for initial visit in response to provider referral for case management and/or care coordination services.   Consent to Services:  The patient was given the following information about Chronic Care Management services today, agreed to services, and gave verbal consent: 1. CCM service includes personalized support from designated clinical staff supervised by the primary care provider, including individualized plan of care and coordination with other care providers 2. 24/7 contact phone numbers for assistance for urgent and routine care needs. 3. Service will only be billed when office clinical staff spend 20 minutes or more in a month to coordinate care. 4. Only one practitioner may furnish and bill the service in a calendar month. 5.The patient may stop CCM services at any time (effective at the end of the month) by phone call to the office staff. 6. The patient will be responsible for cost sharing (co-pay) of up to 20% of the service fee (after annual deductible is met). Patient agreed to services and consent obtained.  Patient agreed to services and verbal consent obtained.   Assessment: Review of patient past medical history, allergies, medications, health status, including review of consultants reports, laboratory and other test data, was performed as part of comprehensive evaluation and provision of chronic care management services.   SDOH (Social Determinants of Health) assessments and interventions performed:  SDOH Interventions    Flowsheet Row Most Recent Value  SDOH Interventions   Food Insecurity Interventions Intervention Not  Indicated  Transportation Interventions Intervention Not Indicated        CCM Care Plan  Allergies  Allergen Reactions   Levaquin [Levofloxacin] Shortness Of Breath   Clindamycin/Lincomycin     Pruritus No rash   Gabapentin Itching   Penicillins     Positive on allergy testing 'years ago.' Has never had penicillin. Has tolerated amoxicillin.   Codeine Itching    Outpatient Encounter Medications as of 06/03/2021  Medication Sig Note   acetaminophen (TYLENOL) 325 MG tablet Take 650 mg by mouth every 6 (six) hours as needed.    albuterol (VENTOLIN HFA) 108 (90 Base) MCG/ACT inhaler INHALE 2 PUFFS INTO THE LUNGS EVERY 6 HOURS AS NEEDED FOR WHEEZING OR SHORTNESS OF BREATH    azelastine (ASTELIN) 0.1 % nasal spray Place 1 spray in each nostril twice a day as needed for runny nose/drainage down throat (Patient taking differently: Place 1 spray into both nostrils daily.)    budesonide (PULMICORT) 180 MCG/ACT inhaler Inhale 1-2 puffs into the lungs 2 (two) times daily.    Cholecalciferol (CVS VIT D 5000 HIGH-POTENCY PO) Take 5,000 Units by mouth daily.    estradiol (ESTRACE VAGINAL) 0.1 MG/GM vaginal cream Place 1 Applicatorful vaginally at bedtime.    fluticasone (FLONASE) 50 MCG/ACT nasal spray Place 2 sprays into both nostrils daily.    furosemide (LASIX) 20 MG tablet Take 1 tablet (20 mg total) by mouth daily.    hyoscyamine (LEVSIN SL) 0.125 MG SL tablet Place 1 tablet (0.125 mg total) under the tongue every 4 (four) hours as needed.    loratadine (CLARITIN) 10 MG tablet Take 10 mg by mouth in the morning and at bedtime.  06/03/2021: Reports taking once a day.   montelukast (SINGULAIR) 10 MG tablet TAKE 1 TABLET(10 MG) BY MOUTH DAILY AS NEEDED (Patient taking differently: Take 10 mg by mouth at bedtime.) 06/03/2021: Reports takes every night   omeprazole (PRILOSEC) 40 MG capsule Take 1 capsule by mouth daily.    rosuvastatin (CRESTOR) 5 MG tablet TAKE 1 TABLET BY MOUTH 2 TIMES A  WEEK(TUESDAYS AND SUNDAYS WEEKLY)    SYNTHROID 150 MCG tablet TAKE 1 TABLET(150 MCG) BY MOUTH DAILY BEFORE AND BREAKFAST (Patient taking differently: Take 150 mcg by mouth daily before breakfast.)    Blood Glucose Monitoring Suppl ([place]) w/Device KIT Use to check blood glucose once a day.  DX code: E11.9    fluconazole (DIFLUCAN) 150 MG tablet Take 1 tablet (150 mg total) by mouth once a week. (Patient not taking: Reported on 05/19/2021)    glucose blood (ONETOUCH VERIO) test strip Use to check blood glucose once a day.  DX code: E11.9    Lancets (ONETOUCH DELICA PLUS [id number]) MISC Use to check blood glucose once a day.  DX code: E11.9    Omega-3 1000 MG CAPS Take 1,000 mg by mouth daily. (Patient not taking: Reported on 05/26/2021)    No facility-administered encounter medications on file as of 06/03/2021.    Patient Active Problem List   Diagnosis Date Noted   Cervical cancer screening 03/07/2021   Sinusitis 10/30/2020   Allergic conjunctivitis 04/08/2020   Neck pain 02/06/2020   Acute bronchitis 01/13/2020   Otitis media 09/30/2019   Hip pain, right 09/30/2019   COVID-19 09/30/2019   Educated about COVID-19 virus infection 12/05/2018   High risk heterosexual behavior 11/20/2017   Baker's cyst of knee, right 02/16/2017   Insomnia 07/23/2016   Hypokalemia 07/23/2016   Controlled type 2 diabetes mellitus without complication, without long-term current use of insulin ([place]) 01/24/2016   Preventative health care 01/18/2016   Left-sided low back pain with left-sided sciatica 10/07/2015   Obesity (BMI 30-39.9) 06/05/2015   OSA (obstructive sleep apnea) 02/02/2015   Palpitations 09/19/2014   Plantar fasciitis, bilateral 11/19/2013   Pronation deformity of ankle, acquired 11/19/2013   DJD (degenerative joint disease) 08/26/2013   Fibrocystic breast 08/26/2013   HTN (hypertension) 08/26/2013   GERD (gastroesophageal reflux disease) 08/26/2013   S/P hysterectomy  with oophorectomy 08/26/2013   Hypothyroidism 08/26/2013   Chronic hoarseness 08/26/2013   Colon polyp 08/26/2013   Hiatal hernia 08/26/2013   Irritable bowel syndrome 08/26/2013   Moderate persistent asthma 08/26/2013   Allergic rhinoconjunctivitis 08/26/2013   Hyperlipidemia 08/22/2013   Migraine 08/22/2013   Thyroid cancer ([place]) 08/22/2013    Conditions to be addressed/monitored:HTN, HLD, and pre-syncopal episodes  Care Plan : RN Care Manager Plan of Care  Updates made by Luretha Rued, RN since 06/03/2021 12:00 AM     Problem: Chronic Disease Managment education and/or Care Coordination Needs(HTN,HLD)   Priority: High     Long-Range Goal: Development of plan of care for Chronic Disease Managment (HTN/HLD)   Start Date: 06/03/2021  Expected End Date: 07/03/2021  Priority: High  Note:   Current Barriers: Monica Wallace reports currently being worked up by cardiology for recent episode of presyncope x3. Last cardiology office visit on 05/26/21-Taken off maxide. Monica Wallace reports an increase in blood pressure up to 160/mid 80's-170/96, edema and weight gain of 9.4 pounds. She states she called cardiologist office to notify and received a response to begin furosemide. She reports in the meantime, she states  she took her usual dose of Maxide. She reports blood pressure has come down to 140/88. Monica Wallace states that she is getting prescription for furosemide filled and stop Maxzide as recommended by cardiologist. History of HTN, HLD. Knowledge Deficits related to plan of care for management of HTN and HLD   RNCM Clinical Goal(s):  Patient will verbalize understanding of plan for management of HTN and HLD as evidenced by taking medications as prescribed, attending provider visits as scheduled, notifying provider with questions and concerns, self report and/or chart notation take all medications exactly as prescribed and will call provider for medication related questions as evidenced by  self report or as noted in chart    through collaboration with RN Care manager, provider, and care team.   Interventions: 1:1 collaboration with primary care provider regarding development and update of comprehensive plan of care as evidenced by provider attestation and co-signature Inter-disciplinary care team collaboration (see longitudinal plan of care) Evaluation of current treatment plan related to  self management and patient's adherence to plan as established by provider  Hypertension Interventions: Long Term: goal new Last practice recorded BP readings:  BP Readings from Last 3 Encounters:  05/26/21 114/74  05/19/21 120/74  05/19/21 122/78  Most recent eGFR/CrCl:  Lab Results  Component Value Date   EGFR 78 11/30/2020    No components found for: CRCL  Evaluation of current treatment plan related to hypertension self management and patient's adherence to plan as established by provider; Provided education to patient re: stroke prevention, s/s of heart attack and stroke; Reviewed medications with patient and discussed importance of compliance; Discussed plans with patient for ongoing care management follow up and provided patient with direct contact information for care management team; Reviewed scheduled/upcoming provider appointments including:  Encouraged to contact provider with any questions or concerns  Hyperlipidemia Interventions: Long Term: goal new Medication review performed; medication list updated in electronic medical record.  Provided HLD educational materials;   Patient Goals/Self-Care Activities: - check blood pressure daily and keep a blood pressure log - keep all doctor appointments - take medications for blood pressure exactly as prescribed - report any additional pre-syncopal episodes or any new or worsening symptoms to your doctor - eat more whole grains, fruits and vegetables, lean meats and healthy fats - continue to weigh and record your weights.  Take with you to your next appointment.     Plan:Telephone follow up appointment with care management team member scheduled for:  next month The patient has been provided with contact information for the care management team and has been advised to call with any health related questions or concerns.   Thea Silversmith, RN, MSN, BSN, CCM Care Management Coordinator [facility] [phone]

## 2021-06-03 NOTE — Telephone Encounter (Signed)
Add Lasix 20 mg daily.  Follow blood pressure.  Check potassium and renal function in 1 week.  Kirk Ruths

## 2021-06-06 DIAGNOSIS — K219 Gastro-esophageal reflux disease without esophagitis: Secondary | ICD-10-CM | POA: Diagnosis not present

## 2021-06-06 DIAGNOSIS — J45909 Unspecified asthma, uncomplicated: Secondary | ICD-10-CM | POA: Diagnosis not present

## 2021-06-06 DIAGNOSIS — J069 Acute upper respiratory infection, unspecified: Secondary | ICD-10-CM | POA: Diagnosis not present

## 2021-06-06 DIAGNOSIS — I10 Essential (primary) hypertension: Secondary | ICD-10-CM | POA: Diagnosis not present

## 2021-06-08 ENCOUNTER — Ambulatory Visit (INDEPENDENT_AMBULATORY_CARE_PROVIDER_SITE_OTHER): Payer: Medicare Other | Admitting: Internal Medicine

## 2021-06-08 ENCOUNTER — Other Ambulatory Visit: Payer: Self-pay

## 2021-06-08 VITALS — BP 132/82 | HR 67 | Ht 62.0 in | Wt 205.0 lb

## 2021-06-08 DIAGNOSIS — C73 Malignant neoplasm of thyroid gland: Secondary | ICD-10-CM | POA: Diagnosis not present

## 2021-06-08 DIAGNOSIS — E890 Postprocedural hypothyroidism: Secondary | ICD-10-CM | POA: Diagnosis not present

## 2021-06-08 NOTE — Progress Notes (Signed)
Name: Monica Wallace  MRN/ DOB: [id number], [birth year]    Age/ Sex: 70 y.o., female     PCP: [name], MD   Reason for Endocrinology Evaluation: Postoperative hypothyroidism     Initial Endocrinology Clinic Visit: 12/08/2020    PATIENT IDENTIFIER: Monica Wallace is a 70 y.o., female with a past medical history of T2DM, Dyslipidemia and Hypothyroidism. She has followed with [facility] since 12/08/2020 for consultative assistance with management of her postoperative hypothyroidism .    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    An ultrasound in 2002 showed MNG with a very large right thyoid nodule 3.6x2.3x1.7 cm    She is S/P total thyroidectomy in 2002 , for PTC ,encapsulated tumor, low grade     12/18/2020 S/P RAI remnant ablation 100 mCi of I-131.      12/28/2000: Posttreatment whole-body scan: Residual functioning thyroid tissue in the thyroid bed in the midline and extending towards the left. No distant metastases identified. 08/02/2006: Thyrogen stimulated I-123 whole body scan: Normal physiologic uptake in nares, salivary glands, gastrointestinal tract and urinary tract. No abnormal uptake indicating any functioning thyroid tissue.        Mother with thyroid disease , S/P total thyroidectomy due to benign nodule.        SUBJECTIVE:    Today (06/08/2021):  Monica Wallace is here for a follow up on hypothyroidism   Denies local neck swelling  Denies loose stools or diarrhea  Has occasional tremors  Occasional palpitations   Has near syncope episodes, is being evaluated by cardiology , most of these episodes have been positional   HISTORY:  Past Medical History:  Past Medical History:  Diagnosis Date   Arthritis    Asthma    Baker's cyst of knee, right 02/16/2017   Cancer ([place]) 2002   thyroid (2002)   Cystic breast     Diabetes ([place])    GERD (gastroesophageal reflux disease)    Hepatic cyst 02/02/2015   Hyperlipidemia    Hypertension    Insomnia 07/23/2016   Menopause    Migraine    Palpitations    Preventative health care 01/18/2016   Sleep apnea 02/02/2015   Thyroid disease    Past Surgical History:  Past Surgical History:  Procedure Laterality Date   ABDOMINAL HYSTERECTOMY     took right ovary and uterus   APPENDECTOMY     CARPAL TUNNEL RELEASE Right    CESAREAN SECTION     CHOLECYSTECTOMY     EYE SURGERY Bilateral 2017   cataracts   KNEE ARTHROPLASTY     SHOULDER ARTHROSCOPY W/ ROTATOR CUFF REPAIR Left    THYROIDECTOMY  2002   Social History:  reports that she has never smoked. She has never used smokeless tobacco. She reports current alcohol use. She reports that she does not use drugs. Family History:  Family History  Problem Relation Age of Onset   Arthritis Mother    Transient ischemic attack Mother    Hypertension Mother    Hyperlipidemia Mother    Diabetes Father    Heart disease Father    Arthritis Father    Kidney disease Father    Hypertension Father    Hyperlipidemia Father    Cancer Sister 48       pancreatic   Fibromyalgia Sister    Allergic rhinitis Sister  Asthma Maternal Grandfather        smoker   Depression Maternal Grandfather    Dementia Paternal Grandmother    Diabetes Paternal Grandfather    Hypertension Brother    Allergic rhinitis Brother    Scoliosis Daughter        had rods placed and removed   Arthritis Sister    Gout Sister    GER disease Sister    Cancer Sister        breast cancer   Breast cancer Sister 55     HOME MEDICATIONS: Allergies as of 06/08/2021       Reactions   Levaquin [levofloxacin] Shortness Of Breath   Clindamycin/lincomycin    Pruritus No rash   Gabapentin Itching   Penicillins    Positive on allergy testing 'years ago.' Has never had penicillin. Has tolerated amoxicillin.   Codeine Itching         Medication List        Accurate as of June 08, 2021  4:12 PM. If you have any questions, ask your nurse or doctor.          STOP taking these medications    fluconazole 150 MG tablet Commonly known as: DIFLUCAN Stopped by: Dorita Sciara, MD       TAKE these medications    acetaminophen 325 MG tablet Commonly known as: TYLENOL Take 650 mg by mouth every 6 (six) hours as needed.   albuterol 108 (90 Base) MCG/ACT inhaler Commonly known as: VENTOLIN HFA INHALE 2 PUFFS INTO THE LUNGS EVERY 6 HOURS AS NEEDED FOR WHEEZING OR SHORTNESS OF BREATH   Azelastine HCl 137 MCG/SPRAY Soln Place 1 spray in each nostril twice a day as needed for runny nose/drainage down throat What changed:  how much to take how to take this when to take this additional instructions   CVS VIT D 5000 HIGH-POTENCY PO Take 5,000 Units by mouth daily.   estradiol 0.1 MG/GM vaginal cream Commonly known as: ESTRACE VAGINAL Place 1 Applicatorful vaginally at bedtime.   fluticasone 50 MCG/ACT nasal spray Commonly known as: FLONASE Place 2 sprays into both nostrils daily.   furosemide 20 MG tablet Commonly known as: LASIX Take 1 tablet (20 mg total) by mouth daily.   hyoscyamine 0.125 MG SL tablet Commonly known as: LEVSIN SL Place 1 tablet (0.125 mg total) under the tongue every 4 (four) hours as needed.   loratadine 10 MG tablet Commonly known as: CLARITIN Take 10 mg by mouth in the morning and at bedtime.   montelukast 10 MG tablet Commonly known as: SINGULAIR TAKE 1 TABLET(10 MG) BY MOUTH DAILY AS NEEDED What changed:  how much to take how to take this when to take this additional instructions   Omega-3 1000 MG Caps Take 1,000 mg by mouth daily.   omeprazole 40 MG capsule Commonly known as: PRILOSEC Take 1 capsule by mouth daily.   OneTouch Delica Plus [id number] Misc Use to check blood glucose once a day.  DX code: E11.9   OneTouch Verio Flex System w/Device  Kit Use to check blood glucose once a day.  DX code: E11.9   OneTouch Verio test strip Generic drug: glucose blood Use to check blood glucose once a day.  DX code: E11.9   Pulmicort Flexhaler 180 MCG/ACT inhaler Generic drug: budesonide Inhale 1-2 puffs into the lungs 2 (two) times daily.   rosuvastatin 5 MG tablet Commonly known as: CRESTOR TAKE 1 TABLET BY MOUTH 2 TIMES A WEEK(TUESDAYS  AND SUNDAYS WEEKLY)   Synthroid 150 MCG tablet Generic drug: levothyroxine TAKE 1 TABLET(150 MCG) BY MOUTH DAILY BEFORE AND BREAKFAST What changed: See the new instructions.          OBJECTIVE:   PHYSICAL EXAM: VS: BP 132/82 (BP Location: Left Arm, Patient Position: Sitting, Cuff Size: Normal)   Pulse 67   Ht _0  (1.575 m)   Wt 205 lb (93 kg)   SpO2 97%   BMI 37.49 kg/m    EXAM: General: Pt appears well and is in NAD  Neck: General: Supple without adenopathy. Thyroid: Thyroid size normal.  No goiter or nodules appreciated. No thyroid bruit.  Lungs: Clear with good BS bilat with no rales, rhonchi, or wheezes  Heart: Auscultation: RRR.  Abdomen: Normoactive bowel sounds, soft, nontender, without masses or organomegaly palpable  Extremities:  BL LE: No pretibial edema normal ROM and strength.  Mental Status: Judgment, insight: Intact Orientation: Oriented to time, place, and person Memory: Intact for recent and remote events Mood and affect: No depression, anxiety, or agitation     DATA REVIEWED:   Latest Reference Range & Units 06/08/21 16:29  TSH 0.35 - 5.50 uIU/mL 0.69  Thyroglobulin ng/mL <0.1 (L)  Thyroglobulin Ab < or = 1 IU/mL <1     ASSESSMENT / PLAN / RECOMMENDATIONS:   Postsurgical Hypothyroidism :  Patient is clinically and biochemically euthyroid - TSH at goal, no changes    -Medications : Synthroid 150 MCG, half a tablet on Saturdays , 1 tablet the rest of the week  2. Hx pf PTC:   - TSH goal 0.5-2.0 uIU/mL  - She has excellent response to  treatment - She is at low risk of recurrence - Tg and Tg antibody are undetectable - Will proceed with thyroid bed ultrasound as a baseline    3. Near Syncope :   - She is being evaluated by cardiology, we encouraged hydration and changing positions cautiously  - TSh is normal     F/U in 6 months   Signed electronically by: Mack Guise, MD  [facility] End[facility]  [facility] [place], [place] [address] Phone: [phone] FAX: [phone]      CC: [name], [address] [place] [place] [address] Phone: [phone]  Fax: [phone]   Return to Endocrinology clinic as below: Future Appointments  Date Time Provider [place]  06/21/2021  3:00 PM [facility]ECHO 1 [facility]  06/22/2021  5:00 PM Percival Spanish, PT [facility]  06/28/2021  5:00 PM Percival Spanish, PT [facility]  06/29/2021  3:30 PM [facility] SW-[facility]M CARE [facility]  07/05/2021  5:00 PM Artist Pais, PTA [facility]  07/07/2021  5:00 PM Artist Pais, PTA [facility]  07/13/2021  5:00 PM Artist Pais, PTA [facility]  07/15/2021  5:00 PM Percival Spanish, PT [facility]  09/02/2021  3:20 PM [name], MD [facility]  10/06/2021  9:00 AM Lelon Perla, MD [facility] None

## 2021-06-08 NOTE — Patient Instructions (Signed)

## 2021-06-09 LAB — THYROGLOBULIN LEVEL: Thyroglobulin: <0.1 ng/mL — ABNORMAL LOW

## 2021-06-09 LAB — THYROGLOBULIN ANTIBODY: Thyroglobulin Ab: <1 IU/mL (ref <=1)

## 2021-06-09 LAB — TSH: TSH: 0.69 u[IU]/mL (ref 0.35–5.50)

## 2021-06-15 ENCOUNTER — Other Ambulatory Visit: Payer: Self-pay | Admitting: Family Medicine

## 2021-06-15 ENCOUNTER — Telehealth: Payer: Self-pay | Admitting: Family Medicine

## 2021-06-15 ENCOUNTER — Ambulatory Visit: Payer: Medicare Other | Admitting: Physical Therapy

## 2021-06-15 DIAGNOSIS — G8929 Other chronic pain: Secondary | ICD-10-CM

## 2021-06-15 DIAGNOSIS — M542 Cervicalgia: Secondary | ICD-10-CM

## 2021-06-15 NOTE — Progress Notes (Unsigned)
Amb ref

## 2021-06-15 NOTE — Telephone Encounter (Signed)
Pt is requesting a new referral to continue physical therapy. Please advise.

## 2021-06-16 DIAGNOSIS — I10 Essential (primary) hypertension: Secondary | ICD-10-CM

## 2021-06-16 DIAGNOSIS — E7800 Pure hypercholesterolemia, unspecified: Secondary | ICD-10-CM | POA: Diagnosis not present

## 2021-06-16 NOTE — Telephone Encounter (Signed)
Pt is aware and will check with insurance to see if it covered

## 2021-06-17 ENCOUNTER — Ambulatory Visit: Payer: Medicare Other | Admitting: Cardiology

## 2021-06-17 ENCOUNTER — Telehealth: Payer: Self-pay | Admitting: *Deleted

## 2021-06-17 DIAGNOSIS — R609 Edema, unspecified: Secondary | ICD-10-CM | POA: Diagnosis not present

## 2021-06-17 NOTE — Chronic Care Management (AMB) (Signed)
  Care Management   Note  06/17/2021 Name: Monica Wallace MRN: [id number] DOB: 11/28/1950  [name] is a 70 y.o. year old female who is a primary care patient of [name], MD and is actively engaged with the care management team. I reached out to Myrtice Lauth by phone today to assist with re-scheduling a follow up visit with the RN Case Manager  Follow up plan: Unsuccessful telephone outreach attempt made. A HIPAA compliant phone message was left for the patient providing contact information and requesting a return call.   Julian Hy, [facility] Management  Direct Dial: [phone]

## 2021-06-18 ENCOUNTER — Emergency Department (HOSPITAL_COMMUNITY)
Admission: EM | Admit: 2021-06-18 | Discharge: 2021-06-18 | Disposition: A | Discharge status: Home / Self Care | Payer: Medicare Other | Attending: Emergency Medicine | Admitting: Emergency Medicine

## 2021-06-18 ENCOUNTER — Emergency Department (HOSPITAL_COMMUNITY): Payer: Medicare Other

## 2021-06-18 ENCOUNTER — Encounter (HOSPITAL_COMMUNITY): Payer: Self-pay | Admitting: Emergency Medicine

## 2021-06-18 DIAGNOSIS — R29898 Other symptoms and signs involving the musculoskeletal system: Secondary | ICD-10-CM

## 2021-06-18 DIAGNOSIS — I10 Essential (primary) hypertension: Secondary | ICD-10-CM | POA: Diagnosis not present

## 2021-06-18 DIAGNOSIS — E119 Type 2 diabetes mellitus without complications: Secondary | ICD-10-CM | POA: Insufficient documentation

## 2021-06-18 DIAGNOSIS — R29818 Other symptoms and signs involving the nervous system: Secondary | ICD-10-CM | POA: Diagnosis not present

## 2021-06-18 DIAGNOSIS — Z96659 Presence of unspecified artificial knee joint: Secondary | ICD-10-CM | POA: Insufficient documentation

## 2021-06-18 DIAGNOSIS — Z85850 Personal history of malignant neoplasm of thyroid: Secondary | ICD-10-CM | POA: Diagnosis not present

## 2021-06-18 DIAGNOSIS — G4733 Obstructive sleep apnea (adult) (pediatric): Secondary | ICD-10-CM | POA: Insufficient documentation

## 2021-06-18 DIAGNOSIS — Z8616 Personal history of COVID-19: Secondary | ICD-10-CM | POA: Insufficient documentation

## 2021-06-18 DIAGNOSIS — Z6830 Body mass index (BMI) 30.0-30.9, adult: Secondary | ICD-10-CM | POA: Insufficient documentation

## 2021-06-18 DIAGNOSIS — Y900 Blood alcohol level of less than 20 mg/100 ml: Secondary | ICD-10-CM | POA: Diagnosis not present

## 2021-06-18 DIAGNOSIS — M6281 Muscle weakness (generalized): Secondary | ICD-10-CM | POA: Insufficient documentation

## 2021-06-18 DIAGNOSIS — R479 Unspecified speech disturbances: Secondary | ICD-10-CM | POA: Insufficient documentation

## 2021-06-18 DIAGNOSIS — Z79899 Other long term (current) drug therapy: Secondary | ICD-10-CM | POA: Insufficient documentation

## 2021-06-18 DIAGNOSIS — Z20822 Contact with and (suspected) exposure to covid-19: Secondary | ICD-10-CM | POA: Insufficient documentation

## 2021-06-18 DIAGNOSIS — Z9089 Acquired absence of other organs: Secondary | ICD-10-CM | POA: Insufficient documentation

## 2021-06-18 DIAGNOSIS — R531 Weakness: Secondary | ICD-10-CM | POA: Diagnosis not present

## 2021-06-18 DIAGNOSIS — E669 Obesity, unspecified: Secondary | ICD-10-CM | POA: Diagnosis not present

## 2021-06-18 DIAGNOSIS — R4781 Slurred speech: Secondary | ICD-10-CM | POA: Diagnosis not present

## 2021-06-18 DIAGNOSIS — R410 Disorientation, unspecified: Secondary | ICD-10-CM | POA: Diagnosis not present

## 2021-06-18 LAB — RAPID URINE DRUG SCREEN, HOSP PERFORMED
Amphetamines: NOT DETECTED
Barbiturates: NOT DETECTED
Benzodiazepines: NOT DETECTED
Cocaine: NOT DETECTED
Opiates: NOT DETECTED
Tetrahydrocannabinol: NOT DETECTED

## 2021-06-18 LAB — COMPREHENSIVE METABOLIC PANEL
ALT: 25 U/L (ref 0–44)
AST: 23 U/L (ref 15–41)
Albumin: 3.4 g/dL — ABNORMAL LOW (ref 3.5–5.0)
Alkaline Phosphatase: 71 U/L (ref 38–126)
Anion gap: 6 (ref 5–15)
BUN: 12 mg/dL (ref 8–23)
CO2: 30 mmol/L (ref 22–32)
Calcium: 9.5 mg/dL (ref 8.9–10.3)
Chloride: 103 mmol/L (ref 98–111)
Creatinine, Ser: 0.81 mg/dL (ref 0.44–1.00)
GFR, Estimated: >60 mL/min (ref >60)
Glucose, Bld: 107 mg/dL — ABNORMAL HIGH (ref 70–99)
Potassium: 3.8 mmol/L (ref 3.5–5.1)
Sodium: 139 mmol/L (ref 135–145)
Total Bilirubin: 0.4 mg/dL (ref 0.3–1.2)
Total Protein: 6.7 g/dL (ref 6.5–8.1)

## 2021-06-18 LAB — I-STAT CHEM 8, ED
BUN: 13 mg/dL (ref 8–23)
Calcium, Ion: 1.14 mmol/L — ABNORMAL LOW (ref 1.15–1.40)
Chloride: 102 mmol/L (ref 98–111)
Creatinine, Ser: 0.7 mg/dL (ref 0.44–1.00)
Glucose, Bld: 109 mg/dL — ABNORMAL HIGH (ref 70–99)
HCT: 41 % (ref 36.0–46.0)
Hemoglobin: 13.9 g/dL (ref 12.0–15.0)
Potassium: 3.7 mmol/L (ref 3.5–5.1)
Sodium: 140 mmol/L (ref 135–145)
TCO2: 29 mmol/L (ref 22–32)

## 2021-06-18 LAB — BASIC METABOLIC PANEL
BUN/Creatinine Ratio: 19 (ref 12–28)
BUN: 15 mg/dL (ref 8–27)
CO2: 29 mmol/L (ref 20–29)
Calcium: 9.4 mg/dL (ref 8.7–10.3)
Chloride: 98 mmol/L (ref 96–106)
Creatinine, Ser: 0.78 mg/dL (ref 0.57–1.00)
Glucose: 103 mg/dL — ABNORMAL HIGH (ref 70–99)
Potassium: 3.9 mmol/L (ref 3.5–5.2)
Sodium: 139 mmol/L (ref 134–144)
eGFR: 81 mL/min/{1.73_m2} (ref >59)

## 2021-06-18 LAB — CBC
HCT: 40.7 % (ref 36.0–46.0)
Hemoglobin: 13.3 g/dL (ref 12.0–15.0)
MCH: 29.7 pg (ref 26.0–34.0)
MCHC: 32.7 g/dL (ref 30.0–36.0)
MCV: 90.8 fL (ref 80.0–100.0)
Platelets: 315 10*3/uL (ref 150–400)
RBC: 4.48 MIL/uL (ref 3.87–5.11)
RDW: 13.4 % (ref 11.5–15.5)
WBC: 9.7 10*3/uL (ref 4.0–10.5)
nRBC: 0 % (ref 0.0–0.2)

## 2021-06-18 LAB — URINALYSIS, ROUTINE W REFLEX MICROSCOPIC
Bilirubin Urine: NEGATIVE
Glucose, UA: NEGATIVE mg/dL
Hgb urine dipstick: NEGATIVE
Ketones, ur: NEGATIVE mg/dL
Leukocytes,Ua: NEGATIVE
Nitrite: NEGATIVE
Protein, ur: NEGATIVE mg/dL
Specific Gravity, Urine: 1.015 (ref 1.005–1.030)
pH: 8 (ref 5.0–8.0)

## 2021-06-18 LAB — DIFFERENTIAL
Abs Immature Granulocytes: 0.04 10*3/uL (ref 0.00–0.07)
Basophils Absolute: 0.1 10*3/uL (ref 0.0–0.1)
Basophils Relative: 1 %
Eosinophils Absolute: 0.2 10*3/uL (ref 0.0–0.5)
Eosinophils Relative: 2 %
Immature Granulocytes: 0 %
Lymphocytes Relative: 30 %
Lymphs Abs: 2.9 10*3/uL (ref 0.7–4.0)
Monocytes Absolute: 0.7 10*3/uL (ref 0.1–1.0)
Monocytes Relative: 7 %
Neutro Abs: 5.8 10*3/uL (ref 1.7–7.7)
Neutrophils Relative %: 60 %

## 2021-06-18 LAB — RESP PANEL BY RT-PCR (FLU A&B, COVID) ARPGX2
Influenza A by PCR: NEGATIVE
Influenza B by PCR: NEGATIVE
SARS Coronavirus 2 by RT PCR: NEGATIVE

## 2021-06-18 LAB — APTT: aPTT: 31 seconds (ref 24–36)

## 2021-06-18 LAB — PROTIME-INR
INR: 1 (ref 0.8–1.2)
Prothrombin Time: 13.1 seconds (ref 11.4–15.2)

## 2021-06-18 LAB — ETHANOL: Alcohol, Ethyl (B): <10 mg/dL (ref <10)

## 2021-06-18 MED ORDER — LORAZEPAM 2 MG/ML IJ SOLN
1.0000 mg | Freq: Once | INTRAMUSCULAR | Status: AC
  Administered 2021-06-18: 1 mg via INTRAVENOUS
  Filled 2021-06-18: qty 1

## 2021-06-18 NOTE — ED Triage Notes (Addendum)
Patient here for evaluation of sudden onset of right arm heaviness, garbled speech, and disorientation that started at approximately 0525 this morning and resolved spontaneously. No arm drift, speech is clear, no sensation changes, no facial droop. Family with patient reports this is the fourth of these episodes since august 2022. Patient reports she has been trying different medications with her cardiologist to manage her hypertension. Patient alert, oriented, and in no apparent distress at this time.

## 2021-06-18 NOTE — ED Provider Notes (Signed)
Emergency Medicine Provider Triage Evaluation Note  Monica Wallace , a 70 y.o. female  was evaluated in triage.  Pt complains of right arm heaviness, change in speech, leg heaviness, moreso on the right. Onset around 4:45am today upon waking, noted by her daughter around 5:25am, last known well last night prior to bed. Reports this to be the 4th episode since August with same symptoms, more pronounced on right today.  Review of Systems  Positive: Change in speech, weakness Negative: CP  Physical Exam  BP (!) 178/89 (BP Location: Right Arm)   Pulse 70   Temp 98.4 F (36.9 C)   Resp 16   SpO2 99%  Gen:   Awake, no distress   Resp:  Normal effort  MSK:   Moves extremities without difficulty  Other:  No arm drift, sensation intact, face symmetrical.   Medical Decision Making  Medically screening exam initiated at 8:12 AM.  Appropriate orders placed.  Monica Wallace was informed that the remainder of the evaluation will be completed by another provider, this initial triage assessment does not replace that evaluation, and the importance of remaining in the ED until their evaluation is complete.     Tacy Learn, PA-C 06/18/21 [place], [place], DO 06/18/21 [phone]

## 2021-06-18 NOTE — Discharge Instructions (Signed)
At this time the cause of your symptoms is unclear.  MRIs remain normal making stroke very unlikely.  Some headache patterns can cause strokelike symptoms with weakness numbness that mimic a stroke.  At this time you are advised to follow-up with [facility] to see one of their headache specialist to determine if your symptoms might be due to a more rare headache syndrome. Continue your planned follow-up with cardiology for your echocardiogram and other follow-ups with your family doctor.  Return to the emergency department for new or concerning symptoms.

## 2021-06-18 NOTE — ED Provider Notes (Signed)
Consult neurology. TIA/CVA sx. NIH 0 but perceives RUE feels heavy. MRI repeat. No H/A but distant h/o migraine without neuro sx. Physical Exam  BP (!) 168/79   Pulse 62   Temp 98.5 F (36.9 C) (Oral)   Resp 14   SpO2 99%   Physical Exam  ED Course/Procedures     Procedures  MDM   I reviewed the patient's history.  She has had multiple similar episodes since October.  Symptoms have been resolving between episodes.  Patient is undergoing cardiology evaluation and has upcoming echo scheduled.  Consult: Reviewed with neurology Dr. Rory Percy.  Reviewed patient's history of present illness and evaluation.  At this time no further inpatient diagnostic evaluation indicated.  Recommends follow-up headache specialist in [facility].  Patient is alert with clear mental status.  Motor strength testing intact.  Sensation intact.  Cognitive function intact.  MRIs results reviewed.  This time we reviewed plan for follow-up with headache clinic continued outpatient work-up with cardiology as scheduled      Charlesetta Shanks, MD 06/18/21 2121

## 2021-06-18 NOTE — ED Provider Notes (Signed)
[facility] EMERGENCY DEPARTMENT Provider Note   CSN: [id number] Arrival date & time: 06/18/21  0707     History Chief Complaint  Patient presents with   Weakness    Monica Wallace is a 70 y.o. female.  HPI  70 year old female with past medical history of HTN, HLD migraines, thyroid cancer presents the emergency department with concern for right arm weakness and an episode of change speech.  Patient went to bed last night at 9 PM in her usual state of health.  She woke up this morning around 5 AM for work and states that her right upper extremity felt heavy and hard to move.  She got up to use the restroom and "did not feel like herself" so she went to her daughter's room.  The daughter states that she had a absent stare on her eyes and change to speech.  This lasted for about 20 minutes when EMS was called.  Patient states that the speech change has resolved but she still feels heavy in the right upper extremity.  There was no noted headache, vision changes, facial droop, numbness.  Patient has had 3 episodes like this in the past.  They have all involve the lower extremities, never prominent speech changes.  She is had MRI imaging that has been negative for stroke/TIA.  Not following with neurology as an outpatient.  Past Medical History:  Diagnosis Date   Arthritis    Asthma    Baker's cyst of knee, right 02/16/2017   Cancer ([place]) 2002   thyroid (2002)   Cystic breast    Diabetes ([place])    GERD (gastroesophageal reflux disease)    Hepatic cyst 02/02/2015   Hyperlipidemia    Hypertension    Insomnia 07/23/2016   Menopause    Migraine    Palpitations    Preventative health care 01/18/2016   Sleep apnea 02/02/2015   Thyroid disease     Patient Active Problem List   Diagnosis Date Noted   Cervical cancer screening 03/07/2021   Sinusitis 10/30/2020   Allergic conjunctivitis 04/08/2020   Neck pain 02/06/2020   Acute bronchitis 01/13/2020   Otitis media  09/30/2019   Hip pain, right 09/30/2019   COVID-19 09/30/2019   Educated about COVID-19 virus infection 12/05/2018   High risk heterosexual behavior 11/20/2017   Baker's cyst of knee, right 02/16/2017   Insomnia 07/23/2016   Hypokalemia 07/23/2016   Controlled type 2 diabetes mellitus without complication, without long-term current use of insulin ([place]) 01/24/2016   Preventative health care 01/18/2016   Left-sided low back pain with left-sided sciatica 10/07/2015   Obesity (BMI 30-39.9) 06/05/2015   OSA (obstructive sleep apnea) 02/02/2015   Palpitations 09/19/2014   Plantar fasciitis, bilateral 11/19/2013   Pronation deformity of ankle, acquired 11/19/2013   DJD (degenerative joint disease) 08/26/2013   Fibrocystic breast 08/26/2013   HTN (hypertension) 08/26/2013   GERD (gastroesophageal reflux disease) 08/26/2013   S/P hysterectomy with oophorectomy 08/26/2013   Hypothyroidism 08/26/2013   Chronic hoarseness 08/26/2013   Colon polyp 08/26/2013   Hiatal hernia 08/26/2013   Irritable bowel syndrome 08/26/2013   Moderate persistent asthma 08/26/2013   Allergic rhinoconjunctivitis 08/26/2013   Hyperlipidemia 08/22/2013   Migraine 08/22/2013   Thyroid cancer ([place]) 08/22/2013    Past Surgical History:  Procedure Laterality Date   ABDOMINAL HYSTERECTOMY     took right ovary and uterus   APPENDECTOMY     CARPAL TUNNEL RELEASE Right    CESAREAN SECTION  CHOLECYSTECTOMY     EYE SURGERY Bilateral 2017   cataracts   KNEE ARTHROPLASTY     SHOULDER ARTHROSCOPY W/ ROTATOR CUFF REPAIR Left    THYROIDECTOMY  2002     OB History     Gravida  2   Para  2   Term  2   Preterm      AB      Living  2      SAB      IAB      Ectopic      Multiple      Live Births  2           Family History  Problem Relation Age of Onset   Arthritis Mother    Transient ischemic attack Mother    Hypertension Mother    Hyperlipidemia Mother    Diabetes Father    Heart  disease Father    Arthritis Father    Kidney disease Father    Hypertension Father    Hyperlipidemia Father    Cancer Sister 61       pancreatic   Fibromyalgia Sister    Allergic rhinitis Sister    Asthma Maternal Grandfather        smoker   Depression Maternal Grandfather    Dementia Paternal Grandmother    Diabetes Paternal Grandfather    Hypertension Brother    Allergic rhinitis Brother    Scoliosis Daughter        had rods placed and removed   Arthritis Sister    Gout Sister    GER disease Sister    Cancer Sister        breast cancer   Breast cancer Sister 47    Social History   Tobacco Use   Smoking status: Never   Smokeless tobacco: Never  Vaping Use   Vaping Use: Never used  Substance Use Topics   Alcohol use: Yes    Alcohol/week: 0.0 standard drinks    Comment: very rare once or twice a year   Drug use: No    Home Medications Prior to Admission medications   Medication Sig Start Date End Date Taking? Authorizing Provider  acetaminophen (TYLENOL) 325 MG tablet Take 650 mg by mouth every 6 (six) hours as needed.    [provider]  albuterol (VENTOLIN HFA) 108 (90 Base) MCG/ACT inhaler INHALE 2 PUFFS INTO THE LUNGS EVERY 6 HOURS AS NEEDED FOR WHEEZING OR SHORTNESS OF BREATH 10/01/19   [name], MD  azelastine (ASTELIN) 0.1 % nasal spray Place 1 spray in each nostril twice a day as needed for runny nose/drainage down throat Patient taking differently: Place 1 spray into both nostrils daily. 11/19/20   Althea Charon, FNP  Blood Glucose Monitoring Suppl ([place]) w/Device KIT Use to check blood glucose once a day.  DX code: E11.9 02/06/20   [name], MD  budesonide (PULMICORT) 180 MCG/ACT inhaler Inhale 1-2 puffs into the lungs 2 (two) times daily. 11/19/20   Althea Charon, FNP  Cholecalciferol ([employer] VIT D 5000 HIGH-POTENCY PO) Take 5,000 Units by mouth daily.    [provider]  estradiol (ESTRACE VAGINAL) 0.1 MG/GM  vaginal cream Place 1 Applicatorful vaginally at bedtime. 04/28/21   Lavonia Drafts, MD  fluticasone (FLONASE) 50 MCG/ACT nasal spray Place 2 sprays into both nostrils daily. 10/29/20   [name], MD  furosemide (LASIX) 20 MG tablet Take 1 tablet (20 mg total) by mouth daily. 06/03/21  09/01/21  Lelon Perla, MD  glucose blood (ONETOUCH VERIO) test strip Use to check blood glucose once a day.  DX code: E11.9 02/06/20   [name], MD  hyoscyamine (LEVSIN SL) 0.125 MG SL tablet Place 1 tablet (0.125 mg total) under the tongue every 4 (four) hours as needed. 03/02/21   [name], MD  Lancets (ONETOUCH DELICA PLUS LDJTTS17B) MISC Use to check blood glucose once a day.  DX code: E11.9 02/06/20   [name], MD  loratadine (CLARITIN) 10 MG tablet Take 10 mg by mouth in the morning and at bedtime.     [provider]  montelukast (SINGULAIR) 10 MG tablet TAKE 1 TABLET(10 MG) BY MOUTH DAILY AS NEEDED Patient taking differently: Take 10 mg by mouth at bedtime. 01/26/21   Althea Charon, FNP  Omega-3 1000 MG CAPS Take 1,000 mg by mouth daily.    [provider]  omeprazole (PRILOSEC) 40 MG capsule Take 1 capsule by mouth daily. 10/28/20   [provider]  rosuvastatin (CRESTOR) 5 MG tablet TAKE 1 TABLET BY MOUTH 2 TIMES A WEEK(TUESDAYS AND SUNDAYS WEEKLY) 06/01/21   [name], MD  SYNTHROID 150 MCG tablet TAKE 1 TABLET(150 MCG) BY MOUTH DAILY BEFORE AND BREAKFAST Patient taking differently: Take 150 mcg by mouth daily before breakfast. 04/02/21   [name], MD    Allergies    Levaquin [levofloxacin], Clindamycin/lincomycin, Gabapentin, Penicillins, and Codeine  Review of Systems   Review of Systems  Constitutional:  Positive for fatigue. Negative for chills and fever.  HENT:  Negative for congestion.   Eyes:  Negative for visual disturbance.  Respiratory:  Negative for shortness of breath.   Cardiovascular:  Negative for chest pain.   Gastrointestinal:  Negative for abdominal pain, diarrhea and vomiting.  Genitourinary:  Negative for dysuria.  Skin:  Negative for rash.  Neurological:  Positive for speech difficulty and weakness. Negative for dizziness, facial asymmetry, light-headedness, numbness and headaches.   Physical Exam Updated Vital Signs BP (!) 168/79   Pulse 62   Temp 98.5 F (36.9 C) (Oral)   Resp 14   SpO2 99%   Physical Exam Vitals and nursing note reviewed.  Constitutional:      General: She is not in acute distress.    Appearance: Normal appearance.  HENT:     Head: Normocephalic.     Mouth/Throat:     Mouth: Mucous membranes are moist.  Cardiovascular:     Rate and Rhythm: Normal rate.  Pulmonary:     Effort: Pulmonary effort is normal. No respiratory distress.  Abdominal:     Palpations: Abdomen is soft.     Tenderness: There is no abdominal tenderness.  Skin:    General: Skin is warm.  Neurological:     General: No focal deficit present.     Mental Status: She is alert and oriented to person, place, and time. Mental status is at baseline.     Cranial Nerves: No cranial nerve deficit.  Psychiatric:        Mood and Affect: Mood normal.    ED Results / Procedures / Treatments   Labs (all labs ordered are listed, but only abnormal results are displayed) Labs Reviewed  COMPREHENSIVE METABOLIC PANEL - Abnormal; Notable for the following components:      Result Value   Glucose, Bld 107 (*)    Albumin 3.4 (*)    All other components within normal limits  I-STAT CHEM 8,  ED - Abnormal; Notable for the following components:   Glucose, Bld 109 (*)    Calcium, Ion 1.14 (*)    All other components within normal limits  RESP PANEL BY RT-PCR (FLU A&B, COVID) ARPGX2  ETHANOL  PROTIME-INR  APTT  CBC  DIFFERENTIAL  RAPID URINE DRUG SCREEN, HOSP PERFORMED  URINALYSIS, ROUTINE W REFLEX MICROSCOPIC    EKG None  Radiology CT HEAD WO CONTRAST  Result Date: 06/18/2021 CLINICAL DATA:   Sudden onset right arm heaviness, garbled speech and disorientation starting at 05:25 this morning with spontaneous resolution. EXAM: CT HEAD WITHOUT CONTRAST TECHNIQUE: Contiguous axial images were obtained from the base of the skull through the vertex without intravenous contrast. COMPARISON:  CT head 05/19/2021 FINDINGS: Brain: No evidence of acute infarction, hemorrhage, hydrocephalus, extra-axial collection or mass lesion/mass effect. Ventricles and sulci are appropriate for patient age. Vascular: No hyperdense vessel or unexpected calcification. Skull: Normal. Negative for fracture or focal lesion. Sinuses/Orbits: No acute finding. Mild mucosal thickening of a few bilateral anterior ethmoid air cells. Other: None. IMPRESSION: No acute intracranial abnormality. Electronically Signed   By: Ileana Roup M.D.   On: 06/18/2021 09:05    Procedures Procedures   Medications Ordered in ED Medications - No data to display  ED Course  I have reviewed the triage vital signs and the nursing notes.  Pertinent labs & imaging results that were available during my care of the patient were reviewed by me and considered in my medical decision making (see chart for details).    MDM Rules/Calculators/A&P                           70 year old female presents emergency department with an episode of right upper extremity heaviness and speech change.  She feels like the right upper extremity is not back to normal however objectively on exam she is neuro intact.  NIH is 0.  Speech is articulate and clear.  Vital signs are stable besides mild hypertension.  Blood work is reassuring without any acute findings.  CT of the head is unremarkable.  She is had MRI imaging in the past that has been negative.  Given the fact that she is still has ongoing right upper extremity heaviness we will pursue MRI imaging to rule out CVA.  If this is negative patient symptoms are seeming more consistent and concerning for TIA, may  consider neuro consult and possible TIA work-up.  Patient pending MRI imaging and reevaluation. Patient signed out.  Final Clinical Impression(s) / ED Diagnoses Final diagnoses:  None    Rx / DC Orders ED Discharge Orders     None        Lorelle Gibbs, DO 06/18/21 1631

## 2021-06-19 ENCOUNTER — Ambulatory Visit (HOSPITAL_BASED_OUTPATIENT_CLINIC_OR_DEPARTMENT_OTHER): Admission: RE | Admit: 2021-06-19 | Payer: Medicare Other | Source: Ambulatory Visit

## 2021-06-21 ENCOUNTER — Ambulatory Visit (HOSPITAL_BASED_OUTPATIENT_CLINIC_OR_DEPARTMENT_OTHER): Payer: Medicare Other

## 2021-06-22 ENCOUNTER — Ambulatory Visit: Payer: Medicare Other | Admitting: Physical Therapy

## 2021-06-22 ENCOUNTER — Other Ambulatory Visit: Payer: Self-pay

## 2021-06-22 DIAGNOSIS — G8929 Other chronic pain: Secondary | ICD-10-CM

## 2021-06-22 NOTE — Therapy (Signed)
[facility] [address]  [place], [place], [address] Phone: [phone]   Fax:  [phone]  Patient Details  Name: Monica Wallace MRN: [id number] Date of Birth: Sep 25, 1950 Referring Provider:  [name], MD  Encounter Date: 06/22/2021  Darryll Capers arrived for her new eval for her ongoing chronic neck pain but expressed concerns about proceeding with PT at this time due recent episodes she hash ben experiencing that have ended up with her in the ED 2 times in the last 2 months. She relays that the initial episodes were thought to be orthostatic hypotension and her BP meds were changed but more recent episodes have not been related to positional changes. She states all testing and imaging completed in the ED was inconclusive and she has specialists visits coming for further assessment. She requested to defer PT eval and treat for now, pending the outcome of these upcoming visits and will reach out to reschedule her PT eval once she feels better able to focus her attention on PT.  Percival Spanish, PT 06/22/2021, 5:24 PM  [facility] [address]  [place], [place], [address] Phone: [phone]   Fax:  [phone]

## 2021-06-22 NOTE — Chronic Care Management (AMB) (Signed)
  Care Management   Note  06/22/2021 Name: Rodina Pinales MRN: [id number] DOB: 11-12-1950  Lakhia Gengler is a 70 y.o. year old female who is a primary care patient of [name], MD and is actively engaged with the care management team. I reached out to Myrtice Lauth by phone today to assist with re-scheduling a follow up visit with the RN Case Manager  Follow up plan: Unsuccessful telephone outreach attempt made. A HIPAA compliant phone message was left for the patient providing contact information and requesting a return call.   Julian Hy, [facility]  Direct Dial: [phone]

## 2021-06-23 ENCOUNTER — Ambulatory Visit (HOSPITAL_COMMUNITY): Payer: Medicare Other | Attending: Cardiology

## 2021-06-23 DIAGNOSIS — R55 Syncope and collapse: Secondary | ICD-10-CM | POA: Diagnosis not present

## 2021-06-23 LAB — ECHOCARDIOGRAM COMPLETE
Area-P 1/2: 3.19 cm2
S' Lateral: 2.6 cm

## 2021-06-26 ENCOUNTER — Other Ambulatory Visit: Payer: Self-pay

## 2021-06-26 ENCOUNTER — Ambulatory Visit (HOSPITAL_BASED_OUTPATIENT_CLINIC_OR_DEPARTMENT_OTHER)
Admission: RE | Admit: 2021-06-26 | Discharge: 2021-06-26 | Disposition: A | Discharge status: Home / Self Care | Payer: Medicare Other | Source: Ambulatory Visit | Attending: Internal Medicine | Admitting: Internal Medicine

## 2021-06-26 DIAGNOSIS — R590 Localized enlarged lymph nodes: Secondary | ICD-10-CM | POA: Diagnosis not present

## 2021-06-26 DIAGNOSIS — C73 Malignant neoplasm of thyroid gland: Secondary | ICD-10-CM | POA: Insufficient documentation

## 2021-06-26 DIAGNOSIS — E890 Postprocedural hypothyroidism: Secondary | ICD-10-CM | POA: Diagnosis not present

## 2021-06-28 ENCOUNTER — Encounter: Payer: Medicare Other | Admitting: Physical Therapy

## 2021-06-29 ENCOUNTER — Telehealth: Payer: Medicare Other

## 2021-06-29 ENCOUNTER — Other Ambulatory Visit: Payer: Self-pay

## 2021-06-29 ENCOUNTER — Encounter: Payer: Self-pay | Admitting: Neurology

## 2021-06-29 ENCOUNTER — Ambulatory Visit (INDEPENDENT_AMBULATORY_CARE_PROVIDER_SITE_OTHER): Payer: Medicare Other | Admitting: Neurology

## 2021-06-29 VITALS — BP 158/79 | HR 79 | Ht 62.0 in | Wt 205.0 lb

## 2021-06-29 DIAGNOSIS — R29898 Other symptoms and signs involving the musculoskeletal system: Secondary | ICD-10-CM

## 2021-06-29 DIAGNOSIS — R6889 Other general symptoms and signs: Secondary | ICD-10-CM

## 2021-06-29 NOTE — Patient Instructions (Signed)
Continue current medications  We will check a B12 level today  Follow up with your primary care doctor and return if worse

## 2021-06-29 NOTE — Progress Notes (Signed)
[facility]  PATIENT: Monica Wallace DOB: [birth year]  REQUESTING CLINICIAN: Charlesetta Shanks, MD HISTORY FROM: Patient REASON FOR VISIT: Recurrent episode of bilateral lower extremities weakness   HISTORICAL  CHIEF COMPLAINT:  Chief Complaint  Patient presents with   New Patient (Initial Visit)    RM 12, alone. C/o leg weakness.    HISTORY OF PRESENT ILLNESS:  This is a 70 year old woman with past medical history of asthma, hypertension, thyroid disease and diabetes mellitus type 2 who is presenting with recurrent episodes of dizziness associated with lower extremities weakness.  Patient reports a total of 4 episodes of lightheadedness associated with weakness of both legs since the summer, the last episode was on December 2 and what was different is that it was associated with right arm heaviness.  Patient said on on that morning she woke up to reach for her alarm clock and noted that her right arm fell very heavy then she got up to use the bathroom and had difficulty walking due to weakness of the lower extremities, therefore she called for help and was taken to the ED.  On arrival at the ED her weakness improved but she still reports ongoing brain fog.  She had extensive work-up including 2 MRIs brain, MRA head and neck, MRI cervical spine and echocardiogram of her heart, all within normal limits.  Her other testing including TSH was normal and a hemoglobin A1c was at  6.4.  During this time also she has followed up with her primary cardiologist and has some of her blood pressure medications changed but she still experiences these episodes, denies falls.    OTHER MEDICAL CONDITIONS: [place], HTN, Thyroid disease, DM   REVIEW OF SYSTEMS: Full 14 system review of systems performed and negative with exception of: as noted in the HPI  ALLERGIES: Allergies  Allergen Reactions   Levaquin [Levofloxacin] Shortness Of Breath   Clindamycin/Lincomycin     Pruritus No rash    Gabapentin Itching   Penicillins     Positive on allergy testing 'years ago.' Has never had penicillin. Has tolerated amoxicillin.   Codeine Itching    HOME MEDICATIONS: Outpatient Medications Prior to Visit  Medication Sig Dispense Refill   acetaminophen (TYLENOL) 325 MG tablet Take 650 mg by mouth every 6 (six) hours as needed.     albuterol (VENTOLIN HFA) 108 (90 Base) MCG/ACT inhaler INHALE 2 PUFFS INTO THE LUNGS EVERY 6 HOURS AS NEEDED FOR WHEEZING OR SHORTNESS OF BREATH 25.5 g 1   azelastine (ASTELIN) 0.1 % nasal spray Place 1 spray in each nostril twice a day as needed for runny nose/drainage down throat (Patient taking differently: Place 1 spray into both nostrils daily.) 90 mL 1   Blood Glucose Monitoring Suppl (ONETOUCH VERIO FLEX SYSTEM) w/Device KIT Use to check blood glucose once a day.  DX code: E11.9 1 kit 0   budesonide (PULMICORT) 180 MCG/ACT inhaler Inhale 1-2 puffs into the lungs 2 (two) times daily. 3 each 1   Cholecalciferol ([employer] VIT D 5000 HIGH-POTENCY PO) Take 5,000 Units by mouth daily.     estradiol (ESTRACE VAGINAL) 0.1 MG/GM vaginal cream Place 1 Applicatorful vaginally at bedtime. 42.5 g 12   fluticasone (FLONASE) 50 MCG/ACT nasal spray Place 2 sprays into both nostrils daily. 16 g 6   furosemide (LASIX) 20 MG tablet Take 1 tablet (20 mg total) by mouth daily. 90 tablet 3   glucose blood (ONETOUCH VERIO) test strip Use to check blood glucose once a day.  DX code: E11.9 100 each 1   hyoscyamine (LEVSIN SL) 0.125 MG SL tablet Place 1 tablet (0.125 mg total) under the tongue every 4 (four) hours as needed. 30 tablet 1   Lancets (ONETOUCH DELICA PLUS EHUDJS97W) MISC Use to check blood glucose once a day.  DX code: E11.9 100 each 1   loratadine (CLARITIN) 10 MG tablet Take 10 mg by mouth in the morning and at bedtime.      montelukast (SINGULAIR) 10 MG tablet TAKE 1 TABLET(10 MG) BY MOUTH DAILY AS NEEDED (Patient taking differently: Take 10 mg by mouth at bedtime.) 90  tablet 1   Omega-3 1000 MG CAPS Take 1,000 mg by mouth daily.     omeprazole (PRILOSEC) 40 MG capsule Take 1 capsule by mouth daily.     rosuvastatin (CRESTOR) 5 MG tablet TAKE 1 TABLET BY MOUTH 2 TIMES A WEEK(TUESDAYS AND SUNDAYS WEEKLY) 25 tablet 1   SYNTHROID 150 MCG tablet TAKE 1 TABLET(150 MCG) BY MOUTH DAILY BEFORE AND BREAKFAST (Patient taking differently: Take 150 mcg by mouth daily before breakfast.) 90 tablet 1   No facility-administered medications prior to visit.    PAST MEDICAL HISTORY: Past Medical History:  Diagnosis Date   Arthritis    Asthma    Baker's cyst of knee, right 02/16/2017   Cancer ([place]) 2002   thyroid (2002)   Cystic breast    Diabetes ([place])    GERD (gastroesophageal reflux disease)    Hepatic cyst 02/02/2015   Hyperlipidemia    Hypertension    Insomnia 07/23/2016   Menopause    Migraine    Palpitations    Preventative health care 01/18/2016   Sleep apnea 02/02/2015   Thyroid disease     PAST SURGICAL HISTORY: Past Surgical History:  Procedure Laterality Date   ABDOMINAL HYSTERECTOMY     took right ovary and uterus   APPENDECTOMY     CARPAL TUNNEL RELEASE Right    CESAREAN SECTION     CHOLECYSTECTOMY     EYE SURGERY Bilateral 2017   cataracts   KNEE ARTHROPLASTY     SHOULDER ARTHROSCOPY W/ ROTATOR CUFF REPAIR Left    THYROIDECTOMY  2002    FAMILY HISTORY: Family History  Problem Relation Age of Onset   Arthritis Mother    Transient ischemic attack Mother    Hypertension Mother    Hyperlipidemia Mother    Diabetes Father    Heart disease Father    Arthritis Father    Kidney disease Father    Hypertension Father    Hyperlipidemia Father    Cancer Sister 53       pancreatic   Fibromyalgia Sister    Allergic rhinitis Sister    Asthma Maternal Grandfather        smoker   Depression Maternal Grandfather    Dementia Paternal Grandmother    Diabetes Paternal Grandfather    Hypertension Brother    Allergic rhinitis Brother     Scoliosis Daughter        had rods placed and removed   Arthritis Sister    Gout Sister    GER disease Sister    Cancer Sister        breast cancer   Breast cancer Sister 64    SOCIAL HISTORY: Social History   Socioeconomic History   Marital status: Divorced    Spouse name: Not on file   Number of children: 2   Years of education: Not on file   Highest education  level: Not on file  Occupational History   Not on file  Tobacco Use   Smoking status: Never   Smokeless tobacco: Never  Vaping Use   Vaping Use: Never used  Substance and Sexual Activity   Alcohol use: Yes    Alcohol/week: 0.0 standard drinks    Comment: very rare once or twice a year   Drug use: No   Sexual activity: Yes    Birth control/protection: Other-see comments, Surgical    Comment:  boyfriend  Other Topics Concern   Not on file  Social History Narrative   Not on file   Social Determinants of Health   Financial Resource Strain: Low Risk    Difficulty of Paying Living Expenses: Not hard at all  Food Insecurity: No Food Insecurity   Worried About Charity fundraiser in the Last Year: Never true   [place] in the Last Year: Never true  Transportation Needs: No Transportation Needs   Lack of Transportation (Medical): No   Lack of Transportation (Non-Medical): No  Physical Activity: Inactive   Days of Exercise per Week: 0 days   Minutes of Exercise per Session: 0 min  Stress: No Stress Concern Present   Feeling of Stress : Not at all  Social Connections: Moderately Isolated   Frequency of Communication with Friends and Family: More than three times a week   Frequency of Social Gatherings with Friends and Family: More than three times a week   Attends Religious Services: More than 4 times per year   Active Member of [employer] or Organizations: No   Attends Archivist Meetings: Never   Marital Status: Divorced  Human resources officer Violence: Not At Risk   Fear of Current or Ex-Partner:  No   Emotionally Abused: No   Physically Abused: No   Sexually Abused: No    PHYSICAL EXAM  GENERAL EXAM/CONSTITUTIONAL: Vitals:  Vitals:   06/29/21 1058  BP: (!) 158/79  Pulse: 79  Weight: 205 lb (93 kg)  Height: 5' 2"  (1.575 m)   Body mass index is 37.49 kg/m. Wt Readings from Last 3 Encounters:  06/29/21 205 lb (93 kg)  06/08/21 205 lb (93 kg)  05/26/21 205 lb 3.2 oz (93.1 kg)   Patient is in no distress; well developed, nourished and groomed; neck is supple  CARDIOVASCULAR: Examination of carotid arteries is normal; no carotid bruits Regular rate and rhythm, no murmurs Examination of peripheral vascular system by observation and palpation is normal  EYES: Pupils round and reactive to light, Visual fields full to confrontation, Extraocular movements intacts,   MUSCULOSKELETAL: Gait, strength, tone, movements noted in Neurologic exam below  NEUROLOGIC: MENTAL STATUS:  MMSE - [place] Exam 07/21/2016  Orientation to time 5  Orientation to Place 5  Registration 3  Attention/ Calculation 5  Recall 3  Language- name 2 objects 2  Language- repeat 1  Language- follow 3 step command 3  Language- read & follow direction 1  Write a sentence 1  Copy design 1  Total score 30   awake, alert, oriented to person, place and time recent and remote memory intact normal attention and concentration language fluent, comprehension intact, naming intact fund of knowledge appropriate  CRANIAL NERVE:  2nd, 3rd, 4th, 6th - pupils equal and reactive to light, visual fields full to confrontation, extraocular muscles intact, no nystagmus 5th - facial sensation symmetric 7th - facial strength symmetric 8th - hearing intact 9th - palate elevates symmetrically, uvula  midline 11th - shoulder shrug symmetric 12th - tongue protrusion midline  MOTOR:  normal bulk and tone, full strength in the BUE, BLE  SENSORY:  normal and symmetric to light touch, pinprick,  temperature, vibration  COORDINATION:  finger-nose-finger, fine finger movements normal  REFLEXES:  deep tendon reflexes present and symmetric  GAIT/STATION:  normal    DIAGNOSTIC DATA (LABS, IMAGING, TESTING) - I reviewed patient records, labs, notes, testing and imaging myself where available.  Lab Results  Component Value Date   WBC 9.7 06/18/2021   HGB 13.9 06/18/2021   HCT 41.0 06/18/2021   MCV 90.8 06/18/2021   PLT 315 06/18/2021      Component Value Date/Time   NA 140 06/18/2021 0840   NA 139 06/17/2021 1617   K 3.7 06/18/2021 0840   CL 102 06/18/2021 0840   CO2 30 06/18/2021 0815   GLUCOSE 109 (H) 06/18/2021 0840   BUN 13 06/18/2021 0840   BUN 15 06/17/2021 1617   CREATININE 0.70 06/18/2021 0840   CREATININE 0.93 03/05/2021 1519   CALCIUM 9.5 06/18/2021 0815   PROT 6.7 06/18/2021 0815   PROT 6.9 11/30/2020 1643   ALBUMIN 3.4 (L) 06/18/2021 0815   ALBUMIN 4.1 11/30/2020 1643   AST 23 06/18/2021 0815   ALT 25 06/18/2021 0815   ALKPHOS 71 06/18/2021 0815   BILITOT 0.4 06/18/2021 0815   BILITOT <0.2 11/30/2020 1643   GFRNONAA >60 06/18/2021 0815   GFRAA >60 01/16/2020 0404   Lab Results  Component Value Date   CHOL 199 03/02/2021   HDL 52.10 03/02/2021   LDLCALC 114 (H) 03/02/2021   LDLDIRECT 86.0 10/29/2020   TRIG 164.0 (H) 03/02/2021   CHOLHDL 4 03/02/2021   Lab Results  Component Value Date   HGBA1C 6.4 03/02/2021   No results found for: [id number] Lab Results  Component Value Date   TSH 0.69 06/08/2021    MRI Brain 06/18/2021 No evidence of recent infarction, hemorrhage, or mass. No significant change since prior study   MRI Brain, MRA head and neck 05/19/2021: 1. Normal MRI of the brain. 2. Normal MRA of the head and neck   MRI Cervical spine 02/22/2020 1. Moderate right C3-4 and C4-5 neural foraminal stenosis. 2. Moderate left C5-6 neural foraminal stenosis. 3. No spinal canal stenosis.    ASSESSMENT AND PLAN  70 y.o. year  old female with diabetes mellitus type 2, hypothyroidism, asthma and hypertension who is presenting with a total of 4 episodes of dizziness associated with bilateral lower extremities weakness/heaviness.  There are no falls associated with these events.  She has had extensive work-up including MRI Brain and neck, MRAs Head and neck and echocardiogram without clear etiology.  During this time she was not found to be hypoglycemic, her recent hemoglobin A1c is 6.4.  Her recent TSH was normal at 0.69.  We checked her orthostatic vitals here in the office and there was no evidence of orthostatic hypotension.  At this point, the etiology of her symptoms is still unclear but I will obtain a vitamin B12 level.  She does not complain of pain, no back pain, no lower extremities pain and denies any neuropathic pain.  She also denies muscle pain, we will continue to monitor her and advised the patient to return sooner if her symptoms got worse, at that time we might consider referring her to one of my neuromuscular colleagues.   1. Weakness of both lower extremities   2. Other general symptoms and signs  PLAN: Continue current medications  We will check a B12 level today  Follow up with your primary care doctor and return if worse   Orders Placed This Encounter  Procedures   Vitamin B12    No orders of the defined types were placed in this encounter.   Return if symptoms worsen or fail to improve.    Alric Ran, MD 06/29/2021, 2:30 PM  [facility] [address], [place], [place] [address] [phone]

## 2021-06-30 LAB — VITAMIN B12: Vitamin B-12: 577 pg/mL (ref 232–1245)

## 2021-06-30 NOTE — Chronic Care Management (AMB) (Signed)
°  Care Management   Note  06/30/2021 Name: Sadaf Przybysz MRN: [id number] DOB: [birth year]  Jenifer Struve is a 70 y.o. year old female who is a primary care patient of [name], MD and is actively engaged with the care management team. I reached out to Myrtice Lauth by phone today to assist with re-scheduling a follow up visit with the RN Case Manager  Follow up plan: Telephone appointment with care management team member scheduled for: 07/01/2021  Julian Hy, [place], [place] Management  Direct Dial: [phone]

## 2021-07-01 ENCOUNTER — Telehealth: Payer: Medicare Other

## 2021-07-01 ENCOUNTER — Telehealth: Payer: Self-pay

## 2021-07-01 NOTE — Telephone Encounter (Signed)
°  Care Management   Follow Up Note   07/01/2021 Name: Monica Wallace MRN: [id number] DOB: 1950/12/01   Referred by: [name], MD Reason for referral : Chronic Care Management (RNCM follow up)   An unsuccessful telephone outreach was attempted today. The patient was referred to the case management team for assistance with care management and care coordination.  RNCM made numerous attempts to call on mobile number, however, call was not able to go through. RNCM called home number. HIPPA compliant phone message was left for the patient provider contact information and requesting a return call.   Follow Up Plan: The care management team will reach out to the patient again over the next 30 days.   Thea Silversmith, RN, MSN, BSN, CCM Care Management Coordinator [facility] [phone]

## 2021-07-09 ENCOUNTER — Other Ambulatory Visit: Payer: Self-pay | Admitting: Family Medicine

## 2021-07-15 ENCOUNTER — Encounter: Payer: Medicare Other | Admitting: Physical Therapy

## 2021-07-16 ENCOUNTER — Other Ambulatory Visit: Payer: Self-pay

## 2021-07-16 ENCOUNTER — Emergency Department (HOSPITAL_BASED_OUTPATIENT_CLINIC_OR_DEPARTMENT_OTHER)
Admission: EM | Admit: 2021-07-16 | Discharge: 2021-07-16 | Disposition: A | Discharge status: Home / Self Care | Payer: Medicare Other | Attending: Emergency Medicine | Admitting: Emergency Medicine

## 2021-07-16 ENCOUNTER — Emergency Department (HOSPITAL_BASED_OUTPATIENT_CLINIC_OR_DEPARTMENT_OTHER): Payer: Medicare Other

## 2021-07-16 ENCOUNTER — Encounter (HOSPITAL_BASED_OUTPATIENT_CLINIC_OR_DEPARTMENT_OTHER): Payer: Self-pay | Admitting: Emergency Medicine

## 2021-07-16 DIAGNOSIS — R202 Paresthesia of skin: Secondary | ICD-10-CM | POA: Insufficient documentation

## 2021-07-16 DIAGNOSIS — J4540 Moderate persistent asthma, uncomplicated: Secondary | ICD-10-CM | POA: Diagnosis not present

## 2021-07-16 DIAGNOSIS — E119 Type 2 diabetes mellitus without complications: Secondary | ICD-10-CM | POA: Insufficient documentation

## 2021-07-16 DIAGNOSIS — Z8616 Personal history of COVID-19: Secondary | ICD-10-CM | POA: Diagnosis not present

## 2021-07-16 DIAGNOSIS — I10 Essential (primary) hypertension: Secondary | ICD-10-CM | POA: Diagnosis not present

## 2021-07-16 DIAGNOSIS — R531 Weakness: Secondary | ICD-10-CM | POA: Diagnosis not present

## 2021-07-16 DIAGNOSIS — Z85850 Personal history of malignant neoplasm of thyroid: Secondary | ICD-10-CM | POA: Diagnosis not present

## 2021-07-16 DIAGNOSIS — E039 Hypothyroidism, unspecified: Secondary | ICD-10-CM | POA: Insufficient documentation

## 2021-07-16 DIAGNOSIS — R200 Anesthesia of skin: Secondary | ICD-10-CM | POA: Diagnosis not present

## 2021-07-16 DIAGNOSIS — Z79899 Other long term (current) drug therapy: Secondary | ICD-10-CM | POA: Diagnosis not present

## 2021-07-16 DIAGNOSIS — Z7951 Long term (current) use of inhaled steroids: Secondary | ICD-10-CM | POA: Insufficient documentation

## 2021-07-16 LAB — COMPREHENSIVE METABOLIC PANEL
ALT: 21 U/L (ref 0–44)
AST: 25 U/L (ref 15–41)
Albumin: 3.8 g/dL (ref 3.5–5.0)
Alkaline Phosphatase: 75 U/L (ref 38–126)
Anion gap: 9 (ref 5–15)
BUN: 16 mg/dL (ref 8–23)
CO2: 28 mmol/L (ref 22–32)
Calcium: 9.6 mg/dL (ref 8.9–10.3)
Chloride: 99 mmol/L (ref 98–111)
Creatinine, Ser: 0.84 mg/dL (ref 0.44–1.00)
GFR, Estimated: >60 mL/min (ref >60)
Glucose, Bld: 110 mg/dL — ABNORMAL HIGH (ref 70–99)
Potassium: 3.2 mmol/L — ABNORMAL LOW (ref 3.5–5.1)
Sodium: 136 mmol/L (ref 135–145)
Total Bilirubin: 0.3 mg/dL (ref 0.3–1.2)
Total Protein: 7.2 g/dL (ref 6.5–8.1)

## 2021-07-16 LAB — CBC WITH DIFFERENTIAL/PLATELET
Abs Immature Granulocytes: 0.04 10*3/uL (ref 0.00–0.07)
Basophils Absolute: 0 10*3/uL (ref 0.0–0.1)
Basophils Relative: 0 %
Eosinophils Absolute: 0.1 10*3/uL (ref 0.0–0.5)
Eosinophils Relative: 1 %
HCT: 39.9 % (ref 36.0–46.0)
Hemoglobin: 13.5 g/dL (ref 12.0–15.0)
Immature Granulocytes: 0 %
Lymphocytes Relative: 27 %
Lymphs Abs: 3.1 10*3/uL (ref 0.7–4.0)
MCH: 30.1 pg (ref 26.0–34.0)
MCHC: 33.8 g/dL (ref 30.0–36.0)
MCV: 88.9 fL (ref 80.0–100.0)
Monocytes Absolute: 0.9 10*3/uL (ref 0.1–1.0)
Monocytes Relative: 8 %
Neutro Abs: 7 10*3/uL (ref 1.7–7.7)
Neutrophils Relative %: 64 %
Platelets: 319 10*3/uL (ref 150–400)
RBC: 4.49 MIL/uL (ref 3.87–5.11)
RDW: 13.2 % (ref 11.5–15.5)
WBC: 11.1 10*3/uL — ABNORMAL HIGH (ref 4.0–10.5)
nRBC: 0 % (ref 0.0–0.2)

## 2021-07-16 LAB — URINALYSIS, ROUTINE W REFLEX MICROSCOPIC
Bilirubin Urine: NEGATIVE
Glucose, UA: NEGATIVE mg/dL
Hgb urine dipstick: NEGATIVE
Ketones, ur: NEGATIVE mg/dL
Leukocytes,Ua: NEGATIVE
Nitrite: NEGATIVE
Protein, ur: NEGATIVE mg/dL
Specific Gravity, Urine: 1.025 (ref 1.005–1.030)
pH: 7 (ref 5.0–8.0)

## 2021-07-16 LAB — CBG MONITORING, ED: Glucose-Capillary: 153 mg/dL — ABNORMAL HIGH (ref 70–99)

## 2021-07-16 NOTE — Discharge Instructions (Signed)
Follow-up with your neurologist, also given information to follow-up with Dr. Carles Collet with neurology and movement disorders. Return to the emergency room for any worsening or concerning symptoms.

## 2021-07-16 NOTE — ED Provider Notes (Signed)
[facility] EMERGENCY DEPARTMENT Provider Note   CSN: [id number] Arrival date & time: 07/16/21  1256     History Chief Complaint  Patient presents with   Numbness    Monica Wallace is a 70 y.o. female.  70 year old female with past medical history of hypertension, hyperlipidemia, asthma and former thyroid cancer presents with complaint of numbness to the right side of her body onset 10 AM yesterday morning, constant since.  Patient states that she has had 4-5 episodes in the past few months of weakness in her legs or altered sensation, difficulty ambulating.  Patient has been seen in the ER for this, had CT scans and MRIs as well as MRA, follow-up with cardiology and neurology with no source for her symptoms found.  States that she has had some difficulty walking with this episode, denies changes in vision, speech.      Past Medical History:  Diagnosis Date   Arthritis    Asthma    Baker's cyst of knee, right 02/16/2017   Cancer ([place]) 2002   thyroid (2002)   Cystic breast    Diabetes ([place])    GERD (gastroesophageal reflux disease)    Hepatic cyst 02/02/2015   Hyperlipidemia    Hypertension    Insomnia 07/23/2016   Menopause    Migraine    Palpitations    Preventative health care 01/18/2016   Sleep apnea 02/02/2015   Thyroid disease     Patient Active Problem List   Diagnosis Date Noted   Cervical cancer screening 03/07/2021   Sinusitis 10/30/2020   Allergic conjunctivitis 04/08/2020   Neck pain 02/06/2020   Acute bronchitis 01/13/2020   Otitis media 09/30/2019   Hip pain, right 09/30/2019   COVID-19 09/30/2019   Educated about COVID-19 virus infection 12/05/2018   High risk heterosexual behavior 11/20/2017   Baker's cyst of knee, right 02/16/2017   Insomnia 07/23/2016   Hypokalemia 07/23/2016   Controlled type 2 diabetes mellitus without complication, without long-term current use of insulin ([place]) 01/24/2016   Preventative health care 01/18/2016    Left-sided low back pain with left-sided sciatica 10/07/2015   Obesity (BMI 30-39.9) 06/05/2015   OSA (obstructive sleep apnea) 02/02/2015   Palpitations 09/19/2014   Plantar fasciitis, bilateral 11/19/2013   Pronation deformity of ankle, acquired 11/19/2013   DJD (degenerative joint disease) 08/26/2013   Fibrocystic breast 08/26/2013   HTN (hypertension) 08/26/2013   GERD (gastroesophageal reflux disease) 08/26/2013   S/P hysterectomy with oophorectomy 08/26/2013   Hypothyroidism 08/26/2013   Chronic hoarseness 08/26/2013   Colon polyp 08/26/2013   Hiatal hernia 08/26/2013   Irritable bowel syndrome 08/26/2013   Moderate persistent asthma 08/26/2013   Allergic rhinoconjunctivitis 08/26/2013   Hyperlipidemia 08/22/2013   Migraine 08/22/2013   Thyroid cancer ([place]) 08/22/2013    Past Surgical History:  Procedure Laterality Date   ABDOMINAL HYSTERECTOMY     took right ovary and uterus   APPENDECTOMY     CARPAL TUNNEL RELEASE Right    CESAREAN SECTION     CHOLECYSTECTOMY     EYE SURGERY Bilateral 2017   cataracts   KNEE ARTHROPLASTY     SHOULDER ARTHROSCOPY W/ ROTATOR CUFF REPAIR Left    THYROIDECTOMY  2002     OB History     Gravida  2   Para  2   Term  2   Preterm      AB      Living  2      SAB  IAB      Ectopic      Multiple      Live Births  2           Family History  Problem Relation Age of Onset   Arthritis Mother    Transient ischemic attack Mother    Hypertension Mother    Hyperlipidemia Mother    Diabetes Father    Heart disease Father    Arthritis Father    Kidney disease Father    Hypertension Father    Hyperlipidemia Father    Cancer Sister 64       pancreatic   Fibromyalgia Sister    Allergic rhinitis Sister    Asthma Maternal Grandfather        smoker   Depression Maternal Grandfather    Dementia Paternal Grandmother    Diabetes Paternal Grandfather    Hypertension Brother    Allergic rhinitis Brother     Scoliosis Daughter        had rods placed and removed   Arthritis Sister    Gout Sister    GER disease Sister    Cancer Sister        breast cancer   Breast cancer Sister 8    Social History   Tobacco Use   Smoking status: Never   Smokeless tobacco: Never  Vaping Use   Vaping Use: Never used  Substance Use Topics   Alcohol use: Yes    Alcohol/week: 0.0 standard drinks    Comment: very rare once or twice a year   Drug use: No    Home Medications Prior to Admission medications   Medication Sig Start Date End Date Taking? Authorizing Provider  acetaminophen (TYLENOL) 325 MG tablet Take 650 mg by mouth every 6 (six) hours as needed.    [provider]  albuterol (VENTOLIN HFA) 108 (90 Base) MCG/ACT inhaler INHALE 2 PUFFS INTO THE LUNGS EVERY 6 HOURS AS NEEDED FOR WHEEZING OR SHORTNESS OF BREATH 10/01/19   [name], MD  azelastine (ASTELIN) 0.1 % nasal spray Place 1 spray in each nostril twice a day as needed for runny nose/drainage down throat Patient taking differently: Place 1 spray into both nostrils daily. 11/19/20   Althea Charon, FNP  Blood Glucose Monitoring Suppl ([place]) w/Device KIT Use to check blood glucose once a day.  DX code: E11.9 02/06/20   [name], MD  budesonide (PULMICORT) 180 MCG/ACT inhaler Inhale 1-2 puffs into the lungs 2 (two) times daily. 11/19/20   Althea Charon, FNP  Cholecalciferol (CVS VIT D 5000 HIGH-POTENCY PO) Take 5,000 Units by mouth daily.    [provider]  estradiol (ESTRACE VAGINAL) 0.1 MG/GM vaginal cream Place 1 Applicatorful vaginally at bedtime. 04/28/21   Lavonia Drafts, MD  fluticasone (FLONASE) 50 MCG/ACT nasal spray SHAKE LIQUID AND USE 2 SPRAYS IN [facility] NOSTRIL DAILY 07/13/21   [name], MD  furosemide (LASIX) 20 MG tablet Take 1 tablet (20 mg total) by mouth daily. 06/03/21 09/01/21  Lelon Perla, MD  glucose blood (ONETOUCH VERIO) test strip Use to check blood  glucose once a day.  DX code: E11.9 02/06/20   [name], MD  hyoscyamine (LEVSIN SL) 0.125 MG SL tablet Place 1 tablet (0.125 mg total) under the tongue every 4 (four) hours as needed. 03/02/21   [name], MD  Lancets (ONETOUCH DELICA PLUS [id number]) MISC Use to check blood glucose once a day.  DX code: E11.9  02/06/20   [name], MD  loratadine (CLARITIN) 10 MG tablet Take 10 mg by mouth in the morning and at bedtime.     [provider]  montelukast (SINGULAIR) 10 MG tablet TAKE 1 TABLET(10 MG) BY MOUTH DAILY AS NEEDED Patient taking differently: Take 10 mg by mouth at bedtime. 01/26/21   Althea Charon, FNP  Omega-3 1000 MG CAPS Take 1,000 mg by mouth daily.    [provider]  omeprazole (PRILOSEC) 40 MG capsule Take 1 capsule by mouth daily. 10/28/20   [provider]  rosuvastatin (CRESTOR) 5 MG tablet TAKE 1 TABLET BY MOUTH 2 TIMES A WEEK(TUESDAYS AND SUNDAYS WEEKLY) 06/01/21   [name], MD  SYNTHROID 150 MCG tablet TAKE 1 TABLET(150 MCG) BY MOUTH DAILY BEFORE AND BREAKFAST Patient taking differently: Take 150 mcg by mouth daily before breakfast. 04/02/21   [name], MD    Allergies    Levaquin [levofloxacin], Clindamycin/lincomycin, Gabapentin, Penicillins, and Codeine  Review of Systems   Review of Systems  Constitutional:  Negative for chills and fever.  Eyes:  Negative for visual disturbance.  Respiratory:  Negative for shortness of breath.   Cardiovascular:  Negative for chest pain.  Gastrointestinal:  Negative for abdominal pain, nausea and vomiting.  Genitourinary:  Negative for dysuria.  Musculoskeletal:  Negative for arthralgias and myalgias.  Skin:  Negative for rash and wound.  Allergic/Immunologic: Positive for immunocompromised state.  Neurological:  Positive for weakness and numbness. Negative for dizziness, facial asymmetry, speech difficulty and headaches.  Psychiatric/Behavioral:  Negative for confusion.    All other systems reviewed and are negative.  Physical Exam Updated Vital Signs BP (!) 146/73    Pulse 79    Temp 97.8 F (36.6 C) (Oral)    Resp 15    Ht _0  (1.575 m)    Wt 91.9 kg    SpO2 98%    BMI 37.06 kg/m   Physical Exam Vitals and nursing note reviewed.  Constitutional:      General: She is not in acute distress.    Appearance: She is well-developed. She is not diaphoretic.  HENT:     Head: Normocephalic and atraumatic.     Nose: Nose normal.     Mouth/Throat:     Mouth: Mucous membranes are moist.     Pharynx: No oropharyngeal exudate or posterior oropharyngeal erythema.  Eyes:     General: No visual field deficit.    Extraocular Movements: Extraocular movements intact.     Conjunctiva/sclera: Conjunctivae normal.     Pupils: Pupils are equal, round, and reactive to light.  Cardiovascular:     Rate and Rhythm: Normal rate and regular rhythm.     Heart sounds: Normal heart sounds.  Pulmonary:     Effort: Pulmonary effort is normal.     Breath sounds: Normal breath sounds.  Abdominal:     Palpations: Abdomen is soft.     Tenderness: There is no abdominal tenderness.  Musculoskeletal:     Cervical back: Neck supple.     Right lower leg: No edema.     Left lower leg: No edema.  Skin:    General: Skin is warm and dry.     Findings: No erythema or rash.  Neurological:     Mental Status: She is alert and oriented to person, place, and time.     GCS: GCS eye subscore is 4. GCS verbal subscore is 5. GCS motor subscore is 6.  Cranial Nerves: No dysarthria or facial asymmetry.     Sensory: Sensory deficit present.     Motor: Weakness present. No pronator drift.     Coordination: Coordination normal. Heel to Shin Test abnormal. Finger-Nose-Finger Test normal.     Comments: Slightly diminished right grip strength, no pronator drift. Reports altered sensation to diffuse right side of body. Slight difficulty with heel to shin with right leg.   Psychiatric:         Behavior: Behavior normal.    ED Results / Procedures / Treatments   Labs (all labs ordered are listed, but only abnormal results are displayed) Labs Reviewed  CBC WITH DIFFERENTIAL/PLATELET - Abnormal; Notable for the following components:      Result Value   WBC 11.1 (*)    All other components within normal limits  COMPREHENSIVE METABOLIC PANEL - Abnormal; Notable for the following components:   Potassium 3.2 (*)    Glucose, Bld 110 (*)    All other components within normal limits  CBG MONITORING, ED - Abnormal; Notable for the following components:   Glucose-Capillary 153 (*)    All other components within normal limits  URINALYSIS, ROUTINE W REFLEX MICROSCOPIC    EKG EKG Interpretation  Date/Time:  Friday July 16 2021 13:29:25 EST Ventricular Rate:  99 PR Interval:  172 QRS Duration: 83 QT Interval:  364 QTC Calculation: 468 R Axis:   -27 Text Interpretation: Sinus rhythm Probable left atrial enlargement Borderline left axis deviation Low voltage, precordial leads Probable anteroseptal infarct, old No significant change since prior 12/22 Confirmed by Aletta Edouard [phone] on 07/16/2021 1:31:14 PM  Radiology CT Head Wo Contrast  Result Date: 07/16/2021 CLINICAL DATA:  Neuro deficit, acute, stroke suspected. Right-sided weakness and numbness today. EXAM: CT HEAD WITHOUT CONTRAST TECHNIQUE: Contiguous axial images were obtained from the base of the skull through the vertex without intravenous contrast. COMPARISON:  CT and MRI head 06/18/2021. FINDINGS: Brain: There is no evidence of acute intracranial hemorrhage, mass lesion, brain edema or extra-axial fluid collection. The ventricles and subarachnoid spaces are appropriately sized for age. There is no CT evidence of acute cortical infarction. Vascular: Intracranial vascular calcifications. No hyperdense vessel identified. Skull: Negative for fracture or focal lesion. Sinuses/Orbits: Stable mild ethmoid sinus mucosal  thickening without air-fluid levels. The additional visualized paranasal sinuses, mastoid air cells and middle ears are clear. Previous lens extraction bilaterally. Other: None. IMPRESSION: Stable head CT. No evidence of acute intracranial hemorrhage or acute stroke. Electronically Signed   By: Richardean Sale M.D.   On: 07/16/2021 14:17    Procedures Procedures   Medications Ordered in ED Medications - No data to display  ED Course  I have reviewed the triage vital signs and the nursing notes.  Pertinent labs & imaging results that were available during my care of the patient were reviewed by me and considered in my medical decision making (see chart for details).    MDM Rules/Calculators/A&P                         This patient presents to the ED for concern of entire right side body with tingling, this involves an extensive number of treatment options, and is a complaint that carries with it a high risk of complications and morbidity.  The differential diagnosis includes but not limited to TIA, CVA   Co morbidities that complicate the patient evaluation  Hypertension, hyperlipidemia, diabetes   Additional history obtained:  Additional  history obtained from its External records from outside source obtained and reviewed including 06/29/21 visit with Dr. April Manson with [facility].    Lab Tests:  I Ordered, and personally interpreted labs.  The pertinent results include: CBC, CMP, glucose, urinalysis   Imaging Studies ordered:  I ordered imaging studies including CT head I independently visualized and interpreted imaging which showed no acute findings I agree with the radiologist interpretation   Test Considered:  MRI however recent imaging reviewed, not felt to be indicated emergently.   Critical Interventions:  None   Consultations Obtained:  Case discussed with Dr. Pearline Cables, ER attending, imaging, neuro notes, recent ER records reviewed.    Problem List /  ED Course:  Paresthesia    Reevaluation:  After the interventions noted above, I reevaluated the patient and found that they have :stayed the same   Dispostion:  After consideration of the diagnostic results and the patients response to treatment, I feel that the patent would benefit from follow-up with her neurologist return to emergency room for worsening or concerning symptoms..      Final Clinical Impression(s) / ED Diagnoses Final diagnoses:  Paresthesia    Rx / DC Orders ED Discharge Orders     None        Tacy Learn, PA-C 07/16/21 [place], [place], DO 07/17/21 [id number]

## 2021-07-16 NOTE — ED Triage Notes (Signed)
Reports sudden onset of right arm and leg numbness that started yesterday around 10 am.  Felt fine prior to that.  Has had several episodes of the same prior to this.  Has not found any cause for symptoms.  Alert and oriented in triage.

## 2021-07-20 ENCOUNTER — Ambulatory Visit (INDEPENDENT_AMBULATORY_CARE_PROVIDER_SITE_OTHER): Payer: Medicare Other | Admitting: Internal Medicine

## 2021-07-20 ENCOUNTER — Encounter: Payer: Self-pay | Admitting: Internal Medicine

## 2021-07-20 VITALS — BP 116/76 | HR 89 | Temp 98.1°F | Resp 18 | Ht 62.0 in | Wt 207.5 lb

## 2021-07-20 DIAGNOSIS — R29898 Other symptoms and signs involving the musculoskeletal system: Secondary | ICD-10-CM

## 2021-07-20 DIAGNOSIS — E876 Hypokalemia: Secondary | ICD-10-CM

## 2021-07-20 DIAGNOSIS — R202 Paresthesia of skin: Secondary | ICD-10-CM

## 2021-07-20 MED ORDER — POTASSIUM CHLORIDE CRYS ER 10 MEQ PO TBCR: 10.0000 meq | EXTENDED_RELEASE_TABLET | Freq: Every day | ORAL | 0 refills | Status: DC

## 2021-07-20 NOTE — Progress Notes (Signed)
Subjective:    Patient ID: Monica Wallace, female    DOB: Apr 07, 1951, 71 y.o.   MRN: [id number]  DOS:  07/20/2021 Type of visit - description: Acute, like to discuss multiple symptoms  In the last few months, she has experienced multiple symptoms: Dizziness, right arm weakness, leg weakness, difficulty ambulating,  speech abnormalities .  Has been in the ER at least 3 times, last visit 07/16/2021.  The reason for the visit was 1 day history of numbness at the R arm, R leg, R trunk, and minimal @  R face. No associated dizziness, headache, chest pain. No palpitations No diplopia, no dysphagia, speech was normal. Today 07/20/2021 she is better although she still feels some numbness on the right side of her body.      Prior to this last episode, extensive work-up has been done.  Last visit with neurology 06/29/2021: Excerpts from his note: She had extensive work-up including 2 MRIs brain, MRA head and neck, MRI cervical spine and echocardiogram of her heart, all within normal limits.  Her other testing including TSH was normal and a hemoglobin A1c was at  6.4.  During this time also she has followed up with her primary cardiologist and has some of her blood pressure medications changed but she still experiences these episodes, denies falls.  Last visit with cardiology 05/26/2021 Excerpts:  presyncope-etiology unclear.  However first 2 episodes sound potentially orthostatic mediated.  I will arrange an echocardiogram to assess LV function.  I will discontinue her Maxide as her systolic blood pressure is 114 today and this could contribute to orthostasis.  I asked her to stay hydrated as well.  If she has more frequent episodes in the future can consider monitor Echo: Normal LV function.  Most recent labs from few days ago: Potassium 3.2, creatinine normal, blood sugar 110, LFTs normal, hemoglobin 13.5.   Review of Systems At this point, again she has some remaining numbness on the right  side. She admits to some stress but nothing unusual. 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 Yesterday did have a fall but it was a mechanical one without major consequences.  Past Medical History:  Diagnosis Date   Arthritis    Asthma    Baker's cyst of knee, right 02/16/2017   Cancer ([place]) 2002   thyroid (2002)   Cystic breast    Diabetes ([place])    GERD (gastroesophageal reflux disease)    Hepatic cyst 02/02/2015   Hyperlipidemia    Hypertension    Insomnia 07/23/2016   Menopause    Migraine    Palpitations    Preventative health care 01/18/2016   Sleep apnea 02/02/2015   Thyroid disease     Past Surgical History:  Procedure Laterality Date   ABDOMINAL HYSTERECTOMY     took right ovary and uterus   APPENDECTOMY     CARPAL TUNNEL RELEASE Right    CESAREAN SECTION     CHOLECYSTECTOMY     EYE SURGERY Bilateral 2017   cataracts   KNEE ARTHROPLASTY     SHOULDER ARTHROSCOPY W/ ROTATOR CUFF REPAIR Left    THYROIDECTOMY  2002    Allergies as of 07/20/2021       Reactions   Levaquin [levofloxacin] Shortness Of Breath   Clindamycin/lincomycin    Pruritus No rash   Gabapentin Itching   Penicillins    Positive on allergy testing 'years ago.' Has never had penicillin. Has tolerated amoxicillin.   Codeine Itching  Medication List        Accurate as of July 20, 2021  4:32 PM. If you have any questions, ask your nurse or doctor.          acetaminophen 325 MG tablet Commonly known as: TYLENOL Take 650 mg by mouth every 6 (six) hours as needed.   albuterol 108 (90 Base) MCG/ACT inhaler Commonly known as: VENTOLIN HFA INHALE 2 PUFFS INTO THE LUNGS EVERY 6 HOURS AS NEEDED FOR WHEEZING OR SHORTNESS OF BREATH   Azelastine HCl 137 MCG/SPRAY Soln Place 1 spray in each nostril twice a day as needed for runny nose/drainage down throat What changed:  how much to take how to take this when to take this additional instructions   CVS VIT D 5000  HIGH-POTENCY PO Take 5,000 Units by mouth daily.   estradiol 0.1 MG/GM vaginal cream Commonly known as: ESTRACE VAGINAL Place 1 Applicatorful vaginally at bedtime.   fluticasone 50 MCG/ACT nasal spray Commonly known as: FLONASE SHAKE LIQUID AND USE 2 SPRAYS IN EACH NOSTRIL DAILY   furosemide 20 MG tablet Commonly known as: LASIX Take 1 tablet (20 mg total) by mouth daily.   hyoscyamine 0.125 MG SL tablet Commonly known as: LEVSIN SL Place 1 tablet (0.125 mg total) under the tongue every 4 (four) hours as needed.   loratadine 10 MG tablet Commonly known as: CLARITIN Take 10 mg by mouth in the morning and at bedtime.   montelukast 10 MG tablet Commonly known as: SINGULAIR TAKE 1 TABLET(10 MG) BY MOUTH DAILY AS NEEDED What changed:  how much to take how to take this when to take this additional instructions   Omega-3 1000 MG Caps Take 1,000 mg by mouth daily.   omeprazole 40 MG capsule Commonly known as: PRILOSEC Take 1 capsule by mouth daily.   OneTouch Delica Plus [id number] Misc Use to check blood glucose once a day.  DX code: E11.9   OneTouch Verio Flex System w/Device Kit Use to check blood glucose once a day.  DX code: E11.9   OneTouch Verio test strip Generic drug: glucose blood Use to check blood glucose once a day.  DX code: E11.9   Pulmicort Flexhaler 180 MCG/ACT inhaler Generic drug: budesonide Inhale 1-2 puffs into the lungs 2 (two) times daily.   rosuvastatin 5 MG tablet Commonly known as: CRESTOR TAKE 1 TABLET BY MOUTH 2 TIMES A WEEK(TUESDAYS AND SUNDAYS WEEKLY)   Synthroid 150 MCG tablet Generic drug: levothyroxine TAKE 1 TABLET(150 MCG) BY MOUTH DAILY BEFORE AND BREAKFAST What changed: See the new instructions.           Objective:   Physical Exam BP 116/76 (BP Location: Left Arm, Patient Position: Sitting, Cuff Size: Small)    Pulse 89    Temp 98.1 F (36.7 C) (Oral)    Resp 18    Ht 5' 2"  (1.575 m)    Wt 207 lb 8 oz (94.1 kg)    SpO2  97%    BMI 37.95 kg/m  General:   Well developed, NAD, BMI noted. HEENT:  Normocephalic . Face symmetric, atraumatic Lungs:  CTA B Normal respiratory effort, no intercostal retractions, no accessory muscle use. Heart: RRR,  no murmur.  Lower extremities: no pretibial edema bilaterally  Skin: Not pale. Not jaundice Neurologic:  alert & oriented X3.  Speech normal, gait appropriate for age.  Needed help transferring to the top of the examining table. Motor: Face symmetric, tongue midline, minimal if any weakness at the proximal R  leg. DTRs: Symmetrically reduced but present. Psych--  Cognition and judgment appear intact.  Cooperative with normal attention span and concentration.  Behavior appropriate. No anxious or depressed appearing.      Assessment    71 year old female, PMH includes asthma, hypertension, thyroid disease, DM, presents with:  Episodic weakness: As described above, extensive work-up in the last few months. Saw neurology 07/01/2021.  Note reviewed, they were considering referral to one of the neuromuscular superspecialist neurologist.  B12 was checked and normal. I do not seen the need of any emergent intervention today. She has an appointment to see neurology tomorrow, she plans to keep it.  I wonder about the atypical presentation of a primary muscular disease (MG?  Others?) I will send a message to cardiology, they were considering a monitor. Hypokalemia: Last potassium is slightly low, on Lasix, KCl 10 mEq x 10 today, then check a BMP RTC to see PCP as a scheduled   Time spent 30 minutes, extensive chart review, extensive listening therapy.  Patient is very concerned about the situation.  This visit occurred during the SARS-CoV-2 public health emergency.  Safety protocols were in place, including screening questions prior to the visit, additional usage of staff PPE, and extensive cleaning of exam room while observing appropriate contact time as indicated for  disinfecting solutions.

## 2021-07-20 NOTE — Patient Instructions (Signed)
Your potassium is a slightly low.  Take KCl 10 mEq 1 tablet daily for 10 days  Stop by the front and arrange blood work to be done in 2 weeks.  Keep the appointment to see your neurologist tomorrow

## 2021-07-21 ENCOUNTER — Ambulatory Visit (INDEPENDENT_AMBULATORY_CARE_PROVIDER_SITE_OTHER): Payer: Medicare Other | Admitting: Neurology

## 2021-07-21 ENCOUNTER — Encounter: Payer: Self-pay | Admitting: Neurology

## 2021-07-21 ENCOUNTER — Telehealth: Payer: Self-pay | Admitting: *Deleted

## 2021-07-21 VITALS — BP 155/81 | HR 74 | Ht 62.0 in | Wt 202.0 lb

## 2021-07-21 DIAGNOSIS — M5412 Radiculopathy, cervical region: Secondary | ICD-10-CM

## 2021-07-21 NOTE — Chronic Care Management (AMB) (Signed)
°  Care Management   Note  07/21/2021 Name: Monica Wallace MRN: [id number] DOB: Jan 09, 1951  Monica Wallace is a 71 y.o. year old female who is a primary care patient of [name], MD and is actively engaged with the care management team. I reached out to Clemens Catholic by phone today to assist with re-scheduling a follow up visit with the RN Case Manager  Follow up plan: Pt has had multiple issues recently and requests call back in 2 weeks after she has had testing done to reschedule with [facility]   Julian Hy, [facility]  Direct Dial: [phone]

## 2021-07-21 NOTE — Progress Notes (Signed)
[facility]  PATIENT: Monica Wallace DOB: 1951-07-09  REQUESTING CLINICIAN: [name], MD HISTORY FROM: Patient REASON FOR VISIT: Right arm and leg weakness/numbness    HISTORICAL  CHIEF COMPLAINT:  Chief Complaint  Patient presents with   Follow-up    Rm 14. NX [facility]/ED f/u for weakness/numbness Pt c/o right sided weakness and numbness in arm and leg.    INTERVAL HISTORY 07/21/2021:  Patient present today for follow-up, she presented to the ED on December 30 for right-sided weakness and numbness.  Head CT was done at that time and was negative for any acute stroke.  Patient reports that she continued to have numbness in the right side including arm and leg, face not involved.  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  She also complained neck pain right shoulder pain and there is also shooting pain coming down the right arm.     HISTORY OF PRESENT ILLNESS:  This is a 71 year old woman with past medical history of asthma, hypertension, thyroid disease and diabetes mellitus type 2 who is presenting with recurrent episodes of dizziness associated with lower extremities weakness.  Patient reports a total of 4 episodes of lightheadedness associated with weakness of both legs since the summer, the last episode was on December 2 and what was different is that it was associated with right arm heaviness.  Patient said on on that morning she woke up to reach for her alarm clock and noted that her right arm fell very heavy then she got up to use the bathroom and had difficulty walking due to weakness of the lower extremities, therefore she called for help and was taken to the ED.  On arrival at the ED her weakness improved but she still reports ongoing brain fog.  She had extensive work-up including 2 MRIs brain, MRA head and neck, MRI cervical spine and echocardiogram of her heart, all within normal limits.  Her  other testing including TSH was normal and a hemoglobin A1c was at  6.4.  During this time also she has followed up with her primary cardiologist and has some of her blood pressure medications changed but she still experiences these episodes, denies falls.    OTHER MEDICAL CONDITIONS: [place], HTN, Thyroid disease, DM   REVIEW OF SYSTEMS: Full 14 system review of systems performed and negative with exception of: as noted in the HPI  ALLERGIES: Allergies  Allergen Reactions   Levaquin [Levofloxacin] Shortness Of Breath   Clindamycin/Lincomycin     Pruritus No rash   Gabapentin Itching   Penicillins     Positive on allergy testing 'years ago.' Has never had penicillin. Has tolerated amoxicillin.   Codeine Itching    HOME MEDICATIONS: Outpatient Medications Prior to Visit  Medication Sig Dispense Refill   acetaminophen (TYLENOL) 325 MG tablet Take 650 mg by mouth every 6 (six) hours as needed.     albuterol (VENTOLIN HFA) 108 (90 Base) MCG/ACT inhaler INHALE 2 PUFFS INTO THE LUNGS EVERY 6 HOURS AS NEEDED FOR WHEEZING OR SHORTNESS OF BREATH 25.5 g 1   azelastine (ASTELIN) 0.1 % nasal spray Place 1 spray in each nostril twice a day as needed for runny nose/drainage down throat (Patient taking differently: Place 1 spray into both nostrils daily.) 90 mL 1   Blood Glucose Monitoring Suppl (ONETOUCH VERIO FLEX SYSTEM) w/Device KIT Use to check blood glucose once a day.  DX code: E11.9 1 kit 0  budesonide (PULMICORT) 180 MCG/ACT inhaler Inhale 1-2 puffs into the lungs 2 (two) times daily. 3 each 1   Cholecalciferol ([employer]S VIT D 5000 HIGH-POTENCY PO) Take 5,000 Units by mouth daily.     estradiol (ESTRACE VAGINAL) 0.1 MG/GM vaginal cream Place 1 Applicatorful vaginally at bedtime. 42.5 g 12   fluticasone (FLONASE) 50 MCG/ACT nasal spray SHAKE LIQUID AND USE 2 SPRAYS IN EACH NOSTRIL DAILY 48 g 1   furosemide (LASIX) 20 MG tablet Take 1 tablet (20 mg total) by mouth daily. 90 tablet 3   glucose  blood (ONETOUCH VERIO) test strip Use to check blood glucose once a day.  DX code: E11.9 100 each 1   hyoscyamine (LEVSIN SL) 0.125 MG SL tablet Place 1 tablet (0.125 mg total) under the tongue every 4 (four) hours as needed. 30 tablet 1   Lancets (ONETOUCH DELICA PLUS OMVEHM09O) MISC Use to check blood glucose once a day.  DX code: E11.9 100 each 1   loratadine (CLARITIN) 10 MG tablet Take 10 mg by mouth in the morning and at bedtime.      montelukast (SINGULAIR) 10 MG tablet TAKE 1 TABLET(10 MG) BY MOUTH DAILY AS NEEDED (Patient taking differently: Take 10 mg by mouth at bedtime.) 90 tablet 1   Omega-3 1000 MG CAPS Take 1,000 mg by mouth daily.     omeprazole (PRILOSEC) 40 MG capsule Take 1 capsule by mouth daily.     potassium chloride (KLOR-CON M) 10 MEQ tablet Take 1 tablet (10 mEq total) by mouth daily. 10 tablet 0   rosuvastatin (CRESTOR) 5 MG tablet TAKE 1 TABLET BY MOUTH 2 TIMES A WEEK(TUESDAYS AND SUNDAYS WEEKLY) 25 tablet 1   SYNTHROID 150 MCG tablet TAKE 1 TABLET(150 MCG) BY MOUTH DAILY BEFORE AND BREAKFAST (Patient taking differently: Take 150 mcg by mouth daily before breakfast.) 90 tablet 1   UNABLE TO FIND Take 1 capsule by mouth daily. Med Name: potassium 10 meq     No facility-administered medications prior to visit.    PAST MEDICAL HISTORY: Past Medical History:  Diagnosis Date   Arthritis    Asthma    Baker's cyst of knee, right 02/16/2017   Cancer ([place]) 2002   thyroid (2002)   Cystic breast    Diabetes ([place])    GERD (gastroesophageal reflux disease)    Hepatic cyst 02/02/2015   Hyperlipidemia    Hypertension    Insomnia 07/23/2016   Menopause    Migraine    Palpitations    Preventative health care 01/18/2016   Sleep apnea 02/02/2015   Thyroid disease     PAST SURGICAL HISTORY: Past Surgical History:  Procedure Laterality Date   ABDOMINAL HYSTERECTOMY     took right ovary and uterus   APPENDECTOMY     CARPAL TUNNEL RELEASE Right    CESAREAN SECTION      CHOLECYSTECTOMY     EYE SURGERY Bilateral 2017   cataracts   KNEE ARTHROPLASTY     SHOULDER ARTHROSCOPY W/ ROTATOR CUFF REPAIR Left    THYROIDECTOMY  2002    FAMILY HISTORY: Family History  Problem Relation Age of Onset   Arthritis Mother    Transient ischemic attack Mother    Hypertension Mother    Hyperlipidemia Mother    Diabetes Father    Heart disease Father    Arthritis Father    Kidney disease Father    Hypertension Father    Hyperlipidemia Father    Cancer Sister 22  pancreatic   Fibromyalgia Sister    Allergic rhinitis Sister    Asthma Maternal Grandfather        smoker   Depression Maternal Grandfather    Dementia Paternal Grandmother    Diabetes Paternal Grandfather    Hypertension Brother    Allergic rhinitis Brother    Scoliosis Daughter        had rods placed and removed   Arthritis Sister    Gout Sister    GER disease Sister    Cancer Sister        breast cancer   Breast cancer Sister 51    SOCIAL HISTORY: Social History   Socioeconomic History   Marital status: Divorced    Spouse name: Not on file   Number of children: 2   Years of education: Not on file   Highest education level: Not on file  Occupational History   Not on file  Tobacco Use   Smoking status: Never   Smokeless tobacco: Never  Vaping Use   Vaping Use: Never used  Substance and Sexual Activity   Alcohol use: Yes    Alcohol/week: 0.0 standard drinks    Comment: very rare once or twice a year   Drug use: No   Sexual activity: Yes    Birth control/protection: Other-see comments, Surgical    Comment:  boyfriend  Other Topics Concern   Not on file  Social History Narrative   Household: pt, daughter and sister    Social Determinants of Health   Financial Resource Strain: Low Risk    Difficulty of Paying Living Expenses: Not hard at all  Food Insecurity: No Food Insecurity   Worried About Charity fundraiser in the Last Year: Never true   Arboriculturist in  the Last Year: Never true  Transportation Needs: No Transportation Needs   Lack of Transportation (Medical): No   Lack of Transportation (Non-Medical): No  Physical Activity: Inactive   Days of Exercise per Week: 0 days   Minutes of Exercise per Session: 0 min  Stress: No Stress Concern Present   Feeling of Stress : Not at all  Social Connections: Moderately Isolated   Frequency of Communication with Friends and Family: More than three times a week   Frequency of Social Gatherings with Friends and Family: More than three times a week   Attends Religious Services: More than 4 times per year   Active Member of [employer] or Organizations: No   Attends Archivist Meetings: Never   Marital Status: Divorced  Human resources officer Violence: Not At Risk   Fear of Current or Ex-Partner: No   Emotionally Abused: No   Physically Abused: No   Sexually Abused: No    PHYSICAL EXAM  GENERAL EXAM/CONSTITUTIONAL: Vitals:  Vitals:   07/21/21 0912  BP: (!) 155/81  Pulse: 74  Weight: 202 lb (91.6 kg)  Height: 5' 2"  (1.575 m)   Body mass index is 36.95 kg/m. Wt Readings from Last 3 Encounters:  07/21/21 202 lb (91.6 kg)  07/20/21 207 lb 8 oz (94.1 kg)  07/16/21 202 lb 9.6 oz (91.9 kg)   Patient is in no distress; well developed, nourished and groomed; neck is supple  CARDIOVASCULAR: Examination of carotid arteries is normal; no carotid bruits Regular rate and rhythm, no murmurs Examination of peripheral vascular system by observation and palpation is normal  EYES: Pupils round and reactive to light, Visual fields full to confrontation, Extraocular movements intacts,   MUSCULOSKELETAL:  Gait, strength, tone, movements noted in Neurologic exam below  NEUROLOGIC: MENTAL STATUS:  MMSE - Mini Mental State Exam 07/21/2016  Orientation to time 5  Orientation to Place 5  Registration 3  Attention/ Calculation 5  Recall 3  Language- name 2 objects 2  Language- repeat 1  Language-  follow 3 step command 3  Language- read & follow direction 1  Write a sentence 1  Copy design 1  Total score 30   awake, alert, oriented to person, place and time recent and remote memory intact normal attention and concentration language fluent, comprehension intact, naming intact fund of knowledge appropriate  CRANIAL NERVE:  2nd, 3rd, 4th, 6th - pupils equal and reactive to light, visual fields full to confrontation, extraocular muscles intact, no nystagmus 5th - facial sensation symmetric 7th - facial strength symmetric 8th - hearing intact 9th - palate elevates symmetrically, uvula midline 11th - shoulder shrug symmetric 12th - tongue protrusion midline  MOTOR:  normal bulk and tone, decrease strength in right shoulder abduction, hip flexion and knee flexion on the right 4 to 4+/5 when compared to the left which is full 5/5.  SENSORY:  normal and symmetric to light touch, pinprick  COORDINATION:  finger-nose-finger, fine finger movements normal  REFLEXES:  deep tendon reflexes present and symmetric  GAIT/STATION:  normal    DIAGNOSTIC DATA (LABS, IMAGING, TESTING) - I reviewed patient records, labs, notes, testing and imaging myself where available.  Lab Results  Component Value Date   WBC 11.1 (H) 07/16/2021   HGB 13.5 07/16/2021   HCT 39.9 07/16/2021   MCV 88.9 07/16/2021   PLT 319 07/16/2021      Component Value Date/Time   NA 136 07/16/2021 1428   NA 139 06/17/2021 1617   K 3.2 (L) 07/16/2021 1428   CL 99 07/16/2021 1428   CO2 28 07/16/2021 1428   GLUCOSE 110 (H) 07/16/2021 1428   BUN 16 07/16/2021 1428   BUN 15 06/17/2021 1617   CREATININE 0.84 07/16/2021 1428   CREATININE 0.93 03/05/2021 1519   CALCIUM 9.6 07/16/2021 1428   PROT 7.2 07/16/2021 1428   PROT 6.9 11/30/2020 1643   ALBUMIN 3.8 07/16/2021 1428   ALBUMIN 4.1 11/30/2020 1643   AST 25 07/16/2021 1428   ALT 21 07/16/2021 1428   ALKPHOS 75 07/16/2021 1428   BILITOT 0.3 07/16/2021  1428   BILITOT <0.2 11/30/2020 1643   GFRNONAA >60 07/16/2021 1428   GFRAA >60 01/16/2020 0404   Lab Results  Component Value Date   CHOL 199 03/02/2021   HDL 52.10 03/02/2021   LDLCALC 114 (H) 03/02/2021   LDLDIRECT 86.0 10/29/2020   TRIG 164.0 (H) 03/02/2021   CHOLHDL 4 03/02/2021   Lab Results  Component Value Date   HGBA1C 6.4 03/02/2021   Lab Results  Component Value Date   [id number] 612 06/29/2021   Lab Results  Component Value Date   TSH 0.69 06/08/2021    MRI Brain 06/18/2021 No evidence of recent infarction, hemorrhage, or mass. No significant change since prior study   MRI Brain, MRA head and neck 05/19/2021: 1. Normal MRI of the brain. 2. Normal MRA of the head and neck   MRI Cervical spine 02/22/2020 1. Moderate right C3-4 and C4-5 neural foraminal stenosis. 2. Moderate left C5-6 neural foraminal stenosis. 3. No spinal canal stenosis.    ASSESSMENT AND PLAN  71 y.o. year old female with diabetes mellitus type 2, hypothyroidism, asthma and hypertension who is presenting for follow  up after a recent presentation to the ED for right side weakness and numbness. She previously reported a total of 4 episodes of dizziness associated with bilateral lower extremities weakness/heaviness.  There are no falls associated with these events.  She has had extensive work-up including MRI Brain and neck, MRAs Head and neck and echocardiogram without clear etiology.  Labs within normal limits. 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 I will contact the patient to go over the MRI result otherwise I will see her in 3 months.  I informed the patient if the MRI is abnormal, will consider referral to neurosurgery, if no etiology of her symptoms found,  then will consider referral to my neuromuscular colleague     1. Cervical radicular pain     Patient Instructions  Cervical spine MRI  Continue with Physical Therapy  Will contact you to go over the MRI results  Return in 3 months or sooner if worse     Orders Placed This Encounter  Procedures   MR CERVICAL SPINE WO CONTRAST    No orders of the defined types were placed in this encounter.    Return in about 3 months (around 10/19/2021).    Alric Ran, MD 07/21/2021, 10:00 AM  [facility] [address] [place],  [address] [phone]

## 2021-07-21 NOTE — Patient Instructions (Addendum)
Cervical spine MRI  Continue with Physical Therapy  Will contact you to go over the MRI results  Return in 3 months or sooner if worse

## 2021-07-22 ENCOUNTER — Telehealth: Payer: Self-pay | Admitting: *Deleted

## 2021-07-22 ENCOUNTER — Encounter: Payer: Self-pay | Admitting: *Deleted

## 2021-07-22 NOTE — Telephone Encounter (Signed)
Pt needs a return to work note email to her today. [email or web address]

## 2021-07-22 NOTE — Telephone Encounter (Addendum)
Per vo by Dr. April Manson, he will provide note to return to work. Only restriction will be no lifting over 25 pounds until cervical MRI results having been determined.   I spoke to the patient. She is fine with this restriction. States her job does not require this type of heavy lifting.   She is agreeable for this letter to be sent to her mychart account.

## 2021-07-30 ENCOUNTER — Other Ambulatory Visit (HOSPITAL_BASED_OUTPATIENT_CLINIC_OR_DEPARTMENT_OTHER): Payer: Self-pay

## 2021-07-31 ENCOUNTER — Other Ambulatory Visit: Payer: Self-pay

## 2021-07-31 ENCOUNTER — Ambulatory Visit (HOSPITAL_BASED_OUTPATIENT_CLINIC_OR_DEPARTMENT_OTHER)
Admission: RE | Admit: 2021-07-31 | Discharge: 2021-07-31 | Disposition: A | Discharge status: Home / Self Care | Payer: Medicare Other | Source: Ambulatory Visit | Attending: Neurology | Admitting: Neurology

## 2021-07-31 DIAGNOSIS — M5412 Radiculopathy, cervical region: Secondary | ICD-10-CM | POA: Insufficient documentation

## 2021-07-31 DIAGNOSIS — M542 Cervicalgia: Secondary | ICD-10-CM | POA: Diagnosis not present

## 2021-08-02 ENCOUNTER — Telehealth: Payer: Self-pay | Admitting: Neurology

## 2021-08-02 NOTE — Telephone Encounter (Signed)
Contacted patient, discussed recent cervical spine MRI showing a moderate right C3-4 and mild right C4-5 and left C6-7 neuroforaminal central stenosis.  There was no spinal canal stenosis.  She continues to report numbness on the right-hand side.  She is currently doing physical therapy which I strongly recommend to continue but she still experiences the numbness.  She has an appointment with her primary care doctor in a month at that time they will discuss about referral to neurosurgery.  I will see her as scheduled.

## 2021-08-03 ENCOUNTER — Other Ambulatory Visit (INDEPENDENT_AMBULATORY_CARE_PROVIDER_SITE_OTHER): Payer: Medicare Other

## 2021-08-03 DIAGNOSIS — E876 Hypokalemia: Secondary | ICD-10-CM

## 2021-08-04 DIAGNOSIS — J343 Hypertrophy of nasal turbinates: Secondary | ICD-10-CM | POA: Diagnosis not present

## 2021-08-04 DIAGNOSIS — J320 Chronic maxillary sinusitis: Secondary | ICD-10-CM | POA: Diagnosis not present

## 2021-08-04 DIAGNOSIS — J321 Chronic frontal sinusitis: Secondary | ICD-10-CM | POA: Diagnosis not present

## 2021-08-04 LAB — BASIC METABOLIC PANEL
BUN: 18 mg/dL (ref 6–23)
CO2: 29 mEq/L (ref 19–32)
Calcium: 9.6 mg/dL (ref 8.4–10.5)
Chloride: 98 mEq/L (ref 96–112)
Creatinine, Ser: 0.89 mg/dL (ref 0.40–1.20)
GFR: 64.76 mL/min (ref >60.00)
Glucose, Bld: 115 mg/dL — ABNORMAL HIGH (ref 70–99)
Potassium: 3.7 mEq/L (ref 3.5–5.1)
Sodium: 138 mEq/L (ref 135–145)

## 2021-08-16 ENCOUNTER — Encounter: Payer: Self-pay | Admitting: Pulmonary Disease

## 2021-08-16 ENCOUNTER — Other Ambulatory Visit: Payer: Self-pay

## 2021-08-16 ENCOUNTER — Ambulatory Visit (INDEPENDENT_AMBULATORY_CARE_PROVIDER_SITE_OTHER): Payer: Medicare Other | Admitting: Pulmonary Disease

## 2021-08-16 VITALS — BP 118/76 | HR 71 | Temp 98.7°F | Ht 62.0 in | Wt 205.0 lb

## 2021-08-16 DIAGNOSIS — G4733 Obstructive sleep apnea (adult) (pediatric): Secondary | ICD-10-CM

## 2021-08-16 NOTE — Progress Notes (Signed)
Monica Wallace    [id number]    September 09, 1950  Primary Care Physician:Blyth, Bonnita Levan, MD  Referring Physician: [name], MD [address] [place],  [place] [address]  Chief complaint:   Patient being evaluated for obstructive sleep apnea  HPI:  She was diagnosed with obstructive sleep apnea in 2016, used CPAP for a while, at some point was transitioned to BiPAP and subsequently got off pressure therapy  She is here because of primary care doctor was concerned about her still having significant sleep apnea  She describes episodes of some weakness of her arms in the past-work-up was negative with CTs, MRI  Admits to snoring, no witnessed apneas admits to dryness of her mouth in the mornings  Rare headaches Rare night sweats Memory is good  She does have hypertension, asthma, diabetes, hypercholesterolemia, sinus and allergy problems  Never smoker  Tries to go to sleep between 8 and 9 PM Takes a few minutes to fall asleep 1-2 awakenings Final wake up time about 430  Weight is recently down about 10 pounds   Outpatient Encounter Medications as of 08/16/2021  Medication Sig   acetaminophen (TYLENOL) 325 MG tablet Take 650 mg by mouth every 6 (six) hours as needed.   albuterol (VENTOLIN HFA) 108 (90 Base) MCG/ACT inhaler INHALE 2 PUFFS INTO THE LUNGS EVERY 6 HOURS AS NEEDED FOR WHEEZING OR SHORTNESS OF BREATH   azelastine (ASTELIN) 0.1 % nasal spray Place 1 spray in each nostril twice a day as needed for runny nose/drainage down throat (Patient taking differently: Place 1 spray into both nostrils daily.)   Blood Glucose Monitoring Suppl (ONETOUCH VERIO FLEX SYSTEM) w/Device KIT Use to check blood glucose once a day.  DX code: E11.9   budesonide (PULMICORT) 180 MCG/ACT inhaler Inhale 1-2 puffs into the lungs 2 (two) times daily.   Cholecalciferol ([employer]S VIT D 5000 HIGH-POTENCY PO) Take 5,000 Units by mouth daily.   estradiol (ESTRACE VAGINAL) 0.1 MG/GM  vaginal cream Place 1 Applicatorful vaginally at bedtime.   fluticasone (FLONASE) 50 MCG/ACT nasal spray SHAKE LIQUID AND USE 2 SPRAYS IN EACH NOSTRIL DAILY   furosemide (LASIX) 20 MG tablet Take 1 tablet (20 mg total) by mouth daily.   glucose blood (ONETOUCH VERIO) test strip Use to check blood glucose once a day.  DX code: E11.9   hyoscyamine (LEVSIN SL) 0.125 MG SL tablet Place 1 tablet (0.125 mg total) under the tongue every 4 (four) hours as needed.   Lancets (ONETOUCH DELICA PLUS [id number]) MISC Use to check blood glucose once a day.  DX code: E11.9   loratadine (CLARITIN) 10 MG tablet Take 10 mg by mouth in the morning and at bedtime.    montelukast (SINGULAIR) 10 MG tablet TAKE 1 TABLET(10 MG) BY MOUTH DAILY AS NEEDED (Patient taking differently: Take 10 mg by mouth at bedtime.)   Omega-3 1000 MG CAPS Take 1,000 mg by mouth daily.   omeprazole (PRILOSEC) 40 MG capsule Take 1 capsule by mouth daily.   rosuvastatin (CRESTOR) 5 MG tablet TAKE 1 TABLET BY MOUTH 2 TIMES A WEEK(TUESDAYS AND SUNDAYS WEEKLY)   SYNTHROID 150 MCG tablet TAKE 1 TABLET(150 MCG) BY MOUTH DAILY BEFORE AND BREAKFAST (Patient taking differently: Take 150 mcg by mouth daily before breakfast.)   [DISCONTINUED] potassium chloride (KLOR-CON M) 10 MEQ tablet Take 1 tablet (10 mEq total) by mouth daily.   [DISCONTINUED] UNABLE TO FIND Take 1 capsule by  mouth daily. Med Name: potassium 10 meq   No facility-administered encounter medications on file as of 08/16/2021.    Allergies as of 08/16/2021 - Review Complete 08/16/2021  Allergen Reaction Noted   Levaquin [levofloxacin] Shortness Of Breath 08/22/2013   Penicillins Other (See Comments) 08/22/2013   Clindamycin/lincomycin Other (See Comments) 11/20/2017   Codeine Itching 08/22/2013   Gabapentin Itching 09/02/2019    Past Medical History:  Diagnosis Date   Arthritis    Asthma    Baker's cyst of knee, right 02/16/2017   Cancer ([place]) 2002   thyroid (2002)   Cystic  breast    Diabetes ([place])    GERD (gastroesophageal reflux disease)    Hepatic cyst 02/02/2015   Hyperlipidemia    Hypertension    Insomnia 07/23/2016   Menopause    Migraine    Palpitations    Preventative health care 01/18/2016   Sleep apnea 02/02/2015   Thyroid disease     Past Surgical History:  Procedure Laterality Date   ABDOMINAL HYSTERECTOMY     took right ovary and uterus   APPENDECTOMY     CARPAL TUNNEL RELEASE Right    CESAREAN SECTION     CHOLECYSTECTOMY     EYE SURGERY Bilateral 2017   cataracts   KNEE ARTHROPLASTY     SHOULDER ARTHROSCOPY W/ ROTATOR CUFF REPAIR Left    THYROIDECTOMY  2002    Family History  Problem Relation Age of Onset   Arthritis Mother    Transient ischemic attack Mother    Hypertension Mother    Hyperlipidemia Mother    Diabetes Father    Heart disease Father    Arthritis Father    Kidney disease Father    Hypertension Father    Hyperlipidemia Father    Cancer Sister 21       pancreatic   Fibromyalgia Sister    Allergic rhinitis Sister    Asthma Maternal Grandfather        smoker   Depression Maternal Grandfather    Dementia Paternal Grandmother    Diabetes Paternal Grandfather    Hypertension Brother    Allergic rhinitis Brother    Scoliosis Daughter        had rods placed and removed   Arthritis Sister    Gout Sister    GER disease Sister    Cancer Sister        breast cancer   Breast cancer Sister 69    Social History   Socioeconomic History   Marital status: Divorced    Spouse name: Not on file   Number of children: 2   Years of education: Not on file   Highest education level: Not on file  Occupational History   Not on file  Tobacco Use   Smoking status: Never   Smokeless tobacco: Never  Vaping Use   Vaping Use: Never used  Substance and Sexual Activity   Alcohol use: Yes    Alcohol/week: 0.0 standard drinks    Comment: very rare once or twice a year   Drug use: No   Sexual activity: Yes     Birth control/protection: Other-see comments, Surgical    Comment:  boyfriend  Other Topics Concern   Not on file  Social History Narrative   Household: pt, daughter and sister    Social Determinants of Health   Financial Resource Strain: Low Risk    Difficulty of Paying Living Expenses: Not hard at all  Food Insecurity: No Food Insecurity  Worried About Charity fundraiser in the Last Year: Never true   [place] in the Last Year: Never true  Transportation Needs: No Transportation Needs   Lack of Transportation (Medical): No   Lack of Transportation (Non-Medical): No  Physical Activity: Inactive   Days of Exercise per Week: 0 days   Minutes of Exercise per Session: 0 min  Stress: No Stress Concern Present   Feeling of Stress : Not at all  Social Connections: Moderately Isolated   Frequency of Communication with Friends and Family: More than three times a week   Frequency of Social Gatherings with Friends and Family: More than three times a week   Attends Religious Services: More than 4 times per year   Active Member of [employer] or Organizations: No   Attends Archivist Meetings: Never   Marital Status: Divorced  Human resources officer Violence: Not At Risk   Fear of Current or Ex-Partner: No   Emotionally Abused: No   Physically Abused: No   Sexually Abused: No    Review of Systems  Vitals:   08/16/21 1539  BP: 118/76  Pulse: 71  Temp: 98.7 F (37.1 C)  SpO2: 97%     Physical Exam  Results of the [facility] flowsheet 08/16/2021 06/01/2016  Sitting and reading 0 1  Watching TV 0 1  Sitting, inactive in a public place (e.g. a theatre or a meeting) 0 0  As a passenger in a car for an hour without a break 2 1  Lying down to rest in the afternoon when circumstances permit 2 3  Sitting and talking to someone 0 0  Sitting quietly after a lunch without alcohol 0 1  In a car, while stopped for a few minutes in traffic 0 0  Total score 4 7    Data  Reviewed:  No downloads from previous compliance available  Previous study reviewed showing AHI of 5.2  Assessment:  Past history of mild obstructive sleep apnea -Did not tolerate CPAP well -At some point was tried on BiPAP as well  Most recent study did reveal AHI of 5.2  She still has some daytime sleepiness/fatigue  Snoring, apneas  There is the possibility of obstructive sleep apnea -Patient wants to be evaluated and then decide whether she wants to consider any treatment  Pathophysiology of sleep disordered breathing discussed with the patient  Plan/Recommendations: Schedule patient for home sleep study  We will bring her back in to discuss findings and then options of treatment  Encouraged to continue weight loss efforts  Tentative follow-up in 3 to 4 months   Sherrilyn Rist MD [facility] 08/16/2021, 4:09 PM  CC: [name], MD

## 2021-08-16 NOTE — Patient Instructions (Signed)
We will schedule you for home sleep study  We will bring you back to the office to discuss findings  Consider options of treatment  Continue weight loss efforts  Regular exercises encouraged  Sleep Apnea Sleep apnea affects breathing during sleep. It causes breathing to stop for 10 seconds or more, or to become shallow. People with sleep apnea usually snore loudly. It can also increase the risk of: Heart attack. Stroke. Being very overweight (obese). Diabetes. Heart failure. Irregular heartbeat. High blood pressure. The goal of treatment is to help you breathe normally again. What are the causes? The most common cause of this condition is a collapsed or blocked airway. There are three kinds of sleep apnea: Obstructive sleep apnea. This is caused by a blocked or collapsed airway. Central sleep apnea. This happens when the brain does not send the right signals to the muscles that control breathing. Mixed sleep apnea. This is a combination of obstructive and central sleep apnea. What increases the risk? Being overweight. Smoking. Having a small airway. Being older. Being female. Drinking alcohol. Taking medicines to calm yourself (sedatives or tranquilizers). Having family members with the condition. Having a tongue or tonsils that are larger than normal. What are the signs or symptoms? Trouble staying asleep. Loud snoring. Headaches in the morning. Waking up gasping. Dry mouth or sore throat in the morning. Being sleepy or tired during the day. If you are sleepy or tired during the day, you may also: Not be able to focus your mind (concentrate). Forget things. Get angry a lot and have mood swings. Feel sad (depressed). Have changes in your personality. Have less interest in sex, if you are female. Be unable to have an erection, if you are female. How is this treated?  Sleeping on your side. Using a medicine to get rid of mucus in your nose (decongestant). Avoiding  the use of alcohol, medicines to help you relax, or certain pain medicines (narcotics). Losing weight, if needed. Changing your diet. Quitting smoking. Using a machine to open your airway while you sleep, such as: An oral appliance. This is a mouthpiece that shifts your lower jaw forward. A CPAP device. This device blows air through a mask when you breathe out (exhale). An EPAP device. This has valves that you put in each nostril. A BIPAP device. This device blows air through a mask when you breathe in (inhale) and breathe out. Having surgery if other treatments do not work. Follow these instructions at home: Lifestyle Make changes that your doctor recommends. Eat a healthy diet. Lose weight if needed. Avoid alcohol, medicines to help you relax, and some pain medicines. Do not smoke or use any products that contain nicotine or tobacco. If you need help quitting, ask your doctor. General instructions Take over-the-counter and prescription medicines only as told by your doctor. If you were given a machine to use while you sleep, use it only as told by your doctor. If you are having surgery, make sure to tell your doctor you have sleep apnea. You may need to bring your device with you. Keep all follow-up visits. Contact a doctor if: The machine that you were given to use during sleep bothers you or does not seem to be working. You do not get better. You get worse. Get help right away if: Your chest hurts. You have trouble breathing in enough air. You have an uncomfortable feeling in your back, arms, or stomach. You have trouble talking. One side of your body feels weak.  A part of your face is hanging down. These symptoms may be an emergency. Get help right away. Call your local emergency services (911 in the [place] Do not wait to see if the symptoms will go away. Do not drive yourself to the hospital. Summary This condition affects breathing during sleep. The most common cause is a  collapsed or blocked airway. The goal of treatment is to help you breathe normally while you sleep. This information is not intended to replace advice given to you by your health care provider. Make sure you discuss any questions you have with your health care provider. Document Revised: 02/10/2021 Document Reviewed: 06/12/2020 Elsevier Patient Education  2022 [employer].

## 2021-08-18 ENCOUNTER — Other Ambulatory Visit: Payer: Self-pay | Admitting: Family Medicine

## 2021-08-18 ENCOUNTER — Telehealth: Payer: Self-pay

## 2021-08-18 DIAGNOSIS — R531 Weakness: Secondary | ICD-10-CM

## 2021-08-18 DIAGNOSIS — M542 Cervicalgia: Secondary | ICD-10-CM

## 2021-08-18 DIAGNOSIS — R200 Anesthesia of skin: Secondary | ICD-10-CM

## 2021-08-18 DIAGNOSIS — R29898 Other symptoms and signs involving the musculoskeletal system: Secondary | ICD-10-CM

## 2021-08-18 NOTE — Telephone Encounter (Signed)
-----   Message from [name], MD sent at 08/04/2021  2:57 PM EST ----- Regarding: RE: New Referral Let me know when the patient let's [place] know about her symptoms ----- Message ----- From: Franchot Gallo Sent: 08/04/2021   7:55 AM EST To: [name], MD Subject: RE: New Referral                               You're the best!  Thank you ----- Message ----- From: [name], MD Sent: 08/03/2021   5:40 PM EST To: Franchot Gallo, Duard Brady Kayle Correa, CMA Subject: RE: New Referral                               Sure, will have my CMA reach out to her and confirm what symptom she wants to return to PT to address and then place the order. Thanks Dr B ----- Message ----- From: Franchot Gallo Sent: 08/03/2021   4:23 PM EST To: [name], MD Subject: New Referral                                   Hi Dr. Charlett Blake,  Ms. Demello stopped by today and wants to come back to physical therapy. Can you please send [place] a new referral for her?  Thank you - Manuela Schwartz

## 2021-08-18 NOTE — Telephone Encounter (Signed)
Neck pain weakness and numbness to right side No stroke Neurologist suggested PT again

## 2021-08-21 ENCOUNTER — Other Ambulatory Visit: Payer: Self-pay | Admitting: Family Medicine

## 2021-08-24 NOTE — Chronic Care Management (AMB) (Signed)
°  Care Management   Note  08/24/2021 Name: JONTAVIA LEATHERBURY MRN: [id number] DOB: 1950/08/30  TERIN DIEROLF is a 71 y.o. year old female who is a primary care patient of [name], MD and is actively engaged with the care management team. I reached out to Clemens Catholic by phone today to assist with re-scheduling a follow up visit with the RN Case Manager  Follow up plan: Unsuccessful telephone outreach attempt made. A HIPAA compliant phone message was left for the patient providing contact information and requesting a return call.   Julian Hy, [place] Management  Direct Dial: [phone]

## 2021-08-30 NOTE — Chronic Care Management (AMB) (Signed)
°  Care Management   Note  08/30/2021 Name: [name] MRN: [id number] DOB: 20-Mar-1951  Monica Wallace is a 71 y.o. year old female who is a primary care patient of [name], MD and is actively engaged with the care management team. I reached out to Clemens Catholic by phone today to assist with re-scheduling a follow up visit with the RN Case Manager  Follow up plan: 2nd Unsuccessful telephone outreach attempt made. A HIPAA compliant phone message was left for the patient providing contact information and requesting a return call.   Julian Hy, [place] Management  Direct Dial: [phone]

## 2021-08-31 ENCOUNTER — Ambulatory Visit: Payer: Medicare Other | Attending: Family Medicine | Admitting: Physical Therapy

## 2021-08-31 ENCOUNTER — Other Ambulatory Visit: Payer: Self-pay

## 2021-08-31 ENCOUNTER — Encounter: Payer: Self-pay | Admitting: Physical Therapy

## 2021-08-31 DIAGNOSIS — M62838 Other muscle spasm: Secondary | ICD-10-CM

## 2021-08-31 DIAGNOSIS — G8929 Other chronic pain: Secondary | ICD-10-CM | POA: Insufficient documentation

## 2021-08-31 DIAGNOSIS — R293 Abnormal posture: Secondary | ICD-10-CM

## 2021-08-31 DIAGNOSIS — M6281 Muscle weakness (generalized): Secondary | ICD-10-CM

## 2021-08-31 DIAGNOSIS — R29898 Other symptoms and signs involving the musculoskeletal system: Secondary | ICD-10-CM | POA: Diagnosis not present

## 2021-08-31 DIAGNOSIS — M542 Cervicalgia: Secondary | ICD-10-CM | POA: Diagnosis not present

## 2021-08-31 NOTE — Therapy (Signed)
[facility] [address]  [place], [place], [address] Phone: [phone]   Fax:  [phone]  Physical Therapy Evaluation  Patient Details  Name: Monica Wallace MRN: [id number] Date of Birth: 21-Dec-1950 Referring Provider (PT): [name], MD   Encounter Date: 08/31/2021   PT End of Session - 08/31/21 1530     Visit Number 1    Number of Visits 12    Date for PT Re-Evaluation 10/12/21    Authorization Type Medicare & AARP    PT Start Time 9480    PT Stop Time 1618    PT Time Calculation (min) 48 min    Activity Tolerance Patient tolerated treatment well    Behavior During Therapy [facility] for tasks assessed/performed             Past Medical History:  Diagnosis Date   Arthritis    Asthma    Baker's cyst of knee, right 02/16/2017   Cancer ([place]) 2002   thyroid (2002)   Cystic breast    Diabetes ([place])    GERD (gastroesophageal reflux disease)    Hepatic cyst 02/02/2015   Hyperlipidemia    Hypertension    Insomnia 07/23/2016   Menopause    Migraine    Palpitations    Preventative health care 01/18/2016   Sleep apnea 02/02/2015   Thyroid disease     Past Surgical History:  Procedure Laterality Date   ABDOMINAL HYSTERECTOMY     took right ovary and uterus   APPENDECTOMY     CARPAL TUNNEL RELEASE Right    CESAREAN SECTION     CHOLECYSTECTOMY     EYE SURGERY Bilateral 2017   cataracts   KNEE ARTHROPLASTY     SHOULDER ARTHROSCOPY W/ ROTATOR CUFF REPAIR Left    THYROIDECTOMY  2002    There were no vitals filed for this visit.    Subjective Assessment - 08/31/21 1533     Subjective Pt reports she was here last time for neck pain and has kept up some the exercises for a while. While she had been coming to PT she started having episodes of unexplained weakness - generalized, but more noticeable on the R. She has had multiple tests (MRI/CT) which have thus far been inconclusive. Still having R  sided neck pain and R UE weakness/numbess - the R sided weakness seems to have lingered from her latest episode on the Friday of NYD weekend.    Diagnostic tests 07/31/21 - cervical MRI: 1. Unchanged examination of the cervical spine (from 02/22/20 MRI) with moderate right C3-4 and mild right C4-5 and left C6-7 neural foraminal stenosis.  2. No spinal canal stenosis.   Multiple head CTs along with MRI & MRA of neck brain from 04/2021 through 06/2021 - all negative with no abnormalities noted.    Patient Stated Goals "less, if not no pain in my neck and improved strength in R side as well as improve my balance"    Currently in Pain? Yes    Pain Score 2    1-2/10; up to 7-8/10 at worst   Pain Location Neck    Pain Orientation Right    Pain Descriptors / Indicators Sore   "gnawing" at night   Pain Type Chronic pain    Pain Radiating Towards pain into R shoulder, numbness and tingling in R arm and hand    Pain Onset Other (comment)   2-3 yrs   Pain Frequency Intermittent  Aggravating Factors  laying down, sometimes turning head to R    Pain Relieving Factors heating pad, topical analgesics, being more active    Effect of Pain on Daily Activities limited with turning head, clumsy with R hand - limited grip                [facility] PT Assessment - 08/31/21 1530       Assessment   Medical Diagnosis R sided neck pain and stiffness; R UE weakness/numbness    Referring Provider (PT) [name], MD    Onset Date/Surgical Date --   2-3 yrs   Next MD Visit 09/02/21    Prior Therapy PT for R sided neck pain in late 2022 - discontinued at the time due to new epsiodes of unexplained weakness      Precautions   Precautions None      Restrictions   Weight Bearing Restrictions No      Balance Screen   Has the patient fallen in the past 6 months No    Has the patient had a decrease in activity level because of a fear of falling?  Yes    Is the patient reluctant to leave their home because of a  fear of falling?  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    Leisure household chores, assisting with care for elderly mother (currently in rehab)      Cognition   Overall Cognitive Status Within Functional Limits for tasks assessed      Observation/Other Assessments   Focus on Therapeutic Outcomes (FOTO)  Neck = 56 (risk adjusted = 51); predicted D/C FS = 61      Posture/Postural Control   Posture/Postural Control Postural limitations    Postural Limitations Forward head;Rounded Shoulders    Posture Comments R shoulder protracted      AROM   Overall AROM Comments B shoulder IR/ER ROM WFL    AROM Assessment Site Cervical;Shoulder    Right/Left Shoulder Right;Left    Right Shoulder Flexion 134 Degrees    Right Shoulder ABduction 152 Degrees    Left Shoulder Flexion 144 Degrees    Left Shoulder ABduction 155 Degrees    Cervical Flexion 52    Cervical Extension 16    Cervical - Right Side Bend 19    Cervical - Left Side Bend 22    Cervical - Right Rotation 48    Cervical - Left Rotation 27      Strength   Right Shoulder Flexion 4-/5    Right Shoulder ABduction 4-/5    Right Shoulder Internal Rotation 4+/5    Right Shoulder External Rotation 4-/5    Left Shoulder Flexion 4/5    Left Shoulder ABduction 4+/5    Left Shoulder Internal Rotation 4+/5    Left Shoulder External Rotation 4/5      Palpation   Palpation comment increased muscle tension and TTP in R>L UT, LS cervical paraspinals, suboccipitals, and to a lesser degree in rhomboids & pecs  Objective measurements  completed on examination: See above findings.       [facility] Adult PT Treatment/Exercise - 08/31/21 1530       Neck Exercises: Seated   Neck Retraction 10 reps;5 secs    Other Seated Exercise Scap retraction & depression 10 x 5"      Neck Exercises: Stretches   Upper Trapezius Stretch Right;Left;1 rep;30 seconds    Levator Stretch Right;Left;1 rep;30 seconds    Other Neck Stretches R low and mid doorway pec stretches with straight arm x 30 sec each                       PT Short Term Goals - 08/31/21 1618       PT SHORT TERM GOAL #1   Title Patient will be independent with initial HEP    Status New    Target Date 09/14/21               PT Long Term Goals - 08/31/21 1747       PT LONG TERM GOAL #1   Title Patient will be independent with ongoing/advanced HEP for self-management at home    Status New    Target Date 10/12/21      PT LONG TERM GOAL #2   Title Improve posture and alignment with patient to demonstrate improved upright posture with posterior shoulder girdle engaged    Status New    Target Date 10/12/21      PT LONG TERM GOAL #3   Title Decrease R sided neck pain by >/= 50-75% allowing patient increased ease of cervical ROM and improved comfort in preferred sleeping position    Status New    Target Date 10/12/21      PT LONG TERM GOAL #4   Title Patient to improve cervical AROM to [facility] without pain provocation    Status New    Target Date 10/12/21      PT LONG TERM GOAL #5   Title Patient will demonstrate improved B shoulder strength to >/= 4+/5 for functional UE use    Status New    Target Date 10/12/21      PT LONG TERM GOAL #6   Title Patient will report no sleep disturbance due to R sided neck pain    Status New    Target Date 10/12/21                    Plan - 08/31/21 1618     Clinical Impression Statement Monica Wallace is a 71 y/o female who presents to OP PT for chronic neck pain originating ~2-3 yrs ago. She was  previously seen in this clinic for R-sided neck pain in Oct 2022 but was unable to complete the episode or the subsequently scheduled new eval for the same problem in Dec 2022 due to recurring episodes of global and R-sided weakness/paresis for which her doctors have yet to determine a cause (all testing and imaging negative thus far). She continues to experience R-sided neck and upper shoulder pain with new R-sided weakness which she feels is residual from the last of the episodes which occurred on 07/16/21. Pain continues to interfere with neck ROM as well as ability to find a comfortable position to relax while laying down or sleeping on her R side (preferred sleeping position). Current deficits include R sided neck pain, postural abnormalities including forward head and rounded shoulder R>L, limited and painful cervical AROM, R shoulder weakness,  increased soft tissue restriction and TTP in R>L UT, LS and cervical paraspinals, increased muscle tension and TTP in R>L UT, LS, cervical paraspinals and suboccipitals, and to a lesser degree in rhomboids and pecs. Monica Wallace will benefit from resumption of skilled PT to address above deficits to improve posture, restore pain free functional cervical ROM and to improve tolerance for normal daily and work activities.    Personal Factors and Comorbidities Age;Behavior Pattern;Comorbidity 3+;Fitness;Profession;Past/Current Experience;Time since onset of injury/illness/exacerbation    Comorbidities HTN, migraine, asthma, sinusitis, pre-diabetes/DM-II, GERD, OSA, thyroid cancer s/p thyroidectomy 2002, LBP with L sciatica, DJD, OA, insomnia, R CTR    Examination-Activity Limitations Caring for Others;Lift;Carry;Reach Overhead;Sleep    Examination-Participation Restrictions Church;Cleaning;Community Activity;Driving;Laundry;Occupation;School;Shop;Volunteer    Stability/Clinical Decision Making Evolving/Moderate complexity    Clinical Decision Making Moderate    Rehab  Potential Good    PT Frequency 2x / week    PT Duration 6 weeks    PT Treatment/Interventions ADLs/Self Care Home Management;Cryotherapy;Electrical Stimulation;Iontophoresis 4mg /ml Dexamethasone;Moist Heat;Traction;Ultrasound;Functional mobility training;Therapeutic activities;Therapeutic exercise;Balance training;Neuromuscular re-education;Patient/family education;Manual techniques;Passive range of motion;Dry needling;Taping;Spinal Manipulations;Joint Manipulations    PT Next Visit Plan review HEP and update as indicated; postural awareness and stretching; postural strengthening; MT to suboccipitals, UT, LS, cervical paraspinals, rhomboids and pecs with possible DN to UT/LS    PT Home Exercise Plan Access Code: [email or web address] (2/14)    Consulted and Agree with Plan of Care Patient             Patient will benefit from skilled therapeutic intervention in order to improve the following deficits and impairments:  Decreased activity tolerance, Decreased endurance, Decreased knowledge of precautions, Decreased mobility, Decreased range of motion, Decreased strength, Increased fascial restricitons, Increased muscle spasms, Impaired perceived functional ability, Impaired flexibility, Impaired UE functional use, Improper body mechanics, Postural dysfunction, Pain, Decreased balance  Visit Diagnosis: Cervicalgia - Plan: PT plan of care cert/re-cert  Abnormal posture - Plan: PT plan of care cert/re-cert  Muscle weakness (generalized) - Plan: PT plan of care cert/re-cert  Other symptoms and signs involving the musculoskeletal system - Plan: PT plan of care cert/re-cert  Other muscle spasm - Plan: PT plan of care cert/re-cert     Problem List Patient Active Problem List   Diagnosis Date Noted   Cervical cancer screening 03/07/2021   Sinusitis 10/30/2020   Allergic conjunctivitis 04/08/2020   Neck pain 02/06/2020   Acute bronchitis 01/13/2020   Otitis media 09/30/2019   Hip pain, right  09/30/2019   COVID-19 09/30/2019   Educated about COVID-19 virus infection 12/05/2018   High risk heterosexual behavior 11/20/2017   Baker's cyst of knee, right 02/16/2017   Insomnia 07/23/2016   Hypokalemia 07/23/2016   Controlled type 2 diabetes mellitus without complication, without long-term current use of insulin (HCC) 01/24/2016   Preventative health care 01/18/2016   Left-sided low back pain with left-sided sciatica 10/07/2015   Obesity (BMI 30-39.9) 06/05/2015   OSA (obstructive sleep apnea) 02/02/2015   Palpitations 09/19/2014   Plantar fasciitis, bilateral 11/19/2013   Pronation deformity of ankle, acquired 11/19/2013   DJD (degenerative joint disease) 08/26/2013   Fibrocystic breast 08/26/2013   HTN (hypertension) 08/26/2013   GERD (gastroesophageal reflux disease) 08/26/2013   S/P hysterectomy with oophorectomy 08/26/2013   Hypothyroidism 08/26/2013   Chronic hoarseness 08/26/2013   Colon polyp 08/26/2013   Hiatal hernia 08/26/2013   Irritable bowel syndrome 08/26/2013   Moderate persistent asthma 08/26/2013   Allergic rhinoconjunctivitis 08/26/2013   Hyperlipidemia 08/22/2013   Migraine 08/22/2013   Thyroid  cancer ([place]) 08/22/2013    Percival Spanish, PT 08/31/2021, 6:08 PM  [facility] [address]  [place], [place], [address] Phone: [phone]   Fax:  [phone]  Name: Monica Wallace MRN: [id number] Date of Birth: June 28, 1951

## 2021-09-02 ENCOUNTER — Ambulatory Visit: Payer: Medicare Other | Admitting: Family Medicine

## 2021-09-07 ENCOUNTER — Ambulatory Visit (INDEPENDENT_AMBULATORY_CARE_PROVIDER_SITE_OTHER): Payer: Medicare Other | Admitting: Family

## 2021-09-07 VITALS — BP 128/67 | HR 82 | Temp 98.6°F | Resp 16 | Wt 204.0 lb

## 2021-09-07 DIAGNOSIS — I10 Essential (primary) hypertension: Secondary | ICD-10-CM

## 2021-09-07 DIAGNOSIS — M5416 Radiculopathy, lumbar region: Secondary | ICD-10-CM | POA: Diagnosis not present

## 2021-09-07 DIAGNOSIS — E119 Type 2 diabetes mellitus without complications: Secondary | ICD-10-CM | POA: Diagnosis not present

## 2021-09-07 DIAGNOSIS — E876 Hypokalemia: Secondary | ICD-10-CM | POA: Diagnosis not present

## 2021-09-07 DIAGNOSIS — E890 Postprocedural hypothyroidism: Secondary | ICD-10-CM | POA: Diagnosis not present

## 2021-09-07 NOTE — Progress Notes (Signed)
Subjective:     Patient ID: Monica Wallace, female    DOB: 1950-12-10, 71 y.o.   MRN: [id number]  Chief Complaint  Patient presents with   Hypertension    Here for follow up   Hypothyroidism    Manage by endo    HPI Patient is in today for follow up.  Reports that she had a sinus surgery this past summer which has helped a lot with her sinus infections.  Reports that she has had multiple ER visits with LE weakness and "I can hardly walk."  Reports that she has had 5 episodes like this.  She has seen neuro.  Reports that she initially had RLE weakness that she has been exercising on her own. She started PT but PT seems to be focusing on her neck pain.    Saw cardiology (Dr. Stanford Breed)- was seen on 05/26/21. There was concern that her presyncopal episodes were orthostatic.  As a result, her maxide was discontinued and she was instead placed on daily lasix 45m. She states that as soon as she was placed on lasix (I immediately gained 7 pounds). As a result, she states that she has been alternating maxide/lasix QOD.   HTN- was 152/86 this am at home. Reports that her bp has not been as good as it was when she was on maxide.  She has been alternating maxide with lasix.  Wt Readings from Last 3 Encounters:  09/07/21 204 lb (92.5 kg)  08/16/21 205 lb (93 kg)  07/21/21 202 lb (91.6 kg)    BP Readings from Last 3 Encounters:  09/07/21 128/67  08/16/21 118/76  07/21/21 (!) 155/81   Lab Results  Component Value Date   HGBA1C 6.3 09/07/2021   HGBA1C 6.4 03/02/2021   HGBA1C 6.7 (H) 10/29/2020   Lab Results  Component Value Date   LDLCALC 114 (H) 03/02/2021   CREATININE 0.85 09/07/2021    Hypothyroid- continues synthroid 1561m.  Lab Results  Component Value Date   TSH 0.69 06/08/2021     Health Maintenance Due  Topic Date Due   FOOT EXAM  Never done   URINE MICROALBUMIN  Never done   Zoster Vaccines- Shingrix (1 of 2) Never done   COLON CANCER SCREENING ANNUAL FOBT   07/02/2015   COVID-19 Vaccine (4 - Booster for Pfizer series) 08/15/2020    Past Medical History:  Diagnosis Date   Arthritis    Asthma    Baker's cyst of knee, right 02/16/2017   [place] [place]   [place] (2002)   Cystic breast    Diabetes ([facility]   GERD (gastroesophageal reflux disease)    Hepatic cyst 02/02/2015   Hyperlipidemia    Hypertension    Insomnia 07/23/2016   Menopause    Migraine    Palpitations    Preventative health care 01/18/2016   Sleep apnea 02/02/2015   Thyroid disease     Past Surgical History:  Procedure Laterality Date   ABDOMINAL HYSTERECTOMY     took right ovary and uterus   APPENDECTOMY     CARPAL TUNNEL RELEASE Right    CESAREAN SECTION     CHOLECYSTECTOMY     EYE SURGERY Bilateral 2017   cataracts   KNEE ARTHROPLASTY     SHOULDER ARTHROSCOPY W/ ROTATOR CUFF REPAIR Left    THYROIDECTOMY  2002    Family History  Problem Relation Age of Onset   Arthritis Mother    Transient ischemic attack Mother    Hypertension  Mother    Hyperlipidemia Mother    Diabetes Father    Heart disease Father    Arthritis Father    Kidney disease Father    Hypertension Father    Hyperlipidemia Father    Cancer Sister 4       pancreatic   Fibromyalgia Sister    Allergic rhinitis Sister    Asthma Maternal Grandfather        smoker   Depression Maternal Grandfather    Dementia Paternal Grandmother    Diabetes Paternal Grandfather    Hypertension Brother    Allergic rhinitis Brother    Scoliosis Daughter        had rods placed and removed   Arthritis Sister    Gout Sister    GER disease Sister    Cancer Sister        breast cancer   Breast cancer Sister 29    Social History   Socioeconomic History   Marital status: Divorced    Spouse name: Not on file   Number of children: 2   Years of education: Not on file   Highest education level: Not on file  Occupational History   Not on file  Tobacco Use   Smoking status: Never   Smokeless  tobacco: Never  Vaping Use   Vaping Use: Never used  Substance and Sexual Activity   Alcohol use: Yes    Alcohol/week: 0.0 standard drinks    Comment: very rare once or twice a year   Drug use: No   Sexual activity: Yes    Birth control/protection: Other-see comments, Surgical    Comment:  boyfriend  Other Topics Concern   Not on file  Social History Narrative   Household: pt, daughter and sister    Social Determinants of Health   Financial Resource Strain: Low Risk    Difficulty of Paying Living Expenses: Not hard at all  Food Insecurity: No Food Insecurity   Worried About Charity fundraiser in the Last Year: Never true   Arboriculturist in the Last Year: Never true  Transportation Needs: No Transportation Needs   Lack of Transportation (Medical): No   Lack of Transportation (Non-Medical): No  Physical Activity: Inactive   Days of Exercise per Week: 0 days   Minutes of Exercise per Session: 0 min  Stress: No Stress Concern Present   Feeling of Stress : Not at all  Social Connections: Moderately Isolated   Frequency of Communication with Friends and Family: More than three times a week   Frequency of Social Gatherings with Friends and Family: More than three times a week   Attends Religious Services: More than 4 times per year   Active Member of [employer] or Organizations: No   Attends Archivist Meetings: Never   Marital Status: Divorced  Human resources officer Violence: Not At Risk   Fear of Current or Ex-Partner: No   Emotionally Abused: No   Physically Abused: No   Sexually Abused: No    Outpatient Medications Prior to Visit  Medication Sig Dispense Refill   acetaminophen (TYLENOL) 325 MG tablet Take 650 mg by mouth every 6 (six) hours as needed.     albuterol (VENTOLIN HFA) 108 (90 Base) MCG/ACT inhaler INHALE 2 PUFFS INTO THE LUNGS EVERY 6 HOURS AS NEEDED FOR WHEEZING OR SHORTNESS OF BREATH 25.5 g 1   azelastine (ASTELIN) 0.1 % nasal spray Place 1 spray in each  nostril twice a day as needed for  runny nose/drainage down throat (Patient taking differently: Place 1 spray into both nostrils daily.) 90 mL 1   Blood Glucose Monitoring Suppl ([place]) w/Device KIT Use to check blood glucose once a day.  DX code: E11.9 1 kit 0   budesonide (PULMICORT) 180 MCG/ACT inhaler Inhale 1-2 puffs into the lungs 2 (two) times daily. 3 each 1   Cholecalciferol ([employer]S VIT D 5000 HIGH-POTENCY PO) Take 5,000 Units by mouth daily.     estradiol (ESTRACE VAGINAL) 0.1 MG/GM vaginal cream Place 1 Applicatorful vaginally at bedtime. 42.5 g 12   fluticasone (FLONASE) 50 MCG/ACT nasal spray SHAKE LIQUID AND USE 2 SPRAYS IN EACH NOSTRIL DAILY 48 g 1   glucose blood (ONETOUCH VERIO) test strip Use to check blood glucose once a day.  DX code: E11.9 100 each 1   hyoscyamine (LEVSIN SL) 0.125 MG SL tablet Place 1 tablet (0.125 mg total) under the tongue every 4 (four) hours as needed. 30 tablet 1   Lancets (ONETOUCH DELICA PLUS RAQTMA26J) MISC Use to check blood glucose once a day.  DX code: E11.9 100 each 1   loratadine (CLARITIN) 10 MG tablet Take 10 mg by mouth in the morning and at bedtime.      montelukast (SINGULAIR) 10 MG tablet TAKE 1 TABLET(10 MG) BY MOUTH DAILY AS NEEDED (Patient taking differently: Take 10 mg by mouth at bedtime.) 90 tablet 1   Omega-3 1000 MG CAPS Take 1,000 mg by mouth daily.     omeprazole (PRILOSEC) 40 MG capsule Take 1 capsule by mouth daily.     rosuvastatin (CRESTOR) 5 MG tablet TAKE 1 TAKE ONE TABLET EVERY__ HOURS TAKE ONE TABLET EVERY__ HOURS TAB BY MOUTH 2 TIMES A WEEK(TUE AND SUN WEEKLY) 25 tablet 1   SYNTHROID 150 MCG tablet TAKE 1 TABLET(150 MCG) BY MOUTH DAILY BEFORE AND BREAKFAST (Patient taking differently: Take 150 mcg by mouth daily before breakfast.) 90 tablet 1   furosemide (LASIX) 20 MG tablet Take 1 tablet (20 mg total) by mouth daily. 90 tablet 3   No facility-administered medications prior to visit.    Allergies   Allergen Reactions   Levaquin [Levofloxacin] Shortness Of Breath   Penicillins Other (See Comments)    Positive on allergy testing 'years ago.' Has never had penicillin. Has tolerated amoxicillin.   Clindamycin/Lincomycin Other (See Comments)    Pruritus No rash   Codeine Itching   Gabapentin Itching    ROS See HPI    Objective:    Physical Exam Constitutional:      General: She is not in acute distress.    Appearance: Normal appearance. She is well-developed.  HENT:     Head: Normocephalic and atraumatic.     Right Ear: External ear normal.     Left Ear: External ear normal.  Eyes:     General: No scleral icterus. Neck:     Thyroid: No thyromegaly.  Cardiovascular:     Rate and Rhythm: Normal rate and regular rhythm.     Heart sounds: Normal heart sounds. No murmur heard. Pulmonary:     Effort: Pulmonary effort is normal. No respiratory distress.     Breath sounds: Normal breath sounds. No wheezing.  Musculoskeletal:     Cervical back: Neck supple.  Skin:    General: Skin is warm and dry.  Neurological:     Mental Status: She is alert and oriented to person, place, and time.     Deep Tendon Reflexes:  Reflex Scores:      Patellar reflexes are 0 on the right side and 2+ on the left side.    Comments: RLE strength 4-5/5 LLE strength 5/5   Psychiatric:        Mood and Affect: Mood normal.        Behavior: Behavior normal.        Thought Content: Thought content normal.        Judgment: Judgment normal.    BP 128/67 (BP Location: Right Arm, Patient Position: Sitting, Cuff Size: Small)    Pulse 82    Temp 98.6 F (37 C) (Oral)    Resp 16    Wt 204 lb (92.5 kg)    SpO2 100%    BMI 37.31 kg/m  Wt Readings from Last 3 Encounters:  09/07/21 204 lb (92.5 kg)  08/16/21 205 lb (93 kg)  07/21/21 202 lb (91.6 kg)       Assessment & Plan:   Problem List Items Addressed This Visit       Unprioritized   Lumbar radiculopathy    She has had MRI of brain and  c-spine.  I would like to obtain an MRI of the lumbar spine for further evaluation.       Relevant Orders   MR Lumbar Spine Wo Contrast   Hypothyroidism    TSH WNL. Continue synthroid 150 mcg.       Hypokalemia   Relevant Orders   Comp Met (CMET) (Completed)   HTN (hypertension)    BP is stable. She is wanting to return to daily maxide in place of lasix for improved BP control and improved fluid management.  I advised her that I would reach out to her cardiologist since he made the original change.       Controlled type 2 diabetes mellitus without complication, without long-term current use of insulin ([place]) - Primary    Diet controlled. Obtain follow up A1C.       Relevant Orders   Hemoglobin A1c (Completed)    I am having Monica Wallace maintain her loratadine, Cholecalciferol (CVS VIT D 5000 HIGH-POTENCY PO), acetaminophen, albuterol, Omega-3, OneTouch Delica Plus [id number], OneTouch Verio Flex System, OneTouch Verio, omeprazole, azelastine, budesonide, montelukast, hyoscyamine, Synthroid, estradiol, furosemide, fluticasone, and rosuvastatin.  No orders of the defined types were placed in this encounter.

## 2021-09-07 NOTE — Patient Instructions (Signed)
Please complete lab work prior to leaving.   

## 2021-09-08 ENCOUNTER — Ambulatory Visit: Payer: Medicare Other

## 2021-09-08 ENCOUNTER — Other Ambulatory Visit: Payer: Self-pay

## 2021-09-08 DIAGNOSIS — M542 Cervicalgia: Secondary | ICD-10-CM | POA: Diagnosis not present

## 2021-09-08 DIAGNOSIS — M62838 Other muscle spasm: Secondary | ICD-10-CM

## 2021-09-08 DIAGNOSIS — G8929 Other chronic pain: Secondary | ICD-10-CM

## 2021-09-08 DIAGNOSIS — M6281 Muscle weakness (generalized): Secondary | ICD-10-CM

## 2021-09-08 DIAGNOSIS — R293 Abnormal posture: Secondary | ICD-10-CM

## 2021-09-08 DIAGNOSIS — R29898 Other symptoms and signs involving the musculoskeletal system: Secondary | ICD-10-CM

## 2021-09-08 LAB — COMPREHENSIVE METABOLIC PANEL
ALT: 16 U/L (ref 0–35)
AST: 21 U/L (ref 0–37)
Albumin: 4.2 g/dL (ref 3.5–5.2)
Alkaline Phosphatase: 70 U/L (ref 39–117)
BUN: 17 mg/dL (ref 6–23)
CO2: 35 mEq/L — ABNORMAL HIGH (ref 19–32)
Calcium: 10.1 mg/dL (ref 8.4–10.5)
Chloride: 98 mEq/L (ref 96–112)
Creatinine, Ser: 0.85 mg/dL (ref 0.40–1.20)
GFR: 68.38 mL/min (ref >60.00)
Glucose, Bld: 98 mg/dL (ref 70–99)
Potassium: 3.5 mEq/L (ref 3.5–5.1)
Sodium: 137 mEq/L (ref 135–145)
Total Bilirubin: 0.3 mg/dL (ref 0.2–1.2)
Total Protein: 7.4 g/dL (ref 6.0–8.3)

## 2021-09-08 LAB — HEMOGLOBIN A1C: Hgb A1c MFr Bld: 6.3 % (ref 4.6–6.5)

## 2021-09-08 NOTE — Therapy (Signed)
[facility] [address]  [place], [place], [address] Phone: [phone]   Fax:  [phone]  Physical Therapy Treatment  Patient Details  Name: Monica Wallace MRN: [id number] Date of Birth: November 11, 1950 Referring Provider (PT): [name], MD   Encounter Date: 09/08/2021   PT End of Session - 09/08/21 1707     Visit Number 2    Number of Visits 12    Date for PT Re-Evaluation 10/12/21    Authorization Type Medicare & AARP    PT Start Time 1620    PT Stop Time 1700    PT Time Calculation (min) 40 min    Activity Tolerance Patient tolerated treatment well    Behavior During Therapy [facility] for tasks assessed/performed             Past Medical History:  Diagnosis Date   Arthritis    Asthma    Baker's cyst of knee, right 02/16/2017   Cancer ([place]) 2002   thyroid (2002)   Cystic breast    Diabetes ([place])    GERD (gastroesophageal reflux disease)    Hepatic cyst 02/02/2015   Hyperlipidemia    Hypertension    Insomnia 07/23/2016   Menopause    Migraine    Palpitations    Preventative health care 01/18/2016   Sleep apnea 02/02/2015   Thyroid disease     Past Surgical History:  Procedure Laterality Date   ABDOMINAL HYSTERECTOMY     took right ovary and uterus   APPENDECTOMY     CARPAL TUNNEL RELEASE Right    CESAREAN SECTION     CHOLECYSTECTOMY     EYE SURGERY Bilateral 2017   cataracts   KNEE ARTHROPLASTY     SHOULDER ARTHROSCOPY W/ ROTATOR CUFF REPAIR Left    THYROIDECTOMY  2002    There were no vitals filed for this visit.   Subjective Assessment - 09/08/21 1621     Subjective Pt reports ongoing stiffness along the R side of the neck but more noteable pain today.    Diagnostic tests 07/31/21 - cervical MRI: 1. Unchanged examination of the cervical spine (from 02/22/20 MRI) with moderate right C3-4 and mild right C4-5 and left C6-7 neural foraminal stenosis.  2. No spinal canal stenosis.    Multiple head CTs along with MRI & MRA of neck brain from 04/2021 through 06/2021 - all negative with no abnormalities noted.    Patient Stated Goals "less, if not no pain in my neck and improved strength in R side as well as improve my balance"    Currently in Pain? Yes    Pain Score 2    worse when laying down   Pain Location Neck    Pain Orientation Right    Pain Descriptors / Indicators Sharp;Dull                               [facility] Adult PT Treatment/Exercise - 09/08/21 0001       Exercises   Exercises Neck      Neck Exercises: Machines for Strengthening   UBE (Upper Arm Bike) L1.0 x 6 min (60min fwd/21minback)      Neck Exercises: Theraband   Shoulder External Rotation 10 reps;Red   2x10   Shoulder External Rotation Limitations pool noodle on wall    Horizontal ABduction 10 reps;Red   2x10   Horizontal ABduction Limitations pool noodle  on wall      Neck Exercises: Standing   Wall Push Ups 10 reps   2x10, each set increasing in ROM     Neck Exercises: Seated   Cervical Rotation Both;10 reps    Cervical Rotation Limitations SNAGs    Other Seated Exercise SNAGs into extension 10x3"      Manual Therapy   Manual Therapy Soft tissue mobilization    Soft tissue mobilization to L lats, teres group, rhomboids, LS                       PT Short Term Goals - 09/08/21 1632       PT SHORT TERM GOAL #1   Title Patient will be independent with initial HEP    Status On-going    Target Date 09/14/21               PT Long Term Goals - 09/08/21 1632       PT LONG TERM GOAL #1   Title Patient will be independent with ongoing/advanced HEP for self-management at home    Status On-going    Target Date 10/12/21      PT LONG TERM GOAL #2   Title Improve posture and alignment with patient to demonstrate improved upright posture with posterior shoulder girdle engaged    Status On-going    Target Date 10/12/21      PT LONG TERM GOAL #3    Title Decrease R sided neck pain by >/= 50-75% allowing patient increased ease of cervical ROM and improved comfort in preferred sleeping position    Status On-going    Target Date 10/12/21      PT LONG TERM GOAL #4   Title Patient to improve cervical AROM to [facility] without pain provocation    Status On-going    Target Date 10/12/21      PT LONG TERM GOAL #5   Title Patient will demonstrate improved B shoulder strength to >/= 4+/5 for functional UE use    Status On-going    Target Date 10/12/21      PT LONG TERM GOAL #6   Title Patient will report no sleep disturbance due to R sided neck pain    Status On-going    Target Date 10/12/21                   Plan - 09/08/21 1708     Clinical Impression Statement Continued to progress postural exercises today. Pt did require cues with ER and horizontal ABD for full periscap engagement and for good GH joint position to decrease pain. We tried a standing open book but this caused a muscle spasm in the L lat muscle radiating to around the scapula. Ended session with STM which did help to decrease the spasm.    Personal Factors and Comorbidities Age;Behavior Pattern;Comorbidity 3+;Fitness;Profession;Past/Current Experience;Time since onset of injury/illness/exacerbation    Comorbidities HTN, migraine, asthma, sinusitis, pre-diabetes/DM-II, GERD, OSA, thyroid cancer s/p thyroidectomy 2002, LBP with L sciatica, DJD, OA, insomnia, R CTR    PT Frequency 2x / week    PT Duration 6 weeks    PT Treatment/Interventions ADLs/Self Care Home Management;Cryotherapy;Electrical Stimulation;Iontophoresis 4mg /ml Dexamethasone;Moist Heat;Traction;Ultrasound;Functional mobility training;Therapeutic activities;Therapeutic exercise;Balance training;Neuromuscular re-education;Patient/family education;Manual techniques;Passive range of motion;Dry needling;Taping;Spinal Manipulations;Joint Manipulations    PT Next Visit Plan review HEP and update as indicated;  postural awareness and stretching; postural strengthening; MT to suboccipitals, UT, LS, cervical paraspinals, rhomboids and pecs with possible DN  to UT/LS    PT Home Exercise Plan Access Code: [email or web address] ([email or web address])    Consulted and Agree with Plan of Care Patient             Patient will benefit from skilled therapeutic intervention in order to improve the following deficits and impairments:  Decreased activity tolerance, Decreased endurance, Decreased knowledge of precautions, Decreased mobility, Decreased range of motion, Decreased strength, Increased fascial restricitons, Increased muscle spasms, Impaired perceived functional ability, Impaired flexibility, Impaired UE functional use, Improper body mechanics, Postural dysfunction, Pain, Decreased balance  Visit Diagnosis: Cervicalgia  Abnormal posture  Muscle weakness (generalized)  Other symptoms and signs involving the musculoskeletal system  Other muscle spasm  Chronic neck pain     Problem List Patient Active Problem List   Diagnosis Date Noted   Cervical cancer screening 03/07/2021   Sinusitis 10/30/2020   Allergic conjunctivitis 04/08/2020   Neck pain 02/06/2020   Acute bronchitis 01/13/2020   Otitis media 09/30/2019   Hip pain, right 09/30/2019   COVID-19 09/30/2019   Educated about COVID-19 virus infection 12/05/2018   High risk heterosexual behavior 11/20/2017   Baker's cyst of knee, right 02/16/2017   Insomnia 07/23/2016   Hypokalemia 07/23/2016   Controlled type 2 diabetes mellitus without complication, without long-term current use of insulin ([place]) 01/24/2016   Preventative health care 01/18/2016   Left-sided low back pain with left-sided sciatica 10/07/2015   Obesity (BMI 30-39.9) 06/05/2015   OSA (obstructive sleep apnea) 02/02/2015   Palpitations 09/19/2014   Plantar fasciitis, bilateral 11/19/2013   Pronation deformity of ankle, acquired 11/19/2013   DJD (degenerative joint disease) 08/26/2013    Fibrocystic breast 08/26/2013   HTN (hypertension) 08/26/2013   GERD (gastroesophageal reflux disease) 08/26/2013   S/P hysterectomy with oophorectomy 08/26/2013   Hypothyroidism 08/26/2013   Chronic hoarseness 08/26/2013   Colon polyp 08/26/2013   Hiatal hernia 08/26/2013   Irritable bowel syndrome 08/26/2013   Moderate persistent asthma 08/26/2013   Allergic rhinoconjunctivitis 08/26/2013   Hyperlipidemia 08/22/2013   Migraine 08/22/2013   Thyroid cancer ([place]) 08/22/2013    Artist Pais, PTA 09/08/2021, 5:53 PM  [facility] [address]  [place], [place], [address] Phone: [phone]   Fax:  [phone]  Name: RHILYNN PREYER MRN: [id number] Date of Birth: 03-29-1951

## 2021-09-09 ENCOUNTER — Telehealth: Payer: Self-pay | Admitting: Family

## 2021-09-09 DIAGNOSIS — M5416 Radiculopathy, lumbar region: Secondary | ICD-10-CM | POA: Insufficient documentation

## 2021-09-09 NOTE — Assessment & Plan Note (Signed)
She has had MRI of brain and c-spine.  I would like to obtain an MRI of the lumbar spine for further evaluation.

## 2021-09-09 NOTE — Assessment & Plan Note (Signed)
TSH WNL. Continue synthroid 150 mcg.

## 2021-09-09 NOTE — Assessment & Plan Note (Signed)
Diet controlled. Obtain follow up A1C.

## 2021-09-09 NOTE — Assessment & Plan Note (Signed)
BP is stable. She is wanting to return to daily maxide in place of lasix for improved BP control and improved fluid management.  I advised her that I would reach out to her cardiologist since he made the original change.

## 2021-09-09 NOTE — Telephone Encounter (Signed)
See mychart.  

## 2021-09-13 ENCOUNTER — Ambulatory Visit: Payer: Medicare Other

## 2021-09-13 ENCOUNTER — Other Ambulatory Visit: Payer: Self-pay

## 2021-09-13 DIAGNOSIS — M542 Cervicalgia: Secondary | ICD-10-CM

## 2021-09-13 DIAGNOSIS — M6281 Muscle weakness (generalized): Secondary | ICD-10-CM

## 2021-09-13 DIAGNOSIS — M62838 Other muscle spasm: Secondary | ICD-10-CM | POA: Diagnosis not present

## 2021-09-13 DIAGNOSIS — R293 Abnormal posture: Secondary | ICD-10-CM

## 2021-09-13 DIAGNOSIS — R29898 Other symptoms and signs involving the musculoskeletal system: Secondary | ICD-10-CM

## 2021-09-13 DIAGNOSIS — G8929 Other chronic pain: Secondary | ICD-10-CM | POA: Diagnosis not present

## 2021-09-13 NOTE — Therapy (Signed)
[facility] [address]  [place], [place], [address] Phone: [phone]   Fax:  [phone]  Physical Therapy Treatment  Patient Details  Name: Monica Wallace MRN: [id number] Date of Birth: Apr 22, 1951 Referring Provider (PT): [name], MD   Encounter Date: 09/13/2021   PT End of Session - 09/13/21 1714     Visit Number 3    Number of Visits 12    Date for PT Re-Evaluation 10/12/21    Authorization Type Medicare & AARP    PT Start Time 6440    PT Stop Time 1708    PT Time Calculation (min) 51 min    Activity Tolerance Patient tolerated treatment well    Behavior During Therapy [facility] for tasks assessed/performed             Past Medical History:  Diagnosis Date   Arthritis    Asthma    Baker's cyst of knee, right 02/16/2017   Cancer ([place]) 2002   thyroid (2002)   Cystic breast    Diabetes ([place])    GERD (gastroesophageal reflux disease)    Hepatic cyst 02/02/2015   Hyperlipidemia    Hypertension    Insomnia 07/23/2016   Menopause    Migraine    Palpitations    Preventative health care 01/18/2016   Sleep apnea 02/02/2015   Thyroid disease     Past Surgical History:  Procedure Laterality Date   ABDOMINAL HYSTERECTOMY     took right ovary and uterus   APPENDECTOMY     CARPAL TUNNEL RELEASE Right    CESAREAN SECTION     CHOLECYSTECTOMY     EYE SURGERY Bilateral 2017   cataracts   KNEE ARTHROPLASTY     SHOULDER ARTHROSCOPY W/ ROTATOR CUFF REPAIR Left    THYROIDECTOMY  2002    There were no vitals filed for this visit.   Subjective Assessment - 09/13/21 1621     Subjective Overdid it on the kitchen yesterday, neck pain is still there but less intense, my knee is bothering me more now and I am going to the orthopedist about it.    Diagnostic tests 07/31/21 - cervical MRI: 1. Unchanged examination of the cervical spine (from 02/22/20 MRI) with moderate right C3-4 and mild right C4-5 and left  C6-7 neural foraminal stenosis.  2. No spinal canal stenosis.   Multiple head CTs along with MRI & MRA of neck brain from 04/2021 through 06/2021 - all negative with no abnormalities noted.    Patient Stated Goals "less, if not no pain in my neck and improved strength in R side as well as improve my balance"    Currently in Pain? Yes    Pain Score 2     Pain Location Neck    Pain Orientation Right    Pain Descriptors / Indicators Dull    Pain Type Chronic pain                               [facility] Adult PT Treatment/Exercise - 09/13/21 0001       Neck Exercises: Machines for Strengthening   UBE (Upper Arm Bike) L1.0 x 6 min (107mn fwd/348mback)    Lat Pull 15lb 10 reps      Neck Exercises: Theraband   Shoulder Extension 20 reps;Red    Shoulder Extension Limitations R/L single arm    Rows 20 reps;Red  Rows Limitations R/L single arm row with rotation      Neck Exercises: Stretches   Upper Trapezius Stretch Right;Left;2 reps;30 seconds    Levator Stretch Right;Left;2 reps;30 seconds    Chest Stretch Limitations reviewed for HEP      Manual Therapy   Manual Therapy Soft tissue mobilization;Myofascial release    Manual therapy comments skilled palpation and monitoring of soft tissue during DN    Soft tissue mobilization STM to B UT, LS & rhomboids    Myofascial Release pin and stretch to B UT & LS              Trigger Point Dry Needling - 09/13/21 0001     Consent Given? Yes    Education Handout Provided Previously provided    Muscles Treated Head and Neck Upper trapezius;Levator scapulae    Dry Needling Comments bilateral    Upper Trapezius Response Twitch reponse elicited;Palpable increased muscle length    Levator Scapulae Response Twitch response elicited;Palpable increased muscle length                   PT Education - 09/13/21 1624     Education Details DN rational, procedure, outcomes, potential side effects, and recommended  post-treatment exercises/activity    Person(s) Educated Patient    Methods Explanation   handout previously provided   Comprehension Verbalized understanding              PT Short Term Goals - 09/13/21 1712       PT SHORT TERM GOAL #1   Title Patient will be independent with initial HEP    Status Achieved   09/13/21   Target Date 09/14/21               PT Long Term Goals - 09/08/21 1632       PT LONG TERM GOAL #1   Title Patient will be independent with ongoing/advanced HEP for self-management at home    Status On-going    Target Date 10/12/21      PT LONG TERM GOAL #2   Title Improve posture and alignment with patient to demonstrate improved upright posture with posterior shoulder girdle engaged    Status On-going    Target Date 10/12/21      PT LONG TERM GOAL #3   Title Decrease R sided neck pain by >/= 50-75% allowing patient increased ease of cervical ROM and improved comfort in preferred sleeping position    Status On-going    Target Date 10/12/21      PT LONG TERM GOAL #4   Title Patient to improve cervical AROM to [facility] without pain provocation    Status On-going    Target Date 10/12/21      PT LONG TERM GOAL #5   Title Patient will demonstrate improved B shoulder strength to >/= 4+/5 for functional UE use    Status On-going    Target Date 10/12/21      PT LONG TERM GOAL #6   Title Patient will report no sleep disturbance due to R sided neck pain    Status On-going    Target Date 10/12/21                   Plan - 09/13/21 1717     Clinical Impression Statement Pt came in today with guarded movements due to cervical stiffness and knee OA. Session was started by PTA for warm up, she was interested in TPDN, supervising PT was  brought in to perform DN to decrease muscle guarding along the cervical musculature. PTA concluded session with stretching to the UT and LS, with cues given for depressed scapulae and to avoid trunk lateral flexion. With  the lat pulls, pt had a tendency to over power with the L UE, so we isolated each UE with TB afterward. Briefly reviewed HEP, provided some minor clarifications for pectoral stretch but otherwise STG met.    Personal Factors and Comorbidities Age;Behavior Pattern;Comorbidity 3+;Fitness;Profession;Past/Current Experience;Time since onset of injury/illness/exacerbation    Comorbidities HTN, migraine, asthma, sinusitis, pre-diabetes/DM-II, GERD, OSA, thyroid cancer s/p thyroidectomy 2002, LBP with L sciatica, DJD, OA, insomnia, R CTR    PT Frequency 2x / week    PT Duration 6 weeks    PT Treatment/Interventions ADLs/Self Care Home Management;Cryotherapy;Electrical Stimulation;Iontophoresis 23m/ml Dexamethasone;Moist Heat;Traction;Ultrasound;Functional mobility training;Therapeutic activities;Therapeutic exercise;Balance training;Neuromuscular re-education;Patient/family education;Manual techniques;Passive range of motion;Dry needling;Taping;Spinal Manipulations;Joint Manipulations    PT Next Visit Plan unilateral UE exercises, if still visible L UE compensation; postural awareness and stretching; postural strengthening; MT to suboccipitals, UT, LS, cervical paraspinals, rhomboids and pecs with possible DN to UT/LS    PT Home Exercise Plan Access Code: [email or web address])    Consulted and Agree with Plan of Care Patient             Patient will benefit from skilled therapeutic intervention in order to improve the following deficits and impairments:  Decreased activity tolerance, Decreased endurance, Decreased knowledge of precautions, Decreased mobility, Decreased range of motion, Decreased strength, Increased fascial restricitons, Increased muscle spasms, Impaired perceived functional ability, Impaired flexibility, Impaired UE functional use, Improper body mechanics, Postural dysfunction, Pain, Decreased balance  Visit Diagnosis: Cervicalgia  Abnormal posture  Muscle weakness (generalized)  Other  symptoms and signs involving the musculoskeletal system  Other muscle spasm     Problem List Patient Active Problem List   Diagnosis Date Noted   Lumbar radiculopathy 09/09/2021   Cervical cancer screening 03/07/2021   Allergic conjunctivitis 04/08/2020   Neck pain 02/06/2020   Hip pain, right 09/30/2019   COVID-19 09/30/2019   Educated about COVID-19 virus infection 12/05/2018   High risk heterosexual behavior 11/20/2017   Baker's cyst of knee, right 02/16/2017   Insomnia 07/23/2016   Hypokalemia 07/23/2016   Controlled type 2 diabetes mellitus without complication, without long-term current use of insulin [place] 01/24/2016   Preventative health care 01/18/2016   Left-sided low back pain with left-sided sciatica 10/07/2015   Obesity (BMI 30-39.9) 06/05/2015   OSA (obstructive sleep apnea) 02/02/2015   Palpitations 09/19/2014   Plantar fasciitis, bilateral 11/19/2013   Pronation deformity of ankle, acquired 11/19/2013   DJD (degenerative joint disease) 08/26/2013   Fibrocystic breast 08/26/2013   HTN (hypertension) 08/26/2013   GERD (gastroesophageal reflux disease) 08/26/2013   S/P hysterectomy with oophorectomy 08/26/2013   Hypothyroidism 08/26/2013   Chronic hoarseness 08/26/2013   Colon polyp 08/26/2013   Hiatal hernia 08/26/2013   Irritable bowel syndrome 08/26/2013   Moderate persistent asthma 08/26/2013   Allergic rhinoconjunctivitis 08/26/2013   Hyperlipidemia 08/22/2013   Migraine 08/22/2013   [place] 08/22/2013    BArtist Pais PTA 09/13/2021, 5:24 PM  [facility] [address] [place] [address]Phone: [phone]  Fax:  [phone] Name: Monica JOLLYMRN: [id number] of Birth: [birth year]

## 2021-09-16 ENCOUNTER — Ambulatory Visit: Payer: Medicare Other

## 2021-09-16 DIAGNOSIS — M170 Bilateral primary osteoarthritis of knee: Secondary | ICD-10-CM | POA: Diagnosis not present

## 2021-09-16 NOTE — Chronic Care Management (AMB) (Signed)
?  Care Management  ? ?Note ? ?09/16/2021 ?Name: Monica Wallace MRN: [id number] DOB: September 29, 1950 ? ?Monica Wallace is a 71 y.o. year old female who is a primary care patient of [name], MD and is actively engaged with the care management team. I reached out to Clemens Catholic by phone today to assist with re-scheduling a follow up visit with the RN Case Manager ? ?Follow up plan: ?Telephone appointment with care management team member scheduled for: 09/28/2021 ? ?Dimples Probus, CCMA ?Care Guide, Embedded Care Coordination ?[place]  Care Management  ?Direct Dial: [phone] ? ? ?

## 2021-09-17 MED ORDER — ALPRAZOLAM 0.5 MG PO TABS: ORAL_TABLET | ORAL | 0 refills | Status: DC

## 2021-09-17 MED ORDER — TRIAMTERENE-HCTZ 37.5-25 MG PO TABS: 0.5000 | ORAL_TABLET | Freq: Every day | ORAL | 0 refills | Status: DC

## 2021-09-17 NOTE — Addendum Note (Signed)
Addended by: Debbrah Alar on: 09/17/2021 11:27 AM ? ? Modules accepted: Orders ? ?

## 2021-09-17 NOTE — Addendum Note (Signed)
Addended by: Debbrah Alar on: 09/17/2021 10:44 AM ? ? Modules accepted: Orders ? ?

## 2021-09-18 ENCOUNTER — Other Ambulatory Visit: Payer: Self-pay

## 2021-09-18 ENCOUNTER — Ambulatory Visit (HOSPITAL_BASED_OUTPATIENT_CLINIC_OR_DEPARTMENT_OTHER)
Admission: RE | Admit: 2021-09-18 | Discharge: 2021-09-18 | Disposition: A | Discharge status: Home / Self Care | Payer: Medicare Other | Source: Ambulatory Visit | Attending: Family | Admitting: Family

## 2021-09-18 DIAGNOSIS — M5416 Radiculopathy, lumbar region: Secondary | ICD-10-CM | POA: Insufficient documentation

## 2021-09-18 DIAGNOSIS — M545 Low back pain, unspecified: Secondary | ICD-10-CM | POA: Diagnosis not present

## 2021-09-18 DIAGNOSIS — M4316 Spondylolisthesis, lumbar region: Secondary | ICD-10-CM | POA: Diagnosis not present

## 2021-09-20 ENCOUNTER — Ambulatory Visit: Payer: Medicare Other | Attending: Family Medicine | Admitting: Physical Therapy

## 2021-09-20 ENCOUNTER — Encounter: Payer: Self-pay | Admitting: Physical Therapy

## 2021-09-20 ENCOUNTER — Telehealth: Payer: Self-pay | Admitting: Family

## 2021-09-20 ENCOUNTER — Other Ambulatory Visit: Payer: Self-pay

## 2021-09-20 DIAGNOSIS — M62838 Other muscle spasm: Secondary | ICD-10-CM | POA: Diagnosis not present

## 2021-09-20 DIAGNOSIS — R29898 Other symptoms and signs involving the musculoskeletal system: Secondary | ICD-10-CM | POA: Insufficient documentation

## 2021-09-20 DIAGNOSIS — M6281 Muscle weakness (generalized): Secondary | ICD-10-CM | POA: Diagnosis not present

## 2021-09-20 DIAGNOSIS — R293 Abnormal posture: Secondary | ICD-10-CM | POA: Diagnosis not present

## 2021-09-20 DIAGNOSIS — M542 Cervicalgia: Secondary | ICD-10-CM | POA: Diagnosis not present

## 2021-09-20 DIAGNOSIS — G8929 Other chronic pain: Secondary | ICD-10-CM | POA: Diagnosis not present

## 2021-09-20 NOTE — Therapy (Signed)
[facility] [address]  [place], [place], [address] Phone: [phone]   Fax:  [phone]  Physical Therapy Treatment  Patient Details  Name: Monica Wallace MRN: [id number] Date of Birth: 1950-11-24 Referring Provider (PT): [name], MD   Encounter Date: 09/20/2021   PT End of Session - 09/20/21 1621     Visit Number 4    Number of Visits 12    Date for PT Re-Evaluation 10/12/21    Authorization Type Medicare & AARP    PT Start Time 0102   Pt arrived late   PT Stop Time 1700    PT Time Calculation (min) 39 min    Activity Tolerance Patient tolerated treatment well    Behavior During Therapy [facility] for tasks assessed/performed             Past Medical History:  Diagnosis Date   Arthritis    Asthma    Baker's cyst of knee, right 02/16/2017   Cancer ([place]) 2002   thyroid (2002)   Cystic breast    Diabetes ([place])    GERD (gastroesophageal reflux disease)    Hepatic cyst 02/02/2015   Hyperlipidemia    Hypertension    Insomnia 07/23/2016   Menopause    Migraine    Palpitations    Preventative health care 01/18/2016   Sleep apnea 02/02/2015   Thyroid disease     Past Surgical History:  Procedure Laterality Date   ABDOMINAL HYSTERECTOMY     took right ovary and uterus   APPENDECTOMY     CARPAL TUNNEL RELEASE Right    CESAREAN SECTION     CHOLECYSTECTOMY     EYE SURGERY Bilateral 2017   cataracts   KNEE ARTHROPLASTY     SHOULDER ARTHROSCOPY W/ ROTATOR CUFF REPAIR Left    THYROIDECTOMY  2002    There were no vitals filed for this visit.   Subjective Assessment - 09/20/21 1625     Subjective Pt notes benefit from the DN last visit but still noting some pain behind her R ear, although less intense than normal.    Diagnostic tests 07/31/21 - cervical MRI: 1. Unchanged examination of the cervical spine (from 02/22/20 MRI) with moderate right C3-4 and mild right C4-5 and left C6-7 neural  foraminal stenosis.  2. No spinal canal stenosis.   Multiple head CTs along with MRI & MRA of neck brain from 04/2021 through 06/2021 - all negative with no abnormalities noted.    Patient Stated Goals "less, if not no pain in my neck and improved strength in R side as well as improve my balance"    Currently in Pain? Yes    Pain Score 3     Pain Location Neck   "behind my ear"   Pain Orientation Right    Pain Descriptors / Indicators Dull    Pain Type Chronic pain    Pain Frequency Intermittent                               [facility] Adult PT Treatment/Exercise - 09/20/21 1621       Neck Exercises: Machines for Strengthening   UBE (Upper Arm Bike) L1.5 x 6 min (3 min fwd/3 min back)      Neck Exercises: Seated   Cervical Rotation Right;Left;10 reps    Cervical Rotation Limitations SNAGs with pillowcase    Other Seated Exercise SNAGs  into extension 10x3"      Manual Therapy   Manual Therapy Soft tissue mobilization;Myofascial release    Manual therapy comments skilled palpation and monitoring of soft tissue during DN    Soft tissue mobilization STM to B UT, LS, scalenes and cervical paraspinals    Myofascial Release pin and stretch to B UT, LS, scalenes and cervical paraspinals              Trigger Point Dry Needling - 09/20/21 1621     Consent Given? Yes    Muscles Treated Head and Neck Scalenes;Splenius capitus;Semispinalis capitus;Cervical multifidi    Scalenes Response Twitch reponse elicited;Palpable increased muscle length   bilateral   Splenius capitus Response Twitch reponse elicited;Palpable increased muscle length   right   Semispinalis capitus Response Twitch reponse elicited;Palpable increased muscle length   right   Cervical multifidi Response --   right                    PT Short Term Goals - 09/13/21 1712       PT SHORT TERM GOAL #1   Title Patient will be independent with initial HEP    Status Achieved   09/13/21   Target  Date 09/14/21               PT Long Term Goals - 09/08/21 1632       PT LONG TERM GOAL #1   Title Patient will be independent with ongoing/advanced HEP for self-management at home    Status On-going    Target Date 10/12/21      PT LONG TERM GOAL #2   Title Improve posture and alignment with patient to demonstrate improved upright posture with posterior shoulder girdle engaged    Status On-going    Target Date 10/12/21      PT LONG TERM GOAL #3   Title Decrease R sided neck pain by >/= 50-75% allowing patient increased ease of cervical ROM and improved comfort in preferred sleeping position    Status On-going    Target Date 10/12/21      PT LONG TERM GOAL #4   Title Patient to improve cervical AROM to [facility] without pain provocation    Status On-going    Target Date 10/12/21      PT LONG TERM GOAL #5   Title Patient will demonstrate improved B shoulder strength to >/= 4+/5 for functional UE use    Status On-going    Target Date 10/12/21      PT LONG TERM GOAL #6   Title Patient will report no sleep disturbance due to R sided neck pain    Status On-going    Target Date 10/12/21                   Plan - 09/20/21 1629     Clinical Impression Statement Brunette reports some relief from DN but still has some pain near the R mastoid, although no pain or significant tightness noted along the R SCM but some tightness noted in cervical paraspinals and suboccipitals. Addressed cervical paraspinal tightness with MT including DN but deferred DN to suboccipitals today as pt denying any HA after explanation that DN to suboccipitals can induce a HA. Pt noting DN today seemed to help with the pain near the mastoid. DN followed up with review of cervical SNAGs for rotation and extension to promote increased vertebral mobility for increased ROM - HEP updated to include SNAGs.  Comorbidities HTN, migraine, asthma, sinusitis, pre-diabetes/DM-II, GERD, OSA, thyroid cancer s/p  thyroidectomy 2002, LBP with L sciatica, DJD, OA, insomnia, R CTR    Rehab Potential Good    PT Frequency 2x / week    PT Duration 6 weeks    PT Treatment/Interventions ADLs/Self Care Home Management;Cryotherapy;Electrical Stimulation;Iontophoresis '4mg'$ /ml Dexamethasone;Moist Heat;Traction;Ultrasound;Functional mobility training;Therapeutic activities;Therapeutic exercise;Balance training;Neuromuscular re-education;Patient/family education;Manual techniques;Passive range of motion;Dry needling;Taping;Spinal Manipulations;Joint Manipulations    PT Next Visit Plan unilateral UE exercises, if still visible L UE compensation; postural awareness and stretching; postural strengthening; MT to suboccipitals, UT, LS, cervical paraspinals, rhomboids and pecs with possible DN to UT/LS    PT Home Exercise Plan Access Code: [email or web address] (2/14)    Consulted and Agree with Plan of Care Patient             Patient will benefit from skilled therapeutic intervention in order to improve the following deficits and impairments:  Decreased activity tolerance, Decreased endurance, Decreased knowledge of precautions, Decreased mobility, Decreased range of motion, Decreased strength, Increased fascial restricitons, Increased muscle spasms, Impaired perceived functional ability, Impaired flexibility, Impaired UE functional use, Improper body mechanics, Postural dysfunction, Pain, Decreased balance  Visit Diagnosis: Cervicalgia  Abnormal posture  Muscle weakness (generalized)  Other symptoms and signs involving the musculoskeletal system  Other muscle spasm     Problem List Patient Active Problem List   Diagnosis Date Noted   Lumbar radiculopathy 09/09/2021   Cervical cancer screening 03/07/2021   Allergic conjunctivitis 04/08/2020   Neck pain 02/06/2020   Hip pain, right 09/30/2019   COVID-19 09/30/2019   Educated about COVID-19 virus infection 12/05/2018   High risk heterosexual behavior 11/20/2017    Baker's cyst of knee, right 02/16/2017   Insomnia 07/23/2016   Hypokalemia 07/23/2016   Controlled type 2 diabetes mellitus without complication, without long-term current use of insulin ([place]) 01/24/2016   Preventative health care 01/18/2016   Left-sided low back pain with left-sided sciatica 10/07/2015   Obesity (BMI 30-39.9) 06/05/2015   OSA (obstructive sleep apnea) 02/02/2015   Palpitations 09/19/2014   Plantar fasciitis, bilateral 11/19/2013   Pronation deformity of ankle, acquired 11/19/2013   DJD (degenerative joint disease) 08/26/2013   Fibrocystic breast 08/26/2013   HTN (hypertension) 08/26/2013   GERD (gastroesophageal reflux disease) 08/26/2013   S/P hysterectomy with oophorectomy 08/26/2013   Hypothyroidism 08/26/2013   Chronic hoarseness 08/26/2013   Colon polyp 08/26/2013   Hiatal hernia 08/26/2013   Irritable bowel syndrome 08/26/2013   Moderate persistent asthma 08/26/2013   Allergic rhinoconjunctivitis 08/26/2013   Hyperlipidemia 08/22/2013   Migraine 08/22/2013   Thyroid cancer ([place]) 08/22/2013    Percival Spanish, PT 09/20/2021, 5:26 PM  [facility] [address]  [place], [place], [address] Phone: [phone]   Fax:  [phone]  Name: MOZELLA REXRODE MRN: [id number] Date of Birth: [birth year]

## 2021-09-20 NOTE — Telephone Encounter (Signed)
See mychart.  

## 2021-09-20 NOTE — Patient Instructions (Signed)
? ?  Access Code: [email or web address] ?URL: [email or web address] ?Date: 09/20/2021 ?Prepared by: Annie Paras ? ?Exercises ?Seated Cervical Retraction - 2-3 x daily - 7 x weekly - 2 sets - 10 reps - 3-5 sec hold ?Seated Scapular Retraction - 2-3 x daily - 7 x weekly - 2 sets - 10 reps - 5 sec hold ?Seated Gentle Upper Trapezius Stretch - 2-3 x daily - 7 x weekly - 3 reps - 30 sec hold ?Gentle Levator Scapulae Stretch - 2-3 x daily - 7 x weekly - 3 reps - 30 sec hold ?Doorway Pec Stretch at 60 Degrees Abduction with Arm Straight - 2-3 x daily - 7 x weekly - 3 reps - 30 sec hold ?Doorway Pec Stretch at 90 Elevation with Arm Straight - 2-3 x daily - 7 x weekly - 3 reps - 30 sec hold ?Standing Bilateral Low Shoulder Row with Anchored Resistance - 1 x daily - 7 x weekly - 3 sets - 10 reps ?Shoulder Extension with Resistance - 1 x daily - 7 x weekly - 3 sets - 10 reps ?Upper Cervical Extension SNAG with Strap - 1 x daily - 7 x weekly - 2 sets - 10 reps - 3 sec hold ?Seated Assisted Cervical Rotation with Towel - 1 x daily - 7 x weekly - 2 sets - 10 reps - 3 sec hold ? ?

## 2021-09-23 ENCOUNTER — Other Ambulatory Visit: Payer: Self-pay

## 2021-09-23 ENCOUNTER — Ambulatory Visit: Payer: Medicare Other

## 2021-09-23 DIAGNOSIS — G8929 Other chronic pain: Secondary | ICD-10-CM | POA: Diagnosis not present

## 2021-09-23 DIAGNOSIS — M542 Cervicalgia: Secondary | ICD-10-CM

## 2021-09-23 DIAGNOSIS — M62838 Other muscle spasm: Secondary | ICD-10-CM

## 2021-09-23 DIAGNOSIS — M6281 Muscle weakness (generalized): Secondary | ICD-10-CM | POA: Diagnosis not present

## 2021-09-23 DIAGNOSIS — R293 Abnormal posture: Secondary | ICD-10-CM

## 2021-09-23 DIAGNOSIS — R29898 Other symptoms and signs involving the musculoskeletal system: Secondary | ICD-10-CM | POA: Diagnosis not present

## 2021-09-23 MED ORDER — TRIAMTERENE-HCTZ 37.5-25 MG PO TABS: 1.0000 | ORAL_TABLET | Freq: Every day | ORAL | 0 refills | Status: DC

## 2021-09-23 NOTE — Addendum Note (Signed)
Addended by: Debbrah Alar on: 09/23/2021 09:18 AM ? ? Modules accepted: Orders ? ?

## 2021-09-23 NOTE — Therapy (Signed)
[facility] [address]  [place], [place], [address] Phone: [phone]   Fax:  [phone]  Physical Therapy Treatment  Patient Details  Name: Monica Wallace MRN: [id number] Date of Birth: 04-23-1951 Referring Provider (PT): [name], MD   Encounter Date: 09/23/2021   PT End of Session - 09/23/21 1659     Visit Number 5    Number of Visits 12    Date for PT Re-Evaluation 10/12/21    Authorization Type Medicare & AARP    PT Start Time 1620   pt late   PT Stop Time 1658    PT Time Calculation (min) 38 min    Activity Tolerance Patient tolerated treatment well    Behavior During Therapy [facility] for tasks assessed/performed             Past Medical History:  Diagnosis Date   Arthritis    Asthma    Baker's cyst of knee, right 02/16/2017   Cancer ([place]) 2002   thyroid (2002)   Cystic breast    Diabetes ([place])    GERD (gastroesophageal reflux disease)    Hepatic cyst 02/02/2015   Hyperlipidemia    Hypertension    Insomnia 07/23/2016   Menopause    Migraine    Palpitations    Preventative health care 01/18/2016   Sleep apnea 02/02/2015   Thyroid disease     Past Surgical History:  Procedure Laterality Date   ABDOMINAL HYSTERECTOMY     took right ovary and uterus   APPENDECTOMY     CARPAL TUNNEL RELEASE Right    CESAREAN SECTION     CHOLECYSTECTOMY     EYE SURGERY Bilateral 2017   cataracts   KNEE ARTHROPLASTY     SHOULDER ARTHROSCOPY W/ ROTATOR CUFF REPAIR Left    THYROIDECTOMY  2002    There were no vitals filed for this visit.   Subjective Assessment - 09/23/21 1623     Subjective Pt reports feeling like something was pinching along the UT/LS area after the last DN session but that it subsided after exercises.    Diagnostic tests 07/31/21 - cervical MRI: 1. Unchanged examination of the cervical spine (from 02/22/20 MRI) with moderate right C3-4 and mild right C4-5 and left C6-7 neural  foraminal stenosis.  2. No spinal canal stenosis.   Multiple head CTs along with MRI & MRA of neck brain from 04/2021 through 06/2021 - all negative with no abnormalities noted.    Patient Stated Goals "less, if not no pain in my neck and improved strength in R side as well as improve my balance"    Currently in Pain? Yes    Pain Score 2     Pain Location Neck    Pain Orientation Right    Pain Descriptors / Indicators Dull    Pain Type Chronic pain                               [facility] Adult PT Treatment/Exercise - 09/23/21 0001       Neck Exercises: Machines for Strengthening   UBE (Upper Arm Bike) L2.0 x 6 min (3 min fwd/3 min back)      Neck Exercises: Theraband   Shoulder Extension 15 reps;Green    Shoulder Extension Limitations cues to keep scapulae depressed    Rows Green;15 reps      Neck Exercises: Seated  Cervical Rotation Right;Left;10 reps    Cervical Rotation Limitations SNAGs with pillowcase, 5 sec hold    Shoulder Rolls Backwards;20 reps    Other Seated Exercise SNAGs into extension 10x5"    Other Seated Exercise thoracic extension over chair 10x3"      Neck Exercises: Stretches   Other Neck Stretches SCM stretch R side 2x30 sec      Shoulder Exercises: Standing   Row Strengthening;Both;12 reps;Theraband    Theraband Level (Shoulder Row) Level 3 (Green)    Row Limitations high row    Other Standing Exercises scapular depressions 20x5 sec hold      Shoulder Exercises: ROM/Strengthening   Lat Pull Limitations 15# 20 reps, wide grip    Other ROM/Strengthening Exercises seated isometric scapular depression pressing into mat 10x                       PT Short Term Goals - 09/13/21 1712       PT SHORT TERM GOAL #1   Title Patient will be independent with initial HEP    Status Achieved   09/13/21   Target Date 09/14/21               PT Long Term Goals - 09/08/21 1632       PT LONG TERM GOAL #1   Title Patient will be  independent with ongoing/advanced HEP for self-management at home    Status On-going    Target Date 10/12/21      PT LONG TERM GOAL #2   Title Improve posture and alignment with patient to demonstrate improved upright posture with posterior shoulder girdle engaged    Status On-going    Target Date 10/12/21      PT LONG TERM GOAL #3   Title Decrease R sided neck pain by >/= 50-75% allowing patient increased ease of cervical ROM and improved comfort in preferred sleeping position    Status On-going    Target Date 10/12/21      PT LONG TERM GOAL #4   Title Patient to improve cervical AROM to [facility] without pain provocation    Status On-going    Target Date 10/12/21      PT LONG TERM GOAL #5   Title Patient will demonstrate improved B shoulder strength to >/= 4+/5 for functional UE use    Status On-going    Target Date 10/12/21      PT LONG TERM GOAL #6   Title Patient will report no sleep disturbance due to R sided neck pain    Status On-going    Target Date 10/12/21                   Plan - 09/23/21 1701     Clinical Impression Statement Pt requested to review SNAG exercises to start session. Only mild clarification required with these exercises. Pt demonstrates continued over-engagement of UT muscles, after lots of cues and feedback for lower trap engagement we shifted focus on retraining the scapular depressors. With supervision and instruction she demonstrated a good performance of the exercises, no increased pain.    Personal Factors and Comorbidities Age;Behavior Pattern;Comorbidity 3+;Fitness;Profession;Past/Current Experience;Time since onset of injury/illness/exacerbation    Comorbidities HTN, migraine, asthma, sinusitis, pre-diabetes/DM-II, GERD, OSA, thyroid cancer s/p thyroidectomy 2002, LBP with L sciatica, DJD, OA, insomnia, R CTR    PT Frequency 2x / week    PT Duration 6 weeks    PT Treatment/Interventions ADLs/Self Care Home Management;Cryotherapy;Dealer  Stimulation;Iontophoresis '4mg'$ /ml Dexamethasone;Moist Heat;Traction;Ultrasound;Functional mobility training;Therapeutic activities;Therapeutic exercise;Balance training;Neuromuscular re-education;Patient/family education;Manual techniques;Passive range of motion;Dry needling;Taping;Spinal Manipulations;Joint Manipulations    PT Next Visit Plan unilateral UE exercises, if still visible L UE compensation; postural awareness and stretching; postural strengthening; MT to suboccipitals, UT, LS, cervical paraspinals, rhomboids and pecs with possible DN to UT/LS    PT Home Exercise Plan Access Code: [email or web address])    Consulted and Agree with Plan of Care Patient             Patient will benefit from skilled therapeutic intervention in order to improve the following deficits and impairments:  Decreased activity tolerance, Decreased endurance, Decreased knowledge of precautions, Decreased mobility, Decreased range of motion, Decreased strength, Increased fascial restricitons, Increased muscle spasms, Impaired perceived functional ability, Impaired flexibility, Impaired UE functional use, Improper body mechanics, Postural dysfunction, Pain, Decreased balance  Visit Diagnosis: Cervicalgia  Abnormal posture  Muscle weakness (generalized)  Other symptoms and signs involving the musculoskeletal system  Other muscle spasm     Problem List Patient Active Problem List   Diagnosis Date Noted   Lumbar radiculopathy 09/09/2021   Cervical cancer screening 03/07/2021   Allergic conjunctivitis 04/08/2020   Neck pain 02/06/2020   Hip pain, right 09/30/2019   COVID-19 09/30/2019   Educated about COVID-19 virus infection 12/05/2018   High risk heterosexual behavior 11/20/2017   Baker's cyst of knee, right 02/16/2017   Insomnia 07/23/2016   Hypokalemia 07/23/2016   Controlled type 2 diabetes mellitus without complication, without long-term current use of insulin ([place]) 01/24/2016   Preventative health  care 01/18/2016   Left-sided low back pain with left-sided sciatica 10/07/2015   Obesity (BMI 30-39.9) 06/05/2015   OSA (obstructive sleep apnea) 02/02/2015   Palpitations 09/19/2014   Plantar fasciitis, bilateral 11/19/2013   Pronation deformity of ankle, acquired 11/19/2013   DJD (degenerative joint disease) 08/26/2013   Fibrocystic breast 08/26/2013   HTN (hypertension) 08/26/2013   GERD (gastroesophageal reflux disease) 08/26/2013   S/P hysterectomy with oophorectomy 08/26/2013   Hypothyroidism 08/26/2013   Chronic hoarseness 08/26/2013   Colon polyp 08/26/2013   Hiatal hernia 08/26/2013   Irritable bowel syndrome 08/26/2013   Moderate persistent asthma 08/26/2013   Allergic rhinoconjunctivitis 08/26/2013   Hyperlipidemia 08/22/2013   Migraine 08/22/2013   Thyroid cancer ([place]) 08/22/2013    Artist Pais, PTA 09/23/2021, 5:52 PM  [facility] [address]  [place], [place], [address] Phone: [phone]   Fax:  [phone]  Name: Monica Wallace MRN: [id number] Date of Birth: Jul 17, 1951

## 2021-09-27 ENCOUNTER — Other Ambulatory Visit: Payer: Self-pay

## 2021-09-27 ENCOUNTER — Ambulatory Visit: Payer: Medicare Other | Admitting: Physical Therapy

## 2021-09-27 ENCOUNTER — Encounter: Payer: Self-pay | Admitting: Physical Therapy

## 2021-09-27 DIAGNOSIS — R29898 Other symptoms and signs involving the musculoskeletal system: Secondary | ICD-10-CM

## 2021-09-27 DIAGNOSIS — M542 Cervicalgia: Secondary | ICD-10-CM

## 2021-09-27 DIAGNOSIS — M6281 Muscle weakness (generalized): Secondary | ICD-10-CM | POA: Diagnosis not present

## 2021-09-27 DIAGNOSIS — M62838 Other muscle spasm: Secondary | ICD-10-CM | POA: Diagnosis not present

## 2021-09-27 DIAGNOSIS — R293 Abnormal posture: Secondary | ICD-10-CM | POA: Diagnosis not present

## 2021-09-27 DIAGNOSIS — G8929 Other chronic pain: Secondary | ICD-10-CM | POA: Diagnosis not present

## 2021-09-27 NOTE — Therapy (Signed)
[facility] [address]  [place], [place], [address] Phone: [phone]   Fax:  [phone]  Physical Therapy Treatment  Patient Details  Name: Monica Wallace MRN: [id number] Date of Birth: 04/30/1951 Referring Provider (PT): [name], MD   Encounter Date: 09/27/2021   PT End of Session - 09/27/21 1619     Visit Number 6    Number of Visits 12    Date for PT Re-Evaluation 10/12/21    Authorization Type Medicare & AARP    PT Start Time 1619   Pt arrived late   PT Stop Time 1659    PT Time Calculation (min) 40 min    Activity Tolerance Patient tolerated treatment well    Behavior During Therapy [facility] for tasks assessed/performed             Past Medical History:  Diagnosis Date   Arthritis    Asthma    Baker's cyst of knee, right 02/16/2017   Cancer ([place]) 2002   thyroid (2002)   Cystic breast    Diabetes ([place])    GERD (gastroesophageal reflux disease)    Hepatic cyst 02/02/2015   Hyperlipidemia    Hypertension    Insomnia 07/23/2016   Menopause    Migraine    Palpitations    Preventative health care 01/18/2016   Sleep apnea 02/02/2015   Thyroid disease     Past Surgical History:  Procedure Laterality Date   ABDOMINAL HYSTERECTOMY     took right ovary and uterus   APPENDECTOMY     CARPAL TUNNEL RELEASE Right    CESAREAN SECTION     CHOLECYSTECTOMY     EYE SURGERY Bilateral 2017   cataracts   KNEE ARTHROPLASTY     SHOULDER ARTHROSCOPY W/ ROTATOR CUFF REPAIR Left    THYROIDECTOMY  2002    There were no vitals filed for this visit.   Subjective Assessment - 09/27/21 1622     Subjective Pt reports pain very, very mild today.    Diagnostic tests 07/31/21 - cervical MRI: 1. Unchanged examination of the cervical spine (from 02/22/20 MRI) with moderate right C3-4 and mild right C4-5 and left C6-7 neural foraminal stenosis.  2. No spinal canal stenosis.   Multiple head CTs along with MRI &  MRA of neck brain from 04/2021 through 06/2021 - all negative with no abnormalities noted.    Patient Stated Goals "less, if not no pain in my neck and improved strength in R side as well as improve my balance"    Currently in Pain? Yes    Pain Score 1     Pain Location Neck    Pain Orientation Right    Pain Descriptors / Indicators Dull    Pain Type Chronic pain                               [facility] Adult PT Treatment/Exercise - 09/27/21 1619       Neck Exercises: Machines for Strengthening   Nustep L4 x 6 min      Neck Exercises: Standing   Wall Push Ups --   7 reps   Upper Extremity D1 Extension;20 reps;Theraband    Theraband Level (UE D1) Level 2 (Red)    Other Standing Exercises Serratus rollups on 6" FR x 10    Other Standing Exercises R/L 3-way serratus clocks with red TB at forerams  x 5 cycles      Shoulder Exercises: Standing   Extension Both;20 reps;Strengthening;Theraband    Theraband Level (Shoulder Extension) Level 2 (Red)    Extension Limitations + slight long-arm ER to increase scapular activation    Other Standing Exercises R/L red TB scapular depressions 20x5 sec hold      Shoulder Exercises: ROM/Strengthening   Lat Pull 20 reps    Lat Pull Limitations 15#, wide grip    Cybex Row 10 reps   2 sets   Cybex Row Limitations 15#, 1 set each at low & high grips                       PT Short Term Goals - 09/13/21 1712       PT SHORT TERM GOAL #1   Title Patient will be independent with initial HEP    Status Achieved   09/13/21   Target Date 09/14/21               PT Long Term Goals - 09/27/21 1624       PT LONG TERM GOAL #1   Title Patient will be independent with ongoing/advanced HEP for self-management at home    Status On-going    Target Date 10/12/21      PT LONG TERM GOAL #2   Title Improve posture and alignment with patient to demonstrate improved upright posture with posterior shoulder girdle engaged     Status Partially Met    Target Date 10/12/21      PT LONG TERM GOAL #3   Title Decrease R sided neck pain by >/= 50-75% allowing patient increased ease of cervical ROM and improved comfort in preferred sleeping position    Status On-going   09/27/21 - Pt reports pain has improved by 30%, with some days better than that   Target Date 10/12/21      PT LONG TERM GOAL #4   Title Patient to improve cervical AROM to [facility] without pain provocation    Status On-going    Target Date 10/12/21      PT LONG TERM GOAL #5   Title Patient will demonstrate improved B shoulder strength to >/= 4+/5 for functional UE use    Status On-going    Target Date 10/12/21      PT LONG TERM GOAL #6   Title Patient will report no sleep disturbance due to R sided neck pain    Status Partially Met   09/27/21 - Pt reports no sleep distubance due to pain but sometimes wakes up with some discomfort potentially due to how she slept   Target Date 10/12/21                   Plan - 09/27/21 1659     Clinical Impression Statement Arnitra reports 30% reduction in overall pain but notes some days are better than that with only very, very mild pain reported today. TE focusing on postural strengthening with emphasis on scapular control especially into retraction and depression. Pt reporting good tolerance for exercises with no increased pain, therefore HEP updated to progress home strengthening. Megin is progressing well toward her LTGs.    Comorbidities HTN, migraine, asthma, sinusitis, pre-diabetes/DM-II, GERD, OSA, thyroid cancer s/p thyroidectomy 2002, LBP with L sciatica, DJD, OA, insomnia, R CTR    Rehab Potential Good    PT Frequency 2x / week    PT Duration 6 weeks    PT Treatment/Interventions  ADLs/Self Care Home Management;Cryotherapy;Electrical Stimulation;Iontophoresis 69m/ml Dexamethasone;Moist Heat;Traction;Ultrasound;Functional mobility training;Therapeutic activities;Therapeutic exercise;Balance  training;Neuromuscular re-education;Patient/family education;Manual techniques;Passive range of motion;Dry needling;Taping;Spinal Manipulations;Joint Manipulations    PT Next Visit Plan postural awareness and stretching; postural strengthening; MT to suboccipitals, UT, LS, cervical paraspinals, rhomboids and pecs with possible DN to UT/LS    PT Home Exercise Plan Access Code: [email or web address](2/[email or web address])    Consulted and Agree with Plan of Care Patient             Patient will benefit from skilled therapeutic intervention in order to improve the following deficits and impairments:  Decreased activity tolerance, Decreased endurance, Decreased knowledge of precautions, Decreased mobility, Decreased range of motion, Decreased strength, Increased fascial restricitons, Increased muscle spasms, Impaired perceived functional ability, Impaired flexibility, Impaired UE functional use, Improper body mechanics, Postural dysfunction, Pain, Decreased balance  Visit Diagnosis: Cervicalgia  Abnormal posture  Muscle weakness (generalized)  Other symptoms and signs involving the musculoskeletal system  Other muscle spasm     Problem List Patient Active Problem List   Diagnosis Date Noted   Lumbar radiculopathy 09/09/2021   Cervical cancer screening 03/07/2021   Allergic conjunctivitis 04/08/2020   Neck pain 02/06/2020   Hip pain, right 09/30/2019   COVID-19 09/30/2019   Educated about COVID-19 virus infection 12/05/2018   High risk heterosexual behavior 11/20/2017   Baker's cyst of knee, right 02/16/2017   Insomnia 07/23/2016   Hypokalemia 07/23/2016   Controlled type 2 diabetes mellitus without complication, without long-term current use of insulin ([place] 01/24/2016   Preventative health care 01/18/2016   Left-sided low back pain with left-sided sciatica 10/07/2015   Obesity (BMI 30-39.9) 06/05/2015   OSA (obstructive sleep apnea) 02/02/2015   Palpitations 09/19/2014   Plantar fasciitis, bilateral  11/19/2013   Pronation deformity of ankle, acquired 11/19/2013   DJD (degenerative joint disease) 08/26/2013   Fibrocystic breast 08/26/2013   HTN (hypertension) 08/26/2013   GERD (gastroesophageal reflux disease) 08/26/2013   S/P hysterectomy with oophorectomy 08/26/2013   Hypothyroidism 08/26/2013   Chronic hoarseness 08/26/2013   Colon polyp 08/26/2013   Hiatal hernia 08/26/2013   Irritable bowel syndrome 08/26/2013   Moderate persistent asthma 08/26/2013   Allergic rhinoconjunctivitis 08/26/2013   Hyperlipidemia 08/22/2013   Migraine 08/22/2013   [place] 08/22/2013    JPercival Spanish PT 09/27/2021, [facility] [address] [place] [address]Phone: [phone]  Fax:  [phone] Name: WLENOR PROVENCHERMRN: [id number] of Birth: [birth year]

## 2021-09-27 NOTE — Patient Instructions (Signed)
? ? ?  Access Code: [email or web address] ?URL: [email or web address] ?Date: 09/27/2021 ?Prepared by: Annie Paras ? ?Exercises ?Seated Cervical Retraction - 2-3 x daily - 7 x weekly - 2 sets - 10 reps - 3-5 sec hold ?Seated Scapular Retraction - 2-3 x daily - 7 x weekly - 2 sets - 10 reps - 5 sec hold ?Seated Gentle Upper Trapezius Stretch - 2-3 x daily - 7 x weekly - 3 reps - 30 sec hold ?Gentle Levator Scapulae Stretch - 2-3 x daily - 7 x weekly - 3 reps - 30 sec hold ?Doorway Pec Stretch at 60 Degrees Abduction with Arm Straight - 2-3 x daily - 7 x weekly - 3 reps - 30 sec hold ?Doorway Pec Stretch at 90 Elevation with Arm Straight - 2-3 x daily - 7 x weekly - 3 reps - 30 sec hold ?Standing Bilateral Low Shoulder Row with Anchored Resistance - 1 x daily - 7 x weekly - 3 sets - 10 reps ?Shoulder Extension with Resistance - 1 x daily - 7 x weekly - 3 sets - 10 reps ?Upper Cervical Extension SNAG with Strap - 1 x daily - 7 x weekly - 2 sets - 10 reps - 3 sec hold ?Seated Assisted Cervical Rotation with Towel - 1 x daily - 7 x weekly - 2 sets - 10 reps - 3 sec hold ?Single Arm Scapular Depression with Anchored Resistance - Straight Arm - 1 x daily - 7 x weekly - 2 sets - 10 reps - 3 sec hold ?Standing Single Arm Shoulder PNF D1 Extension with Anchored Resistance - 1 x daily - 7 x weekly - 2 sets - 10 reps - 3 sec hold ?Scapular Retraction with Resistance Advanced - 1 x daily - 7 x weekly - 2 sets - 10 reps - 5 sec hold ? ?

## 2021-09-28 ENCOUNTER — Telehealth: Payer: Medicare Other

## 2021-09-28 NOTE — Progress Notes (Signed)
? ? ? ? ?HPI:FU presyncope. Monitor February 2019 showed normal sinus rhythm.  Symptoms of dizziness associated with sinus rhythm.  MRA of the head and neck November 2022 normal.  Patient seen previously in the emergency room for symptoms of presyncope.  Echocardiogram December 2022 showed normal LV function, basal septal hypertrophy, grade 1 diastolic dysfunction.  Since last seen, she denies dyspnea, chest pain, palpitations or pedal edema.  She has dizziness occasionally both sitting and standing.  She attributes this to sinuses.  She had transient right-sided weakness but was seen by neurology with a negative evaluation by her report. ? ?Current Outpatient Medications  ?Medication Sig Dispense Refill  ? acetaminophen (TYLENOL) 325 MG tablet Take 650 mg by mouth every 6 (six) hours as needed.    ? albuterol (VENTOLIN HFA) 108 (90 Base) MCG/ACT inhaler INHALE 2 PUFFS INTO THE LUNGS EVERY 6 HOURS AS NEEDED FOR WHEEZING OR SHORTNESS OF BREATH 25.5 g 1  ? azelastine (ASTELIN) 0.1 % nasal spray Place 1 spray in each nostril twice a day as needed for runny nose/drainage down throat (Patient taking differently: Place 1 spray into both nostrils daily.) 90 mL 1  ? Blood Glucose Monitoring Suppl ([place]) w/Device KIT Use to check blood glucose once a day.  DX code: E11.9 1 kit 0  ? budesonide (PULMICORT) 180 MCG/ACT inhaler Inhale 1-2 puffs into the lungs 2 (two) times daily. 3 each 1  ? Cholecalciferol ([employer] VIT D 5000 HIGH-POTENCY PO) Take 5,000 Units by mouth daily.    ? estradiol (ESTRACE VAGINAL) 0.1 MG/GM vaginal cream Place 1 Applicatorful vaginally at bedtime. 42.5 g 12  ? fluticasone (FLONASE) 50 MCG/ACT nasal spray SHAKE LIQUID AND USE 2 SPRAYS IN EACH NOSTRIL DAILY 48 g 1  ? glucose blood (ONETOUCH VERIO) test strip Use to check blood glucose once a day.  DX code: E11.9 100 each 1  ? hyoscyamine (LEVSIN SL) 0.125 MG SL tablet Place 1 tablet (0.125 mg total) under the tongue every 4 (four)  hours as needed. 30 tablet 1  ? Lancets (ONETOUCH DELICA PLUS [id number]) MISC Use to check blood glucose once a day.  DX code: E11.9 100 each 1  ? loratadine (CLARITIN) 10 MG tablet Take 10 mg by mouth in the morning and at bedtime.     ? montelukast (SINGULAIR) 10 MG tablet TAKE 1 TABLET(10 MG) BY MOUTH DAILY AS NEEDED (Patient taking differently: Take 10 mg by mouth at bedtime.) 90 tablet 1  ? Omega-3 1000 MG CAPS Take 1,000 mg by mouth daily.    ? omeprazole (PRILOSEC) 40 MG capsule Take 1 capsule by mouth daily.    ? rosuvastatin (CRESTOR) 5 MG tablet TAKE 1 TAKE ONE TABLET EVERY__ HOURS TAKE ONE TABLET EVERY__ HOURS TAB BY MOUTH 2 TIMES A WEEK(TUE AND SUN WEEKLY) 25 tablet 1  ? SYNTHROID 150 MCG tablet TAKE 1 TABLET(150 MCG) BY MOUTH DAILY BEFORE AND BREAKFAST (Patient taking differently: Take 150 mcg by mouth daily before breakfast.) 90 tablet 1  ? triamterene-hydrochlorothiazide (MAXZIDE-25) 37.5-25 MG tablet Take 1 tablet by mouth daily. 90 tablet 0  ? ?No current facility-administered medications for this visit.  ? ? ? ?Past Medical History:  ?Diagnosis Date  ? Arthritis   ? Asthma   ? Baker's cyst of knee, right 02/16/2017  ? Cancer [facility]) 2002  ? thyroid (2002)  ? Cystic breast   ? Diabetes ([place])   ? GERD (gastroesophageal reflux disease)   ? Hepatic cyst 02/02/2015  ?  Hyperlipidemia   ? Hypertension   ? Insomnia 07/23/2016  ? Menopause   ? Migraine   ? Palpitations   ? Preventative health care 01/18/2016  ? Sleep apnea 02/02/2015  ? Thyroid disease   ? ? ?Past Surgical History:  ?Procedure Laterality Date  ? ABDOMINAL HYSTERECTOMY    ? took right ovary and uterus  ? APPENDECTOMY    ? CARPAL TUNNEL RELEASE Right   ? CESAREAN SECTION    ? CHOLECYSTECTOMY    ? EYE SURGERY Bilateral 2017  ? cataracts  ? KNEE ARTHROPLASTY    ? SHOULDER ARTHROSCOPY W/ ROTATOR CUFF REPAIR Left   ? THYROIDECTOMY  2002  ? ? ?Social History  ? ?Socioeconomic History  ? Marital status: Divorced  ?  Spouse name: Not on file  ? Number  of children: 2  ? Years of education: Not on file  ? Highest education level: Not on file  ?Occupational History  ? Not on file  ?Tobacco Use  ? Smoking status: Never  ? Smokeless tobacco: Never  ?Vaping Use  ? Vaping Use: Never used  ?Substance and Sexual Activity  ? Alcohol use: Yes  ?  Alcohol/week: 0.0 standard drinks  ?  Comment: very rare once or twice a year  ? Drug use: No  ? Sexual activity: Yes  ?  Birth control/protection: Other-see comments, Surgical  ?  Comment:  boyfriend  ?Other Topics Concern  ? Not on file  ?Social History Narrative  ? Household: pt, daughter and sister   ? ?Social Determinants of Health  ? ?Financial Resource Strain: Low Risk   ? Difficulty of Paying Living Expenses: Not hard at all  ?Food Insecurity: No Food Insecurity  ? Worried About Charity fundraiser in the Last Year: Never true  ? Ran Out of Food in the Last Year: Never true  ?Transportation Needs: No Transportation Needs  ? Lack of Transportation (Medical): No  ? Lack of Transportation (Non-Medical): No  ?Physical Activity: Inactive  ? Days of Exercise per Week: 0 days  ? Minutes of Exercise per Session: 0 min  ?Stress: No Stress Concern Present  ? Feeling of Stress : Not at all  ?Social Connections: Moderately Isolated  ? Frequency of Communication with Friends and Family: More than three times a week  ? Frequency of Social Gatherings with Friends and Family: More than three times a week  ? Attends Religious Services: More than 4 times per year  ? Active Member of Clubs or Organizations: No  ? Attends Archivist Meetings: Never  ? Marital Status: Divorced  ?Intimate Partner Violence: Not At Risk  ? Fear of Current or Ex-Partner: No  ? Emotionally Abused: No  ? Physically Abused: No  ? Sexually Abused: No  ? ? ?Family History  ?Problem Relation Age of Onset  ? Arthritis Mother   ? Transient ischemic attack Mother   ? Hypertension Mother   ? Hyperlipidemia Mother   ? Diabetes Father   ? Heart disease Father   ?  Arthritis Father   ? Kidney disease Father   ? Hypertension Father   ? Hyperlipidemia Father   ? Cancer Sister 2  ?     pancreatic  ? Fibromyalgia Sister   ? Allergic rhinitis Sister   ? Asthma Maternal Grandfather   ?     smoker  ? Depression Maternal Grandfather   ? Dementia Paternal Grandmother   ? Diabetes Paternal Grandfather   ? Hypertension Brother   ?  Allergic rhinitis Brother   ? Scoliosis Daughter   ?     had rods placed and removed  ? Arthritis Sister   ? Gout Sister   ? GER disease Sister   ? Cancer Sister   ?     breast cancer  ? Breast cancer Sister 61  ? ? ?ROS: no fevers or chills, productive cough, hemoptysis, dysphasia, odynophagia, melena, hematochezia, dysuria, hematuria, rash, seizure activity, orthopnea, PND, pedal edema, claudication. Remaining systems are negative. ? ?Physical Exam: ?Well-developed well-nourished in no acute distress.  ?Skin is warm and dry.  ?HEENT is normal.  ?Neck is supple.  ?Chest is clear to auscultation with normal expansion.  ?Cardiovascular exam is regular rate and rhythm.  ?Abdominal exam nontender or distended. No masses palpated. ?Extremities show no edema. ?neuro grossly intact ? ?A/P ? ?1 history of presyncope-LV function is normal.  Previous monitor unrevealing.  She feels as though some of her dizziness may be related to sinuses.  We will not pursue further cardiac evaluation at this point.  Note she has not had syncope. ? ?2 hypertension-blood pressure is controlled.  We will avoid diuretics given history of possible orthostasis. ? ?3 hyperlipidemia-continue statin. ? ?4 history of palpitations-she has had no recurrent episodes. ? ?Kirk Ruths, MD ? ? ? ?

## 2021-09-30 ENCOUNTER — Ambulatory Visit: Payer: Medicare Other

## 2021-09-30 ENCOUNTER — Other Ambulatory Visit: Payer: Self-pay

## 2021-09-30 DIAGNOSIS — R29898 Other symptoms and signs involving the musculoskeletal system: Secondary | ICD-10-CM | POA: Diagnosis not present

## 2021-09-30 DIAGNOSIS — M542 Cervicalgia: Secondary | ICD-10-CM | POA: Diagnosis not present

## 2021-09-30 DIAGNOSIS — M62838 Other muscle spasm: Secondary | ICD-10-CM | POA: Diagnosis not present

## 2021-09-30 DIAGNOSIS — M6281 Muscle weakness (generalized): Secondary | ICD-10-CM | POA: Diagnosis not present

## 2021-09-30 DIAGNOSIS — G8929 Other chronic pain: Secondary | ICD-10-CM | POA: Diagnosis not present

## 2021-09-30 DIAGNOSIS — R293 Abnormal posture: Secondary | ICD-10-CM | POA: Diagnosis not present

## 2021-09-30 NOTE — Therapy (Signed)
[facility] ?[address] ?[place], [place], [address] ?Phone: [phone]   Fax:  [phone] ? ?Physical Therapy Treatment ? ?Patient Details  ?Name: Monica Wallace ?MRN: [id number] ?Date of Birth: 1951/04/03 ?Referring Provider (PT): [name], MD ? ? ?Encounter Date: 09/30/2021 ? ? PT End of Session - 09/30/21 1729   ? ? Visit Number 7   ? Number of Visits 12   ? Date for PT Re-Evaluation 10/12/21   ? Authorization Type Medicare & AARP   ? PT Start Time 1622   pt late  ? PT Stop Time 1700   ? PT Time Calculation (min) 38 min   ? Activity Tolerance Patient tolerated treatment well   ? Behavior During Therapy [facility] for tasks assessed/performed   ? ?  ?  ? ?  ? ? ?Past Medical History:  ?Diagnosis Date  ? Arthritis   ? Asthma   ? Baker's cyst of knee, right 02/16/2017  ? Cancer [facility]) 2002  ? thyroid (2002)  ? Cystic breast   ? Diabetes ([place])   ? GERD (gastroesophageal reflux disease)   ? Hepatic cyst 02/02/2015  ? Hyperlipidemia   ? Hypertension   ? Insomnia 07/23/2016  ? Menopause   ? Migraine   ? Palpitations   ? Preventative health care 01/18/2016  ? Sleep apnea 02/02/2015  ? Thyroid disease   ? ? ?Past Surgical History:  ?Procedure Laterality Date  ? ABDOMINAL HYSTERECTOMY    ? took right ovary and uterus  ? APPENDECTOMY    ? CARPAL TUNNEL RELEASE Right   ? CESAREAN SECTION    ? CHOLECYSTECTOMY    ? EYE SURGERY Bilateral 2017  ? cataracts  ? KNEE ARTHROPLASTY    ? SHOULDER ARTHROSCOPY W/ ROTATOR CUFF REPAIR Left   ? THYROIDECTOMY  2002  ? ? ?There were no vitals filed for this visit. ? ? Subjective Assessment - 09/30/21 1627   ? ? Subjective Pt reports having a mild headache today believes it's from sinus pressure. The neck is hurting less now.   ? Diagnostic tests 07/31/21 - cervical MRI: 1. Unchanged examination of the cervical spine (from 02/22/20 MRI) with moderate right C3-4 and mild right C4-5 and left C6-7 neural foraminal stenosis.  2. No  spinal canal stenosis.   Multiple head CTs along with MRI & MRA of neck brain from 04/2021 through 06/2021 - all negative with no abnormalities noted.   ? Patient Stated Goals "less, if not no pain in my neck and improved strength in R side as well as improve my balance"   ? Currently in Pain? Yes   ? Pain Score 4    ? Pain Location Head   ? Pain Orientation Right   ? Pain Descriptors / Indicators Dull   ? Pain Type Chronic pain   ? ?  ?  ? ?  ? ? ? ? ? ? ? ? ? ? ? ? ? ? ? ? ? ? ? ? [facility] Adult PT Treatment/Exercise - 09/30/21 0001   ? ?  ? Neck Exercises: Machines for Strengthening  ? UBE (Upper Arm Bike) L2.0 x 6 min (3 min fwd/3 min back)   ?  ? Neck Exercises: Theraband  ? Shoulder External Rotation 15 reps;Green   ? Shoulder External Rotation Limitations standing back against doorframe for scap retraction   ? Horizontal ABduction 10 reps;Green   ? Horizontal ABduction Limitations standing back against  doorframe for scap retraction   ?  ? Neck Exercises: Standing  ? Other Standing Exercises Serratus rollups on 6" FR x 20   ?  ? Neck Exercises: Prone  ? Other Prone Exercise thread the needle 5 reps R/L, quadruped arm raises x 10 R/L   ?  ? Shoulder Exercises: ROM/Strengthening  ? Wall Pushups 10 reps   2 set  ?  ? Manual Therapy  ? Soft tissue mobilization STM to B UT, LS, rhomboids, suboccipitals   ? ?  ?  ? ?  ? ? ? ? ? ? ? ? ? ? ? ? PT Short Term Goals - 09/13/21 1712   ? ?  ? PT SHORT TERM GOAL #1  ? Title Patient will be independent with initial HEP   ? Status Achieved   09/13/21  ? Target Date 09/14/21   ? ?  ?  ? ?  ? ? ? ? PT Long Term Goals - 09/27/21 1624   ? ?  ? PT LONG TERM GOAL #1  ? Title Patient will be independent with ongoing/advanced HEP for self-management at home   ? Status On-going   ? Target Date 10/12/21   ?  ? PT LONG TERM GOAL #2  ? Title Improve posture and alignment with patient to demonstrate improved upright posture with posterior shoulder girdle engaged   ? Status Partially Met   ?  Target Date 10/12/21   ?  ? PT LONG TERM GOAL #3  ? Title Decrease R sided neck pain by >/= 50-75% allowing patient increased ease of cervical ROM and improved comfort in preferred sleeping position   ? Status On-going   09/27/21 - Pt reports pain has improved by 30%, with some days better than that  ? Target Date 10/12/21   ?  ? PT LONG TERM GOAL #4  ? Title Patient to improve cervical AROM to [facility] without pain provocation   ? Status On-going   ? Target Date 10/12/21   ?  ? PT LONG TERM GOAL #5  ? Title Patient will demonstrate improved B shoulder strength to >/= 4+/5 for functional UE use   ? Status On-going   ? Target Date 10/12/21   ?  ? PT LONG TERM GOAL #6  ? Title Patient will report no sleep disturbance due to R sided neck pain   ? Status Partially Met   09/27/21 - Pt reports no sleep distubance due to pain but sometimes wakes up with some discomfort potentially due to how she slept  ? Target Date 10/12/21   ? ?  ?  ? ?  ? ? ? ? ? ? ? ? Plan - 09/30/21 1730   ? ? Clinical Impression Statement Pt arrived late to the session. Continued progressing scapular stability and thoracic mobility. Pt was limited with quadruped exercises due to the WB through arms, rest breaks were required in between exercises. Pt required cues with standing ER in doorframe and tactile cuing for scapular retraction needed as well. Palpated less restrictions along the neck and shoulders today but tautness noted more along L UT, LS. Pt will likely need recert next visit due to scheduling.   ? Personal Factors and Comorbidities Age;Behavior Pattern;Comorbidity 3+;Fitness;Profession;Past/Current Experience;Time since onset of injury/illness/exacerbation   ? Comorbidities HTN, migraine, asthma, sinusitis, pre-diabetes/DM-II, GERD, OSA, thyroid cancer s/p thyroidectomy 2002, LBP with L sciatica, DJD, OA, insomnia, R CTR   ? PT Frequency 2x / week   ? PT Duration  6 weeks   ? PT Treatment/Interventions ADLs/Self Care Home  Management;Cryotherapy;Electrical Stimulation;Iontophoresis 29m/ml Dexamethasone;Moist Heat;Traction;Ultrasound;Functional mobility training;Therapeutic activities;Therapeutic exercise;Balance training;Neuromuscular re-education;Patient/family education;Manual techniques;Passive range of motion;Dry needling;Taping;Spinal Manipulations;Joint Manipulations   ? PT Next Visit Plan postural awareness and stretching; postural strengthening; MT to suboccipitals, UT, LS, cervical paraspinals, rhomboids and pecs with possible DN to UT/LS   ? PT Home Exercise Plan Access Code: [email or web address])   ? Consulted and Agree with Plan of Care Patient   ? ?  ?  ? ?  ? ? ?Patient will benefit from skilled therapeutic intervention in order to improve the following deficits and impairments:  Decreased activity tolerance, Decreased endurance, Decreased knowledge of precautions, Decreased mobility, Decreased range of motion, Decreased strength, Increased fascial restricitons, Increased muscle spasms, Impaired perceived functional ability, Impaired flexibility, Impaired UE functional use, Improper body mechanics, Postural dysfunction, Pain, Decreased balance ? ?Visit Diagnosis: ?Cervicalgia ? ?Abnormal posture ? ?Muscle weakness (generalized) ? ?Other symptoms and signs involving the musculoskeletal system ? ?Other muscle spasm ? ? ? ? ?Problem List ?Patient Active Problem List  ? Diagnosis Date Noted  ? Lumbar radiculopathy 09/09/2021  ? Cervical cancer screening 03/07/2021  ? Allergic conjunctivitis 04/08/2020  ? Neck pain 02/06/2020  ? Hip pain, right 09/30/2019  ? COVID-19 09/30/2019  ? Educated about COVID-19 virus infection 12/05/2018  ? High risk heterosexual behavior 11/20/2017  ? Baker's cyst of knee, right 02/16/2017  ? Insomnia 07/23/2016  ? Hypokalemia 07/23/2016  ? Controlled type 2 diabetes mellitus without complication, without long-term current use of insulin [place] 01/24/2016  ? Preventative health care 01/18/2016  ?  Left-sided low back pain with left-sided sciatica 10/07/2015  ? Obesity (BMI 30-39.9) 06/05/2015  ? OSA (obstructive sleep apnea) 02/02/2015  ? Palpitations 09/19/2014  ? Plantar fasciitis, bilateral 11/19/2013  ? Pronation deformity of

## 2021-10-04 ENCOUNTER — Ambulatory Visit: Payer: Medicare Other | Admitting: Physical Therapy

## 2021-10-04 ENCOUNTER — Other Ambulatory Visit: Payer: Self-pay

## 2021-10-04 ENCOUNTER — Encounter: Payer: Self-pay | Admitting: Physical Therapy

## 2021-10-04 DIAGNOSIS — R293 Abnormal posture: Secondary | ICD-10-CM | POA: Diagnosis not present

## 2021-10-04 DIAGNOSIS — M62838 Other muscle spasm: Secondary | ICD-10-CM

## 2021-10-04 DIAGNOSIS — M542 Cervicalgia: Secondary | ICD-10-CM

## 2021-10-04 DIAGNOSIS — G8929 Other chronic pain: Secondary | ICD-10-CM

## 2021-10-04 DIAGNOSIS — R29898 Other symptoms and signs involving the musculoskeletal system: Secondary | ICD-10-CM | POA: Diagnosis not present

## 2021-10-04 DIAGNOSIS — M6281 Muscle weakness (generalized): Secondary | ICD-10-CM | POA: Diagnosis not present

## 2021-10-04 NOTE — Therapy (Signed)
[facility] ?[facility] ?[address] ?[place], [place], [address] ?Phone: [phone]   Fax:  [phone] ? ?Physical Therapy Treatment ? ?Patient Details  ?Name: Monica Wallace ?MRN: [id number] ?Date of Birth: [birth year] ?Referring Provider (PT): [name], MD ? ? ?Encounter Date: 10/04/2021 ? ? PT End of Session - 10/04/21 1615   ? ? Visit Number 8   ? Number of Visits 12   ? Date for PT Re-Evaluation 10/12/21   ? Authorization Type Medicare & AARP   ? PT Start Time 1615   ? PT Stop Time 1708   ? PT Time Calculation (min) 53 min   ? Activity Tolerance Patient tolerated treatment well   ? Behavior During Therapy [facility] for tasks assessed/performed   ? ?  ?  ? ?  ? ? ?Past Medical History:  ?Diagnosis Date  ? Arthritis   ? Asthma   ? Baker's cyst of knee, right 02/16/2017  ? Cancer [facility]) 2002  ? thyroid (2002)  ? Cystic breast   ? Diabetes ([place])   ? GERD (gastroesophageal reflux disease)   ? Hepatic cyst 02/02/2015  ? Hyperlipidemia   ? Hypertension   ? Insomnia 07/23/2016  ? Menopause   ? Migraine   ? Palpitations   ? Preventative health care 01/18/2016  ? Sleep apnea 02/02/2015  ? Thyroid disease   ? ? ?Past Surgical History:  ?Procedure Laterality Date  ? ABDOMINAL HYSTERECTOMY    ? took right ovary and uterus  ? APPENDECTOMY    ? CARPAL TUNNEL RELEASE Right   ? CESAREAN SECTION    ? CHOLECYSTECTOMY    ? EYE SURGERY Bilateral 2017  ? cataracts  ? KNEE ARTHROPLASTY    ? SHOULDER ARTHROSCOPY W/ ROTATOR CUFF REPAIR Left   ? THYROIDECTOMY  2002  ? ? ?There were no vitals filed for this visit. ? ? Subjective Assessment - 10/04/21 1617   ? ? Subjective Pt reports only a dull pain behind her R ear, mostly just with movement.   ? Diagnostic tests 07/31/21 - cervical MRI: 1. Unchanged examination of the cervical spine (from 02/22/20 MRI) with moderate right C3-4 and mild right C4-5 and left C6-7 neural foraminal stenosis.  2. No spinal canal stenosis.   Multiple head CTs  along with MRI & MRA of neck brain from 04/2021 through 06/2021 - all negative with no abnormalities noted.   ? Patient Stated Goals "less, if not no pain in my neck and improved strength in R side as well as improve my balance"   ? Currently in Pain? Yes   ? Pain Score 2    ? Pain Location Neck   behind R ear  ? Pain Orientation Right   ? Pain Descriptors / Indicators Dull   ? Pain Type Chronic pain   ? Pain Frequency Intermittent   ? ?  ?  ? ?  ? ? ? ? ? ? ? ? ? ? ? ? ? ? ? ? ? ? ? ? [facility] Adult PT Treatment/Exercise - 10/04/21 1615   ? ?  ? Neck Exercises: Machines for Strengthening  ? UBE (Upper Arm Bike) L2.5 x 6 min (3 min fwd/3 min back)   ?  ? Neck Exercises: Stretches  ? Upper Trapezius Stretch Right;2 reps;30 seconds   ? Levator Stretch Right;2 reps;30 seconds   ? Other Neck Stretches R SCM stretch 2 x 30 sec   2 sets -  before & after DN - better stretch after DN  ?  ? Modalities  ? Modalities Moist Heat   ?  ? Moist Heat Therapy  ? Number Minutes Moist Heat 10 Minutes   ? Moist Heat Location Cervical   ?  ? Manual Therapy  ? Manual Therapy Soft tissue mobilization;Myofascial release;Passive ROM;Manual Traction   ? Manual therapy comments skilled palpation and monitoring of soft tissue during DN   ? Soft tissue mobilization STM to R SCM,  UT, LS, scalenes   ? Myofascial Release pin and stretch to R SCM, UT, LS; B subocciptal release   ? Passive ROM manual R UT, LS and SCM stretches   ? Manual Traction gentle cervical distraction 2 x 30 sec   ? ?  ?  ? ?  ? ? ? Trigger Point Dry Needling - 10/04/21 1615   ? ? Consent Given? Yes   ? Muscles Treated Head and Neck Sternocleidomastoid;Scalenes;Levator scapulae   ? Dry Needling Comments right   ? Sternocleidomastoid Response Twitch response elicited;Palpable increased muscle length   ? Levator Scapulae Response Twitch response elicited;Palpable increased muscle length   ? Scalenes Response Twitch reponse elicited;Palpable increased muscle length   ? ?  ?  ? ?   ? ? ? ? ? ? ? ? PT Education - 10/04/21 1658   ? ? Education Details HEP update - SCM stretch   ? Person(s) Educated Patient   ? Methods Explanation;Demonstration;Verbal cues;Tactile cues;Handout   ? Comprehension Verbalized understanding;Verbal cues required;Tactile cues required;Returned demonstration;Need further instruction   ? ?  ?  ? ?  ? ? ? PT Short Term Goals - 09/13/21 1712   ? ?  ? PT SHORT TERM GOAL #1  ? Title Patient will be independent with initial HEP   ? Status Achieved   09/13/21  ? Target Date 09/14/21   ? ?  ?  ? ?  ? ? ? ? PT Long Term Goals - 09/27/21 1624   ? ?  ? PT LONG TERM GOAL #1  ? Title Patient will be independent with ongoing/advanced HEP for self-management at home   ? Status On-going   ? Target Date 10/12/21   ?  ? PT LONG TERM GOAL #2  ? Title Improve posture and alignment with patient to demonstrate improved upright posture with posterior shoulder girdle engaged   ? Status Partially Met   ? Target Date 10/12/21   ?  ? PT LONG TERM GOAL #3  ? Title Decrease R sided neck pain by >/= 50-75% allowing patient increased ease of cervical ROM and improved comfort in preferred sleeping position   ? Status On-going   09/27/21 - Pt reports pain has improved by 30%, with some days better than that  ? Target Date 10/12/21   ?  ? PT LONG TERM GOAL #4  ? Title Patient to improve cervical AROM to [facility] without pain provocation   ? Status On-going   ? Target Date 10/12/21   ?  ? PT LONG TERM GOAL #5  ? Title Patient will demonstrate improved B shoulder strength to >/= 4+/5 for functional UE use   ? Status On-going   ? Target Date 10/12/21   ?  ? PT LONG TERM GOAL #6  ? Title Patient will report no sleep disturbance due to R sided neck pain   ? Status Partially Met   09/27/21 - Pt reports no sleep distubance due to pain but sometimes wakes up  with some discomfort potentially due to how she slept  ? Target Date 10/12/21   ? ?  ?  ? ?  ? ? ? ? ? ? ? ? Plan - 10/04/21 1658   ? ? Clinical Impression  Statement Aeron reports overall pain has been better but still notes a dull pain behind her R ear with movement. Mild tightness in R SCM and mod tightness in R LS noted which were addressed with stretching initially but pt unable to note much difference, therefore moved to MT and DN with better relief noted. Pt reporting reduction in pain and more effective SCM stretch following MT/DN - SCM added to HEP.   ? Comorbidities HTN, migraine, asthma, sinusitis, pre-diabetes/DM-II, GERD, OSA, thyroid cancer s/p thyroidectomy 2002, LBP with L sciatica, DJD, OA, insomnia, R CTR   ? Rehab Potential Good   ? PT Frequency 2x / week   ? PT Duration 6 weeks   ? PT Treatment/Interventions ADLs/Self Care Home Management;Cryotherapy;Electrical Stimulation;Iontophoresis 85m/ml Dexamethasone;Moist Heat;Traction;Ultrasound;Functional mobility training;Therapeutic activities;Therapeutic exercise;Balance training;Neuromuscular re-education;Patient/family education;Manual techniques;Passive range of motion;Dry needling;Taping;Spinal Manipulations;Joint Manipulations   ? PT Next Visit Plan postural awareness and stretching; postural strengthening; MT to suboccipitals, UT, LS, cervical paraspinals, rhomboids, SCM and pecs with DN as indicated   ? PT Home Exercise Plan Access Code: [email or web address](2/14, 3/13 & 3/20)   ? Consulted and Agree with Plan of Care Patient   ? ?  ?  ? ?  ? ? ?Patient will benefit from skilled therapeutic intervention in order to improve the following deficits and impairments:  Decreased activity tolerance, Decreased endurance, Decreased knowledge of precautions, Decreased mobility, Decreased range of motion, Decreased strength, Increased fascial restricitons, Increased muscle spasms, Impaired perceived functional ability, Impaired flexibility, Impaired UE functional use, Improper body mechanics, Postural dysfunction, Pain, Decreased balance ? ?Visit Diagnosis: ?Cervicalgia ? ?Abnormal posture ? ?Muscle weakness  (generalized) ? ?Other symptoms and signs involving the musculoskeletal system ? ?Other muscle spasm ? ?Chronic neck pain ? ? ? ? ?Problem List ?Patient Active Problem List  ? Diagnosis Date Noted  ? Lumbar radiculopathy

## 2021-10-04 NOTE — Patient Instructions (Signed)
? ? ? ?  Access Code: [email or web address] ?URL: [email or web address] ?Date: 10/04/2021 ?Prepared by: Annie Paras ? ?Exercises ?Seated Cervical Retraction - 2-3 x daily - 7 x weekly - 2 sets - 10 reps - 3-5 sec hold ?Seated Scapular Retraction - 2-3 x daily - 7 x weekly - 2 sets - 10 reps - 5 sec hold ?Seated Gentle Upper Trapezius Stretch - 2-3 x daily - 7 x weekly - 3 reps - 30 sec hold ?Gentle Levator Scapulae Stretch - 2-3 x daily - 7 x weekly - 3 reps - 30 sec hold ?Doorway Pec Stretch at 60 Degrees Abduction with Arm Straight - 2-3 x daily - 7 x weekly - 3 reps - 30 sec hold ?Doorway Pec Stretch at 90 Elevation with Arm Straight - 2-3 x daily - 7 x weekly - 3 reps - 30 sec hold ?Standing Bilateral Low Shoulder Row with Anchored Resistance - 1 x daily - 7 x weekly - 3 sets - 10 reps ?Shoulder Extension with Resistance - 1 x daily - 7 x weekly - 3 sets - 10 reps ?Upper Cervical Extension SNAG with Strap - 1 x daily - 7 x weekly - 2 sets - 10 reps - 3 sec hold ?Seated Assisted Cervical Rotation with Towel - 1 x daily - 7 x weekly - 2 sets - 10 reps - 3 sec hold ?Single Arm Scapular Depression with Anchored Resistance - Straight Arm - 1 x daily - 7 x weekly - 2 sets - 10 reps - 3 sec hold ?Standing Single Arm Shoulder PNF D1 Extension with Anchored Resistance - 1 x daily - 7 x weekly - 2 sets - 10 reps - 3 sec hold ?Scapular Retraction with Resistance Advanced - 1 x daily - 7 x weekly - 2 sets - 10 reps - 5 sec hold ?Sternocleidomastoid Stretch - 2-3 x daily - 7 x weekly - 3 reps - 30 sec hold ? ?

## 2021-10-05 DIAGNOSIS — L814 Other melanin hyperpigmentation: Secondary | ICD-10-CM | POA: Diagnosis not present

## 2021-10-05 DIAGNOSIS — X32XXXS Exposure to sunlight, sequela: Secondary | ICD-10-CM | POA: Diagnosis not present

## 2021-10-06 ENCOUNTER — Ambulatory Visit (INDEPENDENT_AMBULATORY_CARE_PROVIDER_SITE_OTHER): Payer: Medicare Other | Admitting: Cardiology

## 2021-10-06 ENCOUNTER — Other Ambulatory Visit: Payer: Self-pay

## 2021-10-06 ENCOUNTER — Encounter: Payer: Self-pay | Admitting: Cardiology

## 2021-10-06 VITALS — BP 124/80 | HR 76 | Ht 62.0 in | Wt 200.1 lb

## 2021-10-06 DIAGNOSIS — R55 Syncope and collapse: Secondary | ICD-10-CM | POA: Diagnosis not present

## 2021-10-06 DIAGNOSIS — R42 Dizziness and giddiness: Secondary | ICD-10-CM

## 2021-10-06 DIAGNOSIS — E7800 Pure hypercholesterolemia, unspecified: Secondary | ICD-10-CM | POA: Diagnosis not present

## 2021-10-06 DIAGNOSIS — I10 Essential (primary) hypertension: Secondary | ICD-10-CM

## 2021-10-06 NOTE — Patient Instructions (Signed)

## 2021-10-07 ENCOUNTER — Other Ambulatory Visit: Payer: Self-pay | Admitting: Family Medicine

## 2021-10-14 ENCOUNTER — Ambulatory Visit: Payer: Medicare Other

## 2021-10-20 ENCOUNTER — Ambulatory Visit: Payer: Medicare Other

## 2021-10-21 ENCOUNTER — Ambulatory Visit (INDEPENDENT_AMBULATORY_CARE_PROVIDER_SITE_OTHER): Payer: Medicare Other | Admitting: Neurology

## 2021-10-21 ENCOUNTER — Encounter: Payer: Self-pay | Admitting: Neurology

## 2021-10-21 VITALS — BP 158/73 | HR 80 | Ht 62.0 in | Wt 204.0 lb

## 2021-10-21 DIAGNOSIS — W19XXXS Unspecified fall, sequela: Secondary | ICD-10-CM

## 2021-10-21 DIAGNOSIS — R29898 Other symptoms and signs involving the musculoskeletal system: Secondary | ICD-10-CM

## 2021-10-21 DIAGNOSIS — R269 Unspecified abnormalities of gait and mobility: Secondary | ICD-10-CM

## 2021-10-21 DIAGNOSIS — M5412 Radiculopathy, cervical region: Secondary | ICD-10-CM

## 2021-10-21 NOTE — Progress Notes (Signed)
? ?[facility] ? ?PATIENT: Monica Wallace ?DOB: 12/20/1950 ? ?REQUESTING CLINICIAN: [name], MD ?HISTORY FROM: Patient ?REASON FOR VISIT: Right arm and leg weakness/numbness  ? ? ?HISTORICAL ? ?CHIEF COMPLAINT:  ?Chief Complaint  ?Patient presents with  ? Follow-up  ?  Rm 13. Alone. ?Right sided weakness has improved, but is still there. Pt reports she fell Sunday after getting out of the shower.  ? ?INTERVAL HISTORY 10/21/21 ?Patient presents today for follow-up, at last visit plan was to obtain a MRI cervical spine regarding the right-sided numbness and weakness.  MRI cervical spine was unchanged from prior and it showed moderate right-sided C3 thru C7 moderate foraminal stenosis, there were no spinal canal stenosis.  Patient stated that there was numbness and the weakness is still present, denies any radicular pain.  ?She reports not feeling safe regarding fall as she fell last week and hurt her right wrist. She feel like the numbness is worse in the right leg rather than right arm ? ? ?INTERVAL HISTORY 07/21/2021:  ?Patient present today for follow-up, she presented to the ED on December 30 for right-sided weakness and numbness.  Head CT was done at that time and was negative for any acute stroke.  Patient reports that she continued to have numbness in the right side including arm and leg, face not involved.  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  She also complained neck pain right shoulder pain and there is also shooting pain coming down the right arm.   ? ? ?HISTORY OF PRESENT ILLNESS:  ?This is a 71 year old woman with past medical history of asthma, hypertension, thyroid disease and diabetes mellitus type 2 who is presenting with recurrent episodes of dizziness associated with lower extremities weakness.  Patient reports a total of 4 episodes of lightheadedness associated with weakness of both legs since the summer, the  last episode was on December 2 and what was different is that it was associated with right arm heaviness.  Patient said on on that morning she woke up to reach for her alarm clock and noted that her right arm fell very heavy then she got up to use the bathroom and had difficulty walking due to weakness of the lower extremities, therefore she called for help and was taken to the ED.  On arrival at the ED her weakness improved but she still reports ongoing brain fog.  She had extensive work-up including 2 MRIs brain, MRA head and neck, MRI cervical spine and echocardiogram of her heart, all within normal limits.  Her other testing including TSH was normal and a hemoglobin A1c was at  6.4.  During this time also she has followed up with her primary cardiologist and has some of her blood pressure medications changed but she still experiences these episodes, denies falls. ? ? ? ?OTHER MEDICAL CONDITIONS: [place], HTN, Thyroid disease, DM ? ? ?REVIEW OF SYSTEMS: Full 14 system review of systems performed and negative with exception of: as noted in the HPI ? ?ALLERGIES: ?Allergies  ?Allergen Reactions  ? Levaquin [Levofloxacin] Shortness Of Breath  ? Penicillins Other (See Comments)  ?  Positive on allergy testing 'years ago.' Has never had penicillin. Has tolerated amoxicillin.  ? Clindamycin/Lincomycin Other (See Comments)  ?  Pruritus ?No rash  ? Codeine Itching  ? Gabapentin Itching  ? ? ?HOME MEDICATIONS: ?Outpatient Medications Prior to Visit  ?Medication Sig Dispense Refill  ? acetaminophen (TYLENOL)  325 MG tablet Take 650 mg by mouth every 6 (six) hours as needed.    ? albuterol (VENTOLIN HFA) 108 (90 Base) MCG/ACT inhaler INHALE 2 PUFFS INTO THE LUNGS EVERY 6 HOURS AS NEEDED FOR WHEEZING OR SHORTNESS OF BREATH 25.5 g 1  ? azelastine (ASTELIN) 0.1 % nasal spray Place 1 spray in each nostril twice a day as needed for runny nose/drainage down throat (Patient taking differently: Place 1 spray into both nostrils daily.)  90 mL 1  ? Blood Glucose Monitoring Suppl ([place]) w/Device KIT Use to check blood glucose once a day.  DX code: E11.9 1 kit 0  ? budesonide (PULMICORT) 180 MCG/ACT inhaler Inhale 1-2 puffs into the lungs 2 (two) times daily. 3 each 1  ? Cholecalciferol ([employer] VIT D 5000 HIGH-POTENCY PO) Take 5,000 Units by mouth daily.    ? estradiol (ESTRACE VAGINAL) 0.1 MG/GM vaginal cream Place 1 Applicatorful vaginally at bedtime. 42.5 g 12  ? fluticasone (FLONASE) 50 MCG/ACT nasal spray SHAKE LIQUID AND USE 2 SPRAYS IN EACH NOSTRIL DAILY 48 g 1  ? glucose blood (ONETOUCH VERIO) test strip Use to check blood glucose once a day.  DX code: E11.9 100 each 1  ? hyoscyamine (LEVSIN SL) 0.125 MG SL tablet Place 1 tablet (0.125 mg total) under the tongue every 4 (four) hours as needed. 30 tablet 1  ? Lancets (ONETOUCH DELICA PLUS [id number]) MISC Use to check blood glucose once a day.  DX code: E11.9 100 each 1  ? loratadine (CLARITIN) 10 MG tablet Take 10 mg by mouth in the morning and at bedtime.     ? montelukast (SINGULAIR) 10 MG tablet TAKE 1 TABLET(10 MG) BY MOUTH DAILY AS NEEDED (Patient taking differently: Take 10 mg by mouth at bedtime.) 90 tablet 1  ? Omega-3 1000 MG CAPS Take 1,000 mg by mouth daily.    ? omeprazole (PRILOSEC) 40 MG capsule Take 1 capsule by mouth daily.    ? rosuvastatin (CRESTOR) 5 MG tablet TAKE 1 TAKE ONE TABLET EVERY__ HOURS TAKE ONE TABLET EVERY__ HOURS TAB BY MOUTH 2 TIMES A WEEK(TUE AND SUN WEEKLY) 25 tablet 1  ? SYNTHROID 150 MCG tablet Take 1 tablet (150 mcg total) by mouth daily before breakfast. 90 tablet 1  ? triamterene-hydrochlorothiazide (MAXZIDE-25) 37.5-25 MG tablet Take 1 tablet by mouth daily. 90 tablet 0  ? ?No facility-administered medications prior to visit.  ? ? ?PAST MEDICAL HISTORY: ?Past Medical History:  ?Diagnosis Date  ? Arthritis   ? Asthma   ? Baker's cyst of knee, right 02/16/2017  ? Cancer [facility]) 2002  ? thyroid (2002)  ? Cystic breast   ? Diabetes ([place])   ?  GERD (gastroesophageal reflux disease)   ? Hepatic cyst 02/02/2015  ? Hyperlipidemia   ? Hypertension   ? Insomnia 07/23/2016  ? Menopause   ? Migraine   ? Palpitations   ? Preventative health care 01/18/2016  ? Sleep apnea 02/02/2015  ? Thyroid disease   ? ? ?PAST SURGICAL HISTORY: ?Past Surgical History:  ?Procedure Laterality Date  ? ABDOMINAL HYSTERECTOMY    ? took right ovary and uterus  ? APPENDECTOMY    ? CARPAL TUNNEL RELEASE Right   ? CESAREAN SECTION    ? CHOLECYSTECTOMY    ? EYE SURGERY Bilateral 2017  ? cataracts  ? KNEE ARTHROPLASTY    ? SHOULDER ARTHROSCOPY W/ ROTATOR CUFF REPAIR Left   ? THYROIDECTOMY  2002  ? ? ?FAMILY HISTORY: ?Family  History  ?Problem Relation Age of Onset  ? Arthritis Mother   ? Transient ischemic attack Mother   ? Hypertension Mother   ? Hyperlipidemia Mother   ? Diabetes Father   ? Heart disease Father   ? Arthritis Father   ? Kidney disease Father   ? Hypertension Father   ? Hyperlipidemia Father   ? Cancer Sister 3  ?     pancreatic  ? Fibromyalgia Sister   ? Allergic rhinitis Sister   ? Asthma Maternal Grandfather   ?     smoker  ? Depression Maternal Grandfather   ? Dementia Paternal Grandmother   ? Diabetes Paternal Grandfather   ? Hypertension Brother   ? Allergic rhinitis Brother   ? Scoliosis Daughter   ?     had rods placed and removed  ? Arthritis Sister   ? Gout Sister   ? GER disease Sister   ? Cancer Sister   ?     breast cancer  ? Breast cancer Sister 45  ? ? ?SOCIAL HISTORY: ?Social History  ? ?Socioeconomic History  ? Marital status: Divorced  ?  Spouse name: Not on file  ? Number of children: 2  ? Years of education: Not on file  ? Highest education level: Not on file  ?Occupational History  ? Not on file  ?Tobacco Use  ? Smoking status: Never  ? Smokeless tobacco: Never  ?Vaping Use  ? Vaping Use: Never used  ?Substance and Sexual Activity  ? Alcohol use: Yes  ?  Alcohol/week: 0.0 standard drinks  ?  Comment: very rare once or twice a year  ? Drug use: No  ?  Sexual activity: Yes  ?  Birth control/protection: Other-see comments, Surgical  ?  Comment:  boyfriend  ?Other Topics Concern  ? Not on file  ?Social History Narrative  ? Household: pt, daughter and

## 2021-10-22 ENCOUNTER — Ambulatory Visit: Payer: Medicare Other

## 2021-10-22 DIAGNOSIS — G4733 Obstructive sleep apnea (adult) (pediatric): Secondary | ICD-10-CM

## 2021-10-22 NOTE — Patient Instructions (Addendum)
Referral to Dr. Nelva Bush for EMG/Nerve conduction study ?Referral to physical therapy for gait training  ?Follow-up with 25-monthor sooner if worse. ?

## 2021-10-25 ENCOUNTER — Telehealth: Payer: Self-pay | Admitting: Neurology

## 2021-10-25 ENCOUNTER — Ambulatory Visit: Payer: Medicare Other | Attending: Family Medicine

## 2021-10-25 ENCOUNTER — Other Ambulatory Visit: Payer: Self-pay

## 2021-10-25 DIAGNOSIS — R29898 Other symptoms and signs involving the musculoskeletal system: Secondary | ICD-10-CM | POA: Insufficient documentation

## 2021-10-25 DIAGNOSIS — M6281 Muscle weakness (generalized): Secondary | ICD-10-CM | POA: Insufficient documentation

## 2021-10-25 DIAGNOSIS — M62838 Other muscle spasm: Secondary | ICD-10-CM | POA: Insufficient documentation

## 2021-10-25 DIAGNOSIS — M542 Cervicalgia: Secondary | ICD-10-CM | POA: Insufficient documentation

## 2021-10-25 DIAGNOSIS — R293 Abnormal posture: Secondary | ICD-10-CM | POA: Diagnosis not present

## 2021-10-25 DIAGNOSIS — G8929 Other chronic pain: Secondary | ICD-10-CM | POA: Diagnosis not present

## 2021-10-25 NOTE — Therapy (Addendum)
[place] ?[facility] ?[address] ?[place], [place], [address] ?Phone: [phone]   Fax:  [phone] ? ?Physical Therapy Treatment / Discharge Summary ? ?Patient Details  ?Name: Monica Wallace ?MRN: [id number] ?Date of Birth: [birth year] ?Referring Provider (PT): [name], MD ? ? ?Encounter Date: 10/25/2021 ? ? PT End of Session - 10/25/21 1704   ? ? Visit Number 9   ? Number of Visits 12   ? Date for PT Re-Evaluation 10/12/21   ? Authorization Type Medicare & AARP   ? PT Start Time 6962   pt late  ? PT Stop Time 9528   ? PT Time Calculation (min) 28 min   ? Activity Tolerance Patient tolerated treatment well   ? Behavior During Therapy [facility] for tasks assessed/performed   ? ?  ?  ? ?  ? ? ?Past Medical History:  ?Diagnosis Date  ? Arthritis   ? Asthma   ? Baker's cyst of knee, right 02/16/2017  ? Cancer [facility]) 2002  ? thyroid (2002)  ? Cystic breast   ? Diabetes ([place])   ? GERD (gastroesophageal reflux disease)   ? Hepatic cyst 02/02/2015  ? Hyperlipidemia   ? Hypertension   ? Insomnia 07/23/2016  ? Menopause   ? Migraine   ? Palpitations   ? Preventative health care 01/18/2016  ? Sleep apnea 02/02/2015  ? Thyroid disease   ? ? ?Past Surgical History:  ?Procedure Laterality Date  ? ABDOMINAL HYSTERECTOMY    ? took right ovary and uterus  ? APPENDECTOMY    ? CARPAL TUNNEL RELEASE Right   ? CESAREAN SECTION    ? CHOLECYSTECTOMY    ? EYE SURGERY Bilateral 2017  ? cataracts  ? KNEE ARTHROPLASTY    ? SHOULDER ARTHROSCOPY W/ ROTATOR CUFF REPAIR Left   ? THYROIDECTOMY  2002  ? ? ?There were no vitals filed for this visit. ? ? Subjective Assessment - 10/25/21 1638   ? ? Subjective Pt reports 2/10 pain behind R ear and down neck. Been going back and forth to doctor a lot. Also having to take of mother a lot more.   ? Diagnostic tests 07/31/21 - cervical MRI: 1. Unchanged examination of the cervical spine (from 02/22/20 MRI) with moderate right C3-4 and mild right C4-5  and left C6-7 neural foraminal stenosis.  2. No spinal canal stenosis.   Multiple head CTs along with MRI & MRA of neck brain from 04/2021 through 06/2021 - all negative with no abnormalities noted.   ? Patient Stated Goals "less, if not no pain in my neck and improved strength in R side as well as improve my balance"   ? Currently in Pain? Yes   ? Pain Score 2    ? Pain Location Neck   ? Pain Orientation Right   ? Pain Descriptors / Indicators Dull   ? Pain Type Chronic pain   ? ?  ?  ? ?  ? ? ? ? ? [facility] PT Assessment - 10/25/21 0001   ? ?  ? Assessment  ? Medical Diagnosis R sided neck pain and stiffness; R UE weakness/numbness   ? Referring Provider (PT) [name], MD   ? Onset Date/Surgical Date --   2-3 years  ?  ? AROM  ? Cervical Flexion 52   ? Cervical Extension 34   ? Cervical - Right Rotation 50   ? Cervical - Left Rotation 55   ?  ?  Strength  ? Right Shoulder Flexion 4-/5   ? Right Shoulder ABduction 4-/5   ? Right Shoulder Internal Rotation 4+/5   ? Right Shoulder External Rotation 4-/5   ? Left Shoulder Flexion 4-/5   ? Left Shoulder ABduction 4+/5   ? Left Shoulder Internal Rotation 4+/5   ? Left Shoulder External Rotation 4/5   ? ?  ?  ? ?  ? ? ? ? ? ? ? ? ? ? ? ? ? ? ? ? ? ? ? ? ? ? ? ? ? ? ? PT Short Term Goals - 09/13/21 1712   ? ?  ? PT SHORT TERM GOAL #1  ? Title Patient will be independent with initial HEP   ? Status Achieved   09/13/21  ? Target Date 09/14/21   ? ?  ?  ? ?  ? ? ? ? PT Long Term Goals - 10/25/21 1654   ? ?  ? PT LONG TERM GOAL #1  ? Title Patient will be independent with ongoing/advanced HEP for self-management at home   ? Status Achieved   ? Target Date 10/12/21   ?  ? PT LONG TERM GOAL #2  ? Title Improve posture and alignment with patient to demonstrate improved upright posture with posterior shoulder girdle engaged   ? Status Partially Met   ? Target Date 10/12/21   ?  ? PT LONG TERM GOAL #3  ? Title Decrease R sided neck pain by >/= 50-75% allowing patient increased  ease of cervical ROM and improved comfort in preferred sleeping position   ? Status Achieved   10/25/21- 75% decrease  ? Target Date 10/12/21   ?  ? PT LONG TERM GOAL #4  ? Title Patient to improve cervical AROM to [facility] without pain provocation   ? Status Partially Met   ? Target Date 10/12/21   ?  ? PT LONG TERM GOAL #5  ? Title Patient will demonstrate improved B shoulder strength to >/= 4+/5 for functional UE use   ? Status Partially Met   ? Target Date 10/12/21   ?  ? PT LONG TERM GOAL #6  ? Title Patient will report no sleep disturbance due to R sided neck pain   ? Status Achieved   10/25/21  ? Target Date 10/12/21   ? ?  ?  ? ?  ? ? ? ? ? ? ? ? Plan - 10/25/21 1704   ? ? Clinical Impression Statement Pt arrived 15 min late. Noted that the last couple of weeks has been crazy with personal issues. She has been seeing neuroloist for weakness in arms and legs and plans on having more nerve test done to rule out nerve issues. Cervical ROM has improved in most planes with no pain reported, B shoulder strength remains unchanged. Overall she noted 75% decrease in neck pain - met LTG #3. Also reports no sleep disturbance due to pain - LTG #6 met. Posture still shows kyphosis and rounded shoulders. Pt did report MD will send new referral for PT, once testing is concluded and along with all other issues going on at the moment pt would like to end current episode of PT. Therefore will formally D/C from PT.   ? Personal Factors and Comorbidities Age;Behavior Pattern;Comorbidity 3+;Fitness;Profession;Past/Current Experience;Time since onset of injury/illness/exacerbation   ? Comorbidities HTN, migraine, asthma, sinusitis, pre-diabetes/DM-II, GERD, OSA, thyroid cancer s/p thyroidectomy 2002, LBP with L sciatica, DJD, OA, insomnia, R CTR   ?  PT Frequency 2x / week   ? PT Duration 6 weeks   ? PT Treatment/Interventions ADLs/Self Care Home Management;Cryotherapy;Electrical Stimulation;Iontophoresis 30m/ml Dexamethasone;Moist  Heat;Traction;Ultrasound;Functional mobility training;Therapeutic activities;Therapeutic exercise;Balance training;Neuromuscular re-education;Patient/family education;Manual techniques;Passive range of motion;Dry needling;Taping;Spinal Manipulations;Joint Manipulations   ? PT Next Visit Plan postural awareness and stretching; postural strengthening; MT to suboccipitals, UT, LS, cervical paraspinals, rhomboids, SCM and pecs with DN as indicated   ? PT Home Exercise Plan Access Code: [email or web address](2/14, 3/13 & 3/20)   ? Consulted and Agree with Plan of Care Patient   ? ?  ?  ? ?  ? ? ?Patient will benefit from skilled therapeutic intervention in order to improve the following deficits and impairments:  Decreased activity tolerance, Decreased endurance, Decreased knowledge of precautions, Decreased mobility, Decreased range of motion, Decreased strength, Increased fascial restricitons, Increased muscle spasms, Impaired perceived functional ability, Impaired flexibility, Impaired UE functional use, Improper body mechanics, Postural dysfunction, Pain, Decreased balance ? ?Visit Diagnosis: ?Cervicalgia ? ?Abnormal posture ? ?Muscle weakness (generalized) ? ?Other symptoms and signs involving the musculoskeletal system ? ?Other muscle spasm ? ? ? ? ?Problem List ?Patient Active Problem List  ? Diagnosis Date Noted  ? Lumbar radiculopathy 09/09/2021  ? Cervical cancer screening 03/07/2021  ? Allergic conjunctivitis 04/08/2020  ? Neck pain 02/06/2020  ? Hip pain, right 09/30/2019  ? COVID-19 09/30/2019  ? Educated about COVID-19 virus infection 12/05/2018  ? High risk heterosexual behavior 11/20/2017  ? Baker's cyst of knee, right 02/16/2017  ? Insomnia 07/23/2016  ? Hypokalemia 07/23/2016  ? Controlled type 2 diabetes mellitus without complication, without long-term current use of insulin [place] 01/24/2016  ? Preventative health care 01/18/2016  ? Left-sided low back pain with left-sided sciatica 10/07/2015  ? Obesity (BMI  30-39.9) 06/05/2015  ? OSA (obstructive sleep apnea) 02/02/2015  ? Palpitations 09/19/2014  ? Plantar fasciitis, bilateral 11/19/2013  ? Pronation deformity of ankle, acquired 11/19/2013  ? DJD (degenerative joint d

## 2021-10-25 NOTE — Telephone Encounter (Signed)
Order for NCV/EMG sent to Dr. Nelva Bush @ [facility] [phone]. ?

## 2021-10-29 ENCOUNTER — Other Ambulatory Visit: Payer: Self-pay | Admitting: Family

## 2021-10-29 ENCOUNTER — Telehealth: Payer: Self-pay | Admitting: Family

## 2021-10-29 MED ORDER — AZELASTINE HCL 0.1 % NA SOLN: NASAL | 0 refills | Status: DC

## 2021-10-29 NOTE — Telephone Encounter (Signed)
Pt request refill for azelastine, pt would like prescription sent to [facility], [address] ?

## 2021-10-29 NOTE — Telephone Encounter (Signed)
Sent in one refill as pt is scheduled to be seen next week  ?

## 2021-11-01 ENCOUNTER — Encounter: Payer: Medicare Other | Admitting: Physical Therapy

## 2021-11-01 NOTE — Patient Instructions (Incomplete)
Mild persistent asthma ?Continue Pulmicort 180 mcg 1-2 puffs once a day with spacer to help prevent cough and wheeze. For asthma flares/ upper respiratory infections increase Pulmicort 180 mcg 2 puffs twice a day until symptoms are back to baseline ?Continue montelukast (Singulair) 10 mg once a day to help prevent cough and wheeze ?May use albuterol 2 puffs every 4-6 hours as needed for cough, wheeze, tightness in chest, shortness of breath.  Also may use albuterol 2 puffs 5 to 15 minutes prior to exercise ? ?Allergic rhinoconjunctivitis (2021 skin testing shows intradermal's mildly reactive to grass pollen, tree pollen, mold mix #4, and dust mite) ?Get repeat immune lab work 4-6 weeks after pneumovax. (Had pneumovax 01/14/2021) ?Continue azelastine/fluticasone nasal spray 1 spray each nostril twice a day as needed for runny nose/stuffy nose/drainage down throat ?May use sinus rinse as needed for nasal symptoms.  Use this prior to any medicated nasal sprays ?For thick postnasal drip may add on guaifenesin (Mucinex) 600 to 1200 mg twice a day as needed.  Make sure to drink plenty of fluids. ?May use Pataday 1 drop each eye once a day as needed for itchy watery eyes ? ?Please let us know if this treatment plan is not working well for you ?Schedule a follow-up appointment in  months or sooner if needed ?

## 2021-11-02 ENCOUNTER — Ambulatory Visit: Payer: Medicare Other | Admitting: Family

## 2021-11-02 DIAGNOSIS — J45901 Unspecified asthma with (acute) exacerbation: Secondary | ICD-10-CM | POA: Diagnosis not present

## 2021-11-02 DIAGNOSIS — J029 Acute pharyngitis, unspecified: Secondary | ICD-10-CM | POA: Diagnosis not present

## 2021-11-02 DIAGNOSIS — R519 Headache, unspecified: Secondary | ICD-10-CM | POA: Diagnosis not present

## 2021-11-02 DIAGNOSIS — J069 Acute upper respiratory infection, unspecified: Secondary | ICD-10-CM | POA: Diagnosis not present

## 2021-11-04 ENCOUNTER — Encounter (HOSPITAL_BASED_OUTPATIENT_CLINIC_OR_DEPARTMENT_OTHER): Payer: Self-pay

## 2021-11-04 ENCOUNTER — Emergency Department (HOSPITAL_BASED_OUTPATIENT_CLINIC_OR_DEPARTMENT_OTHER)
Admission: EM | Admit: 2021-11-04 | Discharge: 2021-11-04 | Disposition: A | Discharge status: Home / Self Care | Payer: Medicare Other | Attending: Emergency Medicine | Admitting: Emergency Medicine

## 2021-11-04 ENCOUNTER — Emergency Department (HOSPITAL_BASED_OUTPATIENT_CLINIC_OR_DEPARTMENT_OTHER): Payer: Medicare Other

## 2021-11-04 ENCOUNTER — Other Ambulatory Visit: Payer: Self-pay

## 2021-11-04 DIAGNOSIS — R059 Cough, unspecified: Secondary | ICD-10-CM | POA: Diagnosis not present

## 2021-11-04 DIAGNOSIS — J4541 Moderate persistent asthma with (acute) exacerbation: Secondary | ICD-10-CM | POA: Insufficient documentation

## 2021-11-04 DIAGNOSIS — Z7951 Long term (current) use of inhaled steroids: Secondary | ICD-10-CM | POA: Insufficient documentation

## 2021-11-04 DIAGNOSIS — Z20822 Contact with and (suspected) exposure to covid-19: Secondary | ICD-10-CM | POA: Diagnosis not present

## 2021-11-04 DIAGNOSIS — J029 Acute pharyngitis, unspecified: Secondary | ICD-10-CM | POA: Diagnosis present

## 2021-11-04 LAB — COMPREHENSIVE METABOLIC PANEL
ALT: 40 U/L (ref 0–44)
AST: 45 U/L — ABNORMAL HIGH (ref 15–41)
Albumin: 3.6 g/dL (ref 3.5–5.0)
Alkaline Phosphatase: 82 U/L (ref 38–126)
Anion gap: 10 (ref 5–15)
BUN: 23 mg/dL (ref 8–23)
CO2: 25 mmol/L (ref 22–32)
Calcium: 9.3 mg/dL (ref 8.9–10.3)
Chloride: 102 mmol/L (ref 98–111)
Creatinine, Ser: 0.93 mg/dL (ref 0.44–1.00)
GFR, Estimated: >60 mL/min (ref >60)
Glucose, Bld: 142 mg/dL — ABNORMAL HIGH (ref 70–99)
Potassium: 3.6 mmol/L (ref 3.5–5.1)
Sodium: 137 mmol/L (ref 135–145)
Total Bilirubin: 0.2 mg/dL — ABNORMAL LOW (ref 0.3–1.2)
Total Protein: 7.5 g/dL (ref 6.5–8.1)

## 2021-11-04 LAB — CBC WITH DIFFERENTIAL/PLATELET
Abs Immature Granulocytes: 0.16 10*3/uL — ABNORMAL HIGH (ref 0.00–0.07)
Basophils Absolute: 0 10*3/uL (ref 0.0–0.1)
Basophils Relative: 0 %
Eosinophils Absolute: 0 10*3/uL (ref 0.0–0.5)
Eosinophils Relative: 0 %
HCT: 42 % (ref 36.0–46.0)
Hemoglobin: 14.1 g/dL (ref 12.0–15.0)
Immature Granulocytes: 1 %
Lymphocytes Relative: 14 %
Lymphs Abs: 1.8 10*3/uL (ref 0.7–4.0)
MCH: 29.9 pg (ref 26.0–34.0)
MCHC: 33.6 g/dL (ref 30.0–36.0)
MCV: 89 fL (ref 80.0–100.0)
Monocytes Absolute: 0.4 10*3/uL (ref 0.1–1.0)
Monocytes Relative: 3 %
Neutro Abs: 10.9 10*3/uL — ABNORMAL HIGH (ref 1.7–7.7)
Neutrophils Relative %: 82 %
Platelets: 330 10*3/uL (ref 150–400)
RBC: 4.72 MIL/uL (ref 3.87–5.11)
RDW: 13.7 % (ref 11.5–15.5)
WBC: 13.2 10*3/uL — ABNORMAL HIGH (ref 4.0–10.5)
nRBC: 0 % (ref 0.0–0.2)

## 2021-11-04 LAB — RESP PANEL BY RT-PCR (FLU A&B, COVID) ARPGX2
Influenza A by PCR: NEGATIVE
Influenza B by PCR: NEGATIVE
SARS Coronavirus 2 by RT PCR: NEGATIVE

## 2021-11-04 MED ORDER — SODIUM CHLORIDE 0.9 % IV BOLUS
1000.0000 mL | Freq: Once | INTRAVENOUS | Status: AC
  Administered 2021-11-04: 1000 mL via INTRAVENOUS

## 2021-11-04 MED ORDER — ALBUTEROL SULFATE (2.5 MG/3ML) 0.083% IN NEBU
5.0000 mg | INHALATION_SOLUTION | Freq: Once | RESPIRATORY_TRACT | Status: AC
Start: 2021-11-04 — End: 2021-11-04
  Administered 2021-11-04: 5 mg via RESPIRATORY_TRACT
  Filled 2021-11-04: qty 6

## 2021-11-04 MED ORDER — AZITHROMYCIN 250 MG PO TABS: 250.0000 mg | ORAL_TABLET | Freq: Every day | ORAL | 0 refills | Status: DC

## 2021-11-04 MED ORDER — ALBUTEROL SULFATE (2.5 MG/3ML) 0.083% IN NEBU: 2.5000 mg | INHALATION_SOLUTION | Freq: Four times a day (QID) | RESPIRATORY_TRACT | 0 refills | Status: DC | PRN

## 2021-11-04 MED ORDER — IPRATROPIUM BROMIDE 0.02 % IN SOLN
0.5000 mg | Freq: Once | RESPIRATORY_TRACT | Status: AC
  Administered 2021-11-04: 0.5 mg via RESPIRATORY_TRACT
  Filled 2021-11-04: qty 2.5

## 2021-11-04 MED ORDER — PREDNISONE 20 MG PO TABS: ORAL_TABLET | ORAL | 0 refills | Status: DC

## 2021-11-04 MED ORDER — METHYLPREDNISOLONE SODIUM SUCC 125 MG IJ SOLR
125.0000 mg | Freq: Once | INTRAMUSCULAR | Status: AC
  Administered 2021-11-04: 125 mg via INTRAVENOUS
  Filled 2021-11-04: qty 2

## 2021-11-04 MED ORDER — ALBUTEROL SULFATE HFA 108 (90 BASE) MCG/ACT IN AERS
2.0000 | INHALATION_SPRAY | Freq: Once | RESPIRATORY_TRACT | Status: DC
  Filled 2021-11-04: qty 6.7

## 2021-11-04 NOTE — ED Triage Notes (Signed)
C/o cough this weekend. Diagnosed with strep Tuesday. Been taking antibiotics. States feels worse today. Congestion has "moved down" and has more malaise and worsening cough. ?

## 2021-11-04 NOTE — ED Provider Notes (Signed)
?[facility] EMERGENCY DEPARTMENT ?Provider Note ? ? ?CSN: [id number] ?Arrival date & time: 11/04/21  1825 ? ?  ? ?History ? ?Chief Complaint  ?Patient presents with  ? Cough  ? ? ?Monica Wallace is a 71 y.o. female history of asthma, seasonal allergies here presenting with malaise and cough.  Patient states that she went to her primary care doctor's office for sore throat and cough earlier this week.  She was diagnosed with strep throat and is still taking her antibiotic.  She is also on steroids as well.  She states that despite taking the antibiotic, she has worsening cough and wheezing.  She states that her throat no longer hurts.  She states that she tested negative for COVID during the office visit.  Denies any fevers. ? ?The history is provided by the patient.  ? ?  ? ?Home Medications ?Prior to Admission medications   ?Medication Sig Start Date End Date Taking? Authorizing Provider  ?acetaminophen (TYLENOL) 325 MG tablet Take 650 mg by mouth every 6 (six) hours as needed.    [provider]  ?albuterol (VENTOLIN HFA) 108 (90 Base) MCG/ACT inhaler INHALE 2 PUFFS INTO THE LUNGS EVERY 6 HOURS AS NEEDED FOR WHEEZING OR SHORTNESS OF BREATH 10/01/19   [name], MD  ?azelastine (ASTELIN) 0.1 % nasal spray USE 1 SPRAY IN EACH NOSTRIL TWICE DAILY AS DIRECTED 10/29/21   Althea Charon, FNP  ?azelastine (ASTELIN) 0.1 % nasal spray Place 1 spray in each nostril twice a day as needed for runny nose/drainage down throat 10/29/21   Althea Charon, FNP  ?Blood Glucose Monitoring Suppl ([place]) w/Device KIT Use to check blood glucose once a day.  DX code: E11.9 02/06/20   [name], MD  ?budesonide (PULMICORT) 180 MCG/ACT inhaler Inhale 1-2 puffs into the lungs 2 (two) times daily. 11/19/20   Althea Charon, [place]  ?Cholecalciferol ([employer] VIT D 5000 HIGH-POTENCY PO) Take 5,000 Units by mouth daily.    [provider]  ?estradiol (ESTRACE VAGINAL) 0.1 MG/GM vaginal cream  Place 1 Applicatorful vaginally at bedtime. 04/28/21   Lavonia Drafts, MD  ?fluticasone (FLONASE) 50 MCG/ACT nasal spray SHAKE LIQUID AND USE 2 SPRAYS IN [facility] NOSTRIL DAILY 07/13/21   [name], MD  ?glucose blood (ONETOUCH VERIO) test strip Use to check blood glucose once a day.  DX code: E11.9 02/06/20   [name], MD  ?hyoscyamine (LEVSIN SL) 0.125 MG SL tablet Place 1 tablet (0.125 mg total) under the tongue every 4 (four) hours as needed. 03/02/21   [name], MD  ?Lancets (ONETOUCH DELICA PLUS [id number]) MISC Use to check blood glucose once a day.  DX code: E11.9 02/06/20   [name], MD  ?loratadine (CLARITIN) 10 MG tablet Take 10 mg by mouth in the morning and at bedtime.     [provider]  ?montelukast (SINGULAIR) 10 MG tablet TAKE 1 TABLET(10 MG) BY MOUTH DAILY AS NEEDED ?Patient taking differently: Take 10 mg by mouth at bedtime. 01/26/21   Althea Charon, [place]  ?Omega-3 1000 MG CAPS Take 1,000 mg by mouth daily.    [provider]  ?omeprazole (PRILOSEC) 40 MG capsule Take 1 capsule by mouth daily. 10/28/20   [provider]  ?rosuvastatin (CRESTOR) 5 MG tablet TAKE 1 TAKE ONE TABLET EVERY__ HOURS TAKE ONE TABLET EVERY__ HOURS TAB BY MOUTH 2 TIMES A WEEK(TUE AND SUN WEEKLY) 08/23/21   [name], MD  ?[place]  150 MCG tablet Take 1 tablet (150 mcg total) by mouth daily before breakfast. 10/07/21   [name], MD  ?triamterene-hydrochlorothiazide (MAXZIDE-25) 37.5-25 MG tablet Take 1 tablet by mouth daily. 09/23/21   Debbrah Alar, NP  ?   ? ?Allergies    ?Levaquin [levofloxacin], Penicillins, Clindamycin/lincomycin, Codeine, and Gabapentin   ? ?Review of Systems   ?Review of Systems  ?Respiratory:  Positive for cough.   ?All other systems reviewed and are negative. ? ?Physical Exam ?Updated Vital Signs ?BP (!) 145/100 (BP Location: Left Arm)   Pulse 100   Temp 98.3 ?F (36.8 ?C) (Oral)   Resp 18   Ht _0  (1.575 m)   Wt 90.3 kg    SpO2 98%   BMI 36.40 kg/m?  ?Physical Exam ?Vitals and nursing note reviewed.  ?Constitutional:   ?   Appearance: Normal appearance.  ?HENT:  ?   Head: Normocephalic.  ?   Nose: Nose normal.  ?   Mouth/Throat:  ?   Mouth: Mucous membranes are moist.  ?   Comments: Oropharynx is clear and tonsils are not enlarged and posterior pharynx is not red. ?Eyes:  ?   Extraocular Movements: Extraocular movements intact.  ?   Pupils: Pupils are equal, round, and reactive to light.  ?Cardiovascular:  ?   Rate and Rhythm: Normal rate and regular rhythm.  ?   Pulses: Normal pulses.  ?   Heart sounds: Normal heart sounds.  ?Pulmonary:  ?   Comments: Slightly tachypneic, mild diffuse wheezing. ?Abdominal:  ?   General: Abdomen is flat.  ?   Palpations: Abdomen is soft.  ?Musculoskeletal:     ?   General: Normal range of motion.  ?   Cervical back: Normal range of motion and neck supple.  ?Skin: ?   General: Skin is warm.  ?   Capillary Refill: Capillary refill takes less than 2 seconds.  ?Neurological:  ?   General: No focal deficit present.  ?   Mental Status: She is alert and oriented to person, place, and time.  ?Psychiatric:     ?   Mood and Affect: Mood normal.     ?   Behavior: Behavior normal.  ? ? ?ED Results / Procedures / Treatments   ?Labs ?(all labs ordered are listed, but only abnormal results are displayed) ?Labs Reviewed  ?CBC WITH DIFFERENTIAL/PLATELET - Abnormal; Notable for the following components:  ?    Result Value  ? WBC 13.2 (*)   ? Neutro Abs 10.9 (*)   ? Abs Immature Granulocytes 0.16 (*)   ? All other components within normal limits  ?RESP PANEL BY RT-PCR (FLU A&B, COVID) [id number]  ?COMPREHENSIVE METABOLIC PANEL  ? ? ?EKG ?None ? ?Radiology ?No results found. ? ?Procedures ?Procedures  ? ? ?Medications Ordered in ED ?Medications  ?sodium chloride 0.9 % bolus 1,000 mL (1,000 mLs Intravenous New Bag/Given 11/04/21 1900)  ?methylPREDNISolone sodium succinate (SOLU-MEDROL) 125 mg/2 mL injection 125 mg (125 mg  Intravenous Given 11/04/21 1908)  ?albuterol (PROVENTIL) (2.5 MG/3ML) 0.083% nebulizer solution 5 mg (5 mg Nebulization Given 11/04/21 1922)  ?ipratropium (ATROVENT) nebulizer solution 0.5 mg (0.5 mg Nebulization Given 11/04/21 1922)  ? ? ?ED Course/ Medical Decision Making/ A&P ?  ?                        ?Medical Decision Making ?Monica Wallace is a 71 y.o. female here presenting with cough and malaise  and wheezing.  Patient is already on antibiotic for strep throat. However she has worsening wheezing and cough.  She is also on steroids. Consider asthma exacerbation versus pneumonia versus COVID.  She does not have any signs of strep throat or peritonsillar abscess or obvious retropharyngeal abscess on exam.  We will get CBC and CMP and chest x-ray and will hydrate patient and give Solu-Medrol and albuterol and reassess. ? ?8:04 PM ?COVID and flu test negative.  White blood cell count is 13.  Chest x-ray is clear.  Patient has improved lung movement right now after nebs.  She has no oxygen requirement.  I will increase her steroids and also add Z-Pak in addition to her Keflex to cover atypical organisms.  ? ? ?Problems Addressed: ?Moderate persistent asthma with exacerbation: acute illness or injury ? ?Amount and/or Complexity of Data Reviewed ?Labs: ordered. Decision-making details documented in ED Course. ?Radiology: ordered and independent interpretation performed. Decision-making details documented in ED Course. ?ECG/medicine tests: ordered and independent interpretation performed. Decision-making details documented in ED Course. ? ?Risk ?Prescription drug management. ? ? ?Final Clinical Impression(s) / ED Diagnoses ?Final diagnoses:  ?None  ? ? ?Rx / DC Orders ?ED Discharge Orders   ? ? None  ? ?  ? ? ?  ?Drenda Freeze, MD ?11/04/21 2007 ? ?

## 2021-11-04 NOTE — Discharge Instructions (Addendum)
You likely have asthma exacerbation ? ?I want to increase your steroids as prescribed. ? ?Please continue your Keflex and I added Z-Pak ? ?Use albuterol every 4-6 hours as needed  ? ?See your doctor this week for follow-up ? ?Return to ER if you have worse shortness of breath, cough, fever ?

## 2021-11-05 ENCOUNTER — Other Ambulatory Visit: Payer: Self-pay | Admitting: Family Medicine

## 2021-11-08 DIAGNOSIS — M5412 Radiculopathy, cervical region: Secondary | ICD-10-CM | POA: Diagnosis not present

## 2021-11-08 NOTE — Progress Notes (Signed)
? ?Subjective:  ? ? Patient ID: Monica Wallace, female    DOB: 06-07-1951, 71 y.o.   MRN: [id number] ? ?Chief Complaint  ?Patient presents with  ? Follow-up  ? Asthma  ? ? ?HPI ?Patient is in today for a follow up. ? ?Past Medical History:  ?Diagnosis Date  ? Arthritis   ? Asthma   ? Baker's cyst of knee, right 02/16/2017  ? Cancer [facility]) 2002  ? thyroid (2002)  ? Cystic breast   ? Diabetes ([place])   ? GERD (gastroesophageal reflux disease)   ? Hepatic cyst 02/02/2015  ? Hyperlipidemia   ? Hypertension   ? Insomnia 07/23/2016  ? Menopause   ? Migraine   ? Palpitations   ? Preventative health care 01/18/2016  ? Sleep apnea 02/02/2015  ? Thyroid disease   ? ? ?Past Surgical History:  ?Procedure Laterality Date  ? ABDOMINAL HYSTERECTOMY    ? took right ovary and uterus  ? APPENDECTOMY    ? CARPAL TUNNEL RELEASE Right   ? CESAREAN SECTION    ? CHOLECYSTECTOMY    ? EYE SURGERY Bilateral 2017  ? cataracts  ? KNEE ARTHROPLASTY    ? SHOULDER ARTHROSCOPY W/ ROTATOR CUFF REPAIR Left   ? THYROIDECTOMY  2002  ? ? ?Family History  ?Problem Relation Age of Onset  ? Arthritis Mother   ? Transient ischemic attack Mother   ? Hypertension Mother   ? Hyperlipidemia Mother   ? Diabetes Father   ? Heart disease Father   ? Arthritis Father   ? Kidney disease Father   ? Hypertension Father   ? Hyperlipidemia Father   ? Cancer Sister 18  ?     pancreatic  ? Fibromyalgia Sister   ? Allergic rhinitis Sister   ? Asthma Maternal Grandfather   ?     smoker  ? Depression Maternal Grandfather   ? Dementia Paternal Grandmother   ? Diabetes Paternal Grandfather   ? Hypertension Brother   ? Allergic rhinitis Brother   ? Scoliosis Daughter   ?     had rods placed and removed  ? Arthritis Sister   ? Gout Sister   ? GER disease Sister   ? Cancer Sister   ?     breast cancer  ? Breast cancer Sister 46  ? ? ?Social History  ? ?Socioeconomic History  ? Marital status: Divorced  ?  Spouse name: Not on file  ? Number of children: 2  ? Years of education: Not on  file  ? Highest education level: Not on file  ?Occupational History  ? Not on file  ?Tobacco Use  ? Smoking status: Never  ? Smokeless tobacco: Never  ?Vaping Use  ? Vaping Use: Never used  ?Substance and Sexual Activity  ? Alcohol use: Yes  ?  Alcohol/week: 0.0 standard drinks  ?  Comment: very rare once or twice a year  ? Drug use: No  ? Sexual activity: Yes  ?  Birth control/protection: Other-see comments, Surgical  ?  Comment:  boyfriend  ?Other Topics Concern  ? Not on file  ?Social History Narrative  ? Household: pt, daughter and sister   ? ?Social Determinants of Health  ? ?Financial Resource Strain: Low Risk   ? Difficulty of Paying Living Expenses: Not hard at all  ?Food Insecurity: No Food Insecurity  ? Worried About Charity fundraiser in the Last Year: Never true  ? Ran Out of Food in the Last  Year: Never true  ?Transportation Needs: No Transportation Needs  ? Lack of Transportation (Medical): No  ? Lack of Transportation (Non-Medical): No  ?Physical Activity: Inactive  ? Days of Exercise per Week: 0 days  ? Minutes of Exercise per Session: 0 min  ?Stress: No Stress Concern Present  ? Feeling of Stress : Not at all  ?Social Connections: Moderately Isolated  ? Frequency of Communication with Friends and Family: More than three times a week  ? Frequency of Social Gatherings with Friends and Family: More than three times a week  ? Attends Religious Services: More than 4 times per year  ? Active Member of Clubs or Organizations: No  ? Attends Archivist Meetings: Never  ? Marital Status: Divorced  ?Intimate Partner Violence: Not At Risk  ? Fear of Current or Ex-Partner: No  ? Emotionally Abused: No  ? Physically Abused: No  ? Sexually Abused: No  ? ? ?Outpatient Medications Prior to Visit  ?Medication Sig Dispense Refill  ? acetaminophen (TYLENOL) 325 MG tablet Take 650 mg by mouth every 6 (six) hours as needed.    ? albuterol (PROVENTIL) (2.5 MG/3ML) 0.083% nebulizer solution Take 3 mLs (2.5 mg  total) by nebulization every 6 (six) hours as needed for wheezing or shortness of breath. 75 mL 0  ? albuterol (VENTOLIN HFA) 108 (90 Base) MCG/ACT inhaler INHALE 2 PUFFS INTO THE LUNGS EVERY 6 HOURS AS NEEDED FOR WHEEZING OR SHORTNESS OF BREATH 25.5 g 1  ? azelastine (ASTELIN) 0.1 % nasal spray USE 1 SPRAY IN EACH NOSTRIL TWICE DAILY AS DIRECTED 30 mL 0  ? azelastine (ASTELIN) 0.1 % nasal spray Place 1 spray in each nostril twice a day as needed for runny nose/drainage down throat 30 mL 0  ? azithromycin (ZITHROMAX) 250 MG tablet Take 1 tablet (250 mg total) by mouth daily. Take first 2 tablets together, then 1 every day until finished. 6 tablet 0  ? Blood Glucose Monitoring Suppl ([place]) w/Device KIT Use to check blood glucose once a day.  DX code: E11.9 1 kit 0  ? budesonide (PULMICORT) 180 MCG/ACT inhaler Inhale 1-2 puffs into the lungs 2 (two) times daily. 3 each 1  ? Cholecalciferol (CVS VIT D 5000 HIGH-POTENCY PO) Take 5,000 Units by mouth daily.    ? estradiol (ESTRACE VAGINAL) 0.1 MG/GM vaginal cream Place 1 Applicatorful vaginally at bedtime. 42.5 g 12  ? fluticasone (FLONASE) 50 MCG/ACT nasal spray SHAKE LIQUID AND USE 2 SPRAYS IN EACH NOSTRIL DAILY 48 g 1  ? glucose blood (ONETOUCH VERIO) test strip TEST BLOOD GLUCOSE ONCE A DAY 100 strip 1  ? hyoscyamine (LEVSIN SL) 0.125 MG SL tablet Place 1 tablet (0.125 mg total) under the tongue every 4 (four) hours as needed. 30 tablet 1  ? Lancets (ONETOUCH DELICA PLUS [id number]) MISC TEST BLOOD SUGAR ONCE A DAY 100 each 1  ? loratadine (CLARITIN) 10 MG tablet Take 10 mg by mouth in the morning and at bedtime.     ? montelukast (SINGULAIR) 10 MG tablet TAKE 1 TABLET(10 MG) BY MOUTH DAILY AS NEEDED (Patient taking differently: Take 10 mg by mouth at bedtime.) 90 tablet 1  ? Omega-3 1000 MG CAPS Take 1,000 mg by mouth daily.    ? omeprazole (PRILOSEC) 40 MG capsule Take 1 capsule by mouth daily.    ? predniSONE (DELTASONE) 20 MG tablet Take 60  mg daily x 2 days then 40 mg daily x 2 days then 20  mg daily x 2 days 12 tablet 0  ? rosuvastatin (CRESTOR) 5 MG tablet TAKE 1 TAKE ONE TABLET EVERY__ HOURS TAKE ONE TABLET EVERY__ HOURS TAB BY MOUTH 2 TIMES A WEEK(TUE AND SUN WEEKLY) 25 tablet 1  ? triamterene-hydrochlorothiazide (MAXZIDE-25) 37.5-25 MG tablet Take 1 tablet by mouth daily. 90 tablet 0  ? SYNTHROID 150 MCG tablet Take 1 tablet (150 mcg total) by mouth daily before breakfast. 90 tablet 1  ? ?No facility-administered medications prior to visit.  ? ? ?Allergies  ?Allergen Reactions  ? Levaquin [Levofloxacin] Shortness Of Breath  ? Penicillins Other (See Comments)  ?  Positive on allergy testing 'years ago.' Has never had penicillin. Has tolerated amoxicillin.  ? Clindamycin/Lincomycin Other (See Comments)  ?  Pruritus ?No rash  ? Codeine Itching  ? Gabapentin Itching  ? ? ?Review of Systems  ?Constitutional:  Positive for malaise/fatigue. Negative for fever.  ?HENT:  Positive for congestion.   ?Eyes:  Negative for blurred vision.  ?Respiratory:  Positive for cough, sputum production and shortness of breath.   ?Cardiovascular:  Negative for chest pain, palpitations and leg swelling.  ?Gastrointestinal:  Negative for abdominal pain, blood in stool and nausea.  ?Genitourinary:  Negative for dysuria and frequency.  ?Musculoskeletal:  Negative for falls.  ?Skin:  Negative for rash.  ?Neurological:  Positive for focal weakness and weakness. Negative for dizziness, loss of consciousness and headaches.  ?Endo/Heme/Allergies:  Negative for environmental allergies.  ?Psychiatric/Behavioral:  Negative for depression. The patient is not nervous/anxious.   ? ?   ?Objective:  ?  ?Physical Exam ?Constitutional:   ?   General: She is not in acute distress. ?   Appearance: She is well-developed.  ?HENT:  ?   Head: Normocephalic and atraumatic.  ?Eyes:  ?   Conjunctiva/sclera: Conjunctivae normal.  ?Neck:  ?   Thyroid: No thyromegaly.  ?Cardiovascular:  ?   Rate and  Rhythm: Normal rate and regular rhythm.  ?   Heart sounds: Normal heart sounds. No murmur heard. ?Pulmonary:  ?   Effort: Pulmonary effort is normal. No respiratory distress.  ?   Breath sounds: Normal brea

## 2021-11-09 ENCOUNTER — Ambulatory Visit (INDEPENDENT_AMBULATORY_CARE_PROVIDER_SITE_OTHER): Payer: Medicare Other | Admitting: Family Medicine

## 2021-11-09 ENCOUNTER — Encounter: Payer: Self-pay | Admitting: Family Medicine

## 2021-11-09 VITALS — BP 126/78 | HR 107 | Resp 20 | Ht 62.0 in | Wt 204.0 lb

## 2021-11-09 DIAGNOSIS — E7800 Pure hypercholesterolemia, unspecified: Secondary | ICD-10-CM | POA: Diagnosis not present

## 2021-11-09 DIAGNOSIS — R2681 Unsteadiness on feet: Secondary | ICD-10-CM

## 2021-11-09 DIAGNOSIS — J45909 Unspecified asthma, uncomplicated: Secondary | ICD-10-CM | POA: Diagnosis not present

## 2021-11-09 DIAGNOSIS — I10 Essential (primary) hypertension: Secondary | ICD-10-CM | POA: Diagnosis not present

## 2021-11-09 DIAGNOSIS — E119 Type 2 diabetes mellitus without complications: Secondary | ICD-10-CM

## 2021-11-09 DIAGNOSIS — J4540 Moderate persistent asthma, uncomplicated: Secondary | ICD-10-CM

## 2021-11-09 DIAGNOSIS — E890 Postprocedural hypothyroidism: Secondary | ICD-10-CM

## 2021-11-09 DIAGNOSIS — B379 Candidiasis, unspecified: Secondary | ICD-10-CM

## 2021-11-09 DIAGNOSIS — T3695XA Adverse effect of unspecified systemic antibiotic, initial encounter: Secondary | ICD-10-CM | POA: Diagnosis not present

## 2021-11-09 LAB — CBC WITH DIFFERENTIAL/PLATELET
Basophils Absolute: 0 10*3/uL (ref 0.0–0.1)
Basophils Relative: 0.2 % (ref 0.0–3.0)
Eosinophils Absolute: 0.1 10*3/uL (ref 0.0–0.7)
Eosinophils Relative: 0.5 % (ref 0.0–5.0)
HCT: 42.4 % (ref 36.0–46.0)
Hemoglobin: 13.5 g/dL (ref 12.0–15.0)
Lymphocytes Relative: 34.5 % (ref 12.0–46.0)
Lymphs Abs: 5.3 10*3/uL — ABNORMAL HIGH (ref 0.7–4.0)
MCHC: 31.8 g/dL (ref 30.0–36.0)
MCV: 92.3 fl (ref 78.0–100.0)
Monocytes Absolute: 1.1 10*3/uL — ABNORMAL HIGH (ref 0.1–1.0)
Monocytes Relative: 6.9 % (ref 3.0–12.0)
Neutro Abs: 8.9 10*3/uL — ABNORMAL HIGH (ref 1.4–7.7)
Neutrophils Relative %: 57.9 % (ref 43.0–77.0)
Platelets: 313 10*3/uL (ref 150.0–400.0)
RBC: 4.59 Mil/uL (ref 3.87–5.11)
RDW: 13.7 % (ref 11.5–15.5)
WBC: 15.4 10*3/uL — ABNORMAL HIGH (ref 4.0–10.5)

## 2021-11-09 LAB — COMPREHENSIVE METABOLIC PANEL
ALT: 37 U/L — ABNORMAL HIGH (ref 0–35)
AST: 27 U/L (ref 0–37)
Albumin: 3.8 g/dL (ref 3.5–5.2)
Alkaline Phosphatase: 74 U/L (ref 39–117)
BUN: 20 mg/dL (ref 6–23)
CO2: 28 mEq/L (ref 19–32)
Calcium: 9.5 mg/dL (ref 8.4–10.5)
Chloride: 98 mEq/L (ref 96–112)
Creatinine, Ser: 0.87 mg/dL (ref 0.40–1.20)
GFR: 66.42 mL/min (ref >60.00)
Glucose, Bld: 151 mg/dL — ABNORMAL HIGH (ref 70–99)
Potassium: 3.2 mEq/L — ABNORMAL LOW (ref 3.5–5.1)
Sodium: 137 mEq/L (ref 135–145)
Total Bilirubin: 0.4 mg/dL (ref 0.2–1.2)
Total Protein: 6.7 g/dL (ref 6.0–8.3)

## 2021-11-09 LAB — TSH: TSH: 0.07 u[IU]/mL — ABNORMAL LOW (ref 0.35–5.50)

## 2021-11-09 MED ORDER — FLUCONAZOLE 150 MG PO TABS: 150.0000 mg | ORAL_TABLET | ORAL | 1 refills | Status: DC

## 2021-11-09 NOTE — Patient Instructions (Signed)
Hypertension, Adult High blood pressure (hypertension) is when the force of blood pumping through the arteries is too strong. The arteries are the blood vessels that carry blood from the heart throughout the body. Hypertension forces the heart to work harder to pump blood and may cause arteries to become narrow or stiff. Untreated or uncontrolled hypertension can lead to a heart attack, heart failure, a stroke, kidney disease, and other problems. A blood pressure reading consists of a higher number over a lower number. Ideally, your blood pressure should be below 120/80. The first ("top") number is called the systolic pressure. It is a measure of the pressure in your arteries as your heart beats. The second ("bottom") number is called the diastolic pressure. It is a measure of the pressure in your arteries as the heart relaxes. What are the causes? The exact cause of this condition is not known. There are some conditions that result in high blood pressure. What increases the risk? Certain factors may make you more likely to develop high blood pressure. Some of these risk factors are under your control, including: Smoking. Not getting enough exercise or physical activity. Being overweight. Having too much fat, sugar, calories, or salt (sodium) in your diet. Drinking too much alcohol. Other risk factors include: Having a personal history of heart disease, diabetes, high cholesterol, or kidney disease. Stress. Having a family history of high blood pressure and high cholesterol. Having obstructive sleep apnea. Age. The risk increases with age. What are the signs or symptoms? High blood pressure may not cause symptoms. Very high blood pressure (hypertensive crisis) may cause: Headache. Fast or irregular heartbeats (palpitations). Shortness of breath. Nosebleed. Nausea and vomiting. Vision changes. Severe chest pain, dizziness, and seizures. How is this diagnosed? This condition is diagnosed by  measuring your blood pressure while you are seated, with your arm resting on a flat surface, your legs uncrossed, and your feet flat on the floor. The cuff of the blood pressure monitor will be placed directly against the skin of your upper arm at the level of your heart. Blood pressure should be measured at least twice using the same arm. Certain conditions can cause a difference in blood pressure between your right and left arms. If you have a high blood pressure reading during one visit or you have normal blood pressure with other risk factors, you may be asked to: Return on a different day to have your blood pressure checked again. Monitor your blood pressure at home for 1 week or longer. If you are diagnosed with hypertension, you may have other blood or imaging tests to help your health care provider understand your overall risk for other conditions. How is this treated? This condition is treated by making healthy lifestyle changes, such as eating healthy foods, exercising more, and reducing your alcohol intake. You may be referred for counseling on a healthy diet and physical activity. Your health care provider may prescribe medicine if lifestyle changes are not enough to get your blood pressure under control and if: Your systolic blood pressure is above 130. Your diastolic blood pressure is above 80. Your personal target blood pressure may vary depending on your medical conditions, your age, and other factors. Follow these instructions at home: Eating and drinking  Eat a diet that is high in fiber and potassium, and low in sodium, added sugar, and fat. An example of this eating plan is called the DASH diet. DASH stands for Dietary Approaches to Stop Hypertension. To eat this way: Eat   plenty of fresh fruits and vegetables. Try to fill one half of your plate at each meal with fruits and vegetables. Eat whole grains, such as whole-wheat pasta, brown rice, or whole-grain bread. Fill about one  fourth of your plate with whole grains. Eat or drink low-fat dairy products, such as skim milk or low-fat yogurt. Avoid fatty cuts of meat, processed or cured meats, and poultry with skin. Fill about one fourth of your plate with lean proteins, such as fish, chicken without skin, beans, eggs, or tofu. Avoid pre-made and processed foods. These tend to be higher in sodium, added sugar, and fat. Reduce your daily sodium intake. Many people with hypertension should eat less than 1,500 mg of sodium a day. Do not drink alcohol if: Your health care provider tells you not to drink. You are pregnant, may be pregnant, or are planning to become pregnant. If you drink alcohol: Limit how much you have to: 0-1 drink a day for women. 0-2 drinks a day for men. Know how much alcohol is in your drink. In the [place] one drink equals one 12 oz bottle of beer (355 mL), one 5 oz glass of wine (148 mL), or one 1 oz glass of hard liquor (44 mL). Lifestyle  Work with your health care provider to maintain a healthy body weight or to lose weight. Ask what an ideal weight is for you. Get at least 30 minutes of exercise that causes your heart to beat faster (aerobic exercise) most days of the week. Activities may include walking, swimming, or biking. Include exercise to strengthen your muscles (resistance exercise), such as Pilates or lifting weights, as part of your weekly exercise routine. Try to do these types of exercises for 30 minutes at least 3 days a week. Do not use any products that contain nicotine or tobacco. These products include cigarettes, chewing tobacco, and vaping devices, such as e-cigarettes. If you need help quitting, ask your health care provider. Monitor your blood pressure at home as told by your health care provider. Keep all follow-up visits. This is important. Medicines Take over-the-counter and prescription medicines only as told by your health care provider. Follow directions carefully. Blood  pressure medicines must be taken as prescribed. Do not skip doses of blood pressure medicine. Doing this puts you at risk for problems and can make the medicine less effective. Ask your health care provider about side effects or reactions to medicines that you should watch for. Contact a health care provider if you: Think you are having a reaction to a medicine you are taking. Have headaches that keep coming back (recurring). Feel dizzy. Have swelling in your ankles. Have trouble with your vision. Get help right away if you: Develop a severe headache or confusion. Have unusual weakness or numbness. Feel faint. Have severe pain in your chest or abdomen. Vomit repeatedly. Have trouble breathing. These symptoms may be an emergency. Get help right away. Call 911. Do not wait to see if the symptoms will go away. Do not drive yourself to the hospital. Summary Hypertension is when the force of blood pumping through your arteries is too strong. If this condition is not controlled, it may put you at risk for serious complications. Your personal target blood pressure may vary depending on your medical conditions, your age, and other factors. For most people, a normal blood pressure is less than 120/80. Hypertension is treated with lifestyle changes, medicines, or a combination of both. Lifestyle changes include losing weight, eating a healthy,   low-sodium diet, exercising more, and limiting alcohol. This information is not intended to replace advice given to you by your health care provider. Make sure you discuss any questions you have with your health care provider. Document Revised: 05/11/2021 Document Reviewed: 05/11/2021 Elsevier Patient Education  2023 [employer]  

## 2021-11-10 ENCOUNTER — Other Ambulatory Visit: Payer: Self-pay

## 2021-11-10 ENCOUNTER — Telehealth: Payer: Self-pay | Admitting: *Deleted

## 2021-11-10 DIAGNOSIS — E876 Hypokalemia: Secondary | ICD-10-CM

## 2021-11-10 MED ORDER — POTASSIUM CHLORIDE CRYS ER 20 MEQ PO TBCR: EXTENDED_RELEASE_TABLET | ORAL | 3 refills | Status: AC

## 2021-11-10 MED ORDER — LEVOTHYROXINE SODIUM 137 MCG PO TABS: 137.0000 ug | ORAL_TABLET | Freq: Every day | ORAL | 5 refills | Status: DC

## 2021-11-10 NOTE — Chronic Care Management (AMB) (Signed)
?  Chronic Care Management  ? ?Note ? ?11/10/2021 ?Name: Monica Wallace MRN: [id number] DOB: 1951-05-22 ? ?Monica Wallace is a 71 y.o. year old female who is a primary care patient of [name], MD. Monica Wallace is currently enrolled in care management services. An additional referral for Pharm D was placed.  ? ?Follow up plan: ?Telephone appointment with care management team member scheduled for: 11/23/2021 ? ?Courvoisier Hamblen, CCMA ?Care Guide, Embedded Care Coordination ?[facility]  ?Direct Dial: [phone] ? ? ?

## 2021-11-11 ENCOUNTER — Other Ambulatory Visit: Payer: Self-pay

## 2021-11-11 ENCOUNTER — Encounter: Payer: Self-pay | Admitting: Family Medicine

## 2021-11-11 ENCOUNTER — Encounter: Payer: Self-pay | Admitting: Neurology

## 2021-11-11 ENCOUNTER — Telehealth: Payer: Self-pay | Admitting: Neurology

## 2021-11-11 NOTE — Telephone Encounter (Signed)
Pt states she received a message from [place], [place] asking for a call back, please call. ?

## 2021-11-11 NOTE — Telephone Encounter (Signed)
Left message for patient to return my call.

## 2021-11-11 NOTE — Telephone Encounter (Signed)
I spoke to the patient. She would like to Dr. April Manson when he returns. Her mychart message has been forwarded to his in box. ?

## 2021-11-11 NOTE — Telephone Encounter (Signed)
Dr. Jabier Mutton patient. He is out this week. The patient sent a mychart message that work-in MD will address (Dr. Krista Blue). We will call her back.  ?

## 2021-11-12 ENCOUNTER — Ambulatory Visit: Payer: Medicare Other | Admitting: Family

## 2021-11-14 DIAGNOSIS — R2681 Unsteadiness on feet: Secondary | ICD-10-CM | POA: Insufficient documentation

## 2021-11-14 NOTE — Assessment & Plan Note (Signed)
With right sided weakness in arm and let. She has had EMG of arm but feels her leg is also getting weaker. Consider EMG of leg. Has been seen by ortho and had some PT. She is very weak and having trouble working and she is considering a leave of absence from work so she can rest and work on getting stronger.  ?

## 2021-11-14 NOTE — Assessment & Plan Note (Signed)
Well controlled, no changes to meds. Encouraged heart healthy diet such as the DASH diet and exercise as tolerated.  °

## 2021-11-14 NOTE — Assessment & Plan Note (Signed)
Has been treated by ER and UC with antibiotics and steroids and is improving some but needs some help with medication assistance as her meds have gotten too expensive. Referred to pharmacy for assistance.  ?

## 2021-11-14 NOTE — Assessment & Plan Note (Signed)
Encourage heart healthy diet such as MIND or DASH diet, increase exercise, avoid trans fats, simple carbohydrates and processed foods, consider a krill or fish or flaxseed oil cap daily.  Tolerating Rosuvastatin 

## 2021-11-14 NOTE — Assessment & Plan Note (Signed)
On Levothyroxine, continue to monitor 

## 2021-11-14 NOTE — Assessment & Plan Note (Signed)
hgba1c acceptable, minimize simple carbs. Increase exercise as tolerated. Continue current meds 

## 2021-11-15 ENCOUNTER — Other Ambulatory Visit: Payer: Self-pay | Admitting: Neurology

## 2021-11-15 ENCOUNTER — Telehealth: Payer: Self-pay | Admitting: Pulmonary Disease

## 2021-11-15 ENCOUNTER — Telehealth: Payer: Medicare Other

## 2021-11-15 DIAGNOSIS — R29898 Other symptoms and signs involving the musculoskeletal system: Secondary | ICD-10-CM

## 2021-11-15 DIAGNOSIS — G4733 Obstructive sleep apnea (adult) (pediatric): Secondary | ICD-10-CM | POA: Diagnosis not present

## 2021-11-15 DIAGNOSIS — M5412 Radiculopathy, cervical region: Secondary | ICD-10-CM

## 2021-11-15 DIAGNOSIS — R269 Unspecified abnormalities of gait and mobility: Secondary | ICD-10-CM

## 2021-11-15 NOTE — Telephone Encounter (Signed)
Call patient ? ?Sleep study result ? ?Date of study: ?10/22/2021 ? ?Impression: ?Moderate obstructive sleep apnea ?Severe oxygen desaturations ? ?Recommendation: ?DME referral ? ?Recommend CPAP therapy for moderate obstructive sleep apnea ? ?Auto titrating CPAP with pressure settings of 5-15 will be appropriate ? ?Encourage weight loss measures ? ?Follow-up in the office 4 to 6 weeks following initiation of treatment ? ?

## 2021-11-16 NOTE — Telephone Encounter (Signed)
I called the patient and she does not want to proceed with the CPAP at this time. She wants to think over it and call us back. She wants to focus on her for the time management.  ?

## 2021-11-17 ENCOUNTER — Other Ambulatory Visit (INDEPENDENT_AMBULATORY_CARE_PROVIDER_SITE_OTHER): Payer: Medicare Other

## 2021-11-17 ENCOUNTER — Telehealth: Payer: Self-pay | Admitting: Neurology

## 2021-11-17 ENCOUNTER — Telehealth: Payer: Self-pay | Admitting: Pharmacist

## 2021-11-17 DIAGNOSIS — E876 Hypokalemia: Secondary | ICD-10-CM

## 2021-11-17 MED ORDER — SYNTHROID 175 MCG PO TABS
175.0000 ug | ORAL_TABLET | Freq: Every day | ORAL | 1 refills | Status: DC
Start: 2021-11-17 — End: 2021-11-23

## 2021-11-17 NOTE — Telephone Encounter (Signed)
Sent to [facility] ph # [phone] ?

## 2021-11-17 NOTE — Chronic Care Management (AMB) (Signed)
? ? ?Chronic Care Management ?Pharmacy Assistant  ? ?Name: Monica Wallace  MRN: [id number] DOB: 1951-01-08 ? ?Monica Wallace is an 71 y.o. year old female who presents for his initial CCM visit with the clinical pharmacist. ? ?Recent office visits:  ?11/09/21-Stacey A. Charlett Blake, MD (PCP) Asthma follow up visit. Labs ordered. Start on Diflucan 150 mg for yeast infection. AMB Referral to [facility]. Follow up in 3 months. ?09/07/21-Melissa Inda Castle, NP. Seen for general follow up visit. MRI lumbar spine ordered. Labs ordered. Follow up in 3 months. ?07/20/21-Jose Ladona Horns, MD. General follow up visit. Start on Take KCl 10 mEq 1 tablet daily for 10 days. Keep the appointment to see neurologist.  ? ?Recent consult visits:  ?10/21/21-Amadou Camara, MD (Neurology) General follow up visit. AMB referral orthopedics. Ambulatory referral physical therapy. Follow up in 6 months. ?10/06/21-Brian S. Stanford Breed, MD (Cardiology) Follow up presyncope. Follow up as needed. ?09/16/21-Robert Noemi Chapel (Orthopedic Surgery)Notes not available.  ?08/16/21-Adewale A. Burnett Harry, MD (Pulmonology) Patient being evaluated for obstructive sleep apnea. Follow up in 4 months. ?07/21/21-Amadou Camara, MD (Neurology) General follow up visit. MRI ordered of cervical spine. Follow up in 3 months. ?06/29/21-Amadou April Manson, MD (Neurology) Initial visit. Labs ordered. Return if symptoms worsen or fail to improve. ?06/08/21-Ibtehal Nena Jordan (Endocrinology) Follow up visit. Stop fluconazole 150 mg. Follow up in 6 months. ?06/01/21-Elyshia Rosalyn Gess (Internal medicine) Notes not available.  ?05/26/21-Brian S. Stanford Breed, MD (Cardiology) Initial visit. Stop potasium and stop Triam/HCT. Follow up in 4 months. ? ?Hospital visits:  ?Admitted to the hospital on 11/04/21 due to cough. Discharge date was 11/04/21. Discharged from [facility].   ? ?New?Medications Started at [facility] Discharge:?? ?-started none noted ? ?Medication Changes  at Hospital Discharge: ?-Changed none noted ? ?Medications Discontinued at Hospital Discharge: ?-Stopped none noted ? ?Medications that remain the same after Hospital Discharge:??  ?-All other medications will remain the same.   ? ? ?Admitted to the hospital on 12/30/222 due to numbness. Discharge date was 07/16/21. Discharged from [facility].   ? ?New?Medications Started at [facility] Discharge:?? ?-started none noted ? ?Medication Changes at Hospital Discharge: ?-Changed none noted ? ?Medications Discontinued at Hospital Discharge: ?-Stopped none noted ? ?Medications that remain the same after Hospital Discharge:??  ?-All other medications will remain the same. ? ? ?Admitted to the hospital on 06/18/21 due to weakness. Discharge date was 06/18/21. Discharged from [facility] [place].   ? ?New?Medications Started at [facility] Discharge:?? ?-started none noted ? ?Medication Changes at Hospital Discharge: ?-Changed none noted ? ?Medications Discontinued at Hospital Discharge: ?-Stopped none noted ? ?Medications that remain the same after Hospital Discharge:??  ?-All other medications will remain the same. ?Medications: ?Outpatient Encounter Medications as of 11/17/2021  ?Medication Sig Note  ? acetaminophen (TYLENOL) 325 MG tablet Take 650 mg by mouth every 6 (six) hours as needed.   ? albuterol (PROVENTIL) (2.5 MG/3ML) 0.083% nebulizer solution Take 3 mLs (2.5 mg total) by nebulization every 6 (six) hours as needed for wheezing or shortness of breath.   ? albuterol (VENTOLIN HFA) 108 (90 Base) MCG/ACT inhaler INHALE 2 PUFFS INTO THE LUNGS EVERY 6 HOURS AS NEEDED FOR WHEEZING OR SHORTNESS OF BREATH   ? azelastine (ASTELIN) 0.1 % nasal spray USE 1 SPRAY IN EACH NOSTRIL TWICE DAILY AS DIRECTED   ? azelastine (ASTELIN) 0.1 % nasal spray Place 1 spray in each nostril twice a day as needed for runny nose/drainage down throat   ? azithromycin (ZITHROMAX) 250  MG tablet Take 1 tablet (250 mg total) by mouth  daily. Take first 2 tablets together, then 1 every day until finished.   ? Blood Glucose Monitoring Suppl ([place]) w/Device KIT Use to check blood glucose once a day.  DX code: E11.9   ? budesonide (PULMICORT) 180 MCG/ACT inhaler Inhale 1-2 puffs into the lungs 2 (two) times daily.   ? Cholecalciferol ([employer] VIT D 5000 HIGH-POTENCY PO) Take 5,000 Units by mouth daily.   ? estradiol (ESTRACE VAGINAL) 0.1 MG/GM vaginal cream Place 1 Applicatorful vaginally at bedtime.   ? fluconazole (DIFLUCAN) 150 MG tablet Take 1 tablet (150 mg total) by mouth once a week.   ? fluticasone (FLONASE) 50 MCG/ACT nasal spray SHAKE LIQUID AND USE 2 SPRAYS IN EACH NOSTRIL DAILY   ? glucose blood (ONETOUCH VERIO) test strip TEST BLOOD GLUCOSE ONCE A DAY   ? hyoscyamine (LEVSIN SL) 0.125 MG SL tablet Place 1 tablet (0.125 mg total) under the tongue every 4 (four) hours as needed.   ? Lancets (ONETOUCH DELICA PLUS MNOTRR11A) MISC TEST BLOOD SUGAR ONCE A DAY   ? loratadine (CLARITIN) 10 MG tablet Take 10 mg by mouth in the morning and at bedtime.  06/03/2021: Reports taking once a day.  ? montelukast (SINGULAIR) 10 MG tablet TAKE 1 TABLET(10 MG) BY MOUTH DAILY AS NEEDED (Patient taking differently: Take 10 mg by mouth at bedtime.) 06/03/2021: Reports takes every night  ? Omega-3 1000 MG CAPS Take 1,000 mg by mouth daily.   ? omeprazole (PRILOSEC) 40 MG capsule Take 1 capsule by mouth daily.   ? potassium chloride SA (KLOR-CON M) 20 MEQ tablet TAKE 2 TABLETS BY MOUTH DAILY FOR 3 DAYS. THEN TAKE 1 TABLET BY MOUTH DAILY.   ? predniSONE (DELTASONE) 20 MG tablet Take 60 mg daily x 2 days then 40 mg daily x 2 days then 20 mg daily x 2 days   ? rosuvastatin (CRESTOR) 5 MG tablet TAKE 1 TAKE ONE TABLET EVERY__ HOURS TAKE ONE TABLET EVERY__ HOURS TAB BY MOUTH 2 TIMES A WEEK(TUE AND SUN WEEKLY)   ? SYNTHROID 175 MCG tablet Take 1 tablet (175 mcg total) by mouth daily before breakfast.   ? triamterene-hydrochlorothiazide (MAXZIDE-25)  37.5-25 MG tablet Take 1 tablet by mouth daily.   ? ?No facility-administered encounter medications on file as of 11/17/2021.  ? ?Acetaminophen (TYLENOL) 325 MG tablet Last filled:None noted ?Albuterol (PROVENTIL) (2.5 MG/3ML) 0.083% nebulizer solution Last filled:None noted ?Albuterol (VENTOLIN HFA) 108 (90 Base) MCG/ACT inhaler Last filled:10/01/19 75 DS ?Azelastine (ASTELIN) 0.1 % nasal spray Last filled:10/29/21 50 DS ?Azithromycin (ZITHROMAX) 250 MG tablet Last filled:11/04/21 5 DS ?Budesonide (PULMICORT) 180 MCG/ACT inhaler Last filled:05/19/21 30 DS ?Cholecalciferol ([employer] VIT D 5000 HIGH-POTENCY PO) Last filled:None noted ?Estradiol (ESTRACE VAGINAL) 0.1 MG/GM vaginal cream Last filled:04/28/21 90 DS ?Fluconazole (DIFLUCAN) 150 MG tablet Last filled:11/09/21 2 DS ?Fluticasone (FLONASE) 50 MCG/ACT nasal spray Last filled:07/13/21 90 DS ?Hyoscyamine (LEVSIN SL) 0.125 MG SL tablet Last filled:None noted ?Loratadine (CLARITIN) 10 MG tablet Last filled:None noted ?Montelukast (SINGULAIR) 10 MG tablet Last filled:09/01/21 90 DS ?Omega-3 1000 MG CAPS Last filled:None noted ?Omeprazole (PRILOSEC) 40 MG capsule Last filled:10/10/21 90 DS ?Potassium chloride SA (KLOR-CON M) 20 MEQ tablet Last filled:11/10/21 90 DS ?PredniSONE (DELTASONE) 20 MG tablet Last filled:11/04/21 6 DS ?Rosuvastatin (CRESTOR) 5 MG tablet Last filled:08/23/21 90 DS ?SYNTHROID 175 MCG tablet Last filled:None noted ?Triamterene-hydrochlorothiazide (MAXZIDE-25) 37.5-25 MG tablet Last filled:11/05/21 90 DS ? ? ?Care Gaps: ?URINE MICROALBUMIN:Never  done ?Zoster Vaccines- Shingrix:Never done ?COLON CANCER SCREENING ANNUAL FOBT:Last completed: Jul 01, 2014 ? ?Star Rating Drugs: ?Rosuvastatin (CRESTOR) 5 MG tablet Last filled:08/23/21 90 DS ? ?Corrie Mckusick, RMA ?Health Concierge ? ?

## 2021-11-18 ENCOUNTER — Encounter: Payer: Medicare Other | Admitting: Physical Therapy

## 2021-11-18 LAB — COMPREHENSIVE METABOLIC PANEL
ALT: 27 U/L (ref 0–35)
AST: 22 U/L (ref 0–37)
Albumin: 3.9 g/dL (ref 3.5–5.2)
Alkaline Phosphatase: 70 U/L (ref 39–117)
BUN: 17 mg/dL (ref 6–23)
CO2: 30 mEq/L (ref 19–32)
Calcium: 9.4 mg/dL (ref 8.4–10.5)
Chloride: 98 mEq/L (ref 96–112)
Creatinine, Ser: 0.87 mg/dL (ref 0.40–1.20)
GFR: 66.41 mL/min (ref >60.00)
Glucose, Bld: 107 mg/dL — ABNORMAL HIGH (ref 70–99)
Potassium: 3.6 mEq/L (ref 3.5–5.1)
Sodium: 139 mEq/L (ref 135–145)
Total Bilirubin: 0.3 mg/dL (ref 0.2–1.2)
Total Protein: 6.6 g/dL (ref 6.0–8.3)

## 2021-11-18 NOTE — Patient Instructions (Addendum)
Mild persistent asthma (stopped Pulmicort due to cost in January 2023- has an appointment with pharmacist next week to discuss cost and options) ?Start Pulmicort 0.5 mg twice a day via nebulizer to help prevent cough and wheeze ?Continue montelukast (Singulair) 10 mg once a day to help prevent cough and wheeze ?May use albuterol 2 puffs every 4-6 hours as needed for cough, wheeze, tightness in chest, shortness of breath.  Also may use albuterol 2 puffs 5 to 15 minutes prior to exercise ?We are going to refer you to pulmonology due to restrictive lung disease ?Continue Diflucan as directed by your primary care physician ? ?Allergic rhinoconjunctivitis (2021 skin testing shows intradermal's mildly reactive to grass pollen, tree pollen, mold mix #4, and dust mite) ?Did not get  repeat immune lab work 4-6 weeks after pneumovax due to cost. (Had pneumovax 01/14/2021). No sinus infections since last appointment ?Continue azelastine nasal spray 1 spray each nostril twice a day as needed for runny nose/stuffy nose/drainage down throat ?Continue fluticasone 1-2 sprays in each nostril once a day as needed for stuffy nose ?Continue Claritin (loratadine) 10 mg once a day as needed ?May use sinus rinse as needed for nasal symptoms.  Use this prior to any medicated nasal sprays ?For thick postnasal drip may add on guaifenesin (Mucinex) 600 to 1200 mg twice a day as needed.  Make sure to drink plenty of fluids. ?May use Pataday 1 drop each eye once a day as needed for itchy watery eyes ? ?Reflux ?Continue omeprazole 40 mg once a day to help prevent cough and wheeze ? ?Please let us know if this treatment plan is not working well for you ?Schedule a follow-up appointment in 4 weeks or sooner if needed ?

## 2021-11-19 ENCOUNTER — Ambulatory Visit (INDEPENDENT_AMBULATORY_CARE_PROVIDER_SITE_OTHER): Payer: Medicare Other | Admitting: Family

## 2021-11-19 ENCOUNTER — Encounter: Payer: Self-pay | Admitting: Family

## 2021-11-19 VITALS — BP 132/62 | HR 78 | Temp 97.8°F | Resp 16

## 2021-11-19 DIAGNOSIS — J3089 Other allergic rhinitis: Secondary | ICD-10-CM | POA: Diagnosis not present

## 2021-11-19 DIAGNOSIS — J4540 Moderate persistent asthma, uncomplicated: Secondary | ICD-10-CM

## 2021-11-19 DIAGNOSIS — J329 Chronic sinusitis, unspecified: Secondary | ICD-10-CM

## 2021-11-19 DIAGNOSIS — H1013 Acute atopic conjunctivitis, bilateral: Secondary | ICD-10-CM | POA: Diagnosis not present

## 2021-11-19 MED ORDER — BUDESONIDE 0.5 MG/2ML IN SUSP: 0.5000 mg | Freq: Two times a day (BID) | RESPIRATORY_TRACT | 2 refills | Status: DC

## 2021-11-19 NOTE — Progress Notes (Signed)
? ?[place] [id number] ?Dept: [phone] ? ?FOLLOW UP NOTE ? ?Patient ID: Monica Wallace, female    DOB: [birth year]  Age: 71 y.o. MRN: [id number] ?Date of Office Visit: 11/19/2021 ? ?Assessment  ?Chief Complaint: Asthma ? ?HPI ?Monica Wallace is a 71 year old female who presents today for follow-up of mild persistent asthma and allergic rhinoconjunctivitis.  She was last seen on January 26, 2021 by myself.  Since her last office visit she was diagnosed with strep throat and also had a sleep study showing moderate sleep apnea.  She reports that in the past she had a sleep study in 2016 that showed mild obstructive sleep apnea.  While she was wearing the machine she had a lot of sinus infections and did not sleep well.  She also mentions that the CPAP was not ordered correctly.  She told the nurse that she would think about wearing his CPAP.  She reports that she knows the ramifications of not wearing a CPAP.  She is going to discuss other alternatives.  Also, in October 2022 while getting ready her legs felt weak and her head did not feel right.  She reports that she has had multiple MRIs of her head, neck, and lumbar region.  She reports that she had an EMG showing cervical radiculopathy and C5-C6.She has tried doing physical therapy.  Her neurologist is going to refer her to a neuromuscular specialist at [facility].  During this time they have also changed her blood pressure pill and they have also made changes with her Synthroid.  She currently does not know which dose of Synthroid to take because she has been prescribed 3 different doses.  She has a message out to her primary care physician. ? ?Moderate persistent asthma is reported as not well controlled with montelukast 10 mg once a day and albuterol as needed.  She has not been taking Pulmicort 180 mcg because when she went to pick up the refill at the first of the year and it was almost $400.  She cannot afford to pay this.  She has a phone call  conference with the pharmacist that her primary care physician connected her with to talk about cheaper options.  She reports a cough that is coming more from her throat.  The cough is productive with clear sputum.  She wonders if it is irritation that is causing the cough.  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  She reports that smoking bothers her asthma.  Also dogs and cat bother her asthma and she was recently visiting a family member that has one.  Last month diagnosed with strep throat and was given "penicillin" and steroids.  She reports that she did not have any problems with the penicillin she was given.  A few days later she had an asthma exacerbation and went to the emergency room on November 04, 2021 and she reports that she was given steroids IV, Z-Pak, and a prednisone taper.  Her COVID-19 and influenza testing were negative.  She had a chest x-ray "no active cardiopulmonary disease".  Last week she noticed bumps in her mouth and was prescribed Diflucan by her primary care physician.  She took 1 dose last Friday and is going to take another pill today.  She wonders if she has yeast in her throat and this is causing the sensation.  The cough did not completely go away while she was on prednisone. ? ?  Allergic rhinoconjunctivitis is reported as not well controlled with azelastine nasal spray twice a day, fluticasone nasal spray once a day, saline rinses as needed, Mucinex as needed, Claritin 10 mg once a day, and Pataday eyedrops as needed.  She reports clear rhinorrhea and nasal congestion.  She is not certain if she has postnasal drip and reports that it is hard to tell with the sensation she is having in her throat.  She has not had any sinus infections since we last saw her.  She did not get the repeat streptococcal pneumococcal titers due to not wanting to pay out-of-pocket for the lab work.  She mentions that she saw Dr. Benjamine Mola a few months ago and is now seeing him once  a year. ? ? ?Drug Allergies:  ?Allergies  ?Allergen Reactions  ? Levaquin [Levofloxacin] Shortness Of Breath  ? Penicillins Other (See Comments)  ?  Positive on allergy testing 'years ago.' Has never had penicillin. Has tolerated amoxicillin.  ? Clindamycin/Lincomycin Other (See Comments)  ?  Pruritus ?No rash  ? Codeine Itching  ? Gabapentin Itching  ? ? ?Review of Systems: ?Review of Systems  ?Constitutional:  Negative for chills and fever.  ?HENT:    ?     Reports clear rhinorrhea and nasal congestion.  She reports it is hard to tell if she has a little bit of postnasal drip for the symptoms she is having in her throat.  ?Eyes:   ?     Reports itchy watery eyes for which she has over-the-counter Pataday eyedrops  ?Respiratory:  Positive for cough. Negative for shortness of breath and wheezing.   ?     Reports productive cough with clear sputum and nocturnal awakenings due to cough.  Denies wheezing, tightness in chest, and shortness of breath  ?Cardiovascular:  Negative for chest pain and palpitations.  ?Gastrointestinal:   ?     Reports reflux occasionally.  Currently on omeprazole 40 mg once a day  ?Genitourinary:  Positive for frequency.  ?     Reports increased frequency of urination.  Has current diagnosis of being diabetic, but is not on any medications for this.  Since being on steroids her blood sugar has been around 110-120.  Prior to that her blood sugar was around 92  ?Skin:  Positive for itching. Negative for rash.  ?     Reports an itchy area on her back that could be due to her thyroid.  She does not see a rash  ?Neurological:  Negative for headaches.  ?Endo/Heme/Allergies:  Positive for environmental allergies.  ? ? ?Physical Exam: ?BP 132/62   Pulse 78   Temp 97.8 ?F (36.6 ?C) (Temporal)   Resp 16   SpO2 97%   ? ?Physical Exam ?Constitutional:   ?   Appearance: Normal appearance.  ?HENT:  ?   Head: Normocephalic and atraumatic.  ?   Comments: Pharynx normal, eyes normal, ears normal, nose:  Bilateral lower turbinates mildly edematous and erythematous with no drainage noted ?   Right Ear: Tympanic membrane, ear canal and external ear normal.  ?   Left Ear: Tympanic membrane, ear canal and external ear normal.  ?   Mouth/Throat:  ?   Mouth: Mucous membranes are moist.  ?   Pharynx: Oropharynx is clear.  ?   Comments: No thrush noted ?Eyes:  ?   Conjunctiva/sclera: Conjunctivae normal.  ?Cardiovascular:  ?   Rate and Rhythm: Normal rate and regular rhythm.  ?Pulmonary:  ?  Effort: Pulmonary effort is normal.  ?   Breath sounds: Normal breath sounds.  ?   Comments: Lungs clear to auscultation ?Musculoskeletal:  ?   Cervical back: Neck supple.  ?Skin: ?   General: Skin is warm.  ?Neurological:  ?   Mental Status: She is alert and oriented to person, place, and time.  ?Psychiatric:     ?   Mood and Affect: Mood normal.     ?   Behavior: Behavior normal.     ?   Thought Content: Thought content normal.     ?   Judgment: Judgment normal.  ? ? ?Diagnostics: ?FVC 1.37 L (62%), FEV1 1.13 L (66%).  Predicted FVC 2.20 L, predicted FEV1 1.72 L.  Spirometry indicates possible moderate restriction. ? ?Assessment and Plan: ?1. Moderate persistent asthma, unspecified whether complicated   ?2. Allergic rhinitis   ?3. Allergic conjunctivitis of both eyes   ?4. Recurrent sinus infections   ? ? ?Meds ordered this encounter  ?Medications  ? budesonide (PULMICORT) 0.5 MG/2ML nebulizer solution  ?  Sig: Take 2 mLs (0.5 mg total) by nebulization 2 (two) times daily.  ?  Dispense:  120 mL  ?  Refill:  2  ? ? ?Patient Instructions  ?Mild persistent asthma (stopped Pulmicort due to cost in January 2023- has an appointment with pharmacist next week to discuss cost and options) ?Start Pulmicort 0.5 mg twice a day via nebulizer to help prevent cough and wheeze ?Continue montelukast (Singulair) 10 mg once a day to help prevent cough and wheeze ?May use albuterol 2 puffs every 4-6 hours as needed for cough, wheeze, tightness in chest,  shortness of breath.  Also may use albuterol 2 puffs 5 to 15 minutes prior to exercise ?We are going to refer you to pulmonology due to restrictive lung disease ?Continue Diflucan as directed by your primary c

## 2021-11-22 ENCOUNTER — Telehealth: Payer: Self-pay | Admitting: Family Medicine

## 2021-11-22 NOTE — Telephone Encounter (Signed)
Patient brought in forms for blyth to fill out ?Would like them to be faxed to [phone]([id number] work) ? ?Placed in bin up front  ?

## 2021-11-23 ENCOUNTER — Ambulatory Visit (INDEPENDENT_AMBULATORY_CARE_PROVIDER_SITE_OTHER): Payer: Medicare Other | Admitting: Pharmacist

## 2021-11-23 ENCOUNTER — Other Ambulatory Visit: Payer: Self-pay

## 2021-11-23 ENCOUNTER — Other Ambulatory Visit: Payer: Self-pay | Admitting: Family Medicine

## 2021-11-23 DIAGNOSIS — I10 Essential (primary) hypertension: Secondary | ICD-10-CM

## 2021-11-23 DIAGNOSIS — E782 Mixed hyperlipidemia: Secondary | ICD-10-CM

## 2021-11-23 DIAGNOSIS — E890 Postprocedural hypothyroidism: Secondary | ICD-10-CM

## 2021-11-23 DIAGNOSIS — J4540 Moderate persistent asthma, uncomplicated: Secondary | ICD-10-CM

## 2021-11-23 MED ORDER — PULMICORT FLEXHALER 180 MCG/ACT IN AEPB: 2.0000 | INHALATION_SPRAY | Freq: Every day | RESPIRATORY_TRACT | 5 refills | Status: DC

## 2021-11-23 MED ORDER — LEVOTHYROXINE SODIUM 137 MCG PO TABS: 137.0000 ug | ORAL_TABLET | Freq: Every day | ORAL | 1 refills | Status: DC

## 2021-11-23 NOTE — Telephone Encounter (Signed)
Forms collected. ?

## 2021-11-23 NOTE — Therapy (Signed)
?OUTPATIENT PHYSICAL THERAPY  EVALUATION ? ? ?Patient Name: Monica Wallace ?MRN: [id number] ?DOB:June 12, 1951, 71 y.o., female ?Today's Date: 11/24/2021 ? ? PT End of Session - 11/24/21 1750   ? ? Visit Number 1   ? Number of Visits 12   ? Date for PT Re-Evaluation 01/05/22   ? Authorization Type Medicare & AARP   ? PT Start Time 3329   ? PT Stop Time 5188   ? PT Time Calculation (min) 40 min   ? Activity Tolerance Patient tolerated treatment well   ? Behavior During Therapy [facility] for tasks assessed/performed   ? ?  ?  ? ?  ? ? ?Past Medical History:  ?Diagnosis Date  ? Arthritis   ? Asthma   ? Baker's cyst of knee, right 02/16/2017  ? Cancer [facility]) 2002  ? thyroid (2002)  ? Cystic breast   ? Diabetes ([place])   ? GERD (gastroesophageal reflux disease)   ? Hepatic cyst 02/02/2015  ? Hyperlipidemia   ? Hypertension   ? Insomnia 07/23/2016  ? Menopause   ? Migraine   ? Palpitations   ? Preventative health care 01/18/2016  ? Sleep apnea 02/02/2015  ? Thyroid disease   ? ?Past Surgical History:  ?Procedure Laterality Date  ? ABDOMINAL HYSTERECTOMY    ? took right ovary and uterus  ? APPENDECTOMY    ? CARPAL TUNNEL RELEASE Right   ? CESAREAN SECTION    ? CHOLECYSTECTOMY    ? EYE SURGERY Bilateral 2017  ? cataracts  ? KNEE ARTHROPLASTY    ? SHOULDER ARTHROSCOPY W/ ROTATOR CUFF REPAIR Left   ? THYROIDECTOMY  2002  ? ?Patient Active Problem List  ? Diagnosis Date Noted  ? Unsteady gait 11/14/2021  ? Lumbar radiculopathy 09/09/2021  ? Cervical cancer screening 03/07/2021  ? Allergic conjunctivitis 04/08/2020  ? Neck pain 02/06/2020  ? Hip pain, right 09/30/2019  ? COVID-19 09/30/2019  ? Educated about COVID-19 virus infection 12/05/2018  ? Baker's cyst of knee, right 02/16/2017  ? Insomnia 07/23/2016  ? Hypokalemia 07/23/2016  ? Controlled type 2 diabetes mellitus without complication, without long-term current use of insulin ([place]) 01/24/2016  ? Preventative health care 01/18/2016  ? Left-sided low back pain with left-sided sciatica  10/07/2015  ? Obesity (BMI 30-39.9) 06/05/2015  ? OSA (obstructive sleep apnea) 02/02/2015  ? Palpitations 09/19/2014  ? Plantar fasciitis, bilateral 11/19/2013  ? Pronation deformity of ankle, acquired 11/19/2013  ? DJD (degenerative joint disease) 08/26/2013  ? Fibrocystic breast 08/26/2013  ? HTN (hypertension) 08/26/2013  ? GERD (gastroesophageal reflux disease) 08/26/2013  ? S/P hysterectomy with oophorectomy 08/26/2013  ? Hypothyroidism 08/26/2013  ? Chronic hoarseness 08/26/2013  ? Colon polyp 08/26/2013  ? Hiatal hernia 08/26/2013  ? Irritable bowel syndrome 08/26/2013  ? Moderate persistent asthma 08/26/2013  ? Allergic rhinoconjunctivitis 08/26/2013  ? Hyperlipidemia 08/22/2013  ? Migraine 08/22/2013  ? Thyroid cancer ([place]) 08/22/2013  ? ? ?PCP: [name] MD ? ?REFERRING PROVIDER: Alric Ran, MD ? ?REFERRING DIAG: R26.9 (ICD-10-CM) - Gait abnormality ?W19.XXXS (ICD-10-CM) - Fall, sequela ? ?THERAPY DIAG:  ?Unsteadiness on feet ? ?Muscle weakness (generalized) ? ?Difficulty in walking, not elsewhere classified ? ?ONSET DATE: ~1 month ? ?SUBJECTIVE:  ? ?SUBJECTIVE STATEMENT: ?Monica Wallace reports she has had multiple episodes of PT here previously.  She has cerrvical radiculopathy confirmed by EMG testing, but previous therapy for neck has helped with pain.  Her primary concern today is weakness in her R leg.  She has  also had a fall.  She reports that she had strep last month and severe asthma exacerbation, which set her back.   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   ? ?PERTINENT HISTORY: ?asthma, hypertension, thyroid disease and diabetes mellitus type 2, cervical radiculopathy, arthritis, history LBP ? ?PAIN:  ?Are you having pain? No ? ?PRECAUTIONS: Fall ? ?WEIGHT BEARING RESTRICTIONS No ? ?FALLS:  ?Has patient fallen in last 6 months? Yes. Number of falls  1 - fell when bending over to dry toes after shower ? ?LIVING ENVIRONMENT: ?Lives with: lives with their family ?Lives in: House/apartment ?Stairs: Yes: Internal: 8 + 4 steps; on right going up ?Has following equipment at home: None ? 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   ? ?PLOF: Independent ? ?PATIENT GOALS: get stronger so able to continue working, be able to return to school with client and keep up with him.   ? ? ?OBJECTIVE:  ? ?DIAGNOSTIC FINDINGS: MRI cervical spine was unchanged from prior and it showed moderate right-sided C3 thru C7 moderate foraminal stenosis, there were no spinal canal stenosis. ?MRI Lumbar spine 09/19/21 ?1. Moderate lower lumbar facet arthrosis, which may serve as a source of local low back pain. ?2. No spinal canal or neural foraminal stenosis. ?MRI Cervical spine 07/31/2021 ?1. Unchanged examination of the cervical spine with moderate right C3-4 and mild right C4-5 and left C6-7 neural foraminal stenosis. ?2. No spinal canal stenosis ? ? ?PATIENT SURVEYS:  ?ABC scale (to be given next session) ? ?COGNITION: ? Overall cognitive status: Within functional limits for tasks assessed   ?  ?SENSATION: ?Reports numbness bil toes R>L, decreased sensation R upper thigh 25% ?   ? ?POSTURE:  ?Forward head posture ? ?PALPATION: ?Tenderness L5 ? ? ?LE MMT: ? ?MMT Right ?11/24/2021 Left ?11/24/2021  ?Hip flexion 4 5  ?Hip extension 4 4+  ?Hip abduction 5 5  ?Hip adduction 5 5  ?Hip internal rotation    ?Hip external rotation    ?Knee flexion 5 5  ?Knee extension 5 5  ?Ankle dorsiflexion 5 5  ?Ankle plantarflexion    ?Ankle inversion    ?Ankle eversion    ? (Blank rows = not tested) ? ? ?FUNCTIONAL TESTS:  ?5 times sit to stand: 18.6 sec hands on thighs ?Tandem L foot forward 30 sec, R foot forward 10 sec  ?mCTSIB condition 1 30 sec, condition 2 30 sec, condition 3 30 sec, condition 4 30 sec but large sway.   ?DGI: 16/24 high risk for falls.  ? ?GAIT: ?Distance walked: 150  ?Assistive device utilized: None ?Level of assistance: Complete Independence ?Comments:  ModI on stairs with decreased eccentric control R quad descending.  ?Gait Speed: 0.5 m/s ? ? ? ?TODAY'S TREATMENT: ?Self Care - see patient education.   ? ? ?PATIENT EDUCATION:  ?Education details: Findings, POC, initial HEP  ?Person educated: Patient ?Education method: Explanation, Demonstration, Verbal cues, and Handouts ?Education comprehension: verbalized understanding ? ? ?HOME EXERCISE PROGRAM: ?Access Code: [email or web address] ?URL: [email or web address] ?Date: 11/24/2021 ?Prepared by: Glenetta Hew ? ?Exercises ?- Standing Tandem Balance with Counter Support  - 1 x daily - 7 x weekly - 1 sets - 3 reps - 30 sec  hold ?- Single Leg Stance with Support  - 1  x daily - 7 x weekly - 1 sets - 3 reps - 1 min  hold ?- Squat with Chair Touch  - 1 x daily - 7 x weekly - 2 sets - 10 reps ? ?ASSESSMENT: ? ?CLINICAL IMPRESSION: ?Patient is a 71 y.o. female who was seen today for physical therapy evaluation and treatment for gait abnormality and falls.  She demonstrates R hip weakness, decreased eccentric quad control, increased risk of falls as demonstrated by 5xSTS >14 sec, increased sway condition 4 of mCTSIB, and DGI <19/24.  She would benefit from skilled physical therapy to improve strength and balance in order to be able to return to work and ambulate in community safely.  ? ? ?OBJECTIVE IMPAIRMENTS decreased activity tolerance, decreased balance, decreased endurance, decreased mobility, difficulty walking, decreased strength, impaired sensation, improper body mechanics, and postural dysfunction.  ? ?ACTIVITY LIMITATIONS community activity, occupation, and shopping.  ? ?PERSONAL FACTORS Age, Fitness, and 3+ comorbidities: asthma, hypertension, thyroid disease and diabetes mellitus type 2, cervical radiculopathy, arthritis, history LBP  are also affecting  patient's functional outcome.  ? ? ?REHAB POTENTIAL: Excellent ? ?CLINICAL DECISION MAKING: Evolving/moderate complexity ? ?EVALUATION COMPLEXITY: Moderate ? ? ?GOALS: ?Goals reviewed with patient? Yes ? ?

## 2021-11-23 NOTE — Chronic Care Management (AMB) (Signed)
? ? ?Chronic Care Management ?Pharmacy Note ? ?11/23/2021 ?Name:  Monica Wallace MRN:  [id number] DOB:  10/31/1950 ? ?Summary: ?Asthma: Patient reports that she has not been able to afford Pulmicort Inhaler and has not taken since January. Cost was $400. Last week was changed to budesonide ampules for nebulizer but per patient [facility] has been unable to fill due to insurance issues. Reviewed patient's formulary. She has [facility] part D [phone]) which has a $350 deductible. After deductible Pulmicort inhaler is tier 3 and $40 per 30 days and $120 per 90 days. Budesonide ampules are tier 4 and cost is 45% of cost which is about $65 per 30 days. Called [facility] and patient is $13.75 aware from meeting deducible for 2023. First fill of Pulmicort inhaler will be $53.75. Patient is agreeable to this cost. After this will be $40 / month. Suggested to patient that if she is able to consider filling for 90 days in December for $120 and this will give her some time to meet her deductible in 2024 before she will need Pulmicort Inhaler again (that is as long as she does not reach coverage gap in 2023).  ?Hyperlipidemia: Last LDL was not at goal - was 114 in August 2022. Patient has been taking rosuvastatin 62m twice a week since August 2022. Will be due to have labs rechecked with next labs. If LDL still > 100 consider increasing dose of rosuvastatin. Patient has not been taking Fish Oil the last few months. Last triglycerides were slightly elevated at 164. Discussed triglyceride goals and recommended she restart Fish Oil 1 gram daily to help lower triglycerides.  ?Hypertension: [place] for blood pressure of 158/73. However blood pressure has been checked at subsequent office visits and two were at goal of < 140/90. Patient is not currently checking blood pressure at home. Reviewed blood pressure goal with patient. Reviewed adherence with blood pressure  medications - triamterene-hydrochlorothiazide patient has filled on time recently.  ?Hypothyroidism with history of thyroid cancer: Patient states that she has received notification about changes in levothyroxine for 138m and 17565mand she wanted to verify which dose she was suppose to take. Reviewed last TFTs; TSH was low indicating over suppression of thyroid function and dose was lowered form 150m27maily to 137mc30mily by PCP. Verified with [facility] that there are not active prescriptions for levothyroxine 175mcg92mtient also thought she has an upcoming appointment with endo. Reviewed appointment and it appears her appointment for 12/13/2021 was cancelled because provider will not be available - endo office was not able to reach her. She will reach out to endo office to reschedule.  ? ?Subjective: ?Monica Wallace 70 y.o8year old female who is a primary patient of Blyth,[name] The CCM team was consulted for assistance with disease management and care coordination needs.   ? ?Engaged with patient by telephone for initial visit in response to provider referral for pharmacy case management and/or care coordination services.  ? ?Consent to Services:  ?The patient was given the following information about Chronic Care Management services today, agreed to services, and gave verbal consent: 1. CCM service includes personalized support from designated clinical staff supervised by the primary care provider, including individualized plan of care and coordination with other care providers 2. 24/7 contact phone numbers for assistance for urgent and routine care needs. 3. Service will only be billed when office clinical  staff spend 20 minutes or more in a month to coordinate care. 4. Only one practitioner may furnish and bill the service in a calendar month. 5.The patient may stop CCM services at any time (effective at the end of the month) by phone call to the office staff. 6. The patient will be  responsible for cost sharing (co-pay) of up to 20% of the service fee (after annual deductible is met). Patient agreed to services and consent obtained. ? ?Patient Care Team: ?[name], MD as PCP - General (Family Medicine) ?Juanita Craver, MD as Consulting Physician (Gastroenterology) ?[place], MD as Consulting Physician (Pulmonary Disease) ?Darleen Crocker, MD as Consulting Physician (Ophthalmology) ?Leta Baptist, MD as Consulting Physician (Otolaryngology) ?Luretha Rued, RN as Case Manager ?Cherre Robins, RPH-CPP (Pharmacist) ?Shamleffer, Melanie Crazier, MD as Attending Physician (Endocrinology) ? ?Recent office visits: ?11/09/21-Fam Med (Dr Charlett Blake) Asthma follow up. Labs ordered. Start Diflucan 150 mg for yeast infection. Follow up in 3 months. ?09/07/21-Int Med Inda Castle, NP) Seen for general follow up. MRI lumbar spine ordered. Labs ordered. Follow up in 3 months. ?07/20/21-Int Med (Dr Larose Kells) General follow up. Start KCl 10 mEq 1 tablet daily for 10 days. Keep the appointment to see neurologist.  ?[facility] [place]) Seen for f/U mild persistent asthma and allergic rhinoconjuctivitis. Changed pulmicort to nebs due to cost of inhaler. Referred to pulmonology due to restrictive lung disease.  ? ?Recent consult visits: ?10/21/21-Amadou Camara, MD (Neurology) General follow up visit. AMB referral orthopedics. Ambulatory referral physical therapy. Follow up in 6 months. ?10/06/21-Brian S. Stanford Breed, MD (Cardiology) Follow up presyncope. Follow up as needed. ?09/16/21-Robert Noemi Chapel (Orthopedic Surgery)Notes not available.  ?08/16/21-Adewale A. Burnett Harry, MD (Pulmonology) Patient being evaluated for obstructive sleep apnea. Follow up in 4 months. ?07/21/21-Amadou Camara, MD (Neurology) General follow up visit. MRI ordered of cervical spine. Follow up in 3 months. ?06/29/21-Amadou April Manson, MD (Neurology) Initial visit. Labs ordered. Return if symptoms worsen or fail to  improve. ?06/08/21-Ibtehal Nena Jordan (Endocrinology) Follow up visit. Stop fluconazole 150 mg. Follow up in 6 months. ?06/01/21-Elyshia Rosalyn Gess (Internal medicine) Notes not available.  ?05/26/21-Brian S. Stanford Breed, MD (Cardiology) Initial visit. Stop potasium and stop Triam/HCT. Follow up in 4 months. ?  ?Hospital visits:  ?11/04/2021 - ED Visit for Cough at [facility]  ?New?Medications Started at [facility] Discharge:? none  ?Medication Changes at Hospital Discharge: none  ?Medications Discontinued at Hospital Discharge: none  ? ?07/16/2022 - ED Visit for numbness at [facility].  ?New?Medications Started at [facility] Discharge:? none  ?Medication Changes at Hospital Discharge: none  ?Medications Discontinued at Hospital Discharge: none  ?  ?06/18/21 ED visit for weakness at [place]?Medications Started at [facility] Discharge:? none  ?Medication Changes at Hospital Discharge: none  ?Medications Discontinued at Hospital Discharge: none   ? ? ?Objective: ? ?Lab Results  ?Component Value Date  ? CREATININE 0.87 11/17/2021  ? CREATININE 0.87 11/09/2021  ? CREATININE 0.93 11/04/2021  ? ? ?Lab Results  ?Component Value Date  ? HGBA1C 6.3 09/07/2021  ? ?Last diabetic Eye exam:  ?Lab Results  ?Component Value Date/Time  ? HMDIABEYEEXA No Retinopathy 01/28/2021 01:13 PM  ?  ?Last diabetic Foot exam: No results found for: HMDIABFOOTEX  ? ?   ?Component Value Date/Time  ? CHOL 199 03/02/2021 1430  ? TRIG 164.0 (H) 03/02/2021 1430  ? HDL 52.10 03/02/2021 1430  ? CHOLHDL 4 03/02/2021 1430  ? VLDL 32.8 03/02/2021  1430  ? LDLCALC 114 (H) 03/02/2021 1430  ? LDLDIRECT 86.0 10/29/2020 1036  ? ? ? ?  Latest Ref Rng & Units 11/17/2021  ?  3:04 PM 11/09/2021  ? 10:35 AM 11/04/2021  ?  6:47 PM  ?Hepatic Function  ?Total Protein 6.0 - 8.3 g/dL 6.6   6.7   7.5    ?Albumin 3.5 - 5.2 g/dL 3.9   3.8   3.6    ?AST 0 - 37 U/L 22   27   45    ?ALT 0 - 35 U/L 27   37   40    ?Alk Phosphatase 39 - 117 U/L 70    74   82    ?Total Bilirubin 0.2 - 1.2 mg/dL 0.3   0.4   0.2    ? ? ?Lab Results  ?Component Value Date/Time  ? TSH 0.07 (L) 11/09/2021 10:35 AM  ? TSH 0.69 06/08/2021 04:29 PM  ? FREET4 1.07 11/20/2017 04:37

## 2021-11-24 ENCOUNTER — Telehealth: Payer: Self-pay

## 2021-11-24 ENCOUNTER — Ambulatory Visit: Payer: Medicare Other | Attending: Neurology | Admitting: Physical Therapy

## 2021-11-24 ENCOUNTER — Encounter: Payer: Self-pay | Admitting: Physical Therapy

## 2021-11-24 DIAGNOSIS — M6281 Muscle weakness (generalized): Secondary | ICD-10-CM | POA: Diagnosis not present

## 2021-11-24 DIAGNOSIS — R269 Unspecified abnormalities of gait and mobility: Secondary | ICD-10-CM | POA: Diagnosis not present

## 2021-11-24 DIAGNOSIS — R2681 Unsteadiness on feet: Secondary | ICD-10-CM | POA: Diagnosis not present

## 2021-11-24 DIAGNOSIS — R262 Difficulty in walking, not elsewhere classified: Secondary | ICD-10-CM | POA: Diagnosis present

## 2021-11-24 NOTE — Patient Instructions (Signed)
Ms. Vukelich ?It was a pleasure speaking with you today ?Below is a summary of your health goals and care plan ? ?If you have any questions or concerns, please feel free to contact me either at the phone number below or with a MyChart message.  ? ?Keep up the good work! ? ?Cherre Robins, PharmD ?Clinical Pharmacist ?[facility] ?[place] ?[phone] (direct line)  ?[phone] (main office number) ? ?Chronic Care Management Care Plan:  ? ?Current Barriers:  ?Unable to afford Pulmicort / High medication cost due to insurance deductible ?Unable to achieve control of hyperlipidemia, mixed  ?Unable to maintain control of HTN ? ?Pharmacist Clinical Goal(s):  ?Over the next 90 days, patient will verbalize ability to afford treatment regimen ?achieve control of hyperlipidemia as evidenced by LDL < 100 and Tg <150 ?maintain control of hypertension as evidenced by blood pressure < 140/90  through collaboration with PharmD and provider.  ? ?Interventions: ?1:1 collaboration with [name], MD regarding development and update of comprehensive plan of care as evidenced by provider attestation and co-signature ?Inter-disciplinary care team collaboration (see longitudinal plan of care) ?Comprehensive medication review performed; medication list updated in electronic medical record ? ?Hypertension: ?Controlled; blood pressure goal < 140/90 ?Current treatment: ?Triamterene-hydrochlorothiazide 37.5/'25mg'$  - take 1 tablet daily ?Interventions:  ?Reviewed blood pressure goal with patient.  ?Reviewed adherence with blood pressure medications - triamterene-hydrochlorothiazide patient has been filled on time recently.  ? ?Hyperlipidemia: ?Not at goal - LDL goal < 100 and Triglyceride goal < 150 ?Current treatment: ?Rosuvastatin '5mg'$  twice a week ?Fish Oil '1000mg'$  daily ?Last LDL was not at goal - was 114 in August 2022. Patient has been taking rosuvastatin '5mg'$  twice a week since August 2022. Will be due to have  labs rechecked with next labs.   ?Interventions:  ?Recheck lipids with next labs. If LDL still > 100 consider increasing dose of rosuvastatin. ?Discussed triglyceride goals and recommended restart Fish Oil 1 gram daily to help lower triglycerides ? ?Asthma: ?Uncontrolled ?goal: decrease number of days per month with asthma symptoms and prevent asthma exacerbations.  ?Current treatment: ?Budesonide / Pulmicort ampules - has not started yet ?Montelukast '10mg'$  daily at  bedtime ?Patient reports that she has not been able to afford Pulmicort Inhaler and has not taken since January. Cost was $400.  ?Last week was changed to budesonide ampules for nebulizer but per patient [facility] has been unable to fill due to insurance issues.  ?Interventions:  ?Reviewed patient's formulary. She has [facility] part D [phone]) which has a $350 deductible. After deductible Pulmicort inhaler is tier 3 and $40 per 30 days and $120 per 90 days. Budesonide ampules are tier 4 and cost is 45% of cost which is about $65 per 30 days. Called [facility] and patient is $13.75 aware from meeting deducible for 2023. First fill of Pulmicort inhaler will be $53.75. Patient is agreeable to this cost. After this will be $40 / month.  ?Suggested to patient that if she is able to consider filling for 90 days in December for $120 and this will give her some time to meet her deductible in 2024 before she will need Pulmicort Inhaler again (that is as long as she does not reach coverage gap in 2023) ?Education provided about inhaler use  ? ?Hypothyroidism with history of thyroid cancer:  ?Not at goal; Goal is TSH in therapeutic range ?Current therapy:  ?Levothyroxine 159mg daily (has not started yet)  ?Patient states  that she has received notification about changes in levothyroxine for 159mg and 1767m and she wanted to verify which dose she was suppose to take.  ?Interentions:  ?Reviewed last thyroid function tests; TSH was low  indicating over suppression of thyroid function and dose was lowered form 15028mdaily to 137m37maily by PCP.  ?Verified with [facility] that there are not active prescriptions for levothyroxine 175mc43m?Patient also thought she has an upcoming appointment with Endocrinologist. Reviewed appointment and it appears her appointment for 12/13/2021 was cancelled because provider will not be available and office was not able to reach patient. [facility], patient will reach out to office to reschedule. ? ?Patient Goals/Self-Care Activities ?Over the next 90 days, patient will: ?take medications as prescribed. You can contact Clinical [facility] Practition[facility] any [facility] questions or concerns with cost of medications. ?focus on medication adherence by filling prescriptions on time and using weekly pill reminders if needed,  ?collaborate with provider on medication access solutions ?Start levothyroxine 137mcg61mly. [facility] follow up appointment. ?Restart Fish Oil '1000mg'$  daily.  ?Restart Pulmicort inhaler - 2 inhalations daily.  ? ?Follow Up Plan: Telephone follow up appointment with care management team member scheduled for:  Has follow up with Nurse Case Manager within the next month. ? ?Patient verbalizes understanding of instructions and care plan provided today and agrees to view in [facility] MyChart status confirmed with patient.    ?

## 2021-11-24 NOTE — Telephone Encounter (Signed)
Sent pt message regarding FMLA forms. ?

## 2021-11-25 ENCOUNTER — Telehealth: Payer: Self-pay | Admitting: Pharmacist

## 2021-11-25 ENCOUNTER — Ambulatory Visit: Payer: Medicare Other

## 2021-11-25 DIAGNOSIS — E782 Mixed hyperlipidemia: Secondary | ICD-10-CM

## 2021-11-25 DIAGNOSIS — J4540 Moderate persistent asthma, uncomplicated: Secondary | ICD-10-CM

## 2021-11-25 DIAGNOSIS — I10 Essential (primary) hypertension: Secondary | ICD-10-CM

## 2021-11-25 NOTE — Telephone Encounter (Signed)
See message form Chronic Care Management nurse case manager below.  ?Programmer, multimedia and pharmacy tech states that Pulmicort inhaler has been filled and is at pharmacy to pick up. She was unable to provide reason that patient received too early text other than patient many re-requested prescription and was too early due to this one being filled Tuesday, May 9th.  ?Patient aware Rx is ready and will pick up today. She is to call if she has any issues.  ? ? ? ? ? ?

## 2021-11-25 NOTE — Chronic Care Management (AMB) (Signed)
?Chronic Care Management  ? ?CCM RN Visit Note ? ?11/25/2021 ?Name: Monica Wallace MRN: [id number] DOB: [birth year] ? ?Subjective: ?Monica Wallace is a 71 y.o. year old female who is a primary care patient of [name], MD. The care management team was consulted for assistance with disease management and care coordination needs.   ? ?Engaged with patient by telephone for follow up visit in response to provider referral for case management and/or care coordination services.  ? ?Consent to Services:  ?The patient was given information about Chronic Care Management services, agreed to services, and gave verbal consent prior to initiation of services.  Please see initial visit note for detailed documentation.  ? ?Patient agreed to services and verbal consent obtained.  ? ?Assessment: Review of patient past medical history, allergies, medications, health status, including review of consultants reports, laboratory and other test data, was performed as part of comprehensive evaluation and provision of chronic care management services.  ? ?SDOH (Social Determinants of Health) assessments and interventions performed:   ? ?CCM Care Plan ? ?Allergies  ?Allergen Reactions  ? Levaquin [Levofloxacin] Shortness Of Breath  ? Penicillins Other (See Comments)  ?  Positive on allergy testing 'years ago.' Has never had penicillin. Has tolerated amoxicillin.  ? Clindamycin/Lincomycin Other (See Comments)  ?  Pruritus ?No rash  ? Codeine Itching  ? Gabapentin Itching  ? ? ?Outpatient Encounter Medications as of 11/25/2021  ?Medication Sig Note  ? acetaminophen (TYLENOL) 325 MG tablet Take 650 mg by mouth every 6 (six) hours as needed.   ? albuterol (PROVENTIL) (2.5 MG/3ML) 0.083% nebulizer solution Take 3 mLs (2.5 mg total) by nebulization every 6 (six) hours as needed for wheezing or shortness of breath.   ? albuterol (VENTOLIN HFA) 108 (90 Base) MCG/ACT inhaler INHALE 2 PUFFS INTO THE LUNGS EVERY 6 HOURS AS NEEDED FOR WHEEZING OR  SHORTNESS OF BREATH   ? azelastine (ASTELIN) 0.1 % nasal spray Place 1 spray in each nostril twice a day as needed for runny nose/drainage down throat   ? Cholecalciferol ([employer] VIT D 5000 HIGH-POTENCY PO) Take 5,000 Units by mouth daily.   ? estradiol (ESTRACE VAGINAL) 0.1 MG/GM vaginal cream Place 1 Applicatorful vaginally at bedtime.   ? fluticasone (FLONASE) 50 MCG/ACT nasal spray SHAKE LIQUID AND USE 2 SPRAYS IN EACH NOSTRIL DAILY   ? hyoscyamine (LEVSIN SL) 0.125 MG SL tablet Place 1 tablet (0.125 mg total) under the tongue every 4 (four) hours as needed.   ? levothyroxine (SYNTHROID) 137 MCG tablet Take 1 tablet (137 mcg total) by mouth daily before breakfast. NAME BRAND ONLY 11/23/2021: Hasn't started new dose yet  ? loratadine (CLARITIN) 10 MG tablet Take 10 mg by mouth daily. 06/03/2021: Reports taking once a day.  ? montelukast (SINGULAIR) 10 MG tablet TAKE 1 TABLET(10 MG) BY MOUTH DAILY AS NEEDED (Patient taking differently: Take 10 mg by mouth daily. TAKE 1 TABLET(10 MG) BY MOUTH DAILY AS NEEDED) 06/03/2021: Reports takes every night  ? omeprazole (PRILOSEC) 40 MG capsule Take 1 capsule by mouth daily.   ? potassium chloride SA (KLOR-CON M) 20 MEQ tablet TAKE 2 TABLETS BY MOUTH DAILY FOR 3 DAYS. THEN TAKE 1 TABLET BY MOUTH DAILY.   ? rosuvastatin (CRESTOR) 5 MG tablet TAKE 1 TAKE ONE TABLET EVERY__ HOURS TAKE ONE TABLET EVERY__ HOURS TAB BY MOUTH 2 TIMES A WEEK(TUE AND SUN WEEKLY)   ? triamterene-hydrochlorothiazide (MAXZIDE-25) 37.5-25 MG tablet Take 1 tablet by mouth daily.   ?  Blood Glucose Monitoring Suppl ([place]) w/Device KIT Use to check blood glucose once a day.  DX code: E11.9   ? budesonide (PULMICORT FLEXHALER) 180 MCG/ACT inhaler Inhale 2 puffs into the lungs daily.   ? glucose blood (ONETOUCH VERIO) test strip TEST BLOOD GLUCOSE ONCE A DAY   ? Lancets (ONETOUCH DELICA PLUS [id number]) MISC TEST BLOOD SUGAR ONCE A DAY   ? Omega-3 1000 MG CAPS Take 1,000 mg by mouth daily.  (Patient not taking: Reported on 11/25/2021)   ? ?No facility-administered encounter medications on file as of 11/25/2021.  ? ? ?Patient Active Problem List  ? Diagnosis Date Noted  ? Unsteady gait 11/14/2021  ? Lumbar radiculopathy 09/09/2021  ? Cervical cancer screening 03/07/2021  ? Allergic conjunctivitis 04/08/2020  ? Neck pain 02/06/2020  ? Hip pain, right 09/30/2019  ? COVID-19 09/30/2019  ? Educated about COVID-19 virus infection 12/05/2018  ? Baker's cyst of knee, right 02/16/2017  ? Insomnia 07/23/2016  ? Hypokalemia 07/23/2016  ? Controlled type 2 diabetes mellitus without complication, without long-term current use of insulin ([place]) 01/24/2016  ? Preventative health care 01/18/2016  ? Left-sided low back pain with left-sided sciatica 10/07/2015  ? Obesity (BMI 30-39.9) 06/05/2015  ? OSA (obstructive sleep apnea) 02/02/2015  ? Palpitations 09/19/2014  ? Plantar fasciitis, bilateral 11/19/2013  ? Pronation deformity of ankle, acquired 11/19/2013  ? DJD (degenerative joint disease) 08/26/2013  ? Fibrocystic breast 08/26/2013  ? HTN (hypertension) 08/26/2013  ? GERD (gastroesophageal reflux disease) 08/26/2013  ? S/P hysterectomy with oophorectomy 08/26/2013  ? Hypothyroidism 08/26/2013  ? Chronic hoarseness 08/26/2013  ? Colon polyp 08/26/2013  ? Hiatal hernia 08/26/2013  ? Irritable bowel syndrome 08/26/2013  ? Moderate persistent asthma 08/26/2013  ? Allergic rhinoconjunctivitis 08/26/2013  ? Hyperlipidemia 08/22/2013  ? Migraine 08/22/2013  ? Thyroid cancer ([place]) 08/22/2013  ? ? ?Conditions to be addressed/monitored:HTN, HLD, Asthma, and leg weakness ? ?Care Plan : RN Care Manager Plan of Care  ?Updates made by Luretha Rued, RN since 11/25/2021 12:00 AM  ?  ? ?Problem: Chronic Disease Managment education and/or Care Coordination Needs(HTN,HLD)   ?Priority: High  ?  ? ?Long-Range Goal: Development of plan of care for Chronic Disease Managment (HTN/HLD)   ?Start Date: 06/03/2021  ?Expected End Date:  05/28/2022  ?Priority: High  ?Note:   ?Current Barriers:  ?11/25/21 Monica Wallace reports primary concern is the weakness to right leg/hip-has started outpatient rehab therapy. She also reports rehab is also working on balance with her. Recent asthma flare 11/04/21 treated in the ED. She denies any signs/symptoms of asthma flare, but states she is unable to get Pulmicort. She states she received a message from [facility] that it is too early to refill Pulmicort.  ?Knowledge Deficits related to plan of care for management of HTN, HLD, and Asthma  ? ?RNCM Clinical Goal(s):  ?Patient will verbalize understanding of plan for management of HTN, HLD, and Asthma as evidenced by voicing understanding, taking medications as prescribed, attending provider visits as scheduled, notifying provider with questions and concerns, self report and/or chart notation ?take all medications exactly as prescribed and will call provider for medication related questions as evidenced by self report or as noted in chart    through collaboration with RN Care manager, provider, and care team.  ? ?Interventions: ?1:1 collaboration with primary care provider regarding development and update of comprehensive plan of care as evidenced by provider attestation and co-signature ?Inter-disciplinary care team collaboration (see longitudinal plan of  care) ?Evaluation of current treatment plan related to  self management and patient's adherence to plan as established by provider ? ?Lower Extremity weakness interventions  (Status:  New goal.)  Long Term Goal. ?Discussed plans with patient for ongoing care management follow up and provided patient with direct contact information for care management team ?Advised patient to maintain fall prevention strategies ?Reviewed medications with patient and discussed importance of taking as prescribed ?Collaborated with clinical pharmacist regarding medications ?Reviewed scheduled/upcoming provider appointments including   ?Provided fall prevention education ? ?Asthma Interventions: (Status:New goal.) Long Term Goal ?Provided patient with basic written and verbal Asthma education on self care/management/and exacerbation prevention ?Harvin Hazel

## 2021-11-25 NOTE — Patient Instructions (Addendum)
Visit Information ? ?Thank you for taking time to visit with me today. Please don't hesitate to contact me if I can be of assistance to you before our next scheduled telephone appointment. ? ?Following are the goals we discussed today:  ?Patient Goals/Self-Care Activities: ?check blood pressure daily routinely and notify provider if outside recommended parameters ?keep all doctor appointments ?take medications as prescribed and contact clinical pharmacist if unable to obtain medications ?attend rehab therapy sessions and perform exercise per therapist recommendation ?eat more whole grains, fruits and vegetables, lean meats and healthy fats ?Review educational material on fall prevention, asthma  ? ?Our next appointment is by telephone on 12/28/21 at 2:00 pm ? ?Please call the care guide team at [phone] if you need to cancel or reschedule your appointment.  ? ?If you are experiencing a Mental Health or [place] or need someone to talk to, please call the Suicide and Crisis Lifeline: 988 ?call 1-800-273-TALK (toll free, 24 hour hotline)  ? ?Patient verbalizes understanding of instructions and care plan provided today and agrees to view in [place]. Active MyChart status confirmed with patient.   ? ?Thea Silversmith, RN, MSN, BSN, CCM ?Care Management Coordinator ?[facility] [place] [facility] ?[phone]  ? ? ?Asthma Action Plan, Adult ?An asthma action plan helps you understand how to manage your asthma and what to do when you have an asthma attack. The action plan is a color-coded plan that lists the symptoms that indicate whether or not your condition is under control and what actions to take. ?If you have symptoms in the green zone, you are doing well. ?If you have symptoms in the yellow zone, you are having problems. ?If you have symptoms in the red zone, you need medical care right away. ?Follow the plan that you and your health care provider develop. Review your plan with your health care provider  at each visit. ?What triggers your asthma? ?Knowing the things that can trigger an asthma attack or make your asthma symptoms worse is very important. Talk to your health care provider about your asthma triggers and how to avoid them. Record your known asthma triggers here: _______________ ?What is your personal best peak flow reading? ?If you use a peak flow meter, determine your personal best reading. Record it here: _______________ ?Green zone ?This zone means that your asthma is under control. You may not have any symptoms while you are in the green zone. This means that you: ?Have no coughing or wheezing, even while you are working or playing. ?Sleep through the night. ?Are breathing well. ?Have a peak flow reading that is above __________ (80% of your personal best or greater). ?If you are in the green zone, continue to manage your asthma as directed. ?Take these medicines every day: ?Controller medicine and dosage: _______________ ?Controller medicine and dosage: _______________ ?Controller medicine and dosage: _______________ ?Controller medicine and dosage: _______________ ?Before exercise, use this reliever or rescue medicine: _______________ ?Call your health care provider if you are using a reliever or rescue medicine more than 2-3 times a week. ?Yellow zone ?Symptoms in this zone mean that your condition may be getting worse. You may have symptoms that interfere with exercise, are noticeably worse after exposure to triggers, or are worse at the first sign of a cold (upper respiratory infection). These may include: ?Waking from sleep. ?Coughing, especially at night or first thing in the morning. ?Mild wheezing. ?Chest tightness. ?A peak flow reading that is __________ to __________ (50-79% of your personal best). ?  If you have any of these symptoms: ?Add the following medicine to the ones that you use daily: ?Reliever or rescue medicine and dosage: _______________ ?Additional medicine and dosage:  _______________ ?Call your health care provider if: ?You remain in the yellow zone for __________ hours. ?You are using a reliever or rescue medicine more than 2-3 times a week. ?Red zone ?Symptoms in this zone mean that you should get medical help right away. You will likely feel distressed and have symptoms at rest that restrict your activity. You are in the red zone if: ?You are breathing hard and quickly. ?Your nose opens wide, your ribs show, and your neck muscles become visible when you breathe in. ?Your lips, fingers, or toes are a bluish color. ?You have trouble speaking in full sentences. ?Your peak flow reading is less than __________ (less than 50% of your personal best). ?Your symptoms do not improve within 15-20 minutes after you use your reliever or rescue medicine (bronchodilator). ?If you have any of these symptoms: ?These symptoms represent a serious problem that is an emergency. Do not wait to see if the symptoms will go away. Get medical help right away. Call your local emergency services (911 in the [place] Do not drive yourself to the hospital. ?Use your reliever or rescue medicine. ?Start a nebulizer treatment or take 2-4 puffs from a metered-dose inhaler with a spacer. ?Repeat this action every 15-20 minutes until help arrives. ?Where to find more information ?You can find more information about asthma from: ?Centers for Disease Control and Prevention: http://www.wolf.info/ ?American Lung Association: www.lung.org ?This information is not intended to replace advice given to you by your health care provider. Make sure you discuss any questions you have with your health care provider. ?Document Revised: 08/27/2020 Document Reviewed: 08/27/2020 ?Elsevier Patient Education ? [place]. ? ?Fall Prevention in the Home, Adult ?Falls can cause injuries and affect people of all ages. There are many simple things that you can do to make your home safe and to help prevent falls. Ask for help when making  these changes, if needed. ?What actions can I take to prevent falls? ?General instructions ?Use good lighting in all rooms. Replace any light bulbs that burn out, turn on lights if it is dark, and use night-lights. ?Place frequently used items in easy-to-reach places. Lower the shelves around your home if necessary. ?Set up furniture so that there are clear paths around it. Avoid moving your furniture around. ?Remove throw rugs and other tripping hazards from the floor. ?Avoid walking on wet floors. ?Fix any uneven floor surfaces. ?Add color or contrast paint or tape to grab bars and handrails in your home. Place contrasting color strips on the first and last steps of staircases. ?When you use a stepladder, make sure that it is completely opened and that the sides and supports are firmly locked. Have someone hold the ladder while you are using it. Do not climb a closed stepladder. ?Know where your pets are when moving through your home. ?What can I do in the bathroom? ? ?  ? ?Keep the floor dry. Immediately clean up any water that is on the floor. ?Remove soap buildup in the tub or shower regularly. ?Use nonskid mats or decals on the floor of the tub or shower. ?Attach bath mats securely with double-sided, nonslip rug tape. ?If you need to sit down while you are in the shower, use a plastic, nonslip stool. ?Install grab bars by the toilet and in the tub and  shower. Do not use towel bars as grab bars. ?What can I do in the bedroom? ?Make sure that a bedside light is easy to reach. ?Do not use oversized bedding that reaches the floor. ?Have a firm chair that has side arms to use for getting dressed. ?What can I do in the kitchen? ?Clean up any spills right away. ?If you need to reach for something above you, use a sturdy step stool that has a grab bar. ?Keep electrical cables out of the way. ?Do not use floor polish or wax that makes floors slippery. If you must use wax, make sure that it is non-skid floor wax. ?What  can I do with my stairs? ?Do not leave any items on the stairs. ?Make sure that you have a light switch at the top and the bottom of the stairs. Have them installed if you do not have them. ?Make sure that ther

## 2021-11-29 ENCOUNTER — Encounter: Payer: Self-pay | Admitting: Physical Therapy

## 2021-11-29 ENCOUNTER — Ambulatory Visit: Payer: Medicare Other | Admitting: Physical Therapy

## 2021-11-29 ENCOUNTER — Telehealth: Payer: Self-pay

## 2021-11-29 DIAGNOSIS — R269 Unspecified abnormalities of gait and mobility: Secondary | ICD-10-CM | POA: Diagnosis not present

## 2021-11-29 DIAGNOSIS — R262 Difficulty in walking, not elsewhere classified: Secondary | ICD-10-CM

## 2021-11-29 DIAGNOSIS — R2681 Unsteadiness on feet: Secondary | ICD-10-CM | POA: Diagnosis not present

## 2021-11-29 DIAGNOSIS — M6281 Muscle weakness (generalized): Secondary | ICD-10-CM

## 2021-11-29 NOTE — Therapy (Signed)
?OUTPATIENT PHYSICAL THERAPY  Treatment Note ?Patient Name: Monica Wallace ?MRN: [id number] ?DOB:06-20-1951, 71 y.o., female ?Today's Date: 11/29/2021 ? ? PT End of Session - 11/29/21 1715   ? ? Visit Number 2   ? Number of Visits 12   ? Date for PT Re-Evaluation 01/05/22   ? Authorization Type Medicare & AARP   ? PT Start Time 6144   patient arrived late  ? PT Stop Time 3154   ? PT Time Calculation (min) 30 min   ? Activity Tolerance Patient tolerated treatment well   ? Behavior During Therapy [facility] for tasks assessed/performed   ? ?  ?  ? ?  ? ? ?Past Medical History:  ?Diagnosis Date  ? Arthritis   ? Asthma   ? Baker's cyst of knee, right 02/16/2017  ? Cancer [facility]) 2002  ? thyroid (2002)  ? Cystic breast   ? Diabetes ([place])   ? GERD (gastroesophageal reflux disease)   ? Hepatic cyst 02/02/2015  ? Hyperlipidemia   ? Hypertension   ? Insomnia 07/23/2016  ? Menopause   ? Migraine   ? Palpitations   ? Preventative health care 01/18/2016  ? Sleep apnea 02/02/2015  ? Thyroid disease   ? ?Past Surgical History:  ?Procedure Laterality Date  ? ABDOMINAL HYSTERECTOMY    ? took right ovary and uterus  ? APPENDECTOMY    ? CARPAL TUNNEL RELEASE Right   ? CESAREAN SECTION    ? CHOLECYSTECTOMY    ? EYE SURGERY Bilateral 2017  ? cataracts  ? KNEE ARTHROPLASTY    ? SHOULDER ARTHROSCOPY W/ ROTATOR CUFF REPAIR Left   ? THYROIDECTOMY  2002  ? ?Patient Active Problem List  ? Diagnosis Date Noted  ? Unsteady gait 11/14/2021  ? Lumbar radiculopathy 09/09/2021  ? Cervical cancer screening 03/07/2021  ? Allergic conjunctivitis 04/08/2020  ? Neck pain 02/06/2020  ? Hip pain, right 09/30/2019  ? COVID-19 09/30/2019  ? Educated about COVID-19 virus infection 12/05/2018  ? Baker's cyst of knee, right 02/16/2017  ? Insomnia 07/23/2016  ? Hypokalemia 07/23/2016  ? Controlled type 2 diabetes mellitus without complication, without long-term current use of insulin ([place]) 01/24/2016  ? Preventative health care 01/18/2016  ? Left-sided low back pain with  left-sided sciatica 10/07/2015  ? Obesity (BMI 30-39.9) 06/05/2015  ? OSA (obstructive sleep apnea) 02/02/2015  ? Palpitations 09/19/2014  ? Plantar fasciitis, bilateral 11/19/2013  ? Pronation deformity of ankle, acquired 11/19/2013  ? DJD (degenerative joint disease) 08/26/2013  ? Fibrocystic breast 08/26/2013  ? HTN (hypertension) 08/26/2013  ? GERD (gastroesophageal reflux disease) 08/26/2013  ? S/P hysterectomy with oophorectomy 08/26/2013  ? Hypothyroidism 08/26/2013  ? Chronic hoarseness 08/26/2013  ? Colon polyp 08/26/2013  ? Hiatal hernia 08/26/2013  ? Irritable bowel syndrome 08/26/2013  ? Moderate persistent asthma 08/26/2013  ? Allergic rhinoconjunctivitis 08/26/2013  ? Hyperlipidemia 08/22/2013  ? Migraine 08/22/2013  ? Thyroid cancer ([place]) 08/22/2013  ? ? ?PCP: [name] MD ? ?REFERRING PROVIDER: Alric Ran, MD ? ?REFERRING DIAG: R26.9 (ICD-10-CM) - Gait abnormality ?W19.XXXS (ICD-10-CM) - Fall, sequela ? ?THERAPY DIAG:  ?Unsteadiness on feet ? ?Muscle weakness (generalized) ? ?Difficulty in walking, not elsewhere classified ? ?ONSET DATE: ~1 month ? ?SUBJECTIVE:  ? ?SUBJECTIVE STATEMENT: ?Pt. Denies pain or falls.  "Crazy day." ? ?PERTINENT HISTORY: ?asthma, hypertension, thyroid disease and diabetes mellitus type 2, cervical radiculopathy, arthritis, history LBP ? ?PAIN:  ?Are you having pain? No ? ?PRECAUTIONS: Fall ? 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   ? ? ?OBJECTIVE:  ? ?DIAGNOSTIC FINDINGS: MRI cervical spine was unchanged from prior and it showed moderate right-sided C3 thru C7 moderate foraminal stenosis, there were no spinal canal stenosis. ?MRI Lumbar spine 09/19/21 ?1. Moderate lower lumbar facet arthrosis, which may serve as a source of local low back pain. ?2. No spinal canal or neural foraminal stenosis. ?MRI Cervical spine 07/31/2021 ?1. Unchanged examination of the cervical spine with moderate  right C3-4 and mild right C4-5 and left C6-7 neural foraminal stenosis. ?2. No spinal canal stenosis ? ? ?PATIENT SURVEYS:  ?ABC scale 67% (11/29/2021) ? ? ?POSTURE:  ?Forward head posture ? ?LE MMT: ? ?MMT Right ?11/24/2021 Left ?11/24/2021  ?Hip flexion 4 5  ?Hip extension 4 4+  ?Hip abduction 5 5  ?Hip adduction 5 5  ?Knee flexion 5 5  ?Knee extension 5 5  ?Ankle dorsiflexion 5 5  ? (Blank rows = not tested) ? ? ?FUNCTIONAL TESTS:  ?5 times sit to stand: 18.6 sec hands on thighs ?Tandem L foot forward 30 sec, R foot forward 10 sec  ?mCTSIB condition 1 30 sec, condition 2 30 sec, condition 3 30 sec, condition 4 30 sec but large sway.  ?DGI: 16/24 high risk for falls.  ? ?GAIT: ?Distance walked: 150  ?Assistive device utilized: None ?Level of assistance: Complete Independence ?Comments:  ModI on stairs with decreased eccentric control R quad descending.  ?Gait Speed: 0.5 m/s ? ? ?TODAY'S TREATMENT: ?11/29/21 Therapeutic Exercise: to improve strength and mobility.  Verbal and tactile cues throughout for technique. ?Nustep L5 x 6 min for warm-up and ABCscale ?At counter with bil UE support for safety: ?Heel/toe raises x 20 ?Hip extension 2 x 10 bil  ?Hip abduction 2 x 10 bil  ?Hip flexion 2 x 10 bil  ?Squats 2 x 10 ?  Neuromuscular Reeducation: to improve balance and stability. SBA for safety throughout.  ?  In corner for safety on Airex  ?Eyes open feet together x 30 sec  ?Eyes open feet together with head nods x 10 ?Eyes open feet together with head turns x 10 ?Eyes closed feet together x 30 sec ?Eyes closed feet together with head nods x 10 ?Eyes closed feet together with head turns x 10  ?Repeated eyes closed exercise on level surface - less sway, given for HEP.  ?At counter: ?Tandem stance x 30 sec bil ?Balance board frontal plane x 10, hold x 1 min, sagittal plane x 10, hold 1 min  ? ?11/24/21 Self Care - see patient education.   ? ? ?PATIENT EDUCATION:  ?Education details: HEP update  ?Person educated:  Patient ?Education method: Explanation, Demonstration, Verbal cues, and Handouts ?Education comprehension: verbalized understanding ? ? ?HOME EXERCISE PROGRAM: ?Access Code: [email or web address] ? ?ASSESSMENT: ? ?CLINICAL IMPRESSION: ?KEMPER HOCHMAN arrive late to session today, has not been consistent with HEP.  Focused today on progressing HEP for LE strengthening exercises, able to perform all without discomfort or difficulty.  Also reviewed standing balance exercises in corner - very unsteady with eyes closed on airex.  Given eyes closed exercises for at home to help with habituation.  She would benefit from continued skilled therapy.   ? ? ? ?GOALS: ?Goals reviewed with patient? Yes ? ?SHORT TERM GOALS: Target date: 12/08/2021   ? ?Independent with initial HEP ?Baseline: ?Goal status: IN PROGRESS ? ?2.  Complete ABC scale  ?Baseline:  ?  Goal status: MET ? ? ?LONG TERM GOALS: Target date: 01/05/2022   ? ?Ind with progresed HEP to improve outcomes.  ?Baseline:  ?Goal status: IN PROGRESS ? ?2.  Be able to ascend/descend stairs with 1 HR and good eccentric control for safety.  ?Baseline:  ?Goal status: IN PROGRESS ? ?3.  Be able to walk 15 minutes over even and uneven surfaces to return to work ?Baseline:  ?Goal status: IN PROGRESS ? ?4.  Be able to maintain tandem stance x 30 sec bil  ?Baseline: 30 sec L, 10 sec R   11/29/21- maintained 30 sec bil  ?Goal status: MET ? ?5.  Improve functional strength by improving 5x STS score to <14 sec to decrease risk of falls ?Baseline: 18.6 sec  ?Goal status: IN PROGRESS ? ?6.  Improve DGI to >19/24 to decrease fall risk  ?Baseline: 16/24 ?Goal status: IN PROGRESS ? ? ?PLAN: ?PT FREQUENCY: 2x/week ? ?PT DURATION: 6 weeks ? ?PLANNED INTERVENTIONS: Therapeutic exercises, Therapeutic activity, Neuromuscular re-education, Balance training, Gait training, Patient/Family education, Joint mobilization, Stair training, Cryotherapy, Moist heat, and Manual therapy ? ?PLAN FOR NEXT SESSION: continue LE  and balance exercises.   Add steps for stair endurance.  ? ? ?Rennie Natter, PT, DPT  ?11/29/2021, 5:49 PM  ?

## 2021-11-29 NOTE — Telephone Encounter (Signed)
-----   Message from Althea Charon, [place] sent at 11/19/2021  3:43 PM EDT ----- ?Please refer to [facility] due to restrictive lung disease.  Is a current, but for sleep apnea only. ?

## 2021-12-02 ENCOUNTER — Ambulatory Visit: Payer: Medicare Other

## 2021-12-02 DIAGNOSIS — M6281 Muscle weakness (generalized): Secondary | ICD-10-CM

## 2021-12-02 DIAGNOSIS — R269 Unspecified abnormalities of gait and mobility: Secondary | ICD-10-CM | POA: Diagnosis not present

## 2021-12-02 DIAGNOSIS — R2681 Unsteadiness on feet: Secondary | ICD-10-CM

## 2021-12-02 DIAGNOSIS — R262 Difficulty in walking, not elsewhere classified: Secondary | ICD-10-CM

## 2021-12-02 NOTE — Therapy (Signed)
OUTPATIENT PHYSICAL THERAPY  Treatment Note Patient Name: Monica Wallace MRN: [id number] DOB:05-14-1951, 71 y.o., female Today's Date: 12/02/2021   PT End of Session - 12/02/21 1752     Visit Number 3    Number of Visits 12    Date for PT Re-Evaluation 01/05/22    Authorization Type Medicare & AARP    PT Start Time 1703    PT Stop Time 0923    PT Time Calculation (min) 42 min    Activity Tolerance Patient tolerated treatment well    Behavior During Therapy [facility] for tasks assessed/performed              Past Medical History:  Diagnosis Date   Arthritis    Asthma    Baker's cyst of knee, right 02/16/2017   Cancer ([place]) 2002   thyroid (2002)   Cystic breast    Diabetes ([place])    GERD (gastroesophageal reflux disease)    Hepatic cyst 02/02/2015   Hyperlipidemia    Hypertension    Insomnia 07/23/2016   Menopause    Migraine    Palpitations    Preventative health care 01/18/2016   Sleep apnea 02/02/2015   Thyroid disease    Past Surgical History:  Procedure Laterality Date   ABDOMINAL HYSTERECTOMY     took right ovary and uterus   APPENDECTOMY     CARPAL TUNNEL RELEASE Right    CESAREAN SECTION     CHOLECYSTECTOMY     EYE SURGERY Bilateral 2017   cataracts   KNEE ARTHROPLASTY     SHOULDER ARTHROSCOPY W/ ROTATOR CUFF REPAIR Left    THYROIDECTOMY  2002   Patient Active Problem List   Diagnosis Date Noted   Unsteady gait 11/14/2021   Lumbar radiculopathy 09/09/2021   Cervical cancer screening 03/07/2021   Allergic conjunctivitis 04/08/2020   Neck pain 02/06/2020   Hip pain, right 09/30/2019   COVID-19 09/30/2019   Educated about COVID-19 virus infection 12/05/2018   Baker's cyst of knee, right 02/16/2017   Insomnia 07/23/2016   Hypokalemia 07/23/2016   Controlled type 2 diabetes mellitus without complication, without long-term current use of insulin ([place]) 01/24/2016   Preventative health care 01/18/2016   Left-sided low back pain with left-sided sciatica  10/07/2015   Obesity (BMI 30-39.9) 06/05/2015   OSA (obstructive sleep apnea) 02/02/2015   Palpitations 09/19/2014   Plantar fasciitis, bilateral 11/19/2013   Pronation deformity of ankle, acquired 11/19/2013   DJD (degenerative joint disease) 08/26/2013   Fibrocystic breast 08/26/2013   HTN (hypertension) 08/26/2013   GERD (gastroesophageal reflux disease) 08/26/2013   S/P hysterectomy with oophorectomy 08/26/2013   Hypothyroidism 08/26/2013   Chronic hoarseness 08/26/2013   Colon polyp 08/26/2013   Hiatal hernia 08/26/2013   Irritable bowel syndrome 08/26/2013   Moderate persistent asthma 08/26/2013   Allergic rhinoconjunctivitis 08/26/2013   Hyperlipidemia 08/22/2013   Migraine 08/22/2013   Thyroid cancer ([place]) 08/22/2013    PCP: [name] MD  REFERRING PROVIDER: Alric Ran, MD  REFERRING DIAG: R26.9 (ICD-10-CM) - Gait abnormality W19.XXXS (ICD-10-CM) - Fall, sequela  THERAPY DIAG:  Unsteadiness on feet  Muscle weakness (generalized)  Difficulty in walking, not elsewhere classified  ONSET DATE: ~1 month  SUBJECTIVE:   SUBJECTIVE STATEMENT: Pt reports HEP going well, no recent falls.  PERTINENT HISTORY: asthma, hypertension, thyroid disease and diabetes mellitus type 2, cervical radiculopathy, arthritis, history LBP  PAIN:  Are you having pain? 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     OBJECTIVE:   DIAGNOSTIC FINDINGS: MRI cervical spine was unchanged from prior and it showed moderate right-sided C3 thru C7 moderate foraminal stenosis, there were no spinal canal stenosis. MRI Lumbar spine 09/19/21 1. Moderate lower lumbar facet arthrosis, which may serve as a source of local low back pain. 2. No spinal canal or neural foraminal stenosis. MRI Cervical spine 07/31/2021 1. Unchanged examination of the cervical spine with moderate right C3-4 and  mild right C4-5 and left C6-7 neural foraminal stenosis. 2. No spinal canal stenosis   PATIENT SURVEYS:  ABC scale 67% (11/29/2021)   POSTURE:  Forward head posture  LE MMT:  MMT Right 11/24/2021 Left 11/24/2021  Hip flexion 4 5  Hip extension 4 4+  Hip abduction 5 5  Hip adduction 5 5  Knee flexion 5 5  Knee extension 5 5  Ankle dorsiflexion 5 5   (Blank rows = not tested)   FUNCTIONAL TESTS:  5 times sit to stand: 18.6 sec hands on thighs Tandem L foot forward 30 sec, R foot forward 10 sec  mCTSIB condition 1 30 sec, condition 2 30 sec, condition 3 30 sec, condition 4 30 sec but large sway.  DGI: 16/24 high risk for falls.   GAIT: Distance walked: 150  Assistive device utilized: None Level of assistance: Complete Independence Comments:  ModI on stairs with decreased eccentric control R quad descending.  Gait Speed: 0.5 m/s   TODAY'S TREATMENT: 12/02/21 Therapeutic Exexercise: Nu Step [id number] Bil step ups fwd (6')and lateral (4') 10 reps each Step downs fwd with 4' step x 10 Standing hip abd/ext/marching x 10 with UE support 2nd set with 2# weight Heel/toe raises  20x each Seated LAQ with ball squeeze and 2# weights 10 reps; 2nd set with back unsupported for core activation Seated ball squeeze 10x5"; back unsupported  11/29/21 Therapeutic Exercise: to improve strength and mobility.  Verbal and tactile cues throughout for technique. Nustep L5 x 6 min for warm-up and ABCscale At counter with bil UE support for safety: Heel/toe raises x 20 Hip extension 2 x 10 bil  Hip abduction 2 x 10 bil  Hip flexion 2 x 10 bil  Squats 2 x 10   Neuromuscular Reeducation: to improve balance and stability. SBA for safety throughout.    In corner for safety on Airex  Eyes open feet together x 30 sec  Eyes open feet together with head nods x 10 Eyes open feet together with head turns x 10 Eyes closed feet together x 30 sec Eyes closed feet together with head nods x 10 Eyes  closed feet together with head turns x 10  Repeated eyes closed exercise on level surface - less sway, given for HEP.  At counter: Tandem stance x 30 sec bil Balance board frontal plane x 10, hold x 1 min, sagittal plane x 10, hold 1 min   11/24/21 Self Care - see patient education.     PATIENT EDUCATION:  Education details: HEP update  Person educated: Patient Education method: Explanation, Demonstration, Verbal cues, and Handouts Education comprehension: verbalized understanding   HOME EXERCISE PROGRAM: Access Code: [email or web address]  ASSESSMENT:  CLINICAL IMPRESSION: Pt showed a good tolerance of exercises today but does demonstrate more R LE weakness overall. Cueing throughout session to correct form and technique with exercises. Added weight to standing hip strengthening. Also progressed to isolated quad strengthening in sitting to improve  stability with daily ambulation.     GOALS: Goals reviewed with patient? Yes  SHORT TERM GOALS: Target date: 12/08/2021    Independent with initial HEP Baseline: Goal status: IN PROGRESS  2.  Complete ABC scale  Baseline:  Goal status: MET   LONG TERM GOALS: Target date: 01/05/2022    Ind with progresed HEP to improve outcomes.  Baseline:  Goal status: IN PROGRESS  2.  Be able to ascend/descend stairs with 1 HR and good eccentric control for safety.  Baseline:  Goal status: IN PROGRESS  3.  Be able to walk 15 minutes over even and uneven surfaces to return to work Baseline:  Goal status: IN PROGRESS  4.  Be able to maintain tandem stance x 30 sec bil  Baseline: 30 sec L, 10 sec R   11/29/21- maintained 30 sec bil  Goal status: MET  5.  Improve functional strength by improving 5x STS score to <14 sec to decrease risk of falls Baseline: 18.6 sec  Goal status: IN PROGRESS  6.  Improve DGI to >19/24 to decrease fall risk  Baseline: 16/24 Goal status: IN PROGRESS   PLAN: PT FREQUENCY: 2x/week  PT DURATION: 6 weeks  PLANNED  INTERVENTIONS: Therapeutic exercises, Therapeutic activity, Neuromuscular re-education, Balance training, Gait training, Patient/Family education, Joint mobilization, Stair training, Cryotherapy, Moist heat, and Manual therapy  PLAN FOR NEXT SESSION: continue LE and balance exercises. steps for stair endurance.    Artist Pais, PTA 12/02/2021, 6:01 PM

## 2021-12-06 ENCOUNTER — Ambulatory Visit: Payer: Medicare Other

## 2021-12-06 DIAGNOSIS — R269 Unspecified abnormalities of gait and mobility: Secondary | ICD-10-CM | POA: Diagnosis not present

## 2021-12-06 DIAGNOSIS — M6281 Muscle weakness (generalized): Secondary | ICD-10-CM | POA: Diagnosis not present

## 2021-12-06 DIAGNOSIS — R2681 Unsteadiness on feet: Secondary | ICD-10-CM

## 2021-12-06 DIAGNOSIS — R262 Difficulty in walking, not elsewhere classified: Secondary | ICD-10-CM

## 2021-12-06 NOTE — Therapy (Addendum)
OUTPATIENT PHYSICAL THERAPY  Treatment Note Patient Name: JOBY HERSHKOWITZ MRN: [id number] DOB:[birth year], 71 y.o., female Today's Date: 12/06/2021   PT End of Session - 12/06/21 1752     Visit Number 4    Number of Visits 12    Date for PT Re-Evaluation 01/05/22    Authorization Type Medicare & AARP    PT Start Time 3382    PT Stop Time 5053    PT Time Calculation (min) 42 min    Activity Tolerance Patient tolerated treatment well    Behavior During Therapy [facility] for tasks assessed/performed           Rationale for Evaluation and Treatment Rehabilitation     Past Medical History:  Diagnosis Date   Arthritis    Asthma    Baker's cyst of knee, right 02/16/2017   Cancer ([place]) 2002   thyroid (2002)   Cystic breast    Diabetes ([place])    GERD (gastroesophageal reflux disease)    Hepatic cyst 02/02/2015   Hyperlipidemia    Hypertension    Insomnia 07/23/2016   Menopause    Migraine    Palpitations    Preventative health care 01/18/2016   Sleep apnea 02/02/2015   Thyroid disease    Past Surgical History:  Procedure Laterality Date   ABDOMINAL HYSTERECTOMY     took right ovary and uterus   APPENDECTOMY     CARPAL TUNNEL RELEASE Right    CESAREAN SECTION     CHOLECYSTECTOMY     EYE SURGERY Bilateral 2017   cataracts   KNEE ARTHROPLASTY     SHOULDER ARTHROSCOPY W/ ROTATOR CUFF REPAIR Left    THYROIDECTOMY  2002   Patient Active Problem List   Diagnosis Date Noted   Unsteady gait 11/14/2021   Lumbar radiculopathy 09/09/2021   Cervical cancer screening 03/07/2021   Allergic conjunctivitis 04/08/2020   Neck pain 02/06/2020   Hip pain, right 09/30/2019   COVID-19 09/30/2019   Educated about COVID-19 virus infection 12/05/2018   Baker's cyst of knee, right 02/16/2017   Insomnia 07/23/2016   Hypokalemia 07/23/2016   Controlled type 2 diabetes mellitus without complication, without long-term current use of insulin ([place]) 01/24/2016   Preventative health care  01/18/2016   Left-sided low back pain with left-sided sciatica 10/07/2015   Obesity (BMI 30-39.9) 06/05/2015   OSA (obstructive sleep apnea) 02/02/2015   Palpitations 09/19/2014   Plantar fasciitis, bilateral 11/19/2013   Pronation deformity of ankle, acquired 11/19/2013   DJD (degenerative joint disease) 08/26/2013   Fibrocystic breast 08/26/2013   HTN (hypertension) 08/26/2013   GERD (gastroesophageal reflux disease) 08/26/2013   S/P hysterectomy with oophorectomy 08/26/2013   Hypothyroidism 08/26/2013   Chronic hoarseness 08/26/2013   Colon polyp 08/26/2013   Hiatal hernia 08/26/2013   Irritable bowel syndrome 08/26/2013   Moderate persistent asthma 08/26/2013   Allergic rhinoconjunctivitis 08/26/2013   Hyperlipidemia 08/22/2013   Migraine 08/22/2013   Thyroid cancer ([place]) 08/22/2013    PCP: [name] MD  REFERRING PROVIDER: Alric Ran, MD  REFERRING DIAG: R26.9 (ICD-10-CM) - Gait abnormality W19.XXXS (ICD-10-CM) - Fall, sequela  THERAPY DIAG:  Unsteadiness on feet  Muscle weakness (generalized)  Difficulty in walking, not elsewhere classified  ONSET DATE: ~1 month  SUBJECTIVE:   SUBJECTIVE STATEMENT: Pt reports doing ok today.  PERTINENT HISTORY: asthma, hypertension, thyroid disease and diabetes mellitus type 2, cervical radiculopathy, arthritis, history LBP  PAIN:  Are you having pain? 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     OBJECTIVE:   DIAGNOSTIC FINDINGS: MRI cervical spine was unchanged from prior and it showed moderate right-sided C3 thru C7 moderate foraminal stenosis, there were no spinal canal stenosis. MRI Lumbar spine 09/19/21 1. Moderate lower lumbar facet arthrosis, which may serve as a source of local low back pain. 2. No spinal canal or neural foraminal stenosis. MRI Cervical spine 07/31/2021 1. Unchanged examination of  the cervical spine with moderate right C3-4 and mild right C4-5 and left C6-7 neural foraminal stenosis. 2. No spinal canal stenosis   PATIENT SURVEYS:  ABC scale 67% (11/29/2021)   POSTURE:  Forward head posture  LE MMT:  MMT Right 11/24/2021 Left 11/24/2021  Hip flexion 4 5  Hip extension 4 4+  Hip abduction 5 5  Hip adduction 5 5  Knee flexion 5 5  Knee extension 5 5  Ankle dorsiflexion 5 5   (Blank rows = not tested)   FUNCTIONAL TESTS:  5 times sit to stand: 18.6 sec hands on thighs Tandem L foot forward 30 sec, R foot forward 10 sec  mCTSIB condition 1 30 sec, condition 2 30 sec, condition 3 30 sec, condition 4 30 sec but large sway.  DGI: 16/24 high risk for falls.   GAIT: Distance walked: 150  Assistive device utilized: None Level of assistance: Complete Independence Comments:  ModI on stairs with decreased eccentric control R quad descending.  Gait Speed: 0.5 m/s   TODAY'S TREATMENT: 12/06/21 Therapeutic Exercises: Nu Step L5x1mn Standing hip ABD, flex, ext x 10 red TB at counter Seated horizontal ABD with yellow TB 2x10 Seated anti rot going in circles with yellow TB x 12 each way  Neuromuscular Reeducation: Stepping over obstacles (foam rolls and yoga mats) 3x along counter Stepping onto obstacles (airex and balance disk) fwd and lateral 3x along counter Hip throws standing on airex with yellow TB x 10 Chest press standing on airex with feet together x 10  12/02/21 Therapeutic Exexercise: Nu Step L4x667m Bil step ups fwd (6')and lateral (4') 10 reps each Step downs fwd with 4' step x 10 Standing hip abd/ext/marching x 10 with UE support 2nd set with 2# weight Heel/toe raises  20x each Seated LAQ with ball squeeze and 2# weights 10 reps; 2nd set with back unsupported for core activation Seated ball squeeze 10x5"; back unsupported  11/29/21 Therapeutic Exercise: to improve strength and mobility.  Verbal and tactile cues throughout for  technique. Nustep L5 x 6 min for warm-up and ABCscale At counter with bil UE support for safety: Heel/toe raises x 20 Hip extension 2 x 10 bil  Hip abduction 2 x 10 bil  Hip flexion 2 x 10 bil  Squats 2 x 10   Neuromuscular Reeducation: to improve balance and stability. SBA for safety throughout.    In corner for safety on Airex  Eyes open feet together x 30 sec  Eyes open feet together with head nods x 10 Eyes open feet together with head turns x 10 Eyes closed feet together x 30 sec Eyes closed feet together with head nods x 10 Eyes closed feet together with head turns x 10  Repeated eyes closed exercise on level surface - less sway, given for HEP.  At counter: Tandem stance x 30 sec bil Balance board frontal plane x 10, hold x 1 min, sagittal plane x 10, hold 1 min  PATIENT EDUCATION:  Education details: HEP update - hip abd and ext with red TB  Person educated: Patient Education method: Explanation, Demonstration, and Handouts Education comprehension: verbalized understanding and returned demonstration   HOME EXERCISE PROGRAM: Access Code: [email or web address]  ASSESSMENT:  CLINICAL IMPRESSION: Pt showed some difficulty with higher level balance exercises today. She had LOB with stepping fwd with R LE onto ariex pad which required mod A to recover. She was unsteady with stepping onto obstacles mostly fwd and lateral. She did however show a good capacity for the progression of exercises today but by the end she was reporting some glute med fatigue from the WB.     GOALS: Goals reviewed with patient? Yes  SHORT TERM GOALS: Target date: 12/08/2021    Independent with initial HEP Baseline: Goal status: IN PROGRESS  2.  Complete ABC scale  Baseline:  Goal status: MET   LONG TERM GOALS: Target date: 01/05/2022    Ind with progresed HEP to improve outcomes.  Baseline:  Goal status: IN PROGRESS  2.  Be able to ascend/descend stairs with 1 HR and good eccentric control for  safety.  Baseline:  Goal status: IN PROGRESS  3.  Be able to walk 15 minutes over even and uneven surfaces to return to work Baseline:  Goal status: IN PROGRESS  4.  Be able to maintain tandem stance x 30 sec bil  Baseline: 30 sec L, 10 sec R   11/29/21- maintained 30 sec bil  Goal status: MET  5.  Improve functional strength by improving 5x STS score to <14 sec to decrease risk of falls Baseline: 18.6 sec  Goal status: IN PROGRESS  6.  Improve DGI to >19/24 to decrease fall risk  Baseline: 16/24 Goal status: IN PROGRESS   PLAN: PT FREQUENCY: 2x/week  PT DURATION: 6 weeks  PLANNED INTERVENTIONS: Therapeutic exercises, Therapeutic activity, Neuromuscular re-education, Balance training, Gait training, Patient/Family education, Joint mobilization, Stair training, Cryotherapy, Moist heat, and Manual therapy  PLAN FOR NEXT SESSION: continue LE and balance exercises. steps for stair endurance.    Artist Pais, PTA 12/06/2021, 5:59 PM

## 2021-12-07 ENCOUNTER — Ambulatory Visit: Payer: Medicare Other | Admitting: Family

## 2021-12-09 ENCOUNTER — Ambulatory Visit: Payer: Medicare Other

## 2021-12-14 ENCOUNTER — Ambulatory Visit: Payer: Medicare Other | Admitting: Internal Medicine

## 2021-12-14 ENCOUNTER — Other Ambulatory Visit: Payer: Self-pay

## 2021-12-14 MED ORDER — FLUTICASONE PROPIONATE 50 MCG/ACT NA SUSP: NASAL | 1 refills | Status: DC

## 2021-12-15 ENCOUNTER — Ambulatory Visit: Payer: Medicare Other | Admitting: Physical Therapy

## 2021-12-15 ENCOUNTER — Encounter: Payer: Self-pay | Admitting: Physical Therapy

## 2021-12-15 DIAGNOSIS — I10 Essential (primary) hypertension: Secondary | ICD-10-CM | POA: Diagnosis not present

## 2021-12-15 DIAGNOSIS — R262 Difficulty in walking, not elsewhere classified: Secondary | ICD-10-CM

## 2021-12-15 DIAGNOSIS — R2681 Unsteadiness on feet: Secondary | ICD-10-CM | POA: Diagnosis not present

## 2021-12-15 DIAGNOSIS — J45909 Unspecified asthma, uncomplicated: Secondary | ICD-10-CM

## 2021-12-15 DIAGNOSIS — M6281 Muscle weakness (generalized): Secondary | ICD-10-CM

## 2021-12-15 DIAGNOSIS — E785 Hyperlipidemia, unspecified: Secondary | ICD-10-CM | POA: Diagnosis not present

## 2021-12-15 DIAGNOSIS — R269 Unspecified abnormalities of gait and mobility: Secondary | ICD-10-CM | POA: Diagnosis not present

## 2021-12-15 NOTE — Therapy (Signed)
OUTPATIENT PHYSICAL THERAPY  Treatment Note Patient Name: Monica Wallace MRN: [id number] DOB:July 15, 1951, 71 y.o., female Today's Date: 12/15/2021   PT End of Session - 12/15/21 1708     Visit Number 5    Number of Visits 12    Date for PT Re-Evaluation 01/05/22    Authorization Type Medicare & AARP    PT Start Time 1700    Activity Tolerance Patient tolerated treatment well    Behavior During Therapy [facility] for tasks assessed/performed           Rationale for Evaluation and Treatment Rehabilitation     Past Medical History:  Diagnosis Date   Arthritis    Asthma    Baker's cyst of knee, right 02/16/2017   Cancer ([place]) 2002   thyroid (2002)   Cystic breast    Diabetes ([place])    GERD (gastroesophageal reflux disease)    Hepatic cyst 02/02/2015   Hyperlipidemia    Hypertension    Insomnia 07/23/2016   Menopause    Migraine    Palpitations    Preventative health care 01/18/2016   Sleep apnea 02/02/2015   Thyroid disease    Past Surgical History:  Procedure Laterality Date   ABDOMINAL HYSTERECTOMY     took right ovary and uterus   APPENDECTOMY     CARPAL TUNNEL RELEASE Right    CESAREAN SECTION     CHOLECYSTECTOMY     EYE SURGERY Bilateral 2017   cataracts   KNEE ARTHROPLASTY     SHOULDER ARTHROSCOPY W/ ROTATOR CUFF REPAIR Left    THYROIDECTOMY  2002   Patient Active Problem List   Diagnosis Date Noted   Unsteady gait 11/14/2021   Lumbar radiculopathy 09/09/2021   Cervical cancer screening 03/07/2021   Allergic conjunctivitis 04/08/2020   Neck pain 02/06/2020   Hip pain, right 09/30/2019   COVID-19 09/30/2019   Educated about COVID-19 virus infection 12/05/2018   Baker's cyst of knee, right 02/16/2017   Insomnia 07/23/2016   Hypokalemia 07/23/2016   Controlled type 2 diabetes mellitus without complication, without long-term current use of insulin ([place]) 01/24/2016   Preventative health care 01/18/2016   Left-sided low back pain with left-sided sciatica  10/07/2015   Obesity (BMI 30-39.9) 06/05/2015   OSA (obstructive sleep apnea) 02/02/2015   Palpitations 09/19/2014   Plantar fasciitis, bilateral 11/19/2013   Pronation deformity of ankle, acquired 11/19/2013   DJD (degenerative joint disease) 08/26/2013   Fibrocystic breast 08/26/2013   HTN (hypertension) 08/26/2013   GERD (gastroesophageal reflux disease) 08/26/2013   S/P hysterectomy with oophorectomy 08/26/2013   Hypothyroidism 08/26/2013   Chronic hoarseness 08/26/2013   Colon polyp 08/26/2013   Hiatal hernia 08/26/2013   Irritable bowel syndrome 08/26/2013   Moderate persistent asthma 08/26/2013   Allergic rhinoconjunctivitis 08/26/2013   Hyperlipidemia 08/22/2013   Migraine 08/22/2013   Thyroid cancer ([place]) 08/22/2013    PCP: [name] MD  REFERRING PROVIDER: Alric Ran, MD  REFERRING DIAG: R26.9 (ICD-10-CM) - Gait abnormality W19.XXXS (ICD-10-CM) - Fall, sequela  THERAPY DIAG:  Unsteadiness on feet  Muscle weakness (generalized)  Difficulty in walking, not elsewhere classified  ONSET DATE: ~1 month  SUBJECTIVE:   SUBJECTIVE STATEMENT: Pt. Reports that her L heel is bothering her, has a little plantar fasciitis, walking in flat shoes today.   Also having trouble with bakers cyst.  Reports she lost her balance today because her R foot dragged, but didn't fall.    PERTINENT HISTORY: asthma, hypertension, thyroid disease and diabetes mellitus  type 2, cervical radiculopathy, arthritis, history LBP  PAIN:  Are you having pain? 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     OBJECTIVE:   DIAGNOSTIC FINDINGS: MRI cervical spine was unchanged from prior and it showed moderate right-sided C3 thru C7 moderate foraminal stenosis, there were no spinal canal stenosis. MRI Lumbar spine 09/19/21 1. Moderate lower lumbar  facet arthrosis, which may serve as a source of local low back pain. 2. No spinal canal or neural foraminal stenosis. MRI Cervical spine 07/31/2021 1. Unchanged examination of the cervical spine with moderate right C3-4 and mild right C4-5 and left C6-7 neural foraminal stenosis. 2. No spinal canal stenosis   PATIENT SURVEYS:  ABC scale 67% (11/29/2021)   POSTURE:  Forward head posture  LE MMT:  MMT Right 11/24/2021 Left 11/24/2021  Hip flexion 4 5  Hip extension 4 4+  Hip abduction 5 5  Hip adduction 5 5  Knee flexion 5 5  Knee extension 5 5  Ankle dorsiflexion 5 5   (Blank rows = not tested)   FUNCTIONAL TESTS:  5 times sit to stand: 18.6 sec hands on thighs Tandem L foot forward 30 sec, R foot forward 10 sec  mCTSIB condition 1 30 sec, condition 2 30 sec, condition 3 30 sec, condition 4 30 sec but large sway.  DGI: 16/24 high risk for falls.   GAIT: Distance walked: 150  Assistive device utilized: None Level of assistance: Complete Independence Comments:  ModI on stairs with decreased eccentric control R quad descending.  Gait Speed: 0.5 m/s   TODAY'S TREATMENT: 12/15/21 Therapeutic Exercise: to improve strength and mobility.  Demo, verbal and tactile cues throughout for technique. Nustep L5 x 6 min for warm-up and  At counter with bil UE support for safety: Heel/toe raises x 20 Hip extension 2 x 10 bil  Hip abduction 2 x 10 bil  Hip flexion 2 x 10 bil  Squats 2 x 10 Fwd steps over rolled green yoga mat 2x10, no UE support Rockerboard ant/post 2x20 reps - progressed to holds into DF/PF 3x5 sec each way Seated R ankle DF with yellow TB X 20 R runner stretch x 30 sec 12/06/21 Therapeutic Exercises: Nu Step L5x29mn Standing hip ABD, flex, ext x 10 red TB at counter Seated horizontal ABD with yellow TB 2x10 Seated anti rot going in circles with yellow TB x 12 each way  Neuromuscular Reeducation: Stepping over obstacles (foam rolls and yoga mats) 3x along  counter Stepping onto obstacles (airex and balance disk) fwd and lateral 3x along counter Hip throws standing on airex with yellow TB x 10 Chest press standing on airex with feet together x 10  12/02/21 Therapeutic Exexercise: Nu Step L4x645m Bil step ups fwd (6')and lateral (4') 10 reps each Step downs fwd with 4' step x 10 Standing hip abd/ext/marching x 10 with UE support 2nd set with 2# weight Heel/toe raises  20x each Seated LAQ with ball squeeze and 2# weights 10 reps; 2nd set with back unsupported for core activation Seated ball squeeze 10x5"; back unsupported     PATIENT EDUCATION:  Education details: HEP update - hip abd and ext with red TB  Person educated: Patient Education method: Explanation, Demonstration, and Handouts Education comprehension: verbalized understanding and returned demonstration   HOME EXERCISE PROGRAM: Access Code: [email or web address]:  CLINICAL IMPRESSION: Pt came in reporting that R foot drags when walking and it is more when she first gets going in the am. Focused interventions on increasing ankle DF by way of strength and ROM. She was challenged by the rockerboard post WS. Cues for ecc control with fwd stepping over yoga mat with R LE. Provided instruction on importance of heel-toe walking pattern to reduce occurrence of foot drag. Pt showed a good response to treatment.  GOALS: Goals reviewed with patient? Yes  SHORT TERM GOALS: Target date: 12/08/2021    Independent with initial HEP Baseline: Goal status: MET  2.  Complete ABC scale  Baseline:  Goal status: MET   LONG TERM GOALS: Target date: 01/05/2022    Ind with progresed HEP to improve outcomes.  Baseline:  Goal status: IN PROGRESS  2.  Be able to ascend/descend stairs with 1 HR and good eccentric control for safety.  Baseline:  Goal status: IN PROGRESS  3.  Be able to walk 15 minutes over even and uneven surfaces to return to work Baseline:  Goal status: IN  PROGRESS  4.  Be able to maintain tandem stance x 30 sec bil  Baseline: 30 sec L, 10 sec R   11/29/21- maintained 30 sec bil  Goal status: MET  5.  Improve functional strength by improving 5x STS score to <14 sec to decrease risk of falls Baseline: 18.6 sec  Goal status: IN PROGRESS  6.  Improve DGI to >19/24 to decrease fall risk  Baseline: 16/24 Goal status: IN PROGRESS   PLAN: PT FREQUENCY: 2x/week  PT DURATION: 6 weeks  PLANNED INTERVENTIONS: Therapeutic exercises, Therapeutic activity, Neuromuscular re-education, Balance training, Gait training, Patient/Family education, Joint mobilization, Stair training, Cryotherapy, Moist heat, and Manual therapy  PLAN FOR NEXT SESSION: continue LE and balance exercises. steps for stair endurance.    Clarene Essex, PTA 12/15/2021, 5:09 PM

## 2021-12-16 LAB — HM DIABETES EYE EXAM

## 2021-12-18 DIAGNOSIS — M6281 Muscle weakness (generalized): Secondary | ICD-10-CM | POA: Diagnosis not present

## 2021-12-20 ENCOUNTER — Ambulatory Visit (INDEPENDENT_AMBULATORY_CARE_PROVIDER_SITE_OTHER): Payer: Medicare Other | Admitting: Pulmonary Disease

## 2021-12-20 ENCOUNTER — Ambulatory Visit: Payer: Medicare Other | Attending: Neurology

## 2021-12-20 ENCOUNTER — Encounter: Payer: Self-pay | Admitting: Pulmonary Disease

## 2021-12-20 VITALS — BP 120/78 | HR 94 | Temp 97.9°F | Ht 62.0 in | Wt 205.0 lb

## 2021-12-20 DIAGNOSIS — R0609 Other forms of dyspnea: Secondary | ICD-10-CM

## 2021-12-20 DIAGNOSIS — M6281 Muscle weakness (generalized): Secondary | ICD-10-CM | POA: Insufficient documentation

## 2021-12-20 DIAGNOSIS — R262 Difficulty in walking, not elsewhere classified: Secondary | ICD-10-CM | POA: Diagnosis not present

## 2021-12-20 DIAGNOSIS — R2681 Unsteadiness on feet: Secondary | ICD-10-CM | POA: Insufficient documentation

## 2021-12-20 NOTE — Patient Instructions (Signed)
Schedule for PFT  Follow-up in 4 to 6 weeks PFT on day of next visit  Continue weight loss efforts  Call with significant concerns

## 2021-12-20 NOTE — Progress Notes (Signed)
Monica Wallace    [id number]    [birth year]  Primary Care Physician:Camara, Maryan Puls, MD  Referring Physician: Alric Ran, [facility] [address] [place],  [place] [address]  Chief complaint:   Patient being evaluated for obstructive sleep apnea  HPI:  She was diagnosed with obstructive sleep apnea in 2016, used CPAP for a while, at some point was transitioned to BiPAP and subsequently got off pressure therapy  Was not able to tolerate CPAP or BiPAP  Recent concerned about sleep apnea because of fatigue Had a recent study showing moderate obstructive sleep apnea  She does not feel she will be able to tolerate CPAP Some degree of claustrophobia, did try different masks in the past  She has an occasional cough Some arthritis Sciatica, weakness on the right side -MRIs have not shown a stroke -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, plantar fasciitis  Did have COVID in 2021, was not hospitalized for it She describes episodes of some weakness of her arms in the past-work-up was negative with CTs, MRI  She does have some cough, wheezing -Uses Pulmicort twice a day, albuterol only as needed -We will end up using albuterol about once a week  Rare headaches Rare night sweats Memory is good  She does have hypertension, asthma, diabetes, hypercholesterolemia, sinus and allergy problems, hypothyroidism  Never smoker  Tries to go to sleep between 8 and 9 PM Takes a few minutes to fall asleep 1-2 awakenings Final wake up time about 430  Weight is recently down about 10 pounds   Outpatient Encounter Medications as of 12/20/2021  Medication Sig   acetaminophen (TYLENOL) 325 MG tablet Take 650 mg by mouth every 6 (six) hours as needed.   albuterol (PROVENTIL) (2.5 MG/3ML) 0.083% nebulizer solution Take 3 mLs (2.5 mg total) by nebulization every 6 (six) hours as needed for wheezing or shortness of breath.   albuterol (VENTOLIN HFA) 108 (90 Base) MCG/ACT  inhaler INHALE 2 PUFFS INTO THE LUNGS EVERY 6 HOURS AS NEEDED FOR WHEEZING OR SHORTNESS OF BREATH   azelastine (ASTELIN) 0.1 % nasal spray Place 1 spray in each nostril twice a day as needed for runny nose/drainage down throat   Blood Glucose Monitoring Suppl (ONETOUCH VERIO FLEX SYSTEM) w/Device KIT Use to check blood glucose once a day.  DX code: E11.9   budesonide (PULMICORT FLEXHALER) 180 MCG/ACT inhaler Inhale 2 puffs into the lungs daily.   Cholecalciferol ([employer] VIT D 5000 HIGH-POTENCY PO) Take 5,000 Units by mouth daily.   estradiol (ESTRACE VAGINAL) 0.1 MG/GM vaginal cream Place 1 Applicatorful vaginally at bedtime.   fluticasone (FLONASE) 50 MCG/ACT nasal spray SHAKE LIQUID AND USE 2 SPRAYS IN EACH NOSTRIL DAILY   glucose blood (ONETOUCH VERIO) test strip TEST BLOOD GLUCOSE ONCE A DAY   hyoscyamine (LEVSIN SL) 0.125 MG SL tablet Place 1 tablet (0.125 mg total) under the tongue every 4 (four) hours as needed.   Lancets (ONETOUCH DELICA PLUS [id number]) MISC TEST BLOOD SUGAR ONCE A DAY   levothyroxine (SYNTHROID) 137 MCG tablet Take 1 tablet (137 mcg total) by mouth daily before breakfast. NAME BRAND ONLY   loratadine (CLARITIN) 10 MG tablet Take 10 mg by mouth daily.   montelukast (SINGULAIR) 10 MG tablet TAKE 1 TABLET(10 MG) BY MOUTH DAILY AS NEEDED (Patient taking differently: Take 10 mg by mouth daily. TAKE 1 TABLET(10 MG) BY MOUTH DAILY AS NEEDED)   Omega-3 1000 MG CAPS Take  1,000 mg by mouth daily.   omeprazole (PRILOSEC) 40 MG capsule Take 1 capsule by mouth daily.   potassium chloride SA (KLOR-CON M) 20 MEQ tablet TAKE 2 TABLETS BY MOUTH DAILY FOR 3 DAYS. THEN TAKE 1 TABLET BY MOUTH DAILY.   rosuvastatin (CRESTOR) 5 MG tablet TAKE 1 TAKE ONE TABLET EVERY__ HOURS TAKE ONE TABLET EVERY__ HOURS TAB BY MOUTH 2 TIMES A WEEK(TUE AND SUN WEEKLY)   triamterene-hydrochlorothiazide (MAXZIDE-25) 37.5-25 MG tablet Take 1 tablet by mouth daily.   No facility-administered encounter medications on  file as of 12/20/2021.    Allergies as of 12/20/2021 - Review Complete 12/20/2021  Allergen Reaction Noted   Levaquin [levofloxacin] Shortness Of Breath 08/22/2013   Penicillins Other (See Comments) 08/22/2013   Clindamycin/lincomycin Other (See Comments) 11/20/2017   Codeine Itching 08/22/2013   Gabapentin Itching 09/02/2019    Past Medical History:  Diagnosis Date   Arthritis    Asthma    Baker's cyst of knee, right 02/16/2017   Cancer ([place]) 2002   thyroid (2002)   Cystic breast    Diabetes ([place])    GERD (gastroesophageal reflux disease)    Hepatic cyst 02/02/2015   Hyperlipidemia    Hypertension    Insomnia 07/23/2016   Menopause    Migraine    Palpitations    Preventative health care 01/18/2016   Sleep apnea 02/02/2015   Thyroid disease     Past Surgical History:  Procedure Laterality Date   ABDOMINAL HYSTERECTOMY     took right ovary and uterus   APPENDECTOMY     CARPAL TUNNEL RELEASE Right    CESAREAN SECTION     CHOLECYSTECTOMY     EYE SURGERY Bilateral 2017   cataracts   KNEE ARTHROPLASTY     SHOULDER ARTHROSCOPY W/ ROTATOR CUFF REPAIR Left    THYROIDECTOMY  2002    Family History  Problem Relation Age of Onset   Arthritis Mother    Transient ischemic attack Mother    Hypertension Mother    Hyperlipidemia Mother    Diabetes Father    Heart disease Father    Arthritis Father    Kidney disease Father    Hypertension Father    Hyperlipidemia Father    Cancer Sister 53       pancreatic   Fibromyalgia Sister    Allergic rhinitis Sister    Asthma Maternal Grandfather        smoker   Depression Maternal Grandfather    Dementia Paternal Grandmother    Diabetes Paternal Grandfather    Hypertension Brother    Allergic rhinitis Brother    Scoliosis Daughter        had rods placed and removed   Arthritis Sister    Gout Sister    GER disease Sister    Cancer Sister        breast cancer   Breast cancer Sister 44    Social History    Socioeconomic History   Marital status: Divorced    Spouse name: Not on file   Number of children: 2   Years of education: Not on file   Highest education level: Not on file  Occupational History   Not on file  Tobacco Use   Smoking status: Never   Smokeless tobacco: Never  Vaping Use   Vaping Use: Never used  Substance and Sexual Activity   Alcohol use: Yes    Alcohol/week: 0.0 standard drinks    Comment: very rare once or twice a  year   Drug use: No   Sexual activity: Yes    Birth control/protection: Other-see comments, Surgical    Comment:  boyfriend  Other Topics Concern   Not on file  Social History Narrative   Household: pt, daughter and sister    Social Determinants of Health   Financial Resource Strain: Medium Risk   Difficulty of Paying Living Expenses: Somewhat hard  Food Insecurity: No Food Insecurity   Worried About Charity fundraiser in the Last Year: Never true   [place] in the Last Year: Never true  Transportation Needs: No Transportation Needs   Lack of Transportation (Medical): No   Lack of Transportation (Non-Medical): No  Physical Activity: Inactive   Days of Exercise per Week: 0 days   Minutes of Exercise per Session: 0 min  Stress: No Stress Concern Present   Feeling of Stress : Not at all  Social Connections: Moderately Isolated   Frequency of Communication with Friends and Family: More than three times a week   Frequency of Social Gatherings with Friends and Family: More than three times a week   Attends Religious Services: More than 4 times per year   Active Member of [employer] or Organizations: No   Attends Archivist Meetings: Never   Marital Status: Divorced  Human resources officer Violence: Not At Risk   Fear of Current or Ex-Partner: No   Emotionally Abused: No   Physically Abused: No   Sexually Abused: No    Review of Systems  Constitutional:  Positive for fatigue.  Respiratory:  Positive for cough.    Vitals:    12/20/21 0857  BP: 120/78  Pulse: 94  Temp: 97.9 F (36.6 C)  SpO2: 97%     Physical Exam Constitutional:      Appearance: She is obese.  HENT:     Head: Normocephalic.     Mouth/Throat:     Mouth: Mucous membranes are moist.  Cardiovascular:     Rate and Rhythm: Normal rate and regular rhythm.     Heart sounds: No murmur heard.   No friction rub.  Pulmonary:     Effort: No respiratory distress.     Breath sounds: No stridor. No wheezing, rhonchi or rales.  Musculoskeletal:     Cervical back: No rigidity or tenderness.  Neurological:     Mental Status: She is alert.  Psychiatric:        Mood and Affect: Mood normal.       08/16/2021    3:00 PM 06/01/2016    1:00 PM  Results of the Epworth flowsheet  Sitting and reading 0 1  Watching TV 0 1  Sitting, inactive in a public place (e.g. a theatre or a meeting) 0 0  As a passenger in a car for an hour without a break 2 1  Lying down to rest in the afternoon when circumstances permit 2 3  Sitting and talking to someone 0 0  Sitting quietly after a lunch without alcohol 0 1  In a car, while stopped for a few minutes in traffic 0 0  Total score 4 7    Data Reviewed:  No downloads from previous compliance available  Previous study reviewed showing AHI of 5.2  Home sleep study 10/22/2021 shows moderate obstructive sleep apnea with an AHI of 15.1  Assessment:  Past history of mild obstructive sleep apnea-now diagnosed with moderate obstructive sleep apnea -Did not tolerate CPAP well -At some point was tried  on BiPAP as well -Was not tolerated -She believes she does have some degree of claustrophobia  Still with daytime sleepiness and fatigue  Abnormal spirometry's with reduced FVC -She does have an occasional cough -Concerned about restrictive lung disease -Weight has been stable  Moderate obstructive sleep apnea -Options of treatment discussed including an inspire device -BMI is elevated at 37 -Offered  referral to one of the providers who perform inspire implantation -She wants to continue working on weight loss at present and then will reconsider Pathophysiology of sleep disordered breathing discussed with the patient  Plan/Recommendations: Schedule patient for full PFT  May require a CT scan of the chest  We will consider blood work if CT abnormal  We will have her back in about 4 to 6 weeks to discuss options of treatment  Encouraged to call with significant concerns   Sherrilyn Rist MD [facility] 12/20/2021, 9:12 AM  CC: Alric Ran, MD

## 2021-12-20 NOTE — Therapy (Signed)
OUTPATIENT PHYSICAL THERAPY  Treatment Note Patient Name: Monica Wallace MRN: [id number] DOB:Mar 02, 1951, 71 y.o., female Today's Date: 12/20/2021   PT End of Session - 12/20/21 1753     Visit Number 6    Number of Visits 12    Date for PT Re-Evaluation 01/05/22    Authorization Type Medicare & AARP    PT Start Time 3546    PT Stop Time 1741    PT Time Calculation (min) 44 min    Activity Tolerance Patient tolerated treatment well    Behavior During Therapy [facility] for tasks assessed/performed            Rationale for Evaluation and Treatment Rehabilitation     Past Medical History:  Diagnosis Date   Arthritis    Asthma    Baker's cyst of knee, right 02/16/2017   Cancer ([place]) 2002   thyroid (2002)   Cystic breast    Diabetes ([place])    GERD (gastroesophageal reflux disease)    Hepatic cyst 02/02/2015   Hyperlipidemia    Hypertension    Insomnia 07/23/2016   Menopause    Migraine    Palpitations    Preventative health care 01/18/2016   Sleep apnea 02/02/2015   Thyroid disease    Past Surgical History:  Procedure Laterality Date   ABDOMINAL HYSTERECTOMY     took right ovary and uterus   APPENDECTOMY     CARPAL TUNNEL RELEASE Right    CESAREAN SECTION     CHOLECYSTECTOMY     EYE SURGERY Bilateral 2017   cataracts   KNEE ARTHROPLASTY     SHOULDER ARTHROSCOPY W/ ROTATOR CUFF REPAIR Left    THYROIDECTOMY  2002   Patient Active Problem List   Diagnosis Date Noted   Unsteady gait 11/14/2021   Lumbar radiculopathy 09/09/2021   Cervical cancer screening 03/07/2021   Allergic conjunctivitis 04/08/2020   Neck pain 02/06/2020   Hip pain, right 09/30/2019   COVID-19 09/30/2019   Educated about COVID-19 virus infection 12/05/2018   Baker's cyst of knee, right 02/16/2017   Insomnia 07/23/2016   Hypokalemia 07/23/2016   Controlled type 2 diabetes mellitus without complication, without long-term current use of insulin ([place]) 01/24/2016   Preventative health care  01/18/2016   Left-sided low back pain with left-sided sciatica 10/07/2015   Obesity (BMI 30-39.9) 06/05/2015   OSA (obstructive sleep apnea) 02/02/2015   Palpitations 09/19/2014   Plantar fasciitis, bilateral 11/19/2013   Pronation deformity of ankle, acquired 11/19/2013   DJD (degenerative joint disease) 08/26/2013   Fibrocystic breast 08/26/2013   HTN (hypertension) 08/26/2013   GERD (gastroesophageal reflux disease) 08/26/2013   S/P hysterectomy with oophorectomy 08/26/2013   Hypothyroidism 08/26/2013   Chronic hoarseness 08/26/2013   Colon polyp 08/26/2013   Hiatal hernia 08/26/2013   Irritable bowel syndrome 08/26/2013   Moderate persistent asthma 08/26/2013   Allergic rhinoconjunctivitis 08/26/2013   Hyperlipidemia 08/22/2013   Migraine 08/22/2013   Thyroid cancer ([place]) 08/22/2013    PCP: [name] MD  REFERRING PROVIDER: Alric Ran, MD  REFERRING DIAG: R26.9 (ICD-10-CM) - Gait abnormality W19.XXXS (ICD-10-CM) - Fall, sequela  THERAPY DIAG:  Unsteadiness on feet  Muscle weakness (generalized)  Difficulty in walking, not elsewhere classified  ONSET DATE: ~1 month  SUBJECTIVE:   SUBJECTIVE STATEMENT: "Been a lot more active over the weekend, I am now more confident going out to more places."  PERTINENT HISTORY: asthma, hypertension, thyroid disease and diabetes mellitus type 2, cervical radiculopathy, arthritis, history LBP  PAIN:  Are you having pain? 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     OBJECTIVE:   DIAGNOSTIC FINDINGS: MRI cervical spine was unchanged from prior and it showed moderate right-sided C3 thru C7 moderate foraminal stenosis, there were no spinal canal stenosis. MRI Lumbar spine 09/19/21 1. Moderate lower lumbar facet arthrosis, which may serve as a source of local low back pain. 2. No  spinal canal or neural foraminal stenosis. MRI Cervical spine 07/31/2021 1. Unchanged examination of the cervical spine with moderate right C3-4 and mild right C4-5 and left C6-7 neural foraminal stenosis. 2. No spinal canal stenosis   PATIENT SURVEYS:  ABC scale 67% (11/29/2021)   POSTURE:  Forward head posture  LE MMT:  MMT Right 11/24/2021 Left 11/24/2021  Hip flexion 4 5  Hip extension 4 4+  Hip abduction 5 5  Hip adduction 5 5  Knee flexion 5 5  Knee extension 5 5  Ankle dorsiflexion 5 5   (Blank rows = not tested)   FUNCTIONAL TESTS:  5 times sit to stand: 18.6 sec hands on thighs Tandem L foot forward 30 sec, R foot forward 10 sec  mCTSIB condition 1 30 sec, condition 2 30 sec, condition 3 30 sec, condition 4 30 sec but large sway.  DGI: 16/24 high risk for falls.   GAIT: Distance walked: 150  Assistive device utilized: None Level of assistance: Complete Independence Comments:  ModI on stairs with decreased eccentric control R quad descending.  Gait Speed: 0.5 m/s   TODAY'S TREATMENT: 12/20/21 Therapeutic Exercise: Nustep L6x53mn Hip throws (D2 flexion) bil with yellow weight ball 10x seated on airex and airex under feet Seated trunk rotation with GTB 15x R/L Seated pallof press with GTB 15x R/L  Neuromuscular Re-education: Staggered stance holding balance 1 trial EO:2nd trial EC Bil staggered stance with ant/post WS 10x Gait with horizontal head turns 2x170 ft - decreased strides when looking to L Gait with vertical head turns 2x170 ft Retro gait 80 ft  12/15/21 Therapeutic Exercise: to improve strength and mobility.  Demo, verbal and tactile cues throughout for technique. Nustep L5 x 6 min for warm-up and  At counter with bil UE support for safety: Heel/toe raises x 20 Hip extension 2 x 10 bil  Hip abduction 2 x 10 bil  Hip flexion 2 x 10 bil  Squats 2 x 10 Fwd steps over rolled green yoga mat 2x10, no UE support Rockerboard ant/post 2x20 reps -  progressed to holds into DF/PF 3x5 sec each way Seated R ankle DF with yellow TB X 20 R runner stretch x 30 sec 12/06/21 Therapeutic Exercises: Nu Step L5x610m Standing hip ABD, flex, ext x 10 red TB at counter Seated horizontal ABD with yellow TB 2x10 Seated anti rot going in circles with yellow TB x 12 each way  Neuromuscular Reeducation: Stepping over obstacles (foam rolls and yoga mats) 3x along counter Stepping onto obstacles (airex and balance disk) fwd and lateral 3x along counter Hip throws standing on airex with yellow TB x 10 Chest press standing on airex with feet together x 10      PATIENT EDUCATION:  Education details: HEP update - hip abd and ext with red TB  Person educated: Patient Education method: Explanation, Demonstration, and Handouts Education comprehension: verbalized understanding and returned demonstration   HOME EXERCISE PROGRAM: Access  Code: [email or web address]  ASSESSMENT:  CLINICAL IMPRESSION: Pt showed a good response to treatment. She was challenged with the SLS  (staggered stance) exercises more notably with R LE in front. She showed less stability with L horizontal head turn with gait. Added seated core stab to HEP per pt request. She would continue to benefit from balance and core stability exercises to reduce risk of falls.  GOALS: Goals reviewed with patient? Yes  SHORT TERM GOALS: Target date: 12/08/2021    Independent with initial HEP Baseline: Goal status: MET  2.  Complete ABC scale  Baseline:  Goal status: MET   LONG TERM GOALS: Target date: 01/05/2022    Ind with progresed HEP to improve outcomes.  Baseline:  Goal status: IN PROGRESS  2.  Be able to ascend/descend stairs with 1 HR and good eccentric control for safety.  Baseline:  Goal status: IN PROGRESS  3.  Be able to walk 15 minutes over even and uneven surfaces to return to work Baseline:  Goal status: IN PROGRESS  4.  Be able to maintain tandem stance x 30 sec bil   Baseline: 30 sec L, 10 sec R   11/29/21- maintained 30 sec bil  Goal status: MET  5.  Improve functional strength by improving 5x STS score to <14 sec to decrease risk of falls Baseline: 18.6 sec  Goal status: IN PROGRESS  6.  Improve DGI to >19/24 to decrease fall risk  Baseline: 16/24 Goal status: IN PROGRESS   PLAN: PT FREQUENCY: 2x/week  PT DURATION: 6 weeks  PLANNED INTERVENTIONS: Therapeutic exercises, Therapeutic activity, Neuromuscular re-education, Balance training, Gait training, Patient/Family education, Joint mobilization, Stair training, Cryotherapy, Moist heat, and Manual therapy  PLAN FOR NEXT SESSION: continue LE and balance exercises. steps for stair endurance.    Clarene Essex, PTA 12/20/2021, 5:59 PM

## 2021-12-21 ENCOUNTER — Ambulatory Visit (INDEPENDENT_AMBULATORY_CARE_PROVIDER_SITE_OTHER): Payer: Medicare Other | Admitting: Family

## 2021-12-21 ENCOUNTER — Encounter: Payer: Self-pay | Admitting: Family

## 2021-12-21 VITALS — BP 140/78 | HR 97 | Temp 97.7°F | Resp 17

## 2021-12-21 DIAGNOSIS — H1013 Acute atopic conjunctivitis, bilateral: Secondary | ICD-10-CM

## 2021-12-21 DIAGNOSIS — J3089 Other allergic rhinitis: Secondary | ICD-10-CM

## 2021-12-21 DIAGNOSIS — J329 Chronic sinusitis, unspecified: Secondary | ICD-10-CM

## 2021-12-21 DIAGNOSIS — J4540 Moderate persistent asthma, uncomplicated: Secondary | ICD-10-CM | POA: Diagnosis not present

## 2021-12-21 MED ORDER — LEVOCETIRIZINE DIHYDROCHLORIDE 5 MG PO TABS: 5.0000 mg | ORAL_TABLET | Freq: Every evening | ORAL | 5 refills | Status: DC

## 2021-12-21 MED ORDER — PULMICORT FLEXHALER 180 MCG/ACT IN AEPB
INHALATION_SPRAY | RESPIRATORY_TRACT | 5 refills | Status: DC
Start: 2021-12-21 — End: 2022-04-22

## 2021-12-21 MED ORDER — FLUTICASONE PROPIONATE 50 MCG/ACT NA SUSP: 2.0000 | Freq: Every day | NASAL | 5 refills | Status: DC

## 2021-12-21 MED ORDER — OLOPATADINE HCL 0.2 % OP SOLN: OPHTHALMIC | 5 refills | Status: DC

## 2021-12-21 MED ORDER — AZELASTINE HCL 0.1 % NA SOLN: NASAL | 5 refills | Status: DC

## 2021-12-21 NOTE — Patient Instructions (Addendum)
Mild persistent asthma (stopped Pulmicort due to cost in January 2023- has an appointment with pharmacist next week to discuss cost and options) Continue Pulmicort Flexhaler  180 mcg 2 puffs twice a day via nebulizer to help prevent cough and wheeze Continue montelukast (Singulair) 10 mg once a day to help prevent cough and wheeze May use albuterol 2 puffs every 4-6 hours as needed for cough, wheeze, tightness in chest, shortness of breath.  Also may use albuterol 2 puffs 5 to 15 minutes prior to exercise Continue to follow up with pulmonology due to your restrictive lung disease  Allergic rhinoconjunctivitis (2021 skin testing shows intradermal's mildly reactive to grass pollen, tree pollen, mold mix #4, and dust mite) Did not get  repeat immune lab work 4-6 weeks after pneumovax due to cost. (Had pneumovax 01/14/2021). No sinus infections since last appointment Start over the counter saline gel for nasal dryness Continue azelastine nasal spray 1-2 spray each nostril twice a day as needed for runny nose/stuffy nose/drainage down throat. Caution as this can be drying Continue fluticasone 1-2 sprays in each nostril once a day as needed for stuffy nose Stop Claritin (loratadine) Start Xyzal (levocetirizine) 5 mg once a day as needed for runny nose  May use sinus rinse as needed for nasal symptoms.  Use this prior to any medicated nasal sprays For thick postnasal drip may add on guaifenesin (Mucinex) 600 to 1200 mg twice a day as needed.  Make sure to drink plenty of fluids. May use Pataday 1 drop each eye once a day as needed for itchy watery eyes. Sample given  Reflux Continue omeprazole 40 mg once a day to help prevent cough and wheeze Schedule an appointment with your GI doctor to discuss   Please let us know if this treatment plan is not working well for you Schedule a follow-up appointment in 4 months or sooner if needed

## 2021-12-21 NOTE — Progress Notes (Signed)
[place] [id number] Dept: [phone]  FOLLOW UP NOTE  Patient ID: Monica Wallace, female    DOB: 1951/06/08  Age: 71 y.o. MRN: [id number] Date of Office Visit: 12/21/2021  Assessment  Chief Complaint: No chief complaint on file.  HPI Monica Wallace is a 71 year old female who presents today for follow-up of moderate persistent asthma, allergic rhinitis, allergic conjunctivitis, and recurrent sinus infections.  She reports since her last office visit she has not had any new diagnosis or surgeries since we last saw her.  She did mean to tell me at the last office visit that she had to have her finished basement mediated due to having mold.  The mold is now gone.  The basement was on a separate vent system.  She reports the last test checking for mold was good.  Mild persistent asthma: After our office visit her pharmacist was able to get Pulmicort Flexhaler 180 mcg for $57 and her next prescription should be $40. She is not using Pulmicort 0.5 mg via nebulizer. She reports cough due to a tickle in her throat and postnasal drip in the morning.  She denies shortness of breath, wheezing, tightness in chest, and nocturnal awakenings due to breathing problems.  Since her last office visit she has not required any systemic steroids or made any trips to the emergency room or urgent care due to breathing problems.  She reports rare use of albuterol.  Since her last office visit she did see a pulmonologist yesterday and is scheduled for a full pulmonary function test on August 24 or 25 at 9 AM and will see the physician afterwards at 10 AM.  Allergic rhinoconjunctivitis: She continues to use azelastine 1 spray each nostril twice a day, fluticasone 2 sprays each nostril once a day, and Claritin 10 mg once a day.  She does not use sinus rinse as much as she used to.  She reports itchy throat at times and drainage in the morning.  Drinking water or taking a cough drop helps.  She also mentions  rare rhinorrhea and sometimes her sinuses will feel dry and  have pressure, but not like prior to having sinus surgery with Dr. Benjamine Mola.  Discussed that we could try changing antihistamines to help with the tickle in her her throat and postnasal drip.  She has been on Claritin for a long time.    Allergic conjunctivitis: She uses Pataday eyedrops as needed.  She reports itchy watery eyes at times.She is about out of Pataday  Reflux is reported as moderately controlled with omeprazole 40 mg once a day.  She reports that she will have reflux symptoms if she eats too late, but this does not occur often.  She reports that she does feel like she has had a band around her stomach and has noticed this since January.  She has not spoken with her GI doctor about this, but plans on making an appointment to discuss this.  She does mention that she has IBS and is supposed to be taking probiotics and drinking 100 ounces of water a day.   Drug Allergies:  Allergies  Allergen Reactions   Levaquin [Levofloxacin] Shortness Of Breath   Penicillins Other (See Comments)    Positive on allergy testing 'years ago.' Has never had penicillin. Has tolerated amoxicillin.   Clindamycin/Lincomycin Other (See Comments)    Pruritus No rash   Codeine Itching   Gabapentin Itching    Review of Systems: Review  of Systems  Constitutional:  Negative for chills and fever.  HENT:         Reports nasal dryness, rare rhinorrhea, itchy throat at times, and postnasal drip.  She also has some sinus pressure, but nothing like prior to sinus surgery last year.  Eyes:        Reports itchy watery eyes for which she uses over-the-counter Pataday  Respiratory:  Positive for cough. Negative for shortness of breath and wheezing.        Reports cough due to itchy throat and in the morning postnasal drip.  Denies wheezing, tightness in her chest, shortness of breath, and nocturnal awakenings due to breathing problems  Cardiovascular:   Negative for chest pain and palpitations.  Gastrointestinal:        Reports reflux symptoms only if she eats too late and this is not that often.  Also reports that she feels a band around her stomach area.  She has noticed this since January.  She is not spoken with her GI doctor about this, but plans on scheduling an appointment  Genitourinary:  Negative for frequency.  Skin:  Positive for itching. Negative for rash.       Reports itchy area on her right shoulder that is worse at night and in the morning  Neurological:  Positive for headaches.       Reports sinus headaches at times  Endo/Heme/Allergies:  Positive for environmental allergies.    Physical Exam: BP 140/78   Pulse 97   Temp 97.7 F (36.5 C)   Resp 17   SpO2 98%    Physical Exam Constitutional:      Appearance: Normal appearance.  HENT:     Head: Normocephalic and atraumatic.     Comments: Pharynx normal, eyes normal, ears normal, nose: Bilateral lower turbinates moderately edematous and erythematous with no drainage noted    Right Ear: Tympanic membrane, ear canal and external ear normal.     Left Ear: Tympanic membrane, ear canal and external ear normal.  Eyes:     Conjunctiva/sclera: Conjunctivae normal.  Cardiovascular:     Rate and Rhythm: Regular rhythm. Tachycardia present.  Pulmonary:     Effort: Pulmonary effort is normal.     Breath sounds: Normal breath sounds.     Comments: Lungs clear to auscultation Musculoskeletal:     Cervical back: Neck supple.  Skin:    General: Skin is warm.     Comments: No rash or urticarial lesions noted  Neurological:     Mental Status: She is alert and oriented to person, place, and time.  Psychiatric:        Mood and Affect: Mood normal.        Behavior: Behavior normal.        Thought Content: Thought content normal.        Judgment: Judgment normal.    Diagnostics: FVC 1.70 L (77%), FEV1 1.25 L (73%).  Predicted FVC 2.20 L, predicted FEV1 1.72 L.  Spirometry  indicates normal respiratory function.  Assessment and Plan: 1. Moderate persistent asthma, unspecified whether complicated   2. Allergic rhinitis   3. Allergic conjunctivitis of both eyes   4. Recurrent sinus infections     Meds ordered this encounter  Medications   levocetirizine (XYZAL) 5 MG tablet    Sig: Take 1 tablet (5 mg total) by mouth every evening.    Dispense:  30 tablet    Refill:  5   Olopatadine HCl 0.2 % SOLN  Sig: Place 1 drop in each eye once a day as needed for itchy watery eyes    Dispense:  2.5 mL    Refill:  5    Please run as rx ndc not otc.   fluticasone (FLONASE) 50 MCG/ACT nasal spray    Sig: Place 2 sprays into both nostrils daily.    Dispense:  16 g    Refill:  5    Please keep rx on hold. Patient will call when needed.   budesonide (PULMICORT FLEXHALER) 180 MCG/ACT inhaler    Sig: 2 puffs twice daily to prevent coughing or wheezing.    Dispense:  1 each    Refill:  5    Please keep rx on hold. Patient will call when needed.   azelastine (ASTELIN) 0.1 % nasal spray    Sig: 1-2 sprays per nostril twice daily for runny nose    Dispense:  30 mL    Refill:  5    Please keep rx on hold. Patient will call when needed.    Patient Instructions  Mild persistent asthma (stopped Pulmicort due to cost in January 2023- has an appointment with pharmacist next week to discuss cost and options) Continue Pulmicort Flexhaler  180 mcg 2 puffs twice a day via nebulizer to help prevent cough and wheeze Continue montelukast (Singulair) 10 mg once a day to help prevent cough and wheeze May use albuterol 2 puffs every 4-6 hours as needed for cough, wheeze, tightness in chest, shortness of breath.  Also may use albuterol 2 puffs 5 to 15 minutes prior to exercise Continue to follow up with pulmonology due to your restrictive lung disease  Allergic rhinoconjunctivitis (2021 skin testing shows intradermal's mildly reactive to grass pollen, tree pollen, mold mix #4, and  dust mite) Did not get  repeat immune lab work 4-6 weeks after pneumovax due to cost. (Had pneumovax 01/14/2021). No sinus infections since last appointment Start over the counter saline gel for nasal dryness Continue azelastine nasal spray 1-2 spray each nostril twice a day as needed for runny nose/stuffy nose/drainage down throat. Caution as this can be drying Continue fluticasone 1-2 sprays in each nostril once a day as needed for stuffy nose Stop Claritin (loratadine) Start Xyzal (levocetirizine) 5 mg once a day as needed for runny nose  May use sinus rinse as needed for nasal symptoms.  Use this prior to any medicated nasal sprays For thick postnasal drip may add on guaifenesin (Mucinex) 600 to 1200 mg twice a day as needed.  Make sure to drink plenty of fluids. May use Pataday 1 drop each eye once a day as needed for itchy watery eyes. Sample given  Reflux Continue omeprazole 40 mg once a day to help prevent cough and wheeze Schedule an appointment with your GI doctor to discuss   Please let us know if this treatment plan is not working well for you Schedule a follow-up appointment in 4 months or sooner if needed Return in about 4 months (around 04/22/2022), or if symptoms worsen or fail to improve.    Thank you for the opportunity to care for this patient.  Please do not hesitate to contact me with questions.  Althea Charon, FNP [facility]

## 2021-12-23 ENCOUNTER — Other Ambulatory Visit: Payer: Self-pay | Admitting: Neurology

## 2021-12-23 ENCOUNTER — Ambulatory Visit: Payer: Medicare Other | Admitting: Physical Therapy

## 2021-12-23 ENCOUNTER — Telehealth: Payer: Self-pay | Admitting: Neurology

## 2021-12-23 ENCOUNTER — Encounter: Payer: Self-pay | Admitting: Physical Therapy

## 2021-12-23 ENCOUNTER — Encounter: Payer: Self-pay | Admitting: Neurology

## 2021-12-23 DIAGNOSIS — M5412 Radiculopathy, cervical region: Secondary | ICD-10-CM

## 2021-12-23 DIAGNOSIS — M6281 Muscle weakness (generalized): Secondary | ICD-10-CM | POA: Diagnosis not present

## 2021-12-23 DIAGNOSIS — R2681 Unsteadiness on feet: Secondary | ICD-10-CM | POA: Diagnosis not present

## 2021-12-23 DIAGNOSIS — R262 Difficulty in walking, not elsewhere classified: Secondary | ICD-10-CM | POA: Diagnosis not present

## 2021-12-23 NOTE — Therapy (Signed)
OUTPATIENT PHYSICAL THERAPY  Treatment Note Patient Name: Monica Wallace MRN: [id number] DOB:06-02-1951, 71 y.o., female Today's Date: 12/23/2021   PT End of Session - 12/23/21 1703     Visit Number 7    Number of Visits 12    Date for PT Re-Evaluation 01/05/22    Authorization Type Medicare & AARP    PT Start Time 1703    PT Stop Time 1742    PT Time Calculation (min) 39 min    Activity Tolerance Patient tolerated treatment well    Behavior During Therapy [facility] for tasks assessed/performed            Rationale for Evaluation and Treatment Rehabilitation     Past Medical History:  Diagnosis Date   Arthritis    Asthma    Baker's cyst of knee, right 02/16/2017   Cancer ([place]) 2002   thyroid (2002)   Cystic breast    Diabetes ([place])    GERD (gastroesophageal reflux disease)    Hepatic cyst 02/02/2015   Hyperlipidemia    Hypertension    Insomnia 07/23/2016   Menopause    Migraine    Palpitations    Preventative health care 01/18/2016   Sleep apnea 02/02/2015   Thyroid disease    Past Surgical History:  Procedure Laterality Date   ABDOMINAL HYSTERECTOMY     took right ovary and uterus   APPENDECTOMY     CARPAL TUNNEL RELEASE Right    CESAREAN SECTION     CHOLECYSTECTOMY     EYE SURGERY Bilateral 2017   cataracts   KNEE ARTHROPLASTY     SHOULDER ARTHROSCOPY W/ ROTATOR CUFF REPAIR Left    THYROIDECTOMY  2002   Patient Active Problem List   Diagnosis Date Noted   Unsteady gait 11/14/2021   Lumbar radiculopathy 09/09/2021   Cervical cancer screening 03/07/2021   Allergic conjunctivitis 04/08/2020   Neck pain 02/06/2020   Hip pain, right 09/30/2019   COVID-19 09/30/2019   Educated about COVID-19 virus infection 12/05/2018   Baker's cyst of knee, right 02/16/2017   Insomnia 07/23/2016   Hypokalemia 07/23/2016   Controlled type 2 diabetes mellitus without complication, without long-term current use of insulin ([place]) 01/24/2016   Preventative health care  01/18/2016   Left-sided low back pain with left-sided sciatica 10/07/2015   Obesity (BMI 30-39.9) 06/05/2015   OSA (obstructive sleep apnea) 02/02/2015   Palpitations 09/19/2014   Plantar fasciitis, bilateral 11/19/2013   Pronation deformity of ankle, acquired 11/19/2013   DJD (degenerative joint disease) 08/26/2013   Fibrocystic breast 08/26/2013   HTN (hypertension) 08/26/2013   GERD (gastroesophageal reflux disease) 08/26/2013   S/P hysterectomy with oophorectomy 08/26/2013   Hypothyroidism 08/26/2013   Chronic hoarseness 08/26/2013   Colon polyp 08/26/2013   Hiatal hernia 08/26/2013   Irritable bowel syndrome 08/26/2013   Moderate persistent asthma 08/26/2013   Allergic rhinoconjunctivitis 08/26/2013   Hyperlipidemia 08/22/2013   Migraine 08/22/2013   Thyroid cancer ([place]) 08/22/2013    PCP: [name] MD  REFERRING PROVIDER: Alric Ran, MD  REFERRING DIAG: R26.9 (ICD-10-CM) - Gait abnormality W19.XXXS (ICD-10-CM) - Fall, sequela  THERAPY DIAG:  Unsteadiness on feet  Muscle weakness (generalized)  Difficulty in walking, not elsewhere classified  ONSET DATE: ~1 month  SUBJECTIVE:   SUBJECTIVE STATEMENT: Patient reports she feels like her balance is improving and walking better.  She denies falls or LOB.  R knee a little more sore  PERTINENT HISTORY: asthma, hypertension, thyroid disease and diabetes mellitus type 2,  cervical radiculopathy, arthritis, history LBP  PAIN:  Are you having pain? 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     OBJECTIVE:   DIAGNOSTIC FINDINGS: MRI cervical spine was unchanged from prior and it showed moderate right-sided C3 thru C7 moderate foraminal stenosis, there were no spinal canal stenosis. MRI Lumbar spine 09/19/21 1. Moderate lower lumbar facet arthrosis, which may serve as a  source of local low back pain. 2. No spinal canal or neural foraminal stenosis. MRI Cervical spine 07/31/2021 1. Unchanged examination of the cervical spine with moderate right C3-4 and mild right C4-5 and left C6-7 neural foraminal stenosis. 2. No spinal canal stenosis   PATIENT SURVEYS:  ABC scale 67% (11/29/2021)   POSTURE:  Forward head posture  LE MMT:  MMT Right 11/24/2021 Left 11/24/2021  Hip flexion 4 5  Hip extension 4 4+  Hip abduction 5 5  Hip adduction 5 5  Knee flexion 5 5  Knee extension 5 5  Ankle dorsiflexion 5 5   (Blank rows = not tested)   FUNCTIONAL TESTS:  5 times sit to stand: 18.6 sec hands on thighs Tandem L foot forward 30 sec, R foot forward 10 sec  mCTSIB condition 1 30 sec, condition 2 30 sec, condition 3 30 sec, condition 4 30 sec but large sway.  DGI: 16/24 high risk for falls.   GAIT: Distance walked: 150  Assistive device utilized: None Level of assistance: Complete Independence Comments:  ModI on stairs with decreased eccentric control R quad descending.  Gait Speed: 0.5 m/s   TODAY'S TREATMENT: 12/23/21 Therapeutic Exercise: to improve strength and mobility.  Demo, verbal and tactile cues throughout for technique. NuStep L6 x 6 min Step ups x 10 bil (bottom step) Side step ups x 10 bil (R knee soreness)  Squats 2 x 10  Paloff press GTB x 10 seated, x 10 standing Suitcase carries 8lb 2 x 90' bil  Neuromuscular Reeducation: to improve balance and stability. SBA for safety throughout.    Star excursion pattern 180 deg forward to back 2 x 5 bil.    Balance board frontal plane x 20 followed by 1 min static hold, repeated sagittal plane.   12/20/21 Therapeutic Exercise: Nustep L6x23mn Hip throws (D2 flexion) bil with yellow weight ball 10x seated on airex and airex under feet Seated trunk rotation with GTB 15x R/L Seated pallof press with GTB 15x R/L  Neuromuscular Re-education: Staggered stance holding balance 1 trial EO:2nd trial  EC Bil staggered stance with ant/post WS 10x Gait with horizontal head turns 2x170 ft - decreased strides when looking to L Gait with vertical head turns 2x170 ft Retro gait 80 ft  12/15/21 Therapeutic Exercise: to improve strength and mobility.  Demo, verbal and tactile cues throughout for technique. Nustep L5 x 6 min for warm-up and  At counter with bil UE support for safety: Heel/toe raises x 20 Hip extension 2 x 10 bil  Hip abduction 2 x 10 bil  Hip flexion 2 x 10 bil  Squats 2 x 10 Fwd steps over rolled green yoga mat 2x10, no UE support Rockerboard ant/post 2x20 reps - progressed to holds into DF/PF 3x5 sec each way Seated R ankle DF with yellow TB X 20 R runner stretch x 30 sec   PATIENT EDUCATION:  Education details: continue HEP  Person educated:  Patient Education method: Explanation Education comprehension: verbalized understanding   HOME EXERCISE PROGRAM: Access Code: [email or web address]  ASSESSMENT:  CLINICAL IMPRESSION: Monica Wallace reports more confidence with balance and gait.  She did report more R knee soreness today, and difficulty with stairs.  Noted eccentric quad weakness bil., both on stairs and with star excursion pattern.  She would benefit from continued skilled therapy.  GOALS: Goals reviewed with patient? Yes  SHORT TERM GOALS: Target date: 12/08/2021    Independent with initial HEP Baseline: Goal status: MET  2.  Complete ABC scale  Baseline:  Goal status: MET   LONG TERM GOALS: Target date: 01/05/2022    Ind with progresed HEP to improve outcomes.  Baseline:  Goal status: IN PROGRESS  2.  Be able to ascend/descend stairs with 1 HR and good eccentric control for safety.  Baseline:  Goal status: IN PROGRESS  3.  Be able to walk 15 minutes over even and uneven surfaces to return to work Baseline:  Goal status: IN PROGRESS  4.  Be able to maintain tandem stance x 30 sec bil  Baseline: 30 sec L, 10 sec R   11/29/21- maintained 30 sec bil   Goal status: MET  5.  Improve functional strength by improving 5x STS score to <14 sec to decrease risk of falls Baseline: 18.6 sec  Goal status: IN PROGRESS  6.  Improve DGI to >19/24 to decrease fall risk  Baseline: 16/24 Goal status: IN PROGRESS   PLAN: PT FREQUENCY: 2x/week  PT DURATION: 6 weeks  PLANNED INTERVENTIONS: Therapeutic exercises, Therapeutic activity, Neuromuscular re-education, Balance training, Gait training, Patient/Family education, Joint mobilization, Stair training, Cryotherapy, Moist heat, and Manual therapy  PLAN FOR NEXT SESSION: continue LE and balance exercises. steps for stair endurance.    Rennie Natter, PT , DPT  12/23/2021, 5:46 PM

## 2021-12-23 NOTE — Telephone Encounter (Signed)
I reviewed the chart and found Dr. Nelva Bush notes/EMG result on Care Everywhere.  EMG from 11/08/21 summary:  Abnormal study.   Electrodiagnostic evidence of a chronic cervical radiculopathy, affecting the right C5 and C6 nerve roots. No acute denervation or axonal injury noted in the muscles supplied by the respective nerve roots.   No electrodiagnostic evidence of a right median motor or sensory entrapment neuropathy at the level of the right wrist.   No electrodiagnostic evidence of an ulnar motor entrapment neuropathy at the level of the right wrist or right elbow.   No electrodiagnostic evidence of a myopathy in the right upper extremity.   No electrodiagnostic evidence of a peripheral neuropathy in the right upper extremity.   Summary of office visit from 12/18/2021:  I understand her frustration. She will need to discuss the treatment plan with her neurologist. I told her that I do not recommend an injection for weakness only. I recommend injections for pain and she does not really have that. I would also recommend evaluation by Dr. Sherley Bounds. Follow-up with me as needed.

## 2021-12-23 NOTE — Telephone Encounter (Signed)
Referral for Neurosurgery sent to [facility] [phone]. ?

## 2021-12-23 NOTE — Progress Notes (Signed)
Referral to Neurosurgery for evaluation for cervical radiculopathy management sent   MRI cervical spine with moderate right C3-4 and mild right C4-5 and left C6-7 neural foraminal stenosis and EMG/NCS showing electrodiagnostic evidence of a chronic cervical radiculopathy, affecting the right C5 and C6 nerve roots.

## 2021-12-27 ENCOUNTER — Ambulatory Visit: Payer: Medicare Other

## 2021-12-27 DIAGNOSIS — M6281 Muscle weakness (generalized): Secondary | ICD-10-CM

## 2021-12-27 DIAGNOSIS — R262 Difficulty in walking, not elsewhere classified: Secondary | ICD-10-CM | POA: Diagnosis not present

## 2021-12-27 DIAGNOSIS — R2681 Unsteadiness on feet: Secondary | ICD-10-CM | POA: Diagnosis not present

## 2021-12-27 NOTE — Therapy (Signed)
OUTPATIENT PHYSICAL THERAPY  Treatment Note Patient Name: Monica Wallace MRN: [id number] DOB:Dec 21, 1950, 71 y.o., female Today's Date: 12/27/2021   PT End of Session - 12/27/21 1749     Visit Number 8    Number of Visits 12    Date for PT Re-Evaluation 01/05/22    Authorization Type Medicare & AARP    PT Start Time 1702    PT Stop Time 7829    PT Time Calculation (min) 44 min    Activity Tolerance Patient tolerated treatment well    Behavior During Therapy [facility] for tasks assessed/performed             Rationale for Evaluation and Treatment Rehabilitation     Past Medical History:  Diagnosis Date   Arthritis    Asthma    Baker's cyst of knee, right 02/16/2017   Cancer ([place]) 2002   thyroid (2002)   Cystic breast    Diabetes ([place])    GERD (gastroesophageal reflux disease)    Hepatic cyst 02/02/2015   Hyperlipidemia    Hypertension    Insomnia 07/23/2016   Menopause    Migraine    Palpitations    Preventative health care 01/18/2016   Sleep apnea 02/02/2015   Thyroid disease    Past Surgical History:  Procedure Laterality Date   ABDOMINAL HYSTERECTOMY     took right ovary and uterus   APPENDECTOMY     CARPAL TUNNEL RELEASE Right    CESAREAN SECTION     CHOLECYSTECTOMY     EYE SURGERY Bilateral 2017   cataracts   KNEE ARTHROPLASTY     SHOULDER ARTHROSCOPY W/ ROTATOR CUFF REPAIR Left    THYROIDECTOMY  2002   Patient Active Problem List   Diagnosis Date Noted   Unsteady gait 11/14/2021   Lumbar radiculopathy 09/09/2021   Cervical cancer screening 03/07/2021   Allergic conjunctivitis 04/08/2020   Neck pain 02/06/2020   Hip pain, right 09/30/2019   COVID-19 09/30/2019   Educated about COVID-19 virus infection 12/05/2018   Baker's cyst of knee, right 02/16/2017   Insomnia 07/23/2016   Hypokalemia 07/23/2016   Controlled type 2 diabetes mellitus without complication, without long-term current use of insulin ([place]) 01/24/2016   Preventative health care  01/18/2016   Left-sided low back pain with left-sided sciatica 10/07/2015   Obesity (BMI 30-39.9) 06/05/2015   OSA (obstructive sleep apnea) 02/02/2015   Palpitations 09/19/2014   Plantar fasciitis, bilateral 11/19/2013   Pronation deformity of ankle, acquired 11/19/2013   DJD (degenerative joint disease) 08/26/2013   Fibrocystic breast 08/26/2013   HTN (hypertension) 08/26/2013   GERD (gastroesophageal reflux disease) 08/26/2013   S/P hysterectomy with oophorectomy 08/26/2013   Hypothyroidism 08/26/2013   Chronic hoarseness 08/26/2013   Colon polyp 08/26/2013   Hiatal hernia 08/26/2013   Irritable bowel syndrome 08/26/2013   Moderate persistent asthma 08/26/2013   Allergic rhinoconjunctivitis 08/26/2013   Hyperlipidemia 08/22/2013   Migraine 08/22/2013   Thyroid cancer ([place]) 08/22/2013    PCP: [name] MD  REFERRING PROVIDER: Alric Ran, MD  REFERRING DIAG: R26.9 (ICD-10-CM) - Gait abnormality W19.XXXS (ICD-10-CM) - Fall, sequela  THERAPY DIAG:  Unsteadiness on feet  Muscle weakness (generalized)  Difficulty in walking, not elsewhere classified  ONSET DATE: ~1 month  SUBJECTIVE:   SUBJECTIVE STATEMENT: Pt reports sitting for a long time in church yesterday and her legs wanted to go numb  PERTINENT HISTORY: asthma, hypertension, thyroid disease and diabetes mellitus type 2, cervical radiculopathy, arthritis, history LBP  PAIN:  Are you having pain? 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     OBJECTIVE:   DIAGNOSTIC FINDINGS: MRI cervical spine was unchanged from prior and it showed moderate right-sided C3 thru C7 moderate foraminal stenosis, there were no spinal canal stenosis. MRI Lumbar spine 09/19/21 1. Moderate lower lumbar facet arthrosis, which may serve as a source of local low back pain. 2. No  spinal canal or neural foraminal stenosis. MRI Cervical spine 07/31/2021 1. Unchanged examination of the cervical spine with moderate right C3-4 and mild right C4-5 and left C6-7 neural foraminal stenosis. 2. No spinal canal stenosis   PATIENT SURVEYS:  ABC scale 67% (11/29/2021)   POSTURE:  Forward head posture  LE MMT:  MMT Right 11/24/2021 Left 11/24/2021  Hip flexion 4 5  Hip extension 4 4+  Hip abduction 5 5  Hip adduction 5 5  Knee flexion 5 5  Knee extension 5 5  Ankle dorsiflexion 5 5   (Blank rows = not tested)   FUNCTIONAL TESTS:  5 times sit to stand: 18.6 sec hands on thighs Tandem L foot forward 30 sec, R foot forward 10 sec  mCTSIB condition 1 30 sec, condition 2 30 sec, condition 3 30 sec, condition 4 30 sec but large sway.  DGI: 16/24 high risk for falls.   GAIT: Distance walked: 150  Assistive device utilized: None Level of assistance: Complete Independence Comments:  ModI on stairs with decreased eccentric control R quad descending.  Gait Speed: 0.5 m/s   TODAY'S TREATMENT: 12/27/21 TE: Bike L1x34mn Standing pallof press, trunk rot with blue TB 2x10 each R/L staggered SLS with bil arm raise with wall for support prn, 6 reps each, limited by R gluteal pain R/L piriformis stretches (KTOS and figure 4) 2x30 sec hold each  Functional Test: 5x STS - 12 sec no UE support   12/23/21 Therapeutic Exercise: to improve strength and mobility.  Demo, verbal and tactile cues throughout for technique. NuStep L6 x 6 min Step ups x 10 bil (bottom step) Side step ups x 10 bil (R knee soreness)  Squats 2 x 10  Paloff press GTB x 10 seated, x 10 standing Suitcase carries 8lb 2 x 90' bil  Neuromuscular Reeducation: to improve balance and stability. SBA for safety throughout.    Star excursion pattern 180 deg forward to back 2 x 5 bil.    Balance board frontal plane x 20 followed by 1 min static hold, repeated sagittal plane.   12/20/21 Therapeutic  Exercise: Nustep L6x642m Hip throws (D2 flexion) bil with yellow weight ball 10x seated on airex and airex under feet Seated trunk rotation with GTB 15x R/L Seated pallof press with GTB 15x R/L  Neuromuscular Re-education: Staggered stance holding balance 1 trial EO:2nd trial EC Bil staggered stance with ant/post WS 10x Gait with horizontal head turns 2x170 ft - decreased strides when looking to L Gait with vertical head turns 2x170 ft Retro gait 80 ft   PATIENT EDUCATION:  Education details: continue HEP  Person educated: Patient Education method: Explanation Education comprehension: verbalized understanding   HOME EXERCISE PROGRAM: Access Code: [email or web address]:  CLINICAL IMPRESSION: Pt has met LTG#5 today, see under today's treatment for specifics. She was limited with standing TE by R glute med pain, which she attributes from sitting in church yesterday for a  long time. Glute stretches were introduced to address the tightness and pain in her glutes. Cues with the pallof press to keep resistance in middle of chest. Cues required for upright posture and proper WS with staggered SLS exercises. GOALS: Goals reviewed with patient? Yes  SHORT TERM GOALS: Target date: 12/08/2021    Independent with initial HEP Baseline: Goal status: MET  2.  Complete ABC scale  Baseline:  Goal status: MET   LONG TERM GOALS: Target date: 01/05/2022    Ind with progresed HEP to improve outcomes.  Baseline:  Goal status: IN PROGRESS  2.  Be able to ascend/descend stairs with 1 HR and good eccentric control for safety.  Baseline:  Goal status: IN PROGRESS  3.  Be able to walk 15 minutes over even and uneven surfaces to return to work Baseline:  Goal status: IN PROGRESS  4.  Be able to maintain tandem stance x 30 sec bil  Baseline: 30 sec L, 10 sec R   11/29/21- maintained 30 sec bil  Goal status: MET  5.  Improve functional strength by improving 5x STS score to <14 sec to  decrease risk of falls Baseline: 18.6 sec  Goal status: MET (12/27/21)   6.  Improve DGI to >19/24 to decrease fall risk  Baseline: 16/24 Goal status: IN PROGRESS   PLAN: PT FREQUENCY: 2x/week  PT DURATION: 6 weeks  PLANNED INTERVENTIONS: Therapeutic exercises, Therapeutic activity, Neuromuscular re-education, Balance training, Gait training, Patient/Family education, Joint mobilization, Stair training, Cryotherapy, Moist heat, and Manual therapy  PLAN FOR NEXT SESSION: continue LE and balance exercises. steps for stair endurance.    Artist Pais, PTA  12/27/2021, 6:01 PM

## 2021-12-28 ENCOUNTER — Encounter: Payer: Self-pay | Admitting: Family Medicine

## 2021-12-28 ENCOUNTER — Telehealth: Payer: Medicare Other

## 2021-12-28 NOTE — Telephone Encounter (Signed)
I can write the return to work letter for [employer], does she need to see you before she goes back?

## 2021-12-30 ENCOUNTER — Ambulatory Visit: Payer: Medicare Other | Admitting: Physical Therapy

## 2021-12-30 ENCOUNTER — Encounter: Payer: Self-pay | Admitting: Physical Therapy

## 2021-12-30 DIAGNOSIS — M6281 Muscle weakness (generalized): Secondary | ICD-10-CM

## 2021-12-30 DIAGNOSIS — R2681 Unsteadiness on feet: Secondary | ICD-10-CM

## 2021-12-30 DIAGNOSIS — R262 Difficulty in walking, not elsewhere classified: Secondary | ICD-10-CM | POA: Diagnosis not present

## 2021-12-30 NOTE — Therapy (Signed)
OUTPATIENT PHYSICAL THERAPY  Treatment Note/Progress Note   Progress Note Reporting Period 11/24/2021 to 12/30/2021  See note below for Objective Data and Assessment of Progress/Goals.     Patient Name: CARIS CERVENY MRN: [id number] DOB:Sep 12, 1950, 71 y.o., female Today's Date: 12/30/2021   PT End of Session - 12/30/21 1706     Visit Number 9    Number of Visits 12    Date for PT Re-Evaluation 02/10/22    Authorization Type Medicare & AARP    Progress Note Due on Visit 79    PT Start Time 1703    PT Stop Time 1744    PT Time Calculation (min) 41 min    Activity Tolerance Patient tolerated treatment well    Behavior During Therapy [facility] for tasks assessed/performed             Rationale for Evaluation and Treatment Rehabilitation     Past Medical History:  Diagnosis Date   Arthritis    Asthma    Baker's cyst of knee, right 02/16/2017   Cancer ([place]) 2002   thyroid (2002)   Cystic breast    Diabetes ([place])    GERD (gastroesophageal reflux disease)    Hepatic cyst 02/02/2015   Hyperlipidemia    Hypertension    Insomnia 07/23/2016   Menopause    Migraine    Palpitations    Preventative health care 01/18/2016   Sleep apnea 02/02/2015   Thyroid disease    Past Surgical History:  Procedure Laterality Date   ABDOMINAL HYSTERECTOMY     took right ovary and uterus   APPENDECTOMY     CARPAL TUNNEL RELEASE Right    CESAREAN SECTION     CHOLECYSTECTOMY     EYE SURGERY Bilateral 2017   cataracts   KNEE ARTHROPLASTY     SHOULDER ARTHROSCOPY W/ ROTATOR CUFF REPAIR Left    THYROIDECTOMY  2002   Patient Active Problem List   Diagnosis Date Noted   Unsteady gait 11/14/2021   Lumbar radiculopathy 09/09/2021   Cervical cancer screening 03/07/2021   Allergic conjunctivitis 04/08/2020   Neck pain 02/06/2020   Hip pain, right 09/30/2019   COVID-19 09/30/2019   Educated about COVID-19 virus infection 12/05/2018   Baker's cyst of knee, right 02/16/2017   Insomnia  07/23/2016   Hypokalemia 07/23/2016   Controlled type 2 diabetes mellitus without complication, without long-term current use of insulin ([place]) 01/24/2016   Preventative health care 01/18/2016   Left-sided low back pain with left-sided sciatica 10/07/2015   Obesity (BMI 30-39.9) 06/05/2015   OSA (obstructive sleep apnea) 02/02/2015   Palpitations 09/19/2014   Plantar fasciitis, bilateral 11/19/2013   Pronation deformity of ankle, acquired 11/19/2013   DJD (degenerative joint disease) 08/26/2013   Fibrocystic breast 08/26/2013   HTN (hypertension) 08/26/2013   GERD (gastroesophageal reflux disease) 08/26/2013   S/P hysterectomy with oophorectomy 08/26/2013   Hypothyroidism 08/26/2013   Chronic hoarseness 08/26/2013   Colon polyp 08/26/2013   Hiatal hernia 08/26/2013   Irritable bowel syndrome 08/26/2013   Moderate persistent asthma 08/26/2013   Allergic rhinoconjunctivitis 08/26/2013   Hyperlipidemia 08/22/2013   Migraine 08/22/2013   Thyroid cancer ([place]) 08/22/2013    PCP: [name] MD  REFERRING PROVIDER: Alric Ran, MD  REFERRING DIAG: R26.9 (ICD-10-CM) - Gait abnormality W19.XXXS (ICD-10-CM) - Fall, sequela  THERAPY DIAG:  Unsteadiness on feet  Muscle weakness (generalized)  Difficulty in walking, not elsewhere classified  ONSET DATE: ~1 month  SUBJECTIVE:   SUBJECTIVE STATEMENT: Pt reports  having some L heel pain, has history of L achilles tendonitis.  Feels therapy is helping her get strong, would like to continue if possible.  She returns to work on Monday.   PERTINENT HISTORY: asthma, hypertension, thyroid disease and diabetes mellitus type 2, cervical radiculopathy, arthritis, history LBP  PAIN:  Are you having pain? 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     OBJECTIVE:   DIAGNOSTIC FINDINGS: MRI  cervical spine was unchanged from prior and it showed moderate right-sided C3 thru C7 moderate foraminal stenosis, there were no spinal canal stenosis. MRI Lumbar spine 09/19/21 1. Moderate lower lumbar facet arthrosis, which may serve as a source of local low back pain. 2. No spinal canal or neural foraminal stenosis. MRI Cervical spine 07/31/2021 1. Unchanged examination of the cervical spine with moderate right C3-4 and mild right C4-5 and left C6-7 neural foraminal stenosis. 2. No spinal canal stenosis   PATIENT SURVEYS:  ABC scale 67% (11/29/2021)   POSTURE:  Forward head posture  LE MMT:  MMT Right 11/24/2021 Left 11/24/2021  Hip flexion 4 5  Hip extension 4 4+  Hip abduction 5 5  Hip adduction 5 5  Knee flexion 5 5  Knee extension 5 5  Ankle dorsiflexion 5 5   (Blank rows = not tested)   FUNCTIONAL TESTS:  5 times sit to stand: 18.6 sec hands on thighs Tandem L foot forward 30 sec, R foot forward 10 sec  mCTSIB condition 1 30 sec, condition 2 30 sec, condition 3 30 sec, condition 4 30 sec but large sway.  DGI: 16/24 high risk for falls.    12/30/2021 DGI 21/24  GAIT: Distance walked: 150  Assistive device utilized: None Level of assistance: Complete Independence Comments:  ModI on stairs with decreased eccentric control R quad descending.  Gait Speed: 0.5 m/s 12/30/2021 - 600' before needed to stop due to R hip pain   TODAY'S TREATMENT: 12/30/2021 Therapeutic Exercise: to improve strength and mobility.  Demo, verbal and tactile cues throughout for technique. Bike L 1 x 6 min  Piriformis stretches seated - cross knee pushing down, KTOS, and forward flexion.  Physical Performance: 6MTW -600', DGI 21/24, assessment stairs Manual Therapy: to decrease muscle spasm and pain and improve mobility STM/TPR to R glut med and piriformis, skilled palpation and monitoring dry needling.  Trigger Point Dry-Needling  Treatment instructions: Expect mild to moderate muscle soreness.  S/S of pneumothorax if dry needled over a lung field, and to seek immediate medical attention should they occur. Patient verbalized understanding of these instructions and education.  Patient Consent Given: Yes Education handout provided: Previously provided Muscles treated: R glut med, R piriformis Electrical stimulation performed: No Parameters: N/A Treatment response/outcome: Twitch Response Elicited and Palpable Increase in Muscle Length  12/27/21 TE: Bike L1x47mn Standing pallof press, trunk rot with blue TB 2x10 each R/L staggered SLS with bil arm raise with wall for support prn, 6 reps each, limited by R gluteal pain R/L piriformis stretches (KTOS and figure 4) 2x30 sec hold each  Functional Test: 5x STS - 12 sec no UE support   12/23/21 Therapeutic Exercise: to improve strength and mobility.  Demo, verbal and tactile cues throughout for technique. NuStep L6 x 6 min Step ups x 10 bil (bottom step) Side step ups x 10 bil (R knee soreness)  Squats 2 x 10  Paloff press GTB x 10 seated, x 10 standing Suitcase carries 8lb 2 x 90' bil  Neuromuscular Reeducation: to improve balance and stability. SBA for safety throughout.    Star excursion pattern 180 deg forward to back 2 x 5 bil.    Balance board frontal plane x 20 followed by 1 min static hold, repeated sagittal plane.   PATIENT EDUCATION:  Education details: education on risks and benefits of dry needling  Person educated: Patient Education method: Explanation Education comprehension: verbalized understanding   HOME EXERCISE PROGRAM: Access Code: [email or web address]  ASSESSMENT:  CLINICAL IMPRESSION: Patient is making good progress towards goals, improving functional strength and balance, today scored 21/24 on DGI demonstrating lowered risk of falls.  She still had difficulty on stairs, noted today catching foot on stairs 2x as fatigued (completed 2 flights) and using moderate assist with UE on HR to ascend, and alternating between  step to and reciprocal gait descending, as still has R quad weakness.  She was limited today with walking due to R glute med/piriformis pain, stopping after 600'.  Noted significant tightness in R glut med and piriformis with palpation, after TrDN and manual therapy patient reported decreased tightness and able to perform stretches with less effort.  She would benefit from continued skilled therapy to continue to improve balance, strength, endurance and activity tolerance in order to be able to return to work safely without imitation.  Extending POC for additional 6 weeks.   GOALS: Goals reviewed with patient? Yes  SHORT TERM GOALS: Target date: 12/08/2021    Independent with initial HEP Baseline: Goal status: MET  2.  Complete ABC scale  Baseline:  Goal status: MET   LONG TERM GOALS: Target date: 01/05/2022    Ind with progresed HEP to improve outcomes.  Baseline:  Goal status: IN PROGRESS 12/30/21- met for current  2.  Be able to ascend/descend stairs with 1 HR and good eccentric control for safety.  Baseline:  Goal status: IN PROGRESS 12/30/21- caught foot 2x, pulling up slightly ascending, decreased eccentric control R quad descending   3.  Be able to walk 15 minutes over even and uneven surfaces to return to work Baseline:  Goal status: IN PROGRESS 12/30/21- increased hip pain after 5 min walking  4.  Be able to maintain tandem stance x 30 sec bil  Baseline: 30 sec L, 10 sec R   11/29/21- maintained 30 sec bil  Goal status: MET  5.  Improve functional strength by improving 5x STS score to <14 sec to decrease risk of falls Baseline: 18.6 sec  Goal status: MET (12/27/21)   6.  Improve DGI to >19/24 to decrease fall risk  Baseline: 16/24 Goal status: MET 12/30/21- 21/24   PLAN: PT FREQUENCY: 1-2x/week  PT DURATION: 6 weeks  PLANNED INTERVENTIONS: Therapeutic exercises, Therapeutic activity, Neuromuscular re-education, Balance training, Gait training, Patient/Family  education, Joint mobilization, Stair training, Dry Needling, Cryotherapy, Moist heat, and Manual therapy  PLAN FOR NEXT SESSION: continue LE and balance exercises. steps for stair endurance.    Rennie Natter, PT , DPT  12/30/2021, 6:12 PM

## 2021-12-31 ENCOUNTER — Telehealth: Payer: Medicare Other

## 2021-12-31 NOTE — Addendum Note (Signed)
Addended by: Rennie Natter on: 12/31/2021 10:35 AM   Modules accepted: Orders

## 2022-01-03 ENCOUNTER — Ambulatory Visit: Payer: Medicare Other

## 2022-01-03 DIAGNOSIS — R262 Difficulty in walking, not elsewhere classified: Secondary | ICD-10-CM

## 2022-01-03 DIAGNOSIS — R2681 Unsteadiness on feet: Secondary | ICD-10-CM

## 2022-01-03 DIAGNOSIS — M6281 Muscle weakness (generalized): Secondary | ICD-10-CM | POA: Diagnosis not present

## 2022-01-03 NOTE — Therapy (Signed)
OUTPATIENT PHYSICAL THERAPY  Treatment Note       Patient Name: BRITTNAY PIGMAN MRN: [id number] DOB:04-Jun-1951, 71 y.o., female Today's Date: 01/03/2022   PT End of Session - 01/03/22 1753     Visit Number 10    Number of Visits 12    Date for PT Re-Evaluation 02/10/22    Authorization Type Medicare & AARP    Progress Note Due on Visit 34    PT Start Time 1702    PT Stop Time 1746    PT Time Calculation (min) 44 min    Activity Tolerance Patient tolerated treatment well    Behavior During Therapy [facility] for tasks assessed/performed              Rationale for Evaluation and Treatment Rehabilitation     Past Medical History:  Diagnosis Date   Arthritis    Asthma    Baker's cyst of knee, right 02/16/2017   Cancer ([place]) 2002   thyroid (2002)   Cystic breast    Diabetes ([place])    GERD (gastroesophageal reflux disease)    Hepatic cyst 02/02/2015   Hyperlipidemia    Hypertension    Insomnia 07/23/2016   Menopause    Migraine    Palpitations    Preventative health care 01/18/2016   Sleep apnea 02/02/2015   Thyroid disease    Past Surgical History:  Procedure Laterality Date   ABDOMINAL HYSTERECTOMY     took right ovary and uterus   APPENDECTOMY     CARPAL TUNNEL RELEASE Right    CESAREAN SECTION     CHOLECYSTECTOMY     EYE SURGERY Bilateral 2017   cataracts   KNEE ARTHROPLASTY     SHOULDER ARTHROSCOPY W/ ROTATOR CUFF REPAIR Left    THYROIDECTOMY  2002   Patient Active Problem List   Diagnosis Date Noted   Unsteady gait 11/14/2021   Lumbar radiculopathy 09/09/2021   Cervical cancer screening 03/07/2021   Allergic conjunctivitis 04/08/2020   Neck pain 02/06/2020   Hip pain, right 09/30/2019   COVID-19 09/30/2019   Educated about COVID-19 virus infection 12/05/2018   Baker's cyst of knee, right 02/16/2017   Insomnia 07/23/2016   Hypokalemia 07/23/2016   Controlled type 2 diabetes mellitus without complication, without long-term current use of insulin  ([place]) 01/24/2016   Preventative health care 01/18/2016   Left-sided low back pain with left-sided sciatica 10/07/2015   Obesity (BMI 30-39.9) 06/05/2015   OSA (obstructive sleep apnea) 02/02/2015   Palpitations 09/19/2014   Plantar fasciitis, bilateral 11/19/2013   Pronation deformity of ankle, acquired 11/19/2013   DJD (degenerative joint disease) 08/26/2013   Fibrocystic breast 08/26/2013   HTN (hypertension) 08/26/2013   GERD (gastroesophageal reflux disease) 08/26/2013   S/P hysterectomy with oophorectomy 08/26/2013   Hypothyroidism 08/26/2013   Chronic hoarseness 08/26/2013   Colon polyp 08/26/2013   Hiatal hernia 08/26/2013   Irritable bowel syndrome 08/26/2013   Moderate persistent asthma 08/26/2013   Allergic rhinoconjunctivitis 08/26/2013   Hyperlipidemia 08/22/2013   Migraine 08/22/2013   Thyroid cancer ([place]) 08/22/2013    PCP: [name] MD  REFERRING PROVIDER: Alric Ran, MD  REFERRING DIAG: R26.9 (ICD-10-CM) - Gait abnormality W19.XXXS (ICD-10-CM) - Fall, sequela  THERAPY DIAG:  Unsteadiness on feet  Muscle weakness (generalized)  Difficulty in walking, not elsewhere classified  ONSET DATE: ~1 month  SUBJECTIVE:   SUBJECTIVE STATEMENT: Pt reports she usually gets groceries delivered but Saturday went to grocery to walk around and get what she needed,  she was very sore yesterday. PERTINENT HISTORY: asthma, hypertension, thyroid disease and diabetes mellitus type 2, cervical radiculopathy, arthritis, history LBP  PAIN:  Are you having pain? 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     OBJECTIVE:   DIAGNOSTIC FINDINGS: MRI cervical spine was unchanged from prior and it showed moderate right-sided C3 thru C7 moderate foraminal stenosis, there were no spinal canal stenosis. MRI Lumbar spine  09/19/21 1. Moderate lower lumbar facet arthrosis, which may serve as a source of local low back pain. 2. No spinal canal or neural foraminal stenosis. MRI Cervical spine 07/31/2021 1. Unchanged examination of the cervical spine with moderate right C3-4 and mild right C4-5 and left C6-7 neural foraminal stenosis. 2. No spinal canal stenosis   PATIENT SURVEYS:  ABC scale 67% (11/29/2021)   POSTURE:  Forward head posture  LE MMT:  MMT Right 11/24/2021 Left 11/24/2021  Hip flexion 4 5  Hip extension 4 4+  Hip abduction 5 5  Hip adduction 5 5  Knee flexion 5 5  Knee extension 5 5  Ankle dorsiflexion 5 5   (Blank rows = not tested)   FUNCTIONAL TESTS:  5 times sit to stand: 18.6 sec hands on thighs Tandem L foot forward 30 sec, R foot forward 10 sec  mCTSIB condition 1 30 sec, condition 2 30 sec, condition 3 30 sec, condition 4 30 sec but large sway.  DGI: 16/24 high risk for falls.    12/30/2021 DGI 21/24  GAIT: Distance walked: 150  Assistive device utilized: None Level of assistance: Complete Independence Comments:  ModI on stairs with decreased eccentric control R quad descending.  Gait Speed: 0.5 m/s 12/30/2021 - 600' before needed to stop due to R hip pain   TODAY'S TREATMENT: 01/03/22 Therapeutic Exercise: Bike L2x44mn Step down 4' 2x10 bil - only able to do 8 reps on last set with L leg Lateral step down from 6' step 2x10- cues not to tilt pelvis Standing hip flexion 10x3 sec Standing hip abduction 10x3 sec Bird dog in standing 10x bil  Manual Therapy: IASTM to distal gastroc into achilles tendon and achilles insertion with s/s tool PROM into dorsiflexion with knee extended to stretch achilles   12/30/2021 Therapeutic Exercise: to improve strength and mobility.  Demo, verbal and tactile cues throughout for technique. Bike L 1 x 6 min  Piriformis stretches seated - cross knee pushing down, KTOS, and forward flexion.  Physical Performance: 6MTW -600', DGI 21/24,  assessment stairs Manual Therapy: to decrease muscle spasm and pain and improve mobility STM/TPR to R glut med and piriformis, skilled palpation and monitoring dry needling.  Trigger Point Dry-Needling  Treatment instructions: Expect mild to moderate muscle soreness. S/S of pneumothorax if dry needled over a lung field, and to seek immediate medical attention should they occur. Patient verbalized understanding of these instructions and education.  Patient Consent Given: Yes Education handout provided: Previously provided Muscles treated: R glut med, R piriformis Electrical stimulation performed: No Parameters: N/A Treatment response/outcome: Twitch Response Elicited and Palpable Increase in Muscle Length  12/27/21 TE: Bike L1x849m Standing pallof press, trunk rot with blue TB 2x10 each R/L staggered SLS with bil arm raise with wall for support prn, 6 reps each, limited by R gluteal pain R/L piriformis stretches (KTOS and figure 4) 2x30 sec hold each  Functional  Test: 5x STS - 12 sec no UE support   12/23/21 Therapeutic Exercise: to improve strength and mobility.  Demo, verbal and tactile cues throughout for technique. NuStep L6 x 6 min Step ups x 10 bil (bottom step) Side step ups x 10 bil (R knee soreness)  Squats 2 x 10  Paloff press GTB x 10 seated, x 10 standing Suitcase carries 8lb 2 x 90' bil  Neuromuscular Reeducation: to improve balance and stability. SBA for safety throughout.    Star excursion pattern 180 deg forward to back 2 x 5 bil.    Balance board frontal plane x 20 followed by 1 min static hold, repeated sagittal plane.   PATIENT EDUCATION:  Education details: education on risks and benefits of dry needling  Person educated: Patient Education method: Explanation Education comprehension: verbalized understanding   HOME EXERCISE PROGRAM: Access Code: [email or web address]  ASSESSMENT:  CLINICAL IMPRESSION:  Henritta noted that her biggest concern going forward is steps more  so going down. We focused on single leg exercises and step downs today to facilitate more muscle activation required for step down along with promoting proper mechanics of hips/knees. She had c/o L achilles tendon pain which has been inhibiting her from walking w/o pain so finished with MT to lower leg to help improve gait mechanics.  GOALS: Goals reviewed with patient? Yes  SHORT TERM GOALS: Target date: 12/08/2021    Independent with initial HEP Baseline: Goal status: MET  2.  Complete ABC scale  Baseline:  Goal status: MET   LONG TERM GOALS: Target date: 01/05/2022    Ind with progresed HEP to improve outcomes.  Baseline:  Goal status: IN PROGRESS 12/30/21- met for current  2.  Be able to ascend/descend stairs with 1 HR and good eccentric control for safety.  Baseline:  Goal status: IN PROGRESS 12/30/21- caught foot 2x, pulling up slightly ascending, decreased eccentric control R quad descending   3.  Be able to walk 15 minutes over even and uneven surfaces to return to work Baseline:  Goal status: IN PROGRESS 12/30/21- increased hip pain after 5 min walking  4.  Be able to maintain tandem stance x 30 sec bil  Baseline: 30 sec L, 10 sec R   11/29/21- maintained 30 sec bil  Goal status: MET  5.  Improve functional strength by improving 5x STS score to <14 sec to decrease risk of falls Baseline: 18.6 sec  Goal status: MET (12/27/21)   6.  Improve DGI to >19/24 to decrease fall risk  Baseline: 16/24 Goal status: MET 12/30/21- 21/24   PLAN: PT FREQUENCY: 1-2x/week  PT DURATION: 6 weeks  PLANNED INTERVENTIONS: Therapeutic exercises, Therapeutic activity, Neuromuscular re-education, Balance training, Gait training, Patient/Family education, Joint mobilization, Stair training, Dry Needling, Cryotherapy, Moist heat, and Manual therapy  PLAN FOR NEXT SESSION: continue LE and balance exercises. steps for stair endurance.    Artist Pais, PTA  01/03/2022, 6:02 PM

## 2022-01-03 NOTE — Telephone Encounter (Signed)
Pt scheduled on 01/12/2022 @ 3:45 with Dr. Reatha Armour at [facility].

## 2022-01-04 ENCOUNTER — Ambulatory Visit (INDEPENDENT_AMBULATORY_CARE_PROVIDER_SITE_OTHER): Payer: Medicare Other

## 2022-01-04 DIAGNOSIS — I10 Essential (primary) hypertension: Secondary | ICD-10-CM

## 2022-01-04 DIAGNOSIS — E782 Mixed hyperlipidemia: Secondary | ICD-10-CM

## 2022-01-04 NOTE — Patient Instructions (Signed)
Visit Information  Thank you for taking time to visit with me today. Please don't hesitate to contact me if I can be of assistance to you before our next scheduled telephone appointment.  Following are the goals we discussed today:  Patient Goals/Self-Care Activities: Contact provider for concerns regarding fluid retention and increased weight gain check blood pressure daily routinely and notify provider if outside recommended parameters keep all doctor appointments take medications as prescribed and contact clinical pharmacist if unable to obtain medications attend rehab therapy sessions and perform exercise per therapist recommendation eat more whole grains, fruits and vegetables, lean meats and healthy fats  [place] will contact you to schedule next appointment.   Please call the care guide team at [phone] if you need to cancel or reschedule your appointment.   If you are experiencing a Mental Health or [place] or need someone to talk to, please call the Suicide and Crisis Lifeline: 988 call 1-800-273-TALK (toll free, 24 hour hotline)   Patient verbalizes understanding of instructions and care plan provided today and agrees to view in [place]. Active MyChart status and patient understanding of how to access instructions and care plan via MyChart confirmed with patient.     Thea Silversmith, RN, MSN, BSN, CCM Care Management Coordinator [facility] [phone]

## 2022-01-04 NOTE — Chronic Care Management (AMB) (Signed)
Chronic Care Management   CCM RN Visit Note  01/04/2022 Name: Monica Wallace MRN: [id number] DOB: [birth year]  Subjective: Monica Wallace is a 71 y.o. year old female who is a primary care patient of [name], MD. The care management team was consulted for assistance with disease management and care coordination needs.    Engaged with patient by telephone for follow up visit in response to provider referral for case management and/or care coordination services.   Consent to Services:  The patient was given information about Chronic Care Management services, agreed to services, and gave verbal consent prior to initiation of services.  Please see initial visit note for detailed documentation.   Patient agreed to services and verbal consent obtained.   Assessment: Review of patient past medical history, allergies, medications, health status, including review of consultants reports, laboratory and other test data, was performed as part of comprehensive evaluation and provision of chronic care management services.   SDOH (Social Determinants of Health) assessments and interventions performed:    CCM Care Plan  Allergies  Allergen Reactions   Levaquin [Levofloxacin] Shortness Of Breath   Penicillins Other (See Comments)    Positive on allergy testing 'years ago.' Has never had penicillin. Has tolerated amoxicillin.   Clindamycin/Lincomycin Other (See Comments)    Pruritus No rash   Codeine Itching   Gabapentin Itching    Outpatient Encounter Medications as of 01/04/2022  Medication Sig Note   acetaminophen (TYLENOL) 325 MG tablet Take 650 mg by mouth every 6 (six) hours as needed.    albuterol (PROVENTIL) (2.5 MG/3ML) 0.083% nebulizer solution Take 3 mLs (2.5 mg total) by nebulization every 6 (six) hours as needed for wheezing or shortness of breath.    albuterol (VENTOLIN HFA) 108 (90 Base) MCG/ACT inhaler INHALE 2 PUFFS INTO THE LUNGS EVERY 6 HOURS AS NEEDED FOR WHEEZING OR  SHORTNESS OF BREATH    azelastine (ASTELIN) 0.1 % nasal spray Place 1 spray in each nostril twice a day as needed for runny nose/drainage down throat    azelastine (ASTELIN) 0.1 % nasal spray 1-2 sprays per nostril twice daily for runny nose    Blood Glucose Monitoring Suppl (ONETOUCH VERIO FLEX SYSTEM) w/Device KIT Use to check blood glucose once a day.  DX code: E11.9    budesonide (PULMICORT FLEXHALER) 180 MCG/ACT inhaler 2 puffs twice daily to prevent coughing or wheezing.    Cholecalciferol ([employer] VIT D 5000 HIGH-POTENCY PO) Take 5,000 Units by mouth daily.    estradiol (ESTRACE VAGINAL) 0.1 MG/GM vaginal cream Place 1 Applicatorful vaginally at bedtime.    fluticasone (FLONASE) 50 MCG/ACT nasal spray SHAKE LIQUID AND USE 2 SPRAYS IN EACH NOSTRIL DAILY    fluticasone (FLONASE) 50 MCG/ACT nasal spray Place 2 sprays into both nostrils daily.    glucose blood (ONETOUCH VERIO) test strip TEST BLOOD GLUCOSE ONCE A DAY    hyoscyamine (LEVSIN SL) 0.125 MG SL tablet Place 1 tablet (0.125 mg total) under the tongue every 4 (four) hours as needed.    Lancets (ONETOUCH DELICA PLUS [id number]) MISC TEST BLOOD SUGAR ONCE A DAY    levocetirizine (XYZAL) 5 MG tablet Take 1 tablet (5 mg total) by mouth every evening.    levothyroxine (SYNTHROID) 137 MCG tablet Take 1 tablet (137 mcg total) by mouth daily before breakfast. NAME BRAND ONLY 11/23/2021: Hasn't started new dose yet   montelukast (SINGULAIR) 10 MG tablet TAKE 1 TABLET(10 MG) BY MOUTH DAILY AS NEEDED (Patient taking differently: Take  10 mg by mouth daily. TAKE 1 TABLET(10 MG) BY MOUTH DAILY AS NEEDED) 06/03/2021: Reports takes every night   Olopatadine HCl 0.2 % SOLN Place 1 drop in each eye once a day as needed for itchy watery eyes    Omega-3 1000 MG CAPS Take 1,000 mg by mouth daily.    omeprazole (PRILOSEC) 40 MG capsule Take 1 capsule by mouth daily.    potassium chloride SA (KLOR-CON M) 20 MEQ tablet TAKE 2 TABLETS BY MOUTH DAILY FOR 3 DAYS. THEN  TAKE 1 TABLET BY MOUTH DAILY.    rosuvastatin (CRESTOR) 5 MG tablet TAKE 1 TAKE ONE TABLET EVERY__ HOURS TAKE ONE TABLET EVERY__ HOURS TAB BY MOUTH 2 TIMES A WEEK(TUE AND SUN WEEKLY)    triamterene-hydrochlorothiazide (MAXZIDE-25) 37.5-25 MG tablet Take 1 tablet by mouth daily.    No facility-administered encounter medications on file as of 01/04/2022.    Patient Active Problem List   Diagnosis Date Noted   Unsteady gait 11/14/2021   Lumbar radiculopathy 09/09/2021   Cervical cancer screening 03/07/2021   Allergic conjunctivitis 04/08/2020   Neck pain 02/06/2020   Hip pain, right 09/30/2019   COVID-19 09/30/2019   Educated about COVID-19 virus infection 12/05/2018   Baker's cyst of knee, right 02/16/2017   Insomnia 07/23/2016   Hypokalemia 07/23/2016   Controlled type 2 diabetes mellitus without complication, without long-term current use of insulin ([place]) 01/24/2016   Preventative health care 01/18/2016   Left-sided low back pain with left-sided sciatica 10/07/2015   Obesity (BMI 30-39.9) 06/05/2015   OSA (obstructive sleep apnea) 02/02/2015   Palpitations 09/19/2014   Plantar fasciitis, bilateral 11/19/2013   Pronation deformity of ankle, acquired 11/19/2013   DJD (degenerative joint disease) 08/26/2013   Fibrocystic breast 08/26/2013   HTN (hypertension) 08/26/2013   GERD (gastroesophageal reflux disease) 08/26/2013   S/P hysterectomy with oophorectomy 08/26/2013   Hypothyroidism 08/26/2013   Chronic hoarseness 08/26/2013   Colon polyp 08/26/2013   Hiatal hernia 08/26/2013   Irritable bowel syndrome 08/26/2013   Moderate persistent asthma 08/26/2013   Allergic rhinoconjunctivitis 08/26/2013   Hyperlipidemia 08/22/2013   Migraine 08/22/2013   Thyroid cancer ([place]) 08/22/2013    Conditions to be addressed/monitored:HTN, HLD, and Asthma  Care Plan : RN Care Manager Plan of Care  Updates made by Luretha Rued, RN since 01/04/2022 12:00 AM     Problem: Chronic Disease  Managment education and/or Care Coordination Needs(HTN,HLD)   Priority: High     Long-Range Goal: Development of plan of care for Chronic Disease Managment (HTN/HLD)   Start Date: 06/03/2021  Expected End Date: 05/28/2022  Priority: High  Note:   Current Barriers:  01/04/22 RNCM called for scheduled follow up: Ms. Sumida reports she is still at work and does not have time to talk, but wanted to report that she feels she is retaining fluid. She states a blood pressure medication was decreased and feels that it has attributed to fluid retention. She reports her weight today is at 205.4 lb and reports she has been at 200 lbs. In addition she reports puffiness around lower extremity/ankles and plans to follow up with PCP at next visit in July. RNCM encouraged Ms Pellum to contact provider now to notify to avoid worsening condition.   Knowledge Deficits related to plan of care for management of HTN, HLD, and Asthma   RNCM Clinical Goal(s):  Patient will verbalize understanding of plan for management of HTN, HLD, and Asthma as evidenced by voicing understanding, taking medications as prescribed,  attending provider visits as scheduled, notifying provider with questions and concerns, self report and/or chart notation take all medications exactly as prescribed and will call provider for medication related questions as evidenced by self report or as noted in chart    through collaboration with RN Care manager, provider, and care team.   Interventions: 1:1 collaboration with primary care provider regarding development and update of comprehensive plan of care as evidenced by provider attestation and co-signature Inter-disciplinary care team collaboration (see longitudinal plan of care) Evaluation of current treatment plan related to  self management and patient's adherence to plan as established by provider  Lower Extremity weakness interventions  (Status:  Goal on track:  Yes.)  Long Term Goal. Advised  patient to maintain fall prevention strategies   Provided fall prevention education Attending physical therapy sessions  Asthma Interventions: (Status:Condition stable.  Not addressed this visit.) Long Term Goal Provided patient with basic written and verbal Asthma education on self care/management/and exacerbation prevention Advised patient to track and manage Asthma triggers Advised patient to engage in light exercise as tolerated 3-5 days a week to aid in the the management of Asthma  Collaboration with clinical pharmacist regarding procurement of Pulmicort Provided asthma education Discussed weight loss measures: increase activity, eat healthy: avoid breads and processed foods, trans fats and saturated fats  Hypertension Interventions: (Status: On track.) Long Term Goal Last practice recorded BP readings:  BP Readings from Last 3 Encounters:  12/21/21 140/78  12/20/21 120/78  11/19/21 132/62  Most recent eGFR/CrCl:  Lab Results  Component Value Date   EGFR 81 06/17/2021    No components found for: CRCL  Evaluation of current treatment plan related to hypertension self management and patient's adherence to plan as established by provider; Provided education to patient re: stroke prevention, s/s of heart attack and stroke; Reviewed medications with patient and discussed importance of compliance; Discussed plans with patient for ongoing care management follow up and provided patient with direct contact information for care management team; Reviewed scheduled/upcoming provider appointments including:  Encouraged to contact provider with any questions or concerns  Hyperlipidemia Interventions: (Status: On track.) Long Term Goal Reviewed medications and encouraged to take as prescribed  Provided HLD educational materials;  Discussed diet and encouraged to continue to avoid saturated fats and transfats  Patient Goals/Self-Care Activities: Contact provider for concerns regarding  fluid retention and increased weight gain check blood pressure daily routinely and notify provider if outside recommended parameters keep all doctor appointments take medications as prescribed and contact clinical pharmacist if unable to obtain medications attend rehab therapy sessions and perform exercise per therapist recommendation eat more whole grains, fruits and vegetables, lean meats and healthy fats   Plan: The Care Management Team will contact you to schedule your next appointment.   Thea Silversmith, RN, MSN, BSN, CCM Care Management Coordinator [facility] [phone]

## 2022-01-05 ENCOUNTER — Encounter: Payer: Self-pay | Admitting: Physical Therapy

## 2022-01-05 ENCOUNTER — Encounter: Payer: Self-pay | Admitting: Family Medicine

## 2022-01-05 ENCOUNTER — Ambulatory Visit: Payer: Medicare Other | Admitting: Physical Therapy

## 2022-01-05 DIAGNOSIS — R2681 Unsteadiness on feet: Secondary | ICD-10-CM

## 2022-01-05 DIAGNOSIS — M6281 Muscle weakness (generalized): Secondary | ICD-10-CM

## 2022-01-05 DIAGNOSIS — R262 Difficulty in walking, not elsewhere classified: Secondary | ICD-10-CM

## 2022-01-05 NOTE — Therapy (Signed)
OUTPATIENT PHYSICAL THERAPY  Treatment Note       Patient Name: Monica Wallace MRN: [id number] DOB:06-17-1951, 71 y.o., female Today's Date: 01/05/2022   PT End of Session - 01/05/22 1715     Visit Number 11    Number of Visits 16    Date for PT Re-Evaluation 02/10/22    Authorization Type Medicare & AARP    Progress Note Due on Visit 64    PT Start Time 1709    Activity Tolerance Patient tolerated treatment well    Behavior During Therapy [facility] for tasks assessed/performed              Rationale for Evaluation and Treatment Rehabilitation     Past Medical History:  Diagnosis Date   Arthritis    Asthma    Baker's cyst of knee, right 02/16/2017   Cancer ([place]) 2002   thyroid (2002)   Cystic breast    Diabetes ([place])    GERD (gastroesophageal reflux disease)    Hepatic cyst 02/02/2015   Hyperlipidemia    Hypertension    Insomnia 07/23/2016   Menopause    Migraine    Palpitations    Preventative health care 01/18/2016   Sleep apnea 02/02/2015   Thyroid disease    Past Surgical History:  Procedure Laterality Date   ABDOMINAL HYSTERECTOMY     took right ovary and uterus   APPENDECTOMY     CARPAL TUNNEL RELEASE Right    CESAREAN SECTION     CHOLECYSTECTOMY     EYE SURGERY Bilateral 2017   cataracts   KNEE ARTHROPLASTY     SHOULDER ARTHROSCOPY W/ ROTATOR CUFF REPAIR Left    THYROIDECTOMY  2002   Patient Active Problem List   Diagnosis Date Noted   Unsteady gait 11/14/2021   Lumbar radiculopathy 09/09/2021   Cervical cancer screening 03/07/2021   Allergic conjunctivitis 04/08/2020   Neck pain 02/06/2020   Hip pain, right 09/30/2019   COVID-19 09/30/2019   Educated about COVID-19 virus infection 12/05/2018   Baker's cyst of knee, right 02/16/2017   Insomnia 07/23/2016   Hypokalemia 07/23/2016   Controlled type 2 diabetes mellitus without complication, without long-term current use of insulin ([place]) 01/24/2016   Preventative health care 01/18/2016    Left-sided low back pain with left-sided sciatica 10/07/2015   Obesity (BMI 30-39.9) 06/05/2015   OSA (obstructive sleep apnea) 02/02/2015   Palpitations 09/19/2014   Plantar fasciitis, bilateral 11/19/2013   Pronation deformity of ankle, acquired 11/19/2013   DJD (degenerative joint disease) 08/26/2013   Fibrocystic breast 08/26/2013   HTN (hypertension) 08/26/2013   GERD (gastroesophageal reflux disease) 08/26/2013   S/P hysterectomy with oophorectomy 08/26/2013   Hypothyroidism 08/26/2013   Chronic hoarseness 08/26/2013   Colon polyp 08/26/2013   Hiatal hernia 08/26/2013   Irritable bowel syndrome 08/26/2013   Moderate persistent asthma 08/26/2013   Allergic rhinoconjunctivitis 08/26/2013   Hyperlipidemia 08/22/2013   Migraine 08/22/2013   Thyroid cancer ([place]) 08/22/2013    PCP: [name] MD  REFERRING PROVIDER: Alric Ran, MD  REFERRING DIAG: R26.9 (ICD-10-CM) - Gait abnormality W19.XXXS (ICD-10-CM) - Fall, sequela  THERAPY DIAG:  No diagnosis found.  ONSET DATE: ~1 month  SUBJECTIVE:   SUBJECTIVE STATEMENT: Pt. Has returned to work, feels worn out today.  Her L heel has been hurting a lot, occasional sharp pain 10/10.  Frustrated because this is interfering with her progress.    PERTINENT HISTORY: asthma, hypertension, thyroid disease and diabetes mellitus type 2, cervical radiculopathy,  arthritis, history LBP  PAIN:  Are you having pain? 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     OBJECTIVE:   DIAGNOSTIC FINDINGS: MRI cervical spine was unchanged from prior and it showed moderate right-sided C3 thru C7 moderate foraminal stenosis, there were no spinal canal stenosis. MRI Lumbar spine 09/19/21 1. Moderate lower lumbar facet arthrosis, which may serve as a source of local low back pain. 2. No spinal canal  or neural foraminal stenosis. MRI Cervical spine 07/31/2021 1. Unchanged examination of the cervical spine with moderate right C3-4 and mild right C4-5 and left C6-7 neural foraminal stenosis. 2. No spinal canal stenosis   PATIENT SURVEYS:  ABC scale 67% (11/29/2021)   POSTURE:  Forward head posture  LE MMT:  MMT Right 11/24/2021 Left 11/24/2021  Hip flexion 4 5  Hip extension 4 4+  Hip abduction 5 5  Hip adduction 5 5  Knee flexion 5 5  Knee extension 5 5  Ankle dorsiflexion 5 5   (Blank rows = not tested)   FUNCTIONAL TESTS:  5 times sit to stand: 18.6 sec hands on thighs Tandem L foot forward 30 sec, R foot forward 10 sec  mCTSIB condition 1 30 sec, condition 2 30 sec, condition 3 30 sec, condition 4 30 sec but large sway.  DGI: 16/24 high risk for falls.    12/30/2021 DGI 21/24  GAIT: Distance walked: 150  Assistive device utilized: None Level of assistance: Complete Independence Comments:  ModI on stairs with decreased eccentric control R quad descending.  Gait Speed: 0.5 m/s 12/30/2021 - 600' before needed to stop due to R hip pain   TODAY'S TREATMENT: 01/04/22 Therapeutic Exercise: to improve strength and mobility.  Demo, verbal and tactile cues throughout for technique. Gait x 5 min  Heel cord stretch 3 x 30 sec hold L Review of PF exercises  Manual Therapy: to decrease muscle spasm and pain and improve mobility IASTM to L plantar fascia and achilles insertion and STM/TPR to gastrocs/soleus, calcaneal mobs, skilled palpation and monitoring during dry needling. Trigger Point Dry-Needling  Treatment instructions: Expect mild to moderate muscle soreness. Patient verbalized understanding of these instructions and education.  Patient Consent Given: Yes Education handout provided: Previously provided Muscles treated: L gastroc and soleus Treatment response/outcome: Twitch Response Elicited and Palpable Increase in Muscle Length   01/03/22 Therapeutic  Exercise: Bike L2x65mn Step down 4' 2x10 bil - only able to do 8 reps on last set with L leg Lateral step down from 6' step 2x10- cues not to tilt pelvis Standing hip flexion 10x3 sec Standing hip abduction 10x3 sec Bird dog in standing 10x bil  Manual Therapy: IASTM to distal gastroc into achilles tendon and achilles insertion with s/s tool PROM into dorsiflexion with knee extended to stretch achilles   12/30/2021 Therapeutic Exercise: to improve strength and mobility.  Demo, verbal and tactile cues throughout for technique. Bike L 1 x 6 min  Piriformis stretches seated - cross knee pushing down, KTOS, and forward flexion.  Physical Performance: 6MTW -600', DGI 21/24, assessment stairs Manual Therapy: to decrease muscle spasm and pain and improve mobility STM/TPR to R glut med and piriformis, skilled palpation and monitoring dry needling.  Trigger Point Dry-Needling  Treatment instructions: Expect mild to moderate muscle soreness. S/S of pneumothorax if dry needled over a lung field,  and to seek immediate medical attention should they occur. Patient verbalized understanding of these instructions and education.  Patient Consent Given: Yes Education handout provided: Previously provided Muscles treated: R glut med, R piriformis Electrical stimulation performed: No Parameters: N/A Treatment response/outcome: Twitch Response Elicited and Palpable Increase in Muscle Length   PATIENT EDUCATION:  Education details: education on ice massage and exercises for plantar fasciitis  Person educated: Patient Education method: Explanation Education comprehension: verbalized understanding   HOME EXERCISE PROGRAM: Access Code: [email or web address]  ASSESSMENT:  CLINICAL IMPRESSION:  Jaquayla arrived late today and very fatigued from work, also reporting L plantar fasciitis/achilles pain, which limited participating in exercise, so focused session on manual therapy to LLE to decrease pain and reviewed ways  to manage symptoms including ice massage and frozen water bottle to massage foot.  She would benefit from continued skilled therapy.   GOALS: Goals reviewed with patient? Yes  SHORT TERM GOALS: Target date: 12/08/2021    Independent with initial HEP Baseline: Goal status: MET  2.  Complete ABC scale  Baseline:  Goal status: MET   LONG TERM GOALS: Target date: 01/05/2022  extended to 02/10/22   Ind with progresed HEP to improve outcomes.  Baseline:  Goal status: IN PROGRESS 12/30/21- met for current  2.  Be able to ascend/descend stairs with 1 HR and good eccentric control for safety.  Baseline:  Goal status: IN PROGRESS 12/30/21- caught foot 2x, pulling up slightly ascending, decreased eccentric control R quad descending   3.  Be able to walk 15 minutes over even and uneven surfaces to return to work Baseline:  Goal status: IN PROGRESS 12/30/21- increased hip pain after 5 min walking  4.  Be able to maintain tandem stance x 30 sec bil  Baseline: 30 sec L, 10 sec R   11/29/21- maintained 30 sec bil  Goal status: MET  5.  Improve functional strength by improving 5x STS score to <14 sec to decrease risk of falls Baseline: 18.6 sec  Goal status: MET (12/27/21)   6.  Improve DGI to >19/24 to decrease fall risk  Baseline: 16/24 Goal status: MET 12/30/21- 21/24   PLAN: PT FREQUENCY: 1-2x/week  PT DURATION: 6 weeks  PLANNED INTERVENTIONS: Therapeutic exercises, Therapeutic activity, Neuromuscular re-education, Balance training, Gait training, Patient/Family education, Joint mobilization, Stair training, Dry Needling, Cryotherapy, Moist heat, and Manual therapy  PLAN FOR NEXT SESSION: continue LE and balance exercises. steps for stair endurance.    Rennie Natter, PT , DPT  01/05/2022, 5:16 PM

## 2022-01-06 ENCOUNTER — Other Ambulatory Visit: Payer: Self-pay

## 2022-01-06 MED ORDER — FUROSEMIDE 20 MG PO TABS: ORAL_TABLET | ORAL | 3 refills | Status: DC

## 2022-01-07 ENCOUNTER — Telehealth: Payer: Medicare Other

## 2022-01-12 ENCOUNTER — Encounter: Payer: Medicare Other | Admitting: Physical Therapy

## 2022-01-12 DIAGNOSIS — Z6838 Body mass index (BMI) 38.0-38.9, adult: Secondary | ICD-10-CM | POA: Diagnosis not present

## 2022-01-12 DIAGNOSIS — M4316 Spondylolisthesis, lumbar region: Secondary | ICD-10-CM | POA: Diagnosis not present

## 2022-01-14 DIAGNOSIS — I10 Essential (primary) hypertension: Secondary | ICD-10-CM

## 2022-01-14 DIAGNOSIS — E785 Hyperlipidemia, unspecified: Secondary | ICD-10-CM

## 2022-01-14 DIAGNOSIS — J45909 Unspecified asthma, uncomplicated: Secondary | ICD-10-CM

## 2022-01-24 ENCOUNTER — Ambulatory Visit: Payer: Medicare Other | Attending: Family Medicine

## 2022-01-24 DIAGNOSIS — M6281 Muscle weakness (generalized): Secondary | ICD-10-CM | POA: Diagnosis not present

## 2022-01-24 DIAGNOSIS — R262 Difficulty in walking, not elsewhere classified: Secondary | ICD-10-CM | POA: Insufficient documentation

## 2022-01-24 DIAGNOSIS — R2681 Unsteadiness on feet: Secondary | ICD-10-CM | POA: Diagnosis not present

## 2022-01-24 NOTE — Therapy (Signed)
OUTPATIENT PHYSICAL THERAPY  Treatment Note       Patient Name: Monica Wallace MRN: [id number] DOB:1950/12/07, 71 y.o., female Today's Date: 01/24/2022   PT End of Session - 01/24/22 1748     Visit Number 12    Number of Visits 16    Date for PT Re-Evaluation 02/10/22    Authorization Type Medicare & AARP    Progress Note Due on Visit 25    PT Start Time 1701    PT Stop Time 1744    PT Time Calculation (min) 43 min    Activity Tolerance Patient tolerated treatment well    Behavior During Therapy [facility] for tasks assessed/performed               Rationale for Evaluation and Treatment Rehabilitation     Past Medical History:  Diagnosis Date   Arthritis    Asthma    Baker's cyst of knee, right 02/16/2017   Cancer ([place]) 2002   thyroid (2002)   Cystic breast    Diabetes ([place])    GERD (gastroesophageal reflux disease)    Hepatic cyst 02/02/2015   Hyperlipidemia    Hypertension    Insomnia 07/23/2016   Menopause    Migraine    Palpitations    Preventative health care 01/18/2016   Sleep apnea 02/02/2015   Thyroid disease    Past Surgical History:  Procedure Laterality Date   ABDOMINAL HYSTERECTOMY     took right ovary and uterus   APPENDECTOMY     CARPAL TUNNEL RELEASE Right    CESAREAN SECTION     CHOLECYSTECTOMY     EYE SURGERY Bilateral 2017   cataracts   KNEE ARTHROPLASTY     SHOULDER ARTHROSCOPY W/ ROTATOR CUFF REPAIR Left    THYROIDECTOMY  2002   Patient Active Problem List   Diagnosis Date Noted   Unsteady gait 11/14/2021   Lumbar radiculopathy 09/09/2021   Cervical cancer screening 03/07/2021   Allergic conjunctivitis 04/08/2020   Neck pain 02/06/2020   Hip pain, right 09/30/2019   COVID-19 09/30/2019   Educated about COVID-19 virus infection 12/05/2018   Baker's cyst of knee, right 02/16/2017   Insomnia 07/23/2016   Hypokalemia 07/23/2016   Controlled type 2 diabetes mellitus without complication, without long-term current use of  insulin ([place]) 01/24/2016   Preventative health care 01/18/2016   Left-sided low back pain with left-sided sciatica 10/07/2015   Obesity (BMI 30-39.9) 06/05/2015   OSA (obstructive sleep apnea) 02/02/2015   Palpitations 09/19/2014   Plantar fasciitis, bilateral 11/19/2013   Pronation deformity of ankle, acquired 11/19/2013   DJD (degenerative joint disease) 08/26/2013   Fibrocystic breast 08/26/2013   HTN (hypertension) 08/26/2013   GERD (gastroesophageal reflux disease) 08/26/2013   S/P hysterectomy with oophorectomy 08/26/2013   Hypothyroidism 08/26/2013   Chronic hoarseness 08/26/2013   Colon polyp 08/26/2013   Hiatal hernia 08/26/2013   Irritable bowel syndrome 08/26/2013   Moderate persistent asthma 08/26/2013   Allergic rhinoconjunctivitis 08/26/2013   Hyperlipidemia 08/22/2013   Migraine 08/22/2013   Thyroid cancer ([place]) 08/22/2013    PCP: [name] MD  REFERRING PROVIDER: Alric Ran, MD  REFERRING DIAG: R26.9 (ICD-10-CM) - Gait abnormality W19.XXXS (ICD-10-CM) - Fall, sequela  THERAPY DIAG:  Unsteadiness on feet  Muscle weakness (generalized)  Difficulty in walking, not elsewhere classified  ONSET DATE: ~1 month  SUBJECTIVE:   SUBJECTIVE STATEMENT: "Feeling tired ever since being back at work from 6/15."  PERTINENT HISTORY: asthma, hypertension, thyroid disease and diabetes  mellitus type 2, cervical radiculopathy, arthritis, history LBP  PAIN:  Are you having pain? 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     OBJECTIVE:   DIAGNOSTIC FINDINGS: MRI cervical spine was unchanged from prior and it showed moderate right-sided C3 thru C7 moderate foraminal stenosis, there were no spinal canal stenosis. MRI Lumbar spine 09/19/21 1. Moderate lower lumbar facet arthrosis, which may serve as a source of  local low back pain. 2. No spinal canal or neural foraminal stenosis. MRI Cervical spine 07/31/2021 1. Unchanged examination of the cervical spine with moderate right C3-4 and mild right C4-5 and left C6-7 neural foraminal stenosis. 2. No spinal canal stenosis   PATIENT SURVEYS:  ABC scale 67% (11/29/2021)   POSTURE:  Forward head posture  LE MMT:  MMT Right 11/24/2021 Left 11/24/2021  Hip flexion 4 5  Hip extension 4 4+  Hip abduction 5 5  Hip adduction 5 5  Knee flexion 5 5  Knee extension 5 5  Ankle dorsiflexion 5 5   (Blank rows = not tested)   FUNCTIONAL TESTS:  5 times sit to stand: 18.6 sec hands on thighs Tandem L foot forward 30 sec, R foot forward 10 sec  mCTSIB condition 1 30 sec, condition 2 30 sec, condition 3 30 sec, condition 4 30 sec but large sway.  DGI: 16/24 high risk for falls.    12/30/2021 DGI 21/24  GAIT: Distance walked: 150  Assistive device utilized: None Level of assistance: Complete Independence Comments:  ModI on stairs with decreased eccentric control R quad descending.  Gait Speed: 0.5 m/s 12/30/2021 - 600' before needed to stop due to R hip pain   TODAY'S TREATMENT: 01/24/22 Therapeutic Exercise: Recumbent Bike L2x12mn Fwd step downs 2x10 L LE Lat step up 2x10 R LE Heel raise with counter support 20 reps Toe raise with counter support 20 reps Standing 4 way hip with 2# weight 2x10 Standing gastroc stretch on step 2x30"  01/04/22 Therapeutic Exercise: to improve strength and mobility.  Demo, verbal and tactile cues throughout for technique. Gait x 5 min  Heel cord stretch 3 x 30 sec hold L Review of PF exercises  Manual Therapy: to decrease muscle spasm and pain and improve mobility IASTM to L plantar fascia and achilles insertion and STM/TPR to gastrocs/soleus, calcaneal mobs, skilled palpation and monitoring during dry needling. Trigger Point Dry-Needling  Treatment instructions: Expect mild to moderate muscle soreness. Patient  verbalized understanding of these instructions and education.  Patient Consent Given: Yes Education handout provided: Previously provided Muscles treated: L gastroc and soleus Treatment response/outcome: Twitch Response Elicited and Palpable Increase in Muscle Length   01/03/22 Therapeutic Exercise: Bike L2x656m Step down 4' 2x10 bil - only able to do 8 reps on last set with L leg Lateral step down from 6' step 2x10- cues not to tilt pelvis Standing hip flexion 10x3 sec Standing hip abduction 10x3 sec Bird dog in standing 10x bil  Manual Therapy: IASTM to distal gastroc into achilles tendon and achilles insertion with s/s tool PROM into dorsiflexion with knee extended to stretch achilles   12/30/2021 Therapeutic Exercise: to improve strength and mobility.  Demo, verbal and tactile cues throughout for technique. Bike L 1 x 6 min  Piriformis stretches seated - cross knee pushing down, KTOS, and forward flexion.  Physical  Performance: 6MTW -600', DGI 21/24, assessment stairs Manual Therapy: to decrease muscle spasm and pain and improve mobility STM/TPR to R glut med and piriformis, skilled palpation and monitoring dry needling.  Trigger Point Dry-Needling  Treatment instructions: Expect mild to moderate muscle soreness. S/S of pneumothorax if dry needled over a lung field, and to seek immediate medical attention should they occur. Patient verbalized understanding of these instructions and education.  Patient Consent Given: Yes Education handout provided: Previously provided Muscles treated: R glut med, R piriformis Electrical stimulation performed: No Parameters: N/A Treatment response/outcome: Twitch Response Elicited and Palpable Increase in Muscle Length   PATIENT EDUCATION:  Education details: education on ice massage and exercises for plantar fasciitis  Person educated: Patient Education method: Explanation Education comprehension: verbalized understanding   HOME EXERCISE  PROGRAM: Access Code: [email or web address]  ASSESSMENT:  CLINICAL IMPRESSION:  Genna still reports fatigue from returning to work recently. Focused on hip strengthening to improve LE stability with gait and ADLs. She was limited by fatigue with 4 way hip, needed a seated break x 1 for recovery. Pt noted being able to walk 15 min but on even ground so is progressing toward LTG #3.   GOALS: Goals reviewed with patient? Yes  SHORT TERM GOALS: Target date: 12/08/2021    Independent with initial HEP Baseline: Goal status: MET  2.  Complete ABC scale  Baseline:  Goal status: MET   LONG TERM GOALS: Target date: 01/05/2022  extended to 02/10/22   Ind with progresed HEP to improve outcomes.  Baseline:  Goal status: IN PROGRESS 12/30/21- met for current  2.  Be able to ascend/descend stairs with 1 HR and good eccentric control for safety.  Baseline:  Goal status: IN PROGRESS 12/30/21- caught foot 2x, pulling up slightly ascending, decreased eccentric control R quad descending   3.  Be able to walk 15 minutes over even and uneven surfaces to return to work Baseline:  Goal status: IN PROGRESS 01/24/22-  pt reports being able to walk 15 min on even surface  4.  Be able to maintain tandem stance x 30 sec bil  Baseline: 30 sec L, 10 sec R   11/29/21- maintained 30 sec bil  Goal status: MET  5.  Improve functional strength by improving 5x STS score to <14 sec to decrease risk of falls Baseline: 18.6 sec  Goal status: MET (12/27/21)   6.  Improve DGI to >19/24 to decrease fall risk  Baseline: 16/24 Goal status: MET 12/30/21- 21/24   PLAN: PT FREQUENCY: 1-2x/week  PT DURATION: 6 weeks  PLANNED INTERVENTIONS: Therapeutic exercises, Therapeutic activity, Neuromuscular re-education, Balance training, Gait training, Patient/Family education, Joint mobilization, Stair training, Dry Needling, Cryotherapy, Moist heat, and Manual therapy  PLAN FOR NEXT SESSION: continue LE and balance exercises. steps for  stair endurance.    Artist Pais, PTA  01/24/2022, 5:48 PM

## 2022-01-27 ENCOUNTER — Ambulatory Visit: Payer: Medicare Other | Admitting: Physical Therapy

## 2022-01-27 ENCOUNTER — Encounter: Payer: Self-pay | Admitting: Physical Therapy

## 2022-01-27 ENCOUNTER — Telehealth: Payer: Self-pay

## 2022-01-27 DIAGNOSIS — M6281 Muscle weakness (generalized): Secondary | ICD-10-CM | POA: Diagnosis not present

## 2022-01-27 DIAGNOSIS — R2681 Unsteadiness on feet: Secondary | ICD-10-CM

## 2022-01-27 DIAGNOSIS — R262 Difficulty in walking, not elsewhere classified: Secondary | ICD-10-CM | POA: Diagnosis not present

## 2022-01-27 NOTE — Therapy (Signed)
OUTPATIENT PHYSICAL THERAPY  Treatment Note       Patient Name: KENNEDEY DIGILIO MRN: [id number] DOB:24-Oct-1950, 71 y.o., female Today's Date: 01/27/2022   PT End of Session - 01/27/22 1703     Visit Number 13    Number of Visits 16    Date for PT Re-Evaluation 02/10/22    Authorization Type Medicare & [employer]    Progress Note Due on Visit 50    PT Start Time 1702    PT Stop Time 1742    PT Time Calculation (min) 40 min    Activity Tolerance Patient tolerated treatment well    Behavior During Therapy [facility] for tasks assessed/performed               Rationale for Evaluation and Treatment Rehabilitation     Past Medical History:  Diagnosis Date   Arthritis    Asthma    Baker's cyst of knee, right 02/16/2017   Cancer ([place]) 2002   thyroid (2002)   Cystic breast    Diabetes ([place])    GERD (gastroesophageal reflux disease)    Hepatic cyst 02/02/2015   Hyperlipidemia    Hypertension    Insomnia 07/23/2016   Menopause    Migraine    Palpitations    Preventative health care 01/18/2016   Sleep apnea 02/02/2015   Thyroid disease    Past Surgical History:  Procedure Laterality Date   ABDOMINAL HYSTERECTOMY     took right ovary and uterus   APPENDECTOMY     CARPAL TUNNEL RELEASE Right    CESAREAN SECTION     CHOLECYSTECTOMY     EYE SURGERY Bilateral 2017   cataracts   KNEE ARTHROPLASTY     SHOULDER ARTHROSCOPY W/ ROTATOR CUFF REPAIR Left    THYROIDECTOMY  2002   Patient Active Problem List   Diagnosis Date Noted   Unsteady gait 11/14/2021   Lumbar radiculopathy 09/09/2021   Cervical cancer screening 03/07/2021   Allergic conjunctivitis 04/08/2020   Neck pain 02/06/2020   Hip pain, right 09/30/2019   COVID-19 09/30/2019   Educated about COVID-19 virus infection 12/05/2018   Baker's cyst of knee, right 02/16/2017   Insomnia 07/23/2016   Hypokalemia 07/23/2016   Controlled type 2 diabetes mellitus without complication, without long-term current use of  insulin ([place]) 01/24/2016   Preventative health care 01/18/2016   Left-sided low back pain with left-sided sciatica 10/07/2015   Obesity (BMI 30-39.9) 06/05/2015   OSA (obstructive sleep apnea) 02/02/2015   Palpitations 09/19/2014   Plantar fasciitis, bilateral 11/19/2013   Pronation deformity of ankle, acquired 11/19/2013   DJD (degenerative joint disease) 08/26/2013   Fibrocystic breast 08/26/2013   HTN (hypertension) 08/26/2013   GERD (gastroesophageal reflux disease) 08/26/2013   S/P hysterectomy with oophorectomy 08/26/2013   Hypothyroidism 08/26/2013   Chronic hoarseness 08/26/2013   Colon polyp 08/26/2013   Hiatal hernia 08/26/2013   Irritable bowel syndrome 08/26/2013   Moderate persistent asthma 08/26/2013   Allergic rhinoconjunctivitis 08/26/2013   Hyperlipidemia 08/22/2013   Migraine 08/22/2013   Thyroid cancer ([place]) 08/22/2013    PCP: [name] MD  REFERRING PROVIDER: Alric Ran, MD  REFERRING DIAG: R26.9 (ICD-10-CM) - Gait abnormality W19.XXXS (ICD-10-CM) - Fall, sequela  THERAPY DIAG:  Unsteadiness on feet  Muscle weakness (generalized)  Difficulty in walking, not elsewhere classified  ONSET DATE: ~1 month  SUBJECTIVE:   SUBJECTIVE STATEMENT: "Still getting used to being back at work."  Stretches helped for heel pain.  Stairs are still hard.  PERTINENT HISTORY: asthma, hypertension, thyroid disease and diabetes mellitus type 2, cervical radiculopathy, arthritis, history LBP  PAIN:  Are you having pain? 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     OBJECTIVE:   DIAGNOSTIC FINDINGS: MRI cervical spine was unchanged from prior and it showed moderate right-sided C3 thru C7 moderate foraminal stenosis, there were no spinal canal stenosis. MRI Lumbar spine 09/19/21 1. Moderate lower  lumbar facet arthrosis, which may serve as a source of local low back pain. 2. No spinal canal or neural foraminal stenosis. MRI Cervical spine 07/31/2021 1. Unchanged examination of the cervical spine with moderate right C3-4 and mild right C4-5 and left C6-7 neural foraminal stenosis. 2. No spinal canal stenosis   PATIENT SURVEYS:  ABC scale 67% (11/29/2021)   POSTURE:  Forward head posture  LE MMT:  MMT Right 11/24/2021 Left 11/24/2021  Hip flexion 4 5  Hip extension 4 4+  Hip abduction 5 5  Hip adduction 5 5  Knee flexion 5 5  Knee extension 5 5  Ankle dorsiflexion 5 5   (Blank rows = not tested)   FUNCTIONAL TESTS:  5 times sit to stand: 18.6 sec hands on thighs Tandem L foot forward 30 sec, R foot forward 10 sec  mCTSIB condition 1 30 sec, condition 2 30 sec, condition 3 30 sec, condition 4 30 sec but large sway.  DGI: 16/24 high risk for falls.    12/30/2021 DGI 21/24  GAIT: Distance walked: 150  Assistive device utilized: None Level of assistance: Complete Independence Comments:  ModI on stairs with decreased eccentric control R quad descending.  Gait Speed: 0.5 m/s 12/30/2021 - 600' before needed to stop due to R hip pain   TODAY'S TREATMENT: 01/27/22 Therapeutic Exercise: to improve strength and mobility.   Nustep L5 x 6 min  Heel/calf stretches Sit to stands x 5 - modified to minisquats x 10due to bil knee pain.  Neuromuscular Reeducation: to improve balance and stability. SBA for safety throughout.  Forward gait with ball tos (small lob x 1 due to catching toe) Side stepping with ball toss Forward, side and back stepping strategy with arm movements x 10 each side and motion.  Star excursion pattern x 3 each leg 180 deg  01/24/22 Therapeutic Exercise: Recumbent Bike L2x65mn Fwd step downs 2x10 L LE Lat step up 2x10 R LE Heel raise with counter support 20 reps Toe raise with counter support 20 reps Standing 4 way hip with 2# weight 2x10 Standing  gastroc stretch on step 2x30"  01/04/22 Therapeutic Exercise: to improve strength and mobility.  Demo, verbal and tactile cues throughout for technique. Gait x 5 min  Heel cord stretch 3 x 30 sec hold L Review of PF exercises  Manual Therapy: to decrease muscle spasm and pain and improve mobility IASTM to L plantar fascia and achilles insertion and STM/TPR to gastrocs/soleus, calcaneal mobs, skilled palpation and monitoring during dry needling. Trigger Point Dry-Needling  Treatment instructions: Expect mild to moderate muscle soreness. Patient verbalized understanding of these instructions and education.  Patient Consent Given: Yes Education handout provided: Previously provided Muscles treated: L gastroc and soleus Treatment response/outcome: Twitch Response Elicited and Palpable Increase in Muscle Length   01/03/22 Therapeutic Exercise: Bike L2x625m Step down 4' 2x10 bil - only able to do 8  reps on last set with L leg Lateral step down from 6' step 2x10- cues not to tilt pelvis Standing hip flexion 10x3 sec Standing hip abduction 10x3 sec Bird dog in standing 10x bil  Manual Therapy: IASTM to distal gastroc into achilles tendon and achilles insertion with s/s tool PROM into dorsiflexion with knee extended to stretch achilles     PATIENT EDUCATION:  Education details: education on ice massage and exercises for plantar fasciitis  Person educated: Patient Education method: Explanation Education comprehension: verbalized understanding   HOME EXERCISE PROGRAM: Access Code: [email or web address]  ASSESSMENT:  CLINICAL IMPRESSION:  Arienna was very fatigued today, and had several LOB today - catching R foot during gait, and dragging RLE with stepping strategy activity.  She also needed frequent breaks and cool water today due to heat intolerance.  SBA needed throughout session for safety.  She did report her heel pain improved with movements, but had increased knee pain bil with sit to stands  today. KANAYA GUNNARSON continues to demonstrate potential for improvement and would benefit from continued skilled therapy to address impairments.      GOALS: Goals reviewed with patient? Yes  SHORT TERM GOALS: Target date: 12/08/2021    Independent with initial HEP Baseline: Goal status: MET  2.  Complete ABC scale  Baseline:  Goal status: MET   LONG TERM GOALS: Target date: 01/05/2022  extended to 02/10/22   Ind with progresed HEP to improve outcomes.  Baseline:  Goal status: IN PROGRESS 12/30/21- met for current  2.  Be able to ascend/descend stairs with 1 HR and good eccentric control for safety.  Baseline:  Goal status: IN PROGRESS 12/30/21- caught foot 2x, pulling up slightly ascending, decreased eccentric control R quad descending   3.  Be able to walk 15 minutes over even and uneven surfaces to return to work Baseline:  Goal status: IN PROGRESS 01/24/22-  pt reports being able to walk 15 min on even surface  4.  Be able to maintain tandem stance x 30 sec bil  Baseline: 30 sec L, 10 sec R   11/29/21- maintained 30 sec bil  Goal status: MET  5.  Improve functional strength by improving 5x STS score to <14 sec to decrease risk of falls Baseline: 18.6 sec  Goal status: MET (12/27/21)   6.  Improve DGI to >19/24 to decrease fall risk  Baseline: 16/24 Goal status: MET 12/30/21- 21/24   PLAN: PT FREQUENCY: 1-2x/week  PT DURATION: 6 weeks  PLANNED INTERVENTIONS: Therapeutic exercises, Therapeutic activity, Neuromuscular re-education, Balance training, Gait training, Patient/Family education, Joint mobilization, Stair training, Dry Needling, Cryotherapy, Moist heat, and Manual therapy  PLAN FOR NEXT SESSION: continue LE and balance exercises. steps for stair endurance.    Rennie Natter, PT , DPT  01/27/2022, 5:48 PM

## 2022-01-27 NOTE — Telephone Encounter (Signed)
  Care Management   Follow Up Note   01/27/2022 Name: Monica Wallace MRN: [id number] DOB: 1950-10-08   Referred by: [name], MD Reason for referral : No chief complaint on file.   An unsuccessful telephone outreach was attempted today. The patient was referred to the case management team for assistance with care management and care coordination.   Follow Up Plan: The care management team will reach out to the patient again over the next 30 days.   Thea Silversmith, RN, MSN, BSN, CCM Care Management Coordinator [facility] [phone]

## 2022-01-31 ENCOUNTER — Ambulatory Visit: Payer: Medicare Other

## 2022-02-03 ENCOUNTER — Ambulatory Visit: Payer: Medicare Other

## 2022-02-03 DIAGNOSIS — M6281 Muscle weakness (generalized): Secondary | ICD-10-CM

## 2022-02-03 DIAGNOSIS — R2681 Unsteadiness on feet: Secondary | ICD-10-CM | POA: Diagnosis not present

## 2022-02-03 DIAGNOSIS — R262 Difficulty in walking, not elsewhere classified: Secondary | ICD-10-CM

## 2022-02-03 NOTE — Therapy (Signed)
OUTPATIENT PHYSICAL THERAPY  Treatment Note       Patient Name: Monica Wallace MRN: [id number] DOB:05/25/1951, 71 y.o., female Today's Date: 02/03/2022       Rationale for Evaluation and Treatment Rehabilitation     Past Medical History:  Diagnosis Date   Arthritis    Asthma    Baker's cyst of knee, right 02/16/2017   Cancer ([place]) 2002   thyroid (2002)   Cystic breast    Diabetes ([place])    GERD (gastroesophageal reflux disease)    Hepatic cyst 02/02/2015   Hyperlipidemia    Hypertension    Insomnia 07/23/2016   Menopause    Migraine    Palpitations    Preventative health care 01/18/2016   Sleep apnea 02/02/2015   Thyroid disease    Past Surgical History:  Procedure Laterality Date   ABDOMINAL HYSTERECTOMY     took right ovary and uterus   APPENDECTOMY     CARPAL TUNNEL RELEASE Right    CESAREAN SECTION     CHOLECYSTECTOMY     EYE SURGERY Bilateral 2017   cataracts   KNEE ARTHROPLASTY     SHOULDER ARTHROSCOPY W/ ROTATOR CUFF REPAIR Left    THYROIDECTOMY  2002   Patient Active Problem List   Diagnosis Date Noted   Unsteady gait 11/14/2021   Lumbar radiculopathy 09/09/2021   Cervical cancer screening 03/07/2021   Allergic conjunctivitis 04/08/2020   Neck pain 02/06/2020   Hip pain, right 09/30/2019   COVID-19 09/30/2019   Educated about COVID-19 virus infection 12/05/2018   Baker's cyst of knee, right 02/16/2017   Insomnia 07/23/2016   Hypokalemia 07/23/2016   Controlled type 2 diabetes mellitus without complication, without long-term current use of insulin ([place]) 01/24/2016   Preventative health care 01/18/2016   Left-sided low back pain with left-sided sciatica 10/07/2015   Obesity (BMI 30-39.9) 06/05/2015   OSA (obstructive sleep apnea) 02/02/2015   Palpitations 09/19/2014   Plantar fasciitis, bilateral 11/19/2013   Pronation deformity of ankle, acquired 11/19/2013   DJD (degenerative joint disease) 08/26/2013   Fibrocystic breast 08/26/2013    HTN (hypertension) 08/26/2013   GERD (gastroesophageal reflux disease) 08/26/2013   S/P hysterectomy with oophorectomy 08/26/2013   Hypothyroidism 08/26/2013   Chronic hoarseness 08/26/2013   Colon polyp 08/26/2013   Hiatal hernia 08/26/2013   Irritable bowel syndrome 08/26/2013   Moderate persistent asthma 08/26/2013   Allergic rhinoconjunctivitis 08/26/2013   Hyperlipidemia 08/22/2013   Migraine 08/22/2013   Thyroid cancer ([place]) 08/22/2013    PCP: [name] MD  REFERRING PROVIDER: Alric Ran, MD  REFERRING DIAG: R26.9 (ICD-10-CM) - Gait abnormality W19.XXXS (ICD-10-CM) - Fall, sequela  THERAPY DIAG:  Unsteadiness on feet  Muscle weakness (generalized)  Difficulty in walking, not elsewhere classified  ONSET DATE: ~1 month  SUBJECTIVE:   SUBJECTIVE STATEMENT: "Last week 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     OBJECTIVE:   DIAGNOSTIC FINDINGS: MRI cervical spine was unchanged from prior and it showed moderate right-sided C3  thru C7 moderate foraminal stenosis, there were no spinal canal stenosis. MRI Lumbar spine 09/19/21 1. Moderate lower lumbar facet arthrosis, which may serve as a source of local low back pain. 2. No spinal canal or neural foraminal stenosis. MRI Cervical spine 07/31/2021 1. Unchanged examination of the cervical spine with moderate right C3-4 and mild right C4-5 and left C6-7 neural foraminal stenosis. 2. No spinal canal stenosis   PATIENT SURVEYS:  ABC scale 67%  (11/29/2021)   POSTURE:  Forward head posture  LE MMT:  MMT Right 11/24/2021 Left 11/24/2021  Hip flexion 4 5  Hip extension 4 4+  Hip abduction 5 5  Hip adduction 5 5  Knee flexion 5 5  Knee extension 5 5  Ankle dorsiflexion 5 5   (Blank rows = not tested)   FUNCTIONAL TESTS:  5 times sit to stand: 18.6 sec hands on thighs Tandem L foot forward 30 sec, R foot forward 10 sec  mCTSIB condition 1 30 sec, condition 2 30 sec, condition 3 30 sec, condition 4 30 sec but large sway.  DGI: 16/24 high risk for falls.    12/30/2021 DGI 21/24  GAIT: Distance walked: 150  Assistive device utilized: None Level of assistance: Complete Independence Comments:  ModI on stairs with decreased eccentric control R quad descending.  Gait Speed: 0.5 m/s 12/30/2021 - 600' before needed to stop due to R hip pain   TODAY'S TREATMENT: 02/03/22 Therapeutic Exercise: Nustep L5x73mn  Manual Therapy: STM to R patellar ligament, pes anserine PROM with slight knee distraction  Gait: Stairs: 7 inch, 52 stairs - decreased tolerance for eccentric loading of R knee - post MT 26 stairs with improved mechanics and tolerance for eccentric loading of R knee  01/27/22 Therapeutic Exercise: to improve strength and mobility.   Nustep L5 x 6 min  Heel/calf stretches Sit to stands x 5 - modified to minisquats x 10due to bil knee pain.  Neuromuscular Reeducation: to improve balance and stability. SBA for safety throughout.  Forward gait with ball tos (small lob x 1 due to catching toe) Side stepping with ball toss Forward, side and back stepping strategy with arm movements x 10 each side and motion.  Star excursion pattern x 3 each leg 180 deg  01/24/22 Therapeutic Exercise: Recumbent Bike L2x664m Fwd step downs 2x10 L LE Lat step up 2x10 R LE Heel raise with counter support 20 reps Toe raise with counter support 20 reps Standing 4 way hip with 2# weight 2x10 Standing gastroc stretch on step  2x30"  01/04/22 Therapeutic Exercise: to improve strength and mobility.  Demo, verbal and tactile cues throughout for technique. Gait x 5 min  Heel cord stretch 3 x 30 sec hold L Review of PF exercises  Manual Therapy: to decrease muscle spasm and pain and improve mobility IASTM to L plantar fascia and achilles insertion and STM/TPR to gastrocs/soleus, calcaneal mobs, skilled palpation and monitoring during dry needling. Trigger Point Dry-Needling  Treatment instructions: Expect mild to moderate muscle soreness. Patient verbalized understanding of these instructions and education.  Patient Consent Given: Yes Education handout provided: Previously provided Muscles treated: L gastroc and soleus Treatment response/outcome: Twitch Response Elicited and Palpable Increase in Muscle Length   PATIENT EDUCATION:  Education details: education on ice massage and exercises for plantar fasciitis  Person educated: Patient Education method: Explanation Education comprehension: verbalized understanding   HOME EXERCISE PROGRAM: Access Code: [email or web address]:  CLINICAL IMPRESSION:  Today focused on gait  with stairs  to help with functional mobility. Initially with stairs she had decreased eccentric quad control on R after MT she was able to accept more weight and show more control descending stairs. She showed TTP along patellar tendon and pes anserine. Pt is progressing well toward goals.    GOALS: Goals reviewed with patient? Yes  SHORT TERM GOALS: Target date: 12/08/2021    Independent with initial HEP Baseline: Goal status: MET  2.  Complete ABC scale  Baseline:  Goal status: MET   LONG TERM GOALS: Target date: 01/05/2022  extended to 02/10/22   Ind with progresed HEP to improve outcomes.  Baseline:  Goal status: IN PROGRESS 12/30/21- met for current  2.  Be able to ascend/descend stairs with 1 HR and good eccentric control for safety.  Baseline:  Goal status: IN PROGRESS  02/03/22 - check treatment  3.  Be able to walk 15 minutes over even and uneven surfaces to return to work Baseline:  Goal status: IN PROGRESS 01/24/22-  pt reports being able to walk 15 min on even surface  4.  Be able to maintain tandem stance x 30 sec bil  Baseline: 30 sec L, 10 sec R   11/29/21- maintained 30 sec bil  Goal status: MET  5.  Improve functional strength by improving 5x STS score to <14 sec to decrease risk of falls Baseline: 18.6 sec  Goal status: MET (12/27/21)   6.  Improve DGI to >19/24 to decrease fall risk  Baseline: 16/24 Goal status: MET 12/30/21- 21/24   PLAN: PT FREQUENCY: 1-2x/week  PT DURATION: 6 weeks  PLANNED INTERVENTIONS: Therapeutic exercises, Therapeutic activity, Neuromuscular re-education, Balance training, Gait training, Patient/Family education, Joint mobilization, Stair training, Dry Needling, Cryotherapy, Moist heat, and Manual therapy  PLAN FOR NEXT SESSION: continue LE and balance exercises. steps for stair endurance.    Artist Pais, PTA  02/03/2022, 5:07 PM

## 2022-02-07 ENCOUNTER — Telehealth: Payer: Self-pay

## 2022-02-07 ENCOUNTER — Ambulatory Visit: Payer: Medicare Other

## 2022-02-07 ENCOUNTER — Other Ambulatory Visit: Payer: Self-pay | Admitting: Family

## 2022-02-07 DIAGNOSIS — R262 Difficulty in walking, not elsewhere classified: Secondary | ICD-10-CM

## 2022-02-07 DIAGNOSIS — M6281 Muscle weakness (generalized): Secondary | ICD-10-CM

## 2022-02-07 DIAGNOSIS — R2681 Unsteadiness on feet: Secondary | ICD-10-CM

## 2022-02-07 NOTE — Telephone Encounter (Signed)
  Care Management   Follow Up Note   02/07/2022 Name: Monica Wallace MRN: [id number] DOB: 05/21/1951   Referred by: [name], MD Reason for referral : No chief complaint on file.   A second unsuccessful telephone outreach was attempted today. The patient was referred to the case management team for assistance with care management and care coordination.   Follow Up Plan: The care management team will reach out to the patient again over the next 30 days.   Thea Silversmith, RN, MSN, BSN, CCM Care Management Coordinator [facility] [phone]

## 2022-02-07 NOTE — Therapy (Signed)
OUTPATIENT PHYSICAL THERAPY  Treatment Note       Patient Name: Monica Wallace MRN: [id number] DOB:06/24/1951, 71 y.o., female Today's Date: 02/07/2022   PT End of Session - 02/07/22 1801     Visit Number 15    Number of Visits 16    Date for PT Re-Evaluation 02/10/22    Authorization Type Medicare & AARP    Progress Note Due on Visit 72    PT Start Time 1702    PT Stop Time 1745    PT Time Calculation (min) 43 min    Activity Tolerance Patient tolerated treatment well    Behavior During Therapy [facility] for tasks assessed/performed                Rationale for Evaluation and Treatment Rehabilitation     Past Medical History:  Diagnosis Date   Arthritis    Asthma    Baker's cyst of knee, right 02/16/2017   Cancer ([place]) 2002   thyroid (2002)   Cystic breast    Diabetes ([place])    GERD (gastroesophageal reflux disease)    Hepatic cyst 02/02/2015   Hyperlipidemia    Hypertension    Insomnia 07/23/2016   Menopause    Migraine    Palpitations    Preventative health care 01/18/2016   Sleep apnea 02/02/2015   Thyroid disease    Past Surgical History:  Procedure Laterality Date   ABDOMINAL HYSTERECTOMY     took right ovary and uterus   APPENDECTOMY     CARPAL TUNNEL RELEASE Right    CESAREAN SECTION     CHOLECYSTECTOMY     EYE SURGERY Bilateral 2017   cataracts   KNEE ARTHROPLASTY     SHOULDER ARTHROSCOPY W/ ROTATOR CUFF REPAIR Left    THYROIDECTOMY  2002   Patient Active Problem List   Diagnosis Date Noted   Unsteady gait 11/14/2021   Lumbar radiculopathy 09/09/2021   Cervical cancer screening 03/07/2021   Allergic conjunctivitis 04/08/2020   Neck pain 02/06/2020   Hip pain, right 09/30/2019   COVID-19 09/30/2019   Educated about COVID-19 virus infection 12/05/2018   Baker's cyst of knee, right 02/16/2017   Insomnia 07/23/2016   Hypokalemia 07/23/2016   Controlled type 2 diabetes mellitus without complication, without long-term current use of  insulin ([place]) 01/24/2016   Preventative health care 01/18/2016   Left-sided low back pain with left-sided sciatica 10/07/2015   Obesity (BMI 30-39.9) 06/05/2015   OSA (obstructive sleep apnea) 02/02/2015   Palpitations 09/19/2014   Plantar fasciitis, bilateral 11/19/2013   Pronation deformity of ankle, acquired 11/19/2013   DJD (degenerative joint disease) 08/26/2013   Fibrocystic breast 08/26/2013   HTN (hypertension) 08/26/2013   GERD (gastroesophageal reflux disease) 08/26/2013   S/P hysterectomy with oophorectomy 08/26/2013   Hypothyroidism 08/26/2013   Chronic hoarseness 08/26/2013   Colon polyp 08/26/2013   Hiatal hernia 08/26/2013   Irritable bowel syndrome 08/26/2013   Moderate persistent asthma 08/26/2013   Allergic rhinoconjunctivitis 08/26/2013   Hyperlipidemia 08/22/2013   Migraine 08/22/2013   Thyroid cancer ([place]) 08/22/2013    PCP: [name] MD  REFERRING PROVIDER: Alric Ran, MD  REFERRING DIAG: R26.9 (ICD-10-CM) - Gait abnormality W19.XXXS (ICD-10-CM) - Fall, sequela  THERAPY DIAG:  Unsteadiness on feet  Muscle weakness (generalized)  Difficulty in walking, not elsewhere classified  ONSET DATE: ~1 month  SUBJECTIVE:   SUBJECTIVE STATEMENT: Pt reports that her heel has been aggravating her more than the knee.  PERTINENT HISTORY: asthma, hypertension,  thyroid disease and diabetes mellitus type 2, cervical radiculopathy, arthritis, history LBP  PAIN:  Are you having pain? 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     OBJECTIVE:   DIAGNOSTIC FINDINGS: MRI cervical spine was unchanged from prior and it showed moderate right-sided C3 thru C7 moderate foraminal stenosis, there were no spinal canal stenosis. MRI Lumbar spine 09/19/21 1. Moderate lower lumbar facet arthrosis, which may serve as a  source of local low back pain. 2. No spinal canal or neural foraminal stenosis. MRI Cervical spine 07/31/2021 1. Unchanged examination of the cervical spine with moderate right C3-4 and mild right C4-5 and left C6-7 neural foraminal stenosis. 2. No spinal canal stenosis   PATIENT SURVEYS:  ABC scale 67% (11/29/2021)   POSTURE:  Forward head posture  LE MMT:  MMT Right 11/24/2021 Left 11/24/2021  Hip flexion 4 5  Hip extension 4 4+  Hip abduction 5 5  Hip adduction 5 5  Knee flexion 5 5  Knee extension 5 5  Ankle dorsiflexion 5 5   (Blank rows = not tested)   FUNCTIONAL TESTS:  5 times sit to stand: 18.6 sec hands on thighs Tandem L foot forward 30 sec, R foot forward 10 sec  mCTSIB condition 1 30 sec, condition 2 30 sec, condition 3 30 sec, condition 4 30 sec but large sway.  DGI: 16/24 high risk for falls.    12/30/2021 DGI 21/24  GAIT: Distance walked: 150  Assistive device utilized: None Level of assistance: Complete Independence Comments:  ModI on stairs with decreased eccentric control R quad descending.  Gait Speed: 0.5 m/s 12/30/2021 - 600' before needed to stop due to R hip pain   TODAY'S TREATMENT: 02/07/22 Therapeutic Exercise: Nustep L5x52mn Standing hip march, abduction, extension 2x10 2# weight each Seated LAQ x 10 2# each  Manual Therapy: STM to L achilles insertion, lateral ankle  02/03/22 Therapeutic Exercise: Nustep L5x827m  Manual Therapy: STM to R patellar ligament, pes anserine PROM with slight knee distraction  Gait: Stairs: 7 inch, 52 stairs - decreased tolerance for eccentric loading of R knee - post MT 26 stairs with improved mechanics and tolerance for eccentric loading of R knee  01/27/22 Therapeutic Exercise: to improve strength and mobility.   Nustep L5 x 6 min  Heel/calf stretches Sit to stands x 5 - modified to minisquats x 10due to bil knee pain.  Neuromuscular Reeducation: to improve balance and stability. SBA for safety  throughout.  Forward gait with ball tos (small lob x 1 due to catching toe) Side stepping with ball toss Forward, side and back stepping strategy with arm movements x 10 each side and motion.  Star excursion pattern x 3 each leg 180 deg  01/24/22 Therapeutic Exercise: Recumbent Bike L2x6m29mFwd step downs 2x10 L LE Lat step up 2x10 R LE Heel raise with counter support 20 reps Toe raise with counter support 20 reps Standing 4 way hip with 2# weight 2x10 Standing gastroc stretch on step 2x30"  01/04/22 Therapeutic Exercise: to improve strength and mobility.  Demo, verbal and tactile cues throughout for technique. Gait x 5 min  Heel cord stretch 3 x 30 sec hold L Review of PF exercises  Manual Therapy: to decrease muscle spasm and pain and improve mobility IASTM to L plantar fascia and achilles insertion and STM/TPR  to gastrocs/soleus, calcaneal mobs, skilled palpation and monitoring during dry needling. Trigger Point Dry-Needling  Treatment instructions: Expect mild to moderate muscle soreness. Patient verbalized understanding of these instructions and education.  Patient Consent Given: Yes Education handout provided: Previously provided Muscles treated: L gastroc and soleus Treatment response/outcome: Twitch Response Elicited and Palpable Increase in Muscle Length   PATIENT EDUCATION:  Education details: education on ice massage and exercises for plantar fasciitis  Person educated: Patient Education method: Explanation Education comprehension: verbalized understanding   HOME EXERCISE PROGRAM: Access Code: [email or web address]  ASSESSMENT:  CLINICAL IMPRESSION:  Pt continues reporting nagging pain in L achilles but less in R knee today. MT was done to decrease tension along talocrual joint and reduce pain. Followed manual, with TE to work on proximal strengthening to improve stability. Pt reported that she would like to continue PT but to focus more on L heel and R knee. She plans on  going to Dr. Noemi Chapel to get referral and will plan on either recert or end current episode then start fresh with new referral.    GOALS: Goals reviewed with patient? Yes  SHORT TERM GOALS: Target date: 12/08/2021    Independent with initial HEP Baseline: Goal status: MET  2.  Complete ABC scale  Baseline:  Goal status: MET   LONG TERM GOALS: Target date: 01/05/2022  extended to 02/10/22   Ind with progresed HEP to improve outcomes.  Baseline:  Goal status: IN PROGRESS 12/30/21- met for current  2.  Be able to ascend/descend stairs with 1 HR and good eccentric control for safety.  Baseline:  Goal status: IN PROGRESS 02/03/22 - check treatment  3.  Be able to walk 15 minutes over even and uneven surfaces to return to work Baseline:  Goal status: IN PROGRESS 01/24/22-  pt reports being able to walk 15 min on even surface  4.  Be able to maintain tandem stance x 30 sec bil  Baseline: 30 sec L, 10 sec R   11/29/21- maintained 30 sec bil  Goal status: MET  5.  Improve functional strength by improving 5x STS score to <14 sec to decrease risk of falls Baseline: 18.6 sec  Goal status: MET (12/27/21)   6.  Improve DGI to >19/24 to decrease fall risk  Baseline: 16/24 Goal status: MET 12/30/21- 21/24   PLAN: PT FREQUENCY: 1-2x/week  PT DURATION: 6 weeks  PLANNED INTERVENTIONS: Therapeutic exercises, Therapeutic activity, Neuromuscular re-education, Balance training, Gait training, Patient/Family education, Joint mobilization, Stair training, Dry Needling, Cryotherapy, Moist heat, and Manual therapy  PLAN FOR NEXT SESSION: continue LE and balance exercises. steps for stair endurance.    Artist Pais, PTA  02/07/2022, 6:05 PM

## 2022-02-08 ENCOUNTER — Ambulatory Visit (INDEPENDENT_AMBULATORY_CARE_PROVIDER_SITE_OTHER): Payer: Medicare Other | Admitting: Family Medicine

## 2022-02-08 ENCOUNTER — Encounter: Payer: Self-pay | Admitting: Family Medicine

## 2022-02-08 VITALS — BP 132/70 | HR 82 | Resp 20 | Ht 62.0 in | Wt 207.6 lb

## 2022-02-08 DIAGNOSIS — E7800 Pure hypercholesterolemia, unspecified: Secondary | ICD-10-CM

## 2022-02-08 DIAGNOSIS — E119 Type 2 diabetes mellitus without complications: Secondary | ICD-10-CM | POA: Diagnosis not present

## 2022-02-08 DIAGNOSIS — G8929 Other chronic pain: Secondary | ICD-10-CM

## 2022-02-08 DIAGNOSIS — R609 Edema, unspecified: Secondary | ICD-10-CM | POA: Insufficient documentation

## 2022-02-08 DIAGNOSIS — E669 Obesity, unspecified: Secondary | ICD-10-CM

## 2022-02-08 DIAGNOSIS — M7121 Synovial cyst of popliteal space [Baker], right knee: Secondary | ICD-10-CM | POA: Diagnosis not present

## 2022-02-08 DIAGNOSIS — G4733 Obstructive sleep apnea (adult) (pediatric): Secondary | ICD-10-CM

## 2022-02-08 DIAGNOSIS — R42 Dizziness and giddiness: Secondary | ICD-10-CM | POA: Diagnosis not present

## 2022-02-08 DIAGNOSIS — I10 Essential (primary) hypertension: Secondary | ICD-10-CM

## 2022-02-08 DIAGNOSIS — M25572 Pain in left ankle and joints of left foot: Secondary | ICD-10-CM | POA: Diagnosis not present

## 2022-02-08 DIAGNOSIS — E890 Postprocedural hypothyroidism: Secondary | ICD-10-CM | POA: Diagnosis not present

## 2022-02-08 DIAGNOSIS — M25561 Pain in right knee: Secondary | ICD-10-CM

## 2022-02-08 LAB — COMPREHENSIVE METABOLIC PANEL
ALT: 18 U/L (ref 0–35)
AST: 22 U/L (ref 0–37)
Albumin: 4.2 g/dL (ref 3.5–5.2)
Alkaline Phosphatase: 77 U/L (ref 39–117)
BUN: 15 mg/dL (ref 6–23)
CO2: 32 mEq/L (ref 19–32)
Calcium: 9.7 mg/dL (ref 8.4–10.5)
Chloride: 97 mEq/L (ref 96–112)
Creatinine, Ser: 0.85 mg/dL (ref 0.40–1.20)
GFR: 68.18 mL/min (ref >60.00)
Glucose, Bld: 93 mg/dL (ref 70–99)
Potassium: 3.5 mEq/L (ref 3.5–5.1)
Sodium: 138 mEq/L (ref 135–145)
Total Bilirubin: 0.3 mg/dL (ref 0.2–1.2)
Total Protein: 7.1 g/dL (ref 6.0–8.3)

## 2022-02-08 LAB — CBC
HCT: 40.2 % (ref 36.0–46.0)
Hemoglobin: 13.1 g/dL (ref 12.0–15.0)
MCHC: 32.5 g/dL (ref 30.0–36.0)
MCV: 90.9 fl (ref 78.0–100.0)
Platelets: 306 10*3/uL (ref 150.0–400.0)
RBC: 4.42 Mil/uL (ref 3.87–5.11)
RDW: 13.5 % (ref 11.5–15.5)
WBC: 10.2 10*3/uL (ref 4.0–10.5)

## 2022-02-08 LAB — MICROALBUMIN / CREATININE URINE RATIO
Creatinine,U: 28.5 mg/dL
Microalb Creat Ratio: 2.5 mg/g (ref 0.0–30.0)
Microalb, Ur: <0.7 mg/dL (ref 0.0–1.9)

## 2022-02-08 LAB — LIPID PANEL
Cholesterol: 186 mg/dL (ref 0–200)
HDL: 54.4 mg/dL (ref >39.00)
LDL Cholesterol: 104 mg/dL — ABNORMAL HIGH (ref 0–99)
NonHDL: 131.94
Total CHOL/HDL Ratio: 3
Triglycerides: 139 mg/dL (ref 0.0–149.0)
VLDL: 27.8 mg/dL (ref 0.0–40.0)

## 2022-02-08 LAB — HEMOGLOBIN A1C: Hgb A1c MFr Bld: 6.5 % (ref 4.6–6.5)

## 2022-02-08 LAB — TSH: TSH: 0.54 u[IU]/mL (ref 0.35–5.50)

## 2022-02-08 NOTE — Patient Instructions (Signed)
Preventing Pulmonary Fibrosis Pulmonary fibrosis is scarring, stiffening, and thickening (fibrosis) that develops over time in lung tissue (pulmonary tissue). At first, this may cause shortness of breath and a dry cough. Scarring from pulmonary fibrosis is permanent and may lead to other serious health problems. There are many different causes of pulmonary fibrosis. In some cases, the cause is not known. You may not be able to entirely prevent pulmonary fibrosis, but you may be able to reduce your risk by: Avoiding pollutants. Protecting your lungs. Making lifestyle changes. Managing conditions that may lead to pulmonary fibrosis. How can pulmonary fibrosis affect me? Pulmonary fibrosis is a serious condition. There is no cure for pulmonary fibrosis. Once you have this condition, it tends to get worse (progress) gradually over several years and may lead to: Collapsed lung. Infection of your lungs (pneumonia). Blood clots in the lungs. Lung cancer. Inability of your heart to pump blood (heart failure). A condition in which the lungs cannot take in enough oxygen or remove excess carbon dioxide (respiratory failure). What actions can I take to prevent this condition? Avoid pollutants Protect yourself from breathing harmful dusts. Harmful dusts include grain, wood, soil, coal, silica, and asbestos. To protect yourself from these dusts: Know which types of dust you may be exposed to. Avoid working in dusty areas when possible. Work in an area with good air circulation (ventilation). Wet down work areas to reduce dust when possible. Use an approved respirator mask. Do not park your car near areas that may be affected by dust. Other ways to protect yourself include the following: Do not eat, drink, or take breaks in dusty areas. Wash your hands and face or shower after working in dusty areas. Change into clean clothes after working in dusty areas. Protect your lungs Take steps to protect  yourself from lung infections: Stay up to date on all vaccines. This includes the flu (influenza) and pneumonia (pneumococcal) vaccines. Avoid contact with people who are sick with flu or cold symptoms. Wash your hands with soap and water often for at least 20 seconds. If soap and water are not available, use hand sanitizer. Use a disinfectant to regularly clean surfaces at home and at work. Get 7-8 hours of sleep every night. Eat a healthy diet that includes plenty of fruits and vegetables, whole grains, and healthy sources of protein, such as poultry, fish, nuts, and low-fat dairy. Exercise at least 30 minutes most days of the week.  Make lifestyle changes  Quitting smoking can help reduce the risk of pulmonary fibrosis and other serious lung conditions. Do not use any products that contain nicotine or tobacco. These products include cigarettes, chewing tobacco, and vaping devices, such as e-cigarettes. If you need help quitting, ask your health care provider. Avoid secondhand smoke. General information If you have heartburn, or GERD, work with your health care provider to treat your condition. This may include taking medicines and making changes to your diet. Ask your health care provider whether your medicines put you at risk for pulmonary fibrosis. This is especially important if you have seizures, heart disease, or cancer. Talk with your health care provider about other treatments that may work for you. Where to find more information Learn more about pulmonary fibrosis from: American Lung Association: lung.org National Heart, Lung, and Blood Institute: https://www.[place]man-[place].biz/ Pulmonary Fibrosis Foundation: pulmonaryfibrosis.org Summary You may be able to lower your risk for pulmonary fibrosis by avoiding pollutants, protecting your lungs, making lifestyle changes, and managing conditions that may lead to pulmonary  fibrosis. Protect yourself from breathing harmful dusts. This includes dusts from  grain, wood, soil, coal, silica, and asbestos. Stay up to date on all vaccines. This includes the flu (influenza) and pneumonia (pneumococcal) vaccines. Quitting smoking can help reduce the risk of pulmonary fibrosis and other serious lung conditions. This information is not intended to replace advice given to you by your health care provider. Make sure you discuss any questions you have with your health care provider. Document Revised: 02/23/2021 Document Reviewed: 02/23/2021 Elsevier Patient Education  [place].

## 2022-02-08 NOTE — Assessment & Plan Note (Signed)
Encourage heart healthy diet such as MIND or DASH diet, increase exercise, avoid trans fats, simple carbohydrates and processed foods, consider a krill or fish or flaxseed oil cap daily.  °

## 2022-02-08 NOTE — Progress Notes (Signed)
Subjective:   By signing my name below, I, Monica Wallace, attest that this documentation has been prepared under the direction and in the presence of Monica Lukes, MD 02/08/2022.    Patient ID: Monica Wallace, female    DOB: [birth year], 71 y.o.   MRN: [id number]  No chief complaint on file.  HPI Patient is in today for an office visit.  She denies having any sinus pain, sore throat, chest pain, palpitations and wheezing at this time.   Right knee/ Left ankle pain: She complains that she been experiencing pain in her right knee and left ankle. She reports that she has been undergoing physical therapy and this has been doing well at managing her pain. She states that she keeps her right leg straight at night, as flexing it does cause pain. She also states that standing up after a prolonged period of sitting causes pain and there is no pattern to the pain. She has not used Neoprene to manager her knee pain.   Popliteal cyst: She complains of a baker cyst on her right knee. She states that her right knee was drained over 1 year ago. She reports also having a meniscal tear on her right knee.   Imbalance: She reports that she been experiencing imbalance. She states that she experienced an episode of imbalance on 02/05/2022 and that it improved on 02/06/2022. She denies vertigo, but does describe dizziness and light headedness. She has an upcoming appointment with her ENT doctor.   Furosemide: She is currently taking Furosemide 20 mg to manage her fluids. She reports that she only took this medication once last week.  Synthroid: She is currently taking Synthroid 137 mcg to manage her thyroid.  Lab Results  Component Value Date   TSH 0.07 (L) 11/09/2021   Weight: She states that she has been recording her weight at home and does want to lose weight.  Wt Readings from Last 3 Encounters:  02/08/22 207 lb 9.6 oz (94.2 kg)  12/20/21 205 lb (93 kg)  11/09/21 204 lb (92.5 kg)   Pulmonology:  She reports that she has been given a prognosis of pulmonary fibrosis by her pulmonologist. She states that she has a scheduled pulmonary function test.  CPAP: She states that she has used a CPAP once before but did not find it useful.  Blood sugar: She states that she is checking her blood sugar at home.  Lab Results  Component Value Date   HGBA1C 6.3 09/07/2021   Past Medical History:  Diagnosis Date   Arthritis    Asthma    Baker's cyst of knee, right 02/16/2017   Cancer ([place]) 2002   thyroid (2002)   Cystic breast    Diabetes ([place])    GERD (gastroesophageal reflux disease)    Hepatic cyst 02/02/2015   Hyperlipidemia    Hypertension    Insomnia 07/23/2016   Menopause    Migraine    Palpitations    Preventative health care 01/18/2016   Sleep apnea 02/02/2015   Thyroid disease     Past Surgical History:  Procedure Laterality Date   ABDOMINAL HYSTERECTOMY     took right ovary and uterus   APPENDECTOMY     CARPAL TUNNEL RELEASE Right    CESAREAN SECTION     CHOLECYSTECTOMY     EYE SURGERY Bilateral 2017   cataracts   KNEE ARTHROPLASTY     SHOULDER ARTHROSCOPY W/ ROTATOR CUFF REPAIR Left    THYROIDECTOMY  2002  Family History  Problem Relation Age of Onset   Arthritis Mother    Transient ischemic attack Mother    Hypertension Mother    Hyperlipidemia Mother    Diabetes Father    Heart disease Father    Arthritis Father    Kidney disease Father    Hypertension Father    Hyperlipidemia Father    Cancer Sister 33       pancreatic   Fibromyalgia Sister    Allergic rhinitis Sister    Asthma Maternal Grandfather        smoker   Depression Maternal Grandfather    Dementia Paternal Grandmother    Diabetes Paternal Grandfather    Hypertension Brother    Allergic rhinitis Brother    Scoliosis Daughter        had rods placed and removed   Arthritis Sister    Gout Sister    GER disease Sister    Cancer Sister        breast cancer   Breast cancer Sister  90    Social History   Socioeconomic History   Marital status: Divorced    Spouse name: Not on file   Number of children: 2   Years of education: Not on file   Highest education level: Not on file  Occupational History   Not on file  Tobacco Use   Smoking status: Never   Smokeless tobacco: Never  Vaping Use   Vaping Use: Never used  Substance and Sexual Activity   Alcohol use: Yes    Alcohol/week: 0.0 standard drinks of alcohol    Comment: very rare once or twice a year   Drug use: No   Sexual activity: Yes    Birth control/protection: Other-see comments, Surgical    Comment:  boyfriend  Other Topics Concern   Not on file  Social History Narrative   Household: pt, daughter and sister    Social Determinants of Health   Financial Resource Strain: Medium Risk (11/23/2021)   Overall Financial Resource Strain (CARDIA)    Difficulty of Paying Living Expenses: Somewhat hard  Food Insecurity: No Food Insecurity (06/03/2021)   Hunger Vital Sign    Worried About Running Out of Food in the Last Year: Never true    Ran Out of Food in the Last Year: Never true  Transportation Needs: No Transportation Needs (06/03/2021)   PRAPARE - Hydrologist (Medical): No    Lack of Transportation (Non-Medical): No  Physical Activity: Inactive (02/08/2021)   Exercise Vital Sign    Days of Exercise per Week: 0 days    Minutes of Exercise per Session: 0 min  Stress: No Stress Concern Present (02/08/2021)   [place]    Feeling of Stress : Not at all  Social Connections: Moderately Isolated (02/08/2021)   Social Connection and Isolation Panel [NHANES]    Frequency of Communication with Friends and Family: More than three times a week    Frequency of Social Gatherings with Friends and Family: More than three times a week    Attends Religious Services: More than 4 times per year    Active Member of [employer]  or Organizations: No    Attends Archivist Meetings: Never    Marital Status: Divorced  Human resources officer Violence: Not At Risk (02/08/2021)   Humiliation, Afraid, Rape, and Kick questionnaire    Fear of Current or Ex-Partner: No    Emotionally Abused:  No    Physically Abused: No    Sexually Abused: No    Outpatient Medications Prior to Visit  Medication Sig Dispense Refill   acetaminophen (TYLENOL) 325 MG tablet Take 650 mg by mouth every 6 (six) hours as needed.     albuterol (PROVENTIL) (2.5 MG/3ML) 0.083% nebulizer solution Take 3 mLs (2.5 mg total) by nebulization every 6 (six) hours as needed for wheezing or shortness of breath. 75 mL 0   albuterol (VENTOLIN HFA) 108 (90 Base) MCG/ACT inhaler INHALE 2 PUFFS INTO THE LUNGS EVERY 6 HOURS AS NEEDED FOR WHEEZING OR SHORTNESS OF BREATH 25.5 g 1   azelastine (ASTELIN) 0.1 % nasal spray Place 1 spray in each nostril twice a day as needed for runny nose/drainage down throat 30 mL 0   azelastine (ASTELIN) 0.1 % nasal spray 1-2 sprays per nostril twice daily for runny nose 30 mL 5   Blood Glucose Monitoring Suppl (ONETOUCH VERIO FLEX SYSTEM) w/Device KIT Use to check blood glucose once a day.  DX code: E11.9 1 kit 0   budesonide (PULMICORT FLEXHALER) 180 MCG/ACT inhaler 2 puffs twice daily to prevent coughing or wheezing. 1 each 5   Cholecalciferol ([employer] VIT D 5000 HIGH-POTENCY PO) Take 5,000 Units by mouth daily.     estradiol (ESTRACE VAGINAL) 0.1 MG/GM vaginal cream Place 1 Applicatorful vaginally at bedtime. 42.5 g 12   fluticasone (FLONASE) 50 MCG/ACT nasal spray SHAKE LIQUID AND USE 2 SPRAYS IN EACH NOSTRIL DAILY 48 g 1   fluticasone (FLONASE) 50 MCG/ACT nasal spray Place 2 sprays into both nostrils daily. 16 g 5   furosemide (LASIX) 20 MG tablet 1 tab by mouth daily as needed for edema, weight gain >3lbs in 24 hours 30 tablet 3   glucose blood (ONETOUCH VERIO) test strip TEST BLOOD GLUCOSE ONCE A DAY 100 strip 1   hyoscyamine  (LEVSIN SL) 0.125 MG SL tablet Place 1 tablet (0.125 mg total) under the tongue every 4 (four) hours as needed. 30 tablet 1   Lancets (ONETOUCH DELICA PLUS [id number]) MISC TEST BLOOD SUGAR ONCE A DAY 100 each 1   levocetirizine (XYZAL) 5 MG tablet Take 1 tablet (5 mg total) by mouth every evening. 30 tablet 5   levothyroxine (SYNTHROID) 137 MCG tablet Take 1 tablet (137 mcg total) by mouth daily before breakfast. NAME BRAND ONLY 90 tablet 1   montelukast (SINGULAIR) 10 MG tablet TAKE 1 TABLET(10 MG) BY MOUTH DAILY AS NEEDED (Patient taking differently: Take 10 mg by mouth daily. TAKE 1 TABLET(10 MG) BY MOUTH DAILY AS NEEDED) 90 tablet 1   Olopatadine HCl 0.2 % SOLN Place 1 drop in each eye once a day as needed for itchy watery eyes 2.5 mL 5   Omega-3 1000 MG CAPS Take 1,000 mg by mouth daily.     omeprazole (PRILOSEC) 40 MG capsule Take 1 capsule by mouth daily.     potassium chloride SA (KLOR-CON M) 20 MEQ tablet TAKE 2 TABLETS BY MOUTH DAILY FOR 3 DAYS. THEN TAKE 1 TABLET BY MOUTH DAILY. 30 tablet 3   rosuvastatin (CRESTOR) 5 MG tablet TAKE 1 TAKE ONE TABLET EVERY__ HOURS TAKE ONE TABLET EVERY__ HOURS TAB BY MOUTH 2 TIMES A WEEK(TUE AND SUN WEEKLY) 25 tablet 1   triamterene-hydrochlorothiazide (MAXZIDE-25) 37.5-25 MG tablet TAKE 1 TABLET BY MOUTH DAILY 90 tablet 1   No facility-administered medications prior to visit.    Allergies  Allergen Reactions   Levaquin [Levofloxacin] Shortness Of Breath   Penicillins  Other (See Comments)    Positive on allergy testing 'years ago.' Has never had penicillin. Has tolerated amoxicillin.   Clindamycin/Lincomycin Other (See Comments)    Pruritus No rash   Codeine Itching   Gabapentin Itching    Review of Systems  HENT:  Negative for sinus pain and sore throat.   Respiratory:  Negative for wheezing.   Cardiovascular:  Negative for chest pain and palpitations.  Skin:        (+) right popliteal cyst  Neurological:        (+) imbalance        Objective:    Physical Exam Constitutional:      General: She is not in acute distress.    Appearance: Normal appearance. She is not ill-appearing.  HENT:     Head: Normocephalic and atraumatic.     Right Ear: External ear normal.     Left Ear: External ear normal.  Eyes:     Extraocular Movements: Extraocular movements intact.     Pupils: Pupils are equal, round, and reactive to light.  Cardiovascular:     Rate and Rhythm: Normal rate and regular rhythm.     Pulses: Normal pulses.     Heart sounds: Normal heart sounds. No murmur heard.    No gallop.  Pulmonary:     Effort: Pulmonary effort is normal. No respiratory distress.     Breath sounds: Normal breath sounds. No wheezing or rales.  Abdominal:     General: Bowel sounds are normal.  Skin:    General: Skin is warm and dry.  Neurological:     Mental Status: She is alert and oriented to person, place, and time.  Psychiatric:        Mood and Affect: Mood normal.        Behavior: Behavior normal.        Judgment: Judgment normal.     There were no vitals taken for this visit. Wt Readings from Last 3 Encounters:  12/20/21 205 lb (93 kg)  11/09/21 204 lb (92.5 kg)  11/04/21 199 lb (90.3 kg)    Diabetic Foot Exam - Simple   No data filed    Lab Results  Component Value Date   WBC 15.4 (H) 11/09/2021   HGB 13.5 11/09/2021   HCT 42.4 11/09/2021   PLT 313.0 11/09/2021   GLUCOSE 107 (H) 11/17/2021   CHOL 199 03/02/2021   TRIG 164.0 (H) 03/02/2021   HDL 52.10 03/02/2021   LDLDIRECT 86.0 10/29/2020   LDLCALC 114 (H) 03/02/2021   ALT 27 11/17/2021   AST 22 11/17/2021   NA 139 11/17/2021   K 3.6 11/17/2021   CL 98 11/17/2021   CREATININE 0.87 11/17/2021   BUN 17 11/17/2021   CO2 30 11/17/2021   TSH 0.07 (L) 11/09/2021   INR 1.0 06/18/2021   HGBA1C 6.3 09/07/2021   Lab Results  Component Value Date   TSH 0.07 (L) 11/09/2021   Lab Results  Component Value Date   WBC 15.4 (H) 11/09/2021   HGB 13.5  11/09/2021   HCT 42.4 11/09/2021   MCV 92.3 11/09/2021   PLT 313.0 11/09/2021   Lab Results  Component Value Date   NA 139 11/17/2021   K 3.6 11/17/2021   CO2 30 11/17/2021   GLUCOSE 107 (H) 11/17/2021   BUN 17 11/17/2021   CREATININE 0.87 11/17/2021   BILITOT 0.3 11/17/2021   ALKPHOS 70 11/17/2021   AST 22 11/17/2021   ALT 27 11/17/2021  PROT 6.6 11/17/2021   ALBUMIN 3.9 11/17/2021   CALCIUM 9.4 11/17/2021   ANIONGAP 10 11/04/2021   EGFR 81 06/17/2021   GFR 66.41 11/17/2021   Lab Results  Component Value Date   CHOL 199 03/02/2021   Lab Results  Component Value Date   HDL 52.10 03/02/2021   Lab Results  Component Value Date   LDLCALC 114 (H) 03/02/2021   Lab Results  Component Value Date   TRIG 164.0 (H) 03/02/2021   Lab Results  Component Value Date   CHOLHDL 4 03/02/2021   Lab Results  Component Value Date   HGBA1C 6.3 09/07/2021      Assessment & Plan:   Problem List Items Addressed This Visit       Cardiovascular and Mediastinum   HTN (hypertension) - Primary     Endocrine   Hypothyroidism   Controlled type 2 diabetes mellitus without complication, without long-term current use of insulin ([place])     Other   Hyperlipidemia   No orders of the defined types were placed in this encounter.  I, Monica Wallace, personally preformed the services described in this documentation.  All medical record entries made by the scribe were at my direction and in my presence.  I have reviewed the chart and discharge instructions (if applicable) and agree that the record reflects my personal performance and is accurate and complete. 02/08/2022.  I,Mohammed Iqbal,acting as a scribe for [name], MD.,have documented all relevant documentation on the behalf of [name], MD,as directed by  [name], MD while in the presence of [name], MD.  Monica Wallace

## 2022-02-08 NOTE — Assessment & Plan Note (Signed)
hgba1c acceptable, minimize simple carbs. Increase exercise as tolerated. Continue current meds 

## 2022-02-08 NOTE — Assessment & Plan Note (Signed)
She is working with Dr Noemi Chapel but she continues to work on her knee. She is not yet a candidate for surgery but they have tried steroid injections with marginal improvement. Has had the cyst drained in past with adequate results. She is encouraged to reach out to ortho for consideration of recurrent draining.

## 2022-02-08 NOTE — Assessment & Plan Note (Signed)
Well controlled, no changes to meds. Encouraged heart healthy diet such as the DASH diet and exercise as tolerated.  °

## 2022-02-08 NOTE — Assessment & Plan Note (Signed)
Only used Lasix once last week due to edema. She watches her weight and takes as needed for weight and for pedal edema. Encouraged to use compression hose knee high light weight, on in am off in pm. Can use Lasix prn but sparingly

## 2022-02-08 NOTE — Assessment & Plan Note (Signed)
Consider dehydration as a cause. She typically has

## 2022-02-08 NOTE — Assessment & Plan Note (Signed)
Encouraged DASH or MIND diet, decrease po intake and increase exercise as tolerated. Needs 7-8 hours of sleep nightly. Avoid trans fats, eat small, frequent meals every 4-5 hours with lean proteins, complex carbs and healthy fats. Minimize simple carbs, high fat foods and processed foods 

## 2022-02-08 NOTE — Assessment & Plan Note (Signed)
Dr Gala Murdoch of pulmonary.

## 2022-02-09 DIAGNOSIS — J321 Chronic frontal sinusitis: Secondary | ICD-10-CM | POA: Diagnosis not present

## 2022-02-09 DIAGNOSIS — M25572 Pain in left ankle and joints of left foot: Secondary | ICD-10-CM | POA: Insufficient documentation

## 2022-02-09 DIAGNOSIS — J343 Hypertrophy of nasal turbinates: Secondary | ICD-10-CM | POA: Diagnosis not present

## 2022-02-09 DIAGNOSIS — J320 Chronic maxillary sinusitis: Secondary | ICD-10-CM | POA: Diagnosis not present

## 2022-02-09 NOTE — Assessment & Plan Note (Signed)
She will follow up with ortho and consider a compression brace

## 2022-02-09 NOTE — Assessment & Plan Note (Signed)
She is following with ortho and PT but pain persists.encouraged to stay as active as she is able and follow up with ortho

## 2022-02-09 NOTE — Assessment & Plan Note (Signed)
On Levothyroxine, continue to monitor 

## 2022-02-10 ENCOUNTER — Ambulatory Visit: Payer: Medicare Other | Admitting: Physical Therapy

## 2022-02-10 ENCOUNTER — Encounter: Payer: Self-pay | Admitting: Physical Therapy

## 2022-02-10 DIAGNOSIS — R2681 Unsteadiness on feet: Secondary | ICD-10-CM

## 2022-02-10 DIAGNOSIS — M6281 Muscle weakness (generalized): Secondary | ICD-10-CM | POA: Diagnosis not present

## 2022-02-10 DIAGNOSIS — R262 Difficulty in walking, not elsewhere classified: Secondary | ICD-10-CM

## 2022-02-10 NOTE — Therapy (Signed)
OUTPATIENT PHYSICAL THERAPY  Treatment Note  PHYSICAL THERAPY DISCHARGE SUMMARY  Visits from Start of Care: 16  Current functional level related to goals / functional outcomes: Improved balance, LE strength.  DGI improved from 16/24 to 21/24.   ABC score improved from 67% to 76%.    Remaining deficits: L knee pain limiting eccentric control on stairs.    Education / Equipment: HEP  Plan: Patient agrees to discharge.  Patient is being discharged due to meeting the stated rehab goals.           Patient Name: Monica Wallace MRN: [id number] DOB:1951-06-26, 71 y.o., female Today's Date: 02/10/2022   PT End of Session - 02/10/22 1659     Visit Number 16    Number of Visits 16    Date for PT Re-Evaluation 02/10/22    Authorization Type Medicare & AARP    Progress Note Due on Visit 75    PT Start Time 1700    PT Stop Time 1745    PT Time Calculation (min) 45 min    Activity Tolerance Patient tolerated treatment well    Behavior During Therapy [facility] for tasks assessed/performed                Rationale for Evaluation and Treatment Rehabilitation     Past Medical History:  Diagnosis Date   Arthritis    Asthma    Baker's cyst of knee, right 02/16/2017   Cancer ([place]) 2002   thyroid (2002)   Cystic breast    Diabetes ([place])    GERD (gastroesophageal reflux disease)    Hepatic cyst 02/02/2015   Hyperlipidemia    Hypertension    Insomnia 07/23/2016   Menopause    Migraine    Palpitations    Preventative health care 01/18/2016   Sleep apnea 02/02/2015   Thyroid disease    Past Surgical History:  Procedure Laterality Date   ABDOMINAL HYSTERECTOMY     took right ovary and uterus   APPENDECTOMY     CARPAL TUNNEL RELEASE Right    CESAREAN SECTION     CHOLECYSTECTOMY     EYE SURGERY Bilateral 2017   cataracts   KNEE ARTHROPLASTY     SHOULDER ARTHROSCOPY W/ ROTATOR CUFF REPAIR Left    THYROIDECTOMY  2002   Patient Active Problem List   Diagnosis Date  Noted   Left ankle pain 02/09/2022   Dizziness 02/08/2022   Edema 02/08/2022   Unsteady gait 11/14/2021   Lumbar radiculopathy 09/09/2021   Cervical cancer screening 03/07/2021   Allergic conjunctivitis 04/08/2020   Neck pain 02/06/2020   Right knee pain 09/30/2019   COVID-19 09/30/2019   Educated about COVID-19 virus infection 12/05/2018   Baker's cyst of knee, right 02/16/2017   Insomnia 07/23/2016   Hypokalemia 07/23/2016   Controlled type 2 diabetes mellitus without complication, without long-term current use of insulin ([place]) 01/24/2016   Preventative health care 01/18/2016   Left-sided low back pain with left-sided sciatica 10/07/2015   Obesity (BMI 30-39.9) 06/05/2015   OSA (obstructive sleep apnea) 02/02/2015   Palpitations 09/19/2014   Plantar fasciitis, bilateral 11/19/2013   Pronation deformity of ankle, acquired 11/19/2013   DJD (degenerative joint disease) 08/26/2013   Fibrocystic breast 08/26/2013   HTN (hypertension) 08/26/2013   GERD (gastroesophageal reflux disease) 08/26/2013   S/P hysterectomy with oophorectomy 08/26/2013   Hypothyroidism 08/26/2013   Chronic hoarseness 08/26/2013   Colon polyp 08/26/2013   Hiatal hernia 08/26/2013   Irritable bowel syndrome 08/26/2013  Moderate persistent asthma 08/26/2013   Allergic rhinoconjunctivitis 08/26/2013   Hyperlipidemia 08/22/2013   Migraine 08/22/2013   Thyroid cancer ([place]) 08/22/2013    PCP: [name] MD  REFERRING PROVIDER: Alric Ran, MD  REFERRING DIAG: R26.9 (ICD-10-CM) - Gait abnormality [phone] (ICD-10-CM) - Fall, sequela  THERAPY DIAG:  Unsteadiness on feet  Muscle weakness (generalized)  Difficulty in walking, not elsewhere classified  ONSET DATE: ~1 month  SUBJECTIVE:   SUBJECTIVE STATEMENT: Pt reports that her heel has been aggravating her more than the knee.  PERTINENT HISTORY: asthma, hypertension, thyroid disease and diabetes mellitus type 2, cervical  radiculopathy, arthritis, history LBP  PAIN:  Are you having pain? 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     OBJECTIVE:   DIAGNOSTIC FINDINGS: MRI cervical spine was unchanged from prior and it showed moderate right-sided C3 thru C7 moderate foraminal stenosis, there were no spinal canal stenosis. MRI Lumbar spine 09/19/21 1. Moderate lower lumbar facet arthrosis, which may serve as a source of local low back pain. 2. No spinal canal or neural foraminal stenosis. MRI Cervical spine 07/31/2021 1. Unchanged examination of the cervical spine with moderate right C3-4 and mild right C4-5 and left C6-7 neural foraminal stenosis. 2. No spinal canal stenosis   PATIENT SURVEYS:  ABC scale 67% (11/29/2021); 76% (02/10/22)    POSTURE:  Forward head posture  LE MMT:  MMT Right 11/24/2021 Left 11/24/2021 Right 02/10/22 Left 02/10/22  Hip flexion _0 Hip extension 4 4+ 5 5  Hip abduction 5 5    Hip adduction 5 5    Knee flexion 5 5    Knee extension 5 5    Ankle dorsiflexion 5 5     (Blank rows = not tested)   FUNCTIONAL TESTS:  5 times sit to stand: 18.6 sec hands on thighs Tandem L foot forward 30 sec, R foot forward 10 sec  mCTSIB condition 1 30 sec, condition 2 30 sec, condition 3 30 sec, condition 4 30 sec but large sway.  DGI: 16/24 high risk for falls.    12/30/2021 DGI 21/24  GAIT: Distance walked: 150  Assistive device utilized: None Level of assistance: Complete Independence Comments:  ModI on stairs with decreased eccentric control R quad descending.  Gait Speed: 0.5 m/s 12/30/2021 - 600' before needed to stop due to R hip pain   TODAY'S TREATMENT: 02/10/22 Therapeutic Exercise: to improve strength and mobility.   Nustep L5 x 6 min Runners stretch 2 x 15 sec  bil Stairs x 14 reciprocal step and HR SLR 2 x 10 bil  S/L hip abduction 2 x 10 bil  S/l clams 2 x 10 bil  Hip extension x 10 bil bil  Bridges 2 x 10  Hamstring curls on ball x 10  Tried bridge on bosu, stopped due to HS spasm.  Manual Therapy: to decrease muscle spasm and pain and improve mobility.  IASTM/ STM/TPR to L lateral hamstring.     02/07/22 Therapeutic Exercise: Nustep L5x63mn Standing hip march, abduction, extension 2x10 2# weight each Seated LAQ x 10 2# each  Manual Therapy: STM to L achilles insertion, lateral ankle  02/03/22 Therapeutic Exercise: Nustep L5x831m  Manual Therapy: STM to R patellar ligament, pes anserine PROM with  slight knee distraction  Gait: Stairs: 7 inch, 52 stairs - decreased tolerance for eccentric loading of R knee - post MT 26 stairs with improved mechanics and tolerance for eccentric loading of R knee  01/27/22 Therapeutic Exercise: to improve strength and mobility.   Nustep L5 x 6 min  Heel/calf stretches Sit to stands x 5 - modified to minisquats x 10due to bil knee pain.  Neuromuscular Reeducation: to improve balance and stability. SBA for safety throughout.  Forward gait with ball tos (small lob x 1 due to catching toe) Side stepping with ball toss Forward, side and back stepping strategy with arm movements x 10 each side and motion.  Star excursion pattern x 3 each leg 180 deg  01/24/22 Therapeutic Exercise: Recumbent Bike L2x37mn Fwd step downs 2x10 L LE Lat step up 2x10 R LE Heel raise with counter support 20 reps Toe raise with counter support 20 reps Standing 4 way hip with 2# weight 2x10 Standing gastroc stretch on step 2x30"  01/04/22 Therapeutic Exercise: to improve strength and mobility.  Demo, verbal and tactile cues throughout for technique. Gait x 5 min  Heel cord stretch 3 x 30 sec hold L Review of PF exercises  Manual Therapy: to decrease muscle spasm and pain and improve mobility IASTM to L plantar fascia  and achilles insertion and STM/TPR to gastrocs/soleus, calcaneal mobs, skilled palpation and monitoring during dry needling. Trigger Point Dry-Needling  Treatment instructions: Expect mild to moderate muscle soreness. Patient verbalized understanding of these instructions and education.  Patient Consent Given: Yes Education handout provided: Previously provided Muscles treated: L gastroc and soleus Treatment response/outcome: Twitch Response Elicited and Palpable Increase in Muscle Length   PATIENT EDUCATION:  Education details: education on ice massage and exercises for plantar fasciitis  Person educated: Patient Education method: Explanation Education comprehension: verbalized understanding   HOME EXERCISE PROGRAM: Access Code: [email or web address] ASSESSMENT:  CLINICAL IMPRESSION:  Monica DEJAGERhas met or almost met all goals.  She is mostly limited now not by weakness or balance, but by L knee pain and L heel pain.  She is returning to orthopedist next week and will discuss if referral for PT to address these concerns is appropriate. She has returned to work and reports no new falls or LOB.  ABC has improved to 76% from 67% demonstrating improved physical functioning. Today focused on general hip strengthening, had L hamstring spasm, no pain after manual therapy.  She is ready and agreeable to discharge today.      GOALS: Goals reviewed with patient? Yes  SHORT TERM GOALS: Target date: 12/08/2021    Independent with initial HEP Baseline: Goal status: MET  2.  Complete ABC scale  Baseline:  Goal status: MET   LONG TERM GOALS: Target date: 01/05/2022  extended to 02/10/22   Ind with progresed HEP to improve outcomes.  Baseline:  Goal status: MET   2.  Be able to ascend/descend stairs with 1 HR and good eccentric control for safety.  Baseline:  Goal status: Partially MET  poor eccentric control on L, however reports L knee pain limiting, rather than weakness.   3.  Be able to  walk 15 minutes over even and uneven surfaces to return to work Baseline:  Goal status: MET   4.  Be able to maintain tandem stance x 30 sec bil  Baseline: 30 sec L, 10 sec R    Goal status: MET  5.  Improve functional strength by improving 5x STS  score to <14 sec to decrease risk of falls Baseline: 18.6 sec  Goal status: MET   6.  Improve DGI to >19/24 to decrease fall risk  Baseline: 16/24 Goal status: MET  21/24   PLAN: PT FREQUENCY: 1-2x/week  PT DURATION: 6 weeks  PLANNED INTERVENTIONS: Therapeutic exercises, Therapeutic activity, Neuromuscular re-education, Balance training, Gait training, Patient/Family education, Joint mobilization, Stair training, Dry Needling, Cryotherapy, Moist heat, and Manual therapy  PLAN FOR NEXT SESSION: discharge to [place], PT , DPT  02/10/2022, 5:54 PM

## 2022-02-11 ENCOUNTER — Ambulatory Visit: Payer: Medicare Other

## 2022-02-11 NOTE — Patient Instructions (Signed)
Visit Information  Thank you for allowing me to share the care management and care coordination services that are available to you as part of your health plan and services through your primary care provider and medical home. Please reach out to your primary care provider or me at [phone] if the care management/care coordination team may be of assistance to you in the future.   Javien Tesch, RN, MSN, BSN, CCM Care Management Coordinator [facility] [phone]  

## 2022-02-11 NOTE — Chronic Care Management (AMB) (Signed)
Care Management    RN Visit Note  02/11/2022 Name: Monica Wallace MRN: [id number] DOB: 03-30-1951  Subjective: Monica Wallace is a 71 y.o. year old female who is a primary care patient of Monica Lukes, MD. The care management team was consulted for assistance with disease management and care coordination needs.    Engaged with patient by telephone for follow up visit in response to provider referral for case management and/or care coordination services.   Consent to Services:   Monica Wallace was given information about Care Management services today including:  Care Management services includes personalized support from designated clinical staff supervised by her physician, including individualized plan of care and coordination with other care providers 24/7 contact phone numbers for assistance for urgent and routine care needs. The patient may stop case management services at any time by phone call to the office staff.  Patient agreed to services and consent obtained.   Assessment: Review of patient past medical history, allergies, medications, health status, including review of consultants reports, laboratory and other test data, was performed as part of comprehensive evaluation and provision of chronic care management services.   SDOH (Social Determinants of Health) assessments and interventions performed:    Care Plan  Allergies  Allergen Reactions   Levaquin [Levofloxacin] Shortness Of Breath   Penicillins Other (See Comments)    Positive on allergy testing 'years ago.' Has never had penicillin. Has tolerated amoxicillin.   Clindamycin/Lincomycin Other (See Comments)    Pruritus No rash   Codeine Itching   Gabapentin Itching    Outpatient Encounter Medications as of 02/11/2022  Medication Sig Note   acetaminophen (TYLENOL) 325 MG tablet Take 650 mg by mouth every 6 (six) hours as needed.    albuterol (PROVENTIL) (2.5 MG/3ML) 0.083% nebulizer solution Take 3 mLs (2.5 mg  total) by nebulization every 6 (six) hours as needed for wheezing or shortness of breath.    albuterol (VENTOLIN HFA) 108 (90 Base) MCG/ACT inhaler INHALE 2 PUFFS INTO THE LUNGS EVERY 6 HOURS AS NEEDED FOR WHEEZING OR SHORTNESS OF BREATH    azelastine (ASTELIN) 0.1 % nasal spray 1-2 sprays per nostril twice daily for runny nose    Blood Glucose Monitoring Suppl (ONETOUCH VERIO FLEX SYSTEM) w/Device KIT Use to check blood glucose once a day.  DX code: E11.9    budesonide (PULMICORT FLEXHALER) 180 MCG/ACT inhaler 2 puffs twice daily to prevent coughing or wheezing.    Cholecalciferol ([employer] VIT D 5000 HIGH-POTENCY PO) Take 5,000 Units by mouth daily.    estradiol (ESTRACE VAGINAL) 0.1 MG/GM vaginal cream Place 1 Applicatorful vaginally at bedtime.    fluticasone (FLONASE) 50 MCG/ACT nasal spray SHAKE LIQUID AND USE 2 SPRAYS IN EACH NOSTRIL DAILY    fluticasone (FLONASE) 50 MCG/ACT nasal spray Place 2 sprays into both nostrils daily.    furosemide (LASIX) 20 MG tablet 1 tab by mouth daily as needed for edema, weight gain >3lbs in 24 hours    glucose blood (ONETOUCH VERIO) test strip TEST BLOOD GLUCOSE ONCE A DAY    hyoscyamine (LEVSIN SL) 0.125 MG SL tablet Place 1 tablet (0.125 mg total) under the tongue every 4 (four) hours as needed.    Lancets (ONETOUCH DELICA PLUS [id number]) MISC TEST BLOOD SUGAR ONCE A DAY    levocetirizine (XYZAL) 5 MG tablet Take 1 tablet (5 mg total) by mouth every evening.    levothyroxine (SYNTHROID) 137 MCG tablet Take 1 tablet (137 mcg total) by mouth daily  before breakfast. NAME BRAND ONLY 11/23/2021: Hasn't started new dose yet   montelukast (SINGULAIR) 10 MG tablet TAKE 1 TABLET(10 MG) BY MOUTH DAILY AS NEEDED (Patient taking differently: Take 10 mg by mouth daily. TAKE 1 TABLET(10 MG) BY MOUTH DAILY AS NEEDED) 06/03/2021: Reports takes every night   Olopatadine HCl 0.2 % SOLN Place 1 drop in each eye once a day as needed for itchy watery eyes    Omega-3 1000 MG CAPS Take  1,000 mg by mouth daily.    omeprazole (PRILOSEC) 40 MG capsule Take 1 capsule by mouth daily.    potassium chloride SA (KLOR-CON M) 20 MEQ tablet TAKE 2 TABLETS BY MOUTH DAILY FOR 3 DAYS. THEN TAKE 1 TABLET BY MOUTH DAILY.    rosuvastatin (CRESTOR) 5 MG tablet TAKE 1 TAKE ONE TABLET EVERY__ HOURS TAKE ONE TABLET EVERY__ HOURS TAB BY MOUTH 2 TIMES A WEEK(TUE AND SUN WEEKLY)    triamterene-hydrochlorothiazide (MAXZIDE-25) 37.5-25 MG tablet TAKE 1 TABLET BY MOUTH DAILY    No facility-administered encounter medications on file as of 02/11/2022.    Patient Active Problem List   Diagnosis Date Noted   Left ankle pain 02/09/2022   Dizziness 02/08/2022   Edema 02/08/2022   Unsteady gait 11/14/2021   Lumbar radiculopathy 09/09/2021   Cervical cancer screening 03/07/2021   Allergic conjunctivitis 04/08/2020   Neck pain 02/06/2020   Right knee pain 09/30/2019   COVID-19 09/30/2019   Educated about COVID-19 virus infection 12/05/2018   Baker's cyst of knee, right 02/16/2017   Insomnia 07/23/2016   Hypokalemia 07/23/2016   Controlled type 2 diabetes mellitus without complication, without long-term current use of insulin ([place]) 01/24/2016   Preventative health care 01/18/2016   Left-sided low back pain with left-sided sciatica 10/07/2015   Obesity (BMI 30-39.9) 06/05/2015   OSA (obstructive sleep apnea) 02/02/2015   Palpitations 09/19/2014   Plantar fasciitis, bilateral 11/19/2013   Pronation deformity of ankle, acquired 11/19/2013   DJD (degenerative joint disease) 08/26/2013   Fibrocystic breast 08/26/2013   HTN (hypertension) 08/26/2013   GERD (gastroesophageal reflux disease) 08/26/2013   S/P hysterectomy with oophorectomy 08/26/2013   Hypothyroidism 08/26/2013   Chronic hoarseness 08/26/2013   Colon polyp 08/26/2013   Hiatal hernia 08/26/2013   Irritable bowel syndrome 08/26/2013   Moderate persistent asthma 08/26/2013   Allergic rhinoconjunctivitis 08/26/2013   Hyperlipidemia  08/22/2013   Migraine 08/22/2013   Thyroid cancer (Monica Wallace) 08/22/2013    Conditions to be addressed/monitored: HTN, HLD, Asthma, and lower extremity weakness  Care Plan : RN Care Manager Plan of Care  Updates made by Luretha Rued, RN since 02/11/2022 12:00 AM  Completed 02/11/2022   Problem: Chronic Disease Managment education and/or Care Coordination Needs(HTN,HLD) Resolved 02/11/2022  Priority: High     Long-Range Goal: Development of plan of care for Chronic Disease Managment (HTN/HLD) Completed 02/11/2022  Start Date: 06/03/2021  Expected End Date: 05/28/2022  Priority: High  Note:   Goals met: Case closed Current Barriers:  02/11/22 RNCM spoke with patient. She continues to work. Last office visit with primary care provider 02/08/22. She reports blood pressure controlled. She continues to attend provider visits as scheduled. Taking medications as prescribed. She is actively engaged with outpatient rehab. Case Closure discussed. RNCM encouraged Ms. Lacson to contact primary care provider if any care management/care coordination needs in the future. Ms. Ballester voiced understanding.  Knowledge Deficits related to plan of care for management of HTN, HLD, and Asthma   RNCM Clinical Goal(s):  Patient will  verbalize understanding of plan for management of HTN, HLD, and Asthma as evidenced by voicing understanding, taking medications as prescribed, attending provider visits as scheduled, notifying provider with questions and concerns, self report and/or chart notation take all medications exactly as prescribed and will call provider for medication related questions as evidenced by self report or as noted in chart    through collaboration with RN Care manager, provider, and care team.   Interventions: 1:1 collaboration with primary care provider regarding development and update of comprehensive plan of care as evidenced by provider attestation and co-signature Inter-disciplinary care team  collaboration (see longitudinal plan of care) Evaluation of current treatment plan related to  self management and patient's adherence to plan as established by provider  Lower Extremity weakness interventions  (Status:  Goal Met.)  Long Term Goal Engaged with outpatient rehab Advised patient to maintain fall prevention strategies   Encouraged to continue to attend outpatient rehab as scheduled  Asthma Interventions: (Status:Goal Met.) Long Term Goal Advised patient to track and manage Asthma triggers  Encouraged to continue to take medications as prescribed Encouraged to continue to attend appointments as scheduled  Hypertension Interventions: (Status:Goal Met.) Long Term Goal Last practice recorded BP readings:  BP Readings from Last 3 Encounters:  02/08/22 132/70  12/21/21 140/78  12/20/21 120/78  Most recent eGFR/CrCl:  Lab Results  Component Value Date   EGFR 81 06/17/2021    No components found for: CRCL  Encouraged to contact provider with any questions or concerns Encouraged to continue to take medications as prescribed  Hyperlipidemia Interventions: (Status: Goal Met) Long Term Goal Attends provider visits as scheduled, encouraged to continue to minimize trans and saturated fats Reviewed medications and encouraged to take as prescribed  Encouraged to continue to eat healthy   Encouraged to attend provider appointments as scheduled  Patient Goals/Self-Care Activities: continue to check blood pressure routinely and notify provider if outside recommended parameters continue to keep all doctor appointments Continue to take medications as prescribed  Continue to attend rehab therapy sessions and perform exercise per therapist recommendation Continue to eat more whole grains, fruits and vegetables, lean meats and healthy fats   Plan: No further follow up required: Patietn encouraged to contact primary care provider if care management and/or care coordination needs in the  future.  Thea Silversmith, RN, MSN, BSN, CCM Care Management Coordinator [facility] [phone]

## 2022-02-13 ENCOUNTER — Emergency Department (HOSPITAL_BASED_OUTPATIENT_CLINIC_OR_DEPARTMENT_OTHER): Payer: Medicare Other

## 2022-02-13 ENCOUNTER — Other Ambulatory Visit: Payer: Self-pay

## 2022-02-13 ENCOUNTER — Emergency Department (HOSPITAL_BASED_OUTPATIENT_CLINIC_OR_DEPARTMENT_OTHER)
Admission: EM | Admit: 2022-02-13 | Discharge: 2022-02-13 | Disposition: A | Discharge status: Home / Self Care | Payer: Medicare Other | Attending: Emergency Medicine | Admitting: Emergency Medicine

## 2022-02-13 ENCOUNTER — Encounter (HOSPITAL_BASED_OUTPATIENT_CLINIC_OR_DEPARTMENT_OTHER): Payer: Self-pay | Admitting: Emergency Medicine

## 2022-02-13 DIAGNOSIS — M79661 Pain in right lower leg: Secondary | ICD-10-CM | POA: Insufficient documentation

## 2022-02-13 DIAGNOSIS — M7121 Synovial cyst of popliteal space [Baker], right knee: Secondary | ICD-10-CM | POA: Diagnosis not present

## 2022-02-13 NOTE — ED Triage Notes (Signed)
Pt arrives pov, limping gait, c/o right side hip pain, and RLE pain with calf pain x 2 days. Denies injury or CP, denies shob, endorses concern for DVT

## 2022-02-13 NOTE — ED Provider Notes (Signed)
[facility] EMERGENCY DEPARTMENT Provider Note   CSN: [id number] Arrival date & time: 02/13/22  1556     History  Chief Complaint  Patient presents with   Leg Pain    Monica Wallace is a 71 y.o. female.  Patient presents with a chief complaint right calf pain and swelling.  She states that the pain began over the past 2 days.  She denies any chest pain, shortness of breath.  She has some mild concern over DVT.  She states she has some radiation of the pain from the calf up towards the right hip but is not concerned about the radiation as she is followed by both orthopedics and neurosurgery.  Patient with past medical history significant for Baker's cyst of right knee, GERD, diabetes, thyroid disease, arthritis  HPI     Home Medications Prior to Admission medications   Medication Sig Start Date End Date Taking? Authorizing Provider  acetaminophen (TYLENOL) 325 MG tablet Take 650 mg by mouth every 6 (six) hours as needed.    [provider]  albuterol (PROVENTIL) (2.5 MG/3ML) 0.083% nebulizer solution Take 3 mLs (2.5 mg total) by nebulization every 6 (six) hours as needed for wheezing or shortness of breath. 11/04/21   Drenda Freeze, MD  albuterol (VENTOLIN HFA) 108 (90 Base) MCG/ACT inhaler INHALE 2 PUFFS INTO THE LUNGS EVERY 6 HOURS AS NEEDED FOR WHEEZING OR SHORTNESS OF BREATH 10/01/19   [name], MD  azelastine (ASTELIN) 0.1 % nasal spray 1-2 sprays per nostril twice daily for runny nose 12/21/21   Althea Charon, FNP  Blood Glucose Monitoring Suppl ([place]) w/Device KIT Use to check blood glucose once a day.  DX code: E11.9 02/06/20   [name], MD  budesonide (PULMICORT FLEXHALER) 180 MCG/ACT inhaler 2 puffs twice daily to prevent coughing or wheezing. 12/21/21   Althea Charon, FNP  Cholecalciferol (CVS VIT D 5000 HIGH-POTENCY PO) Take 5,000 Units by mouth daily.    [provider]  estradiol (ESTRACE VAGINAL) 0.1 MG/GM  vaginal cream Place 1 Applicatorful vaginally at bedtime. 04/28/21   Lavonia Drafts, MD  fluticasone (FLONASE) 50 MCG/ACT nasal spray SHAKE LIQUID AND USE 2 SPRAYS IN [facility] NOSTRIL DAILY 12/14/21   [name], MD  fluticasone (FLONASE) 50 MCG/ACT nasal spray Place 2 sprays into both nostrils daily. 12/21/21   Althea Charon, FNP  furosemide (LASIX) 20 MG tablet 1 tab by mouth daily as needed for edema, weight gain >3lbs in 24 hours 01/06/22   [name], MD  glucose blood (ONETOUCH VERIO) test strip TEST BLOOD GLUCOSE ONCE A DAY 11/05/21   [name], MD  hyoscyamine (LEVSIN SL) 0.125 MG SL tablet Place 1 tablet (0.125 mg total) under the tongue every 4 (four) hours as needed. 03/02/21   [name], MD  Lancets [facility] DELICA PLUS [id number]) [place] TEST BLOOD SUGAR ONCE A DAY 11/05/21   [name], MD  levocetirizine (XYZAL) 5 MG tablet Take 1 tablet (5 mg total) by mouth every evening. 12/21/21   Althea Charon, FNP  levothyroxine (SYNTHROID) 137 MCG tablet Take 1 tablet (137 mcg total) by mouth daily before breakfast. NAME BRAND ONLY 11/23/21   [name], MD  montelukast (SINGULAIR) 10 MG tablet TAKE 1 TABLET(10 MG) BY MOUTH DAILY AS NEEDED Patient taking differently: Take 10 mg by mouth daily. TAKE 1 TABLET(10 MG) BY MOUTH DAILY AS NEEDED 01/26/21   Althea Charon, FNP  Olopatadine HCl  0.2 % SOLN Place 1 drop in each eye once a day as needed for itchy watery eyes 12/21/21   Althea Charon, FNP  Omega-3 1000 MG CAPS Take 1,000 mg by mouth daily.    [provider]  omeprazole (PRILOSEC) 40 MG capsule Take 1 capsule by mouth daily. 10/28/20   [provider]  potassium chloride SA (KLOR-CON M) 20 MEQ tablet TAKE 2 TABLETS BY MOUTH DAILY FOR 3 DAYS. THEN TAKE 1 TABLET BY MOUTH DAILY. 11/10/21   [name], MD  rosuvastatin (CRESTOR) 5 MG tablet TAKE 1 TAKE ONE TABLET EVERY__ HOURS TAKE ONE TABLET EVERY__ HOURS TAB BY MOUTH 2 TIMES A WEEK(TUE AND SUN  WEEKLY) 08/23/21   [name], MD  triamterene-hydrochlorothiazide (MAXZIDE-25) 37.5-25 MG tablet TAKE 1 TABLET BY MOUTH DAILY 02/07/22   [name], MD      Allergies    Levaquin [levofloxacin], Penicillins, Clindamycin/lincomycin, Codeine, and Gabapentin    Review of Systems   Review of Systems  Musculoskeletal:        Right calf pain and swelling    Physical Exam Updated Vital Signs BP (!) 155/89 (BP Location: Left Arm)   Pulse 92   Temp 98.8 F (37.1 C) (Oral)   Resp 17   Ht 5' 2"  (1.575 m)   Wt 93 kg   SpO2 100%   BMI 37.49 kg/m  Physical Exam Vitals and nursing note reviewed.  Constitutional:      General: She is not in acute distress.    Appearance: She is obese.  HENT:     Head: Normocephalic and atraumatic.     Mouth/Throat:     Mouth: Mucous membranes are moist.  Eyes:     Conjunctiva/sclera: Conjunctivae normal.  Cardiovascular:     Rate and Rhythm: Normal rate and regular rhythm.     Pulses: Normal pulses.     Heart sounds: Normal heart sounds.  Pulmonary:     Effort: Pulmonary effort is normal.     Breath sounds: Normal breath sounds.  Musculoskeletal:        General: Swelling (Mild right calf swelling and tenderness) and tenderness present. Normal range of motion.     Cervical back: Normal range of motion and neck supple.  Skin:    General: Skin is warm and dry.     Capillary Refill: Capillary refill takes less than 2 seconds.  Neurological:     Mental Status: She is alert and oriented to person, place, and time.     ED Results / Procedures / Treatments   Labs (all labs ordered are listed, but only abnormal results are displayed) Labs Reviewed - No data to display  EKG None  Radiology US Venous Img Lower Unilateral Right (DVT)  Result Date: 02/13/2022 CLINICAL DATA:  Right calf pain for 3 days. EXAM: RIGHT LOWER EXTREMITY VENOUS DOPPLER ULTRASOUND TECHNIQUE: Gray-scale sonography with compression, as well as color and duplex  ultrasound, were performed to evaluate the deep venous system(s) from the level of the common femoral vein through the popliteal and proximal calf veins. COMPARISON:  Right lower extremity venous ultrasound 11/04/2019. FINDINGS: VENOUS Normal compressibility of the common femoral, superficial femoral, and popliteal veins, as well as the visualized calf veins. Visualized portions of profunda femoral vein and great saphenous vein unremarkable. No filling defects to suggest DVT on grayscale or color Doppler imaging. Doppler waveforms show normal direction of venous flow, normal respiratory plasticity and response to augmentation. Limited views of the contralateral  common femoral vein are unremarkable. OTHER A 6.7 x 1.3 x 3.3 cm popliteal cyst is noted in the posterior knee. Limitations: none IMPRESSION: 1. No evidence of DVT in the right lower extremity. 2. A 6.7 cm popliteal cyst is noted in the posterior right knee. Electronically Signed   By: Ileana Roup M.D.   On: 02/13/2022 18:44    Procedures Procedures    Medications Ordered in ED Medications - No data to display  ED Course/ Medical Decision Making/ A&P                           Medical Decision Making  Patient presents with a chief complaint of right calf swelling.  Differential includes DVT, superficial thrombophlebitis, soft tissue injury, and others  I reviewed the patient's past medical history including a recent visit for chronic care management and reviewed the patient's list of medical issues including left-sided low back pain with sciatica, lumbar radiculopathy, and others  I ordered and interpreted imaging including a DVT study of the right lower extremity.  No evidence of DVT.  A 6.7 cm popliteal cyst was noted in the posterior right knee.  I agree with the radiologist findings  I discussed work-up of the patient's potential back pain and radicular symptoms but the patient states she is already followed by neurosurgery and declined  any further work-up which is perfectly reasonable.  She states her main concern was to rule out DVT.  This was negative.  She has a scheduled follow-up with orthopedics on Tuesday and will discuss her Baker's cyst with the orthopedic provider at that visit.  Discharge home        Final Clinical Impression(s) / ED Diagnoses Final diagnoses:  Right calf pain    Rx / DC Orders ED Discharge Orders     None         Ronny Bacon 02/13/22 [place], DO 02/20/22 [phone]

## 2022-02-13 NOTE — ED Notes (Signed)
Patient ambulated to [place] at this time.

## 2022-02-13 NOTE — Discharge Instructions (Addendum)
You were seen today for right calf pain and swelling.  Your DVT study was negative.  I recommend following up with your orthopedic provider at your scheduled visit on Tuesday.  A 6.7 cm popliteal cyst was noted in the posterior right knee.

## 2022-02-15 ENCOUNTER — Encounter: Payer: Self-pay | Admitting: General Practice

## 2022-02-15 DIAGNOSIS — S83241A Other tear of medial meniscus, current injury, right knee, initial encounter: Secondary | ICD-10-CM | POA: Diagnosis not present

## 2022-02-15 DIAGNOSIS — M25572 Pain in left ankle and joints of left foot: Secondary | ICD-10-CM | POA: Diagnosis not present

## 2022-02-16 ENCOUNTER — Other Ambulatory Visit (HOSPITAL_BASED_OUTPATIENT_CLINIC_OR_DEPARTMENT_OTHER): Payer: Self-pay | Admitting: Orthopedic Surgery

## 2022-02-16 DIAGNOSIS — S83241A Other tear of medial meniscus, current injury, right knee, initial encounter: Secondary | ICD-10-CM

## 2022-02-17 ENCOUNTER — Ambulatory Visit: Payer: Medicare Other | Admitting: Family Medicine

## 2022-02-17 DIAGNOSIS — M25561 Pain in right knee: Secondary | ICD-10-CM | POA: Diagnosis not present

## 2022-02-19 ENCOUNTER — Ambulatory Visit (HOSPITAL_BASED_OUTPATIENT_CLINIC_OR_DEPARTMENT_OTHER)
Admission: RE | Admit: 2022-02-19 | Discharge: 2022-02-19 | Disposition: A | Discharge status: Home / Self Care | Payer: Medicare Other | Source: Ambulatory Visit | Attending: Orthopedic Surgery | Admitting: Orthopedic Surgery

## 2022-02-19 DIAGNOSIS — S83241A Other tear of medial meniscus, current injury, right knee, initial encounter: Secondary | ICD-10-CM | POA: Diagnosis not present

## 2022-02-19 DIAGNOSIS — M25561 Pain in right knee: Secondary | ICD-10-CM | POA: Diagnosis not present

## 2022-02-22 ENCOUNTER — Telehealth: Payer: Self-pay

## 2022-02-22 ENCOUNTER — Telehealth: Payer: Self-pay | Admitting: Pharmacist

## 2022-02-22 NOTE — Telephone Encounter (Signed)
        Patient  visited [facility]  on 7/31  for right leg pain   Telephone encounter attempt :  1st outreach  A HIPAA compliant voice message was left requesting a return call.  Instructed patient to call back at earliest convenience.   [facility], Care Management  [phone] [address], [place], [place] [address] Phone: [phone] Email: [email or web address]

## 2022-02-22 NOTE — Telephone Encounter (Signed)
Attempted to contact patient to review medication list and check tolerance and adherence to Pulmicort Inhaler. Also noted that last few office blood pressure readings have been at goal of < 140/90.  Reviewed medication refill history. No current concerns.  Unable to reach patient.  LM on VM with my contact number [phone] and main office number [phone].

## 2022-02-27 DIAGNOSIS — R070 Pain in throat: Secondary | ICD-10-CM | POA: Diagnosis not present

## 2022-02-27 DIAGNOSIS — R519 Headache, unspecified: Secondary | ICD-10-CM | POA: Diagnosis not present

## 2022-02-27 DIAGNOSIS — Z20822 Contact with and (suspected) exposure to covid-19: Secondary | ICD-10-CM | POA: Diagnosis not present

## 2022-02-28 ENCOUNTER — Ambulatory Visit (INDEPENDENT_AMBULATORY_CARE_PROVIDER_SITE_OTHER): Payer: Medicare Other | Admitting: Internal Medicine

## 2022-02-28 ENCOUNTER — Encounter: Payer: Self-pay | Admitting: Internal Medicine

## 2022-02-28 VITALS — BP 120/74 | HR 71 | Ht 62.0 in | Wt 205.0 lb

## 2022-02-28 DIAGNOSIS — E890 Postprocedural hypothyroidism: Secondary | ICD-10-CM | POA: Diagnosis not present

## 2022-02-28 DIAGNOSIS — Z85850 Personal history of malignant neoplasm of thyroid: Secondary | ICD-10-CM

## 2022-02-28 LAB — TSH: TSH: 0.52 u[IU]/mL (ref 0.35–5.50)

## 2022-02-28 MED ORDER — SYNTHROID 137 MCG PO TABS: 137.0000 ug | ORAL_TABLET | Freq: Every day | ORAL | 3 refills | Status: DC

## 2022-02-28 NOTE — Progress Notes (Signed)
Name: Monica Wallace  MRN/ DOB: [id number], 11/25/1950    Age/ Sex: 71 y.o., female     PCP: [name], MD   Reason for Endocrinology Evaluation: Postoperative hypothyroidism     Initial Endocrinology Clinic Visit: 12/08/2020    PATIENT IDENTIFIER: Monica Wallace is a 71 y.o., female with a past medical history of T2DM, Dyslipidemia and Hypothyroidism. She has followed with [facility] since 12/08/2020 for consultative assistance with management of her postoperative hypothyroidism .    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    An ultrasound in 2002 showed MNG with a very large right thyoid nodule 3.6x2.3x1.7 cm    She is S/P total thyroidectomy in 2002 , for PTC ,encapsulated tumor, low grade     12/18/2020 S/P RAI remnant ablation 100 mCi of I-131.      12/28/2000: Posttreatment whole-body scan: Residual functioning thyroid tissue in the thyroid bed in the midline and extending towards the left. No distant metastases identified. 08/02/2006: Thyrogen stimulated I-123 whole body scan: Normal physiologic uptake in nares, salivary glands, gastrointestinal tract and urinary tract. No abnormal uptake indicating any functioning thyroid tissue.        Mother with thyroid disease , S/P total thyroidectomy due to benign nodule.        SUBJECTIVE:    Today (02/28/2022):  Monica Wallace is here for a follow up on hypothyroidism   Presented to the ED 02/13/2022 for Leg pain , DVT ruled out . Has a meniscal tear and baker's cyst Her levothyroxine dose has been changed from 150 mcg to 137 mcg through her PCP. 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 She has never used smokeless tobacco. She reports current alcohol use. She reports that she does not use drugs. Family History:  Family History  Problem Relation Age of Onset   Arthritis Mother    Transient ischemic attack Mother    Hypertension Mother    Hyperlipidemia Mother    Diabetes Father    Heart disease Father    Arthritis Father    Kidney disease Father    Hypertension Father    Hyperlipidemia Father  Cancer Sister 73       pancreatic   Fibromyalgia Sister    Allergic rhinitis Sister    Asthma Maternal Grandfather        smoker   Depression Maternal Grandfather    Dementia Paternal Grandmother    Diabetes Paternal Grandfather    Hypertension Brother    Allergic rhinitis Brother    Scoliosis Daughter        had rods placed and removed   Arthritis Sister    Gout Sister    GER disease Sister    Cancer Sister        breast cancer   Breast cancer Sister 54     HOME MEDICATIONS: Allergies as of 02/28/2022       Reactions   Levaquin [levofloxacin] Shortness Of Breath   Penicillins Other (See Comments)   Positive on allergy testing 'years ago.' Has never had penicillin. Has tolerated amoxicillin.    Clindamycin/lincomycin Other (See Comments)   Pruritus No rash   Codeine Itching   Gabapentin Itching        Medication List        Accurate as of February 28, 2022 11:07 AM. If you have any questions, ask your nurse or doctor.          acetaminophen 325 MG tablet Commonly known as: TYLENOL Take 650 mg by mouth every 6 (six) hours as needed.   albuterol 108 (90 Base) MCG/ACT inhaler Commonly known as: VENTOLIN HFA INHALE 2 PUFFS INTO THE LUNGS EVERY 6 HOURS AS NEEDED FOR WHEEZING OR SHORTNESS OF BREATH   albuterol (2.5 MG/3ML) 0.083% nebulizer solution Commonly known as: PROVENTIL Take 3 mLs (2.5 mg total) by nebulization every 6 (six) hours as needed for wheezing or shortness of breath.   azelastine 0.1 % nasal spray Commonly known as: ASTELIN 1-2 sprays per nostril twice daily for runny nose   CVS VIT D 5000 HIGH-POTENCY PO Take 5,000 Units by mouth daily.   estradiol 0.1 MG/GM vaginal cream Commonly known as: ESTRACE VAGINAL Place 1 Applicatorful vaginally at bedtime.   fluticasone 50 MCG/ACT nasal spray Commonly known as: FLONASE Place 2 sprays into both nostrils daily.   furosemide 20 MG tablet Commonly known as: LASIX 1 tab by mouth daily as needed for edema, weight gain >3lbs in 24 hours   hyoscyamine 0.125 MG SL tablet Commonly known as: LEVSIN SL Place 1 tablet (0.125 mg total) under the tongue every 4 (four) hours as needed.   levocetirizine 5 MG tablet Commonly known as: XYZAL Take 1 tablet (5 mg total) by mouth every evening.   levothyroxine 137 MCG tablet Commonly known as: Synthroid Take 1 tablet (137 mcg total) by mouth daily before breakfast. NAME BRAND ONLY   montelukast 10 MG tablet Commonly known as: SINGULAIR TAKE 1 TABLET(10 MG) BY MOUTH DAILY AS NEEDED What changed:  how much to take how to take this when to take this   Olopatadine HCl 0.2 % Soln Place 1 drop in each eye once a day as needed for itchy watery eyes   Omega-3  1000 MG Caps Take 1,000 mg by mouth daily.   omeprazole 40 MG capsule Commonly known as: PRILOSEC Take 1 capsule by mouth daily.   OneTouch Delica Plus [id number] Misc TEST BLOOD SUGAR ONCE A DAY   OneTouch Verio Flex System w/Device Kit Use to check blood glucose once a day.  DX code: E11.9   OneTouch Verio test strip Generic drug: glucose blood TEST BLOOD GLUCOSE  ONCE A DAY   potassium chloride SA 20 MEQ tablet Commonly known as: KLOR-CON M TAKE 2 TABLETS BY MOUTH DAILY FOR 3 DAYS. THEN TAKE 1 TABLET BY MOUTH DAILY.   Pulmicort Flexhaler 180 MCG/ACT inhaler Generic drug: budesonide 2 puffs twice daily to prevent coughing or wheezing.   rosuvastatin 5 MG tablet Commonly known as: CRESTOR TAKE 1 TAKE ONE TABLET EVERY__ HOURS TAKE ONE TABLET EVERY__ HOURS TAB BY MOUTH 2 TIMES A WEEK(TUE AND SUN WEEKLY)   triamterene-hydrochlorothiazide 37.5-25 MG tablet Commonly known as: MAXZIDE-25 TAKE 1 TABLET BY MOUTH DAILY          OBJECTIVE:   PHYSICAL EXAM: VS: BP 120/74 (BP Location: Left Arm, Patient Position: Sitting, Cuff Size: Large)   Pulse 71   Ht _0  (1.575 m)   Wt 205 lb (93 kg)   SpO2 96%   BMI 37.49 kg/m    EXAM: General: Pt appears well and is in NAD  Neck: General: Supple without adenopathy. Thyroid: Thyroid size normal.  No goiter or nodules appreciated. No thyroid bruit.  Lungs: Clear with good BS bilat with no rales, rhonchi, or wheezes  Heart: Auscultation: RRR.  Abdomen: Normoactive bowel sounds, soft, nontender, without masses or organomegaly palpable  Extremities:  BL LE: No pretibial edema normal ROM and strength.  Mental Status: Judgment, insight: Intact Orientation: Oriented to time, place, and person Memory: Intact for recent and remote events Mood and affect: No depression, anxiety, or agitation     DATA REVIEWED:  Latest Reference Range & Units 02/28/22 11:22  TSH 0.35 - 5.50 uIU/mL 0.52       Latest Reference Range & Units  06/08/21 16:29  TSH 0.35 - 5.50 uIU/mL 0.69  Thyroglobulin ng/mL <0.1 (L)  Thyroglobulin Ab < or = 1 IU/mL <1   Thyroid bed ultrasound 06/26/2021 No residual thyroid tissue is identified in the thyroidectomy bed.   Nonenlarged bilateral neck lymph nodes are again seen.   IMPRESSION: No evidence of recurrent or residual thyroid malignancy.  ASSESSMENT / PLAN / RECOMMENDATIONS:   Postsurgical Hypothyroidism :  -Patient is clinically and biochemically euthyroid - TSH at goal, no changes    -Medications : Continue Synthroid 137 MCG daily  2. Hx pf PTC:   - TSH goal 0.5-2.0 uIU/mL  - She has excellent response to treatment - She is at low risk of recurrence - Tg and Tg antibody have been undetectable in the past, pending today -Thyroid bed ultrasound in December 2022 did not show any evidence of recurrence nor residual thyroid malignancy      F/U in 6 months   Signed electronically by: Mack Guise, MD  [facility] Endocr[facility]  [facility] [address] [place], [place] [address] Phone: [phone] FAX: [phone]      CC: [name], [address] [place] [address] [place] [address] Phone: [phone]  Fax: [phone]   Return to Endocrinology clinic as below: Future Appointments  Date Time Provider [place]  02/28/2022 11:10 AM Anoop Hemmer, Melanie Crazier, MD [facility] None  03/10/2022  9:00 AM [facility] PFT ROOM [facility] None  03/10/2022 10:00 AM Laurin Coder, MD [facility] None  04/04/2022  3:15 PM [facility] HE[facility] COACH 2 [facility]  04/22/2022  3:15 PM Clemon Chambers, MD [facility] None  05/12/2022 11:20 AM [name], MD [facility]

## 2022-02-28 NOTE — Patient Instructions (Signed)

## 2022-03-02 LAB — THYROGLOBULIN LEVEL: Thyroglobulin: 0.1 ng/mL — ABNORMAL LOW

## 2022-03-02 LAB — THYROGLOBULIN ANTIBODY: Thyroglobulin Ab: <1 IU/mL (ref <=1)

## 2022-03-10 ENCOUNTER — Encounter: Payer: Self-pay | Admitting: Pulmonary Disease

## 2022-03-10 ENCOUNTER — Ambulatory Visit (INDEPENDENT_AMBULATORY_CARE_PROVIDER_SITE_OTHER): Payer: Medicare Other | Admitting: Pulmonary Disease

## 2022-03-10 VITALS — BP 122/76 | HR 80 | Temp 97.7°F | Ht 62.0 in | Wt 206.2 lb

## 2022-03-10 DIAGNOSIS — R0602 Shortness of breath: Secondary | ICD-10-CM | POA: Diagnosis not present

## 2022-03-10 DIAGNOSIS — R0609 Other forms of dyspnea: Secondary | ICD-10-CM

## 2022-03-10 NOTE — Patient Instructions (Signed)
Full PFT Performed Today  

## 2022-03-10 NOTE — Progress Notes (Signed)
Full PFT Performed Today  

## 2022-03-10 NOTE — Progress Notes (Signed)
Monica Wallace    [id number]    05-Nov-1950  Primary Care Physician:Blyth, Bonnita Levan, MD  Referring Physician: Alric Ran, [address] [place],  [place] [address]  Chief complaint:   Patient being evaluated for obstructive sleep apnea Shortness of breath  HPI: In for follow-up today with no significant concerns  Had a pulmonary function test today which was within normal limits  No shortness of breath at rest, does get short of breath with activity Rarely uses albuterol  Was diagnosed with obstructive sleep apnea but could not tolerate CPAP  She still does have some daytime fatigue which she feels is multifactorial  She does not feel she will be able to tolerate CPAP Some degree of claustrophobia, did try different masks in the past  She has an occasional cough Some arthritis Sciatica, weakness on the right side -MRIs have not shown a stroke -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, plantar fasciitis  Did have COVID in 2021, was not hospitalized for it She describes episodes of some weakness of her arms in the past-work-up was negative with CTs, MRI  She does have some cough, wheezing -Uses Pulmicort twice a day, albuterol only as needed -We will end up using albuterol about once a week  Rare headaches Rare night sweats Memory is good  She does have hypertension, asthma, diabetes, hypercholesterolemia, sinus and allergy problems, hypothyroidism  Never smoker  Tries to go to sleep between 8 and 9 PM Takes a few minutes to fall asleep 1-2 awakenings Final wake up time about 430  Weight is stable recently we will see her back in a year nothing to   Outpatient Encounter Medications as of 03/10/2022  Medication Sig   acetaminophen (TYLENOL) 325 MG tablet Take 650 mg by mouth every 6 (six) hours as needed.   albuterol (PROVENTIL) (2.5 MG/3ML) 0.083% nebulizer solution Take 3 mLs (2.5 mg total) by nebulization every 6 (six) hours  as needed for wheezing or shortness of breath.   albuterol (VENTOLIN HFA) 108 (90 Base) MCG/ACT inhaler INHALE 2 PUFFS INTO THE LUNGS EVERY 6 HOURS AS NEEDED FOR WHEEZING OR SHORTNESS OF BREATH   azelastine (ASTELIN) 0.1 % nasal spray 1-2 sprays per nostril twice daily for runny nose   Blood Glucose Monitoring Suppl (ONETOUCH VERIO FLEX SYSTEM) w/Device KIT Use to check blood glucose once a day.  DX code: E11.9   budesonide (PULMICORT FLEXHALER) 180 MCG/ACT inhaler 2 puffs twice daily to prevent coughing or wheezing.   Cholecalciferol ([employer] VIT D 5000 HIGH-POTENCY PO) Take 5,000 Units by mouth daily.   estradiol (ESTRACE VAGINAL) 0.1 MG/GM vaginal cream Place 1 Applicatorful vaginally at bedtime.   fluticasone (FLONASE) 50 MCG/ACT nasal spray Place 2 sprays into both nostrils daily.   furosemide (LASIX) 20 MG tablet 1 tab by mouth daily as needed for edema, weight gain >3lbs in 24 hours   glucose blood (ONETOUCH VERIO) test strip TEST BLOOD GLUCOSE ONCE A DAY   hyoscyamine (LEVSIN SL) 0.125 MG SL tablet Place 1 tablet (0.125 mg total) under the tongue every 4 (four) hours as needed.   Lancets (ONETOUCH DELICA PLUS [id number]) MISC TEST BLOOD SUGAR ONCE A DAY   levocetirizine (XYZAL) 5 MG tablet Take 1 tablet (5 mg total) by mouth every evening.   montelukast (SINGULAIR) 10 MG tablet TAKE 1 TABLET(10 MG) BY MOUTH DAILY AS NEEDED (Patient taking differently: Take 10 mg by mouth daily. TAKE  1 TABLET(10 MG) BY MOUTH DAILY AS NEEDED)   Olopatadine HCl 0.2 % SOLN Place 1 drop in each eye once a day as needed for itchy watery eyes   Omega-3 1000 MG CAPS Take 1,000 mg by mouth daily.   omeprazole (PRILOSEC) 40 MG capsule Take 1 capsule by mouth daily.   potassium chloride SA (KLOR-CON M) 20 MEQ tablet TAKE 2 TABLETS BY MOUTH DAILY FOR 3 DAYS. THEN TAKE 1 TABLET BY MOUTH DAILY.   rosuvastatin (CRESTOR) 5 MG tablet TAKE 1 TAKE ONE TABLET EVERY__ HOURS TAKE ONE TABLET EVERY__ HOURS TAB BY MOUTH 2 TIMES A  WEEK(TUE AND SUN WEEKLY)   SYNTHROID 137 MCG tablet Take 1 tablet (137 mcg total) by mouth daily before breakfast.   triamterene-hydrochlorothiazide (MAXZIDE-25) 37.5-25 MG tablet TAKE 1 TABLET BY MOUTH DAILY   No facility-administered encounter medications on file as of 03/10/2022.    Allergies as of 03/10/2022 - Review Complete 03/10/2022  Allergen Reaction Noted   Levaquin [levofloxacin] Shortness Of Breath 08/22/2013   Penicillins Other (See Comments) 08/22/2013   Clindamycin/lincomycin Other (See Comments) 11/20/2017   Codeine Itching 08/22/2013   Gabapentin Itching 09/02/2019    Past Medical History:  Diagnosis Date   Arthritis    Asthma    Baker's cyst of knee, right 02/16/2017   Cancer ([place]) 2002   thyroid (2002)   Cystic breast    Diabetes ([place])    GERD (gastroesophageal reflux disease)    Hepatic cyst 02/02/2015   Hyperlipidemia    Hypertension    Insomnia 07/23/2016   Menopause    Migraine    Palpitations    Preventative health care 01/18/2016   Sleep apnea 02/02/2015   Thyroid disease     Past Surgical History:  Procedure Laterality Date   ABDOMINAL HYSTERECTOMY     took right ovary and uterus   APPENDECTOMY     CARPAL TUNNEL RELEASE Right    CESAREAN SECTION     CHOLECYSTECTOMY     EYE SURGERY Bilateral 2017   cataracts   KNEE ARTHROPLASTY     SHOULDER ARTHROSCOPY W/ ROTATOR CUFF REPAIR Left    THYROIDECTOMY  2002    Family History  Problem Relation Age of Onset   Arthritis Mother    Transient ischemic attack Mother    Hypertension Mother    Hyperlipidemia Mother    Diabetes Father    Heart disease Father    Arthritis Father    Kidney disease Father    Hypertension Father    Hyperlipidemia Father    Cancer Sister 24       pancreatic   Fibromyalgia Sister    Allergic rhinitis Sister    Asthma Maternal Grandfather        smoker   Depression Maternal Grandfather    Dementia Paternal Grandmother    Diabetes Paternal Grandfather     Hypertension Brother    Allergic rhinitis Brother    Scoliosis Daughter        had rods placed and removed   Arthritis Sister    Gout Sister    GER disease Sister    Cancer Sister        breast cancer   Breast cancer Sister 2    Social History   Socioeconomic History   Marital status: Divorced    Spouse name: Not on file   Number of children: 2   Years of education: Not on file   Highest education level: Not on file  Occupational History  Not on file  Tobacco Use   Smoking status: Never   Smokeless tobacco: Never  Vaping Use   Vaping Use: Never used  Substance and Sexual Activity   Alcohol use: Yes    Alcohol/week: 0.0 standard drinks of alcohol    Comment: very rare once or twice a year   Drug use: No   Sexual activity: Yes    Birth control/protection: Other-see comments, Surgical    Comment:  boyfriend  Other Topics Concern   Not on file  Social History Narrative   Household: pt, daughter and sister    Social Determinants of Health   Financial Resource Strain: Medium Risk (11/23/2021)   Overall Financial Resource Strain (CARDIA)    Difficulty of Paying Living Expenses: Somewhat hard  Food Insecurity: No Food Insecurity (06/03/2021)   Hunger Vital Sign    Worried About Running Out of Food in the Last Year: Never true    Ran Out of Food in the Last Year: Never true  Transportation Needs: No Transportation Needs (06/03/2021)   PRAPARE - Hydrologist (Medical): No    Lack of Transportation (Non-Medical): No  Physical Activity: Inactive (02/08/2021)   Exercise Vital Sign    Days of Exercise per Week: 0 days    Minutes of Exercise per Session: 0 min  Stress: No Stress Concern Present (02/08/2021)   [place]    Feeling of Stress : Not at all  Social Connections: Moderately Isolated (02/08/2021)   Social Connection and Isolation Panel [NHANES]    Frequency of  Communication with Friends and Family: More than three times a week    Frequency of Social Gatherings with Friends and Family: More than three times a week    Attends Religious Services: More than 4 times per year    Active Member of [employer] or Organizations: No    Attends Archivist Meetings: Never    Marital Status: Divorced  Human resources officer Violence: Not At Risk (02/08/2021)   Humiliation, Afraid, Rape, and Kick questionnaire    Fear of Current or Ex-Partner: No    Emotionally Abused: No    Physically Abused: No    Sexually Abused: No    Review of Systems  Constitutional:  Positive for fatigue.  Respiratory:  Positive for cough.     Vitals:   03/10/22 1015  BP: 122/76  Pulse: 80  Temp: 97.7 F (36.5 C)  SpO2: 98%     Physical Exam Constitutional:      Appearance: She is obese.  HENT:     Head: Normocephalic.     Mouth/Throat:     Mouth: Mucous membranes are moist.  Cardiovascular:     Rate and Rhythm: Normal rate and regular rhythm.     Heart sounds: No murmur heard.    No friction rub.  Pulmonary:     Effort: No respiratory distress.     Breath sounds: No stridor. No wheezing, rhonchi or rales.  Musculoskeletal:     Cervical back: No rigidity or tenderness.  Neurological:     Mental Status: She is alert.  Psychiatric:        Mood and Affect: Mood normal.        08/16/2021    3:00 PM 06/01/2016    1:00 PM  Results of the [facility] flowsheet  Sitting and reading 0 1  Watching TV 0 1  Sitting, inactive in a public place (e.g. a theatre  or a meeting) 0 0  As a passenger in a car for an hour without a break 2 1  Lying down to rest in the afternoon when circumstances permit 2 3  Sitting and talking to someone 0 0  Sitting quietly after a lunch without alcohol 0 1  In a car, while stopped for a few minutes in traffic 0 0  Total score 4 7    Data Reviewed:  No downloads from previous compliance available  Previous study reviewed showing AHI of  5.2  Home sleep study 10/22/2021 shows moderate obstructive sleep apnea with an AHI of 15.1  PFT reviewed today showing no obstruction, no significant bronchodilator response, no restriction, normal diffusing capacity  Assessment:  Past history of mild obstructive sleep apnea-now diagnosed with moderate obstructive sleep apnea -Did not tolerate CPAP well -Not able to tolerate BiPAP as well -Clinical monitoring of symptoms  Still with daytime sleepiness and fatigue  Repeat PFT is within normal limits today  Moderate obstructive sleep apnea -Options of treatment discussed including an inspire device -She will continue to work on weight loss efforts  Plan/Recommendations: Continue bronchodilators as needed  Graded exercise as tolerated  Tentative follow-up in a year from now  Encouraged to call with any significant concerns    Sherrilyn Rist MD [facility] 03/10/2022, 10:23 AM  CC: Alric Ran, MD

## 2022-03-10 NOTE — Patient Instructions (Signed)
Your breathing study is within normal limits  Graded exercises as tolerated Call us with any significant concerns  I will see you back in about a year

## 2022-03-11 LAB — PULMONARY FUNCTION TEST
DL/VA % pred: 120 %
DL/VA: 5.05 ml/min/mmHg/L
DLCO cor % pred: 109 %
DLCO cor: 19.73 ml/min/mmHg
DLCO unc % pred: 108 %
DLCO unc: 19.54 ml/min/mmHg
FEF 25-75 Post: 3.04 L/sec
FEF 25-75 Pre: 3.06 L/sec
FEF2575-%Change-Post: 0 %
FEF2575-%Pred-Post: 184 %
FEF2575-%Pred-Pre: 186 %
FEV1-%Change-Post: 1 %
FEV1-%Pred-Post: 93 %
FEV1-%Pred-Pre: 92 %
FEV1-Post: 1.87 L
FEV1-Pre: 1.84 L
FEV1FVC-%Change-Post: 2 %
FEV1FVC-%Pred-Pre: 117 %
FEV6-%Change-Post: 0 %
FEV6-%Pred-Post: 81 %
FEV6-%Pred-Pre: 82 %
FEV6-Post: 2.06 L
FEV6-Pre: 2.08 L
FEV6FVC-%Pred-Post: 105 %
FEV6FVC-%Pred-Pre: 105 %
FVC-%Change-Post: 0 %
FVC-%Pred-Post: 77 %
FVC-%Pred-Pre: 78 %
FVC-Post: 2.06 L
Post FEV1/FVC ratio: 91 %
Post FEV6/FVC ratio: 100 %
Pre FEV1/FVC ratio: 89 %
Pre FEV6/FVC Ratio: 100 %
RV % pred: 92 %
RV: 1.99 L
TLC % pred: 86 %
TLC: 4.14 L

## 2022-03-15 ENCOUNTER — Ambulatory Visit: Payer: Medicare Other | Admitting: Internal Medicine

## 2022-03-17 ENCOUNTER — Encounter: Payer: Self-pay | Admitting: Family Medicine

## 2022-03-17 MED ORDER — MONTELUKAST SODIUM 10 MG PO TABS: 10.0000 mg | ORAL_TABLET | Freq: Every day | ORAL | 1 refills | Status: DC

## 2022-04-04 ENCOUNTER — Ambulatory Visit (INDEPENDENT_AMBULATORY_CARE_PROVIDER_SITE_OTHER): Payer: Medicare Other

## 2022-04-04 VITALS — Ht 62.0 in | Wt 204.4 lb

## 2022-04-04 DIAGNOSIS — Z0000 Encounter for general adult medical examination without abnormal findings: Secondary | ICD-10-CM | POA: Diagnosis not present

## 2022-04-04 DIAGNOSIS — Z780 Asymptomatic menopausal state: Secondary | ICD-10-CM | POA: Diagnosis not present

## 2022-04-04 NOTE — Progress Notes (Signed)
Subjective:   Monica Wallace is a 71 y.o. female who presents for Medicare Annual (Subsequent) preventive examination.  I connected with Emilynn today by telephone and verified that I am speaking with the correct person using two identifiers. Location patient: home Location provider: work Persons participating in the virtual visit: patient, Marine scientist.    I discussed the limitations, risks, security and privacy concerns of performing an evaluation and management service by telephone and the availability of in person appointments. I also discussed with the patient that there may be a patient responsible charge related to this service. The patient expressed understanding and verbally consented to this telephonic visit.    Interactive audio and video telecommunications were attempted between this provider and patient, however failed, due to patient having technical difficulties OR patient did not have access to video capability.  We continued and completed visit with audio only.  Some vital signs may be absent or patient reported.   Time Spent with patient on telephone encounter: 30 minutes   Review of Systems     Cardiac Risk Factors include: advanced age (>63mn, >>70women);hypertension;dyslipidemia;diabetes mellitus;obesity (BMI >30kg/m2)     Objective:    Today's Vitals   04/04/22 1515 04/04/22 1516  Weight: 204 lb 6.4 oz (92.7 kg)   Height: _0  (1.575 m)   PainSc:  2    Body mass index is 37.39 kg/m.     04/04/2022    3:18 PM 02/13/2022    4:27 PM 11/24/2021    5:49 PM 11/04/2021    6:44 PM 08/31/2021    3:31 PM 07/16/2021    1:20 PM 05/19/2021    4:59 PM  Advanced Directives  Does Patient Have a Medical Advance Directive? _1  No No  Would patient like information on creating a medical advance directive?   No - Patient declined No - Patient declined Yes (MAU/Ambulatory/Procedural Areas - Information given) No - Patient declined No - Patient declined    Current  Medications (verified) Outpatient Encounter Medications as of 04/04/2022  Medication Sig   acetaminophen (TYLENOL) 325 MG tablet Take 650 mg by mouth every 6 (six) hours as needed.   albuterol (PROVENTIL) (2.5 MG/3ML) 0.083% nebulizer solution Take 3 mLs (2.5 mg total) by nebulization every 6 (six) hours as needed for wheezing or shortness of breath.   albuterol (VENTOLIN HFA) 108 (90 Base) MCG/ACT inhaler INHALE 2 PUFFS INTO THE LUNGS EVERY 6 HOURS AS NEEDED FOR WHEEZING OR SHORTNESS OF BREATH   azelastine (ASTELIN) 0.1 % nasal spray 1-2 sprays per nostril twice daily for runny nose   Blood Glucose Monitoring Suppl (ONETOUCH VERIO FLEX SYSTEM) w/Device KIT Use to check blood glucose once a day.  DX code: E11.9   budesonide (PULMICORT FLEXHALER) 180 MCG/ACT inhaler 2 puffs twice daily to prevent coughing or wheezing.   Cholecalciferol ([employer] VIT D 5000 HIGH-POTENCY PO) Take 5,000 Units by mouth daily.   estradiol (ESTRACE VAGINAL) 0.1 MG/GM vaginal cream Place 1 Applicatorful vaginally at bedtime.   fluticasone (FLONASE) 50 MCG/ACT nasal spray Place 2 sprays into both nostrils daily.   furosemide (LASIX) 20 MG tablet 1 tab by mouth daily as needed for edema, weight gain >3lbs in 24 hours   glucose blood (ONETOUCH VERIO) test strip TEST BLOOD GLUCOSE ONCE A DAY   hyoscyamine (LEVSIN SL) 0.125 MG SL tablet Place 1 tablet (0.125 mg total) under the tongue every 4 (four) hours as needed.   Lancets (ONETOUCH DELICA PLUS [id number] MEthete  TEST BLOOD SUGAR ONCE A DAY   levocetirizine (XYZAL) 5 MG tablet Take 1 tablet (5 mg total) by mouth every evening.   montelukast (SINGULAIR) 10 MG tablet Take 1 tablet (10 mg total) by mouth at bedtime.   Olopatadine HCl 0.2 % SOLN Place 1 drop in each eye once a day as needed for itchy watery eyes   Omega-3 1000 MG CAPS Take 1,000 mg by mouth daily.   omeprazole (PRILOSEC) 40 MG capsule Take 1 capsule by mouth daily.   potassium chloride SA (KLOR-CON M) 20 MEQ tablet  TAKE 2 TABLETS BY MOUTH DAILY FOR 3 DAYS. THEN TAKE 1 TABLET BY MOUTH DAILY.   rosuvastatin (CRESTOR) 5 MG tablet TAKE 1 TAKE ONE TABLET EVERY__ HOURS TAKE ONE TABLET EVERY__ HOURS TAB BY MOUTH 2 TIMES A WEEK(TUE AND SUN WEEKLY)   SYNTHROID 137 MCG tablet Take 1 tablet (137 mcg total) by mouth daily before breakfast.   triamterene-hydrochlorothiazide (MAXZIDE-25) 37.5-25 MG tablet TAKE 1 TABLET BY MOUTH DAILY   No facility-administered encounter medications on file as of 04/04/2022.    Allergies (verified) Levaquin [levofloxacin], Penicillins, Clindamycin/lincomycin, Codeine, and Gabapentin   History: Past Medical History:  Diagnosis Date   Arthritis    Asthma    Baker's cyst of knee, right 02/16/2017   Cancer ([place]) 2002   thyroid (2002)   Cystic breast    Diabetes ([place])    GERD (gastroesophageal reflux disease)    Hepatic cyst 02/02/2015   Hyperlipidemia    Hypertension    Insomnia 07/23/2016   Menopause    Migraine    Palpitations    Preventative health care 01/18/2016   Sleep apnea 02/02/2015   Thyroid disease    Past Surgical History:  Procedure Laterality Date   ABDOMINAL HYSTERECTOMY     took right ovary and uterus   APPENDECTOMY     CARPAL TUNNEL RELEASE Right    CESAREAN SECTION     CHOLECYSTECTOMY     EYE SURGERY Bilateral 2017   cataracts   KNEE ARTHROPLASTY     SHOULDER ARTHROSCOPY W/ ROTATOR CUFF REPAIR Left    THYROIDECTOMY  2002   Family History  Problem Relation Age of Onset   Arthritis Mother    Transient ischemic attack Mother    Hypertension Mother    Hyperlipidemia Mother    Diabetes Father    Heart disease Father    Arthritis Father    Kidney disease Father    Hypertension Father    Hyperlipidemia Father    Cancer Sister 93       pancreatic   Fibromyalgia Sister    Allergic rhinitis Sister    Asthma Maternal Grandfather        smoker   Depression Maternal Grandfather    Dementia Paternal Grandmother    Diabetes Paternal Grandfather     Hypertension Brother    Allergic rhinitis Brother    Scoliosis Daughter        had rods placed and removed   Arthritis Sister    Gout Sister    GER disease Sister    Cancer Sister        breast cancer   Breast cancer Sister 30   Social History   Socioeconomic History   Marital status: Divorced    Spouse name: Not on file   Number of children: 2   Years of education: Not on file   Highest education level: Not on file  Occupational History   Not on file  Tobacco Use   Smoking status: Never   Smokeless tobacco: Never  Vaping Use   Vaping Use: Never used  Substance and Sexual Activity   Alcohol use: Yes    Alcohol/week: 0.0 standard drinks of alcohol    Comment: very rare once or twice a year   Drug use: No   Sexual activity: Yes    Birth control/protection: Other-see comments, Surgical    Comment:  boyfriend  Other Topics Concern   Not on file  Social History Narrative   Household: pt, daughter and sister    Social Determinants of Health   Financial Resource Strain: Medium Risk (11/23/2021)   Overall Financial Resource Strain (CARDIA)    Difficulty of Paying Living Expenses: Somewhat hard  Food Insecurity: No Food Insecurity (04/04/2022)   Hunger Vital Sign    Worried About Running Out of Food in the Last Year: Never true    Ran Out of Food in the Last Year: Never true  Transportation Needs: No Transportation Needs (04/04/2022)   PRAPARE - Hydrologist (Medical): No    Lack of Transportation (Non-Medical): No  Physical Activity: Inactive (02/08/2021)   Exercise Vital Sign    Days of Exercise per Week: 0 days    Minutes of Exercise per Session: 0 min  Stress: No Stress Concern Present (04/04/2022)   [place]    Feeling of Stress : Not at all  Social Connections: Moderately Isolated (02/08/2021)   Social Connection and Isolation Panel [NHANES]    Frequency of  Communication with Friends and Family: More than three times a week    Frequency of Social Gatherings with Friends and Family: More than three times a week    Attends Religious Services: More than 4 times per year    Active Member of [employer] or Organizations: No    Attends Music therapist: Never    Marital Status: Divorced    Tobacco Counseling Counseling given: Not Answered   Clinical Intake:  Pre-visit preparation completed: Yes  Pain : 0-10 Pain Score: 2  Pain Type: Chronic pain Pain Location: [place]     BMI - recorded: 37.39 Nutritional Status: BMI > 30  Obese Nutritional Risks: None Diabetes: Yes CBG done?: No Did pt. bring in CBG monitor from home?: No (phone visit)  How often do you need to have someone help you when you read instructions, pamphlets, or other written materials from your doctor or pharmacy?: 1 - Never  Diabetes:  Is the patient diabetic?  Yes  If diabetic, was a CBG obtained today?  No  Did the patient bring in their glucometer from home?  No phone visit How often do you monitor your CBG's? 2 times per week.   Financial Strains and Diabetes Management:  Are you having any financial strains with the device, your supplies or your medication? No .  Does the patient want to be seen by Chronic Care Management for management of their diabetes?  No  Would the patient like to be referred to a Nutritionist or for Diabetic Management?  No   Diabetic Exams:  Diabetic Eye Exam: Completed 12/16/2021.   Diabetic Foot Exam:  Pt has been advised about the importance in completing this exam. PCP to completed  Interpreter Needed?: No  Information entered by :: Caroleen Hamman LPN   Activities of Daily Living    04/04/2022    3:25 PM 03/29/2022   11:06 AM  In your present state of health, do you have any difficulty performing the following activities:  Hearing? 0 0  Vision? 0 0  Difficulty concentrating or making decisions? 1 0  Walking or  climbing stairs? 0 0  Dressing or bathing? 0 0  Doing errands, shopping? 0 0  Preparing Food and eating ? N N  Using the Toilet? N N  In the past six months, have you accidently leaked urine? Y Y  Do you have problems with loss of bowel control? N N  Managing your Medications? N N  Managing your Finances? N N  Housekeeping or managing your Housekeeping? N N    Patient Care Team: [name], MD as PCP - General (Family Medicine) Juanita Craver, MD as Consulting Physician (Gastroenterology) [place], Mike Gip, MD (Inactive) as Consulting Physician (Pulmonary Disease) Darleen Crocker, MD as Consulting Physician (Ophthalmology) Leta Baptist, MD as Consulting Physician (Otolaryngology) Cherre Robins, RPH-CPP (Pharmacist) [facility], Melanie Crazier, MD as Attending Physician (Endocrinology)  Indicate any recent Medical Services you may have received from other than [facility] providers in the past year (date may be approximate).     Assessment:   This is a routine wellness examination for Monica Wallace.  Hearing/Vision screen Hearing Screening - Comments:: No issues Vision Screening - Comments:: Last eye exam-12/2021-Dr. Daughtery  Dietary issues and exercise activities discussed: Current Exercise Habits: The patient does not participate in regular exercise at present, Exercise limited by: None identified   Goals Addressed             This Visit's Progress    Increase physical activity   On track    Increase water intake   On track      Depression Screen    04/04/2022    3:24 PM 02/08/2022    9:18 AM 11/09/2021    9:51 AM 07/20/2021    4:25 PM 03/02/2021    1:37 PM 02/08/2021    3:58 PM 10/13/2020    2:51 PM  PHQ 2/9 Scores  PHQ - 2 Score 0 0 0 0 0 0 0    Fall Risk    04/04/2022    3:21 PM 03/29/2022   11:06 AM 02/08/2022    9:17 AM 11/09/2021    9:50 AM 07/20/2021    4:22 PM  Fall Risk   Falls in the past year? _0 Number falls in past yr: 0 0 0 0 0  Injury with Fall? 0  1 0 1 0  Risk for fall due to :   Impaired balance/gait History of fall(s)   Follow up Falls prevention discussed  Falls evaluation completed Falls evaluation completed Falls evaluation completed    FALL RISK PREVENTION PERTAINING TO THE HOME:  Any stairs in or around the home? Yes  If so, are there any without handrails? No  Home free of loose throw rugs in walkways, pet beds, electrical cords, etc? Yes  Adequate lighting in your home to reduce risk of falls? Yes   ASSISTIVE DEVICES UTILIZED TO PREVENT FALLS:  Life alert? No  Use of a cane, walker or w/c? No  Grab bars in the bathroom? No  Shower chair or bench in shower? No  Elevated toilet seat or a handicapped toilet? No   TIMED UP AND GO:  Was the test performed? No . Phone visit   Cognitive Function:Normal cognitive status assessed by this Nurse Health Advisor. No abnormalities found.      07/21/2016  4:22 PM  MMSE - Mini Mental State Exam  Orientation to time 5  Orientation to Place 5  Registration 3  Attention/ Calculation 5  Recall 3  Language- name 2 objects 2  Language- repeat 1  Language- follow 3 step command 3  Language- read & follow direction 1  Write a sentence 1  Copy design 1  Total score 30        04/04/2022    3:32 PM  6CIT Screen  What Year? 0 points  What month? 0 points  What time? 0 points  Count back from 20 0 points  Months in reverse 0 points  Repeat phrase 0 points  Total Score 0 points    Immunizations Immunization History  Administered Date(s) Administered   Influenza, High Dose Seasonal PF 05/03/2017, 06/05/2018, 06/06/2020   Influenza-Unspecified 03/19/2015, 05/09/2016, 04/17/2021   PFIZER(Purple Top)SARS-COV-2 Vaccination 11/02/2019, 11/26/2019, 06/20/2020   Pneumococcal Conjugate-13 01/18/2016   Pneumococcal Polysaccharide-23 01/14/2021   Tdap 07/01/2014    TDAP status: Up to date  Flu Vaccine status: Due, Education has been provided regarding the importance of  this vaccine. Advised may receive this vaccine at local pharmacy or Health Dept. Aware to provide a copy of the vaccination record if obtained from local pharmacy or Health Dept. Verbalized acceptance and understanding.  Pneumococcal vaccine status: Up to date  Covid-19 vaccine status: Information provided on how to obtain vaccines.   Qualifies for Shingles Vaccine? Yes   Zostavax completed No   Shingrix Completed?: No.    Education has been provided regarding the importance of this vaccine. Patient has been advised to call insurance company to determine out of pocket expense if they have not yet received this vaccine. Advised may also receive vaccine at local pharmacy or Health Dept. Verbalized acceptance and understanding.  Screening Tests Health Maintenance  Topic Date Due   FOOT EXAM  Never done   Zoster Vaccines- Shingrix (1 of 2) Never done   INFLUENZA VACCINE  02/15/2022   HEMOGLOBIN A1C  08/11/2022   OPHTHALMOLOGY EXAM  12/17/2022   Diabetic kidney evaluation - GFR measurement  02/09/2023   Diabetic kidney evaluation - Urine ACR  02/09/2023   MAMMOGRAM  05/30/2023   TETANUS/TDAP  07/01/2024   COLONOSCOPY (Pts 45-60yr Insurance coverage will need to be confirmed)  06/07/2025   Pneumonia Vaccine 71 Years old  Completed   DEXA SCAN  Completed   Hepatitis C Screening  Completed   HPV VACCINES  Aged Out   COVID-19 Vaccine  Discontinued    Health Maintenance  Health Maintenance Due  Topic Date Due   FOOT EXAM  Never done   Zoster Vaccines- Shingrix (1 of 2) Never done   INFLUENZA VACCINE  02/15/2022    Colorectal cancer screening: Type of screening: Colonoscopy. Completed 06/08/2015. Repeat every 10 years  Mammogram status: Completed Bilateral 05/29/2021. Repeat every year  Bone Density status: Ordered today. Pt provided with contact info and advised to call to schedule appt.  Lung Cancer Screening: (Low Dose CT Chest recommended if Age 73-80years, 30 pack-year  currently smoking OR have quit w/in 15years.) does not qualify.     Additional Screening:  Hepatitis C Screening: Completed 01/18/2016  Vision Screening: Recommended annual ophthalmology exams for early detection of glaucoma and other disorders of the eye. Is the patient up to date with their annual eye exam?  Yes  Who is the provider or what is the name of the office in which the patient attends annual eye  exams? Dr. Aundra Dubin   Dental Screening: Recommended annual dental exams for proper oral hygiene  Community Resource Referral / Chronic Care Management: CRR required this visit?  No  CCM required this visit?  No      Plan:     I have personally reviewed and noted the following in the patient's chart:   Medical and social history Use of alcohol, tobacco or illicit drugs  Current medications and supplements including opioid prescriptions. Patient is not currently taking opioid prescriptions. Functional ability and status Nutritional status Physical activity Advanced directives List of other physicians Hospitalizations, surgeries, and ER visits in previous 12 months Vitals Screenings to include cognitive, depression, and falls Referrals and appointments  In addition, I have reviewed and discussed with patient certain preventive protocols, quality metrics, and best practice recommendations. A written personalized care plan for preventive services as well as general preventive health recommendations were provided to patient.   Due to this being a telephonic visit, the after visit summary with patients personalized plan was offered to patient via mail or my-chart. Patient would like to access on my-chart.   Marta Antu, LPN   11/11/6699  Nurse Health Advisor  Nurse Notes: None

## 2022-04-04 NOTE — Patient Instructions (Signed)
[name] , Thank you for taking time to complete your Medicare Wellness Visit. I appreciate your ongoing commitment to your health goals. Please review the following plan we discussed and let me know if I can assist you in the future.   Screening recommendations/referrals: Colonoscopy: Completed 06/08/2015-Due 06/07/2025 Mammogram: Completed 05/29/2021-Due 05/29/2022 Bone Density: Ordered today. Someone will call you to schedule. Recommended yearly ophthalmology/optometry visit for glaucoma screening and checkup Recommended yearly dental visit for hygiene and checkup  Vaccinations: Influenza vaccine: Due-May obtain vaccine at our office or your local pharmacy. Pneumococcal vaccine: Up to date Tdap vaccine: Up to date Shingles vaccine: Due-May obtain vaccine at  your local pharmacy.   Covid-19:Booster available at your local pharmacy  Advanced directives: May pick up information at your next visit  Conditions/risks identified: See  Next appointment: Follow up in one year for your annual wellness visit    Preventive Care 65 Years and Older, Female Preventive care refers to lifestyle choices and visits with your health care provider that can promote health and wellness. What does preventive care include? A yearly physical exam. This is also called an annual well check. Dental exams once or twice a year. Routine eye exams. Ask your health care provider how often you should have your eyes checked. Personal lifestyle choices, including: Daily care of your teeth and gums. Regular physical activity. Eating a healthy diet. Avoiding tobacco and drug use. Limiting alcohol use. Practicing safe sex. Taking low-dose aspirin every day. Taking vitamin and mineral supplements as recommended by your health care provider. What happens during an annual well check? The services and screenings done by your health care provider during your annual well check will depend on your age, overall health,  lifestyle risk factors, and family history of disease. Counseling  Your health care provider may ask you questions about your: Alcohol use. Tobacco use. Drug use. Emotional well-being. Home and relationship well-being. Sexual activity. Eating habits. History of falls. Memory and ability to understand (cognition). Work and work Statistician. Reproductive health. Screening  You may have the following tests or measurements: Height, weight, and BMI. Blood pressure. Lipid and cholesterol levels. These may be checked every 5 years, or more frequently if you are over 12 years old. Skin check. Lung cancer screening. You may have this screening every year starting at age 35 if you have a 30-pack-year history of smoking and currently smoke or have quit within the past 15 years. Fecal occult blood test (FOBT) of the stool. You may have this test every year starting at age 7. Flexible sigmoidoscopy or colonoscopy. You may have a sigmoidoscopy every 5 years or a colonoscopy every 10 years starting at age 17. Hepatitis C blood test. Hepatitis B blood test. Sexually transmitted disease (STD) testing. Diabetes screening. This is done by checking your blood sugar (glucose) after you have not eaten for a while (fasting). You may have this done every 1-3 years. Bone density scan. This is done to screen for osteoporosis. You may have this done starting at age 75. Mammogram. This may be done every 1-2 years. Talk to your health care provider about how often you should have regular mammograms. Talk with your health care provider about your test results, treatment options, and if necessary, the need for more tests. Vaccines  Your health care provider may recommend certain vaccines, such as: Influenza vaccine. This is recommended every year. Tetanus, diphtheria, and acellular pertussis (Tdap, Td) vaccine. You may need a Td booster every 10 years. Zoster vaccine.  You may need this after age  5. Pneumococcal 13-valent conjugate (PCV13) vaccine. One dose is recommended after age 38. Pneumococcal polysaccharide (PPSV23) vaccine. One dose is recommended after age 93. Talk to your health care provider about which screenings and vaccines you need and how often you need them. This information is not intended to replace advice given to you by your health care provider. Make sure you discuss any questions you have with your health care provider. Document Released: 07/31/2015 Document Revised: 03/23/2016 Document Reviewed: 05/05/2015 Elsevier Interactive Patient Education  2017 [employer] [employer] Prevention in the Home Falls can cause injuries. They can happen to people of all ages. There are many things you can do to make your home safe and to help prevent falls. What can I do on the outside of my home? Regularly fix the edges of walkways and driveways and fix any cracks. Remove anything that might make you trip as you walk through a door, such as a raised step or threshold. Trim any bushes or trees on the path to your home. Use bright outdoor lighting. Clear any walking paths of anything that might make someone trip, such as rocks or tools. Regularly check to see if handrails are loose or broken. Make sure that both sides of any steps have handrails. Any raised decks and porches should have guardrails on the edges. Have any leaves, snow, or ice cleared regularly. Use sand or salt on walking paths during winter. Clean up any spills in your garage right away. This includes oil or grease spills. What can I do in the bathroom? Use night lights. Install grab bars by the toilet and in the tub and shower. Do not use towel bars as grab bars. Use non-skid mats or decals in the tub or shower. If you need to sit down in the shower, use a plastic, non-slip stool. Keep the floor dry. Clean up any water that spills on the floor as soon as it happens. Remove soap buildup in the tub or shower  regularly. Attach bath mats securely with double-sided non-slip rug tape. Do not have throw rugs and other things on the floor that can make you trip. What can I do in the bedroom? Use night lights. Make sure that you have a light by your bed that is easy to reach. Do not use any sheets or blankets that are too big for your bed. They should not hang down onto the floor. Have a firm chair that has side arms. You can use this for support while you get dressed. Do not have throw rugs and other things on the floor that can make you trip. What can I do in the kitchen? Clean up any spills right away. Avoid walking on wet floors. Keep items that you use a lot in easy-to-reach places. If you need to reach something above you, use a strong step stool that has a grab bar. Keep electrical cords out of the way. Do not use floor polish or wax that makes floors slippery. If you must use wax, use non-skid floor wax. Do not have throw rugs and other things on the floor that can make you trip. What can I do with my stairs? Do not leave any items on the stairs. Make sure that there are handrails on both sides of the stairs and use them. Fix handrails that are broken or loose. Make sure that handrails are as long as the stairways. Check any carpeting to make sure that it is  firmly attached to the stairs. Fix any carpet that is loose or worn. Avoid having throw rugs at the top or bottom of the stairs. If you do have throw rugs, attach them to the floor with carpet tape. Make sure that you have a light switch at the top of the stairs and the bottom of the stairs. If you do not have them, ask someone to add them for you. What else can I do to help prevent falls? Wear shoes that: Do not have high heels. Have rubber bottoms. Are comfortable and fit you well. Are closed at the toe. Do not wear sandals. If you use a stepladder: Make sure that it is fully opened. Do not climb a closed stepladder. Make sure that  both sides of the stepladder are locked into place. Ask someone to hold it for you, if possible. Clearly mark and make sure that you can see: Any grab bars or handrails. First and last steps. Where the edge of each step is. Use tools that help you move around (mobility aids) if they are needed. These include: Canes. Walkers. Scooters. Crutches. Turn on the lights when you go into a dark area. Replace any light bulbs as soon as they burn out. Set up your furniture so you have a clear path. Avoid moving your furniture around. If any of your floors are uneven, fix them. If there are any pets around you, be aware of where they are. Review your medicines with your doctor. Some medicines can make you feel dizzy. This can increase your chance of falling. Ask your doctor what other things that you can do to help prevent falls. This information is not intended to replace advice given to you by your health care provider. Make sure you discuss any questions you have with your health care provider. Document Released: 04/30/2009 Document Revised: 12/10/2015 Document Reviewed: 08/08/2014 Elsevier Interactive Patient Education  2017 [employer].

## 2022-04-07 ENCOUNTER — Encounter: Payer: Self-pay | Admitting: Family Medicine

## 2022-04-07 ENCOUNTER — Ambulatory Visit (INDEPENDENT_AMBULATORY_CARE_PROVIDER_SITE_OTHER): Payer: Medicare Other | Admitting: Family Medicine

## 2022-04-07 VITALS — BP 134/84 | HR 59 | Temp 97.5°F | Wt 206.2 lb

## 2022-04-07 DIAGNOSIS — R300 Dysuria: Secondary | ICD-10-CM | POA: Diagnosis not present

## 2022-04-07 DIAGNOSIS — R399 Unspecified symptoms and signs involving the genitourinary system: Secondary | ICD-10-CM | POA: Diagnosis not present

## 2022-04-07 LAB — POCT URINALYSIS DIPSTICK
Bilirubin, UA: NEGATIVE
Blood, UA: NEGATIVE
Glucose, UA: NEGATIVE
Ketones, UA: NEGATIVE
Leukocytes, UA: NEGATIVE
Nitrite, UA: NEGATIVE
Protein, UA: NEGATIVE
Spec Grav, UA: 1.01 (ref 1.010–1.025)
Urobilinogen, UA: 0.2 E.U./dL
pH, UA: 6 (ref 5.0–8.0)

## 2022-04-07 MED ORDER — PHENAZOPYRIDINE HCL 200 MG PO TABS: 200.0000 mg | ORAL_TABLET | Freq: Three times a day (TID) | ORAL | 0 refills | Status: DC | PRN

## 2022-04-07 NOTE — Assessment & Plan Note (Signed)
UA negative, Trial providing Urine culture sent, pending results, can pursue antibiotic therapy ED and return precautions discussed

## 2022-04-07 NOTE — Patient Instructions (Signed)
Urinary Tract Infection, Adult A urinary tract infection (UTI) is an infection of any part of the urinary tract. The urinary tract includes: The kidneys. The ureters. The bladder. The urethra. These organs make, store, and get rid of pee (urine) in the body. What are the causes? This infection is caused by germs (bacteria) in your genital area. These germs grow and cause swelling (inflammation) of your urinary tract. What increases the risk? The following factors may make you more likely to develop this condition: Using a small, thin tube (catheter) to drain pee. Not being able to control when you pee or poop (incontinence). Being female. If you are female, these things can increase the risk: Using these methods to prevent pregnancy: A medicine that kills sperm (spermicide). A device that blocks sperm (diaphragm). Having low levels of a female hormone (estrogen). Being pregnant. You are more likely to develop this condition if: You have genes that add to your risk. You are sexually active. You take antibiotic medicines. You have trouble peeing because of: A prostate that is bigger than normal, if you are female. A blockage in the part of your body that drains pee from the bladder. A kidney stone. A nerve condition that affects your bladder. Not getting enough to drink. Not peeing often enough. You have other conditions, such as: Diabetes. A weak disease-fighting system (immune system). Sickle cell disease. Gout. Injury of the spine. What are the signs or symptoms? Symptoms of this condition include: Needing to pee right away. Peeing small amounts often. Pain or burning when peeing. Blood in the pee. Pee that smells bad or not like normal. Trouble peeing. Pee that is cloudy. Fluid coming from the vagina, if you are female. Pain in the belly or lower back. Other symptoms include: Vomiting. Not feeling hungry. Feeling mixed up (confused). This may be the first symptom in  older adults. Being tired and grouchy (irritable). A fever. Watery poop (diarrhea). How is this treated? Taking antibiotic medicine. Taking other medicines. Drinking enough water. In some cases, you may need to see a specialist. Follow these instructions at home:  Medicines Take over-the-counter and prescription medicines only as told by your doctor. If you were prescribed an antibiotic medicine, take it as told by your doctor. Do not stop taking it even if you start to feel better. General instructions Make sure you: Pee until your bladder is empty. Do not hold pee for a long time. Empty your bladder after sex. Wipe from front to back after peeing or pooping if you are a female. Use each tissue one time when you wipe. Drink enough fluid to keep your pee pale yellow. Keep all follow-up visits. Contact a doctor if: You do not get better after 1-2 days. Your symptoms go away and then come back. Get help right away if: You have very bad back pain. You have very bad pain in your lower belly. You have a fever. You have chills. You feeling like you will vomit or you vomit. Summary A urinary tract infection (UTI) is an infection of any part of the urinary tract. This condition is caused by germs in your genital area. There are many risk factors for a UTI. Treatment includes antibiotic medicines. Drink enough fluid to keep your pee pale yellow. This information is not intended to replace advice given to you by your health care provider. Make sure you discuss any questions you have with your health care provider. Document Revised: 02/14/2020 Document Reviewed: 02/14/2020 Elsevier Patient Education    2023 Elsevier Inc.  

## 2022-04-07 NOTE — Progress Notes (Signed)
Assessment/Plan:   Problem List Items Addressed This Visit       Other   UTI symptoms - Primary    UA negative, Trial providing Urine culture sent, pending results, can pursue antibiotic therapy ED and return precautions discussed      Relevant Orders   POCT Urinalysis Dipstick (Completed)   Urine Culture   Other Visit Diagnoses     Dysuria       Relevant Medications   phenazopyridine (PYRIDIUM) 200 MG tablet          Subjective:  HPI:  Monica Wallace is a 71 y.o. female who has Hyperlipidemia; Migraine; Thyroid cancer (Monica Wallace); DJD (degenerative joint disease); Fibrocystic breast; HTN (hypertension); GERD (gastroesophageal reflux disease); S/P hysterectomy with oophorectomy; Hypothyroidism; Chronic hoarseness; Colon polyp; Hiatal hernia; Irritable bowel syndrome; Moderate persistent asthma; Allergic rhinoconjunctivitis; Plantar fasciitis, bilateral; Pronation deformity of ankle, acquired; Palpitations; OSA (obstructive sleep apnea); Obesity (BMI 30-39.9); Left-sided low back pain with left-sided sciatica; Preventative health care; Controlled type 2 diabetes mellitus without complication, without long-term current use of insulin (Monica Wallace); Insomnia; Hypokalemia; Baker's cyst of knee, right; Educated about COVID-19 virus infection; Right knee pain; COVID-19; Neck pain; Allergic conjunctivitis; Cervical cancer screening; Lumbar radiculopathy; Unsteady gait; Dizziness; Edema; Left ankle pain; Hx of papillary thyroid carcinoma; Postoperative hypothyroidism; and UTI symptoms on their problem list..   She  has a past medical history of Arthritis, Asthma, Baker's cyst of knee, right (02/16/2017), Cancer (Monica Wallace) (2002), Cystic breast, Diabetes (Monica Wallace), GERD (gastroesophageal reflux disease), Hepatic cyst (02/02/2015), Hyperlipidemia, Hypertension, Insomnia (07/23/2016), Menopause, Migraine, Palpitations, Preventative health care (01/18/2016), Sleep apnea (02/02/2015), and Thyroid disease..   She  presents with chief complaint of Other (Frequent, but little urination and lower abdominal pressure. ) .   Urinary symptoms  She reports new onset dysuria. The current episode started  5 days ago and is worsening. Patient states symptoms are moderate in intensity, occurring constantly. She  has not been recently treated for similar symptoms. Taking tylenol.    Associated symptoms: No abdominal pain Yes lower back pain  No chills No constipation  No cramping No diarrhea  Yes discharge, scant No fever  No hematuria No nausea  No vomiting    ---------------------------------------------------------------------------------------   Past Surgical History:  Procedure Laterality Date   ABDOMINAL HYSTERECTOMY     took right ovary and uterus   APPENDECTOMY     CARPAL TUNNEL RELEASE Right    CESAREAN SECTION     CHOLECYSTECTOMY     EYE SURGERY Bilateral 2017   cataracts   KNEE ARTHROPLASTY     SHOULDER ARTHROSCOPY W/ ROTATOR CUFF REPAIR Left    THYROIDECTOMY  2002    Outpatient Medications Prior to Visit  Medication Sig Dispense Refill   acetaminophen (TYLENOL) 325 MG tablet Take 650 mg by mouth every 6 (six) hours as needed.     albuterol (PROVENTIL) (2.5 MG/3ML) 0.083% nebulizer solution Take 3 mLs (2.5 mg total) by nebulization every 6 (six) hours as needed for wheezing or shortness of breath. 75 mL 0   albuterol (VENTOLIN HFA) 108 (90 Base) MCG/ACT inhaler INHALE 2 PUFFS INTO THE LUNGS EVERY 6 HOURS AS NEEDED FOR WHEEZING OR SHORTNESS OF BREATH 25.5 g 1   azelastine (ASTELIN) 0.1 % nasal spray 1-2 sprays per nostril twice daily for runny nose 30 mL 5   Blood Glucose Monitoring Suppl (ONETOUCH VERIO FLEX SYSTEM) w/Device KIT Use to check blood glucose once a day.  DX code: E11.9 1 kit 0  budesonide (PULMICORT FLEXHALER) 180 MCG/ACT inhaler 2 puffs twice daily to prevent coughing or wheezing. 1 each 5   Cholecalciferol ([employer] VIT D 5000 HIGH-POTENCY PO) Take 5,000 Units by mouth daily.      estradiol (ESTRACE VAGINAL) 0.1 MG/GM vaginal cream Place 1 Applicatorful vaginally at bedtime. 42.5 g 12   fluticasone (FLONASE) 50 MCG/ACT nasal spray Place 2 sprays into both nostrils daily. 16 g 5   furosemide (LASIX) 20 MG tablet 1 tab by mouth daily as needed for edema, weight gain >3lbs in 24 hours 30 tablet 3   glucose blood (ONETOUCH VERIO) test strip TEST BLOOD GLUCOSE ONCE A DAY 100 strip 1   hyoscyamine (LEVSIN SL) 0.125 MG SL tablet Place 1 tablet (0.125 mg total) under the tongue every 4 (four) hours as needed. 30 tablet 1   Lancets (ONETOUCH DELICA PLUS [id number]) MISC TEST BLOOD SUGAR ONCE A DAY 100 each 1   levocetirizine (XYZAL) 5 MG tablet Take 1 tablet (5 mg total) by mouth every evening. 30 tablet 5   montelukast (SINGULAIR) 10 MG tablet Take 1 tablet (10 mg total) by mouth at bedtime. 90 tablet 1   Olopatadine HCl 0.2 % SOLN Place 1 drop in each eye once a day as needed for itchy watery eyes 2.5 mL 5   Omega-3 1000 MG CAPS Take 1,000 mg by mouth daily.     omeprazole (PRILOSEC) 40 MG capsule Take 1 capsule by mouth daily.     potassium chloride SA (KLOR-CON M) 20 MEQ tablet TAKE 2 TABLETS BY MOUTH DAILY FOR 3 DAYS. THEN TAKE 1 TABLET BY MOUTH DAILY. 30 tablet 3   rosuvastatin (CRESTOR) 5 MG tablet TAKE 1 TAKE ONE TABLET EVERY__ HOURS TAKE ONE TABLET EVERY__ HOURS TAB BY MOUTH 2 TIMES A WEEK(TUE AND SUN WEEKLY) 25 tablet 1   SYNTHROID 137 MCG tablet Take 1 tablet (137 mcg total) by mouth daily before breakfast. 90 tablet 3   triamterene-hydrochlorothiazide (MAXZIDE-25) 37.5-25 MG tablet TAKE 1 TABLET BY MOUTH DAILY 90 tablet 1   No facility-administered medications prior to visit.    Family History  Problem Relation Age of Onset   Arthritis Mother    Transient ischemic attack Mother    Hypertension Mother    Hyperlipidemia Mother    Diabetes Father    Heart disease Father    Arthritis Father    Kidney disease Father    Hypertension Father    Hyperlipidemia  Father    Cancer Sister 45       pancreatic   Fibromyalgia Sister    Allergic rhinitis Sister    Asthma Maternal Grandfather        smoker   Depression Maternal Grandfather    Dementia Paternal Grandmother    Diabetes Paternal Grandfather    Hypertension Brother    Allergic rhinitis Brother    Scoliosis Daughter        had rods placed and removed   Arthritis Sister    Gout Sister    GER disease Sister    Cancer Sister        breast cancer   Breast cancer Sister 2    Social History   Socioeconomic History   Marital status: Divorced    Spouse name: Not on file   Number of children: 2   Years of education: Not on file   Highest education level: Not on file  Occupational History   Not on file  Tobacco Use   Smoking status: Never  Smokeless tobacco: Never  Vaping Use   Vaping Use: Never used  Substance and Sexual Activity   Alcohol use: Yes    Alcohol/week: 0.0 standard drinks of alcohol    Comment: very rare once or twice a year   Drug use: No   Sexual activity: Yes    Birth control/protection: Other-see comments, Surgical    Comment:  boyfriend  Other Topics Concern   Not on file  Social History Narrative   Household: pt, daughter and sister    Social Determinants of Health   Financial Resource Strain: Medium Risk (11/23/2021)   Overall Financial Resource Strain (CARDIA)    Difficulty of Paying Living Expenses: Somewhat hard  Food Insecurity: No Food Insecurity (04/04/2022)   Hunger Vital Sign    Worried About Running Out of Food in the Last Year: Never true    Ran Out of Food in the Last Year: Never true  Transportation Needs: No Transportation Needs (04/04/2022)   PRAPARE - Hydrologist (Medical): No    Lack of Transportation (Non-Medical): No  Physical Activity: Inactive (02/08/2021)   Exercise Vital Sign    Days of Exercise per Week: 0 days    Minutes of Exercise per Session: 0 min  Stress: No Stress Concern Present  (04/04/2022)   [place]    Feeling of Stress : Not at all  Social Connections: Moderately Isolated (02/08/2021)   Social Connection and Isolation Panel [NHANES]    Frequency of Communication with Friends and Family: More than three times a week    Frequency of Social Gatherings with Friends and Family: More than three times a week    Attends Religious Services: More than 4 times per year    Active Member of [employer] or Organizations: No    Attends Archivist Meetings: Never    Marital Status: Divorced  Human resources officer Violence: Not At Risk (04/04/2022)   Humiliation, Afraid, Rape, and Kick questionnaire    Fear of Current or Ex-Partner: No    Emotionally Abused: No    Physically Abused: No    Sexually Abused: No                                                                                                 Objective:  Physical Exam: BP 134/84 (BP Location: Left Arm, Patient Position: Sitting, Cuff Size: Large)   Pulse (!) 59   Temp (!) 97.5 F (36.4 C) (Temporal)   Wt 206 lb 3.2 oz (93.5 kg)   SpO2 97%   BMI 37.71 kg/m    General: No acute distress. Awake and conversant.  Eyes: Normal conjunctiva, anicteric. Round symmetric pupils.  ENT: Hearing grossly intact. No nasal discharge.  Neck: Neck is supple. No masses or thyromegaly.  Respiratory: Respirations are non-labored. No auditory wheezing.  Skin: Warm. No rashes or ulcers.  GU: No CVA tenderness ABD: Mild suprapubic tenderness, without rebound or guarding, remainder of abdomen nontender Psych: Alert and oriented. Cooperative, Appropriate mood and affect, Normal judgment.  CV: No cyanosis or JVD  MSK: Normal ambulation. No clubbing  Neuro: Sensation and CN II-XII grossly normal.   Urine dipstick shows negative for all components.      Alesia Banda, MD, MS

## 2022-04-22 ENCOUNTER — Ambulatory Visit (INDEPENDENT_AMBULATORY_CARE_PROVIDER_SITE_OTHER): Payer: Medicare Other | Admitting: Internal Medicine

## 2022-04-22 ENCOUNTER — Encounter: Payer: Self-pay | Admitting: Internal Medicine

## 2022-04-22 VITALS — BP 126/66 | HR 83 | Temp 98.2°F | Resp 18 | Ht 62.0 in | Wt 205.7 lb

## 2022-04-22 DIAGNOSIS — J4530 Mild persistent asthma, uncomplicated: Secondary | ICD-10-CM | POA: Diagnosis not present

## 2022-04-22 DIAGNOSIS — H1013 Acute atopic conjunctivitis, bilateral: Secondary | ICD-10-CM | POA: Diagnosis not present

## 2022-04-22 DIAGNOSIS — J3089 Other allergic rhinitis: Secondary | ICD-10-CM

## 2022-04-22 DIAGNOSIS — H6993 Unspecified Eustachian tube disorder, bilateral: Secondary | ICD-10-CM

## 2022-04-22 DIAGNOSIS — K219 Gastro-esophageal reflux disease without esophagitis: Secondary | ICD-10-CM

## 2022-04-22 MED ORDER — ALBUTEROL SULFATE HFA 108 (90 BASE) MCG/ACT IN AERS
2.0000 | INHALATION_SPRAY | Freq: Four times a day (QID) | RESPIRATORY_TRACT | 2 refills | Status: DC | PRN
Start: 2022-04-22 — End: 2022-05-04

## 2022-04-22 MED ORDER — PULMICORT FLEXHALER 180 MCG/ACT IN AEPB: INHALATION_SPRAY | RESPIRATORY_TRACT | 5 refills | Status: DC

## 2022-04-22 MED ORDER — IPRATROPIUM BROMIDE 0.03 % NA SOLN: 2.0000 | Freq: Two times a day (BID) | NASAL | 12 refills | Status: DC | PRN

## 2022-04-22 MED ORDER — AZELASTINE HCL 0.1 % NA SOLN
NASAL | 5 refills | Status: DC
Start: 2022-04-22 — End: 2022-11-03

## 2022-04-22 MED ORDER — FLUTICASONE PROPIONATE 50 MCG/ACT NA SUSP: 2.0000 | Freq: Every day | NASAL | 5 refills | Status: DC

## 2022-04-22 NOTE — Patient Instructions (Addendum)
Mild persistent asthma Continue Pulmicort Flexhaler  180 mcg 2 puffs once a day to help prevent cough and wheeze Continue montelukast (Singulair) 10 mg once a day to help prevent cough and wheeze May use albuterol 2 puffs every 4-6 hours as needed for cough, wheeze, tightness in chest, shortness of breath.  Also may use albuterol 2 puffs 5 to 15 minutes prior to exercise Continue to follow up with pulmonology   Allergic rhinoconjunctivitis (2021 skin testing shows intradermal's mildly reactive to grass pollen, tree pollen, mold mix #4, and dust mite) with eustachian tube dysfunction  For nasal symptoms:  First use sinus rinse-use twice daily. Continue saline gel for nasal dryness Next use medicated nasal sprays: Continue azelastine nasal spray 1-2 spray each nostril twice a day as needed for runny nose/stuffy nose/drainage down throat. Caution as this can be drying Continue fluticasone 1-2 sprays in each nostril once a day as needed for stuffy nose Atrovent 1-2 sprays in each nostril twice daily as needed. for post nasal drainage, and runny nose. If nose becomes too dry, please use less frequently May use Pataday 1 drop each eye once a day as needed for itchy watery eyes.  Xyzal (levocetirizine) 5 mg once a day as needed for runny nose   Prednisone pack-20 mg daily for 4 days, and 10 mg daily on day 5 Follow-up with Dr. Benjamine Mola  Reflux Continue omeprazole 40 mg once a day to help prevent cough and wheeze Continue follow-up with GI  Please let us know if this treatment plan is not working well for you Schedule a follow-up appointment in 6 months or sooner if needed

## 2022-04-22 NOTE — Progress Notes (Addendum)
FOLLOW UP Date of Service/Encounter:  04/22/22   Subjective:  Monica Wallace (DOB: [birth year]) is a 71 y.o. female with PMHx of HTN, HL, hypothyroidism, OSA who returns to the [facility] on 04/22/2022 in re-evaluation of the following: moderate persistent asthma, allergic rhinitis, allergic conjunctivitis, and recurrent sinus infections.  History obtained from: chart review and patient.  For Review, LV was on 12/21/21  with Althea Charon, FNP seen for routine follow-up.  She has a restrictive lung pattern and is now following with Pulmonary. Her asthm awas controlled with pulmicort flexhaler.   She has had sinus surgery in the past with Dr. Benjamine Mola and follows with GI for reflux.  Today presents for follow-up. She reports coughing with increased drainage and sore throat. She is having lots of stuffy nose, congestion and drianage going down her throat. This has been ongoing for past 5 days. She is having coughing due to drainage. She is using flonase and azelastine as prescribed, not as much nasal sinuse rinses because she is working and sometimes forget. She is having frequent imbalance and she is taking a decongestant from time to time and it does help. She has had vertigo.  She is not getting adequate fluids.  She works as a Production designer, theatre/television/film, and does not have time to go to the bathroom so restricts her fluids on purpose. She can not remember the last time she needed albuterol, but none since last visit. She had strep throat late spring/summer and she did get antibiotics for this as well steroids but none since.   Allergies as of 04/22/2022       Reactions   Levaquin [levofloxacin] Shortness Of Breath   Penicillins Other (See Comments)   Positive on allergy testing 'years ago.' Has never had penicillin. Has tolerated amoxicillin.   Clindamycin/lincomycin Other (See Comments)   Pruritus No rash   Codeine Itching   Gabapentin Itching        Medication List         Accurate as of April 22, 2022  4:14 PM. If you have any questions, ask your nurse or doctor.          acetaminophen 325 MG tablet Commonly known as: TYLENOL Take 650 mg by mouth every 6 (six) hours as needed.   albuterol (2.5 MG/3ML) 0.083% nebulizer solution Commonly known as: PROVENTIL Take 3 mLs (2.5 mg total) by nebulization every 6 (six) hours as needed for wheezing or shortness of breath.   albuterol 108 (90 Base) MCG/ACT inhaler Commonly known as: VENTOLIN HFA Inhale 2 puffs into the lungs every 6 (six) hours as needed for wheezing or shortness of breath.   azelastine 0.1 % nasal spray Commonly known as: ASTELIN 1-2 sprays per nostril twice daily for runny nose   [employer] VIT D 5000 HIGH-POTENCY PO Take 5,000 Units by mouth daily.   estradiol 0.1 MG/GM vaginal cream Commonly known as: ESTRACE VAGINAL Place 1 Applicatorful vaginally at bedtime.   fluticasone 50 MCG/ACT nasal spray Commonly known as: FLONASE Place 2 sprays into both nostrils daily.   furosemide 20 MG tablet Commonly known as: LASIX 1 tab by mouth daily as needed for edema, weight gain >3lbs in 24 hours   hyoscyamine 0.125 MG SL tablet Commonly known as: LEVSIN SL Place 1 tablet (0.125 mg total) under the tongue every 4 (four) hours as needed.   ipratropium 0.03 % nasal spray Commonly known as: ATROVENT Place 2 sprays into both nostrils 2 (two) times daily as  needed for rhinitis. Use less frequently if you become too dry. Started by: Clemon Chambers, MD   levocetirizine 5 MG tablet Commonly known as: XYZAL Take 1 tablet (5 mg total) by mouth every evening.   montelukast 10 MG tablet Commonly known as: SINGULAIR Take 1 tablet (10 mg total) by mouth at bedtime.   Olopatadine HCl 0.2 % Soln Place 1 drop in each eye once a day as needed for itchy watery eyes   Omega-3 1000 MG Caps Take 1,000 mg by mouth daily.   omeprazole 40 MG capsule Commonly known as: PRILOSEC Take 1 capsule by mouth  daily.   OneTouch Delica Plus [id number] Misc TEST BLOOD SUGAR ONCE A DAY   OneTouch Verio Flex System w/Device Kit Use to check blood glucose once a day.  DX code: E11.9   OneTouch Verio test strip Generic drug: glucose blood TEST BLOOD GLUCOSE ONCE A DAY   phenazopyridine 200 MG tablet Commonly known as: Pyridium Take 1 tablet (200 mg total) by mouth 3 (three) times daily as needed for pain.   potassium chloride SA 20 MEQ tablet Commonly known as: KLOR-CON M TAKE 2 TABLETS BY MOUTH DAILY FOR 3 DAYS. THEN TAKE 1 TABLET BY MOUTH DAILY.   Pulmicort Flexhaler 180 MCG/ACT inhaler Generic drug: budesonide 2 puffs twice daily to prevent coughing or wheezing.   rosuvastatin 5 MG tablet Commonly known as: CRESTOR TAKE 1 TAKE ONE TABLET EVERY__ HOURS TAKE ONE TABLET EVERY__ HOURS TAB BY MOUTH 2 TIMES A WEEK(TUE AND SUN WEEKLY)   Synthroid 137 MCG tablet Generic drug: levothyroxine Take 1 tablet (137 mcg total) by mouth daily before breakfast.   triamterene-hydrochlorothiazide 37.5-25 MG tablet Commonly known as: MAXZIDE-25 TAKE 1 TABLET BY MOUTH DAILY       Past Medical History:  Diagnosis Date   Arthritis    Asthma    Baker's cyst of knee, right 02/16/2017   Cancer ([place]) 2002   thyroid (2002)   Cystic breast    Diabetes ([place])    GERD (gastroesophageal reflux disease)    Hepatic cyst 02/02/2015   Hyperlipidemia    Hypertension    Insomnia 07/23/2016   Menopause    Migraine    Palpitations    Preventative health care 01/18/2016   Sleep apnea 02/02/2015   Thyroid disease    Past Surgical History:  Procedure Laterality Date   ABDOMINAL HYSTERECTOMY     took right ovary and uterus   APPENDECTOMY     CARPAL TUNNEL RELEASE Right    CESAREAN SECTION     CHOLECYSTECTOMY     EYE SURGERY Bilateral 2017   cataracts   KNEE ARTHROPLASTY     SHOULDER ARTHROSCOPY W/ ROTATOR CUFF REPAIR Left    THYROIDECTOMY  2002   Otherwise, there have been no changes to her past  medical history, surgical history, family history, or social history.  ROS: All others negative except as noted per HPI.   Objective:  BP 126/66   Pulse 83   Temp 98.2 F (36.8 C) (Temporal)   Resp 18   Ht 5' 2"  (1.575 m)   Wt 205 lb 11.2 oz (93.3 kg)   SpO2 97%   BMI 37.62 kg/m  Body mass index is 37.62 kg/m. Physical Exam: General Appearance:  Alert, cooperative, no distress, appears stated age  Head:  Normocephalic, without obvious abnormality, atraumatic  Eyes:  Conjunctiva clear, EOM's intact  Nose: Nares normal, normal mucosa, no visible anterior polyps, and septum midline  Throat:  Lips, tongue normal; teeth and gums normal, normal posterior oropharynx  Neck: Supple, symmetrical  Lungs:   clear to auscultation bilaterally, Respirations unlabored, no coughing  Heart:  regular rate and rhythm and no murmur, Appears well perfused  Extremities: No edema  Skin: Skin color, texture, turgor normal, no rashes or lesions on visualized portions of skin  Neurologic: No gross deficits  Spirometry:  Tracings reviewed. Her effort: Good reproducible efforts. FVC: 1.78L FEV1: 1.38L, 80% predicted FEV1/FVC ratio: 0.78 Interpretation: Spirometry consistent with normal pattern.  Please see scanned spirometry results for details.  Assessment/Plan   Mild persistent asthma-controlled Continue Pulmicort Flexhaler  180 mcg 2 puffs once a day to help prevent cough and wheeze Continue montelukast (Singulair) 10 mg once a day to help prevent cough and wheeze May use albuterol 2 puffs every 4-6 hours as needed for cough, wheeze, tightness in chest, shortness of breath.  Also may use albuterol 2 puffs 5 to 15 minutes prior to exercise Continue to follow up with pulmonology   Allergic rhinoconjunctivitis (2021 skin testing shows intradermal's mildly reactive to grass pollen, tree pollen, mold mix #4, and dust mite) with eustachian tube dysfunction-not controlled  For nasal symptoms:  First  use sinus rinse-use twice daily. Continue saline gel for nasal dryness Next use medicated nasal sprays: Continue azelastine nasal spray 1-2 spray each nostril twice a day as needed for runny nose/stuffy nose/drainage down throat. Caution as this can be drying Continue fluticasone 1-2 sprays in each nostril once a day as needed for stuffy nose Atrovent 1-2 sprays in each nostril twice daily as needed. for post nasal drainage, and runny nose. If nose becomes too dry, please use less frequently May use Pataday 1 drop each eye once a day as needed for itchy watery eyes.  Xyzal (levocetirizine) 5 mg once a day as needed for runny nose   Prednisone pack-20 mg daily for 4 days, and 10 mg daily on day 5 Follow-up with Dr. Benjamine Mola  Reflux-controlled Continue omeprazole 40 mg once a day to help prevent cough and wheeze Continue follow-up with GI  Please let us know if this treatment plan is not working well for you Schedule a follow-up appointment in 6 months or sooner if needed  Sigurd Sos, MD  [facility] [place] [facility] [place]

## 2022-05-02 ENCOUNTER — Encounter: Payer: Self-pay | Admitting: Internal Medicine

## 2022-05-04 ENCOUNTER — Ambulatory Visit (INDEPENDENT_AMBULATORY_CARE_PROVIDER_SITE_OTHER): Payer: Medicare Other | Admitting: Family Medicine

## 2022-05-04 ENCOUNTER — Encounter: Payer: Self-pay | Admitting: Family Medicine

## 2022-05-04 VITALS — BP 130/76 | HR 89 | Temp 98.8°F | Ht 62.0 in | Wt 209.4 lb

## 2022-05-04 DIAGNOSIS — J4531 Mild persistent asthma with (acute) exacerbation: Secondary | ICD-10-CM | POA: Diagnosis not present

## 2022-05-04 MED ORDER — ALBUTEROL SULFATE HFA 108 (90 BASE) MCG/ACT IN AERS: 2.0000 | INHALATION_SPRAY | Freq: Four times a day (QID) | RESPIRATORY_TRACT | 2 refills | Status: DC | PRN

## 2022-05-04 MED ORDER — PREDNISONE 20 MG PO TABS: 40.0000 mg | ORAL_TABLET | Freq: Every day | ORAL | 0 refills | Status: AC

## 2022-05-04 MED ORDER — ALBUTEROL SULFATE (2.5 MG/3ML) 0.083% IN NEBU: 2.5000 mg | INHALATION_SOLUTION | Freq: Four times a day (QID) | RESPIRATORY_TRACT | 0 refills | Status: DC | PRN

## 2022-05-04 NOTE — Patient Instructions (Signed)
Continue to push fluids, practice good hand hygiene, and cover your mouth if you cough.  If you start having fevers, shaking or shortness of breath, seek immediate care.  OK to take Tylenol 1000 mg (2 extra strength tabs) or 975 mg (3 regular strength tabs) every 6 hours as needed.  Send me a message in 2 days if we aren't turning the corner.   Let us know if you need anything.

## 2022-05-04 NOTE — Progress Notes (Signed)
Chief Complaint  Patient presents with   Cough   Wheezing    Mild SOB     Clemens Catholic here for URI complaints.  Duration: 5 days; ate later than usually 5 d ago and had a reflux episode flaring her asthma Associated symptoms: rhinorrhea, sore throat, wheezing, shortness of breath, chest tightness, and coughing Denies: sinus congestion, sinus pain, ear pain, ear drainage, myalgia, and fevers Hx of asthma.  Treatment to date: Laurence Ferrari helps Sick contacts: No  Past Medical History:  Diagnosis Date   Arthritis    Asthma    Baker's cyst of knee, right 02/16/2017   Cancer ([place]) 2002   thyroid (2002)   Cystic breast    Diabetes ([place])    GERD (gastroesophageal reflux disease)    Hepatic cyst 02/02/2015   Hyperlipidemia    Hypertension    Insomnia 07/23/2016   Menopause    Migraine    Palpitations    Preventative health care 01/18/2016   Sleep apnea 02/02/2015   Thyroid disease     Objective BP 130/76 (BP Location: Left Arm, Cuff Size: Normal)   Pulse 89   Temp 98.8 F (37.1 C) (Oral)   Ht '5\' 2"'$  (1.575 m)   Wt 209 lb 6 oz (95 kg)   SpO2 95%   BMI 38.30 kg/m  General: Awake, alert, appears stated age HEENT: AT, [place], ears patent b/l and TM's neg, nares patent w/o discharge, pharynx pink and without exudates, MMM Neck: No masses or asymmetry Heart: RRR Lungs: Diffuse expiratory wheezing, no accessory muscle use Psych: Age appropriate judgment and insight, normal mood and affect  Mild persistent asthma with acute exacerbation - Plan: predniSONE (DELTASONE) 20 MG tablet  Exacerbation of chronic issue.  5-day present burst 4 mg daily.  If no improvement in 2 days, she will send a message and we will send in an antibiotic but do not think she will need this.  Due to reflux causing this, we will send in Augmentin to cover for aspiration better.  Continue to push fluids, practice good hand hygiene, cover mouth when coughing. F/u prn. If starting to experience fevers, shaking,  or shortness of breath, seek immediate care. Pt voiced understanding and agreement to the plan.  [place], DO 05/04/22 3:40 PM

## 2022-05-05 ENCOUNTER — Telehealth: Payer: Self-pay | Admitting: Internal Medicine

## 2022-05-05 ENCOUNTER — Other Ambulatory Visit: Payer: Self-pay | Admitting: *Deleted

## 2022-05-05 MED ORDER — ALBUTEROL SULFATE (2.5 MG/3ML) 0.083% IN NEBU: 2.5000 mg | INHALATION_SOLUTION | Freq: Four times a day (QID) | RESPIRATORY_TRACT | 0 refills | Status: DC | PRN

## 2022-05-05 MED ORDER — ALBUTEROL SULFATE HFA 108 (90 BASE) MCG/ACT IN AERS: 2.0000 | INHALATION_SPRAY | Freq: Four times a day (QID) | RESPIRATORY_TRACT | 1 refills | Status: DC | PRN

## 2022-05-05 NOTE — Telephone Encounter (Signed)
Please call patient to see how she is doing, and let her know that I have been out of town until today.  I apologize that her message was not addressed sooner. If she had a reflux event and is coughing, she could have aspiration pneumonia.  We can certainly call in her albuterol inhaler and nebulizer solutions for her to have on hand, but she may require an in person visit if she is not improving. She should also reach out to her GI specialist if her reflux is not controlled.

## 2022-05-05 NOTE — Telephone Encounter (Signed)
Pt states she followed up with her PCP and he refilled her inhaler.

## 2022-05-06 NOTE — Telephone Encounter (Signed)
Thanks for update

## 2022-05-11 NOTE — Assessment & Plan Note (Signed)
hgba1c acceptable, minimize simple carbs. Increase exercise as tolerated. Continue current meds 

## 2022-05-11 NOTE — Assessment & Plan Note (Signed)
Supplement and monitor 

## 2022-05-11 NOTE — Assessment & Plan Note (Signed)
Encourage heart healthy diet such as MIND or DASH diet, increase exercise, avoid trans fats, simple carbohydrates and processed foods, consider a krill or fish or flaxseed oil cap daily.  Tolerating Rosuvastatin 

## 2022-05-11 NOTE — Assessment & Plan Note (Signed)
Well controlled, no changes to meds. Encouraged heart healthy diet such as the DASH diet and exercise as tolerated.  °

## 2022-05-12 ENCOUNTER — Ambulatory Visit (INDEPENDENT_AMBULATORY_CARE_PROVIDER_SITE_OTHER): Payer: Medicare Other | Admitting: Family Medicine

## 2022-05-12 DIAGNOSIS — I10 Essential (primary) hypertension: Secondary | ICD-10-CM | POA: Diagnosis not present

## 2022-05-12 DIAGNOSIS — E119 Type 2 diabetes mellitus without complications: Secondary | ICD-10-CM | POA: Diagnosis not present

## 2022-05-12 DIAGNOSIS — E890 Postprocedural hypothyroidism: Secondary | ICD-10-CM

## 2022-05-12 DIAGNOSIS — J329 Chronic sinusitis, unspecified: Secondary | ICD-10-CM | POA: Diagnosis not present

## 2022-05-12 DIAGNOSIS — E7800 Pure hypercholesterolemia, unspecified: Secondary | ICD-10-CM

## 2022-05-12 DIAGNOSIS — K219 Gastro-esophageal reflux disease without esophagitis: Secondary | ICD-10-CM | POA: Diagnosis not present

## 2022-05-12 LAB — COMPREHENSIVE METABOLIC PANEL
ALT: 28 U/L (ref 0–35)
AST: 20 U/L (ref 0–37)
Albumin: 3.9 g/dL (ref 3.5–5.2)
Alkaline Phosphatase: 74 U/L (ref 39–117)
BUN: 13 mg/dL (ref 6–23)
CO2: 31 mEq/L (ref 19–32)
Calcium: 9.6 mg/dL (ref 8.4–10.5)
Chloride: 97 mEq/L (ref 96–112)
Creatinine, Ser: 0.85 mg/dL (ref 0.40–1.20)
GFR: 68.06 mL/min (ref >60.00)
Glucose, Bld: 94 mg/dL (ref 70–99)
Potassium: 3.5 mEq/L (ref 3.5–5.1)
Sodium: 137 mEq/L (ref 135–145)
Total Bilirubin: 0.3 mg/dL (ref 0.2–1.2)
Total Protein: 6.7 g/dL (ref 6.0–8.3)

## 2022-05-12 LAB — CBC
HCT: 39.4 % (ref 36.0–46.0)
Hemoglobin: 12.7 g/dL (ref 12.0–15.0)
MCHC: 32.1 g/dL (ref 30.0–36.0)
MCV: 90.6 fl (ref 78.0–100.0)
Platelets: 318 10*3/uL (ref 150.0–400.0)
RBC: 4.35 Mil/uL (ref 3.87–5.11)
RDW: 13.6 % (ref 11.5–15.5)
WBC: 13.2 10*3/uL — ABNORMAL HIGH (ref 4.0–10.5)

## 2022-05-12 LAB — LIPID PANEL
Cholesterol: 189 mg/dL (ref 0–200)
HDL: 55.7 mg/dL (ref >39.00)
NonHDL: 132.93
Total CHOL/HDL Ratio: 3
Triglycerides: 220 mg/dL — ABNORMAL HIGH (ref 0.0–149.0)
VLDL: 44 mg/dL — ABNORMAL HIGH (ref 0.0–40.0)

## 2022-05-12 LAB — LDL CHOLESTEROL, DIRECT: Direct LDL: 105 mg/dL

## 2022-05-12 LAB — TSH: TSH: 0.19 u[IU]/mL — ABNORMAL LOW (ref 0.35–5.50)

## 2022-05-12 LAB — HEMOGLOBIN A1C: Hgb A1c MFr Bld: 6.7 % — ABNORMAL HIGH (ref 4.6–6.5)

## 2022-05-12 MED ORDER — FAMOTIDINE 40 MG PO TABS: 40.0000 mg | ORAL_TABLET | Freq: Every day | ORAL | 3 refills | Status: DC

## 2022-05-12 MED ORDER — NYSTATIN 100000 UNIT/GM EX CREA: 1.0000 | TOPICAL_CREAM | Freq: Two times a day (BID) | CUTANEOUS | 0 refills | Status: DC

## 2022-05-12 MED ORDER — OMEPRAZOLE 40 MG PO CPDR: 40.0000 mg | DELAYED_RELEASE_CAPSULE | Freq: Every day | ORAL | 2 refills | Status: DC

## 2022-05-12 MED ORDER — DOXYCYCLINE HYCLATE 100 MG PO TABS: 100.0000 mg | ORAL_TABLET | Freq: Two times a day (BID) | ORAL | 0 refills | Status: DC

## 2022-05-12 MED ORDER — FLUCONAZOLE 150 MG PO TABS: ORAL_TABLET | ORAL | 0 refills | Status: DC

## 2022-05-12 NOTE — Patient Instructions (Signed)
RSV (respiratory syncitial virus) vaccine at pharmacy, Arexvy Covid booster new version at pharmacy High dose flu shot

## 2022-05-12 NOTE — Progress Notes (Signed)
Subjective:   By signing my name below, I, [name], attest that this documentation has been prepared under the direction and in the presence of [name], MD., 05/12/2022.     Patient ID: Monica Wallace, female    DOB: 26-Feb-1951, 71 y.o.   MRN: [id number]  Chief Complaint  Patient presents with   Follow-up    Here for follow up   HPI Patient is in today for an office visit.  Wheezing: She reports that she saw Dr. Riki Sheer on 05/04/2022 due to mild asthma with acute exacerbation. Her associated symptoms were coughing, chest tightness, rhinorrhea, shortness of breath, sore throat, and wheezing. She is still experiencing congestion, rhinorrhea, and wheezing, as well as dizziness, headaches, and arthritic pain in her neck. She currently uses Albuterol and Pulmicort inhalers. She says that her appetite is regular.  Immunizations: She has been informed about receiving COVID-19, high-dose Flu, and RSV immunizations.   Left Breast Rash: She states that she has a rash beneath her left breast.  Past Medical History:  Diagnosis Date   Arthritis    Asthma    Baker's cyst of knee, right 02/16/2017   Cancer ([place]) 2002   thyroid (2002)   Cystic breast    Diabetes ([place])    GERD (gastroesophageal reflux disease)    Hepatic cyst 02/02/2015   Hyperlipidemia    Hypertension    Insomnia 07/23/2016   Menopause    Migraine    Palpitations    Preventative health care 01/18/2016   Sleep apnea 02/02/2015   Thyroid disease    Past Surgical History:  Procedure Laterality Date   ABDOMINAL HYSTERECTOMY     took right ovary and uterus   APPENDECTOMY     CARPAL TUNNEL RELEASE Right    CESAREAN SECTION     CHOLECYSTECTOMY     EYE SURGERY Bilateral 2017   cataracts   KNEE ARTHROPLASTY     SHOULDER ARTHROSCOPY W/ ROTATOR CUFF REPAIR Left    THYROIDECTOMY  2002   Family History  Problem Relation Age of Onset   Arthritis Mother    Transient ischemic attack Mother     Hypertension Mother    Hyperlipidemia Mother    Diabetes Father    Heart disease Father    Arthritis Father    Kidney disease Father    Hypertension Father    Hyperlipidemia Father    Cancer Sister 53       pancreatic   Fibromyalgia Sister    Allergic rhinitis Sister    Asthma Maternal Grandfather        smoker   Depression Maternal Grandfather    Dementia Paternal Grandmother    Diabetes Paternal Grandfather    Hypertension Brother    Allergic rhinitis Brother    Scoliosis Daughter        had rods placed and removed   Arthritis Sister    Gout Sister    GER disease Sister    Cancer Sister        breast cancer   Breast cancer Sister 26   Social History   Socioeconomic History   Marital status: Divorced    Spouse name: Not on file   Number of children: 2   Years of education: Not on file   Highest education level: Not on file  Occupational History   Not on file  Tobacco Use   Smoking status: Never   Smokeless tobacco: Never  Vaping Use   Vaping Use: Never  used  Substance and Sexual Activity   Alcohol use: Yes    Alcohol/week: 0.0 standard drinks of alcohol    Comment: very rare once or twice a year   Drug use: No   Sexual activity: Yes    Birth control/protection: Other-see comments, Surgical    Comment:  boyfriend  Other Topics Concern   Not on file  Social History Narrative   Household: pt, daughter and sister    Social Determinants of Health   Financial Resource Strain: Medium Risk (11/23/2021)   Overall Financial Resource Strain (CARDIA)    Difficulty of Paying Living Expenses: Somewhat hard  Food Insecurity: No Food Insecurity (04/04/2022)   Hunger Vital Sign    Worried About Running Out of Food in the Last Year: Never true    Ran Out of Food in the Last Year: Never true  Transportation Needs: No Transportation Needs (04/04/2022)   PRAPARE - Hydrologist (Medical): No    Lack of Transportation (Non-Medical): No  Physical  Activity: Inactive (02/08/2021)   Exercise Vital Sign    Days of Exercise per Week: 0 days    Minutes of Exercise per Session: 0 min  Stress: No Stress Concern Present (04/04/2022)   [place]    Feeling of Stress : Not at all  Social Connections: Moderately Isolated (02/08/2021)   Social Connection and Isolation Panel [NHANES]    Frequency of Communication with Friends and Family: More than three times a week    Frequency of Social Gatherings with Friends and Family: More than three times a week    Attends Religious Services: More than 4 times per year    Active Member of [employer] or Organizations: No    Attends Archivist Meetings: Never    Marital Status: Divorced  Human resources officer Violence: Not At Risk (04/04/2022)   Humiliation, Afraid, Rape, and Kick questionnaire    Fear of Current or Ex-Partner: No    Emotionally Abused: No    Physically Abused: No    Sexually Abused: No   Outpatient Medications Prior to Visit  Medication Sig Dispense Refill   acetaminophen (TYLENOL) 325 MG tablet Take 650 mg by mouth every 6 (six) hours as needed.     albuterol (PROVENTIL) (2.5 MG/3ML) 0.083% nebulizer solution Take 3 mLs (2.5 mg total) by nebulization every 6 (six) hours as needed for wheezing or shortness of breath. 75 mL 0   albuterol (VENTOLIN HFA) 108 (90 Base) MCG/ACT inhaler Inhale 2 puffs into the lungs every 6 (six) hours as needed for wheezing or shortness of breath. 18 g 1   azelastine (ASTELIN) 0.1 % nasal spray 1-2 sprays per nostril twice daily for runny nose 30 mL 5   Blood Glucose Monitoring Suppl ([place]) w/Device KIT Use to check blood glucose once a day.  DX code: E11.9 1 kit 0   budesonide (PULMICORT FLEXHALER) 180 MCG/ACT inhaler 2 puffs twice daily to prevent coughing or wheezing. 1 each 5   Cholecalciferol ([employer] VIT D 5000 HIGH-POTENCY PO) Take 5,000 Units by mouth daily.      estradiol (ESTRACE VAGINAL) 0.1 MG/GM vaginal cream Place 1 Applicatorful vaginally at bedtime. 42.5 g 12   fluticasone (FLONASE) 50 MCG/ACT nasal spray Place 2 sprays into both nostrils daily. 16 g 5   furosemide (LASIX) 20 MG tablet 1 tab by mouth daily as needed for edema, weight gain >3lbs in 24 hours 30  tablet 3   glucose blood (ONETOUCH VERIO) test strip TEST BLOOD GLUCOSE ONCE A DAY 100 strip 1   hyoscyamine (LEVSIN SL) 0.125 MG SL tablet Place 1 tablet (0.125 mg total) under the tongue every 4 (four) hours as needed. 30 tablet 1   ipratropium (ATROVENT) 0.03 % nasal spray Place 2 sprays into both nostrils 2 (two) times daily as needed for rhinitis. Use less frequently if you become too dry. 30 mL 12   Lancets (ONETOUCH DELICA PLUS ENIDPO24M) MISC TEST BLOOD SUGAR ONCE A DAY 100 each 1   levocetirizine (XYZAL) 5 MG tablet Take 1 tablet (5 mg total) by mouth every evening. 30 tablet 5   montelukast (SINGULAIR) 10 MG tablet Take 1 tablet (10 mg total) by mouth at bedtime. 90 tablet 1   Olopatadine HCl 0.2 % SOLN Place 1 drop in each eye once a day as needed for itchy watery eyes 2.5 mL 5   Omega-3 1000 MG CAPS Take 1,000 mg by mouth daily.     phenazopyridine (PYRIDIUM) 200 MG tablet Take 1 tablet (200 mg total) by mouth 3 (three) times daily as needed for pain. 10 tablet 0   potassium chloride SA (KLOR-CON M) 20 MEQ tablet TAKE 2 TABLETS BY MOUTH DAILY FOR 3 DAYS. THEN TAKE 1 TABLET BY MOUTH DAILY. 30 tablet 3   rosuvastatin (CRESTOR) 5 MG tablet TAKE 1 TAKE ONE TABLET EVERY__ HOURS TAKE ONE TABLET EVERY__ HOURS TAB BY MOUTH 2 TIMES A WEEK(TUE AND SUN WEEKLY) 25 tablet 1   SYNTHROID 137 MCG tablet Take 1 tablet (137 mcg total) by mouth daily before breakfast. 90 tablet 3   triamterene-hydrochlorothiazide (MAXZIDE-25) 37.5-25 MG tablet TAKE 1 TABLET BY MOUTH DAILY 90 tablet 1   omeprazole (PRILOSEC) 40 MG capsule Take 1 capsule by mouth daily.     No facility-administered medications prior to  visit.   Allergies  Allergen Reactions   Levaquin [Levofloxacin] Shortness Of Breath   Penicillins Other (See Comments)    Positive on allergy testing 'years ago.' Has never had penicillin. Has tolerated amoxicillin.   Clindamycin/Lincomycin Other (See Comments)    Pruritus No rash   Codeine Itching   Gabapentin Itching   Review of Systems  HENT:  Positive for congestion.   Respiratory:  Positive for cough and wheezing.   Skin:  Positive for rash (left breast).      Objective:    Physical Exam Constitutional:      General: She is not in acute distress.    Appearance: Normal appearance. She is not ill-appearing.  HENT:     Head: Normocephalic and atraumatic.     Right Ear: External ear normal. Tympanic membrane is erythematous.     Left Ear: External ear normal.     Mouth/Throat:     Mouth: Mucous membranes are dry.     Pharynx: Oropharynx is clear. Posterior oropharyngeal erythema present.  Eyes:     Extraocular Movements: Extraocular movements intact.     Pupils: Pupils are equal, round, and reactive to light.  Cardiovascular:     Rate and Rhythm: Normal rate and regular rhythm.     Pulses: Normal pulses.     Heart sounds: Normal heart sounds. No murmur heard.    No gallop.  Pulmonary:     Effort: Pulmonary effort is normal. No respiratory distress.     Breath sounds: Wheezing present. No rales.  Abdominal:     General: Bowel sounds are normal.  Skin:  General: Skin is warm and dry.  Neurological:     Mental Status: She is alert and oriented to person, place, and time.  Psychiatric:        Mood and Affect: Mood normal.        Behavior: Behavior normal.        Judgment: Judgment normal.    BP (!) 142/80 (BP Location: Right Arm, Patient Position: Sitting, Cuff Size: Normal)   Pulse 80   Temp 98 F (36.7 C) (Oral)   Resp 16   Ht 5' 2"  (1.575 m)   Wt 208 lb 6.4 oz (94.5 kg)   SpO2 96%   BMI 38.12 kg/m  Wt Readings from Last 3 Encounters:  05/12/22 208  lb 6.4 oz (94.5 kg)  05/04/22 209 lb 6 oz (95 kg)  04/22/22 205 lb 11.2 oz (93.3 kg)   Diabetic Foot Exam - Simple   No data filed    Lab Results  Component Value Date   WBC 13.2 (H) 05/12/2022   HGB 12.7 05/12/2022   HCT 39.4 05/12/2022   PLT 318.0 05/12/2022   GLUCOSE 94 05/12/2022   CHOL 189 05/12/2022   TRIG 220.0 (H) 05/12/2022   HDL 55.70 05/12/2022   LDLDIRECT 105.0 05/12/2022   LDLCALC 104 (H) 02/08/2022   ALT 28 05/12/2022   AST 20 05/12/2022   NA 137 05/12/2022   K 3.5 05/12/2022   CL 97 05/12/2022   CREATININE 0.85 05/12/2022   BUN 13 05/12/2022   CO2 31 05/12/2022   TSH 0.19 (L) 05/12/2022   INR 1.0 06/18/2021   HGBA1C 6.7 (H) 05/12/2022   MICROALBUR <0.7 02/08/2022   Lab Results  Component Value Date   TSH 0.19 (L) 05/12/2022   Lab Results  Component Value Date   WBC 13.2 (H) 05/12/2022   HGB 12.7 05/12/2022   HCT 39.4 05/12/2022   MCV 90.6 05/12/2022   PLT 318.0 05/12/2022   Lab Results  Component Value Date   NA 137 05/12/2022   K 3.5 05/12/2022   CO2 31 05/12/2022   GLUCOSE 94 05/12/2022   BUN 13 05/12/2022   CREATININE 0.85 05/12/2022   BILITOT 0.3 05/12/2022   ALKPHOS 74 05/12/2022   AST 20 05/12/2022   ALT 28 05/12/2022   PROT 6.7 05/12/2022   ALBUMIN 3.9 05/12/2022   CALCIUM 9.6 05/12/2022   ANIONGAP 10 11/04/2021   EGFR 81 06/17/2021   GFR 68.06 05/12/2022   Lab Results  Component Value Date   CHOL 189 05/12/2022   Lab Results  Component Value Date   HDL 55.70 05/12/2022   Lab Results  Component Value Date   LDLCALC 104 (H) 02/08/2022   Lab Results  Component Value Date   TRIG 220.0 (H) 05/12/2022   Lab Results  Component Value Date   CHOLHDL 3 05/12/2022   Lab Results  Component Value Date   HGBA1C 6.7 (H) 05/12/2022      Assessment & Plan:   Problem List Items Addressed This Visit     Hyperlipidemia    Encourage heart healthy diet such as MIND or DASH diet, increase exercise, avoid trans fats, simple  carbohydrates and processed foods, consider a krill or fish or flaxseed oil cap daily. Tolerating Rosuvastatin      Relevant Orders   Lipid panel (Completed)   HTN (hypertension)    Well controlled, no changes to meds. Encouraged heart healthy diet such as the DASH diet and exercise as tolerated.  Relevant Orders   CBC (Completed)   Comprehensive metabolic panel (Completed)   TSH (Completed)   GERD (gastroesophageal reflux disease)    Avoid offending foods, start probiotics. Do not eat large meals in late evening and consider raising head of bed. Continue Omeprazole and add Famotidine 40 mg po qhs      Relevant Medications   famotidine (PEPCID) 40 MG tablet   omeprazole (PRILOSEC) 40 MG capsule   Hypothyroidism    Supplement and monitor      Controlled type 2 diabetes mellitus without complication, without long-term current use of insulin (HCC)    hgba1c acceptable, minimize simple carbs. Increase exercise as tolerated. Continue current meds      Relevant Orders   Hemoglobin A1c (Completed)   Sinusitis    Encouraged increased rest and hydration, add probiotics, zinc such as Coldeze or Xicam. Treat fevers as needed. Doxycycline and diflucan are prescribed.       Relevant Medications   doxycycline (VIBRA-TABS) 100 MG tablet   fluconazole (DIFLUCAN) 150 MG tablet   Meds ordered this encounter  Medications   famotidine (PEPCID) 40 MG tablet    Sig: Take 1 tablet (40 mg total) by mouth at bedtime.    Dispense:  40 tablet    Refill:  3   omeprazole (PRILOSEC) 40 MG capsule    Sig: Take 1 capsule (40 mg total) by mouth daily.    Dispense:  30 capsule    Refill:  2   nystatin cream (MYCOSTATIN)    Sig: Apply 1 Application topically 2 (two) times daily.    Dispense:  30 g    Refill:  0   doxycycline (VIBRA-TABS) 100 MG tablet    Sig: Take 1 tablet (100 mg total) by mouth 2 (two) times daily.    Dispense:  20 tablet    Refill:  0   fluconazole (DIFLUCAN) 150 MG  tablet    Sig: 1 tab po weekly x 3 weeks    Dispense:  3 tablet    Refill:  0   I, [name], MD, personally preformed the services described in this documentation.  All medical record entries made by the scribe were at my direction and in my presence.  I have reviewed the chart and discharge instructions (if applicable) and agree that the record reflects my personal performance and is accurate and complete. 05/12/2022  I,Mohammed Iqbal,acting as a scribe for [name], MD.,have documented all relevant documentation on the behalf of [name], MD,as directed by  [name], MD while in the presence of [name], MD.  [name], MD

## 2022-05-14 NOTE — Assessment & Plan Note (Signed)
Avoid offending foods, start probiotics. Do not eat large meals in late evening and consider raising head of bed. Continue Omeprazole and add Famotidine 40 mg po qhs

## 2022-05-14 NOTE — Assessment & Plan Note (Signed)
Encouraged increased rest and hydration, add probiotics, zinc such as Coldeze or Xicam. Treat fevers as needed. Doxycycline and diflucan are prescribed.

## 2022-05-18 ENCOUNTER — Encounter (HOSPITAL_BASED_OUTPATIENT_CLINIC_OR_DEPARTMENT_OTHER): Payer: Self-pay | Admitting: Emergency Medicine

## 2022-05-18 ENCOUNTER — Emergency Department (HOSPITAL_BASED_OUTPATIENT_CLINIC_OR_DEPARTMENT_OTHER)
Admission: EM | Admit: 2022-05-18 | Discharge: 2022-05-18 | Disposition: A | Discharge status: Home / Self Care | Payer: Medicare Other | Attending: Emergency Medicine | Admitting: Emergency Medicine

## 2022-05-18 ENCOUNTER — Other Ambulatory Visit: Payer: Self-pay

## 2022-05-18 ENCOUNTER — Encounter: Payer: Self-pay | Admitting: Family Medicine

## 2022-05-18 ENCOUNTER — Emergency Department (HOSPITAL_BASED_OUTPATIENT_CLINIC_OR_DEPARTMENT_OTHER): Payer: Medicare Other

## 2022-05-18 DIAGNOSIS — Z7951 Long term (current) use of inhaled steroids: Secondary | ICD-10-CM | POA: Insufficient documentation

## 2022-05-18 DIAGNOSIS — J45909 Unspecified asthma, uncomplicated: Secondary | ICD-10-CM | POA: Diagnosis not present

## 2022-05-18 DIAGNOSIS — R0602 Shortness of breath: Secondary | ICD-10-CM | POA: Insufficient documentation

## 2022-05-18 DIAGNOSIS — J069 Acute upper respiratory infection, unspecified: Secondary | ICD-10-CM | POA: Diagnosis not present

## 2022-05-18 DIAGNOSIS — B9789 Other viral agents as the cause of diseases classified elsewhere: Secondary | ICD-10-CM | POA: Diagnosis not present

## 2022-05-18 DIAGNOSIS — Z20822 Contact with and (suspected) exposure to covid-19: Secondary | ICD-10-CM | POA: Diagnosis not present

## 2022-05-18 DIAGNOSIS — R062 Wheezing: Secondary | ICD-10-CM | POA: Diagnosis not present

## 2022-05-18 DIAGNOSIS — R059 Cough, unspecified: Secondary | ICD-10-CM | POA: Diagnosis not present

## 2022-05-18 DIAGNOSIS — D72829 Elevated white blood cell count, unspecified: Secondary | ICD-10-CM | POA: Diagnosis not present

## 2022-05-18 DIAGNOSIS — E876 Hypokalemia: Secondary | ICD-10-CM | POA: Diagnosis not present

## 2022-05-18 LAB — COMPREHENSIVE METABOLIC PANEL
ALT: 23 U/L (ref 0–44)
AST: 29 U/L (ref 15–41)
Albumin: 3.5 g/dL (ref 3.5–5.0)
Alkaline Phosphatase: 65 U/L (ref 38–126)
Anion gap: 8 (ref 5–15)
BUN: 14 mg/dL (ref 8–23)
CO2: 30 mmol/L (ref 22–32)
Calcium: 9.3 mg/dL (ref 8.9–10.3)
Chloride: 98 mmol/L (ref 98–111)
Creatinine, Ser: 0.87 mg/dL (ref 0.44–1.00)
GFR, Estimated: >60 mL/min (ref >60)
Glucose, Bld: 142 mg/dL — ABNORMAL HIGH (ref 70–99)
Potassium: 2.9 mmol/L — ABNORMAL LOW (ref 3.5–5.1)
Sodium: 136 mmol/L (ref 135–145)
Total Bilirubin: 0.5 mg/dL (ref 0.3–1.2)
Total Protein: 7.8 g/dL (ref 6.5–8.1)

## 2022-05-18 LAB — CBC WITH DIFFERENTIAL/PLATELET
Abs Immature Granulocytes: 0.04 10*3/uL (ref 0.00–0.07)
Basophils Absolute: 0.1 10*3/uL (ref 0.0–0.1)
Basophils Relative: 1 %
Eosinophils Absolute: 0.3 10*3/uL (ref 0.0–0.5)
Eosinophils Relative: 2 %
HCT: 39.9 % (ref 36.0–46.0)
Hemoglobin: 13.2 g/dL (ref 12.0–15.0)
Immature Granulocytes: 0 %
Lymphocytes Relative: 42 %
Lymphs Abs: 5.3 10*3/uL — ABNORMAL HIGH (ref 0.7–4.0)
MCH: 29.7 pg (ref 26.0–34.0)
MCHC: 33.1 g/dL (ref 30.0–36.0)
MCV: 89.9 fL (ref 80.0–100.0)
Monocytes Absolute: 1.2 10*3/uL — ABNORMAL HIGH (ref 0.1–1.0)
Monocytes Relative: 9 %
Neutro Abs: 5.9 10*3/uL (ref 1.7–7.7)
Neutrophils Relative %: 46 %
Platelets: 300 10*3/uL (ref 150–400)
RBC: 4.44 MIL/uL (ref 3.87–5.11)
RDW: 13.4 % (ref 11.5–15.5)
WBC: 12.7 10*3/uL — ABNORMAL HIGH (ref 4.0–10.5)
nRBC: 0 % (ref 0.0–0.2)

## 2022-05-18 LAB — TROPONIN I (HIGH SENSITIVITY): Troponin I (High Sensitivity): 2 ng/L (ref <18)

## 2022-05-18 LAB — BRAIN NATRIURETIC PEPTIDE: B Natriuretic Peptide: 21.1 pg/mL (ref 0.0–100.0)

## 2022-05-18 LAB — RESP PANEL BY RT-PCR (FLU A&B, COVID) ARPGX2
Influenza A by PCR: NEGATIVE
Influenza B by PCR: NEGATIVE
SARS Coronavirus 2 by RT PCR: NEGATIVE

## 2022-05-18 MED ORDER — ALBUTEROL SULFATE (2.5 MG/3ML) 0.083% IN NEBU
2.5000 mg | INHALATION_SOLUTION | Freq: Once | RESPIRATORY_TRACT | Status: AC
Start: 2022-05-18 — End: 2022-05-18
  Administered 2022-05-18: 2.5 mg via RESPIRATORY_TRACT
  Filled 2022-05-18: qty 3

## 2022-05-18 MED ORDER — IPRATROPIUM-ALBUTEROL 0.5-2.5 (3) MG/3ML IN SOLN
3.0000 mL | Freq: Once | RESPIRATORY_TRACT | Status: AC
Start: 2022-05-18 — End: 2022-05-18
  Administered 2022-05-18: 3 mL via RESPIRATORY_TRACT
  Filled 2022-05-18: qty 3

## 2022-05-18 MED ORDER — BENZONATATE 100 MG PO CAPS: 200.0000 mg | ORAL_CAPSULE | Freq: Three times a day (TID) | ORAL | 0 refills | Status: DC | PRN

## 2022-05-18 MED ORDER — POTASSIUM CHLORIDE CRYS ER 20 MEQ PO TBCR
40.0000 meq | EXTENDED_RELEASE_TABLET | Freq: Once | ORAL | Status: AC
  Administered 2022-05-18: 40 meq via ORAL
  Filled 2022-05-18: qty 2

## 2022-05-18 MED ORDER — PREDNISONE 20 MG PO TABS: ORAL_TABLET | ORAL | 0 refills | Status: DC

## 2022-05-18 NOTE — ED Triage Notes (Signed)
Congested and short of breath x 2 days , Hx asthma , persistent cough x 3 weeks , was treated for steroids .

## 2022-05-18 NOTE — Telephone Encounter (Signed)
Called pt and pt stated she was having shortness of breath  And chest pain. Still taking antibiotic but don't seem to be helping.  Pt was advised to go ER to be seen.

## 2022-05-18 NOTE — Discharge Instructions (Signed)
1.  Complete your doxycycline prescription.  Take 4 more days of prednisone as prescribed.  Continue to use your inhalers.  Continue the reflux medications as prescribed by your doctor.  Take [employer] as needed. 2.  A large respiratory panel for many viruses has been done in the emergency department.  Your results should be available in 1 to 2 days on your MyChart.  Review this with your doctor. 3.  Return to emergency department immediately if you are getting chest pain, increased shortness of breath or other concerning changes. 4.  If your symptoms are persisting despite multiple treatment courses, you need to follow-up with your pulmonologist as soon as possible.

## 2022-05-18 NOTE — ED Notes (Signed)
ED Provider at bedside. 

## 2022-05-18 NOTE — ED Provider Notes (Signed)
[facility] Provider Note   CSN: [id number] Arrival date & time: 05/18/22  1654     History  Chief Complaint  Patient presents with   URI    Monica Wallace is a 71 y.o. female.  HPI Patient reports he is continue to have a lot of coughing and congestion despite multiple treatments for bronchitis.  Patient reports typically she might get an episode of bronchitis and it resolves after a few weeks.  She does have history of asthma and is seen both by allergist and pulmonology.  Patient reports she has had several rounds of treatment for symptoms that have now persisted for 3 weeks.  Continues to have harsh cough and some shortness of breath.  He has not had episodes of fevers, no vomiting, no abdominal pain, no chest pain.  Cough is sometimes productive of a clear sputum.  Patient is currently taking a course of doxycycline.  She is about halfway through.  She has had 2 prior courses of prednisone, last course was approximately 9 days ago completed.  She reports she is temporarily doing better with the prednisone but then symptoms are rebounding.  She does have a nebulizer at home and is using her albuterol inhaler about every 4-6 hours.  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  She does go to his school environment with him for caregiving.    Home Medications Prior to Admission medications   Medication Sig Start Date End Date Taking? Authorizing Provider  benzonatate (TESSALON PERLES) 100 MG capsule Take 2 capsules (200 mg total) by mouth 3 (three) times daily as needed for cough. 05/18/22  Yes Charlesetta Shanks, MD  predniSONE (DELTASONE) 20 MG tablet 2 tabs po daily x 4 days 05/18/22  Yes Kaegan Stigler, Jeannie Done, MD  acetaminophen (TYLENOL) 325 MG tablet Take 650 mg by mouth every 6 (six) hours as needed.    [provider]  albuterol (PROVENTIL) (2.5 MG/3ML) 0.083% nebulizer solution Take 3 mLs  (2.5 mg total) by nebulization every 6 (six) hours as needed for wheezing or shortness of breath. 05/05/22   Clemon Chambers, MD  albuterol (VENTOLIN HFA) 108 (90 Base) MCG/ACT inhaler Inhale 2 puffs into the lungs every 6 (six) hours as needed for wheezing or shortness of breath. 05/05/22   Clemon Chambers, MD  azelastine (ASTELIN) 0.1 % nasal spray 1-2 sprays per nostril twice daily for runny nose 04/22/22   Clemon Chambers, MD  Blood Glucose Monitoring Suppl ([place]) w/Device KIT Use to check blood glucose once a day.  DX code: E11.9 02/06/20   [name], MD  budesonide (PULMICORT FLEXHALER) 180 MCG/ACT inhaler 2 puffs twice daily to prevent coughing or wheezing. 04/22/22   Clemon Chambers, MD  Cholecalciferol ([employer] VIT D 5000 HIGH-POTENCY PO) Take 5,000 Units by mouth daily.    [provider]  doxycycline (VIBRA-TABS) 100 MG tablet Take 1 tablet (100 mg total) by mouth 2 (two) times daily. 05/12/22   [name], MD  estradiol (ESTRACE VAGINAL) 0.1 MG/GM vaginal cream Place 1 Applicatorful vaginally at bedtime. 04/28/21   Lavonia Drafts, MD  famotidine (PEPCID) 40 MG tablet Take 1 tablet (40 mg total) by mouth at bedtime. 05/12/22   [name], MD  fluconazole (DIFLUCAN) 150 MG tablet 1 tab po weekly x 3 weeks 05/12/22   [name], MD  fluticasone [facility]) 50 MCG/ACT  nasal spray Place 2 sprays into both nostrils daily. 04/22/22   Clemon Chambers, MD  furosemide (LASIX) 20 MG tablet 1 tab by mouth daily as needed for edema, weight gain >3lbs in 24 hours 01/06/22   [name], MD  glucose blood (ONETOUCH VERIO) test strip TEST BLOOD GLUCOSE ONCE A DAY 11/05/21   [name], MD  hyoscyamine (LEVSIN SL) 0.125 MG SL tablet Place 1 tablet (0.125 mg total) under the tongue every 4 (four) hours as needed. 03/02/21   [name], MD  ipratropium (ATROVENT) 0.03 % nasal spray Place 2 sprays into both nostrils 2 (two) times daily as needed for  rhinitis. Use less frequently if you become too dry. 04/22/22   Clemon Chambers, MD  Lancets [facility] DELICA PLUS [id number]) [place] TEST BLOOD SUGAR ONCE A DAY 11/05/21   [name], MD  levocetirizine (XYZAL) 5 MG tablet Take 1 tablet (5 mg total) by mouth every evening. 12/21/21   Althea Charon, FNP  montelukast (SINGULAIR) 10 MG tablet Take 1 tablet (10 mg total) by mouth at bedtime. 03/17/22   [name], MD  nystatin cream (MYCOSTATIN) Apply 1 Application topically 2 (two) times daily. 05/12/22   [name], MD  Olopatadine HCl 0.2 % SOLN Place 1 drop in each eye once a day as needed for itchy watery eyes 12/21/21   Althea Charon, FNP  Omega-3 1000 MG CAPS Take 1,000 mg by mouth daily.    [provider]  omeprazole (PRILOSEC) 40 MG capsule Take 1 capsule (40 mg total) by mouth daily. 05/12/22   [name], MD  phenazopyridine (PYRIDIUM) 200 MG tablet Take 1 tablet (200 mg total) by mouth 3 (three) times daily as needed for pain. 04/07/22   Bonnita Hollow, MD  potassium chloride SA (KLOR-CON M) 20 MEQ tablet TAKE 2 TABLETS BY MOUTH DAILY FOR 3 DAYS. THEN TAKE 1 TABLET BY MOUTH DAILY. 11/10/21   [name], MD  rosuvastatin (CRESTOR) 5 MG tablet TAKE 1 TAKE ONE TABLET EVERY__ HOURS TAKE ONE TABLET EVERY__ HOURS TAB BY MOUTH 2 TIMES A WEEK(TUE AND SUN WEEKLY) 08/23/21   [name], MD  SYNTHROID 137 MCG tablet Take 1 tablet (137 mcg total) by mouth daily before breakfast. 02/28/22   Shamleffer, Melanie Crazier, MD  triamterene-hydrochlorothiazide (TIWPYKD-98) 37.5-25 MG tablet TAKE 1 TABLET BY MOUTH DAILY 02/07/22   [name], MD      Allergies    Levaquin [levofloxacin], Penicillins, Clindamycin/lincomycin, Codeine, and Gabapentin    Review of Systems   Review of Systems  Physical Exam Updated Vital Signs BP 121/63   Pulse 79   Temp 98 F (36.7 C) (Oral)   Resp 17   Ht _0  (1.575 m)   Wt 90.3 kg   SpO2 97%   BMI 36.40 kg/m  Physical  Exam Constitutional:      Comments: Patient is alert and nontoxic.  No respiratory distress at rest.  HENT:     Mouth/Throat:     Pharynx: Oropharynx is clear.  Eyes:     Extraocular Movements: Extraocular movements intact.  Cardiovascular:     Rate and Rhythm: Normal rate and regular rhythm.  Pulmonary:     Comments: Intermittent dry paroxysmal cough.  Scattered expiratory wheeze.  Adequate airflow to the bases. Abdominal:     General: There is no distension.     Palpations: Abdomen is soft.     Tenderness: There is no  abdominal tenderness. There is no guarding.  Musculoskeletal:        General: No swelling or tenderness. Normal range of motion.     Cervical back: Neck supple.     Right lower leg: No edema.     Left lower leg: No edema.  Skin:    General: Skin is warm and dry.  Neurological:     General: No focal deficit present.     Mental Status: She is oriented to person, place, and time.     Motor: No weakness.     Coordination: Coordination normal.  Psychiatric:        Mood and Affect: Mood normal.     ED Results / Procedures / Treatments   Labs (all labs ordered are listed, but only abnormal results are displayed) Labs Reviewed  COMPREHENSIVE METABOLIC PANEL - Abnormal; Notable for the following components:      Result Value   Potassium 2.9 (*)    Glucose, Bld 142 (*)    All other components within normal limits  CBC WITH DIFFERENTIAL/PLATELET - Abnormal; Notable for the following components:   WBC 12.7 (*)    Lymphs Abs 5.3 (*)    Monocytes Absolute 1.2 (*)    All other components within normal limits  RESP PANEL BY RT-PCR (FLU A&B, COVID) [id number]  RESPIRATORY PANEL BY PCR  BRAIN NATRIURETIC PEPTIDE  TROPONIN I (HIGH SENSITIVITY)  TROPONIN I (HIGH SENSITIVITY)    EKG None  Radiology DG Chest 2 View  Result Date: 05/18/2022 CLINICAL DATA:  Cough. EXAM: CHEST - 2 VIEW COMPARISON:  November 04, 2021. FINDINGS: The heart size and mediastinal contours are  within normal limits. Both lungs are clear. The visualized skeletal structures are unremarkable. IMPRESSION: No active cardiopulmonary disease. Electronically Signed   By: Zetta Bills M.D.   On: 05/18/2022 18:42    Procedures Procedures    Medications Ordered in ED Medications  ipratropium-albuterol (DUONEB) 0.5-2.5 (3) MG/3ML nebulizer solution 3 mL (3 mLs Nebulization Given 05/18/22 1712)  albuterol (PROVENTIL) (2.5 MG/3ML) 0.083% nebulizer solution 2.5 mg (2.5 mg Nebulization Given 05/18/22 1712)  potassium chloride SA (KLOR-CON M) CR tablet 40 mEq (40 mEq Oral Given 05/18/22 1922)    ED Course/ Medical Decision Making/ A&P                           Medical Decision Making Amount and/or Complexity of Data Reviewed Labs: ordered. Radiology: ordered.  Risk Prescription drug management.   Patient had persistent problems with cough and wheeze for 3 weeks.  She does have prior history of allergies with asthma.  She has had 2 courses of prednisone with some improvement while she is taking the prednisone but rebounding when she finishes.  Patient does have exposure to school-aged children and also her patient client who has tracheostomy for possible infectious source of bronchitis versus bacterial pneumonia.  She has tested negative for Influenzinum COVID.  I will add a respiratory pathogen panel.  2 view chest x-ray reviewed and interpreted by radiology as well as examined by myself, no acute consolidation or infiltrate.  Patient has mild leukocytosis at 12.7. She has taken 2 courses of prednisone which may account for mild leukocytosis, but without fever, tachycardia or hypoxia I have low suspicion for significant bacterial pneumonia.  Patient is all so it midway through a course of doxycycline.  I will have her complete the doxycycline as per her primary provider's recommendations.  Given the  patient is still having significant wheeze and bronchospastic cough, will provide 4 more days  of prednisone until patient has completed her doxycycline and she will continue albuterol inhalers and nebulizer therapy.  I will refill patient's Tessalon Perles.  She reports she does get improvement with Tessalon Perle.  Patient's PCP has prescribed for omeprazole and Pepcid to try to maximally treat reflux driven wheeze and cough.  Patient had mild hypokalemia at 2.9.  Patient reports that she recently has not been taking her 20 mEq daily potassium as prescribed.  Patient was given 40 mEq potassium in the ED and counseled to resume her potassium prescription.  Patient is discharged in stable condition.  Vital signs are stable.  No respiratory distress.  Plan in place for continued treatment of symptoms.  Patient has good follow-up with allergist, pulmonology and PCP managing bronchitis and asthma.          Final Clinical Impression(s) / ED Diagnoses Final diagnoses:  Viral upper respiratory tract infection  Wheeze    Rx / DC Orders ED Discharge Orders          Ordered    predniSONE (DELTASONE) 20 MG tablet        05/18/22 2011    benzonatate (TESSALON PERLES) 100 MG capsule  3 times daily PRN        05/18/22 2011              Charlesetta Shanks, MD 05/18/22 2027

## 2022-05-19 LAB — RESPIRATORY PANEL BY PCR

## 2022-06-14 ENCOUNTER — Other Ambulatory Visit: Payer: Self-pay | Admitting: Family Medicine

## 2022-06-14 ENCOUNTER — Other Ambulatory Visit: Payer: Self-pay | Admitting: Family

## 2022-07-02 DIAGNOSIS — Z23 Encounter for immunization: Secondary | ICD-10-CM | POA: Diagnosis not present

## 2022-07-22 ENCOUNTER — Encounter: Payer: Self-pay | Admitting: Family Medicine

## 2022-08-09 DIAGNOSIS — J320 Chronic maxillary sinusitis: Secondary | ICD-10-CM | POA: Diagnosis not present

## 2022-08-09 DIAGNOSIS — J343 Hypertrophy of nasal turbinates: Secondary | ICD-10-CM | POA: Diagnosis not present

## 2022-08-09 DIAGNOSIS — J321 Chronic frontal sinusitis: Secondary | ICD-10-CM | POA: Diagnosis not present

## 2022-08-16 ENCOUNTER — Ambulatory Visit (INDEPENDENT_AMBULATORY_CARE_PROVIDER_SITE_OTHER): Payer: Medicare Other | Admitting: Family Medicine

## 2022-08-16 VITALS — BP 126/82 | HR 82 | Temp 98.0°F | Resp 16 | Ht 62.0 in | Wt 206.0 lb

## 2022-08-16 DIAGNOSIS — K219 Gastro-esophageal reflux disease without esophagitis: Secondary | ICD-10-CM

## 2022-08-16 DIAGNOSIS — I10 Essential (primary) hypertension: Secondary | ICD-10-CM

## 2022-08-16 DIAGNOSIS — M5416 Radiculopathy, lumbar region: Secondary | ICD-10-CM

## 2022-08-16 DIAGNOSIS — M7121 Synovial cyst of popliteal space [Baker], right knee: Secondary | ICD-10-CM | POA: Diagnosis not present

## 2022-08-16 DIAGNOSIS — E7800 Pure hypercholesterolemia, unspecified: Secondary | ICD-10-CM | POA: Diagnosis not present

## 2022-08-16 DIAGNOSIS — J3089 Other allergic rhinitis: Secondary | ICD-10-CM | POA: Diagnosis not present

## 2022-08-16 DIAGNOSIS — E119 Type 2 diabetes mellitus without complications: Secondary | ICD-10-CM | POA: Diagnosis not present

## 2022-08-16 DIAGNOSIS — E890 Postprocedural hypothyroidism: Secondary | ICD-10-CM

## 2022-08-16 MED ORDER — OMEPRAZOLE 40 MG PO CPDR: 40.0000 mg | DELAYED_RELEASE_CAPSULE | Freq: Every day | ORAL | 2 refills | Status: DC | PRN

## 2022-08-16 MED ORDER — HYOSCYAMINE SULFATE 0.125 MG SL SUBL: 0.1250 mg | SUBLINGUAL_TABLET | SUBLINGUAL | 1 refills | Status: DC | PRN

## 2022-08-16 MED ORDER — TRAMADOL HCL 50 MG PO TABS: 50.0000 mg | ORAL_TABLET | Freq: Three times a day (TID) | ORAL | 0 refills | Status: AC | PRN

## 2022-08-16 NOTE — Progress Notes (Signed)
Subjective:   By signing my name below, I, [name], attest that this documentation has been prepared under the direction and in the presence of [name], MD., 08/16/2022.    Patient ID: Monica Wallace, female    DOB: Aug 21, 1950, 72 y.o.   MRN: [id number]  Chief Complaint  Patient presents with   Follow-up    Follow Up   HPI Patient is in today for an office visit. She denies CP/palpitations/SOB/HA/congestion/ fever/chills/GI or GU symptoms.  Allergies She continues to follow with her allergist Dr. Simona Huh to manage her allergy associated symptoms, such as congestion and sore throat.  Fatigue She complains of fatigue and slight dizziness across the past several weeks. She states that her dizziness resolves when she does not take Omeprazole 40 mg and she is now taking Omeprazole 40 mg prn and Famotidine 40 mg nightly to manage her GERD.  Lower Back Pain Patient reports having sciatic pain that has worsened across the past year. An MRI of the lumbar spine from 09/18/2021 revealed moderate lower lumbar facet arthrosis, which may serve as a source of local low back pain. She currently takes Acetaminophen and Ibuprofen together prn to manage the pain.  Right Knee Pain Patient complains of limited movement and pain in her right knee. She has received steroid injections in the past which have provided temporary relief, but she still experiences pain in the lateral aspect. An MRI of the right knee was performed on 02/21/2022 which revealed attenuation of the posterior horn of the medial meniscus consistent with prior partial meniscectomy and severe attenuation of the anterior horn and body of the lateral meniscus consistent with prior subtotal meniscectomy. She states that the right knee is weak and occasionally buckles when she walks.  Past Medical History:  Diagnosis Date   Arthritis    Asthma    Baker's cyst of knee, right 02/16/2017   Cancer ([place]) 2002   thyroid (2002)    Cystic breast    Diabetes ([place])    GERD (gastroesophageal reflux disease)    Hepatic cyst 02/02/2015   Hyperlipidemia    Hypertension    Insomnia 07/23/2016   Menopause    Migraine    Palpitations    Preventative health care 01/18/2016   Sleep apnea 02/02/2015   Thyroid disease     Past Surgical History:  Procedure Laterality Date   ABDOMINAL HYSTERECTOMY     took right ovary and uterus   APPENDECTOMY     CARPAL TUNNEL RELEASE Right    CESAREAN SECTION     CHOLECYSTECTOMY     EYE SURGERY Bilateral 2017   cataracts   KNEE ARTHROPLASTY     SHOULDER ARTHROSCOPY W/ ROTATOR CUFF REPAIR Left    THYROIDECTOMY  2002    Family History  Problem Relation Age of Onset   Arthritis Mother    Transient ischemic attack Mother    Hypertension Mother    Hyperlipidemia Mother    Diabetes Father    Heart disease Father    Arthritis Father    Kidney disease Father    Hypertension Father    Hyperlipidemia Father    Cancer Sister 24       pancreatic   Fibromyalgia Sister    Allergic rhinitis Sister    Asthma Maternal Grandfather        smoker   Depression Maternal Grandfather    Dementia Paternal Grandmother    Diabetes Paternal Grandfather    Hypertension Brother    Allergic  rhinitis Brother    Scoliosis Daughter        had rods placed and removed   Arthritis Sister    Gout Sister    GER disease Sister    Cancer Sister        breast cancer   Breast cancer Sister 61    Social History   Socioeconomic History   Marital status: Divorced    Spouse name: Not on file   Number of children: 2   Years of education: Not on file   Highest education level: Not on file  Occupational History   Not on file  Tobacco Use   Smoking status: Never   Smokeless tobacco: Never  Vaping Use   Vaping Use: Never used  Substance and Sexual Activity   Alcohol use: Yes    Alcohol/week: 0.0 standard drinks of alcohol    Comment: very rare once or twice a year   Drug use: No   Sexual  activity: Yes    Birth control/protection: Other-see comments, Surgical    Comment:  boyfriend  Other Topics Concern   Not on file  Social History Narrative   Household: pt, daughter and sister    Social Determinants of Health   Financial Resource Strain: Medium Risk (11/23/2021)   Overall Financial Resource Strain (CARDIA)    Difficulty of Paying Living Expenses: Somewhat hard  Food Insecurity: No Food Insecurity (04/04/2022)   Hunger Vital Sign    Worried About Running Out of Food in the Last Year: Never true    Ran Out of Food in the Last Year: Never true  Transportation Needs: No Transportation Needs (04/04/2022)   PRAPARE - Hydrologist (Medical): No    Lack of Transportation (Non-Medical): No  Physical Activity: Inactive (02/08/2021)   Exercise Vital Sign    Days of Exercise per Week: 0 days    Minutes of Exercise per Session: 0 min  Stress: No Stress Concern Present (04/04/2022)   [place]    Feeling of Stress : Not at all  Social Connections: Moderately Isolated (02/08/2021)   Social Connection and Isolation Panel [NHANES]    Frequency of Communication with Friends and Family: More than three times a week    Frequency of Social Gatherings with Friends and Family: More than three times a week    Attends Religious Services: More than 4 times per year    Active Member of [employer] or Organizations: No    Attends Archivist Meetings: Never    Marital Status: Divorced  Human resources officer Violence: Not At Risk (04/04/2022)   Humiliation, Afraid, Rape, and Kick questionnaire    Fear of Current or Ex-Partner: No    Emotionally Abused: No    Physically Abused: No    Sexually Abused: No    Outpatient Medications Prior to Visit  Medication Sig Dispense Refill   acetaminophen (TYLENOL) 325 MG tablet Take 650 mg by mouth every 6 (six) hours as needed.     albuterol (PROVENTIL) (2.5  MG/3ML) 0.083% nebulizer solution Take 3 mLs (2.5 mg total) by nebulization every 6 (six) hours as needed for wheezing or shortness of breath. 75 mL 0   albuterol (VENTOLIN HFA) 108 (90 Base) MCG/ACT inhaler Inhale 2 puffs into the lungs every 6 (six) hours as needed for wheezing or shortness of breath. 18 g 1   azelastine (ASTELIN) 0.1 % nasal spray 1-2 sprays per nostril twice daily for  runny nose 30 mL 5   benzonatate (TESSALON PERLES) 100 MG capsule Take 2 capsules (200 mg total) by mouth 3 (three) times daily as needed for cough. 30 capsule 0   Blood Glucose Monitoring Suppl ([place]) w/Device KIT Use to check blood glucose once a day.  DX code: E11.9 1 kit 0   budesonide (PULMICORT FLEXHALER) 180 MCG/ACT inhaler 2 puffs twice daily to prevent coughing or wheezing. 1 each 5   Cholecalciferol ([employer] VIT D 5000 HIGH-POTENCY PO) Take 5,000 Units by mouth daily.     estradiol (ESTRACE VAGINAL) 0.1 MG/GM vaginal cream Place 1 Applicatorful vaginally at bedtime. 42.5 g 12   famotidine (PEPCID) 40 MG tablet Take 1 tablet (40 mg total) by mouth at bedtime. 40 tablet 3   fluticasone (FLONASE) 50 MCG/ACT nasal spray Place 2 sprays into both nostrils daily. 16 g 5   furosemide (LASIX) 20 MG tablet 1 tab by mouth daily as needed for edema, weight gain >3lbs in 24 hours 30 tablet 3   glucose blood (ONETOUCH VERIO) test strip TEST BLOOD GLUCOSE ONCE A DAY 100 strip 1   ipratropium (ATROVENT) 0.03 % nasal spray Place 2 sprays into both nostrils 2 (two) times daily as needed for rhinitis. Use less frequently if you become too dry. 30 mL 12   Lancets (ONETOUCH DELICA PLUS ATFTDD22G) MISC TEST BLOOD SUGAR ONCE A DAY 100 each 1   levocetirizine (XYZAL) 5 MG tablet TAKE 1 TABLET(5 MG) BY MOUTH EVERY EVENING 30 tablet 5   montelukast (SINGULAIR) 10 MG tablet Take 1 tablet (10 mg total) by mouth at bedtime. 90 tablet 1   nystatin cream (MYCOSTATIN) Apply 1 Application topically 2 (two) times daily.  30 g 0   Olopatadine HCl 0.2 % SOLN Place 1 drop in each eye once a day as needed for itchy watery eyes 2.5 mL 5   Omega-3 1000 MG CAPS Take 1,000 mg by mouth daily.     potassium chloride SA (KLOR-CON M) 20 MEQ tablet TAKE 2 TABLETS BY MOUTH DAILY FOR 3 DAYS. THEN TAKE 1 TABLET BY MOUTH DAILY. 30 tablet 3   rosuvastatin (CRESTOR) 5 MG tablet TAKE 1 TABLET TWICE A WEEK 25 tablet 1   SYNTHROID 137 MCG tablet Take 1 tablet (137 mcg total) by mouth daily before breakfast. 90 tablet 3   triamterene-hydrochlorothiazide (MAXZIDE-25) 37.5-25 MG tablet TAKE 1 TABLET BY MOUTH DAILY 90 tablet 1   hyoscyamine (LEVSIN SL) 0.125 MG SL tablet Place 1 tablet (0.125 mg total) under the tongue every 4 (four) hours as needed. 30 tablet 1   omeprazole (PRILOSEC) 40 MG capsule Take 1 capsule (40 mg total) by mouth daily. 30 capsule 2   doxycycline (VIBRA-TABS) 100 MG tablet Take 1 tablet (100 mg total) by mouth 2 (two) times daily. 20 tablet 0   fluconazole (DIFLUCAN) 150 MG tablet 1 tab po weekly x 3 weeks 3 tablet 0   phenazopyridine (PYRIDIUM) 200 MG tablet Take 1 tablet (200 mg total) by mouth 3 (three) times daily as needed for pain. 10 tablet 0   predniSONE (DELTASONE) 20 MG tablet 2 tabs po daily x 4 days 8 tablet 0   No facility-administered medications prior to visit.    Allergies  Allergen Reactions   Levaquin [Levofloxacin] Shortness Of Breath   Penicillins Other (See Comments)    Positive on allergy testing 'years ago.' Has never had penicillin. Has tolerated amoxicillin.   Clindamycin/Lincomycin Other (See Comments)  Pruritus No rash   Codeine Itching   Gabapentin Itching    Review of Systems  Constitutional:  Negative for chills and fever.  HENT:  Negative for congestion.   Respiratory:  Negative for shortness of breath.   Cardiovascular:  Negative for chest pain and palpitations.  Gastrointestinal:  Negative for abdominal pain, blood in stool, constipation, diarrhea, nausea and  vomiting.  Genitourinary:  Negative for dysuria, frequency, hematuria and urgency.  Musculoskeletal:        (+) right knee pain.  Skin:           Neurological:  Negative for headaches.       Objective:    Physical Exam Constitutional:      General: She is not in acute distress.    Appearance: Normal appearance. She is normal weight. She is not ill-appearing.  HENT:     Head: Normocephalic and atraumatic.     Right Ear: External ear normal.     Left Ear: External ear normal.     Nose: Nose normal.     Mouth/Throat:     Mouth: Mucous membranes are moist.     Pharynx: Oropharynx is clear.  Eyes:     General:        Right eye: No discharge.        Left eye: No discharge.     Extraocular Movements: Extraocular movements intact.     Conjunctiva/sclera: Conjunctivae normal.     Pupils: Pupils are equal, round, and reactive to light.  Cardiovascular:     Rate and Rhythm: Normal rate and regular rhythm.     Pulses: Normal pulses.     Heart sounds: Normal heart sounds. No murmur heard.    No gallop.  Pulmonary:     Effort: Pulmonary effort is normal. No respiratory distress.     Breath sounds: Normal breath sounds. No wheezing or rales.  Abdominal:     General: Bowel sounds are normal.     Palpations: Abdomen is soft.     Tenderness: There is no abdominal tenderness. There is no guarding.  Musculoskeletal:        General: Normal range of motion.     Cervical back: Normal range of motion.     Right lower leg: No edema.     Left lower leg: No edema.  Skin:    General: Skin is warm and dry.  Neurological:     Mental Status: She is alert and oriented to person, place, and time.  Psychiatric:        Mood and Affect: Mood normal.        Behavior: Behavior normal.        Judgment: Judgment normal.     BP 126/82 (BP Location: Right Arm, Patient Position: Sitting, Cuff Size: Normal)   Pulse 82   Temp 98 F (36.7 C) (Oral)   Resp 16   Ht '5\' 2"'$  (1.575 m)   Wt 206 lb (93.4  kg)   SpO2 95%   BMI 37.68 kg/m  Wt Readings from Last 3 Encounters:  08/16/22 206 lb (93.4 kg)  05/18/22 199 lb (90.3 kg)  05/12/22 208 lb 6.4 oz (94.5 kg)    Diabetic Foot Exam - Simple   No data filed    Lab Results  Component Value Date   WBC 9.7 08/16/2022   HGB 12.8 08/16/2022   HCT 38.1 08/16/2022   PLT 311.0 08/16/2022   GLUCOSE 119 (H) 08/16/2022   CHOL 214 (H) 08/16/2022  TRIG 262.0 (H) 08/16/2022   HDL 48.00 08/16/2022   LDLDIRECT 124.0 08/16/2022   LDLCALC 104 (H) 02/08/2022   ALT 17 08/16/2022   AST 21 08/16/2022   NA 141 08/16/2022   K 3.7 08/16/2022   CL 100 08/16/2022   CREATININE 1.00 08/16/2022   BUN 16 08/16/2022   CO2 31 08/16/2022   TSH 1.21 08/16/2022   INR 1.0 06/18/2021   HGBA1C 6.4 08/16/2022   MICROALBUR <0.7 02/08/2022    Lab Results  Component Value Date   TSH 1.21 08/16/2022   Lab Results  Component Value Date   WBC 9.7 08/16/2022   HGB 12.8 08/16/2022   HCT 38.1 08/16/2022   MCV 90.9 08/16/2022   PLT 311.0 08/16/2022   Lab Results  Component Value Date   NA 141 08/16/2022   K 3.7 08/16/2022   CO2 31 08/16/2022   GLUCOSE 119 (H) 08/16/2022   BUN 16 08/16/2022   CREATININE 1.00 08/16/2022   BILITOT 0.2 08/16/2022   ALKPHOS 70 08/16/2022   AST 21 08/16/2022   ALT 17 08/16/2022   PROT 6.9 08/16/2022   ALBUMIN 4.2 08/16/2022   CALCIUM 9.7 08/16/2022   ANIONGAP 8 05/18/2022   EGFR 81 06/17/2021   GFR 55.90 (L) 08/16/2022   Lab Results  Component Value Date   CHOL 214 (H) 08/16/2022   Lab Results  Component Value Date   HDL 48.00 08/16/2022   Lab Results  Component Value Date   LDLCALC 104 (H) 02/08/2022   Lab Results  Component Value Date   TRIG 262.0 (H) 08/16/2022   Lab Results  Component Value Date   CHOLHDL 4 08/16/2022   Lab Results  Component Value Date   HGBA1C 6.4 08/16/2022      Assessment & Plan:  Immunizations: Encouraged COVID-19, RSV, and Shingles immunizations.  Labs: Routine  blood work completed today.  Lower Back Pain: Tramadol 50 mg has been prescribed today.  Refills: Hyoscyamine 0.125 mg refilled today. Problem List Items Addressed This Visit     Allergic rhinoconjunctivitis    Following with allergist Dr Elaina Hoops cyst of knee, right    Pain has moved from back of knee to lateral aspect she is encouraged to continue to work with ortho, can use Tramadol prn      Controlled type 2 diabetes mellitus without complication, without long-term current use of insulin ([place])    hgba1c acceptable, minimize simple carbs. Increase exercise as tolerated. Continue current meds      Relevant Orders   HgB A1c (Completed)   GERD (gastroesophageal reflux disease)    Avoid offending foods, start probiotics. Do not eat large meals in late evening and consider raising head of bed.  Given refill on Omeprazole and can try Hyoscyamine for spasm      Relevant Medications   omeprazole (PRILOSEC) 40 MG capsule   hyoscyamine (LEVSIN SL) 0.125 MG SL tablet   HTN (hypertension) - Primary    Well controlled, no changes to meds. Encouraged heart healthy diet such as the DASH diet and exercise as tolerated.        Relevant Orders   CBC with Differential/Platelet (Completed)   Comprehensive metabolic panel (Completed)   T4, free (Completed)   Hyperlipidemia    Encourage heart healthy diet such as MIND or DASH diet, increase exercise, avoid trans fats, simple carbohydrates and processed foods, consider a krill or fish or flaxseed oil cap daily. Tolerating Rosuvastatin  Relevant Orders   Lipid panel (Completed)   Hypothyroidism    Supplement and monitor      Relevant Orders   TSH (Completed)   Thyroglobulin antibody (Completed)   Lumbar radiculopathy    Encouraged moist heat and gentle stretching as tolerated. May try NSAIDs and prescription meds as directed and report if symptoms worsen or seek immediate care       Meds ordered this encounter   Medications   traMADol (ULTRAM) 50 MG tablet    Sig: Take 1-2 tablets (50-100 mg total) by mouth every 8 (eight) hours as needed for up to 5 days.    Dispense:  30 tablet    Refill:  0   omeprazole (PRILOSEC) 40 MG capsule    Sig: Take 1 capsule (40 mg total) by mouth daily as needed (reflux).    Dispense:  30 capsule    Refill:  2   hyoscyamine (LEVSIN SL) 0.125 MG SL tablet    Sig: Place 1 tablet (0.125 mg total) under the tongue every 4 (four) hours as needed.    Dispense:  30 tablet    Refill:  1   I, [name], MD, personally preformed the services described in this documentation.  All medical record entries made by the scribe were at my direction and in my presence.  I have reviewed the chart and discharge instructions (if applicable) and agree that the record reflects my personal performance and is accurate and complete. 08/16/2022  I,Mohammed Iqbal,acting as a scribe for [name], MD.,have documented all relevant documentation on the behalf of [name], MD,as directed by  [name], MD while in the presence of [name], MD.  [name], MD

## 2022-08-16 NOTE — Assessment & Plan Note (Signed)
hgba1c acceptable, minimize simple carbs. Increase exercise as tolerated. Continue current meds 

## 2022-08-16 NOTE — Patient Instructions (Addendum)
Tylenol/Acetaminophen 3000 mg in 24 hours Advil/Motrin/Ibuprophen 200 mg tabs, 1-2 tabs max of 2400 mg in 24 hours RSV, Respiratory Syncitial Virus, Arexvy vaccine at pharmacy  Shingrix is the new shingles shot, 2 shots over 2-6 months, confirm coverage with insurance and document, then can return here for shots with nurse appt or at pharmacy

## 2022-08-17 LAB — THYROGLOBULIN ANTIBODY: Thyroglobulin Ab: <1 IU/mL (ref <=1)

## 2022-08-17 LAB — COMPREHENSIVE METABOLIC PANEL
ALT: 17 U/L (ref 0–35)
AST: 21 U/L (ref 0–37)
Albumin: 4.2 g/dL (ref 3.5–5.2)
Alkaline Phosphatase: 70 U/L (ref 39–117)
BUN: 16 mg/dL (ref 6–23)
CO2: 31 mEq/L (ref 19–32)
Calcium: 9.7 mg/dL (ref 8.4–10.5)
Chloride: 100 mEq/L (ref 96–112)
Creatinine, Ser: 1 mg/dL (ref 0.40–1.20)
GFR: 55.9 mL/min — ABNORMAL LOW (ref >60.00)
Glucose, Bld: 119 mg/dL — ABNORMAL HIGH (ref 70–99)
Potassium: 3.7 mEq/L (ref 3.5–5.1)
Sodium: 141 mEq/L (ref 135–145)
Total Bilirubin: 0.2 mg/dL (ref 0.2–1.2)
Total Protein: 6.9 g/dL (ref 6.0–8.3)

## 2022-08-17 LAB — CBC WITH DIFFERENTIAL/PLATELET
Basophils Absolute: 0.1 10*3/uL (ref 0.0–0.1)
Basophils Relative: 0.8 % (ref 0.0–3.0)
Eosinophils Absolute: 0.2 10*3/uL (ref 0.0–0.7)
Eosinophils Relative: 1.9 % (ref 0.0–5.0)
HCT: 38.1 % (ref 36.0–46.0)
Hemoglobin: 12.8 g/dL (ref 12.0–15.0)
Lymphocytes Relative: 35.7 % (ref 12.0–46.0)
Lymphs Abs: 3.5 10*3/uL (ref 0.7–4.0)
MCHC: 33.5 g/dL (ref 30.0–36.0)
MCV: 90.9 fl (ref 78.0–100.0)
Monocytes Absolute: 0.7 10*3/uL (ref 0.1–1.0)
Monocytes Relative: 7.6 % (ref 3.0–12.0)
Neutro Abs: 5.3 10*3/uL (ref 1.4–7.7)
Neutrophils Relative %: 54 % (ref 43.0–77.0)
Platelets: 311 10*3/uL (ref 150.0–400.0)
RBC: 4.19 Mil/uL (ref 3.87–5.11)
RDW: 13.6 % (ref 11.5–15.5)
WBC: 9.7 10*3/uL (ref 4.0–10.5)

## 2022-08-17 LAB — LIPID PANEL
Cholesterol: 214 mg/dL — ABNORMAL HIGH (ref 0–200)
HDL: 48 mg/dL (ref >39.00)
NonHDL: 166.31
Total CHOL/HDL Ratio: 4
Triglycerides: 262 mg/dL — ABNORMAL HIGH (ref 0.0–149.0)
VLDL: 52.4 mg/dL — ABNORMAL HIGH (ref 0.0–40.0)

## 2022-08-17 LAB — HEMOGLOBIN A1C: Hgb A1c MFr Bld: 6.4 % (ref 4.6–6.5)

## 2022-08-17 LAB — T4, FREE: Free T4: 0.87 ng/dL (ref 0.60–1.60)

## 2022-08-17 LAB — TSH: TSH: 1.21 u[IU]/mL (ref 0.35–5.50)

## 2022-08-17 LAB — LDL CHOLESTEROL, DIRECT: Direct LDL: 124 mg/dL

## 2022-08-18 DIAGNOSIS — M25561 Pain in right knee: Secondary | ICD-10-CM | POA: Diagnosis not present

## 2022-08-19 ENCOUNTER — Other Ambulatory Visit: Payer: Self-pay | Admitting: Family Medicine

## 2022-08-19 DIAGNOSIS — Z1231 Encounter for screening mammogram for malignant neoplasm of breast: Secondary | ICD-10-CM

## 2022-08-21 NOTE — Assessment & Plan Note (Signed)
Encourage heart healthy diet such as MIND or DASH diet, increase exercise, avoid trans fats, simple carbohydrates and processed foods, consider a krill or fish or flaxseed oil cap daily.  Tolerating Rosuvastatin 

## 2022-08-21 NOTE — Assessment & Plan Note (Signed)
Following with allergist Dr Simona Huh

## 2022-08-21 NOTE — Assessment & Plan Note (Signed)
Well controlled, no changes to meds. Encouraged heart healthy diet such as the DASH diet and exercise as tolerated.  

## 2022-08-21 NOTE — Assessment & Plan Note (Signed)
Pain has moved from back of knee to lateral aspect she is encouraged to continue to work with ortho, can use Tramadol prn

## 2022-08-21 NOTE — Assessment & Plan Note (Signed)
Avoid offending foods, start probiotics. Do not eat large meals in late evening and consider raising head of bed.  Given refill on Omeprazole and can try Hyoscyamine for spasm

## 2022-08-21 NOTE — Assessment & Plan Note (Signed)
Encouraged moist heat and gentle stretching as tolerated. May try NSAIDs and prescription meds as directed and report if symptoms worsen or seek immediate care 

## 2022-08-21 NOTE — Assessment & Plan Note (Signed)
Supplement and monitor 

## 2022-08-22 ENCOUNTER — Other Ambulatory Visit: Payer: Self-pay | Admitting: Family Medicine

## 2022-08-27 ENCOUNTER — Encounter: Payer: Self-pay | Admitting: Family Medicine

## 2022-09-12 ENCOUNTER — Ambulatory Visit
Admission: RE | Admit: 2022-09-12 | Discharge: 2022-09-12 | Disposition: A | Discharge status: Home / Self Care | Payer: Medicare Other | Source: Ambulatory Visit | Attending: Family Medicine | Admitting: Family Medicine

## 2022-09-12 DIAGNOSIS — Z1231 Encounter for screening mammogram for malignant neoplasm of breast: Secondary | ICD-10-CM

## 2022-09-12 DIAGNOSIS — Z780 Asymptomatic menopausal state: Secondary | ICD-10-CM

## 2022-09-14 ENCOUNTER — Ambulatory Visit: Payer: Medicare Other | Attending: Orthopedic Surgery | Admitting: Physical Therapy

## 2022-09-14 DIAGNOSIS — M6281 Muscle weakness (generalized): Secondary | ICD-10-CM | POA: Insufficient documentation

## 2022-09-14 DIAGNOSIS — R262 Difficulty in walking, not elsewhere classified: Secondary | ICD-10-CM | POA: Insufficient documentation

## 2022-09-14 DIAGNOSIS — M25561 Pain in right knee: Secondary | ICD-10-CM | POA: Insufficient documentation

## 2022-09-14 DIAGNOSIS — R2681 Unsteadiness on feet: Secondary | ICD-10-CM

## 2022-09-14 NOTE — Therapy (Signed)
OUTPATIENT PHYSICAL THERAPY LOWER EXTREMITY EVALUATION   Patient Name: Monica Wallace MRN: [id number] DOB:09-Dec-1950, 72 y.o., female Today's Date: 09/14/2022  END OF SESSION:  PT End of Session - 09/14/22 1715     Visit Number 1    Number of Visits 12    Date for PT Re-Evaluation 10/26/22    Authorization Type Medicare & AARP    Progress Note Due on Visit 10    PT Start Time 1620    PT Stop Time 1705    PT Time Calculation (min) 45 min    Activity Tolerance Patient tolerated treatment well    Behavior During Therapy WFL for tasks assessed/performed             Past Medical History:  Diagnosis Date   Arthritis    Asthma    Baker's cyst of knee, right 02/16/2017   Cancer ([place]) 2002   thyroid (2002)   Cystic breast    Diabetes ([place])    GERD (gastroesophageal reflux disease)    Hepatic cyst 02/02/2015   Hyperlipidemia    Hypertension    Insomnia 07/23/2016   Menopause    Migraine    Palpitations    Preventative health care 01/18/2016   Sleep apnea 02/02/2015   Thyroid disease    Past Surgical History:  Procedure Laterality Date   ABDOMINAL HYSTERECTOMY     took right ovary and uterus   APPENDECTOMY     CARPAL TUNNEL RELEASE Right    CESAREAN SECTION     CHOLECYSTECTOMY     EYE SURGERY Bilateral 2017   cataracts   KNEE ARTHROPLASTY     SHOULDER ARTHROSCOPY W/ ROTATOR CUFF REPAIR Left    THYROIDECTOMY  2002   Patient Active Problem List   Diagnosis Date Noted   Mild persistent asthma without complication [id number]   Dysfunction of both eustachian tubes 04/22/2022   UTI symptoms 04/07/2022   Hx of papillary thyroid carcinoma 02/28/2022   Postoperative hypothyroidism 02/28/2022   Left ankle pain 02/09/2022   Dizziness 02/08/2022   Edema 02/08/2022   Unsteady gait 11/14/2021   Lumbar radiculopathy 09/09/2021   Cervical cancer screening 03/07/2021   Sinusitis 10/30/2020   Allergic conjunctivitis 04/08/2020   Neck pain 02/06/2020   Right knee pain  09/30/2019   COVID-19 09/30/2019   Educated about COVID-19 virus infection 12/05/2018   Baker's cyst of knee, right 02/16/2017   Insomnia 07/23/2016   Hypokalemia 07/23/2016   Controlled type 2 diabetes mellitus without complication, without long-term current use of insulin ([place]) 01/24/2016   Preventative health care 01/18/2016   Left-sided low back pain with left-sided sciatica 10/07/2015   Obesity (BMI 30-39.9) 06/05/2015   OSA (obstructive sleep apnea) 02/02/2015   Palpitations 09/19/2014   Plantar fasciitis, bilateral 11/19/2013   Pronation deformity of ankle, acquired 11/19/2013   DJD (degenerative joint disease) 08/26/2013   Fibrocystic breast 08/26/2013   HTN (hypertension) 08/26/2013   GERD (gastroesophageal reflux disease) 08/26/2013   S/P hysterectomy with oophorectomy 08/26/2013   Hypothyroidism 08/26/2013   Chronic hoarseness 08/26/2013   Colon polyp 08/26/2013   Hiatal hernia 08/26/2013   Irritable bowel syndrome 08/26/2013   Moderate persistent asthma 08/26/2013   Allergic rhinoconjunctivitis 08/26/2013   Hyperlipidemia 08/22/2013   Migraine 08/22/2013   Thyroid cancer ([place]) 08/22/2013    PCP: [name], MD  REFERRING PROVIDER: Willaim Sheng, MD  REFERRING DIAG: M17.9 (ICD-10-CM) - arthritis of knee  THERAPY DIAG:  Acute pain of right knee  Unsteadiness on  feet  Muscle weakness (generalized)  Difficulty in walking, not elsewhere classified  Rationale for Evaluation and Treatment: Rehabilitation  ONSET DATE: chronic, worsened over last couple of months  SUBJECTIVE:   SUBJECTIVE STATEMENT: Monica Wallace reports that she had 49m of fluid drawn off of her R knee Baker's cyst this summer, but her knee has really been bothering her lately, she was told she has a lot of arthritis in the lateral joint now, but she is not ready to have a TKA yet.  However, she is having trouble sleeping due to pain, the knee buckles, and her leg feels heavy  making it hard to roll over, never had to take so much tylenol as she does now, and trips a lot.  Almost fell yesterday twice stepping down.  So she is very anxious about falling now.  Not sure if able to continue working if this continues.  Can go up stairs better than down, but client lives on 2nd floor.  Knee also feels very stiff, starting to walk with limp because it hurts.  Had a steroid shot which helped but only for a couple of weeks.    PERTINENT HISTORY: asthma, hypertension, thyroid cancer, hypothyrodism, diabetes mellitus type 2, cervical radiculopathy, arthritis, history LBP, R meniscal tears and OA, history of R knee surgery x 2  PAIN:  Are you having pain? Yes: NPRS scale: 2-3/10 Pain location: R lateral knee Pain description: dull ache, but increases to 7/10 at night Aggravating factors: laying down, prolonged sitting, standing and walking, driving prolonged periods, stairs Relieving factors: tylenol, ibuprofen  PRECAUTIONS: Fall  WEIGHT BEARING RESTRICTIONS: No  FALLS:  Has patient fallen in last 6 months?  No but frequently stumbles, has almost fallen numerous times.   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   PLOF: Independent  PATIENT GOALS: get right leg stronger, improve balance, avoid TKA  NEXT MD VISIT: none currently schedule with orthopedist  OBJECTIVE:   DIAGNOSTIC FINDINGS: MR R knee 02/19/2022 IMPRESSION: 1. Attenuation of the posterior horn of the medial meniscus consistent with prior partial meniscectomy. Peripheral meniscal extrusion. No new meniscal tear. 2. Severe attenuation of the anterior horn and body of the lateral meniscus consistent with prior subtotal meniscectomy. Small  oblique tear of the posterior horn of the lateral meniscus extending to the inferior articular surface. 3. Tricompartmental cartilage abnormalities as described above.  PATIENT SURVEYS:  LEFS 33/80 = 41.25%  COGNITION: Overall cognitive status: Within functional limits for tasks assessed     SENSATION: Slightly diminished R upper thigh L2 dermatome  EDEMA:  Circumferential: proximal knee 2 cm, distal knee 1 cm larger on R compared to left.   MUSCLE LENGTH:   POSTURE: rounded shoulders and forward head  PALPATION: Tenderness along R lateral joint line  LOWER EXTREMITY ROM:  Passive ROM Right eval Left eval  Knee flexion 95 120  Knee extension 7 0   (Blank rows = not tested)  LOWER EXTREMITY MMT:  MMT Right eval Left eval  Hip flexion 3+ 4+  Hip extension    Hip abduction 5 5  Hip adduction 5 5  Knee flexion 4+p! 5  Knee extension 5p! 5  Ankle dorsiflexion 5 5  Ankle plantarflexion     (Blank rows =  not tested)  LOWER EXTREMITY SPECIAL TESTS:  NT  FUNCTIONAL TESTS:  5 times sit to stand: 28 seconds with intermittant UE assist, noted R knee buckling 1x.   GAIT: Distance walked: 46' Assistive device utilized: None Level of assistance: Complete Independence Comments: antalgic gait, lacking full R knee extension in terminal stance   TODAY'S TREATMENT:                                                                                                                              DATE:   09/14/22 - Self Care: Findings, plan of care, recommendations, initial HEP - seated quad sets, gait training with SPC 2 x 40', safety.     PATIENT EDUCATION:  Education details: see self care Person educated: Patient Education method: Explanation, Demonstration, Verbal cues, and Handouts Education comprehension: verbalized understanding and returned demonstration  HOME EXERCISE PROGRAM: Access Code: [email or web address] URL: [email or web address] Date:  09/14/2022 Prepared by: Glenetta Hew  Exercises - Seated Quad Set  - 3 x daily - 7 x weekly - 1 sets - 10 reps - 5 sec hold  ASSESSMENT:  CLINICAL IMPRESSION: Monica Wallace  is a 72 y.o. female who was seen today for physical therapy evaluation and treatment for right knee arthritis and pain.   She demonstrates decreased RLE strength, decreased R knee ROM (7-95) and unsteadiness with gait, placing her at increased risk of falls.  She would benefit from skilled physical therapy to improve strength, ROM, pain and safety.  Today given initial HEP focusing on isometric quad sets to decrease pain and improve ROM, and trialed SPC as has one but does not feel confident with use.    OBJECTIVE IMPAIRMENTS: Abnormal gait, decreased activity tolerance, decreased balance, decreased endurance, decreased mobility, difficulty walking, decreased ROM, decreased strength, increased edema, increased fascial restrictions, increased muscle spasms, impaired flexibility, impaired sensation, and pain.   ACTIVITY LIMITATIONS: carrying, lifting, bending, sitting, standing, squatting, sleeping, stairs, transfers, bed mobility, locomotion level, and caring for others  PARTICIPATION LIMITATIONS: cleaning, driving, shopping, community activity, and occupation  PERSONAL FACTORS: Age, Time since onset of injury/illness/exacerbation, and 3+ comorbidities: asthma, hypertension, thyroid cancer, hypothyrodism, diabetes mellitus type 2, cervical radiculopathy, arthritis, history LBP, R meniscal tears and OA    are also affecting patient's functional outcome.   REHAB POTENTIAL: Good  CLINICAL DECISION MAKING: Evolving/moderate complexity  EVALUATION COMPLEXITY: Moderate   GOALS: Goals reviewed with patient? Yes  SHORT TERM GOALS: Target date: 09/28/2022   Patient will be independent with initial HEP. Baseline: given Goal status: INITIAL   LONG TERM GOALS: Target date: 10/26/2022   Patient will be independent  with advanced/ongoing HEP to improve outcomes and carryover.  Baseline:  Goal status: INITIAL  2.  Patient will report at least 75% improvement in Right knee pain to improve QOL. Baseline:  Goal status: INITIAL  3.  Patient will demonstrate improved R knee AROM to >/= 0-110 deg to allow for normal gait and  stair mechanics. Baseline: 7-95 Goal status: INITIAL  4.  Patient will demonstrate improved functional LE strength as demonstrated by improved 5x STS to < 20 sec. Baseline: 28 seconds Goal status: INITIAL  5.  Patient will be able to ambulate 600' with LRAD and normal gait pattern without increased pain to access community.  Baseline: antalgic gait, visually slow gait speed Goal status: INITIAL  6. Patient will be able to ascend/descend stairs with 1 HR and reciprocal step pattern safely to access home and community.  Baseline: painful descending stairs Goal status: INITIAL  7.  Patient will report >45/80 on LEFS  to demonstrate improved functional ability. Baseline: 33/80 Goal status: INITIAL  8.  Patient will demonstrate at least 19/24 on DGI to decrease risk of falls. Baseline: NT Goal status: INITIAL   PLAN:  PT FREQUENCY: 1-2x/week  PT DURATION: 6 weeks  PLANNED INTERVENTIONS: Therapeutic exercises, Therapeutic activity, Neuromuscular re-education, Balance training, Gait training, Patient/Family education, Self Care, Joint mobilization, Stair training, Orthotic/Fit training, Dry Needling, Electrical stimulation, Cryotherapy, Moist heat, Taping, Vasopneumatic device, Ionotophoresis '4mg'$ /ml Dexamethasone, Manual therapy, and Re-evaluation  PLAN FOR NEXT SESSION: DGI, progress HEP with focus on knee strengthening, manual therapy and modalities PRN   Rennie Natter, PT, DPT  09/14/2022, 5:30 PM

## 2022-09-20 ENCOUNTER — Encounter: Payer: Self-pay | Admitting: Physical Therapy

## 2022-09-20 ENCOUNTER — Ambulatory Visit: Payer: Medicare Other | Attending: Orthopedic Surgery | Admitting: Physical Therapy

## 2022-09-20 DIAGNOSIS — R2681 Unsteadiness on feet: Secondary | ICD-10-CM | POA: Insufficient documentation

## 2022-09-20 DIAGNOSIS — M25561 Pain in right knee: Secondary | ICD-10-CM | POA: Diagnosis not present

## 2022-09-20 DIAGNOSIS — R262 Difficulty in walking, not elsewhere classified: Secondary | ICD-10-CM | POA: Diagnosis not present

## 2022-09-20 DIAGNOSIS — M6281 Muscle weakness (generalized): Secondary | ICD-10-CM | POA: Insufficient documentation

## 2022-09-20 NOTE — Therapy (Signed)
OUTPATIENT PHYSICAL THERAPY LOWER EXTREMITY EVALUATION   Patient Name: Monica Wallace MRN: [id number] DOB:04-03-1951, 72 y.o., female Today's Date: 09/20/2022  END OF SESSION:  PT End of Session - 09/20/22 1707     Visit Number 2    Number of Visits 12    Date for PT Re-Evaluation 10/26/22    Authorization Type Medicare & AARP    Progress Note Due on Visit 10    PT Start Time 1703    PT Stop Time 1750    PT Time Calculation (min) 47 min    Activity Tolerance Patient tolerated treatment well    Behavior During Therapy WFL for tasks assessed/performed             Past Medical History:  Diagnosis Date   Arthritis    Asthma    Baker's cyst of knee, right 02/16/2017   Cancer ([place]) 2002   thyroid (2002)   Cystic breast    Diabetes ([place])    GERD (gastroesophageal reflux disease)    Hepatic cyst 02/02/2015   Hyperlipidemia    Hypertension    Insomnia 07/23/2016   Menopause    Migraine    Palpitations    Preventative health care 01/18/2016   Sleep apnea 02/02/2015   Thyroid disease    Past Surgical History:  Procedure Laterality Date   ABDOMINAL HYSTERECTOMY     took right ovary and uterus   APPENDECTOMY     CARPAL TUNNEL RELEASE Right    CESAREAN SECTION     CHOLECYSTECTOMY     EYE SURGERY Bilateral 2017   cataracts   KNEE ARTHROPLASTY     SHOULDER ARTHROSCOPY W/ ROTATOR CUFF REPAIR Left    THYROIDECTOMY  2002   Patient Active Problem List   Diagnosis Date Noted   Mild persistent asthma without complication [id number]   Dysfunction of both eustachian tubes 04/22/2022   UTI symptoms 04/07/2022   Hx of papillary thyroid carcinoma 02/28/2022   Postoperative hypothyroidism 02/28/2022   Left ankle pain 02/09/2022   Dizziness 02/08/2022   Edema 02/08/2022   Unsteady gait 11/14/2021   Lumbar radiculopathy 09/09/2021   Cervical cancer screening 03/07/2021   Sinusitis 10/30/2020   Allergic conjunctivitis 04/08/2020   Neck pain 02/06/2020   Right knee pain  09/30/2019   COVID-19 09/30/2019   Educated about COVID-19 virus infection 12/05/2018   Baker's cyst of knee, right 02/16/2017   Insomnia 07/23/2016   Hypokalemia 07/23/2016   Controlled type 2 diabetes mellitus without complication, without long-term current use of insulin ([place]) 01/24/2016   Preventative health care 01/18/2016   Left-sided low back pain with left-sided sciatica 10/07/2015   Obesity (BMI 30-39.9) 06/05/2015   OSA (obstructive sleep apnea) 02/02/2015   Palpitations 09/19/2014   Plantar fasciitis, bilateral 11/19/2013   Pronation deformity of ankle, acquired 11/19/2013   DJD (degenerative joint disease) 08/26/2013   Fibrocystic breast 08/26/2013   HTN (hypertension) 08/26/2013   GERD (gastroesophageal reflux disease) 08/26/2013   S/P hysterectomy with oophorectomy 08/26/2013   Hypothyroidism 08/26/2013   Chronic hoarseness 08/26/2013   Colon polyp 08/26/2013   Hiatal hernia 08/26/2013   Irritable bowel syndrome 08/26/2013   Moderate persistent asthma 08/26/2013   Allergic rhinoconjunctivitis 08/26/2013   Hyperlipidemia 08/22/2013   Migraine 08/22/2013   Thyroid cancer ([place]) 08/22/2013    PCP: [name], MD  REFERRING PROVIDER: Willaim Sheng, MD  REFERRING DIAG: M17.9 (ICD-10-CM) - arthritis of knee  THERAPY DIAG:  Acute pain of right knee  Unsteadiness on  feet  Muscle weakness (generalized)  Difficulty in walking, not elsewhere classified  Rationale for Evaluation and Treatment: Rehabilitation  ONSET DATE: chronic, worsened over last couple of months  SUBJECTIVE:   SUBJECTIVE STATEMENT: Monica Wallace reports baker cyst seems to be filling up again.  Knee is just very achey today.  Has been performing HEP.   PERTINENT HISTORY: asthma, hypertension, thyroid cancer, hypothyrodism, diabetes mellitus type 2, cervical radiculopathy, arthritis, history LBP, R meniscal tears and OA, history of R knee surgery x 2  PAIN:  Are you having  pain? Yes: NPRS scale: 3/10 Pain location: R lateral knee Pain description: dull ache, but increases to 7/10 at night Aggravating factors: laying down, prolonged sitting, standing and walking, driving prolonged periods, stairs Relieving factors: tylenol, ibuprofen  PRECAUTIONS: Fall  WEIGHT BEARING RESTRICTIONS: No  FALLS:  Has patient fallen in last 6 months?  No but frequently stumbles, has almost fallen numerous times.   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   PLOF: Independent  PATIENT GOALS: get right leg stronger, improve balance, avoid TKA  NEXT MD VISIT: none currently schedule with orthopedist  OBJECTIVE:   DIAGNOSTIC FINDINGS: MR R knee 02/19/2022 IMPRESSION: 1. Attenuation of the posterior horn of the medial meniscus consistent with prior partial meniscectomy. Peripheral meniscal extrusion. No new meniscal tear. 2. Severe attenuation of the anterior horn and body of the lateral meniscus consistent with prior subtotal meniscectomy. Small oblique tear of the posterior horn of the lateral meniscus extending to the inferior articular surface. 3. Tricompartmental cartilage abnormalities as described above.  PATIENT SURVEYS:  LEFS 33/80 = 41.25%  COGNITION: Overall cognitive status: Within functional limits for tasks assessed     SENSATION: Slightly diminished R upper thigh L2 dermatome  EDEMA:  Circumferential: proximal knee 2 cm, distal knee 1 cm larger on R compared to left.   MUSCLE LENGTH:   POSTURE: rounded shoulders and forward head  PALPATION: Tenderness along R lateral joint line  LOWER EXTREMITY ROM:  Passive ROM Right eval Left eval  Knee flexion 95 120  Knee extension 7 0    (Blank rows = not tested)  LOWER EXTREMITY MMT:  MMT Right eval Left eval  Hip flexion 3+ 4+  Hip extension    Hip abduction 5 5  Hip adduction 5 5  Knee flexion 4+p! 5  Knee extension 5p! 5  Ankle dorsiflexion 5 5  Ankle plantarflexion     (Blank rows = not tested)  LOWER EXTREMITY SPECIAL TESTS:  NT  FUNCTIONAL TESTS:  5 times sit to stand: 28 seconds with intermittant UE assist, noted R knee buckling 1x.  DGI 09/20/2022 - 18/24   [facility] PT Assessment - 09/20/22 0001       Standardized Balance Assessment   Standardized Balance Assessment Dynamic Gait Index      Dynamic Gait Index   Level Surface Mild Impairment    Change in Gait Speed Moderate Impairment    Gait with Horizontal Head Turns Normal    Gait with Vertical Head Turns Normal    Gait and Pivot Turn Normal    Step Over Obstacle Mild Impairment    Step Around Obstacles Normal    Steps Moderate Impairment    Total Score 18  GAIT: Distance walked: 67' Assistive device utilized: None Level of assistance: Complete Independence Comments: antalgic gait, lacking full R knee extension in terminal stance   TODAY'S TREATMENT:                                                                                                                              DATE:   09/20/2022 Therapeutic Exercise: to improve strength and mobility.  Demo, verbal and tactile cues throughout for technique. Bike - had to stop, too painful DGI SLR with quad set 2 x 10 bil  Hip abduction 2 x 10 bil  Hip extension 2 x 10 bil  Hamstring curls on P-ball x 20 Manual Therapy: to decrease muscle spasm and pain and improve mobility IASTM to R peroneals, ant tib, vastus lateralis Ionotophoresis with 1.0 mL '4mg'$ /ml Dexamethasone - 4-6 hour patch (18m-min) to R lateral knee (patch #1 of 6).  Educated on removal time and precautions.    09/14/22 - Self Care: Findings, plan of care, recommendations, initial HEP - seated quad sets, gait  training with SPC 2 x 40', safety.     PATIENT EDUCATION:  Education details: iontophoresis, HEP update Person educated: Patient Education method: Explanation, Demonstration, Verbal cues, and Handouts Education comprehension: verbalized understanding and returned demonstration  HOME EXERCISE PROGRAM: Access Code: [email or web address] URL: [email or web address] Date: 09/14/2022 Prepared by: EGlenetta Hew Exercises - Seated Quad Set  - 3 x daily - 7 x weekly - 1 sets - 10 reps - 5 sec hold  ASSESSMENT:  CLINICAL IMPRESSION: Monica PASLAYreports good compliance with initial HEP, and noted better extension in R knee today, however knee flexion was still very painful and unable to complete revolutions on bike today, so performed DGI as part of warm-up, scored 18/24 placing her at higher risk for falls.  She was able to tolerate progression of HEP today without increased pain.  Manual therapy to decrease tightness in R knee and associated musculature, then applied 4 hour patch with 1 ml dexamethasone (4g/ml) to R lateral knee after education on precautions, indications, and how and when to remove patch.   Monica RANKcontinues to demonstrate potential for improvement and would benefit from continued skilled therapy to address impairments.    OBJECTIVE IMPAIRMENTS: Abnormal gait, decreased activity tolerance, decreased balance, decreased endurance, decreased mobility, difficulty walking, decreased ROM, decreased strength, increased edema, increased fascial restrictions, increased muscle spasms, impaired flexibility, impaired sensation, and pain.   ACTIVITY LIMITATIONS: carrying, lifting, bending, sitting, standing, squatting, sleeping, stairs, transfers, bed mobility, locomotion level, and caring for others  PARTICIPATION LIMITATIONS: cleaning, driving, shopping, community activity, and occupation  PERSONAL FACTORS: Age, Time since onset of injury/illness/exacerbation, and 3+  comorbidities: asthma, hypertension, thyroid cancer, hypothyrodism, diabetes mellitus type 2, cervical radiculopathy, arthritis, history LBP, R meniscal tears and OA    are also affecting patient's functional outcome.   REHAB POTENTIAL: Good  CLINICAL DECISION MAKING: Evolving/moderate complexity  EVALUATION COMPLEXITY: Moderate  GOALS: Goals reviewed with patient? Yes  SHORT TERM GOALS: Target date: 09/28/2022   Patient will be independent with initial HEP. Baseline: given Goal status: IN PROGRESS 09/20/22- compliant, progressed.    LONG TERM GOALS: Target date: 10/26/2022   Patient will be independent with advanced/ongoing HEP to improve outcomes and carryover.  Baseline:  Goal status: IN PROGRESS  2.  Patient will report at least 75% improvement in Right knee pain to improve QOL. Baseline:  Goal status: IN PROGRESS  3.  Patient will demonstrate improved R knee AROM to >/= 0-110 deg to allow for normal gait and stair mechanics. Baseline: 7-95 Goal status: IN PROGRESS  4.  Patient will demonstrate improved functional LE strength as demonstrated by improved 5x STS to < 20 sec. Baseline: 28 seconds Goal status: IN PROGRESS  5.  Patient will be able to ambulate 600' with LRAD and normal gait pattern without increased pain to access community.  Baseline: antalgic gait, visually slow gait speed Goal status: IN PROGRESS  6. Patient will be able to ascend/descend stairs with 1 HR and reciprocal step pattern safely to access home and community.  Baseline: painful descending stairs Goal status: IN PROGRESS  7.  Patient will report >45/80 on LEFS  to demonstrate improved functional ability. Baseline: 33/80 Goal status: IN PROGRESS  8.  Patient will demonstrate at least 19/24 on DGI to decrease risk of falls. Baseline: 18/24 Goal status: IN PROGRESS   PLAN:  PT FREQUENCY: 1-2x/week  PT DURATION: 6 weeks  PLANNED INTERVENTIONS: Therapeutic exercises, Therapeutic activity,  Neuromuscular re-education, Balance training, Gait training, Patient/Family education, Self Care, Joint mobilization, Stair training, Orthotic/Fit training, Dry Needling, Electrical stimulation, Cryotherapy, Moist heat, Taping, Vasopneumatic device, Ionotophoresis '4mg'$ /ml Dexamethasone, Manual therapy, and Re-evaluation  PLAN FOR NEXT SESSION: progress HEP with focus on knee strengthening, manual therapy and modalities PRN   Rennie Natter, PT, DPT  09/20/2022, 6:05 PM

## 2022-09-20 NOTE — Patient Instructions (Signed)

## 2022-09-22 ENCOUNTER — Other Ambulatory Visit: Payer: Self-pay | Admitting: Family Medicine

## 2022-09-26 ENCOUNTER — Ambulatory Visit: Payer: Medicare Other

## 2022-09-26 DIAGNOSIS — M6281 Muscle weakness (generalized): Secondary | ICD-10-CM

## 2022-09-26 DIAGNOSIS — M25561 Pain in right knee: Secondary | ICD-10-CM | POA: Diagnosis not present

## 2022-09-26 DIAGNOSIS — R2681 Unsteadiness on feet: Secondary | ICD-10-CM

## 2022-09-26 DIAGNOSIS — R262 Difficulty in walking, not elsewhere classified: Secondary | ICD-10-CM

## 2022-09-26 NOTE — Therapy (Signed)
OUTPATIENT PHYSICAL THERAPY TREATMENT   Patient Name: Monica Wallace MRN: [id number] DOB:Nov 10, 1950, 72 y.o., female Today's Date: 09/26/2022  END OF SESSION:  PT End of Session - 09/26/22 1720     Visit Number 3    Number of Visits 12    Date for PT Re-Evaluation 10/26/22    Authorization Type Medicare & [employer]    Progress Note Due on Visit 10    PT Start Time 1701    PT Stop Time 1745    PT Time Calculation (min) 44 min    Activity Tolerance Patient tolerated treatment well    Behavior During Therapy WFL for tasks assessed/performed              Past Medical History:  Diagnosis Date   Arthritis    Asthma    Baker's cyst of knee, right 02/16/2017   Cancer ([place]) 2002   thyroid (2002)   Cystic breast    Diabetes ([place])    GERD (gastroesophageal reflux disease)    Hepatic cyst 02/02/2015   Hyperlipidemia    Hypertension    Insomnia 07/23/2016   Menopause    Migraine    Palpitations    Preventative health care 01/18/2016   Sleep apnea 02/02/2015   Thyroid disease    Past Surgical History:  Procedure Laterality Date   ABDOMINAL HYSTERECTOMY     took right ovary and uterus   APPENDECTOMY     CARPAL TUNNEL RELEASE Right    CESAREAN SECTION     CHOLECYSTECTOMY     EYE SURGERY Bilateral 2017   cataracts   KNEE ARTHROPLASTY     SHOULDER ARTHROSCOPY W/ ROTATOR CUFF REPAIR Left    THYROIDECTOMY  2002   Patient Active Problem List   Diagnosis Date Noted   Mild persistent asthma without complication [id number]   Dysfunction of both eustachian tubes 04/22/2022   UTI symptoms 04/07/2022   Hx of papillary thyroid carcinoma 02/28/2022   Postoperative hypothyroidism 02/28/2022   Left ankle pain 02/09/2022   Dizziness 02/08/2022   Edema 02/08/2022   Unsteady gait 11/14/2021   Lumbar radiculopathy 09/09/2021   Cervical cancer screening 03/07/2021   Sinusitis 10/30/2020   Allergic conjunctivitis 04/08/2020   Neck pain 02/06/2020   Right knee pain 09/30/2019    COVID-19 09/30/2019   Educated about COVID-19 virus infection 12/05/2018   Baker's cyst of knee, right 02/16/2017   Insomnia 07/23/2016   Hypokalemia 07/23/2016   Controlled type 2 diabetes mellitus without complication, without long-term current use of insulin ([place]) 01/24/2016   Preventative health care 01/18/2016   Left-sided low back pain with left-sided sciatica 10/07/2015   Obesity (BMI 30-39.9) 06/05/2015   OSA (obstructive sleep apnea) 02/02/2015   Palpitations 09/19/2014   Plantar fasciitis, bilateral 11/19/2013   Pronation deformity of ankle, acquired 11/19/2013   DJD (degenerative joint disease) 08/26/2013   Fibrocystic breast 08/26/2013   HTN (hypertension) 08/26/2013   GERD (gastroesophageal reflux disease) 08/26/2013   S/P hysterectomy with oophorectomy 08/26/2013   Hypothyroidism 08/26/2013   Chronic hoarseness 08/26/2013   Colon polyp 08/26/2013   Hiatal hernia 08/26/2013   Irritable bowel syndrome 08/26/2013   Moderate persistent asthma 08/26/2013   Allergic rhinoconjunctivitis 08/26/2013   Hyperlipidemia 08/22/2013   Migraine 08/22/2013   Thyroid cancer ([place]) 08/22/2013    PCP: [name], MD  REFERRING PROVIDER: Willaim Sheng, MD  REFERRING DIAG: M17.9 (ICD-10-CM) - arthritis of knee  THERAPY DIAG:  Acute pain of right knee  Unsteadiness on feet  Muscle weakness (generalized)  Difficulty in walking, not elsewhere classified  Rationale for Evaluation and Treatment: Rehabilitation  ONSET DATE: chronic, worsened over last couple of months  SUBJECTIVE:   SUBJECTIVE STATEMENT: R knee is sore and painful today.  PERTINENT HISTORY: asthma, hypertension, thyroid cancer, hypothyrodism, diabetes mellitus type 2, cervical radiculopathy, arthritis, history LBP, R meniscal tears and OA, history of R knee surgery x 2  PAIN:  Are you having pain? Yes: NPRS scale: 3/10 Pain location: R lateral knee Pain description: sore, pain Aggravating  factors: laying down, prolonged sitting, standing and walking, driving prolonged periods, stairs Relieving factors: tylenol, ibuprofen  PRECAUTIONS: Fall  WEIGHT BEARING RESTRICTIONS: No  FALLS:  Has patient fallen in last 6 months?  No but frequently stumbles, has almost fallen numerous times.   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   PLOF: Independent  PATIENT GOALS: get right leg stronger, improve balance, avoid TKA  NEXT MD VISIT: none currently schedule with orthopedist  OBJECTIVE:   DIAGNOSTIC FINDINGS: MR R knee 02/19/2022 IMPRESSION: 1. Attenuation of the posterior horn of the medial meniscus consistent with prior partial meniscectomy. Peripheral meniscal extrusion. No new meniscal tear. 2. Severe attenuation of the anterior horn and body of the lateral meniscus consistent with prior subtotal meniscectomy. Small oblique tear of the posterior horn of the lateral meniscus extending to the inferior articular surface. 3. Tricompartmental cartilage abnormalities as described above.  PATIENT SURVEYS:  LEFS 33/80 = 41.25%  COGNITION: Overall cognitive status: Within functional limits for tasks assessed     SENSATION: Slightly diminished R upper thigh L2 dermatome  EDEMA:  Circumferential: proximal knee 2 cm, distal knee 1 cm larger on R compared to left.   MUSCLE LENGTH:   POSTURE: rounded shoulders and forward head  PALPATION: Tenderness along R lateral joint line  LOWER EXTREMITY ROM:  Passive ROM Right eval Left eval  Knee flexion 95 120  Knee extension 7 0   (Blank rows = not tested)  LOWER EXTREMITY MMT:  MMT Right eval Left eval  Hip flexion 3+ 4+  Hip extension    Hip  abduction 5 5  Hip adduction 5 5  Knee flexion 4+p! 5  Knee extension 5p! 5  Ankle dorsiflexion 5 5  Ankle plantarflexion     (Blank rows = not tested)  LOWER EXTREMITY SPECIAL TESTS:  NT  FUNCTIONAL TESTS:  5 times sit to stand: 28 seconds with intermittant UE assist, noted R knee buckling 1x.  DGI 09/20/2022 - 18/24     GAIT: Distance walked: 74' Assistive device utilized: None Level of assistance: Complete Independence Comments: antalgic gait, lacking full R knee extension in terminal stance   TODAY'S TREATMENT:  DATE:  09/26/22 Therapeutic Exercise: to improve strength and mobility.  Demo, verbal and tactile cues throughout for technique. Nustep L3x78mn Supine R quad set 10x5" Supine R QS with SLR 2x10 R S/L clamshell RTB x 15 R S/L hip abduction 2x10 Supine bridge 2x10 Wall squat 10x5"  Manual Therapy: to decrease muscle spasm, pain and improve mobility.  R knee flexion mobs in sitting grade I-II for pain R knee gentle PROM with joint distraction  09/20/2022 Therapeutic Exercise: to improve strength and mobility.  Demo, verbal and tactile cues throughout for technique. Bike - had to stop, too painful DGI SLR with quad set 2 x 10 bil  Hip abduction 2 x 10 bil  Hip extension 2 x 10 bil  Hamstring curls on P-ball x 20 Manual Therapy: to decrease muscle spasm and pain and improve mobility IASTM to R peroneals, ant tib, vastus lateralis Ionotophoresis with 1.0 mL '4mg'$ /ml Dexamethasone - 4-6 hour patch (819mmin) to R lateral knee (patch #1 of 6).  Educated on removal time and precautions.    09/14/22 - Self Care: Findings, plan of care, recommendations, initial HEP - seated quad sets, gait training with SPC 2 x 40', safety.     PATIENT EDUCATION:  Education details: iontophoresis, HEP update Person educated: Patient Education method:  Explanation, Demonstration, Verbal cues, and Handouts Education comprehension: verbalized understanding and returned demonstration  HOME EXERCISE PROGRAM: Access Code: [email or web address] URL: [email or web address] Date: 09/26/2022 Prepared by: BrClarene EssexExercises - Seated Quad Set  - 3 x daily - 7 x weekly - 1 sets - 10 reps - 5 sec hold - Supine Active Straight Leg Raise  - 1 x daily - 7 x weekly - 3 sets - 10 reps - Sidelying Hip Abduction  - 1 x daily - 7 x weekly - 3 sets - 10 reps - Prone Hip Extension  - 1 x daily - 7 x weekly - 3 sets - 10 reps - Clam with Resistance  - 1 x daily - 3-4 x weekly - 2 sets - 10 reps - Wall Quarter Squat  - 1 x daily - 3-4 x weekly - 2 sets - 10 reps  ASSESSMENT:  CLINICAL IMPRESSION: Continued with progression of AROM and strengthening exercises to target knee mobility restrictions and weakness. She was able to complete all interventions with no issues. Stairs was a big concern for her so we started working on mini squats on wall and mini side lunges for WB strength. She responded well to the joint mobilizations today noting improved pain with distracting R knee joint. Pt would continue to benefit from skilled therapy to address deficits.   OBJECTIVE IMPAIRMENTS: Abnormal gait, decreased activity tolerance, decreased balance, decreased endurance, decreased mobility, difficulty walking, decreased ROM, decreased strength, increased edema, increased fascial restrictions, increased muscle spasms, impaired flexibility, impaired sensation, and pain.   ACTIVITY LIMITATIONS: carrying, lifting, bending, sitting, standing, squatting, sleeping, stairs, transfers, bed mobility, locomotion level, and caring for others  PARTICIPATION LIMITATIONS: cleaning, driving, shopping, community activity, and occupation  PERSONAL FACTORS: Age, Time since onset of injury/illness/exacerbation, and 3+ comorbidities: asthma, hypertension, thyroid cancer, hypothyrodism,  diabetes mellitus type 2, cervical radiculopathy, arthritis, history LBP, R meniscal tears and OA    are also affecting patient's functional outcome.   REHAB POTENTIAL: Good  CLINICAL DECISION MAKING: Evolving/moderate complexity  EVALUATION COMPLEXITY: Moderate   GOALS: Goals reviewed with patient? Yes  SHORT TERM GOALS: Target date: 09/28/2022   Patient will be independent with initial HEP. Baseline: given  Goal status: IN PROGRESS 09/20/22- compliant, progressed.    LONG TERM GOALS: Target date: 10/26/2022   Patient will be independent with advanced/ongoing HEP to improve outcomes and carryover.  Baseline:  Goal status: IN PROGRESS  2.  Patient will report at least 75% improvement in Right knee pain to improve QOL. Baseline:  Goal status: IN PROGRESS  3.  Patient will demonstrate improved R knee AROM to >/= 0-110 deg to allow for normal gait and stair mechanics. Baseline: 7-95 Goal status: IN PROGRESS  4.  Patient will demonstrate improved functional LE strength as demonstrated by improved 5x STS to < 20 sec. Baseline: 28 seconds Goal status: IN PROGRESS  5.  Patient will be able to ambulate 600' with LRAD and normal gait pattern without increased pain to access community.  Baseline: antalgic gait, visually slow gait speed Goal status: IN PROGRESS  6. Patient will be able to ascend/descend stairs with 1 HR and reciprocal step pattern safely to access home and community.  Baseline: painful descending stairs Goal status: IN PROGRESS  7.  Patient will report >45/80 on LEFS  to demonstrate improved functional ability. Baseline: 33/80 Goal status: IN PROGRESS  8.  Patient will demonstrate at least 19/24 on DGI to decrease risk of falls. Baseline: 18/24 Goal status: IN PROGRESS   PLAN:  PT FREQUENCY: 1-2x/week  PT DURATION: 6 weeks  PLANNED INTERVENTIONS: Therapeutic exercises, Therapeutic activity, Neuromuscular re-education, Balance training, Gait training,  Patient/Family education, Self Care, Joint mobilization, Stair training, Orthotic/Fit training, Dry Needling, Electrical stimulation, Cryotherapy, Moist heat, Taping, Vasopneumatic device, Ionotophoresis '4mg'$ /ml Dexamethasone, Manual therapy, and Re-evaluation  PLAN FOR NEXT SESSION: try ionto patch again; progress HEP with focus on knee strengthening, manual therapy and modalities PRN   Artist Pais, PTA 09/26/2022, 6:01 PM

## 2022-09-29 ENCOUNTER — Encounter: Payer: Self-pay | Admitting: Physical Therapy

## 2022-09-29 ENCOUNTER — Ambulatory Visit: Payer: Medicare Other | Admitting: Physical Therapy

## 2022-09-29 DIAGNOSIS — R262 Difficulty in walking, not elsewhere classified: Secondary | ICD-10-CM | POA: Diagnosis not present

## 2022-09-29 DIAGNOSIS — M6281 Muscle weakness (generalized): Secondary | ICD-10-CM

## 2022-09-29 DIAGNOSIS — M25561 Pain in right knee: Secondary | ICD-10-CM

## 2022-09-29 DIAGNOSIS — R2681 Unsteadiness on feet: Secondary | ICD-10-CM | POA: Diagnosis not present

## 2022-09-29 NOTE — Therapy (Signed)
OUTPATIENT PHYSICAL THERAPY TREATMENT   Patient Name: Monica Wallace MRN: [id number] DOB:May 25, 1951, 72 y.o., female Today's Date: 09/29/2022  END OF SESSION:  PT End of Session - 09/29/22 1705     Visit Number 4    Number of Visits 12    Date for PT Re-Evaluation 10/26/22    Authorization Type Medicare & [employer]    Progress Note Due on Visit 10    PT Start Time 1702    PT Stop Time 1750    PT Time Calculation (min) 48 min    Activity Tolerance Patient tolerated treatment well    Behavior During Therapy WFL for tasks assessed/performed              Past Medical History:  Diagnosis Date   Arthritis    Asthma    Baker's cyst of knee, right 02/16/2017   Cancer ([place]) 2002   thyroid (2002)   Cystic breast    Diabetes ([place])    GERD (gastroesophageal reflux disease)    Hepatic cyst 02/02/2015   Hyperlipidemia    Hypertension    Insomnia 07/23/2016   Menopause    Migraine    Palpitations    Preventative health care 01/18/2016   Sleep apnea 02/02/2015   Thyroid disease    Past Surgical History:  Procedure Laterality Date   ABDOMINAL HYSTERECTOMY     took right ovary and uterus   APPENDECTOMY     CARPAL TUNNEL RELEASE Right    CESAREAN SECTION     CHOLECYSTECTOMY     EYE SURGERY Bilateral 2017   cataracts   KNEE ARTHROPLASTY     SHOULDER ARTHROSCOPY W/ ROTATOR CUFF REPAIR Left    THYROIDECTOMY  2002   Patient Active Problem List   Diagnosis Date Noted   Mild persistent asthma without complication [id number]   Dysfunction of both eustachian tubes 04/22/2022   UTI symptoms 04/07/2022   Hx of papillary thyroid carcinoma 02/28/2022   Postoperative hypothyroidism 02/28/2022   Left ankle pain 02/09/2022   Dizziness 02/08/2022   Edema 02/08/2022   Unsteady gait 11/14/2021   Lumbar radiculopathy 09/09/2021   Cervical cancer screening 03/07/2021   Sinusitis 10/30/2020   Allergic conjunctivitis 04/08/2020   Neck pain 02/06/2020   Right knee pain 09/30/2019    COVID-19 09/30/2019   Educated about COVID-19 virus infection 12/05/2018   Baker's cyst of knee, right 02/16/2017   Insomnia 07/23/2016   Hypokalemia 07/23/2016   Controlled type 2 diabetes mellitus without complication, without long-term current use of insulin ([place]) 01/24/2016   Preventative health care 01/18/2016   Left-sided low back pain with left-sided sciatica 10/07/2015   Obesity (BMI 30-39.9) 06/05/2015   OSA (obstructive sleep apnea) 02/02/2015   Palpitations 09/19/2014   Plantar fasciitis, bilateral 11/19/2013   Pronation deformity of ankle, acquired 11/19/2013   DJD (degenerative joint disease) 08/26/2013   Fibrocystic breast 08/26/2013   HTN (hypertension) 08/26/2013   GERD (gastroesophageal reflux disease) 08/26/2013   S/P hysterectomy with oophorectomy 08/26/2013   Hypothyroidism 08/26/2013   Chronic hoarseness 08/26/2013   Colon polyp 08/26/2013   Hiatal hernia 08/26/2013   Irritable bowel syndrome 08/26/2013   Moderate persistent asthma 08/26/2013   Allergic rhinoconjunctivitis 08/26/2013   Hyperlipidemia 08/22/2013   Migraine 08/22/2013   Thyroid cancer ([place]) 08/22/2013    PCP: [name], MD  REFERRING PROVIDER: Willaim Sheng, MD  REFERRING DIAG: M17.9 (ICD-10-CM) - arthritis of knee  THERAPY DIAG:  Acute pain of right knee  Unsteadiness on feet  Muscle weakness (generalized)  Difficulty in walking, not elsewhere classified  Rationale for Evaluation and Treatment: Rehabilitation  ONSET DATE: chronic, worsened over last couple of months  SUBJECTIVE:   SUBJECTIVE STATEMENT: Woke up and R knee sore tried to buckle, worried going to fall.  Not feeling well today, allergies.  Not sure if she slept on her knee wrong, uses pillow between knees and sleeps on R side.   PERTINENT HISTORY: asthma, hypertension, thyroid cancer, hypothyrodism, diabetes mellitus type 2, cervical radiculopathy, arthritis, history LBP, R meniscal tears and OA,  history of R knee surgery x 2  PAIN:  Are you having pain? Yes: NPRS scale: 4-5/10 Pain location: R lateral knee Pain description: sore, pain Aggravating factors: laying down, prolonged sitting, standing and walking, driving prolonged periods, stairs Relieving factors: tylenol, ibuprofen  PRECAUTIONS: Fall  WEIGHT BEARING RESTRICTIONS: No  FALLS:  Has patient fallen in last 6 months?  No but frequently stumbles, has almost fallen numerous times.   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   PLOF: Independent  PATIENT GOALS: get right leg stronger, improve balance, avoid TKA  NEXT MD VISIT: none currently schedule with orthopedist  OBJECTIVE:   DIAGNOSTIC FINDINGS: MR R knee 02/19/2022 IMPRESSION: 1. Attenuation of the posterior horn of the medial meniscus consistent with prior partial meniscectomy. Peripheral meniscal extrusion. No new meniscal tear. 2. Severe attenuation of the anterior horn and body of the lateral meniscus consistent with prior subtotal meniscectomy. Small oblique tear of the posterior horn of the lateral meniscus extending to the inferior articular surface. 3. Tricompartmental cartilage abnormalities as described above.  PATIENT SURVEYS:  LEFS 33/80 = 41.25%  COGNITION: Overall cognitive status: Within functional limits for tasks assessed     SENSATION: Slightly diminished R upper thigh L2 dermatome  EDEMA:  Circumferential: proximal knee 2 cm, distal knee 1 cm larger on R compared to left.   MUSCLE LENGTH:   POSTURE: rounded shoulders and forward head  PALPATION: Tenderness along R lateral joint line  LOWER EXTREMITY ROM:  Passive ROM Right eval Left eval  Knee flexion 95  120  Knee extension 7 0   (Blank rows = not tested)  LOWER EXTREMITY MMT:  MMT Right eval Left eval  Hip flexion 3+ 4+  Hip extension    Hip abduction 5 5  Hip adduction 5 5  Knee flexion 4+p! 5  Knee extension 5p! 5  Ankle dorsiflexion 5 5  Ankle plantarflexion     (Blank rows = not tested)  LOWER EXTREMITY SPECIAL TESTS:  NT  FUNCTIONAL TESTS:  5 times sit to stand: 28 seconds with intermittant UE assist, noted R knee buckling 1x.  DGI 09/20/2022 - 18/24  GAIT: Distance walked: 70' Assistive device utilized: None Level of assistance: Complete Independence Comments: antalgic gait, lacking full R knee extension in terminal stance   TODAY'S TREATMENT:  DATE:  09/29/2022 Therapeutic Exercise: to improve strength and mobility.  Demo, verbal and tactile cues throughout for technique. Nustep L4 x 5 min  SAQ 1 x 10, 2# 1 x 10  SLR 2# x 10 bil, no resistance x 10 bil  S/L R hip abduction x 10, 2# weight at thigh x 10 Prone hip extension 2 x 10 bil  Manual Therapy: to decrease muscle spasm and pain and improve mobility STM/TPR to R quads, IASTM R quads, patellar mobs, skilled palpation and monitoring during dry needling. Trigger Point Dry-Needling  Treatment instructions: Expect mild to moderate muscle soreness. S/S of pneumothorax if dry needled over a lung field, and to seek immediate medical attention should they occur. Patient verbalized understanding of these instructions and education. Patient Consent Given: Yes Education handout provided: Previously provided Muscles treated: R vastus lateralis, vastus medialis, rectus femoris Electrical stimulation performed: No Parameters: N/A Treatment response/outcome: Twitch Response Elicited and Palpable Increase in Muscle Length Ionotophoresis with 1.0 mL '4mg'$ /ml Dexamethasone - 4-6 hour patch (16m-min) to R  lateral knee (patch #2 of 6).  Educated on removal time and precautions.      09/26/22 Therapeutic Exercise: to improve strength and mobility.  Demo, verbal and tactile cues throughout for technique. Nustep L3x629m Supine R quad set 10x5" Supine R QS with SLR 2x10 R S/L clamshell RTB x 15 R S/L hip abduction 2x10 Supine bridge 2x10 Wall squat 10x5"  Manual Therapy: to decrease muscle spasm, pain and improve mobility.  R knee flexion mobs in sitting grade I-II for pain R knee gentle PROM with joint distraction  09/20/2022 Therapeutic Exercise: to improve strength and mobility.  Demo, verbal and tactile cues throughout for technique. Bike - had to stop, too painful DGI SLR with quad set 2 x 10 bil  Hip abduction 2 x 10 bil  Hip extension 2 x 10 bil  Hamstring curls on P-ball x 20 Manual Therapy: to decrease muscle spasm and pain and improve mobility IASTM to R peroneals, ant tib, vastus lateralis Ionotophoresis with 1.0 mL '4mg'$ /ml Dexamethasone - 4-6 hour patch (8075min) to R lateral knee (patch #1 of 6).  Educated on removal time and precautions.       PATIENT EDUCATION:  Education details: review of TrDN and ionto precautions and aftercare.  Person educated: Patient Education method: Explanation, Demonstration, Verbal cues, and Handouts Education comprehension: verbalized understanding and returned demonstration  HOME EXERCISE PROGRAM: Access Code: [email or web address] URL: [email or web address] Date: 09/26/2022 Prepared by: BraClarene Essexxercises - Seated Quad Set  - 3 x daily - 7 x weekly - 1 sets - 10 reps - 5 sec hold - Supine Active Straight Leg Raise  - 1 x daily - 7 x weekly - 3 sets - 10 reps - Sidelying Hip Abduction  - 1 x daily - 7 x weekly - 3 sets - 10 reps - Prone Hip Extension  - 1 x daily - 7 x weekly - 3 sets - 10 reps - Clam with Resistance  - 1 x daily - 3-4 x weekly - 2 sets - 10 reps - Wall Quarter Squat  - 1 x daily - 3-4 x weekly - 2 sets - 10  reps  ASSESSMENT:  CLINICAL IMPRESSION: Monica Wallace more R medial pain today, more difficulty tolerating exercises, able to increase resistance slightly but fatigued very quickly.  Noted multiple palpable trigger points with manual therapy, after discussion consented to TrDN (has had with good effect in the past) to  R quads with good twitch response.  Applied patch #2 to lateral knee as felt 1st patch was helpful.  Monica Wallace continues to demonstrate potential for improvement and would benefit from continued skilled therapy to address impairments.        OBJECTIVE IMPAIRMENTS: Abnormal gait, decreased activity tolerance, decreased balance, decreased endurance, decreased mobility, difficulty walking, decreased ROM, decreased strength, increased edema, increased fascial restrictions, increased muscle spasms, impaired flexibility, impaired sensation, and pain.   ACTIVITY LIMITATIONS: carrying, lifting, bending, sitting, standing, squatting, sleeping, stairs, transfers, bed mobility, locomotion level, and caring for others  PARTICIPATION LIMITATIONS: cleaning, driving, shopping, community activity, and occupation  PERSONAL FACTORS: Age, Time since onset of injury/illness/exacerbation, and 3+ comorbidities: asthma, hypertension, thyroid cancer, hypothyrodism, diabetes mellitus type 2, cervical radiculopathy, arthritis, history LBP, R meniscal tears and OA    are also affecting patient's functional outcome.   REHAB POTENTIAL: Good  CLINICAL DECISION MAKING: Evolving/moderate complexity  EVALUATION COMPLEXITY: Moderate   GOALS: Goals reviewed with patient? Yes  SHORT TERM GOALS: Target date: 09/28/2022   Patient will be independent with initial HEP. Baseline: given Goal status: IN PROGRESS 09/20/22- compliant, progressed.    LONG TERM GOALS: Target date: 10/26/2022   Patient will be independent with advanced/ongoing HEP to improve outcomes and carryover.  Baseline:  Goal  status: IN PROGRESS  2.  Patient will report at least 75% improvement in Right knee pain to improve QOL. Baseline:  Goal status: IN PROGRESS  3.  Patient will demonstrate improved R knee AROM to >/= 0-110 deg to allow for normal gait and stair mechanics. Baseline: 7-95 Goal status: IN PROGRESS  4.  Patient will demonstrate improved functional LE strength as demonstrated by improved 5x STS to < 20 sec. Baseline: 28 seconds Goal status: IN PROGRESS  5.  Patient will be able to ambulate 600' with LRAD and normal gait pattern without increased pain to access community.  Baseline: antalgic gait, visually slow gait speed Goal status: IN PROGRESS  6. Patient will be able to ascend/descend stairs with 1 HR and reciprocal step pattern safely to access home and community.  Baseline: painful descending stairs Goal status: IN PROGRESS  7.  Patient will report >45/80 on LEFS  to demonstrate improved functional ability. Baseline: 33/80 Goal status: IN PROGRESS  8.  Patient will demonstrate at least 19/24 on DGI to decrease risk of falls. Baseline: 18/24 Goal status: IN PROGRESS   PLAN:  PT FREQUENCY: 1-2x/week  PT DURATION: 6 weeks  PLANNED INTERVENTIONS: Therapeutic exercises, Therapeutic activity, Neuromuscular re-education, Balance training, Gait training, Patient/Family education, Self Care, Joint mobilization, Stair training, Orthotic/Fit training, Dry Needling, Electrical stimulation, Cryotherapy, Moist heat, Taping, Vasopneumatic device, Ionotophoresis '4mg'$ /ml Dexamethasone, Manual therapy, and Re-evaluation  PLAN FOR NEXT SESSION: continue ionto; progress HEP with focus on knee strengthening, manual therapy and modalities PRN   Rennie Natter, PT, DPT  09/29/2022, 6:03 PM

## 2022-10-03 ENCOUNTER — Ambulatory Visit: Payer: Medicare Other

## 2022-10-03 ENCOUNTER — Ambulatory Visit: Payer: Medicare Other | Admitting: Family Medicine

## 2022-10-03 DIAGNOSIS — M25561 Pain in right knee: Secondary | ICD-10-CM | POA: Diagnosis not present

## 2022-10-03 DIAGNOSIS — M6281 Muscle weakness (generalized): Secondary | ICD-10-CM

## 2022-10-03 DIAGNOSIS — R262 Difficulty in walking, not elsewhere classified: Secondary | ICD-10-CM

## 2022-10-03 DIAGNOSIS — R2681 Unsteadiness on feet: Secondary | ICD-10-CM

## 2022-10-03 NOTE — Therapy (Signed)
OUTPATIENT PHYSICAL THERAPY TREATMENT   Patient Name: Monica Wallace MRN: [id number] DOB:18-Nov-1950, 72 y.o., female Today's Date: 10/03/2022  END OF SESSION:  PT End of Session - 10/03/22 1749     Visit Number 5    Number of Visits 12    Date for PT Re-Evaluation 10/26/22    Authorization Type Medicare & A[employer]    Progress Note Due on Visit 10    PT Start Time 1703    PT Stop Time 1747    PT Time Calculation (min) 44 min    Activity Tolerance Patient tolerated treatment well    Behavior During Therapy WFL for tasks assessed/performed               Past Medical History:  Diagnosis Date   Arthritis    Asthma    Baker's cyst of knee, right 02/16/2017   Cancer ([place]) 2002   thyroid (2002)   Cystic breast    Diabetes ([place])    GERD (gastroesophageal reflux disease)    Hepatic cyst 02/02/2015   Hyperlipidemia    Hypertension    Insomnia 07/23/2016   Menopause    Migraine    Palpitations    Preventative health care 01/18/2016   Sleep apnea 02/02/2015   Thyroid disease    Past Surgical History:  Procedure Laterality Date   ABDOMINAL HYSTERECTOMY     took right ovary and uterus   APPENDECTOMY     CARPAL TUNNEL RELEASE Right    CESAREAN SECTION     CHOLECYSTECTOMY     EYE SURGERY Bilateral 2017   cataracts   KNEE ARTHROPLASTY     SHOULDER ARTHROSCOPY W/ ROTATOR CUFF REPAIR Left    THYROIDECTOMY  2002   Patient Active Problem List   Diagnosis Date Noted   Mild persistent asthma without complication [id number]   Dysfunction of both eustachian tubes 04/22/2022   UTI symptoms 04/07/2022   Hx of papillary thyroid carcinoma 02/28/2022   Postoperative hypothyroidism 02/28/2022   Left ankle pain 02/09/2022   Dizziness 02/08/2022   Edema 02/08/2022   Unsteady gait 11/14/2021   Lumbar radiculopathy 09/09/2021   Cervical cancer screening 03/07/2021   Sinusitis 10/30/2020   Allergic conjunctivitis 04/08/2020   Neck pain 02/06/2020   Right knee pain 09/30/2019    COVID-19 09/30/2019   Educated about COVID-19 virus infection 12/05/2018   Baker's cyst of knee, right 02/16/2017   Insomnia 07/23/2016   Hypokalemia 07/23/2016   Controlled type 2 diabetes mellitus without complication, without long-term current use of insulin ([place]) 01/24/2016   Preventative health care 01/18/2016   Left-sided low back pain with left-sided sciatica 10/07/2015   Obesity (BMI 30-39.9) 06/05/2015   OSA (obstructive sleep apnea) 02/02/2015   Palpitations 09/19/2014   Plantar fasciitis, bilateral 11/19/2013   Pronation deformity of ankle, acquired 11/19/2013   DJD (degenerative joint disease) 08/26/2013   Fibrocystic breast 08/26/2013   HTN (hypertension) 08/26/2013   GERD (gastroesophageal reflux disease) 08/26/2013   S/P hysterectomy with oophorectomy 08/26/2013   Hypothyroidism 08/26/2013   Chronic hoarseness 08/26/2013   Colon polyp 08/26/2013   Hiatal hernia 08/26/2013   Irritable bowel syndrome 08/26/2013   Moderate persistent asthma 08/26/2013   Allergic rhinoconjunctivitis 08/26/2013   Hyperlipidemia 08/22/2013   Migraine 08/22/2013   Thyroid cancer ([place]) 08/22/2013    PCP: [name], MD  REFERRING PROVIDER: Willaim Sheng, MD  REFERRING DIAG: M17.9 (ICD-10-CM) - arthritis of knee  THERAPY DIAG:  Acute pain of right knee  Unsteadiness on  feet  Muscle weakness (generalized)  Difficulty in walking, not elsewhere classified  Rationale for Evaluation and Treatment: Rehabilitation  ONSET DATE: chronic, worsened over last couple of months  SUBJECTIVE:   SUBJECTIVE STATEMENT: "Having some soreness and pain in the L mid to lower back muscles but not really having any R knee pain."  PERTINENT HISTORY: asthma, hypertension, thyroid cancer, hypothyrodism, diabetes mellitus type 2, cervical radiculopathy, arthritis, history LBP, R meniscal tears and OA, history of R knee surgery x 2  PAIN:  Are you having pain? Yes: NPRS scale: 0/10 Pain  location: R lateral knee Pain description: sore, pain Aggravating factors: laying down, prolonged sitting, standing and walking, driving prolonged periods, stairs Relieving factors: tylenol, ibuprofen  PRECAUTIONS: Fall  WEIGHT BEARING RESTRICTIONS: No  FALLS:  Has patient fallen in last 6 months?  No but frequently stumbles, has almost fallen numerous times.   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   PLOF: Independent  PATIENT GOALS: get right leg stronger, improve balance, avoid TKA  NEXT MD VISIT: none currently schedule with orthopedist  OBJECTIVE:   DIAGNOSTIC FINDINGS: MR R knee 02/19/2022 IMPRESSION: 1. Attenuation of the posterior horn of the medial meniscus consistent with prior partial meniscectomy. Peripheral meniscal extrusion. No new meniscal tear. 2. Severe attenuation of the anterior horn and body of the lateral meniscus consistent with prior subtotal meniscectomy. Small oblique tear of the posterior horn of the lateral meniscus extending to the inferior articular surface. 3. Tricompartmental cartilage abnormalities as described above.  PATIENT SURVEYS:  LEFS 33/80 = 41.25%  COGNITION: Overall cognitive status: Within functional limits for tasks assessed     SENSATION: Slightly diminished R upper thigh L2 dermatome  EDEMA:  Circumferential: proximal knee 2 cm, distal knee 1 cm larger on R compared to left.   MUSCLE LENGTH:   POSTURE: rounded shoulders and forward head  PALPATION: Tenderness along R lateral joint line  LOWER EXTREMITY ROM:  Passive ROM Right eval Left eval Right 10/03/22  Knee flexion 95 120 99  Knee extension 7 0 5   (Blank rows = not tested)  LOWER EXTREMITY  MMT:  MMT Right eval Left eval  Hip flexion 3+ 4+  Hip extension    Hip abduction 5 5  Hip adduction 5 5  Knee flexion 4+p! 5  Knee extension 5p! 5  Ankle dorsiflexion 5 5  Ankle plantarflexion     (Blank rows = not tested)  LOWER EXTREMITY SPECIAL TESTS:  NT  FUNCTIONAL TESTS:  5 times sit to stand: 28 seconds with intermittant UE assist, noted R knee buckling 1x.  DGI 09/20/2022 - 18/24  GAIT: Distance walked: 64' Assistive device utilized: None Level of assistance: Complete Independence Comments: antalgic gait, lacking full R knee extension in terminal stance   TODAY'S TREATMENT:  DATE:  10/03/22 Therapeutic Exercise: to improve strength and mobility.  Demo, verbal and tactile cues throughout for technique. Nustep L5 x 6 min  Seated bil LAQ x 15 2# weight  Standing bil HS curl x 12 2# weight Wall squats 10x Toe raise x 20 against wall Heel raise 2x10 at wall Step up R LE 4' x 10  Lateral step up RLE 4' x 10 Supine L quad stretch with strap x 30 sec  09/29/2022 Therapeutic Exercise: to improve strength and mobility.  Demo, verbal and tactile cues throughout for technique. Nustep L4 x 5 min  SAQ 1 x 10, 2# 1 x 10  SLR 2# x 10 bil, no resistance x 10 bil  S/L R hip abduction x 10, 2# weight at thigh x 10 Prone hip extension 2 x 10 bil  Manual Therapy: to decrease muscle spasm and pain and improve mobility STM/TPR to R quads, IASTM R quads, patellar mobs, skilled palpation and monitoring during dry needling. Trigger Point Dry-Needling  Treatment instructions: Expect mild to moderate muscle soreness. S/S of pneumothorax if dry needled over a lung field, and to seek immediate medical attention should they occur. Patient verbalized understanding of these instructions and education. Patient Consent Given: Yes Education handout provided: Previously  provided Muscles treated: R vastus lateralis, vastus medialis, rectus femoris Electrical stimulation performed: No Parameters: N/A Treatment response/outcome: Twitch Response Elicited and Palpable Increase in Muscle Length Ionotophoresis with 1.0 mL 4mg /ml Dexamethasone - 4-6 hour patch (44mA-min) to R lateral knee (patch #2 of 6).  Educated on removal time and precautions.      09/26/22 Therapeutic Exercise: to improve strength and mobility.  Demo, verbal and tactile cues throughout for technique. Nustep L3x75min Supine R quad set 10x5" Supine R QS with SLR 2x10 R S/L clamshell RTB x 15 R S/L hip abduction 2x10 Supine bridge 2x10 Wall squat 10x5"  Manual Therapy: to decrease muscle spasm, pain and improve mobility.  R knee flexion mobs in sitting grade I-II for pain R knee gentle PROM with joint distraction  09/20/2022 Therapeutic Exercise: to improve strength and mobility.  Demo, verbal and tactile cues throughout for technique. Bike - had to stop, too painful DGI SLR with quad set 2 x 10 bil  Hip abduction 2 x 10 bil  Hip extension 2 x 10 bil  Hamstring curls on P-ball x 20 Manual Therapy: to decrease muscle spasm and pain and improve mobility IASTM to R peroneals, ant tib, vastus lateralis Ionotophoresis with 1.0 mL 4mg /ml Dexamethasone - 4-6 hour patch (70mA-min) to R lateral knee (patch #1 of 6).  Educated on removal time and precautions.       PATIENT EDUCATION:  Education details: review of TrDN and ionto precautions and aftercare.  Person educated: Patient Education method: Explanation, Demonstration, Verbal cues, and Handouts Education comprehension: verbalized understanding and returned demonstration  HOME EXERCISE PROGRAM: Access Code: [email or web address] URL: [email or web address] Date: 09/26/2022 Prepared by: Clarene Essex  Exercises - Seated Quad Set  - 3 x daily - 7 x weekly - 1 sets - 10 reps - 5 sec hold - Supine Active Straight Leg Raise  - 1 x  daily - 7 x weekly - 3 sets - 10 reps - Sidelying Hip Abduction  - 1 x daily - 7 x weekly - 3 sets - 10 reps - Prone Hip Extension  - 1 x daily - 7 x weekly - 3 sets - 10 reps - Clam with Resistance  - 1 x daily -  3-4 x weekly - 2 sets - 10 reps - Wall Quarter Squat  - 1 x daily - 3-4 x weekly - 2 sets - 10 reps  ASSESSMENT:  CLINICAL IMPRESSION: Continued with progressing knee strengthening, ROM, and flexibility exercises. Overall patient does report improved R knee pain and does attribute to ionto (she prefers to do this again on Thursday). She does demonstrate decreased stability standing on RLE with L standing HS curls and weakness with functional movements (steps ups and wall squats). She is showing improvement in R knee AROM today (5-99 deg), progressing toward LTG#3.       OBJECTIVE IMPAIRMENTS: Abnormal gait, decreased activity tolerance, decreased balance, decreased endurance, decreased mobility, difficulty walking, decreased ROM, decreased strength, increased edema, increased fascial restrictions, increased muscle spasms, impaired flexibility, impaired sensation, and pain.   ACTIVITY LIMITATIONS: carrying, lifting, bending, sitting, standing, squatting, sleeping, stairs, transfers, bed mobility, locomotion level, and caring for others  PARTICIPATION LIMITATIONS: cleaning, driving, shopping, community activity, and occupation  PERSONAL FACTORS: Age, Time since onset of injury/illness/exacerbation, and 3+ comorbidities: asthma, hypertension, thyroid cancer, hypothyrodism, diabetes mellitus type 2, cervical radiculopathy, arthritis, history LBP, R meniscal tears and OA    are also affecting patient's functional outcome.   REHAB POTENTIAL: Good  CLINICAL DECISION MAKING: Evolving/moderate complexity  EVALUATION COMPLEXITY: Moderate   GOALS: Goals reviewed with patient? Yes  SHORT TERM GOALS: Target date: 09/28/2022   Patient will be independent with initial HEP. Baseline:  given Goal status: IN PROGRESS 09/20/22- compliant, progressed.    LONG TERM GOALS: Target date: 10/26/2022   Patient will be independent with advanced/ongoing HEP to improve outcomes and carryover.  Baseline:  Goal status: IN PROGRESS  2.  Patient will report at least 75% improvement in Right knee pain to improve QOL. Baseline:  Goal status: IN PROGRESS  3.  Patient will demonstrate improved R knee AROM to >/= 0-110 deg to allow for normal gait and stair mechanics. Baseline: 7-95 Goal status: IN PROGRESS- 10/03/22 (5-99)  4.  Patient will demonstrate improved functional LE strength as demonstrated by improved 5x STS to < 20 sec. Baseline: 28 seconds Goal status: IN PROGRESS  5.  Patient will be able to ambulate 600' with LRAD and normal gait pattern without increased pain to access community.  Baseline: antalgic gait, visually slow gait speed Goal status: IN PROGRESS  6. Patient will be able to ascend/descend stairs with 1 HR and reciprocal step pattern safely to access home and community.  Baseline: painful descending stairs Goal status: IN PROGRESS  7.  Patient will report >45/80 on LEFS  to demonstrate improved functional ability. Baseline: 33/80 Goal status: IN PROGRESS  8.  Patient will demonstrate at least 19/24 on DGI to decrease risk of falls. Baseline: 18/24 Goal status: IN PROGRESS   PLAN:  PT FREQUENCY: 1-2x/week  PT DURATION: 6 weeks  PLANNED INTERVENTIONS: Therapeutic exercises, Therapeutic activity, Neuromuscular re-education, Balance training, Gait training, Patient/Family education, Self Care, Joint mobilization, Stair training, Orthotic/Fit training, Dry Needling, Electrical stimulation, Cryotherapy, Moist heat, Taping, Vasopneumatic device, Ionotophoresis 4mg /ml Dexamethasone, Manual therapy, and Re-evaluation  PLAN FOR NEXT SESSION: continue ionto; progress HEP with focus on knee strengthening, manual therapy and modalities PRN   Artist Pais,  PTA 10/03/2022, 5:50 PM

## 2022-10-04 ENCOUNTER — Ambulatory Visit (INDEPENDENT_AMBULATORY_CARE_PROVIDER_SITE_OTHER): Payer: Medicare Other | Admitting: Family Medicine

## 2022-10-04 ENCOUNTER — Encounter: Payer: Self-pay | Admitting: Family Medicine

## 2022-10-04 VITALS — BP 140/70 | HR 73 | Temp 97.9°F | Resp 18 | Ht 62.0 in | Wt 203.4 lb

## 2022-10-04 DIAGNOSIS — M545 Low back pain, unspecified: Secondary | ICD-10-CM | POA: Diagnosis not present

## 2022-10-04 LAB — POCT URINALYSIS DIPSTICK
Bilirubin, UA: NEGATIVE
Blood, UA: NEGATIVE
Glucose, UA: NEGATIVE
Ketones, UA: NEGATIVE
Leukocytes, UA: NEGATIVE
Nitrite, UA: NEGATIVE
Protein, UA: NEGATIVE
Spec Grav, UA: 1.01 (ref 1.010–1.025)
Urobilinogen, UA: 0.2 E.U./dL
pH, UA: 7.5 (ref 5.0–8.0)

## 2022-10-04 MED ORDER — CYCLOBENZAPRINE HCL 5 MG PO TABS: 5.0000 mg | ORAL_TABLET | Freq: Three times a day (TID) | ORAL | 1 refills | Status: DC | PRN

## 2022-10-04 NOTE — Patient Instructions (Addendum)
Urine sample was normal.  Let's treat as musculoskeletal. See below  BACK PAIN AVS  For many people, back pain returns. Since low back pain is rarely dangerous, it is often a condition that people can learn to manage on their own.  Remain active. It is stressful on the back to sit or stand in one place. Do not sit, drive, or stand in one place for more than 30 minutes at a time. Take short walks on level surfaces as soon as pain allows. Try to increase the length of time you walk each day.  Do not stay in bed. Resting more than 1 or 2 days can delay your recovery.  Do not avoid exercise or work. Your body is made to move. It is not dangerous to be active, even though your back may hurt. Your back will likely heal faster if you return to being active before your pain is gone.  Pay attention to your body when you  bend and lift. Many people have less discomfort when lifting if they bend their knees, keep the load close to their bodies, and avoid twisting. Often, the most comfortable positions are those that put less stress on your recovering back.  Find a comfortable position to sleep. Use a firm mattress and lie on your side with your knees slightly bent. If you lie on your back, put a pillow under your knees.  Only take over-the-counter or prescription medicines as directed by your caregiver. Over-the-counter medicines to reduce pain and inflammation are often the most helpful. Your caregiver may prescribe muscle relaxant drugs. These medicines help dull your pain so you can more quickly return to your normal activities and healthy exercise.  Put ice on the injured area.  Put ice in a plastic bag.  Place a towel between your skin and the bag.  Leave the ice on for 15 to 20 minutes, 3 to 4 times a day for the first 2 to 3 days. After that, ice and heat may be alternated to reduce pain and spasms.  Ask your caregiver about trying back exercises and gentle massage. This may be of some benefit.  Avoid  feeling anxious or stressed. Stress increases muscle tension and can worsen back pain. It is important to recognize when you are anxious or stressed and learn ways to manage it. Exercise is a great option.  SEEK IMMEDIATE MEDICAL CARE IF:  You have pain that radiates from your back into your legs.  You develop new bowel or bladder control problems.  You have unusual weakness or numbness in your arms or legs.  You develop nausea or vomiting.  You develop abdominal pain.  You feel faint.    BACK PAIN PLAN Continue with occasional tylenol and ibuprofen (you did not want to try meloxicam due to it causing upset stomach in the past). Adding as needed muscle relaxer - this might make you sleepy Plan of rest, intermittent application of cold packs (later, may switch to heat, but do not sleep on heating pad) Home exercises discussed. Handout provided.  Proper lifting mechanics with avoidance of heavy lifting discussed.  Recommended Physical Therapy - already established

## 2022-10-04 NOTE — Progress Notes (Signed)
Acute Office Visit  Subjective:     Patient ID: Monica Wallace, female    DOB: 12-02-1950, 72 y.o.   MRN: [id number]  Chief Complaint  Patient presents with   Back Pain    Pt says this feels like constant pain that is sometimes a spasm. Worsening x last Wednesday. Taking ibu and Tylenol. Worse on R side.        Patient is in today for right lower back pain.  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 She does have a history of lumbar radiculopathy with MRI last year showing moderate lower lumbar face arthrosis. States she will get intermittent muscle spasms at baseline, but this past week has been more bothersome - tight muscles, achy lower right back/flank. Reports this pain is a little bit higher than her usual low back pain. She has not had any consistent urgency, frequency, chills, fevers, hematuria. Pain is worse with twisting side to side. Pain fluctuating up to 7/10 with intermittent muscle spasms as well - sometimes makes it hard to get comfortable to sleep. She gets some relief with tylenol/ibuprofen and a heating pad. She denies any radiation of pain, numbness, tingling, incontinence.      All review of systems negative except what is listed in the HPI      Objective:    BP (!) 152/69 (BP Location: Left Arm, Patient Position: Sitting, Cuff Size: Large)   Pulse 73   Temp 97.9 F (36.6 C) (Temporal)   Resp 18   Ht 5\' 2"  (1.575 m)   Wt 203 lb 6.4 oz (92.3 kg)   SpO2 99%   BMI 37.20 kg/m    Physical Exam Vitals reviewed.  Constitutional:      Appearance: Normal appearance. She is obese.  Cardiovascular:     Rate and Rhythm: Normal rate and regular rhythm.  Pulmonary:     Effort: Pulmonary effort is normal.     Breath sounds: Normal breath sounds.  Abdominal:     Tenderness: There is no right CVA tenderness or left CVA tenderness.  Musculoskeletal:        General: Normal range of motion.      Comments: Right lower back with paraspinal muscle tension and tenderness to palpation  Skin:    General: Skin is warm and dry.  Neurological:     General: No focal deficit present.     Mental Status: She is alert and oriented to person, place, and time. Mental status is at baseline.  Psychiatric:        Mood and Affect: Mood normal.        Behavior: Behavior normal.        Thought Content: Thought content normal.        Judgment: Judgment normal.        Results for orders placed or performed in visit on 10/04/22  POCT Urinalysis Dipstick  Result Value Ref Range   Color, UA yellow    Clarity, UA clear    Glucose, UA Negative Negative   Bilirubin, UA negative    Ketones, UA negative    Spec Grav, UA 1.010 1.010 - 1.025   Blood, UA negative    pH, UA 7.5 5.0 - 8.0   Protein, UA Negative Negative   Urobilinogen, UA 0.2 0.2 or 1.0 E.U./dL   Nitrite, UA negative    Leukocytes, UA Negative Negative   Appearance clear  Odor none         Assessment & Plan:   Problem List Items Addressed This Visit   None Visit Diagnoses     Acute right-sided low back pain without sciatica    -  Primary Urine sample normal Continue with occasional tylenol and ibuprofen (you did not want to try meloxicam due to it causing upset stomach in the past). Adding as needed muscle relaxer - this might make you sleepy Plan of rest, intermittent application of cold packs (later, may switch to heat, but do not sleep on heating pad) Home exercises discussed. Handout provided.  Proper lifting mechanics with avoidance of heavy lifting discussed.  Recommended Physical Therapy - already established  Patient aware of signs/symptoms requiring further/urgent evaluation.     Relevant Medications   cyclobenzaprine (FLEXERIL) 5 MG tablet       Meds ordered this encounter  Medications   cyclobenzaprine (FLEXERIL) 5 MG tablet    Sig: Take 1 tablet (5 mg total) by mouth 3 (three) times daily as  needed for muscle spasms.    Dispense:  30 tablet    Refill:  1    Order Specific Question:   Supervising Provider    Answer:   [name] A [[id number]]    Return if symptoms worsen or fail to improve.  Terrilyn Saver, NP

## 2022-10-06 ENCOUNTER — Ambulatory Visit: Payer: Medicare Other

## 2022-10-06 DIAGNOSIS — M6281 Muscle weakness (generalized): Secondary | ICD-10-CM | POA: Diagnosis not present

## 2022-10-06 DIAGNOSIS — M25561 Pain in right knee: Secondary | ICD-10-CM

## 2022-10-06 DIAGNOSIS — R2681 Unsteadiness on feet: Secondary | ICD-10-CM

## 2022-10-06 DIAGNOSIS — R262 Difficulty in walking, not elsewhere classified: Secondary | ICD-10-CM | POA: Diagnosis not present

## 2022-10-06 NOTE — Therapy (Signed)
OUTPATIENT PHYSICAL THERAPY TREATMENT   Patient Name: Monica Wallace MRN: [id number] DOB:09-13-1950, 72 y.o., female Today's Date: 10/06/2022  END OF SESSION:  PT End of Session - 10/06/22 1802     Visit Number 6    Number of Visits 12    Date for PT Re-Evaluation 10/26/22    Authorization Type Medicare & [employer]    Progress Note Due on Visit 10    PT Start Time 1703    PT Stop Time 1748    PT Time Calculation (min) 45 min    Activity Tolerance Patient tolerated treatment well    Behavior During Therapy WFL for tasks assessed/performed                Past Medical History:  Diagnosis Date   Arthritis    Asthma    Baker's cyst of knee, right 02/16/2017   Cancer ([place]) 2002   thyroid (2002)   Cystic breast    Diabetes ([place])    GERD (gastroesophageal reflux disease)    Hepatic cyst 02/02/2015   Hyperlipidemia    Hypertension    Insomnia 07/23/2016   Menopause    Migraine    Palpitations    Preventative health care 01/18/2016   Sleep apnea 02/02/2015   Thyroid disease    Past Surgical History:  Procedure Laterality Date   ABDOMINAL HYSTERECTOMY     took right ovary and uterus   APPENDECTOMY     CARPAL TUNNEL RELEASE Right    CESAREAN SECTION     CHOLECYSTECTOMY     EYE SURGERY Bilateral 2017   cataracts   KNEE ARTHROPLASTY     SHOULDER ARTHROSCOPY W/ ROTATOR CUFF REPAIR Left    THYROIDECTOMY  2002   Patient Active Problem List   Diagnosis Date Noted   Mild persistent asthma without complication [id number]   Dysfunction of both eustachian tubes 04/22/2022   UTI symptoms 04/07/2022   Hx of papillary thyroid carcinoma 02/28/2022   Postoperative hypothyroidism 02/28/2022   Left ankle pain 02/09/2022   Dizziness 02/08/2022   Edema 02/08/2022   Unsteady gait 11/14/2021   Lumbar radiculopathy 09/09/2021   Cervical cancer screening 03/07/2021   Sinusitis 10/30/2020   Allergic conjunctivitis 04/08/2020   Neck pain 02/06/2020   Right knee pain 09/30/2019    COVID-19 09/30/2019   Educated about COVID-19 virus infection 12/05/2018   Baker's cyst of knee, right 02/16/2017   Insomnia 07/23/2016   Hypokalemia 07/23/2016   Controlled type 2 diabetes mellitus without complication, without long-term current use of insulin ([place]) 01/24/2016   Preventative health care 01/18/2016   Left-sided low back pain with left-sided sciatica 10/07/2015   Obesity (BMI 30-39.9) 06/05/2015   OSA (obstructive sleep apnea) 02/02/2015   Palpitations 09/19/2014   Plantar fasciitis, bilateral 11/19/2013   Pronation deformity of ankle, acquired 11/19/2013   DJD (degenerative joint disease) 08/26/2013   Fibrocystic breast 08/26/2013   HTN (hypertension) 08/26/2013   GERD (gastroesophageal reflux disease) 08/26/2013   S/P hysterectomy with oophorectomy 08/26/2013   Hypothyroidism 08/26/2013   Chronic hoarseness 08/26/2013   Colon polyp 08/26/2013   Hiatal hernia 08/26/2013   Irritable bowel syndrome 08/26/2013   Moderate persistent asthma 08/26/2013   Allergic rhinoconjunctivitis 08/26/2013   Hyperlipidemia 08/22/2013   Migraine 08/22/2013   Thyroid cancer ([place]) 08/22/2013    PCP: [name], MD  REFERRING PROVIDER: Willaim Sheng, MD  REFERRING DIAG: M17.9 (ICD-10-CM) - arthritis of knee  THERAPY DIAG:  Acute pain of right knee  Unsteadiness  on feet  Muscle weakness (generalized)  Difficulty in walking, not elsewhere classified  Rationale for Evaluation and Treatment: Rehabilitation  ONSET DATE: chronic, worsened over last couple of months  SUBJECTIVE:   SUBJECTIVE STATEMENT: Pt reports she went to NP across the hall and she said it may be muscle strain in the back.   PERTINENT HISTORY: asthma, hypertension, thyroid cancer, hypothyrodism, diabetes mellitus type 2, cervical radiculopathy, arthritis, history LBP, R meniscal tears and OA, history of R knee surgery x 2  PAIN:  Are you having pain? Yes: NPRS scale: 3/10 Pain location:  R lateral knee Pain description: sore, pain Aggravating factors: laying down, prolonged sitting, standing and walking, driving prolonged periods, stairs Relieving factors: tylenol, ibuprofen  PRECAUTIONS: Fall  WEIGHT BEARING RESTRICTIONS: No  FALLS:  Has patient fallen in last 6 months?  No but frequently stumbles, has almost fallen numerous times.   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   PLOF: Independent  PATIENT GOALS: get right leg stronger, improve balance, avoid TKA  NEXT MD VISIT: none currently schedule with orthopedist  OBJECTIVE:   DIAGNOSTIC FINDINGS: MR R knee 02/19/2022 IMPRESSION: 1. Attenuation of the posterior horn of the medial meniscus consistent with prior partial meniscectomy. Peripheral meniscal extrusion. No new meniscal tear. 2. Severe attenuation of the anterior horn and body of the lateral meniscus consistent with prior subtotal meniscectomy. Small oblique tear of the posterior horn of the lateral meniscus extending to the inferior articular surface. 3. Tricompartmental cartilage abnormalities as described above.  PATIENT SURVEYS:  LEFS 33/80 = 41.25%  COGNITION: Overall cognitive status: Within functional limits for tasks assessed     SENSATION: Slightly diminished R upper thigh L2 dermatome  EDEMA:  Circumferential: proximal knee 2 cm, distal knee 1 cm larger on R compared to left.   MUSCLE LENGTH:   POSTURE: rounded shoulders and forward head  PALPATION: Tenderness along R lateral joint line  LOWER EXTREMITY ROM:  Passive ROM Right eval Left eval Right 10/03/22  Knee flexion 95 120 99  Knee extension 7 0 5   (Blank rows = not tested)  LOWER EXTREMITY  MMT:  MMT Right eval Left eval  Hip flexion 3+ 4+  Hip extension    Hip abduction 5 5  Hip adduction 5 5  Knee flexion 4+p! 5  Knee extension 5p! 5  Ankle dorsiflexion 5 5  Ankle plantarflexion     (Blank rows = not tested)  LOWER EXTREMITY SPECIAL TESTS:  NT  FUNCTIONAL TESTS:  5 times sit to stand: 28 seconds with intermittant UE assist, noted R knee buckling 1x.  DGI 09/20/2022 - 18/24  GAIT: Distance walked: 92' Assistive device utilized: None Level of assistance: Complete Independence Comments: antalgic gait, lacking full R knee extension in terminal stance   TODAY'S TREATMENT:  DATE:  10/06/22 Therapeutic Exercise: to improve strength and mobility.  Demo, verbal and tactile cues throughout for technique. Nustep L5 x 6 min  Prone HS curls 4# 2x10 Step ups 4" 2x10 Lateral step ups 4' 2x10 Knee extension 10# 2x10 BLE  Manual Therapy: STM to R thoracic paraspinals and QL  Ionotophoresis with 1.0 mL 4mg /ml Dexamethasone - 4-6 hour patch (72mA-min) to R lateral knee (patch #3 of 6).   10/03/22 Therapeutic Exercise: to improve strength and mobility.  Demo, verbal and tactile cues throughout for technique. Nustep L5 x 6 min  Seated bil LAQ x 15 2# weight  Standing bil HS curl x 12 2# weight Wall squats 10x Toe raise x 20 against wall Heel raise 2x10 at wall Step up R LE 4' x 10  Lateral step up RLE 4' x 10 Supine L quad stretch with strap x 30 sec  09/29/2022 Therapeutic Exercise: to improve strength and mobility.  Demo, verbal and tactile cues throughout for technique. Nustep L4 x 5 min  SAQ 1 x 10, 2# 1 x 10  SLR 2# x 10 bil, no resistance x 10 bil  S/L R hip abduction x 10, 2# weight at thigh x 10 Prone hip extension 2 x 10 bil  Manual Therapy: to decrease muscle spasm and pain and improve mobility STM/TPR to R quads, IASTM R quads,  patellar mobs, skilled palpation and monitoring during dry needling. Trigger Point Dry-Needling  Treatment instructions: Expect mild to moderate muscle soreness. S/S of pneumothorax if dry needled over a lung field, and to seek immediate medical attention should they occur. Patient verbalized understanding of these instructions and education. Patient Consent Given: Yes Education handout provided: Previously provided Muscles treated: R vastus lateralis, vastus medialis, rectus femoris Electrical stimulation performed: No Parameters: N/A Treatment response/outcome: Twitch Response Elicited and Palpable Increase in Muscle Length Ionotophoresis with 1.0 mL 4mg /ml Dexamethasone - 4-6 hour patch (78mA-min) to R lateral knee (patch #2 of 6).  Educated on removal time and precautions.      09/26/22 Therapeutic Exercise: to improve strength and mobility.  Demo, verbal and tactile cues throughout for technique. Nustep L3x93min Supine R quad set 10x5" Supine R QS with SLR 2x10 R S/L clamshell RTB x 15 R S/L hip abduction 2x10 Supine bridge 2x10 Wall squat 10x5"  Manual Therapy: to decrease muscle spasm, pain and improve mobility.  R knee flexion mobs in sitting grade I-II for pain R knee gentle PROM with joint distraction  09/20/2022 Therapeutic Exercise: to improve strength and mobility.  Demo, verbal and tactile cues throughout for technique. Bike - had to stop, too painful DGI SLR with quad set 2 x 10 bil  Hip abduction 2 x 10 bil  Hip extension 2 x 10 bil  Hamstring curls on P-ball x 20 Manual Therapy: to decrease muscle spasm and pain and improve mobility IASTM to R peroneals, ant tib, vastus lateralis Ionotophoresis with 1.0 mL 4mg /ml Dexamethasone - 4-6 hour patch (74mA-min) to R lateral knee (patch #1 of 6).  Educated on removal time and precautions.       PATIENT EDUCATION:  Education details: review of TrDN and ionto precautions and aftercare.  Person educated: Patient Education  method: Explanation, Demonstration, Verbal cues, and Handouts Education comprehension: verbalized understanding and returned demonstration  HOME EXERCISE PROGRAM: Access Code: [email or web address] URL: [email or web address] Date: 09/26/2022 Prepared by: Clarene Essex  Exercises - Seated Quad Set  - 3 x daily - 7 x weekly - 1 sets - 10  reps - 5 sec hold - Supine Active Straight Leg Raise  - 1 x daily - 7 x weekly - 3 sets - 10 reps - Sidelying Hip Abduction  - 1 x daily - 7 x weekly - 3 sets - 10 reps - Prone Hip Extension  - 1 x daily - 7 x weekly - 3 sets - 10 reps - Clam with Resistance  - 1 x daily - 3-4 x weekly - 2 sets - 10 reps - Wall Quarter Squat  - 1 x daily - 3-4 x weekly - 2 sets - 10 reps  ASSESSMENT:  CLINICAL IMPRESSION: Pt arrived with complaints of R sided back pain, which was limiting her gait and exercise tolerance so we did some manual to reduce the tension in her back. After we did prone HS curls which went well but after 10 reps she began cramping in her hamstrings which subsided with stretching and STM. She did well with the knee exercises afterward although requiring supervision to ensure proper form with exercises. Concluded session with ionto patch to decrease inflammation and pain in R knee.      OBJECTIVE IMPAIRMENTS: Abnormal gait, decreased activity tolerance, decreased balance, decreased endurance, decreased mobility, difficulty walking, decreased ROM, decreased strength, increased edema, increased fascial restrictions, increased muscle spasms, impaired flexibility, impaired sensation, and pain.   ACTIVITY LIMITATIONS: carrying, lifting, bending, sitting, standing, squatting, sleeping, stairs, transfers, bed mobility, locomotion level, and caring for others  PARTICIPATION LIMITATIONS: cleaning, driving, shopping, community activity, and occupation  PERSONAL FACTORS: Age, Time since onset of injury/illness/exacerbation, and 3+ comorbidities: asthma,  hypertension, thyroid cancer, hypothyrodism, diabetes mellitus type 2, cervical radiculopathy, arthritis, history LBP, R meniscal tears and OA    are also affecting patient's functional outcome.   REHAB POTENTIAL: Good  CLINICAL DECISION MAKING: Evolving/moderate complexity  EVALUATION COMPLEXITY: Moderate   GOALS: Goals reviewed with patient? Yes  SHORT TERM GOALS: Target date: 09/28/2022   Patient will be independent with initial HEP. Baseline: given Goal status: IN PROGRESS 09/20/22- compliant, progressed.    LONG TERM GOALS: Target date: 10/26/2022   Patient will be independent with advanced/ongoing HEP to improve outcomes and carryover.  Baseline:  Goal status: IN PROGRESS  2.  Patient will report at least 75% improvement in Right knee pain to improve QOL. Baseline:  Goal status: IN PROGRESS  3.  Patient will demonstrate improved R knee AROM to >/= 0-110 deg to allow for normal gait and stair mechanics. Baseline: 7-95 Goal status: IN PROGRESS- 10/03/22 (5-99)  4.  Patient will demonstrate improved functional LE strength as demonstrated by improved 5x STS to < 20 sec. Baseline: 28 seconds Goal status: IN PROGRESS  5.  Patient will be able to ambulate 600' with LRAD and normal gait pattern without increased pain to access community.  Baseline: antalgic gait, visually slow gait speed Goal status: IN PROGRESS  6. Patient will be able to ascend/descend stairs with 1 HR and reciprocal step pattern safely to access home and community.  Baseline: painful descending stairs Goal status: IN PROGRESS  7.  Patient will report >45/80 on LEFS  to demonstrate improved functional ability. Baseline: 33/80 Goal status: IN PROGRESS  8.  Patient will demonstrate at least 19/24 on DGI to decrease risk of falls. Baseline: 18/24 Goal status: IN PROGRESS   PLAN:  PT FREQUENCY: 1-2x/week  PT DURATION: 6 weeks  PLANNED INTERVENTIONS: Therapeutic exercises, Therapeutic activity,  Neuromuscular re-education, Balance training, Gait training, Patient/Family education, Self Care, Joint mobilization, Stair  training, Orthotic/Fit training, Dry Needling, Electrical stimulation, Cryotherapy, Moist heat, Taping, Vasopneumatic device, Ionotophoresis 4mg /ml Dexamethasone, Manual therapy, and Re-evaluation  PLAN FOR NEXT SESSION: continue ionto; check on R sided back pain; progress HEP with focus on knee strengthening, manual therapy and modalities PRN   Artist Pais, PTA 10/06/2022, 6:04 PM

## 2022-10-07 ENCOUNTER — Other Ambulatory Visit: Payer: Self-pay | Admitting: Family Medicine

## 2022-10-10 ENCOUNTER — Ambulatory Visit: Payer: Medicare Other

## 2022-10-10 DIAGNOSIS — R262 Difficulty in walking, not elsewhere classified: Secondary | ICD-10-CM | POA: Diagnosis not present

## 2022-10-10 DIAGNOSIS — M25561 Pain in right knee: Secondary | ICD-10-CM

## 2022-10-10 DIAGNOSIS — M6281 Muscle weakness (generalized): Secondary | ICD-10-CM

## 2022-10-10 DIAGNOSIS — R2681 Unsteadiness on feet: Secondary | ICD-10-CM | POA: Diagnosis not present

## 2022-10-10 NOTE — Therapy (Signed)
OUTPATIENT PHYSICAL THERAPY TREATMENT   Patient Name: Monica Wallace MRN: [id number] DOB:1951-05-30, 72 y.o., female Today's Date: 10/10/2022  END OF SESSION:  PT End of Session - 10/10/22 1715     Visit Number 7    Number of Visits 12    Date for PT Re-Evaluation 10/26/22    Authorization Type Medicare & [employer]    Progress Note Due on Visit 10    PT Start Time 1705    PT Stop Time [id number]    PT Time Calculation (min) 43 min    Activity Tolerance Patient tolerated treatment well    Behavior During Therapy WFL for tasks assessed/performed                Past Medical History:  Diagnosis Date   Arthritis    Asthma    Baker's cyst of knee, right 02/16/2017   Cancer ([place]) 2002   thyroid (2002)   Cystic breast    Diabetes ([place])    GERD (gastroesophageal reflux disease)    Hepatic cyst 02/02/2015   Hyperlipidemia    Hypertension    Insomnia 07/23/2016   Menopause    Migraine    Palpitations    Preventative health care 01/18/2016   Sleep apnea 02/02/2015   Thyroid disease    Past Surgical History:  Procedure Laterality Date   ABDOMINAL HYSTERECTOMY     took right ovary and uterus   APPENDECTOMY     CARPAL TUNNEL RELEASE Right    CESAREAN SECTION     CHOLECYSTECTOMY     EYE SURGERY Bilateral 2017   cataracts   KNEE ARTHROPLASTY     SHOULDER ARTHROSCOPY W/ ROTATOR CUFF REPAIR Left    THYROIDECTOMY  2002   Patient Active Problem List   Diagnosis Date Noted   Mild persistent asthma without complication [id number]   Dysfunction of both eustachian tubes 04/22/2022   UTI symptoms 04/07/2022   Hx of papillary thyroid carcinoma 02/28/2022   Postoperative hypothyroidism 02/28/2022   Left ankle pain 02/09/2022   Dizziness 02/08/2022   Edema 02/08/2022   Unsteady gait 11/14/2021   Lumbar radiculopathy 09/09/2021   Cervical cancer screening 03/07/2021   Sinusitis 10/30/2020   Allergic conjunctivitis 04/08/2020   Neck pain 02/06/2020   Right knee pain 09/30/2019    COVID-19 09/30/2019   Educated about COVID-19 virus infection 12/05/2018   Baker's cyst of knee, right 02/16/2017   Insomnia 07/23/2016   Hypokalemia 07/23/2016   Controlled type 2 diabetes mellitus without complication, without long-term current use of insulin ([place]) 01/24/2016   Preventative health care 01/18/2016   Left-sided low back pain with left-sided sciatica 10/07/2015   Obesity (BMI 30-39.9) 06/05/2015   OSA (obstructive sleep apnea) 02/02/2015   Palpitations 09/19/2014   Plantar fasciitis, bilateral 11/19/2013   Pronation deformity of ankle, acquired 11/19/2013   DJD (degenerative joint disease) 08/26/2013   Fibrocystic breast 08/26/2013   HTN (hypertension) 08/26/2013   GERD (gastroesophageal reflux disease) 08/26/2013   S/P hysterectomy with oophorectomy 08/26/2013   Hypothyroidism 08/26/2013   Chronic hoarseness 08/26/2013   Colon polyp 08/26/2013   Hiatal hernia 08/26/2013   Irritable bowel syndrome 08/26/2013   Moderate persistent asthma 08/26/2013   Allergic rhinoconjunctivitis 08/26/2013   Hyperlipidemia 08/22/2013   Migraine 08/22/2013   Thyroid cancer ([place]) 08/22/2013    PCP: [name], MD  REFERRING PROVIDER: Willaim Sheng, MD  REFERRING DIAG: M17.9 (ICD-10-CM) - arthritis of knee  THERAPY DIAG:  Acute pain of right knee  Unsteadiness  on feet  Muscle weakness (generalized)  Difficulty in walking, not elsewhere classified  Rationale for Evaluation and Treatment: Rehabilitation  ONSET DATE: chronic, worsened over last couple of months  SUBJECTIVE:   SUBJECTIVE STATEMENT: Pt reports more aching and sore pain today in her lateral knee. She does report less pain in R mid back.   PERTINENT HISTORY: asthma, hypertension, thyroid cancer, hypothyrodism, diabetes mellitus type 2, cervical radiculopathy, arthritis, history LBP, R meniscal tears and OA, history of R knee surgery x 2  PAIN:  Are you having pain? Yes: NPRS scale:  5/10 Pain location: R lateral knee Pain description: sore, pain Aggravating factors: laying down, prolonged sitting, standing and walking, driving prolonged periods, stairs Relieving factors: tylenol, ibuprofen  PRECAUTIONS: Fall  WEIGHT BEARING RESTRICTIONS: No  FALLS:  Has patient fallen in last 6 months?  No but frequently stumbles, has almost fallen numerous times.   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   PLOF: Independent  PATIENT GOALS: get right leg stronger, improve balance, avoid TKA  NEXT MD VISIT: none currently schedule with orthopedist  OBJECTIVE:   DIAGNOSTIC FINDINGS: MR R knee 02/19/2022 IMPRESSION: 1. Attenuation of the posterior horn of the medial meniscus consistent with prior partial meniscectomy. Peripheral meniscal extrusion. No new meniscal tear. 2. Severe attenuation of the anterior horn and body of the lateral meniscus consistent with prior subtotal meniscectomy. Small oblique tear of the posterior horn of the lateral meniscus extending to the inferior articular surface. 3. Tricompartmental cartilage abnormalities as described above.  PATIENT SURVEYS:  LEFS 33/80 = 41.25%  COGNITION: Overall cognitive status: Within functional limits for tasks assessed     SENSATION: Slightly diminished R upper thigh L2 dermatome  EDEMA:  Circumferential: proximal knee 2 cm, distal knee 1 cm larger on R compared to left.   MUSCLE LENGTH:   POSTURE: rounded shoulders and forward head  PALPATION: Tenderness along R lateral joint line  LOWER EXTREMITY ROM:  Passive ROM Right eval Left eval Right 10/03/22  Knee flexion 95 120 99  Knee extension 7 0 5   (Blank rows = not  tested)  LOWER EXTREMITY MMT:  MMT Right eval Left eval  Hip flexion 3+ 4+  Hip extension    Hip abduction 5 5  Hip adduction 5 5  Knee flexion 4+p! 5  Knee extension 5p! 5  Ankle dorsiflexion 5 5  Ankle plantarflexion     (Blank rows = not tested)  LOWER EXTREMITY SPECIAL TESTS:  NT  FUNCTIONAL TESTS:  5 times sit to stand: 28 seconds with intermittant UE assist, noted R knee buckling 1x.  DGI 09/20/2022 - 18/24  GAIT: Distance walked: 59' Assistive device utilized: None Level of assistance: Complete Independence Comments: antalgic gait, lacking full R knee extension in terminal stance   TODAY'S TREATMENT:  DATE:  10/10/22 Therapeutic Exercise: to improve strength and mobility.  Demo, verbal and tactile cues throughout for technique. Nustep L5x64min Seated LAQ GTB x 10 bil Seated HS curls GTB x 10 bil Bil UE support: Standing hip abduction GTB at knees x 10 bil Standing hip extension GTB at knees x 10 bil Standing marches GTB at knees x 10 bil Mini squats with GTB at knees no UE support 2x10  10/06/22 Therapeutic Exercise: to improve strength and mobility.  Demo, verbal and tactile cues throughout for technique. Nustep L5 x 6 min  Prone HS curls 4# 2x10 Step ups 4" 2x10 Lateral step ups 4' 2x10 Knee extension 10# 2x10 BLE  Manual Therapy: STM to R thoracic paraspinals and QL  Ionotophoresis with 1.0 mL 4mg /ml Dexamethasone - 4-6 hour patch (67mA-min) to R lateral knee (patch #3 of 6).   10/03/22 Therapeutic Exercise: to improve strength and mobility.  Demo, verbal and tactile cues throughout for technique. Nustep L5 x 6 min  Seated bil LAQ x 15 2# weight  Standing bil HS curl x 12 2# weight Wall squats 10x Toe raise x 20 against wall Heel raise 2x10 at wall Step up R LE 4' x 10  Lateral step up RLE 4' x 10 Supine L quad stretch with  strap x 30 sec  09/29/2022 Therapeutic Exercise: to improve strength and mobility.  Demo, verbal and tactile cues throughout for technique. Nustep L4 x 5 min  SAQ 1 x 10, 2# 1 x 10  SLR 2# x 10 bil, no resistance x 10 bil  S/L R hip abduction x 10, 2# weight at thigh x 10 Prone hip extension 2 x 10 bil  Manual Therapy: to decrease muscle spasm and pain and improve mobility STM/TPR to R quads, IASTM R quads, patellar mobs, skilled palpation and monitoring during dry needling. Trigger Point Dry-Needling  Treatment instructions: Expect mild to moderate muscle soreness. S/S of pneumothorax if dry needled over a lung field, and to seek immediate medical attention should they occur. Patient verbalized understanding of these instructions and education. Patient Consent Given: Yes Education handout provided: Previously provided Muscles treated: R vastus lateralis, vastus medialis, rectus femoris Electrical stimulation performed: No Parameters: N/A Treatment response/outcome: Twitch Response Elicited and Palpable Increase in Muscle Length Ionotophoresis with 1.0 mL 4mg /ml Dexamethasone - 4-6 hour patch (63mA-min) to R lateral knee (patch #2 of 6).  Educated on removal time and precautions.      09/26/22 Therapeutic Exercise: to improve strength and mobility.  Demo, verbal and tactile cues throughout for technique. Nustep L3x86min Supine R quad set 10x5" Supine R QS with SLR 2x10 R S/L clamshell RTB x 15 R S/L hip abduction 2x10 Supine bridge 2x10 Wall squat 10x5"  Manual Therapy: to decrease muscle spasm, pain and improve mobility.  R knee flexion mobs in sitting grade I-II for pain R knee gentle PROM with joint distraction  09/20/2022 Therapeutic Exercise: to improve strength and mobility.  Demo, verbal and tactile cues throughout for technique. Bike - had to stop, too painful DGI SLR with quad set 2 x 10 bil  Hip abduction 2 x 10 bil  Hip extension 2 x 10 bil  Hamstring curls on P-ball x  20 Manual Therapy: to decrease muscle spasm and pain and improve mobility IASTM to R peroneals, ant tib, vastus lateralis Ionotophoresis with 1.0 mL 4mg /ml Dexamethasone - 4-6 hour patch (59mA-min) to R lateral knee (patch #1 of 6).  Educated on removal time and precautions.  PATIENT EDUCATION:  Education details: review of TrDN and ionto precautions and aftercare.  Person educated: Patient Education method: Explanation, Demonstration, Verbal cues, and Handouts Education comprehension: verbalized understanding and returned demonstration  HOME EXERCISE PROGRAM: Access Code: [email or web address] URL: [email or web address] Date: 10/10/2022 Prepared by: Clarene Essex  Exercises - Seated Quad Set  - 3 x daily - 7 x weekly - 1 sets - 10 reps - 5 sec hold - Supine Active Straight Leg Raise  - 1 x daily - 7 x weekly - 3 sets - 10 reps - Sidelying Hip Abduction  - 1 x daily - 7 x weekly - 3 sets - 10 reps - Prone Hip Extension  - 1 x daily - 7 x weekly - 3 sets - 10 reps - Wall Quarter Squat  - 1 x daily - 3-4 x weekly - 2 sets - 10 reps - Clam with Resistance  - 1 x daily - 3-4 x weekly - 2 sets - 10 reps - Seated Knee Extension with Resistance  - 1 x daily - 3-4 x weekly - 3 sets - 10 reps - Seated Knee Flexion with Anchored Resistance  - 1 x daily - 3-4 x weekly - 3 sets - 10 reps  ASSESSMENT:  CLINICAL IMPRESSION: Pt continues to have reports of pain along her R baker cyst.  She reports being limited with walking tolerance. Advanced through exercises to focus on knee strengthening and proximal strength. She had moderate pain to begin but post session noted improved pain. Postural cues given in standing with hip abduction to avoid lateral bending. She declined ionto patch today but notes benefit from use of the last trial.       OBJECTIVE IMPAIRMENTS: Abnormal gait, decreased activity tolerance, decreased balance, decreased endurance, decreased mobility, difficulty walking,  decreased ROM, decreased strength, increased edema, increased fascial restrictions, increased muscle spasms, impaired flexibility, impaired sensation, and pain.   ACTIVITY LIMITATIONS: carrying, lifting, bending, sitting, standing, squatting, sleeping, stairs, transfers, bed mobility, locomotion level, and caring for others  PARTICIPATION LIMITATIONS: cleaning, driving, shopping, community activity, and occupation  PERSONAL FACTORS: Age, Time since onset of injury/illness/exacerbation, and 3+ comorbidities: asthma, hypertension, thyroid cancer, hypothyrodism, diabetes mellitus type 2, cervical radiculopathy, arthritis, history LBP, R meniscal tears and OA    are also affecting patient's functional outcome.   REHAB POTENTIAL: Good  CLINICAL DECISION MAKING: Evolving/moderate complexity  EVALUATION COMPLEXITY: Moderate   GOALS: Goals reviewed with patient? Yes  SHORT TERM GOALS: Target date: 09/28/2022   Patient will be independent with initial HEP. Baseline: given Goal status: MET 10/10/22   LONG TERM GOALS: Target date: 10/26/2022   Patient will be independent with advanced/ongoing HEP to improve outcomes and carryover.  Baseline:  Goal status: IN PROGRESS  2.  Patient will report at least 75% improvement in Right knee pain to improve QOL. Baseline:  Goal status: IN PROGRESS  3.  Patient will demonstrate improved R knee AROM to >/= 0-110 deg to allow for normal gait and stair mechanics. Baseline: 7-95 Goal status: IN PROGRESS- 10/03/22 (5-99)  4.  Patient will demonstrate improved functional LE strength as demonstrated by improved 5x STS to < 20 sec. Baseline: 28 seconds Goal status: IN PROGRESS  5.  Patient will be able to ambulate 600' with LRAD and normal gait pattern without increased pain to access community.  Baseline: antalgic gait, visually slow gait speed Goal status: IN PROGRESS  6. Patient will be able to ascend/descend stairs with 1 HR and reciprocal step  pattern safely to access home and community.  Baseline: painful descending stairs Goal status: IN PROGRESS  7.  Patient will report >45/80 on LEFS  to demonstrate improved functional ability. Baseline: 33/80 Goal status: IN PROGRESS  8.  Patient will demonstrate at least 19/24 on DGI to decrease risk of falls. Baseline: 18/24 Goal status: IN PROGRESS   PLAN:  PT FREQUENCY: 1-2x/week  PT DURATION: 6 weeks  PLANNED INTERVENTIONS: Therapeutic exercises, Therapeutic activity, Neuromuscular re-education, Balance training, Gait training, Patient/Family education, Self Care, Joint mobilization, Stair training, Orthotic/Fit training, Dry Needling, Electrical stimulation, Cryotherapy, Moist heat, Taping, Vasopneumatic device, Ionotophoresis 4mg /ml Dexamethasone, Manual therapy, and Re-evaluation  PLAN FOR NEXT SESSION: continue ionto; progress HEP with focus on knee strengthening, manual therapy and modalities PRN   Artist Pais, PTA 10/10/2022, 5:52 PM

## 2022-10-13 ENCOUNTER — Encounter: Payer: Self-pay | Admitting: Physical Therapy

## 2022-10-13 ENCOUNTER — Ambulatory Visit: Payer: Medicare Other | Admitting: Physical Therapy

## 2022-10-13 DIAGNOSIS — M25561 Pain in right knee: Secondary | ICD-10-CM

## 2022-10-13 DIAGNOSIS — R2681 Unsteadiness on feet: Secondary | ICD-10-CM | POA: Diagnosis not present

## 2022-10-13 DIAGNOSIS — R262 Difficulty in walking, not elsewhere classified: Secondary | ICD-10-CM

## 2022-10-13 DIAGNOSIS — M6281 Muscle weakness (generalized): Secondary | ICD-10-CM | POA: Diagnosis not present

## 2022-10-13 NOTE — Therapy (Signed)
OUTPATIENT PHYSICAL THERAPY TREATMENT   Patient Name: Monica Wallace MRN: [id number] DOB:04/20/1951, 72 y.o., female Today's Date: 10/13/2022  END OF SESSION:  PT End of Session - 10/13/22 1658     Visit Number 8    Number of Visits 12    Date for PT Re-Evaluation 10/26/22    Authorization Type Medicare & [employer]    Progress Note Due on Visit 10    PT Start Time 1659    PT Stop Time [id number]    PT Time Calculation (min) 56 min    Activity Tolerance Patient tolerated treatment well    Behavior During Therapy WFL for tasks assessed/performed                Past Medical History:  Diagnosis Date   Arthritis    Asthma    Baker's cyst of knee, right 02/16/2017   Cancer ([place]) 2002   thyroid (2002)   Cystic breast    Diabetes ([place])    GERD (gastroesophageal reflux disease)    Hepatic cyst 02/02/2015   Hyperlipidemia    Hypertension    Insomnia 07/23/2016   Menopause    Migraine    Palpitations    Preventative health care 01/18/2016   Sleep apnea 02/02/2015   Thyroid disease    Past Surgical History:  Procedure Laterality Date   ABDOMINAL HYSTERECTOMY     took right ovary and uterus   APPENDECTOMY     CARPAL TUNNEL RELEASE Right    CESAREAN SECTION     CHOLECYSTECTOMY     EYE SURGERY Bilateral 2017   cataracts   KNEE ARTHROPLASTY     SHOULDER ARTHROSCOPY W/ ROTATOR CUFF REPAIR Left    THYROIDECTOMY  2002   Patient Active Problem List   Diagnosis Date Noted   Mild persistent asthma without complication [id number]   Dysfunction of both eustachian tubes 04/22/2022   UTI symptoms 04/07/2022   Hx of papillary thyroid carcinoma 02/28/2022   Postoperative hypothyroidism 02/28/2022   Left ankle pain 02/09/2022   Dizziness 02/08/2022   Edema 02/08/2022   Unsteady gait 11/14/2021   Lumbar radiculopathy 09/09/2021   Cervical cancer screening 03/07/2021   Sinusitis 10/30/2020   Allergic conjunctivitis 04/08/2020   Neck pain 02/06/2020   Right knee pain 09/30/2019    COVID-19 09/30/2019   Educated about COVID-19 virus infection 12/05/2018   Baker's cyst of knee, right 02/16/2017   Insomnia 07/23/2016   Hypokalemia 07/23/2016   Controlled type 2 diabetes mellitus without complication, without long-term current use of insulin ([place]) 01/24/2016   Preventative health care 01/18/2016   Left-sided low back pain with left-sided sciatica 10/07/2015   Obesity (BMI 30-39.9) 06/05/2015   OSA (obstructive sleep apnea) 02/02/2015   Palpitations 09/19/2014   Plantar fasciitis, bilateral 11/19/2013   Pronation deformity of ankle, acquired 11/19/2013   DJD (degenerative joint disease) 08/26/2013   Fibrocystic breast 08/26/2013   HTN (hypertension) 08/26/2013   GERD (gastroesophageal reflux disease) 08/26/2013   S/P hysterectomy with oophorectomy 08/26/2013   Hypothyroidism 08/26/2013   Chronic hoarseness 08/26/2013   Colon polyp 08/26/2013   Hiatal hernia 08/26/2013   Irritable bowel syndrome 08/26/2013   Moderate persistent asthma 08/26/2013   Allergic rhinoconjunctivitis 08/26/2013   Hyperlipidemia 08/22/2013   Migraine 08/22/2013   Thyroid cancer ([place]) 08/22/2013    PCP: [name], MD  REFERRING PROVIDER: Willaim Sheng, MD  REFERRING DIAG: M17.9 (ICD-10-CM) - arthritis of knee  THERAPY DIAG:  Acute pain of right knee  Unsteadiness  on feet  Muscle weakness (generalized)  Difficulty in walking, not elsewhere classified  Rationale for Evaluation and Treatment: Rehabilitation  ONSET DATE: chronic, worsened over last couple of months  SUBJECTIVE:   SUBJECTIVE STATEMENT: Having more pain and discomfort in R knee today, baker's cyst is bothering her again, and woke up with pain under R patella.   PERTINENT HISTORY: asthma, hypertension, thyroid cancer, hypothyrodism, diabetes mellitus type 2, cervical radiculopathy, arthritis, history LBP, R meniscal tears and OA, history of R knee surgery x 2  PAIN:  Are you having pain? Yes:  NPRS scale: 4/10 Pain location: R lateral knee Pain description: sore, pain Aggravating factors: laying down, prolonged sitting, standing and walking, driving prolonged periods, stairs Relieving factors: tylenol, ibuprofen  PRECAUTIONS: Fall  WEIGHT BEARING RESTRICTIONS: No  FALLS:  Has patient fallen in last 6 months?  No but frequently stumbles, has almost fallen numerous times.   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   PLOF: Independent  PATIENT GOALS: get right leg stronger, improve balance, avoid TKA  NEXT MD VISIT: none currently schedule with orthopedist  OBJECTIVE:   DIAGNOSTIC FINDINGS: MR R knee 02/19/2022 IMPRESSION: 1. Attenuation of the posterior horn of the medial meniscus consistent with prior partial meniscectomy. Peripheral meniscal extrusion. No new meniscal tear. 2. Severe attenuation of the anterior horn and body of the lateral meniscus consistent with prior subtotal meniscectomy. Small oblique tear of the posterior horn of the lateral meniscus extending to the inferior articular surface. 3. Tricompartmental cartilage abnormalities as described above.  PATIENT SURVEYS:  LEFS 33/80 = 41.25%  COGNITION: Overall cognitive status: Within functional limits for tasks assessed     SENSATION: Slightly diminished R upper thigh L2 dermatome  EDEMA:  Circumferential: proximal knee 2 cm, distal knee 1 cm larger on R compared to left.   MUSCLE LENGTH:   POSTURE: rounded shoulders and forward head  PALPATION: Tenderness along R lateral joint line  LOWER EXTREMITY ROM:  Passive ROM Right eval Left eval Right 10/03/22  Knee flexion 95 120 99  Knee extension 7 0 5   (Blank rows = not  tested)  LOWER EXTREMITY MMT:  MMT Right eval Left eval  Hip flexion 3+ 4+  Hip extension    Hip abduction 5 5  Hip adduction 5 5  Knee flexion 4+p! 5  Knee extension 5p! 5  Ankle dorsiflexion 5 5  Ankle plantarflexion     (Blank rows = not tested)  LOWER EXTREMITY SPECIAL TESTS:  NT  FUNCTIONAL TESTS:  5 times sit to stand: 28 seconds with intermittant UE assist, noted R knee buckling 1x.  DGI 09/20/2022 - 18/24  GAIT: Distance walked: 20' Assistive device utilized: None Level of assistance: Complete Independence Comments: antalgic gait, lacking full R knee extension in terminal stance   TODAY'S TREATMENT:  DATE:   10/13/22 Therapeutic Exercise: to improve strength and mobility.  Demo, verbal and tactile cues throughout for technique. Nustep L5 x 6 min  Step ups (6" step) 2 x 10 bil - cues to engage glutes with step up, reported decreased R knee pain.  Wall squats 10 x 5 sec hold.  Manual Therapy: to decrease muscle spasm and pain and improve mobility IASTM to R quads, STM/TPR to R knee, skilled palpation and monitoring during dry needling. Trigger Point Dry-Needling  Treatment instructions: Expect mild to moderate muscle soreness. S/S of pneumothorax if dry needled over a lung field, and to seek immediate medical attention should they occur. Patient verbalized understanding of these instructions and education. Patient Consent Given: Yes Education handout provided: Previously provided Muscles treated: R anterior tib Electrical stimulation performed: No Parameters: N/A Treatment response/outcome: Twitch Response Elicited and Palpable Increase in Muscle Length Ionotophoresis with 1.0 mL 4mg /ml Dexamethasone - 4-6 hour patch (35mA-min) to R lateral knee (patch #4 of 6).   10/10/22 Therapeutic Exercise: to improve strength and mobility.  Demo, verbal  and tactile cues throughout for technique. Nustep L5x51min Seated LAQ GTB x 10 bil Seated HS curls GTB x 10 bil Bil UE support: Standing hip abduction GTB at knees x 10 bil Standing hip extension GTB at knees x 10 bil Standing marches GTB at knees x 10 bil Mini squats with GTB at knees no UE support 2x10  10/06/22 Therapeutic Exercise: to improve strength and mobility.  Demo, verbal and tactile cues throughout for technique. Nustep L5 x 6 min  Prone HS curls 4# 2x10 Step ups 4" 2x10 Lateral step ups 4' 2x10 Knee extension 10# 2x10 BLE  Manual Therapy: STM to R thoracic paraspinals and QL  Ionotophoresis with 1.0 mL 4mg /ml Dexamethasone - 4-6 hour patch (53mA-min) to R lateral knee (patch #3 of 6).   10/03/22 Therapeutic Exercise: to improve strength and mobility.  Demo, verbal and tactile cues throughout for technique. Nustep L5 x 6 min  Seated bil LAQ x 15 2# weight  Standing bil HS curl x 12 2# weight Wall squats 10x Toe raise x 20 against wall Heel raise 2x10 at wall Step up R LE 4' x 10  Lateral step up RLE 4' x 10 Supine L quad stretch with strap x 30 sec   PATIENT EDUCATION:  Education details: review of TrDN and ionto precautions and aftercare.  Person educated: Patient Education method: Explanation, Demonstration, Verbal cues, and Handouts Education comprehension: verbalized understanding and returned demonstration  HOME EXERCISE PROGRAM: Access Code: [email or web address] URL: [email or web address] Date: 10/10/2022 Prepared by: Clarene Essex  Exercises - Seated Quad Set  - 3 x daily - 7 x weekly - 1 sets - 10 reps - 5 sec hold - Supine Active Straight Leg Raise  - 1 x daily - 7 x weekly - 3 sets - 10 reps - Sidelying Hip Abduction  - 1 x daily - 7 x weekly - 3 sets - 10 reps - Prone Hip Extension  - 1 x daily - 7 x weekly - 3 sets - 10 reps - Wall Quarter Squat  - 1 x daily - 3-4 x weekly - 2 sets - 10 reps - Clam with Resistance  - 1 x daily - 3-4 x  weekly - 2 sets - 10 reps - Seated Knee Extension with Resistance  - 1 x daily - 3-4 x weekly - 3 sets - 10 reps - Seated Knee Flexion with Anchored Resistance  - 1  x daily - 3-4 x weekly - 3 sets - 10 reps  ASSESSMENT:  CLINICAL IMPRESSION: Monica Wallace reported decreased knee pain on stairs with cues to engage glutes, however increased pain with wall squats today, unable to hold for more than 5 seconds.  Noted pain along lateral joint line/LCL primarily.  Manual therapy and TrDN to decrease muscle tightness to decrease pain, followed by iontophoresis to R lateral knee.  Monica Wallace continues to demonstrate potential for improvement and would benefit from continued skilled therapy to address impairments.         OBJECTIVE IMPAIRMENTS: Abnormal gait, decreased activity tolerance, decreased balance, decreased endurance, decreased mobility, difficulty walking, decreased ROM, decreased strength, increased edema, increased fascial restrictions, increased muscle spasms, impaired flexibility, impaired sensation, and pain.   ACTIVITY LIMITATIONS: carrying, lifting, bending, sitting, standing, squatting, sleeping, stairs, transfers, bed mobility, locomotion level, and caring for others  PARTICIPATION LIMITATIONS: cleaning, driving, shopping, community activity, and occupation  PERSONAL FACTORS: Age, Time since onset of injury/illness/exacerbation, and 3+ comorbidities: asthma, hypertension, thyroid cancer, hypothyrodism, diabetes mellitus type 2, cervical radiculopathy, arthritis, history LBP, R meniscal tears and OA    are also affecting patient's functional outcome.   REHAB POTENTIAL: Good  CLINICAL DECISION MAKING: Evolving/moderate complexity  EVALUATION COMPLEXITY: Moderate   GOALS: Goals reviewed with patient? Yes  SHORT TERM GOALS: Target date: 09/28/2022   Patient will be independent with initial HEP. Baseline: given Goal status: MET 10/10/22   LONG TERM GOALS: Target date:  10/26/2022   Patient will be independent with advanced/ongoing HEP to improve outcomes and carryover.  Baseline:  Goal status: IN PROGRESS  2.  Patient will report at least 75% improvement in Right knee pain to improve QOL. Baseline:  Goal status: IN PROGRESS  3.  Patient will demonstrate improved R knee AROM to >/= 0-110 deg to allow for normal gait and stair mechanics. Baseline: 7-95 Goal status: IN PROGRESS- 10/03/22 (5-99)  4.  Patient will demonstrate improved functional LE strength as demonstrated by improved 5x STS to < 20 sec. Baseline: 28 seconds Goal status: IN PROGRESS  5.  Patient will be able to ambulate 600' with LRAD and normal gait pattern without increased pain to access community.  Baseline: antalgic gait, visually slow gait speed Goal status: IN PROGRESS  6. Patient will be able to ascend/descend stairs with 1 HR and reciprocal step pattern safely to access home and community.  Baseline: painful descending stairs Goal status: IN PROGRESS  7.  Patient will report >45/80 on LEFS  to demonstrate improved functional ability. Baseline: 33/80 Goal status: IN PROGRESS  8.  Patient will demonstrate at least 19/24 on DGI to decrease risk of falls. Baseline: 18/24 Goal status: IN PROGRESS   PLAN:  PT FREQUENCY: 1-2x/week  PT DURATION: 6 weeks  PLANNED INTERVENTIONS: Therapeutic exercises, Therapeutic activity, Neuromuscular re-education, Balance training, Gait training, Patient/Family education, Self Care, Joint mobilization, Stair training, Orthotic/Fit training, Dry Needling, Electrical stimulation, Cryotherapy, Moist heat, Taping, Vasopneumatic device, Ionotophoresis 4mg /ml Dexamethasone, Manual therapy, and Re-evaluation  PLAN FOR NEXT SESSION: continue ionto; progress HEP with focus on knee strengthening, manual therapy and modalities PRN   Rennie Natter, PT, DPT  10/13/2022, 6:07 PM

## 2022-10-17 ENCOUNTER — Ambulatory Visit: Payer: Medicare Other | Admitting: Physical Therapy

## 2022-10-20 ENCOUNTER — Ambulatory Visit: Payer: Medicare Other | Attending: Orthopedic Surgery | Admitting: Physical Therapy

## 2022-10-20 ENCOUNTER — Encounter: Payer: Self-pay | Admitting: Physical Therapy

## 2022-10-20 DIAGNOSIS — R2681 Unsteadiness on feet: Secondary | ICD-10-CM | POA: Insufficient documentation

## 2022-10-20 DIAGNOSIS — R262 Difficulty in walking, not elsewhere classified: Secondary | ICD-10-CM | POA: Diagnosis not present

## 2022-10-20 DIAGNOSIS — M25561 Pain in right knee: Secondary | ICD-10-CM | POA: Diagnosis not present

## 2022-10-20 DIAGNOSIS — M6281 Muscle weakness (generalized): Secondary | ICD-10-CM | POA: Insufficient documentation

## 2022-10-20 NOTE — Therapy (Signed)
OUTPATIENT PHYSICAL THERAPY TREATMENT Progress Note Reporting Period 09/14/2022 to 10/20/2022  See note below for Objective Data and Assessment of Progress/Goals.     Patient Name: Monica Wallace MRN: [id number] DOB:[birth year], 72 y.o., female Today's Date: 10/20/2022  END OF SESSION:  PT End of Session - 10/20/22 1708     Visit Number 9    Number of Visits 12    Date for PT Re-Evaluation 11/17/22    Authorization Type Medicare & [employer]    Progress Note Due on Visit --   progress note completed visit 9   PT Start Time 1700    PT Stop Time 1740    PT Time Calculation (min) 40 min    Activity Tolerance Patient tolerated treatment well    Behavior During Therapy WFL for tasks assessed/performed                Past Medical History:  Diagnosis Date   Arthritis    Asthma    Baker's cyst of knee, right 02/16/2017   Cancer 2002   thyroid (2002)   Cystic breast    Diabetes    GERD (gastroesophageal reflux disease)    Hepatic cyst 02/02/2015   Hyperlipidemia    Hypertension    Insomnia 07/23/2016   Menopause    Migraine    Palpitations    Preventative health care 01/18/2016   Sleep apnea 02/02/2015   Thyroid disease    Past Surgical History:  Procedure Laterality Date   ABDOMINAL HYSTERECTOMY     took right ovary and uterus   APPENDECTOMY     CARPAL TUNNEL RELEASE Right    CESAREAN SECTION     CHOLECYSTECTOMY     EYE SURGERY Bilateral 2017   cataracts   KNEE ARTHROPLASTY     SHOULDER ARTHROSCOPY W/ ROTATOR CUFF REPAIR Left    THYROIDECTOMY  2002   Patient Active Problem List   Diagnosis Date Noted   Mild persistent asthma without complication [id number]   Dysfunction of both eustachian tubes 04/22/2022   UTI symptoms 04/07/2022   Hx of papillary thyroid carcinoma 02/28/2022   Postoperative hypothyroidism 02/28/2022   Left ankle pain 02/09/2022   Dizziness 02/08/2022   Edema 02/08/2022   Unsteady gait 11/14/2021   Lumbar radiculopathy 09/09/2021    Cervical cancer screening 03/07/2021   Sinusitis 10/30/2020   Allergic conjunctivitis 04/08/2020   Neck pain 02/06/2020   Right knee pain 09/30/2019   COVID-19 09/30/2019   Educated about COVID-19 virus infection 12/05/2018   Baker's cyst of knee, right 02/16/2017   Insomnia 07/23/2016   Hypokalemia 07/23/2016   Controlled type 2 diabetes mellitus without complication, without long-term current use of insulin 01/24/2016   Preventative health care 01/18/2016   Left-sided low back pain with left-sided sciatica 10/07/2015   Obesity (BMI 30-39.9) 06/05/2015   OSA (obstructive sleep apnea) 02/02/2015   Palpitations 09/19/2014   Plantar fasciitis, bilateral 11/19/2013   Pronation deformity of ankle, acquired 11/19/2013   DJD (degenerative joint disease) 08/26/2013   Fibrocystic breast 08/26/2013   HTN (hypertension) 08/26/2013   GERD (gastroesophageal reflux disease) 08/26/2013   S/P hysterectomy with oophorectomy 08/26/2013   Hypothyroidism 08/26/2013   Chronic hoarseness 08/26/2013   Colon polyp 08/26/2013   Hiatal hernia 08/26/2013   Irritable bowel syndrome 08/26/2013   Moderate persistent asthma 08/26/2013   Allergic rhinoconjunctivitis 08/26/2013   Hyperlipidemia 08/22/2013   Migraine 08/22/2013   Thyroid cancer 08/22/2013    PCP: [name], MD  REFERRING PROVIDER: Charlies Constable  MD  REFERRING DIAG: M17.9 (ICD-10-CM) - arthritis of knee  THERAPY DIAG:  Acute pain of right knee  Unsteadiness on feet  Muscle weakness (generalized)  Difficulty in walking, not elsewhere classified  Rationale for Evaluation and Treatment: Rehabilitation  ONSET DATE: chronic, worsened over last couple of months  SUBJECTIVE:   SUBJECTIVE STATEMENT:  "Really tired today"  She reports some days really sore, other days doing better.    PERTINENT HISTORY: asthma, hypertension, thyroid cancer, hypothyrodism, diabetes mellitus type 2, cervical radiculopathy, arthritis, history  LBP, R meniscal tears and OA, history of R knee surgery x 2  PAIN:  Are you having pain? Yes: NPRS scale: 0-2/10 Pain location: R lateral knee Pain description: sore, achey Aggravating factors: laying down, prolonged sitting, standing and walking, driving prolonged periods, stairs Relieving factors: tylenol, ibuprofen  PRECAUTIONS: Fall  WEIGHT BEARING RESTRICTIONS: No  FALLS:  Has patient fallen in last 6 months?  No but frequently stumbles, has almost fallen numerous times.   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   PLOF: Independent  PATIENT GOALS: get right leg stronger, improve balance, avoid TKA  NEXT MD VISIT: none currently schedule with orthopedist  OBJECTIVE:   DIAGNOSTIC FINDINGS: MR R knee 02/19/2022 IMPRESSION: 1. Attenuation of the posterior horn of the medial meniscus consistent with prior partial meniscectomy. Peripheral meniscal extrusion. No new meniscal tear. 2. Severe attenuation of the anterior horn and body of the lateral meniscus consistent with prior subtotal meniscectomy. Small oblique tear of the posterior horn of the lateral meniscus extending to the inferior articular surface. 3. Tricompartmental cartilage abnormalities as described above.  PATIENT SURVEYS:  LEFS 33/80 = 41.25%  COGNITION: Overall cognitive status: Within functional limits for tasks assessed     SENSATION: Slightly diminished R upper thigh L2 dermatome  EDEMA:  Circumferential: proximal knee 2 cm, distal knee 1 cm larger on R compared to left.   MUSCLE LENGTH:   POSTURE: rounded shoulders and forward head  PALPATION: Tenderness along R lateral joint line  LOWER EXTREMITY ROM:  Passive ROM Right eval  Left eval Right 10/03/22  Knee flexion 95 120 99  Knee extension 7 0 5   (Blank rows = not tested)  LOWER EXTREMITY MMT:  MMT Right eval Left eval  Hip flexion 3+ 4+  Hip extension    Hip abduction 5 5  Hip adduction 5 5  Knee flexion 4+p! 5  Knee extension 5p! 5  Ankle dorsiflexion 5 5  Ankle plantarflexion     (Blank rows = not tested)  LOWER EXTREMITY SPECIAL TESTS:  NT  FUNCTIONAL TESTS:  5 times sit to stand: 28 seconds with intermittant UE assist, noted R knee buckling 1x.  DGI 09/20/2022 - 18/24; 10/20/22- 19/24  GAIT: Distance walked: 45' Assistive device utilized: None Level of assistance: Complete Independence Comments: antalgic gait, lacking full R knee extension in terminal stance  10/20/22- improved TKE on R, poor eccentric control on R on stairs or step to gait.    [facility] PT Assessment - 10/20/22 0001       Standardized Balance Assessment   Standardized Balance Assessment Dynamic Gait Index      Dynamic Gait Index   Level Surface Normal    Change in Gait Speed Mild  Impairment    Gait with Horizontal Head Turns Mild Impairment    Gait with Vertical Head Turns Normal    Gait and Pivot Turn Normal    Step Over Obstacle Mild Impairment    Step Around Obstacles Normal    Steps Moderate Impairment    Total Score 19              TODAY'S TREATMENT:                                                                                                                              DATE:  10/20/22 Therapeutic Activity:  to assess progress towards goals. Nustep L5 x 10 min while discussing progress DGI LEFS 5xSTS ROM Ionotophoresis with 1.0 mL 4mg /ml Dexamethasone - 4-6 hour patch (69mA-min) to R lateral knee (patch #5 of 6).   10/13/22 Therapeutic Exercise: to improve strength and mobility.  Demo, verbal and tactile cues throughout for technique. Nustep L5 x 6 min  Step ups (6" step) 2 x 10 bil - cues to engage glutes with step up, reported decreased R knee pain.   Wall squats 10 x 5 sec hold.  Manual Therapy: to decrease muscle spasm and pain and improve mobility IASTM to R quads, STM/TPR to R knee, skilled palpation and monitoring during dry needling. Trigger Point Dry-Needling  Treatment instructions: Expect mild to moderate muscle soreness. S/S of pneumothorax if dry needled over a lung field, and to seek immediate medical attention should they occur. Patient verbalized understanding of these instructions and education. Patient Consent Given: Yes Education handout provided: Previously provided Muscles treated: R anterior tib Electrical stimulation performed: No Parameters: N/A Treatment response/outcome: Twitch Response Elicited and Palpable Increase in Muscle Length Ionotophoresis with 1.0 mL 4mg /ml Dexamethasone - 4-6 hour patch (93mA-min) to R lateral knee (patch #4 of 6).   10/10/22 Therapeutic Exercise: to improve strength and mobility.  Demo, verbal and tactile cues throughout for technique. Nustep L5x54min Seated LAQ GTB x 10 bil Seated HS curls GTB x 10 bil Bil UE support: Standing hip abduction GTB at knees x 10 bil Standing hip extension GTB at knees x 10 bil Standing marches GTB at knees x 10 bil Mini squats with GTB at knees no UE support 2x10  10/06/22 Therapeutic Exercise: to improve strength and mobility.  Demo, verbal and tactile cues throughout for technique. Nustep L5 x 6 min  Prone HS curls 4# 2x10 Step ups 4" 2x10 Lateral step ups 4' 2x10 Knee extension 10# 2x10 BLE  Manual Therapy: STM to R thoracic paraspinals and QL  Ionotophoresis with 1.0 mL 4mg /ml Dexamethasone - 4-6 hour patch (60mA-min) to R lateral knee (patch #3 of 6).   10/03/22 Therapeutic Exercise: to improve strength and mobility.  Demo, verbal and tactile cues throughout for technique. Nustep L5 x 6 min  Seated bil LAQ x 15 2# weight  Standing bil HS curl x 12 2# weight Wall squats 10x Toe raise  x 20 against wall Heel raise 2x10 at wall Step up R  LE 4' x 10  Lateral step up RLE 4' x 10 Supine L quad stretch with strap x 30 sec   PATIENT EDUCATION:  Education details: review of TrDN and ionto precautions and aftercare.  Person educated: Patient Education method: Explanation, Demonstration, Verbal cues, and Handouts Education comprehension: verbalized understanding and returned demonstration  HOME EXERCISE PROGRAM: Access Code: [email or web address] URL: [email or web address] Date: 10/10/2022 Prepared by: Clarene Essex  Exercises - Seated Quad Set  - 3 x daily - 7 x weekly - 1 sets - 10 reps - 5 sec hold - Supine Active Straight Leg Raise  - 1 x daily - 7 x weekly - 3 sets - 10 reps - Sidelying Hip Abduction  - 1 x daily - 7 x weekly - 3 sets - 10 reps - Prone Hip Extension  - 1 x daily - 7 x weekly - 3 sets - 10 reps - Wall Quarter Squat  - 1 x daily - 3-4 x weekly - 2 sets - 10 reps - Clam with Resistance  - 1 x daily - 3-4 x weekly - 2 sets - 10 reps - Seated Knee Extension with Resistance  - 1 x daily - 3-4 x weekly - 3 sets - 10 reps - Seated Knee Flexion with Anchored Resistance  - 1 x daily - 3-4 x weekly - 3 sets - 10 reps  ASSESSMENT:  CLINICAL IMPRESSION: LEOSHA VAQUERANO still reports R knee pain but overall 40% improvement.  Having more posterior knee pain, believes baker's cyst is refilling, will make appt. With orthopedist next week to see if can be drained.  Also discussed bracing, due to clothing was primarily interested in compression sleeve type bracing, but there are inexpensive options available on [place].  She demonstrates improved gait, less antalgic now, her R knee extension has improved but flexion is worse, again limited possibly by baker's cyst, her functional strength has improved meeting LTG #4.  Based on progress but continued deficits, recommend continued skilled therapy 1-2x/week for additional 4 weeks.    PORTER PLINER continues to demonstrate potential for improvement and would benefit from  continued skilled therapy to address impairments.         OBJECTIVE IMPAIRMENTS: Abnormal gait, decreased activity tolerance, decreased balance, decreased endurance, decreased mobility, difficulty walking, decreased ROM, decreased strength, increased edema, increased fascial restrictions, increased muscle spasms, impaired flexibility, impaired sensation, and pain.   ACTIVITY LIMITATIONS: carrying, lifting, bending, sitting, standing, squatting, sleeping, stairs, transfers, bed mobility, locomotion level, and caring for others  PARTICIPATION LIMITATIONS: cleaning, driving, shopping, community activity, and occupation  PERSONAL FACTORS: Age, Time since onset of injury/illness/exacerbation, and 3+ comorbidities: asthma, hypertension, thyroid cancer, hypothyrodism, diabetes mellitus type 2, cervical radiculopathy, arthritis, history LBP, R meniscal tears and OA    are also affecting patient's functional outcome.   REHAB POTENTIAL: Good  CLINICAL DECISION MAKING: Evolving/moderate complexity  EVALUATION COMPLEXITY: Moderate   GOALS: Goals reviewed with patient? Yes  SHORT TERM GOALS: Target date: 09/28/2022   Patient will be independent with initial HEP. Baseline: given Goal status: MET 10/10/22   LONG TERM GOALS: Target date: 10/26/2022 extended to 11/17/22  Patient will be independent with advanced/ongoing HEP to improve outcomes and carryover.  Baseline:  Goal status: IN PROGRESS 10/20/22- met for current  2.  Patient will report at least 75% improvement in Right knee pain to improve QOL. Baseline:  Goal status: IN PROGRESS 10/20/22- 40%  improvement on good days.   3.  Patient will demonstrate improved R knee AROM to >/= 0-110 deg to allow for normal gait and stair mechanics. Baseline: 7-95 Goal status: IN PROGRESS- 10/03/22 (5-99)  10/20/22 2-90  4.  Patient will demonstrate improved functional LE strength as demonstrated by improved 5x STS to < 20 sec. Baseline: 28 seconds Goal  status: MET 10/20/22 - 16.8 seconds   5.  Patient will be able to ambulate 600' with LRAD and normal gait pattern without increased pain to access community.  Baseline: antalgic gait, visually slow gait speed Goal status: IN PROGRESS 10/20/22 - improving, not antalgic over short distances  6. Patient will be able to ascend/descend stairs with 1 HR and reciprocal step pattern safely to access home and community.  Baseline: painful descending stairs Goal status: IN PROGRESS 10/20/22- improving but still uses step to gait descending, too much pressure on R knee to control otherwise.   7.  Patient will report >45/80 on LEFS  to demonstrate improved functional ability. Baseline: 33/80 Goal status: IN PROGRESS 10/20/22- improving, 40/80  8.  Patient will demonstrate at least 19/24 on DGI to decrease risk of falls. Baseline: 18/24 Goal status: MET 10/20/22- 19/24     PLAN:  PT FREQUENCY: 1-2x/week  PT DURATION: 4 weeks  PLANNED INTERVENTIONS: Therapeutic exercises, Therapeutic activity, Neuromuscular re-education, Balance training, Gait training, Patient/Family education, Self Care, Joint mobilization, Stair training, Orthotic/Fit training, Dry Needling, Electrical stimulation, Cryotherapy, Moist heat, Taping, Vasopneumatic device, Ionotophoresis 4mg /ml Dexamethasone, Manual therapy, and Re-evaluation  PLAN FOR NEXT SESSION: continue ionto; progress HEP with focus on knee strengthening, manual therapy and modalities PRN   Rennie Natter, PT, DPT  10/20/2022, 6:06 PM

## 2022-10-27 ENCOUNTER — Ambulatory Visit: Payer: Medicare Other

## 2022-10-27 DIAGNOSIS — R262 Difficulty in walking, not elsewhere classified: Secondary | ICD-10-CM

## 2022-10-27 DIAGNOSIS — M6281 Muscle weakness (generalized): Secondary | ICD-10-CM | POA: Diagnosis not present

## 2022-10-27 DIAGNOSIS — R2681 Unsteadiness on feet: Secondary | ICD-10-CM | POA: Diagnosis not present

## 2022-10-27 DIAGNOSIS — M25561 Pain in right knee: Secondary | ICD-10-CM | POA: Diagnosis not present

## 2022-10-27 NOTE — Therapy (Signed)
OUTPATIENT PHYSICAL THERAPY TREATMENT     Patient Name: Monica FlakeWilma R Wallace MRN: [id number] DOB:05/20/1951, 72 y.o., female Today's Date: 10/27/2022  END OF SESSION:  PT End of Session - 10/27/22 1753     Visit Number 10    Number of Visits 12    Date for PT Re-Evaluation 11/17/22    Authorization Type Medicare & [employer]    Progress Note Due on Visit --   progress note completed visit 9   PT Start Time 1704    PT Stop Time 1745    PT Time Calculation (min) 41 min    Activity Tolerance Patient tolerated treatment well    Behavior During Therapy WFL for tasks assessed/performed                 Past Medical History:  Diagnosis Date   Arthritis    Asthma    Baker's cyst of knee, right 02/16/2017   Cancer 2002   thyroid (2002)   Cystic breast    Diabetes    GERD (gastroesophageal reflux disease)    Hepatic cyst 02/02/2015   Hyperlipidemia    Hypertension    Insomnia 07/23/2016   Menopause    Migraine    Palpitations    Preventative health care 01/18/2016   Sleep apnea 02/02/2015   Thyroid disease    Past Surgical History:  Procedure Laterality Date   ABDOMINAL HYSTERECTOMY     took right ovary and uterus   APPENDECTOMY     CARPAL TUNNEL RELEASE Right    CESAREAN SECTION     CHOLECYSTECTOMY     EYE SURGERY Bilateral 2017   cataracts   KNEE ARTHROPLASTY     SHOULDER ARTHROSCOPY W/ ROTATOR CUFF REPAIR Left    THYROIDECTOMY  2002   Patient Active Problem List   Diagnosis Date Noted   Mild persistent asthma without complication 04/22/2022   Dysfunction of both eustachian tubes 04/22/2022   UTI symptoms 04/07/2022   Hx of papillary thyroid carcinoma 02/28/2022   Postoperative hypothyroidism 02/28/2022   Left ankle pain 02/09/2022   Dizziness 02/08/2022   Edema 02/08/2022   Unsteady gait 11/14/2021   Lumbar radiculopathy 09/09/2021   Cervical cancer screening 03/07/2021   Sinusitis 10/30/2020   Allergic conjunctivitis 04/08/2020   Neck pain 02/06/2020    Right knee pain 09/30/2019   COVID-19 09/30/2019   Educated about COVID-19 virus infection 12/05/2018   Baker's cyst of knee, right 02/16/2017   Insomnia 07/23/2016   Hypokalemia 07/23/2016   Controlled type 2 diabetes mellitus without complication, without long-term current use of insulin 01/24/2016   Preventative health care 01/18/2016   Left-sided low back pain with left-sided sciatica 10/07/2015   Obesity (BMI 30-39.9) 06/05/2015   OSA (obstructive sleep apnea) 02/02/2015   Palpitations 09/19/2014   Plantar fasciitis, bilateral 11/19/2013   Pronation deformity of ankle, acquired 11/19/2013   DJD (degenerative joint disease) 08/26/2013   Fibrocystic breast 08/26/2013   HTN (hypertension) 08/26/2013   GERD (gastroesophageal reflux disease) 08/26/2013   S/P hysterectomy with oophorectomy 08/26/2013   Hypothyroidism 08/26/2013   Chronic hoarseness 08/26/2013   Colon polyp 08/26/2013   Hiatal hernia 08/26/2013   Irritable bowel syndrome 08/26/2013   Moderate persistent asthma 08/26/2013   Allergic rhinoconjunctivitis 08/26/2013   Hyperlipidemia 08/22/2013   Migraine 08/22/2013   Thyroid cancer 08/22/2013    PCP: Bradd CanaryBlyth, Stacey A, MD  REFERRING PROVIDER: Weber CooksMarchwiany, Daniel MD  REFERRING DIAG: M17.9 (ICD-10-CM) - arthritis of knee  THERAPY DIAG:  Acute pain of  right knee  Unsteadiness on feet  Muscle weakness (generalized)  Difficulty in walking, not elsewhere classified  Rationale for Evaluation and Treatment: Rehabilitation  ONSET DATE: chronic, worsened over last couple of months  SUBJECTIVE:   SUBJECTIVE STATEMENT:  Had an injection earlier today they also drew fluid out of her R knee so she has a lot of pain today. She needs to make a separate apt to get her baker cyst drained.    PERTINENT HISTORY: asthma, hypertension, thyroid cancer, hypothyrodism, diabetes mellitus type 2, cervical radiculopathy, arthritis, history LBP, R meniscal tears and OA, history of  R knee surgery x 2  PAIN:  Are you having pain? Yes: NPRS scale: 10/10 Pain location: R lateral knee Pain description: sore, achey Aggravating factors: laying down, prolonged sitting, standing and walking, driving prolonged periods, stairs Relieving factors: tylenol, ibuprofen  PRECAUTIONS: Fall  WEIGHT BEARING RESTRICTIONS: No  FALLS:  Has patient fallen in last 6 months?  No but frequently stumbles, has almost fallen numerous times.   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   PLOF: Independent  PATIENT GOALS: get right leg stronger, improve balance, avoid TKA  NEXT MD VISIT: none currently schedule with orthopedist  OBJECTIVE:   DIAGNOSTIC FINDINGS: MR R knee 02/19/2022 IMPRESSION: 1. Attenuation of the posterior horn of the medial meniscus consistent with prior partial meniscectomy. Peripheral meniscal extrusion. No new meniscal tear. 2. Severe attenuation of the anterior horn and body of the lateral meniscus consistent with prior subtotal meniscectomy. Small oblique tear of the posterior horn of the lateral meniscus extending to the inferior articular surface. 3. Tricompartmental cartilage abnormalities as described above.  PATIENT SURVEYS:  LEFS 33/80 = 41.25%  COGNITION: Overall cognitive status: Within functional limits for tasks assessed     SENSATION: Slightly diminished R upper thigh L2 dermatome  EDEMA:  Circumferential: proximal knee 2 cm, distal knee 1 cm larger on R compared to left.   MUSCLE LENGTH:   POSTURE: rounded shoulders and forward head  PALPATION: Tenderness along R lateral joint line  LOWER EXTREMITY ROM:  Passive ROM Right eval Left eval Right 10/03/22  Knee flexion  95 120 99  Knee extension 7 0 5   (Blank rows = not tested)  LOWER EXTREMITY MMT:  MMT Right eval Left eval  Hip flexion 3+ 4+  Hip extension    Hip abduction 5 5  Hip adduction 5 5  Knee flexion 4+p! 5  Knee extension 5p! 5  Ankle dorsiflexion 5 5  Ankle plantarflexion     (Blank rows = not tested)  LOWER EXTREMITY SPECIAL TESTS:  NT  FUNCTIONAL TESTS:  5 times sit to stand: 28 seconds with intermittant UE assist, noted R knee buckling 1x.  DGI 09/20/2022 - 18/24; 10/20/22- 19/24  GAIT: Distance walked: 56' Assistive device utilized: None Level of assistance: Complete Independence Comments: antalgic gait, lacking full R knee extension in terminal stance  10/20/22- improved TKE on R, poor eccentric control on R on stairs or step to gait.       TODAY'S TREATMENT:  DATE:  10/27/22 Nustep L3x72min Seated ball squeeze btw ankles 2x10 Seated isometric hip ABD with gait belt 2x10 Supine SLR x 10 R/L Supine R heel slides 2x10 S/L hip abduction 2x10 R Manual resisted clamshells S/L R 2x10 Supine QS 2x10 - 3 sec hold  10/20/22 Therapeutic Activity:  to assess progress towards goals. Nustep L5 x 10 min while discussing progress DGI LEFS 5xSTS ROM Ionotophoresis with 1.0 mL 4mg /ml Dexamethasone - 4-6 hour patch (20mA-min) to R lateral knee (patch #5 of 6).   10/13/22 Therapeutic Exercise: to improve strength and mobility.  Demo, verbal and tactile cues throughout for technique. Nustep L5 x 6 min  Step ups (6" step) 2 x 10 bil - cues to engage glutes with step up, reported decreased R knee pain.  Wall squats 10 x 5 sec hold.  Manual Therapy: to decrease muscle spasm and pain and improve mobility IASTM to R quads, STM/TPR to R knee, skilled palpation and monitoring during dry needling. Trigger Point Dry-Needling  Treatment instructions: Expect mild to  moderate muscle soreness. S/S of pneumothorax if dry needled over a lung field, and to seek immediate medical attention should they occur. Patient verbalized understanding of these instructions and education. Patient Consent Given: Yes Education handout provided: Previously provided Muscles treated: R anterior tib Electrical stimulation performed: No Parameters: N/A Treatment response/outcome: Twitch Response Elicited and Palpable Increase in Muscle Length Ionotophoresis with 1.0 mL 4mg /ml Dexamethasone - 4-6 hour patch (12mA-min) to R lateral knee (patch #4 of 6).   10/10/22 Therapeutic Exercise: to improve strength and mobility.  Demo, verbal and tactile cues throughout for technique. Nustep L5x37min Seated LAQ GTB x 10 bil Seated HS curls GTB x 10 bil Bil UE support: Standing hip abduction GTB at knees x 10 bil Standing hip extension GTB at knees x 10 bil Standing marches GTB at knees x 10 bil Mini squats with GTB at knees no UE support 2x10  10/06/22 Therapeutic Exercise: to improve strength and mobility.  Demo, verbal and tactile cues throughout for technique. Nustep L5 x 6 min  Prone HS curls 4# 2x10 Step ups 4" 2x10 Lateral step ups 4' 2x10 Knee extension 10# 2x10 BLE  Manual Therapy: STM to R thoracic paraspinals and QL  Ionotophoresis with 1.0 mL 4mg /ml Dexamethasone - 4-6 hour patch (48mA-min) to R lateral knee (patch #3 of 6).   10/03/22 Therapeutic Exercise: to improve strength and mobility.  Demo, verbal and tactile cues throughout for technique. Nustep L5 x 6 min  Seated bil LAQ x 15 2# weight  Standing bil HS curl x 12 2# weight Wall squats 10x Toe raise x 20 against wall Heel raise 2x10 at wall Step up R LE 4' x 10  Lateral step up RLE 4' x 10 Supine L quad stretch with strap x 30 sec   PATIENT EDUCATION:  Education details: review of TrDN and ionto precautions and aftercare.  Person educated: Patient Education method: Explanation, Demonstration, Verbal  cues, and Handouts Education comprehension: verbalized understanding and returned demonstration  HOME EXERCISE PROGRAM: Access Code: [email or web address] URL: [email or web address] Date: 10/10/2022 Prepared by: Verta Ellen  Exercises - Seated Quad Set  - 3 x daily - 7 x weekly - 1 sets - 10 reps - 5 sec hold - Supine Active Straight Leg Raise  - 1 x daily - 7 x weekly - 3 sets - 10 reps - Sidelying Hip Abduction  - 1 x daily - 7 x weekly - 3 sets - 10 reps -  Prone Hip Extension  - 1 x daily - 7 x weekly - 3 sets - 10 reps - Wall Quarter Squat  - 1 x daily - 3-4 x weekly - 2 sets - 10 reps - Clam with Resistance  - 1 x daily - 3-4 x weekly - 2 sets - 10 reps - Seated Knee Extension with Resistance  - 1 x daily - 3-4 x weekly - 3 sets - 10 reps - Seated Knee Flexion with Anchored Resistance  - 1 x daily - 3-4 x weekly - 3 sets - 10 reps  ASSESSMENT:  CLINICAL IMPRESSION: Monica Wallace received an injection for her R knee and had fluid drawn from her R knee. She noted that she would have to schedule appointment with another doc to drain the baker cyst. She was in a lot of pain today so we toned down the exercises. She was able to progressed through the exercises well. Provided cues and instruction throughout session as needed.       OBJECTIVE IMPAIRMENTS: Abnormal gait, decreased activity tolerance, decreased balance, decreased endurance, decreased mobility, difficulty walking, decreased ROM, decreased strength, increased edema, increased fascial restrictions, increased muscle spasms, impaired flexibility, impaired sensation, and pain.   ACTIVITY LIMITATIONS: carrying, lifting, bending, sitting, standing, squatting, sleeping, stairs, transfers, bed mobility, locomotion level, and caring for others  PARTICIPATION LIMITATIONS: cleaning, driving, shopping, community activity, and occupation  PERSONAL FACTORS: Age, Time since onset of injury/illness/exacerbation, and 3+ comorbidities:  asthma, hypertension, thyroid cancer, hypothyrodism, diabetes mellitus type 2, cervical radiculopathy, arthritis, history LBP, R meniscal tears and OA    are also affecting patient's functional outcome.   REHAB POTENTIAL: Good  CLINICAL DECISION MAKING: Evolving/moderate complexity  EVALUATION COMPLEXITY: Moderate   GOALS: Goals reviewed with patient? Yes  SHORT TERM GOALS: Target date: 09/28/2022   Patient will be independent with initial HEP. Baseline: given Goal status: MET 10/10/22   LONG TERM GOALS: Target date: 10/26/2022 extended to 11/17/22  Patient will be independent with advanced/ongoing HEP to improve outcomes and carryover.  Baseline:  Goal status: IN PROGRESS 10/20/22- met for current  2.  Patient will report at least 75% improvement in Right knee pain to improve QOL. Baseline:  Goal status: IN PROGRESS 10/20/22- 40% improvement on good days.   3.  Patient will demonstrate improved R knee AROM to >/= 0-110 deg to allow for normal gait and stair mechanics. Baseline: 7-95 Goal status: IN PROGRESS- 10/03/22 (5-99)  10/20/22 2-90  4.  Patient will demonstrate improved functional LE strength as demonstrated by improved 5x STS to < 20 sec. Baseline: 28 seconds Goal status: MET 10/20/22 - 16.8 seconds   5.  Patient will be able to ambulate 600' with LRAD and normal gait pattern without increased pain to access community.  Baseline: antalgic gait, visually slow gait speed Goal status: IN PROGRESS 10/20/22 - improving, not antalgic over short distances  6. Patient will be able to ascend/descend stairs with 1 HR and reciprocal step pattern safely to access home and community.  Baseline: painful descending stairs Goal status: IN PROGRESS 10/20/22- improving but still uses step to gait descending, too much pressure on R knee to control otherwise.   7.  Patient will report >45/80 on LEFS  to demonstrate improved functional ability. Baseline: 33/80 Goal status: IN PROGRESS 10/20/22-  improving, 40/80  8.  Patient will demonstrate at least 19/24 on DGI to decrease risk of falls. Baseline: 18/24 Goal status: MET 10/20/22- 19/24     PLAN:  PT FREQUENCY: 1-2x/week  PT DURATION: 4 weeks  PLANNED INTERVENTIONS: Therapeutic exercises, Therapeutic activity, Neuromuscular re-education, Balance training, Gait training, Patient/Family education, Self Care, Joint mobilization, Stair training, Orthotic/Fit training, Dry Needling, Electrical stimulation, Cryotherapy, Moist heat, Taping, Vasopneumatic device, Ionotophoresis 4mg /ml Dexamethasone, Manual therapy, and Re-evaluation  PLAN FOR NEXT SESSION: continue ionto; progress HEP with focus on knee strengthening, manual therapy and modalities PRN   Darleene Cleaver, PTA 10/27/2022, 5:59 PM

## 2022-10-31 ENCOUNTER — Ambulatory Visit: Payer: Medicare Other | Admitting: Physical Therapy

## 2022-11-01 NOTE — Progress Notes (Unsigned)
FOLLOW UP Date of Service/Encounter:  11/03/22   Subjective:  Monica Wallace (DOB: Sep 18, 1950) is a 72 y.o. female who [facility] in re-evaluation of the following: moderate persistent asthma, allergic rhinitis, allergic conjunctivitis, and recurrent sinus infections.  History obtained from: chart review and patient.  For Review, LV was on 04/22/22  with Dr.Elchanan Bob seen for routine follow-up. See below for summary of history and diagnostics.  Therapeutic plans/changes recommended: FEV1 80%, having an asthma flare. We discussed using saline rinses followed by azelastine and flonase, atrovent PRN, pataday PRN, xyzal daily. We prescribed prednisone and recommend follow-up with Dr. Suszanne Conners as she reported some vertigo.   Pertinent History/Diagnostics:  Asthma: Restrictive lung pattern. Follows with pulmonary. Controlled on pulmicort flexhaler. Allergic Rhinitis:  Previous sinus surgery with Dr.Teoh. Has reflux following with GI. Has hx of ETD. - SPT environmental panel (2021): intradermal's mildly reactive to grass pollen, tree pollen, mold mix #4, and dust mite  ---------------------------- Today presents for follow-up. She has a sinus infection. Over the weekend was in the bed due to fatigue and congestion. She is blowing out copious white/clear liquid.  She does feels pressure in her forehead.  She now has a deep cough.  She has noticed a spring allergy flare with symptoms of increased PND and some rhinorrhea, but 8 days ago, symptoms got much worse. She has been having her usual itchy throat and nose until worsened a few days ago. She has been taking cold medicine with decongestant. She hasn't had a fever.  She does feel ear fullness with some dizziness. She is coughing up a clear to light yellow phlegm.  She is using pulmicot 2 puffs daily, she is using her azelastine, fluticasone and atrovent daily.  Dr. Suszanne Conners told her not to use the atrovent all  the time which she had not been doing, but she is using it more frequently now due to allergy symptoms.  The Atrovent can cause her nasal passages to become overly dried if she overuses. She is no longer following with pulmonary, was told to return if having problems.  She is using her albuterol some this week. She thinks she has used it 2 days this week. She hadn't used it at all prior to this.  No steroids or antibiotics since last visit.    Allergies as of 11/03/2022       Reactions   Levaquin [levofloxacin] Shortness Of Breath   Clindamycin/lincomycin Other (See Comments)   Pruritus No rash   Codeine Itching   Gabapentin Itching        Medication List        Accurate as of November 03, 2022  4:25 PM. If you have any questions, ask your nurse or doctor.          acetaminophen 325 MG tablet Commonly known as: TYLENOL Take 650 mg by mouth every 6 (six) hours as needed.   albuterol (2.5 MG/3ML) 0.083% nebulizer solution Commonly known as: PROVENTIL Take 3 mLs (2.5 mg total) by nebulization every 6 (six) hours as needed for wheezing or shortness of breath.   albuterol 108 (90 Base) MCG/ACT inhaler Commonly known as: VENTOLIN HFA Inhale 2 puffs into the lungs every 6 (six) hours as needed for wheezing or shortness of breath.   amoxicillin-clavulanate 875-125 MG tablet Commonly known as: AUGMENTIN Take 1 tablet by mouth 2 (two) times daily for 10 days. Started by: Verlee Monte, MD   azelastine 0.1 %  nasal spray Commonly known as: ASTELIN 1-2 sprays per nostril twice daily for runny nose   benzonatate 100 MG capsule Commonly known as: Tessalon Perles Take 2 capsules (200 mg total) by mouth 3 (three) times daily as needed for cough.   [employer] VIT D 5000 HIGH-POTENCY PO Take 5,000 Units by mouth daily.   cyclobenzaprine 5 MG tablet Commonly known as: FLEXERIL Take 1 tablet (5 mg total) by mouth 3 (three) times daily as needed for muscle spasms.   estradiol 0.1 MG/GM  vaginal cream Commonly known as: ESTRACE VAGINAL Place 1 Applicatorful vaginally at bedtime.   famotidine 40 MG tablet Commonly known as: PEPCID Take 1 tablet (40 mg total) by mouth at bedtime.   fluticasone 50 MCG/ACT nasal spray Commonly known as: FLONASE Place 2 sprays into both nostrils daily.   furosemide 20 MG tablet Commonly known as: LASIX 1 tab by mouth daily as needed for edema, weight gain >3lbs in 24 hours   hyoscyamine 0.125 MG SL tablet Commonly known as: LEVSIN SL DISSOLVE 1 TABLET(0.125 MG) UNDER THE TONGUE EVERY 4 HOURS AS NEEDED   ipratropium 0.03 % nasal spray Commonly known as: ATROVENT Place 2 sprays into both nostrils 2 (two) times daily as needed for rhinitis. Use less frequently if you become too dry.   levocetirizine 5 MG tablet Commonly known as: XYZAL TAKE 1 TABLET(5 MG) BY MOUTH EVERY EVENING   montelukast 10 MG tablet Commonly known as: SINGULAIR TAKE 1 TABLET(10 MG) BY MOUTH AT BEDTIME   nystatin cream Commonly known as: MYCOSTATIN Apply 1 Application topically 2 (two) times daily.   Olopatadine HCl 0.2 % Soln Place 1 drop in each eye once a day as needed for itchy watery eyes   Omega-3 1000 MG Caps Take 1,000 mg by mouth daily.   omeprazole 40 MG capsule Commonly known as: PRILOSEC Take 1 capsule (40 mg total) by mouth daily as needed (reflux).   OneTouch Delica Plus Lancet33G Misc TEST BLOOD SUGAR ONCE A DAY   OneTouch Verio Flex System w/Device Kit Use to check blood glucose once a day.  DX code: E11.9   OneTouch Verio test strip Generic drug: glucose blood TEST BLOOD GLUCOSE ONCE A DAY   potassium chloride SA 20 MEQ tablet Commonly known as: KLOR-CON M TAKE 2 TABLETS BY MOUTH DAILY FOR 3 DAYS. THEN TAKE 1 TABLET BY MOUTH DAILY.   predniSONE 20 MG tablet Commonly known as: DELTASONE Take 2 tablets (40 mg total) by mouth daily with breakfast for 4 days. Start taking on: November 04, 2022 Started by: Verlee Monte, MD    Pulmicort Flexhaler 180 MCG/ACT inhaler Generic drug: budesonide 2 puffs twice daily to prevent coughing or wheezing.   rosuvastatin 5 MG tablet Commonly known as: CRESTOR TAKE 1 TABLET TWICE A WEEK   Synthroid 137 MCG tablet Generic drug: levothyroxine Take 1 tablet (137 mcg total) by mouth daily before breakfast.   triamterene-hydrochlorothiazide 37.5-25 MG tablet Commonly known as: MAXZIDE-25 TAKE 1 TABLET BY MOUTH DAILY       Past Medical History:  Diagnosis Date   Arthritis    Asthma    Baker's cyst of knee, right 02/16/2017   Cancer 2002   thyroid (2002)   Cystic breast    Diabetes    GERD (gastroesophageal reflux disease)    Hepatic cyst 02/02/2015   Hyperlipidemia    Hypertension    Insomnia 07/23/2016   Menopause    Migraine    Palpitations    Preventative  health care 01/18/2016   Sleep apnea 02/02/2015   Thyroid disease    Past Surgical History:  Procedure Laterality Date   ABDOMINAL HYSTERECTOMY     took right ovary and uterus   APPENDECTOMY     CARPAL TUNNEL RELEASE Right    CESAREAN SECTION     CHOLECYSTECTOMY     EYE SURGERY Bilateral 2017   cataracts   KNEE ARTHROPLASTY     SHOULDER ARTHROSCOPY W/ ROTATOR CUFF REPAIR Left    THYROIDECTOMY  2002   Otherwise, there have been no changes to her past medical history, surgical history, family history, or social history.  ROS: All others negative except as noted per HPI.   Objective:  BP 136/82   Pulse 66   Temp 97.9 F (36.6 C) (Temporal)   Resp 16   SpO2 98%  There is no height or weight on file to calculate BMI. Physical Exam: General Appearance:  Alert, cooperative, no distress, appears stated age  Head:  Normocephalic, without obvious abnormality, atraumatic  Eyes:  Conjunctiva clear, EOM's intact  Nose: Nares normal,  clear rhinorrhea present bilaterally, hypertrophic turbinates, normal mucosa, and no visible anterior polyps  Throat: Lips, tongue normal; teeth and gums normal,  normal posterior oropharynx and + cobblestoning  Neck: Supple, symmetrical  Lungs:   end-expiratory wheezing, Respirations unlabored, no coughing  Heart:  regular rate and rhythm and no murmur, Appears well perfused  Extremities: No edema  Skin: Skin color, texture, turgor normal and no rashes or lesions on visualized portions of skin  Neurologic: No gross deficits  Spirometry:  Tracings reviewed. Her effort: Good reproducible efforts. FVC: 1.62L FEV1: 1.41L, 83% predicted FEV1/FVC ratio: 0.87 Interpretation: Spirometry consistent with normal pattern.  Please see scanned spirometry results for details.  Assessment/Plan   Mild persistent asthma with acute flare due to sinusitis - 40 mg IM depo - tomorrow start prednisone 40 mg daily for the next 4 days - start Augmentin 875 mg twice daily for 7-10 days. Take with probiotics or live culture yogurt.  Continue Pulmicort Flexhaler  180 mcg 2 puffs once a day to help prevent cough and wheeze. Increase to 2 puffs twice daily during asthma flares.  Continue montelukast (Singulair) 10 mg once a day to help prevent cough and wheeze May use albuterol 2 puffs every 4-6 hours as needed for cough, wheeze, tightness in chest, shortness of breath.  Also may use albuterol 2 puffs 5 to 15 minutes prior to exercise Continue to follow up with pulmonology   Allergic rhinoconjunctivitis (2021 skin testing shows intradermal's mildly reactive to grass pollen, tree pollen, mold mix #4, and dust mite) with eustachian tube dysfunction: uncontrolled  For nasal symptoms:  First use sinus rinse-use twice daily. Continue saline gel for nasal dryness Next use medicated nasal sprays: Continue azelastine nasal spray 1-2 spray each nostril twice a day as needed for runny nose/stuffy nose/drainage down throat. Caution as this can be drying Continue fluticasone 1-2 sprays in each nostril once a day as needed for stuffy nose Atrovent 1-2 sprays in each nostril twice  daily as needed. for post nasal drainage, and runny nose. If nose becomes too dry, please use less frequently May use Pataday 1 drop each eye once a day as needed for itchy watery eyes.  Xyzal (levocetirizine) 5 mg once a day as needed for runny nose   Reflux-stable Continue omeprazole 40 mg once a day to help prevent cough and wheeze Continue follow-up with GI  Please let us  know if this treatment plan is not working well for you Schedule a follow-up appointment in 6 months or sooner if needed  Tonny Bollman, MD  [facility]

## 2022-11-03 ENCOUNTER — Encounter: Payer: Self-pay | Admitting: Internal Medicine

## 2022-11-03 ENCOUNTER — Ambulatory Visit (INDEPENDENT_AMBULATORY_CARE_PROVIDER_SITE_OTHER): Payer: Medicare Other | Admitting: Internal Medicine

## 2022-11-03 VITALS — BP 136/82 | HR 66 | Temp 97.9°F | Resp 16

## 2022-11-03 DIAGNOSIS — K219 Gastro-esophageal reflux disease without esophagitis: Secondary | ICD-10-CM

## 2022-11-03 DIAGNOSIS — J0110 Acute frontal sinusitis, unspecified: Secondary | ICD-10-CM | POA: Diagnosis not present

## 2022-11-03 DIAGNOSIS — J4530 Mild persistent asthma, uncomplicated: Secondary | ICD-10-CM

## 2022-11-03 DIAGNOSIS — J3089 Other allergic rhinitis: Secondary | ICD-10-CM | POA: Diagnosis not present

## 2022-11-03 MED ORDER — OMEPRAZOLE 40 MG PO CPDR: 40.0000 mg | DELAYED_RELEASE_CAPSULE | Freq: Every day | ORAL | 5 refills | Status: DC | PRN

## 2022-11-03 MED ORDER — PREDNISONE 20 MG PO TABS: 40.0000 mg | ORAL_TABLET | Freq: Every day | ORAL | 0 refills | Status: AC

## 2022-11-03 MED ORDER — IPRATROPIUM BROMIDE 0.03 % NA SOLN: 2.0000 | Freq: Two times a day (BID) | NASAL | 4 refills | Status: DC | PRN

## 2022-11-03 MED ORDER — ALBUTEROL SULFATE HFA 108 (90 BASE) MCG/ACT IN AERS: 2.0000 | INHALATION_SPRAY | Freq: Four times a day (QID) | RESPIRATORY_TRACT | 1 refills | Status: DC | PRN

## 2022-11-03 MED ORDER — LEVOCETIRIZINE DIHYDROCHLORIDE 5 MG PO TABS: ORAL_TABLET | ORAL | 5 refills | Status: DC

## 2022-11-03 MED ORDER — AMOXICILLIN-POT CLAVULANATE 875-125 MG PO TABS: 1.0000 | ORAL_TABLET | Freq: Two times a day (BID) | ORAL | 0 refills | Status: AC

## 2022-11-03 MED ORDER — FLUTICASONE PROPIONATE 50 MCG/ACT NA SUSP: 2.0000 | Freq: Every day | NASAL | 5 refills | Status: DC

## 2022-11-03 MED ORDER — ALBUTEROL SULFATE (2.5 MG/3ML) 0.083% IN NEBU: 2.5000 mg | INHALATION_SOLUTION | Freq: Four times a day (QID) | RESPIRATORY_TRACT | 0 refills | Status: DC | PRN

## 2022-11-03 MED ORDER — MONTELUKAST SODIUM 10 MG PO TABS: ORAL_TABLET | ORAL | 1 refills | Status: DC

## 2022-11-03 MED ORDER — METHYLPREDNISOLONE ACETATE 40 MG/ML IJ SUSP
40.0000 mg | Freq: Once | INTRAMUSCULAR | Status: AC
  Administered 2022-11-03: 40 mg via INTRAMUSCULAR

## 2022-11-03 MED ORDER — OLOPATADINE HCL 0.2 % OP SOLN: OPHTHALMIC | 5 refills | Status: AC

## 2022-11-03 MED ORDER — PULMICORT FLEXHALER 180 MCG/ACT IN AEPB: INHALATION_SPRAY | RESPIRATORY_TRACT | 5 refills | Status: DC

## 2022-11-03 MED ORDER — AZELASTINE HCL 0.1 % NA SOLN: NASAL | 5 refills | Status: DC

## 2022-11-03 NOTE — Addendum Note (Signed)
Addended by: Berna Bue on: 11/03/2022 04:57 PM   Modules accepted: Orders

## 2022-11-03 NOTE — Patient Instructions (Addendum)
Mild persistent asthma with acute flare due to sinusitis - 40 mg IM depo - tomorrow start prednisone 40 mg daily for the next 4 days - start Augmentin 875 mg twice daily for 7-10 days. Take with probiotics or live culture yogurt.  Continue Pulmicort Flexhaler  180 mcg 2 puffs once a day to help prevent cough and wheeze. Increase to 2 puffs twice daily during asthma flares.  Continue montelukast (Singulair) 10 mg once a day to help prevent cough and wheeze May use albuterol 2 puffs every 4-6 hours as needed for cough, wheeze, tightness in chest, shortness of breath.  Also may use albuterol 2 puffs 5 to 15 minutes prior to exercise Continue to follow up with pulmonology   Allergic rhinoconjunctivitis (2021 skin testing shows intradermal's mildly reactive to grass pollen, tree pollen, mold mix #4, and dust mite) with eustachian tube dysfunction  For nasal symptoms:  First use sinus rinse-use twice daily. Continue saline gel for nasal dryness Next use medicated nasal sprays: Continue azelastine nasal spray 1-2 spray each nostril twice a day as needed for runny nose/stuffy nose/drainage down throat. Caution as this can be drying Continue fluticasone 1-2 sprays in each nostril once a day as needed for stuffy nose Atrovent 1-2 sprays in each nostril twice daily as needed. for post nasal drainage, and runny nose. If nose becomes too dry, please use less frequently May use Pataday 1 drop each eye once a day as needed for itchy watery eyes.  Xyzal (levocetirizine) 5 mg once a day as needed for runny nose   Reflux Continue omeprazole 40 mg once a day to help prevent cough and wheeze Continue follow-up with GI  Please let us know if this treatment plan is not working well for you Schedule a follow-up appointment in 6 months or sooner if needed

## 2022-11-08 ENCOUNTER — Ambulatory Visit: Payer: Medicare Other

## 2022-11-08 DIAGNOSIS — M6281 Muscle weakness (generalized): Secondary | ICD-10-CM | POA: Diagnosis not present

## 2022-11-08 DIAGNOSIS — R2681 Unsteadiness on feet: Secondary | ICD-10-CM

## 2022-11-08 DIAGNOSIS — M25561 Pain in right knee: Secondary | ICD-10-CM

## 2022-11-08 DIAGNOSIS — R262 Difficulty in walking, not elsewhere classified: Secondary | ICD-10-CM | POA: Diagnosis not present

## 2022-11-08 NOTE — Therapy (Signed)
OUTPATIENT PHYSICAL THERAPY TREATMENT     Patient Name: Monica Wallace MRN: [id number] DOB:11-18-1950, 72 y.o., female Today's Date: 11/08/2022  END OF SESSION:  PT End of Session - 11/08/22 1759     Visit Number 11    Number of Visits 12    Date for PT Re-Evaluation 11/17/22    Authorization Type Medicare & [employer]    Progress Note Due on Visit --   progress note completed visit 9   PT Start Time 1703    PT Stop Time 1746    PT Time Calculation (min) 43 min    Activity Tolerance Patient tolerated treatment well    Behavior During Therapy WFL for tasks assessed/performed                  Past Medical History:  Diagnosis Date   Arthritis    Asthma    Baker's cyst of knee, right 02/16/2017   Cancer 2002   thyroid (2002)   Cystic breast    Diabetes    GERD (gastroesophageal reflux disease)    Hepatic cyst 02/02/2015   Hyperlipidemia    Hypertension    Insomnia 07/23/2016   Menopause    Migraine    Palpitations    Preventative health care 01/18/2016   Sleep apnea 02/02/2015   Thyroid disease    Past Surgical History:  Procedure Laterality Date   ABDOMINAL HYSTERECTOMY     took right ovary and uterus   APPENDECTOMY     CARPAL TUNNEL RELEASE Right    CESAREAN SECTION     CHOLECYSTECTOMY     EYE SURGERY Bilateral 2017   cataracts   KNEE ARTHROPLASTY     SHOULDER ARTHROSCOPY W/ ROTATOR CUFF REPAIR Left    THYROIDECTOMY  2002   Patient Active Problem List   Diagnosis Date Noted   Mild persistent asthma without complication 04/22/2022   Dysfunction of both eustachian tubes 04/22/2022   UTI symptoms 04/07/2022   Hx of papillary thyroid carcinoma 02/28/2022   Postoperative hypothyroidism 02/28/2022   Left ankle pain 02/09/2022   Dizziness 02/08/2022   Edema 02/08/2022   Unsteady gait 11/14/2021   Lumbar radiculopathy 09/09/2021   Cervical cancer screening 03/07/2021   Sinusitis 10/30/2020   Allergic conjunctivitis 04/08/2020   Neck pain 02/06/2020    Right knee pain 09/30/2019   COVID-19 09/30/2019   Educated about COVID-19 virus infection 12/05/2018   Baker's cyst of knee, right 02/16/2017   Insomnia 07/23/2016   Hypokalemia 07/23/2016   Controlled type 2 diabetes mellitus without complication, without long-term current use of insulin 01/24/2016   Preventative health care 01/18/2016   Left-sided low back pain with left-sided sciatica 10/07/2015   Obesity (BMI 30-39.9) 06/05/2015   OSA (obstructive sleep apnea) 02/02/2015   Palpitations 09/19/2014   Plantar fasciitis, bilateral 11/19/2013   Pronation deformity of ankle, acquired 11/19/2013   DJD (degenerative joint disease) 08/26/2013   Fibrocystic breast 08/26/2013   HTN (hypertension) 08/26/2013   GERD (gastroesophageal reflux disease) 08/26/2013   S/P hysterectomy with oophorectomy 08/26/2013   Hypothyroidism 08/26/2013   Chronic hoarseness 08/26/2013   Colon polyp 08/26/2013   Hiatal hernia 08/26/2013   Irritable bowel syndrome 08/26/2013   Moderate persistent asthma 08/26/2013   Allergic rhinoconjunctivitis 08/26/2013   Hyperlipidemia 08/22/2013   Migraine 08/22/2013   Thyroid cancer 08/22/2013    PCP: Bradd Canary, MD  REFERRING PROVIDER: Weber Cooks MD  REFERRING DIAG: M17.9 (ICD-10-CM) - arthritis of knee  THERAPY DIAG:  Acute pain  of right knee  Unsteadiness on feet  Muscle weakness (generalized)  Difficulty in walking, not elsewhere classified  Rationale for Evaluation and Treatment: Rehabilitation  ONSET DATE: chronic, worsened over last couple of months  SUBJECTIVE:   SUBJECTIVE STATEMENT:  "Feeling better today, haven't had to use tylenol for a while. Have not had any pain for a while."  PERTINENT HISTORY: asthma, hypertension, thyroid cancer, hypothyrodism, diabetes mellitus type 2, cervical radiculopathy, arthritis, history LBP, R meniscal tears and OA, history of R knee surgery x 2  PAIN:  Are you having pain? Yes: NPRS scale:  0/10 Pain location: R lateral knee Pain description: sore, achey Aggravating factors: laying down, prolonged sitting, standing and walking, driving prolonged periods, stairs Relieving factors: tylenol, ibuprofen  PRECAUTIONS: Fall  WEIGHT BEARING RESTRICTIONS: No  FALLS:  Has patient fallen in last 6 months?  No but frequently stumbles, has almost fallen numerous times.   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   PLOF: Independent  PATIENT GOALS: get right leg stronger, improve balance, avoid TKA  NEXT MD VISIT: none currently schedule with orthopedist  OBJECTIVE:   DIAGNOSTIC FINDINGS: MR R knee 02/19/2022 IMPRESSION: 1. Attenuation of the posterior horn of the medial meniscus consistent with prior partial meniscectomy. Peripheral meniscal extrusion. No new meniscal tear. 2. Severe attenuation of the anterior horn and body of the lateral meniscus consistent with prior subtotal meniscectomy. Small oblique tear of the posterior horn of the lateral meniscus extending to the inferior articular surface. 3. Tricompartmental cartilage abnormalities as described above.  PATIENT SURVEYS:  LEFS 33/80 = 41.25%  COGNITION: Overall cognitive status: Within functional limits for tasks assessed     SENSATION: Slightly diminished R upper thigh L2 dermatome  EDEMA:  Circumferential: proximal knee 2 cm, distal knee 1 cm larger on R compared to left.   MUSCLE LENGTH:   POSTURE: rounded shoulders and forward head  PALPATION: Tenderness along R lateral joint line  LOWER EXTREMITY ROM:  Passive ROM Right eval Left eval Right 10/03/22 Right 11/08/22  Knee flexion 95 120 99 114  Knee extension 7 0 5 -1   (Blank  rows = not tested)  LOWER EXTREMITY MMT:  MMT Right eval Left eval  Hip flexion 3+ 4+  Hip extension    Hip abduction 5 5  Hip adduction 5 5  Knee flexion 4+p! 5  Knee extension 5p! 5  Ankle dorsiflexion 5 5  Ankle plantarflexion     (Blank rows = not tested)  LOWER EXTREMITY SPECIAL TESTS:  NT  FUNCTIONAL TESTS:  5 times sit to stand: 28 seconds with intermittant UE assist, noted R knee buckling 1x.  DGI 09/20/2022 - 18/24; 10/20/22- 19/24  GAIT: Distance walked: 50' Assistive device utilized: None Level of assistance: Complete Independence Comments: antalgic gait, lacking full R knee extension in terminal stance  10/20/22- improved TKE on R, poor eccentric control on R on stairs or step to gait.       TODAY'S TREATMENT:  DATE:  11/08/22 Therapeutic Exercise: to improve strength and mobility.  Demo, verbal and tactile cues throughout for technique. Bike L1x73min Assessed AROM and stairs  All HEP exercises reviewed briefly: Seated LAQ GTB x 10 bil Seated HS curls GTB x 10 bi S/L clamshells RTB Squats with UE support- cues for hip hinge  10/27/22 Nustep L3x21min Seated ball squeeze btw ankles 2x10 Seated isometric hip ABD with gait belt 2x10 Supine SLR x 10 R/L Supine R heel slides 2x10 S/L hip abduction 2x10 R Manual resisted clamshells S/L R 2x10 Supine QS 2x10 - 3 sec hold  10/20/22 Therapeutic Activity:  to assess progress towards goals. Nustep L5 x 10 min while discussing progress DGI LEFS 5xSTS ROM Ionotophoresis with 1.0 mL /ml Dexamethasone - 4-6 hour patch (29mA-min) to R lateral knee (patch #5 of 6).   10/13/22 Therapeutic Exercise: to improve strength and mobility.  Demo, verbal and tactile cues throughout for technique. Nustep L5 x 6 min  Step ups (6" step) 2 x 10 bil - cues to engage glutes with step up, reported decreased R  knee pain.  Wall squats 10 x 5 sec hold.  Manual Therapy: to decrease muscle spasm and pain and improve mobility IASTM to R quads, STM/TPR to R knee, skilled palpation and monitoring during dry needling. Trigger Point Dry-Needling  Treatment instructions: Expect mild to moderate muscle soreness. S/S of pneumothorax if dry needled over a lung field, and to seek immediate medical attention should they occur. Patient verbalized understanding of these instructions and education. Patient Consent Given: Yes Education handout provided: Previously provided Muscles treated: R anterior tib Electrical stimulation performed: No Parameters: N/A Treatment response/outcome: Twitch Response Elicited and Palpable Increase in Muscle Length Ionotophoresis with 1.0 mL /ml Dexamethasone - 4-6 hour patch (20mA-min) to R lateral knee (patch #4 of 6).   10/10/22 Therapeutic Exercise: to improve strength and mobility.  Demo, verbal and tactile cues throughout for technique. Nustep L5x25min Seated LAQ GTB x 10 bil Seated HS curls GTB x 10 bil Bil UE support: Standing hip abduction GTB at knees x 10 bil Standing hip extension GTB at knees x 10 bil Standing marches GTB at knees x 10 bil Mini squats with GTB at knees no UE support 2x10  10/06/22 Therapeutic Exercise: to improve strength and mobility.  Demo, verbal and tactile cues throughout for technique. Nustep L5 x 6 min  Prone HS curls 4# 2x10 Step ups 4" 2x10 Lateral step ups 4' 2x10 Knee extension 10# 2x10 BLE  Manual Therapy: STM to R thoracic paraspinals and QL  Ionotophoresis with 1.0 mL /ml Dexamethasone - 4-6 hour patch (22mA-min) to R lateral knee (patch #3 of 6).   10/03/22 Therapeutic Exercise: to improve strength and mobility.  Demo, verbal and tactile cues throughout for technique. Nustep L5 x 6 min  Seated bil LAQ x 15 2# weight  Standing bil HS curl x 12 2# weight Wall squats 10x Toe raise x 20 against wall Heel raise 2x10 at  wall Step up R LE 4' x 10  Lateral step up RLE 4' x 10 Supine L quad stretch with strap x 30 sec   PATIENT EDUCATION:  Education details: review of TrDN and ionto precautions and aftercare.  Person educated: Patient Education method: Explanation, Demonstration, Verbal cues, and Handouts Education comprehension: verbalized understanding and returned demonstration  HOME EXERCISE PROGRAM: Access Code: [email or web address] URL: [email or web address] Date: 11/08/2022 Prepared by: Verta Ellen  Exercises - Supine Active Straight Leg Raise  - 1 x  daily - 7 x weekly - 3 sets - 10 reps - Sidelying Hip Abduction  - 1 x daily - 7 x weekly - 3 sets - 10 reps - Prone Hip Extension  - 1 x daily - 7 x weekly - 3 sets - 10 reps - Clam with Resistance  - 1 x daily - 3-4 x weekly - 2 sets - 10 reps - Squat with Counter Support  - 1 x daily - 3-4 x weekly - 2 sets - 10 reps - Seated Knee Extension with Resistance  - 1 x daily - 3-4 x weekly - 3 sets - 10 reps - Seated Knee Flexion with Anchored Resistance  - 1 x daily - 3-4 x weekly - 3 sets - 10 reps  ASSESSMENT:  CLINICAL IMPRESSION: Monica Wallace arrived doing exteremly better today. She notes 85% improvement in R knee pain (LTG #2 met). She met LTG #3 for for R knee AROM (-1-0-114 deg). She did show decreased eccentric control on stairs with R knee, which is still expected. Reviewed HEP and consolidated to ensure proper understanding and compliance as patient feels come next appointment she will be ready for D/C or 30 day hold. Cues were required with the squats to hinge hips and avoid knees over toes.        OBJECTIVE IMPAIRMENTS: Abnormal gait, decreased activity tolerance, decreased balance, decreased endurance, decreased mobility, difficulty walking, decreased ROM, decreased strength, increased edema, increased fascial restrictions, increased muscle spasms, impaired flexibility, impaired sensation, and pain.   ACTIVITY LIMITATIONS:  carrying, lifting, bending, sitting, standing, squatting, sleeping, stairs, transfers, bed mobility, locomotion level, and caring for others  PARTICIPATION LIMITATIONS: cleaning, driving, shopping, community activity, and occupation  PERSONAL FACTORS: Age, Time since onset of injury/illness/exacerbation, and 3+ comorbidities: asthma, hypertension, thyroid cancer, hypothyrodism, diabetes mellitus type 2, cervical radiculopathy, arthritis, history LBP, R meniscal tears and OA    are also affecting patient's functional outcome.   REHAB POTENTIAL: Good  CLINICAL DECISION MAKING: Evolving/moderate complexity  EVALUATION COMPLEXITY: Moderate   GOALS: Goals reviewed with patient? Yes  SHORT TERM GOALS: Target date: 09/28/2022   Patient will be independent with initial HEP. Baseline: given Goal status: MET 10/10/22   LONG TERM GOALS: Target date: 10/26/2022 extended to 11/17/22  Patient will be independent with advanced/ongoing HEP to improve outcomes and carryover.  Baseline:  Goal status: IN PROGRESS 10/20/22- met for current  2.  Patient will report at least 75% improvement in Right knee pain to improve QOL. Baseline:  Goal status: MET 11/08/22- 85%   3.  Patient will demonstrate improved R knee AROM to >/= 0-110 deg to allow for normal gait and stair mechanics. Baseline: 7-95 Goal status: MET- 11/08/22- check ROM chart  4.  Patient will demonstrate improved functional LE strength as demonstrated by improved 5x STS to < 20 sec. Baseline: 28 seconds Goal status: MET 10/20/22 - 16.8 seconds   5.  Patient will be able to ambulate 600' with LRAD and normal gait pattern without increased pain to access community.  Baseline: antalgic gait, visually slow gait speed Goal status: IN PROGRESS 10/20/22 - improving, not antalgic over short distances  6. Patient will be able to ascend/descend stairs with 1 HR and reciprocal step pattern safely to access home and community.  Baseline: painful  descending stairs Goal status: IN PROGRESS 11/08/22- able to do reciprocal pattern but decreased R eccentric control  7.  Patient will report >45/80 on LEFS  to demonstrate improved functional ability.  Baseline: 33/80 Goal status: IN PROGRESS 10/20/22- improving, 40/80  8.  Patient will demonstrate at least 19/24 on DGI to decrease risk of falls. Baseline: 18/24 Goal status: MET 10/20/22- 19/24     PLAN:  PT FREQUENCY: 1-2x/week  PT DURATION: 4 weeks  PLANNED INTERVENTIONS: Therapeutic exercises, Therapeutic activity, Neuromuscular re-education, Balance training, Gait training, Patient/Family education, Self Care, Joint mobilization, Stair training, Orthotic/Fit training, Dry Needling, Electrical stimulation, Cryotherapy, Moist heat, Taping, Vasopneumatic device, Ionotophoresis 4mg /ml Dexamethasone, Manual therapy, and Re-evaluation  PLAN FOR NEXT SESSION: Per patient she is ready to wrap up PT; re-check goals, review HEP if needed, most likely 30 day hold   Korin Hartwell L Chestine Spore, PTA 11/08/2022, 6:07 PM

## 2022-11-16 ENCOUNTER — Ambulatory Visit: Payer: Medicare Other | Attending: Orthopedic Surgery | Admitting: Physical Therapy

## 2022-11-16 ENCOUNTER — Encounter: Payer: Self-pay | Admitting: Physical Therapy

## 2022-11-16 DIAGNOSIS — R2681 Unsteadiness on feet: Secondary | ICD-10-CM | POA: Diagnosis not present

## 2022-11-16 DIAGNOSIS — M25561 Pain in right knee: Secondary | ICD-10-CM | POA: Insufficient documentation

## 2022-11-16 DIAGNOSIS — M6281 Muscle weakness (generalized): Secondary | ICD-10-CM | POA: Insufficient documentation

## 2022-11-16 DIAGNOSIS — R262 Difficulty in walking, not elsewhere classified: Secondary | ICD-10-CM | POA: Insufficient documentation

## 2022-11-16 NOTE — Therapy (Addendum)
OUTPATIENT PHYSICAL THERAPY TREATMENT     Patient Name: Monica Wallace MRN: [id number] DOB:1950-09-29, 72 y.o., female Today's Date: 11/16/2022  END OF SESSION:  PT End of Session - 11/16/22 1704     Visit Number 12    Number of Visits 12    Date for PT Re-Evaluation 11/17/22    Authorization Type Medicare & [employer]    Progress Note Due on Visit --   progress note completed visit 9   PT Start Time 1702    PT Stop Time 1745    PT Time Calculation (min) 43 min    Activity Tolerance Patient tolerated treatment well    Behavior During Therapy WFL for tasks assessed/performed                  Past Medical History:  Diagnosis Date   Arthritis    Asthma    Baker's cyst of knee, right 02/16/2017   Cancer (HCC) 2002   thyroid (2002)   Cystic breast    Diabetes (HCC)    GERD (gastroesophageal reflux disease)    Hepatic cyst 02/02/2015   Hyperlipidemia    Hypertension    Insomnia 07/23/2016   Menopause    Migraine    Palpitations    Preventative health care 01/18/2016   Sleep apnea 02/02/2015   Thyroid disease    Past Surgical History:  Procedure Laterality Date   ABDOMINAL HYSTERECTOMY     took right ovary and uterus   APPENDECTOMY     CARPAL TUNNEL RELEASE Right    CESAREAN SECTION     CHOLECYSTECTOMY     EYE SURGERY Bilateral 2017   cataracts   KNEE ARTHROPLASTY     SHOULDER ARTHROSCOPY W/ ROTATOR CUFF REPAIR Left    THYROIDECTOMY  2002   Patient Active Problem List   Diagnosis Date Noted   Mild persistent asthma without complication 04/22/2022   Dysfunction of both eustachian tubes 04/22/2022   UTI symptoms 04/07/2022   Hx of papillary thyroid carcinoma 02/28/2022   Postoperative hypothyroidism 02/28/2022   Left ankle pain 02/09/2022   Dizziness 02/08/2022   Edema 02/08/2022   Unsteady gait 11/14/2021   Lumbar radiculopathy 09/09/2021   Cervical cancer screening 03/07/2021   Sinusitis 10/30/2020   Allergic conjunctivitis 04/08/2020   Neck pain  02/06/2020   Right knee pain 09/30/2019   COVID-19 09/30/2019   Educated about COVID-19 virus infection 12/05/2018   Baker's cyst of knee, right 02/16/2017   Insomnia 07/23/2016   Hypokalemia 07/23/2016   Controlled type 2 diabetes mellitus without complication, without long-term current use of insulin (HCC) 01/24/2016   Preventative health care 01/18/2016   Left-sided low back pain with left-sided sciatica 10/07/2015   Obesity (BMI 30-39.9) 06/05/2015   OSA (obstructive sleep apnea) 02/02/2015   Palpitations 09/19/2014   Plantar fasciitis, bilateral 11/19/2013   Pronation deformity of ankle, acquired 11/19/2013   DJD (degenerative joint disease) 08/26/2013   Fibrocystic breast 08/26/2013   HTN (hypertension) 08/26/2013   GERD (gastroesophageal reflux disease) 08/26/2013   S/P hysterectomy with oophorectomy 08/26/2013   Hypothyroidism 08/26/2013   Chronic hoarseness 08/26/2013   Colon polyp 08/26/2013   Hiatal hernia 08/26/2013   Irritable bowel syndrome 08/26/2013   Moderate persistent asthma 08/26/2013   Allergic rhinoconjunctivitis 08/26/2013   Hyperlipidemia 08/22/2013   Migraine 08/22/2013   Thyroid cancer (HCC) 08/22/2013    PCP: Bradd Canary, MD  REFERRING PROVIDER: Weber Cooks MD  REFERRING DIAG: M17.9 (ICD-10-CM) - arthritis of knee  THERAPY  DIAG:  Acute pain of right knee  Unsteadiness on feet  Muscle weakness (generalized)  Difficulty in walking, not elsewhere classified  Rationale for Evaluation and Treatment: Rehabilitation  ONSET DATE: chronic, worsened over last couple of months  SUBJECTIVE:   SUBJECTIVE STATEMENT: Knee is feeling a lot better since [place] drew that fluid off of it.   PERTINENT HISTORY: asthma, hypertension, thyroid cancer, hypothyrodism, diabetes mellitus type 2, cervical radiculopathy, arthritis, history LBP, R meniscal tears and OA, history of R knee surgery x 2  PAIN:  Are you having pain? Yes: NPRS scale:  0/10 Pain location: R lateral knee Pain description: sore, achey Aggravating factors: laying down, prolonged sitting, standing and walking, driving prolonged periods, stairs Relieving factors: tylenol, ibuprofen  PRECAUTIONS: Fall  WEIGHT BEARING RESTRICTIONS: No  FALLS:  Has patient fallen in last 6 months?  No but frequently stumbles, has almost fallen numerous times.   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   PLOF: Independent  PATIENT GOALS: get right leg stronger, improve balance, avoid TKA  NEXT MD VISIT: none currently schedule with orthopedist  OBJECTIVE:   DIAGNOSTIC FINDINGS: MR R knee 02/19/2022 IMPRESSION: 1. Attenuation of the posterior horn of the medial meniscus consistent with prior partial meniscectomy. Peripheral meniscal extrusion. No new meniscal tear. 2. Severe attenuation of the anterior horn and body of the lateral meniscus consistent with prior subtotal meniscectomy. Small oblique tear of the posterior horn of the lateral meniscus extending to the inferior articular surface. 3. Tricompartmental cartilage abnormalities as described above.  PATIENT SURVEYS:  LEFS 33/80 = 41.25% 11/16/22 LEFS 51/80 63.75%  COGNITION: Overall cognitive status: Within functional limits for tasks assessed     SENSATION: Slightly diminished R upper thigh L2 dermatome  EDEMA:  Circumferential: proximal knee 2 cm, distal knee 1 cm larger on R compared to left.   MUSCLE LENGTH:   POSTURE: rounded shoulders and forward head  PALPATION: Tenderness along R lateral joint line  LOWER EXTREMITY ROM:  Passive ROM Right eval Left eval Right 10/03/22 Right 11/08/22  Knee flexion 95 120 99 114  Knee  extension 7 0 5 -1   (Blank rows = not tested)  LOWER EXTREMITY MMT:  MMT Right eval Left eval  Hip flexion 3+ 4+  Hip extension    Hip abduction 5 5  Hip adduction 5 5  Knee flexion 4+p! 5  Knee extension 5p! 5  Ankle dorsiflexion 5 5  Ankle plantarflexion     (Blank rows = not tested)  LOWER EXTREMITY SPECIAL TESTS:  NT  FUNCTIONAL TESTS:  5 times sit to stand: 28 seconds with intermittant UE assist, noted R knee buckling 1x.  DGI 09/20/2022 - 18/24; 10/20/22- 19/24  GAIT: Distance walked: 66' Assistive device utilized: None Level of assistance: Complete Independence Comments: antalgic gait, lacking full R knee extension in terminal stance  10/20/22- improved TKE on R, poor eccentric control on R on stairs or step to gait.       TODAY'S TREATMENT:  DATE:   11/16/22 Therapeutic Exercise: to improve strength and mobility.  Demo, verbal and tactile cues throughout for technique. Bike L1 x 6 min  Review of HEP Therapeutic Activity:  review of goals and progress LEFS Manual Therapy: to decrease muscle spasm and pain and improve mobility IASTM to R quads, patellar tendon   11/08/22 Therapeutic Exercise: to improve strength and mobility.  Demo, verbal and tactile cues throughout for technique. Bike L1x58min Assessed AROM and stairs  All HEP exercises reviewed briefly: Seated LAQ GTB x 10 bil Seated HS curls GTB x 10 bi S/L clamshells RTB Squats with UE support- cues for hip hinge  10/27/22 Nustep L3x60min Seated ball squeeze btw ankles 2x10 Seated isometric hip ABD with gait belt 2x10 Supine SLR x 10 R/L Supine R heel slides 2x10 S/L hip abduction 2x10 R Manual resisted clamshells S/L R 2x10 Supine QS 2x10 - 3 sec hold  10/20/22 Therapeutic Activity:  to assess progress towards goals. Nustep L5 x 10 min while discussing  progress DGI LEFS 5xSTS ROM Ionotophoresis with 1.0 mL 4mg /ml Dexamethasone - 4-6 hour patch (33mA-min) to R lateral knee (patch #5 of 6).   10/13/22 Therapeutic Exercise: to improve strength and mobility.  Demo, verbal and tactile cues throughout for technique. Nustep L5 x 6 min  Step ups (6" step) 2 x 10 bil - cues to engage glutes with step up, reported decreased R knee pain.  Wall squats 10 x 5 sec hold.  Manual Therapy: to decrease muscle spasm and pain and improve mobility IASTM to R quads, STM/TPR to R knee, skilled palpation and monitoring during dry needling. Trigger Point Dry-Needling  Treatment instructions: Expect mild to moderate muscle soreness. S/S of pneumothorax if dry needled over a lung field, and to seek immediate medical attention should they occur. Patient verbalized understanding of these instructions and education. Patient Consent Given: Yes Education handout provided: Previously provided Muscles treated: R anterior tib Electrical stimulation performed: No Parameters: N/A Treatment response/outcome: Twitch Response Elicited and Palpable Increase in Muscle Length Ionotophoresis with 1.0 mL 4mg /ml Dexamethasone - 4-6 hour patch (28mA-min) to R lateral knee (patch #4 of 6).    PATIENT EDUCATION:  Education details: review of TrDN and ionto precautions and aftercare.  Person educated: Patient Education method: Explanation, Demonstration, Verbal cues, and Handouts Education comprehension: verbalized understanding and returned demonstration  HOME EXERCISE PROGRAM: Access Code: [email or web address] URL: [email or web address] Date: 11/08/2022 Prepared by: Verta Ellen  Exercises - Supine Active Straight Leg Raise  - 1 x daily - 7 x weekly - 3 sets - 10 reps - Sidelying Hip Abduction  - 1 x daily - 7 x weekly - 3 sets - 10 reps - Prone Hip Extension  - 1 x daily - 7 x weekly - 3 sets - 10 reps - Clam with Resistance  - 1 x daily - 3-4 x weekly - 2 sets - 10  reps - Squat with Counter Support  - 1 x daily - 3-4 x weekly - 2 sets - 10 reps - Seated Knee Extension with Resistance  - 1 x daily - 3-4 x weekly - 3 sets - 10 reps - Seated Knee Flexion with Anchored Resistance  - 1 x daily - 3-4 x weekly - 3 sets - 10 reps  ASSESSMENT:  CLINICAL IMPRESSION: JERMANY WAN has made good progress and has met or almost met all goals, main limitation is descending stairs due to weakness with eccentric control.  She did report som discomfort  in R patellar tendon after bike today, but in general R knee pain has improved significantly, and her LEFS has improved from 33/80 to 51/80 which is significant.  She reported decreased pain after manual therapy focusing on R quad and patellar tendon.  She is independent with HEP and will continue to work on strengthening at home.  At this time she is ready and appropriate for 30 day hold with discharge if no new issues arise.         OBJECTIVE IMPAIRMENTS: Abnormal gait, decreased activity tolerance, decreased balance, decreased endurance, decreased mobility, difficulty walking, decreased ROM, decreased strength, increased edema, increased fascial restrictions, increased muscle spasms, impaired flexibility, impaired sensation, and pain.   ACTIVITY LIMITATIONS: carrying, lifting, bending, sitting, standing, squatting, sleeping, stairs, transfers, bed mobility, locomotion level, and caring for others  PARTICIPATION LIMITATIONS: cleaning, driving, shopping, community activity, and occupation  PERSONAL FACTORS: Age, Time since onset of injury/illness/exacerbation, and 3+ comorbidities: asthma, hypertension, thyroid cancer, hypothyrodism, diabetes mellitus type 2, cervical radiculopathy, arthritis, history LBP, R meniscal tears and OA    are also affecting patient's functional outcome.   REHAB POTENTIAL: Good  CLINICAL DECISION MAKING: Evolving/moderate complexity  EVALUATION COMPLEXITY: Moderate   GOALS: Goals reviewed  with patient? Yes  SHORT TERM GOALS: Target date: 09/28/2022   Patient will be independent with initial HEP. Baseline: given Goal status: MET 10/10/22   LONG TERM GOALS: Target date: 10/26/2022 extended to 11/17/22  Patient will be independent with advanced/ongoing HEP to improve outcomes and carryover.  Baseline:  Goal status: MET 10/20/22- met for current 5/1//24- met.   2.  Patient will report at least 75% improvement in Right knee pain to improve QOL. Baseline:  Goal status: MET 11/08/22- 85%   3.  Patient will demonstrate improved R knee AROM to >/= 0-110 deg to allow for normal gait and stair mechanics. Baseline: 7-95 Goal status: MET- 11/08/22- check ROM chart  4.  Patient will demonstrate improved functional LE strength as demonstrated by improved 5x STS to < 20 sec. Baseline: 28 seconds Goal status: MET 10/20/22 - 16.8 seconds   5.  Patient will be able to ambulate 600' with LRAD and normal gait pattern without increased pain to access community.  Baseline: antalgic gait, visually slow gait speed Goal status: MET 10/20/22 - improving, not antalgic over short distances 11/16/22- completed 1050 feet in 6 minutes without increased R knee pain.    6. Patient will be able to ascend/descend stairs with 1 HR and reciprocal step pattern safely to access home and community.  Baseline: painful descending stairs Goal status: IN PROGRESS 11/08/22- able to do reciprocal pattern but decreased R eccentric control  7.  Patient will report >45/80 on LEFS  to demonstrate improved functional ability. Baseline: 33/80 Goal status: MET 10/20/22- improving, 40/80 11/16/22- 51/80  8.  Patient will demonstrate at least 19/24 on DGI to decrease risk of falls. Baseline: 18/24 Goal status: MET 10/20/22- 19/24     PLAN:  PT FREQUENCY: 1-2x/week  PT DURATION: 4 weeks  PLANNED INTERVENTIONS: Therapeutic exercises, Therapeutic activity, Neuromuscular re-education, Balance training, Gait training, Patient/Family  education, Self Care, Joint mobilization, Stair training, Orthotic/Fit training, Dry Needling, Electrical stimulation, Cryotherapy, Moist heat, Taping, Vasopneumatic device, Ionotophoresis 4mg /ml Dexamethasone, Manual therapy, and Re-evaluation  PLAN FOR NEXT SESSION: 30 day hold.    Jena Gauss, PT, DPT  11/16/2022, 5:56 PM   PHYSICAL THERAPY DISCHARGE SUMMARY  Visits from Start of Care: 12  Current functional level related to goals /  functional outcomes: From note above "Darriana Denn Tivis has made good progress and has met or almost met all goals, main limitation is descending stairs due to weakness with eccentric control.  She did report som discomfort in R patellar tendon after bike today, but in general R knee pain has improved significantly, and her LEFS has improved from 33/80 to 51/80 which is significant.  She reported decreased pain after manual therapy focusing on R quad and patellar tendon.  She is independent with HEP and will continue to work on strengthening at home.  At this time she is ready and appropriate for 30 day hold with discharge if no new issues arise. "   Remaining deficits: R knee pain, decreased R eccentric control   Education / Equipment: HEP  Plan: Patient agrees to discharge.  Patient is being discharged due to meeting the stated rehab goals.     Jena Gauss, PT  12/20/2022 1:50 PM

## 2022-12-01 ENCOUNTER — Ambulatory Visit: Payer: Medicare Other | Admitting: Family Medicine

## 2022-12-01 ENCOUNTER — Ambulatory Visit (INDEPENDENT_AMBULATORY_CARE_PROVIDER_SITE_OTHER): Payer: Medicare Other | Admitting: Family Medicine

## 2022-12-01 ENCOUNTER — Encounter: Payer: Self-pay | Admitting: Family Medicine

## 2022-12-01 VITALS — BP 136/68 | HR 80 | Ht 62.0 in | Wt 202.0 lb

## 2022-12-01 DIAGNOSIS — H6593 Unspecified nonsuppurative otitis media, bilateral: Secondary | ICD-10-CM | POA: Diagnosis not present

## 2022-12-01 DIAGNOSIS — I10 Essential (primary) hypertension: Secondary | ICD-10-CM

## 2022-12-01 DIAGNOSIS — E890 Postprocedural hypothyroidism: Secondary | ICD-10-CM

## 2022-12-01 DIAGNOSIS — E119 Type 2 diabetes mellitus without complications: Secondary | ICD-10-CM | POA: Diagnosis not present

## 2022-12-01 DIAGNOSIS — E7800 Pure hypercholesterolemia, unspecified: Secondary | ICD-10-CM | POA: Diagnosis not present

## 2022-12-01 MED ORDER — METHYLPREDNISOLONE 4 MG PO TABS
ORAL_TABLET | ORAL | 0 refills | Status: DC
Start: 2022-12-01 — End: 2023-02-27

## 2022-12-01 NOTE — Assessment & Plan Note (Signed)
Blood pressure is at goal for age and co-morbidities.   Recommendations: triamterene-hctz 37.5-25 mg daily, PRN lasix - BP goal <130/80 - monitor and log blood pressures at home - check around the same time each day in a relaxed setting - Limit salt to <2000 mg/day - Follow DASH eating plan (heart healthy diet) - limit alcohol to 2 standard drinks per day for men and 1 per day for women - avoid tobacco products - get at least 2 hours of regular aerobic exercise weekly Patient aware of signs/symptoms requiring further/urgent evaluation. Labs ordered

## 2022-12-01 NOTE — Progress Notes (Signed)
Established Patient Office Visit  Subjective   Patient ID: Monica Wallace, female    DOB: 06-26-1951  Age: 72 y.o. MRN: [id number]  Chief Complaint  Patient presents with   Medical Management of Chronic Issues   Diabetes    HPI  Patient is here for regular follow-up, chronic disease management.   11/03/22 she saw Dr. Maurine Minister for URI symptoms and was started on ABX and steroids for sinus infection and middle ear effusion. States the fluid behind her ears and some associated dizziness, pressure has not improved much. No spinning or dizziness, just an imbalance feeling. She is already taking regular Flonase without any improvement.  No pain, fevers, hearing loss, URI symptoms.    Diabetes: - Checking glucose at home: 70s fasting usually  - Medications: lifestyle measures - Compliance: good - Diet: low carb - Exercise: walking  - Denies symptoms of hypoglycemia, polyuria, polydipsia, numbness extremities, foot ulcers/trauma, wounds that are not healing, medication side effects  Lab Results  Component Value Date   HGBA1C 6.4 08/16/2022     Hypertension: - Medications: lasix 20 mg daily PRN, triamterene-hctz 37.5-25 mg daily  - Compliance: good - Checking BP at home: 130s/70s - Denies any SOB, recurrent headaches, CP, vision changes, LE edema, dizziness, palpitations, or medication side effects.   Hyperlipidemia: - medications: rosuvastatin 5 mg twice weekly - compliance: good - medication SEs: none The 10-year ASCVD risk score (Arnett DK, et al., 2019) is: 33.5%   Values used to calculate the score:     Age: 29 years     Sex: Female     Is Non-Hispanic African American: Yes     Diabetic: Yes     Tobacco smoker: No     Systolic Blood Pressure: 136 mmHg     Is BP treated: Yes     HDL Cholesterol: 48 mg/dL     Total Cholesterol: 214 mg/dL  Hypothyroidism: - Management: synthroid 137 mcg daily  -Taking medications as prescribed in the morning, apart from other foods,  meds, vitamins, etc.  -No recent changes to hair, skin, nails, energy levels         ROS All review of systems negative except what is listed in the HPI    Objective:     BP 136/68   Pulse 80   Ht 5\' 2"  (1.575 m)   Wt 202 lb (91.6 kg)   SpO2 98%   BMI 36.95 kg/m    Physical Exam Vitals reviewed.  Constitutional:      Appearance: Normal appearance.  HENT:     Head: Normocephalic and atraumatic.     Right Ear: A middle ear effusion is present.     Left Ear: A middle ear effusion is present.  Cardiovascular:     Rate and Rhythm: Normal rate and regular rhythm.     Pulses: Normal pulses.     Heart sounds: Normal heart sounds.  Pulmonary:     Effort: Pulmonary effort is normal.     Breath sounds: Normal breath sounds.  Musculoskeletal:     Cervical back: Normal range of motion and neck supple. No tenderness.  Lymphadenopathy:     Cervical: No cervical adenopathy.  Skin:    General: Skin is warm and dry.  Neurological:     Mental Status: She is alert and oriented to person, place, and time.  Psychiatric:        Mood and Affect: Mood normal.        Behavior: Behavior  normal.        Thought Content: Thought content normal.        Judgment: Judgment normal.      No results found for any visits on 12/01/22.    The 10-year ASCVD risk score (Arnett DK, et al., 2019) is: 33.5%    Assessment & Plan:   Problem List Items Addressed This Visit     Hyperlipidemia    Medication management: rosuvastatin 5 mg twice weekly Lifestyle factors for lowering cholesterol include: Diet therapy - heart-healthy diet rich in fruits, veggies, fiber-rich whole grains, lean meats, chicken, fish (at least twice a week), fat-free or 1% dairy products; foods low in saturated/trans fats, cholesterol, sodium, and sugar. Mediterranean diet has shown to be very heart healthy. Regular exercise - recommend at least 30 minutes a day, 5 times per week Weight management  Repeat CMP and  lipid panel ordered       Relevant Orders   Lipid panel   HTN (hypertension)    Blood pressure is at goal for age and co-morbidities.   Recommendations: triamterene-hctz 37.5-25 mg daily, PRN lasix - BP goal <130/80 - monitor and log blood pressures at home - check around the same time each day in a relaxed setting - Limit salt to <2000 mg/day - Follow DASH eating plan (heart healthy diet) - limit alcohol to 2 standard drinks per day for men and 1 per day for women - avoid tobacco products - get at least 2 hours of regular aerobic exercise weekly Patient aware of signs/symptoms requiring further/urgent evaluation. Labs ordered       Relevant Orders   CBC with Differential/Platelet   Comprehensive metabolic panel   Hypothyroidism    Previously controlled. No new symptoms Continue Synthroid at current dose  Recheck TSH and adjust Synthroid as indicated       Relevant Orders   TSH   Controlled type 2 diabetes mellitus without complication, without long-term current use of insulin (HCC) - Primary    Well controlled with last A1c 6.4% Continue lifestyle measures Discussed diet and exercise F/u in 3 months       Relevant Orders   HgB A1c   RESOLVED: Postoperative hypothyroidism   Relevant Orders   TSH   Other Visit Diagnoses     Fluid level behind tympanic membrane of both ears     Continue Flonase. Will try a medrol dosepak to see if any improvement. Refer to ENT if not improving.    Relevant Medications   methylPREDNISolone (MEDROL) 4 MG tablet       Return in about 3 months (around 03/03/2023) for routine follow-up.    Clayborne Dana, NP

## 2022-12-01 NOTE — Assessment & Plan Note (Signed)
Medication management: rosuvastatin 5 mg twice weekly Lifestyle factors for lowering cholesterol include: Diet therapy - heart-healthy diet rich in fruits, veggies, fiber-rich whole grains, lean meats, chicken, fish (at least twice a week), fat-free or 1% dairy products; foods low in saturated/trans fats, cholesterol, sodium, and sugar. Mediterranean diet has shown to be very heart healthy. Regular exercise - recommend at least 30 minutes a day, 5 times per week Weight management  Repeat CMP and lipid panel ordered

## 2022-12-01 NOTE — Assessment & Plan Note (Signed)
Previously controlled. No new symptoms Continue Synthroid at current dose  Recheck TSH and adjust Synthroid as indicated

## 2022-12-01 NOTE — Assessment & Plan Note (Addendum)
Well controlled with last A1c 6.4% Continue lifestyle measures Discussed diet and exercise F/u in 3 months

## 2022-12-05 ENCOUNTER — Other Ambulatory Visit: Payer: Medicare Other

## 2022-12-06 ENCOUNTER — Other Ambulatory Visit (INDEPENDENT_AMBULATORY_CARE_PROVIDER_SITE_OTHER): Payer: Medicare Other

## 2022-12-06 DIAGNOSIS — E119 Type 2 diabetes mellitus without complications: Secondary | ICD-10-CM

## 2022-12-06 DIAGNOSIS — I10 Essential (primary) hypertension: Secondary | ICD-10-CM | POA: Diagnosis not present

## 2022-12-06 DIAGNOSIS — E7800 Pure hypercholesterolemia, unspecified: Secondary | ICD-10-CM | POA: Diagnosis not present

## 2022-12-06 DIAGNOSIS — E890 Postprocedural hypothyroidism: Secondary | ICD-10-CM | POA: Diagnosis not present

## 2022-12-07 ENCOUNTER — Other Ambulatory Visit: Payer: Self-pay | Admitting: Family Medicine

## 2022-12-07 ENCOUNTER — Encounter: Payer: Self-pay | Admitting: Family Medicine

## 2022-12-07 DIAGNOSIS — M62838 Other muscle spasm: Secondary | ICD-10-CM

## 2022-12-07 LAB — CBC WITH DIFFERENTIAL/PLATELET
Basophils Absolute: 0.1 10*3/uL (ref 0.0–0.1)
Basophils Relative: 0.8 % (ref 0.0–3.0)
Eosinophils Absolute: 0.1 10*3/uL (ref 0.0–0.7)
Eosinophils Relative: 1.3 % (ref 0.0–5.0)
HCT: 37.6 % (ref 36.0–46.0)
Hemoglobin: 12.4 g/dL (ref 12.0–15.0)
Lymphocytes Relative: 34.2 % (ref 12.0–46.0)
Lymphs Abs: 3.9 10*3/uL (ref 0.7–4.0)
MCHC: 33 g/dL (ref 30.0–36.0)
MCV: 92.5 fl (ref 78.0–100.0)
Monocytes Absolute: 0.6 10*3/uL (ref 0.1–1.0)
Monocytes Relative: 5.3 % (ref 3.0–12.0)
Neutro Abs: 6.6 10*3/uL (ref 1.4–7.7)
Neutrophils Relative %: 58.4 % (ref 43.0–77.0)
Platelets: 333 10*3/uL (ref 150.0–400.0)
RBC: 4.07 Mil/uL (ref 3.87–5.11)
RDW: 13.8 % (ref 11.5–15.5)
WBC: 11.3 10*3/uL — ABNORMAL HIGH (ref 4.0–10.5)

## 2022-12-07 LAB — COMPREHENSIVE METABOLIC PANEL
ALT: 19 U/L (ref 0–35)
AST: 22 U/L (ref 0–37)
Albumin: 3.8 g/dL (ref 3.5–5.2)
Alkaline Phosphatase: 90 U/L (ref 39–117)
BUN: 14 mg/dL (ref 6–23)
CO2: 31 mEq/L (ref 19–32)
Calcium: 9.4 mg/dL (ref 8.4–10.5)
Chloride: 98 mEq/L (ref 96–112)
Creatinine, Ser: 0.94 mg/dL (ref 0.40–1.20)
GFR: 60.08 mL/min (ref >60.00)
Glucose, Bld: 101 mg/dL — ABNORMAL HIGH (ref 70–99)
Potassium: 3.7 mEq/L (ref 3.5–5.1)
Sodium: 138 mEq/L (ref 135–145)
Total Bilirubin: 0.2 mg/dL (ref 0.2–1.2)
Total Protein: 6.7 g/dL (ref 6.0–8.3)

## 2022-12-07 LAB — LIPID PANEL
Cholesterol: 195 mg/dL (ref 0–200)
HDL: 52.9 mg/dL (ref >39.00)
NonHDL: 142.36
Total CHOL/HDL Ratio: 4
Triglycerides: 292 mg/dL — ABNORMAL HIGH (ref 0.0–149.0)
VLDL: 58.4 mg/dL — ABNORMAL HIGH (ref 0.0–40.0)

## 2022-12-07 LAB — HEMOGLOBIN A1C: Hgb A1c MFr Bld: 6.3 % (ref 4.6–6.5)

## 2022-12-07 LAB — LDL CHOLESTEROL, DIRECT: Direct LDL: 105 mg/dL

## 2022-12-07 LAB — TSH: TSH: 1.85 u[IU]/mL (ref 0.35–5.50)

## 2022-12-21 ENCOUNTER — Encounter: Payer: Self-pay | Admitting: Family Medicine

## 2022-12-21 ENCOUNTER — Ambulatory Visit (INDEPENDENT_AMBULATORY_CARE_PROVIDER_SITE_OTHER): Payer: Medicare Other | Admitting: Family Medicine

## 2022-12-21 VITALS — BP 137/66 | HR 70 | Ht 62.0 in | Wt 204.0 lb

## 2022-12-21 DIAGNOSIS — R42 Dizziness and giddiness: Secondary | ICD-10-CM

## 2022-12-21 DIAGNOSIS — R600 Localized edema: Secondary | ICD-10-CM

## 2022-12-21 DIAGNOSIS — H6591 Unspecified nonsuppurative otitis media, right ear: Secondary | ICD-10-CM

## 2022-12-21 LAB — COMPREHENSIVE METABOLIC PANEL
ALT: 20 U/L (ref 0–35)
AST: 21 U/L (ref 0–37)
Albumin: 3.8 g/dL (ref 3.5–5.2)
Alkaline Phosphatase: 72 U/L (ref 39–117)
BUN: 13 mg/dL (ref 6–23)
CO2: 28 mEq/L (ref 19–32)
Calcium: 9.1 mg/dL (ref 8.4–10.5)
Chloride: 99 mEq/L (ref 96–112)
Creatinine, Ser: 0.91 mg/dL (ref 0.40–1.20)
GFR: 62.44 mL/min (ref >60.00)
Glucose, Bld: 115 mg/dL — ABNORMAL HIGH (ref 70–99)
Potassium: 3.6 mEq/L (ref 3.5–5.1)
Sodium: 141 mEq/L (ref 135–145)
Total Bilirubin: 0.3 mg/dL (ref 0.2–1.2)
Total Protein: 6.7 g/dL (ref 6.0–8.3)

## 2022-12-21 LAB — CBC WITH DIFFERENTIAL/PLATELET
Basophils Absolute: 0 10*3/uL (ref 0.0–0.1)
Basophils Relative: 0.5 % (ref 0.0–3.0)
Eosinophils Absolute: 0.2 10*3/uL (ref 0.0–0.7)
Eosinophils Relative: 2.1 % (ref 0.0–5.0)
HCT: 39.6 % (ref 36.0–46.0)
Hemoglobin: 12.7 g/dL (ref 12.0–15.0)
Lymphocytes Relative: 27.5 % (ref 12.0–46.0)
Lymphs Abs: 2.6 10*3/uL (ref 0.7–4.0)
MCHC: 32 g/dL (ref 30.0–36.0)
MCV: 93.2 fl (ref 78.0–100.0)
Monocytes Absolute: 0.7 10*3/uL (ref 0.1–1.0)
Monocytes Relative: 7.2 % (ref 3.0–12.0)
Neutro Abs: 5.8 10*3/uL (ref 1.4–7.7)
Neutrophils Relative %: 62.7 % (ref 43.0–77.0)
Platelets: 280 10*3/uL (ref 150.0–400.0)
RBC: 4.25 Mil/uL (ref 3.87–5.11)
RDW: 13.6 % (ref 11.5–15.5)
WBC: 9.3 10*3/uL (ref 4.0–10.5)

## 2022-12-21 LAB — BRAIN NATRIURETIC PEPTIDE: Pro B Natriuretic peptide (BNP): 58 pg/mL (ref 0.0–100.0)

## 2022-12-21 MED ORDER — FUROSEMIDE 20 MG PO TABS
ORAL_TABLET | ORAL | 3 refills | Status: DC
Start: 2022-12-21 — End: 2024-03-28

## 2022-12-21 NOTE — Progress Notes (Signed)
Acute Office Visit  Subjective:     Patient ID: Monica Wallace, female    DOB: 06-07-1951, 72 y.o.   MRN: [id number]  Chief Complaint  Patient presents with   Gait Problem    HPI Patient is in today for dizziness.   Discussed the use of AI scribe software for clinical note transcription with the patient, who gave verbal consent to proceed.  History of Present Illness   The patient, with a history of fluid in the ears, presents with ongoing imbalance and blurred vision. They report that the imbalance has slightly improved but remains a concern, particularly when bending over or walking. 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 The blurred vision has started worsening in the past few weeks and varies day to day (history of vision problems, cataracts, etc). They have an upcoming appointment with their ophthalmologist for a yearly check-up and to address scar tissue from previous cataract surgery.  The patient also reports pressure in their head and ears, and occasional stumbling and tripping over their feet. They have a history of a neurological event that left their right side feeling different, but this has been stable for a few years. They have been diagnosed with spinal stenosis and a problem at C5 or 6, which was managed with physical therapy and home exercises.  In addition, the patient has noticed recent weight gain and swelling in their ankles, despite no changes in their diet. They have run out of their prescribed Lasix. They are also on allergy medication, but feel it is not as effective as Claritin, which they used to take. They have been advised against taking Sudafed.   Patient denies any chest pain, palpitations, dyspnea, wheezing, edema, recurrent headaches, new asymmetrical weakness/numbness, facial asymmetry, confusion, speech changes.            ROS All review of systems negative except what is  listed in the HPI      Objective:    BP 137/66   Pulse 70   Ht 5\' 2"  (1.575 m)   Wt 204 lb (92.5 kg)   SpO2 100%   BMI 37.31 kg/m    Physical Exam Vitals reviewed.  Constitutional:      General: She is not in acute distress.    Appearance: Normal appearance. She is not ill-appearing.  HENT:     Head: Normocephalic and atraumatic.     Right Ear: A middle ear effusion is present.     Nose: Congestion present.     Mouth/Throat:     Mouth: Mucous membranes are moist.     Pharynx: Oropharynx is clear.  Eyes:     Extraocular Movements: Extraocular movements intact.     Conjunctiva/sclera: Conjunctivae normal.     Pupils: Pupils are equal, round, and reactive to light.  Cardiovascular:     Rate and Rhythm: Normal rate and regular rhythm.     Pulses: Normal pulses.     Heart sounds: Normal heart sounds.  Pulmonary:     Effort: Pulmonary effort is normal.     Breath sounds: Normal breath sounds.  Musculoskeletal:        General: Normal range of motion.     Cervical back: Normal range of motion and neck supple.     Comments: BLE 1+  Skin:    General: Skin is warm and dry.  Neurological:     General: No focal deficit present.  Mental Status: She is alert and oriented to person, place, and time. Mental status is at baseline.     Cranial Nerves: No cranial nerve deficit.     Sensory: No sensory deficit.     Motor: No weakness.     Coordination: Coordination normal.     Gait: Gait normal.     Comments: NIHSS 0  Psychiatric:        Mood and Affect: Mood normal.        Behavior: Behavior normal.        Thought Content: Thought content normal.        Judgment: Judgment normal.     No results found for any visits on 12/21/22.      Assessment & Plan:   Problem List Items Addressed This Visit     Dizziness - Primary Fluid level behind tympanic membrane of right ear  -No signs of stroke on neuro exam. NIHSS = 0 -Follow-up with your neurologist and ENT for further  evaluation. -Continue physical therapy exercises. -R middle ear effusion. Switch back to Claritin as that seemed to work better. Continue Flonase. See your ENT. -Keep upcoming eye doctor appointment for ongoing vision changes. Seek immediate medical attention for drastic vision changes -We discussed symptoms of stroke and other emergencies requiring immediate care    Relevant Orders   CBC with Differential/Platelet   Comprehensive metabolic panel   Edema -Refill PRN Lasix prescription. -Order blood work to evaluate for other potential causes. -Low sodium diet, elevate legs when seated, wear compression socks   Relevant Medications   furosemide (LASIX) 20 MG tablet    Orders Placed This Encounter  Procedures   CBC with Differential/Platelet   Brain natriuretic peptide   Comprehensive metabolic panel      Meds ordered this encounter  Medications   furosemide (LASIX) 20 MG tablet    Sig: 1 tab by mouth daily as needed for edema, weight gain >3lbs in 24 hours    Dispense:  30 tablet    Refill:  3    Order Specific Question:   Supervising Provider    Answer:   Danise Edge A [[id number]]    Return if symptoms worsen or fail to improve.  Clayborne Dana, NP

## 2022-12-21 NOTE — Patient Instructions (Signed)
Normal neuro exam today Keep your upcoming eye appointment You still have fluid behind your right ear which could be contributing - continue Flonase, change to Claritin since that worked better in the past, scheduled follow-up with your ENT Would also recommend you go ahead and follow-up with your neurologist. Exam was unremarkable today. We discussed warning of signs of stroke to watch for and when to seek emergent care.  Labs today Low sodium diet, elevated legs, wear compression socks, as needed lasix

## 2022-12-27 ENCOUNTER — Other Ambulatory Visit: Payer: Self-pay | Admitting: Family

## 2022-12-27 ENCOUNTER — Other Ambulatory Visit: Payer: Self-pay | Admitting: Family Medicine

## 2023-01-02 ENCOUNTER — Emergency Department (HOSPITAL_BASED_OUTPATIENT_CLINIC_OR_DEPARTMENT_OTHER): Payer: Medicare Other

## 2023-01-02 ENCOUNTER — Emergency Department (HOSPITAL_BASED_OUTPATIENT_CLINIC_OR_DEPARTMENT_OTHER)
Admission: EM | Admit: 2023-01-02 | Discharge: 2023-01-02 | Disposition: A | Discharge status: Home / Self Care | Payer: Medicare Other | Attending: Emergency Medicine | Admitting: Emergency Medicine

## 2023-01-02 ENCOUNTER — Encounter (HOSPITAL_BASED_OUTPATIENT_CLINIC_OR_DEPARTMENT_OTHER): Payer: Self-pay

## 2023-01-02 ENCOUNTER — Other Ambulatory Visit: Payer: Self-pay

## 2023-01-02 DIAGNOSIS — Z79899 Other long term (current) drug therapy: Secondary | ICD-10-CM | POA: Diagnosis not present

## 2023-01-02 DIAGNOSIS — R519 Headache, unspecified: Secondary | ICD-10-CM | POA: Diagnosis not present

## 2023-01-02 DIAGNOSIS — R42 Dizziness and giddiness: Secondary | ICD-10-CM | POA: Insufficient documentation

## 2023-01-02 DIAGNOSIS — E860 Dehydration: Secondary | ICD-10-CM | POA: Insufficient documentation

## 2023-01-02 DIAGNOSIS — E119 Type 2 diabetes mellitus without complications: Secondary | ICD-10-CM | POA: Insufficient documentation

## 2023-01-02 DIAGNOSIS — I10 Essential (primary) hypertension: Secondary | ICD-10-CM | POA: Insufficient documentation

## 2023-01-02 LAB — COMPREHENSIVE METABOLIC PANEL
ALT: 21 U/L (ref 0–44)
AST: 26 U/L (ref 15–41)
Albumin: 3.7 g/dL (ref 3.5–5.0)
Alkaline Phosphatase: 71 U/L (ref 38–126)
Anion gap: 9 (ref 5–15)
BUN: 15 mg/dL (ref 8–23)
CO2: 27 mmol/L (ref 22–32)
Calcium: 8.9 mg/dL (ref 8.9–10.3)
Chloride: 99 mmol/L (ref 98–111)
Creatinine, Ser: 0.86 mg/dL (ref 0.44–1.00)
GFR, Estimated: >60 mL/min (ref >60)
Glucose, Bld: 98 mg/dL (ref 70–99)
Potassium: 3.1 mmol/L — ABNORMAL LOW (ref 3.5–5.1)
Sodium: 135 mmol/L (ref 135–145)
Total Bilirubin: 0.3 mg/dL (ref 0.3–1.2)
Total Protein: 7.4 g/dL (ref 6.5–8.1)

## 2023-01-02 LAB — CBC WITH DIFFERENTIAL/PLATELET
Abs Immature Granulocytes: 0.03 10*3/uL (ref 0.00–0.07)
Basophils Absolute: 0 10*3/uL (ref 0.0–0.1)
Basophils Relative: 0 %
Eosinophils Absolute: 0.2 10*3/uL (ref 0.0–0.5)
Eosinophils Relative: 2 %
HCT: 40 % (ref 36.0–46.0)
Hemoglobin: 13.2 g/dL (ref 12.0–15.0)
Immature Granulocytes: 0 %
Lymphocytes Relative: 36 %
Lymphs Abs: 3.8 10*3/uL (ref 0.7–4.0)
MCH: 30.3 pg (ref 26.0–34.0)
MCHC: 33 g/dL (ref 30.0–36.0)
MCV: 91.7 fL (ref 80.0–100.0)
Monocytes Absolute: 0.7 10*3/uL (ref 0.1–1.0)
Monocytes Relative: 7 %
Neutro Abs: 5.7 10*3/uL (ref 1.7–7.7)
Neutrophils Relative %: 55 %
Platelets: 314 10*3/uL (ref 150–400)
RBC: 4.36 MIL/uL (ref 3.87–5.11)
RDW: 13.7 % (ref 11.5–15.5)
WBC: 10.3 10*3/uL (ref 4.0–10.5)
nRBC: 0 % (ref 0.0–0.2)

## 2023-01-02 MED ORDER — ACETAMINOPHEN 325 MG PO TABS
650.0000 mg | ORAL_TABLET | Freq: Once | ORAL | Status: AC
  Administered 2023-01-02: 650 mg via ORAL
  Filled 2023-01-02: qty 2

## 2023-01-02 MED ORDER — ONDANSETRON 4 MG PO TBDP
4.0000 mg | ORAL_TABLET | Freq: Once | ORAL | Status: AC
  Administered 2023-01-02: 4 mg via ORAL
  Filled 2023-01-02: qty 1

## 2023-01-02 MED ORDER — SODIUM CHLORIDE 0.9 % IV BOLUS
1000.0000 mL | Freq: Once | INTRAVENOUS | Status: AC
  Administered 2023-01-02: 1000 mL via INTRAVENOUS

## 2023-01-02 MED ORDER — MECLIZINE HCL 25 MG PO TABS: 25.0000 mg | ORAL_TABLET | Freq: Two times a day (BID) | ORAL | 0 refills | Status: AC | PRN

## 2023-01-02 MED ORDER — MECLIZINE HCL 25 MG PO TABS
25.0000 mg | ORAL_TABLET | Freq: Once | ORAL | Status: AC
  Administered 2023-01-02: 25 mg via ORAL
  Filled 2023-01-02: qty 1

## 2023-01-02 NOTE — Discharge Instructions (Signed)
Follow up with ENT  

## 2023-01-02 NOTE — ED Triage Notes (Signed)
Pt states she woke up this morning and the room was spinning. Pt states she was outside in the heat all day yesterday. Pt c/o nausea, has not had emesis. Pt has been able to keep down liquids.

## 2023-01-02 NOTE — ED Provider Notes (Signed)
[facility] Provider Note   CSN: [id number] Arrival date & time: 01/02/23  1515     History  Chief Complaint  Patient presents with   Dizziness   Dehydration    Monica Wallace is a 72 y.o. female.  Patient here with some lightheadedness, vertigo type symptoms, dehydration concern.  She has been dealing with some inner ear issues the last few weeks.  She is working outside in the heat yesterday.  She feels a little bit weak and lightheaded when she stands.  She denies any black or bloody stools.  She denies any weakness numbness or vision changes.  Symptoms have gotten better since getting Zofran in the waiting room.  She has been able to eat/drink without throwing up.  She denies any chest pain shortness of breath abdominal pain.  History of high cholesterol, hypertension, diabetes, reflux  The history is provided by the patient.       Home Medications Prior to Admission medications   Medication Sig Start Date End Date Taking? Authorizing Provider  meclizine (ANTIVERT) 25 MG tablet Take 1 tablet (25 mg total) by mouth 2 (two) times daily as needed for up to 20 doses for dizziness. 01/02/23  Yes Zurie Platas, DO  acetaminophen (TYLENOL) 325 MG tablet Take 650 mg by mouth every 6 (six) hours as needed.    [provider]  albuterol (PROVENTIL) (2.5 MG/3ML) 0.083% nebulizer solution Take 3 mLs (2.5 mg total) by nebulization every 6 (six) hours as needed for wheezing or shortness of breath. 11/03/22   Verlee Monte, MD  albuterol (VENTOLIN HFA) 108 (90 Base) MCG/ACT inhaler Inhale 2 puffs into the lungs every 6 (six) hours as needed for wheezing or shortness of breath. 11/03/22   Verlee Monte, MD  azelastine (ASTELIN) 0.1 % nasal spray 1-2 sprays per nostril twice daily for runny nose 11/03/22   Verlee Monte, MD  benzonatate (TESSALON PERLES) 100 MG capsule Take 2 capsules (200 mg total) by mouth 3 (three) times daily as needed  for cough. 05/18/22   Arby Barrette, MD  Blood Glucose Monitoring Suppl (ONETOUCH VERIO FLEX SYSTEM) w/Device KIT Use to check blood glucose once a day.  DX code: E11.9 02/06/20   Bradd Canary, MD  budesonide (PULMICORT FLEXHALER) 180 MCG/ACT inhaler 2 puffs twice daily to prevent coughing or wheezing. 11/03/22   Verlee Monte, MD  Cholecalciferol ([employer] VIT D 5000 HIGH-POTENCY PO) Take 5,000 Units by mouth daily.    [provider]  cyclobenzaprine (FLEXERIL) 5 MG tablet Take 1 tablet (5 mg total) by mouth 3 (three) times daily as needed for muscle spasms. 10/04/22   Clayborne Dana, NP  estradiol (ESTRACE VAGINAL) 0.1 MG/GM vaginal cream Place 1 Applicatorful vaginally at bedtime. 04/28/21   Willodean Rosenthal, MD  famotidine (PEPCID) 40 MG tablet Take 1 tablet (40 mg total) by mouth at bedtime. 05/12/22   Bradd Canary, MD  fluticasone (FLONASE) 50 MCG/ACT nasal spray Place 2 sprays into both nostrils daily. 11/03/22   Verlee Monte, MD  furosemide (LASIX) 20 MG tablet 1 tab by mouth daily as needed for edema, weight gain >3lbs in 24 hours 12/21/22   Hyman Hopes B, NP  glucose blood (ONETOUCH VERIO) test strip TEST BLOOD GLUCOSE ONCE A DAY 11/05/21   Bradd Canary, MD  hyoscyamine (LEVSIN SL) 0.125 MG SL tablet DISSOLVE 1 TABLET(0.125 MG) UNDER THE TONGUE EVERY 4 HOURS AS NEEDED 10/07/22  Bradd Canary, MD  ipratropium (ATROVENT) 0.03 % nasal spray Place 2 sprays into both nostrils 2 (two) times daily as needed for rhinitis. Use less frequently if you become too dry. 11/03/22   Verlee Monte, MD  Lancets [facility] DELICA PLUS [place]) MISC TEST BLOOD SUGAR ONCE A DAY 11/05/21   Bradd Canary, MD  levocetirizine (XYZAL) 5 MG tablet TAKE 1 TABLET(5 MG) BY MOUTH EVERY EVENING 11/03/22   Verlee Monte, MD  methylPREDNISolone (MEDROL) 4 MG tablet Standard 6 day taper 12/01/22   Hyman Hopes B, NP  montelukast (SINGULAIR) 10 MG tablet TAKE 1 TABLET(10 MG) BY MOUTH AT BEDTIME 11/03/22    Verlee Monte, MD  nystatin cream (MYCOSTATIN) APPLY TOPICALLY TO THE AFFECTED AREA TWICE DAILY 12/27/22   Bradd Canary, MD  Olopatadine HCl 0.2 % SOLN Place 1 drop in each eye once a day as needed for itchy watery eyes 11/03/22   Verlee Monte, MD  Omega-3 1000 MG CAPS Take 1,000 mg by mouth daily.    [provider]  omeprazole (PRILOSEC) 40 MG capsule Take 1 capsule (40 mg total) by mouth daily as needed (reflux). 11/03/22   Verlee Monte, MD  potassium chloride SA (KLOR-CON M) 20 MEQ tablet TAKE 2 TABLETS BY MOUTH DAILY FOR 3 DAYS. THEN TAKE 1 TABLET BY MOUTH DAILY. 11/10/21   Bradd Canary, MD  rosuvastatin (CRESTOR) 5 MG tablet Take 1 tablet (5 mg total) by mouth 2 (two) times a week. 12/29/22   Bradd Canary, MD  SYNTHROID 137 MCG tablet Take 1 tablet (137 mcg total) by mouth daily before breakfast. 02/28/22   Shamleffer, Konrad Dolores, MD  triamterene-hydrochlorothiazide (MAXZIDE-25) 37.5-25 MG tablet TAKE 1 TABLET BY MOUTH DAILY 08/22/22   Bradd Canary, MD      Allergies    Levaquin [levofloxacin], Clindamycin/lincomycin, Codeine, and Gabapentin    Review of Systems   Review of Systems  Physical Exam Updated Vital Signs BP 136/76   Pulse 71   Temp 98 F (36.7 C)   Resp 10   Ht 5\' 2"  (1.575 m)   Wt 91.1 kg   SpO2 99%   BMI 36.73 kg/m  Physical Exam Vitals and nursing note reviewed.  Constitutional:      General: She is not in acute distress.    Appearance: She is well-developed.  HENT:     Head: Normocephalic and atraumatic.     Nose: Nose normal.     Mouth/Throat:     Mouth: Mucous membranes are moist.  Eyes:     Extraocular Movements: Extraocular movements intact.     Conjunctiva/sclera: Conjunctivae normal.     Pupils: Pupils are equal, round, and reactive to light.  Cardiovascular:     Rate and Rhythm: Normal rate and regular rhythm.     Heart sounds: No murmur heard. Pulmonary:     Effort: Pulmonary effort is normal. No respiratory distress.      Breath sounds: Normal breath sounds.  Abdominal:     Palpations: Abdomen is soft.     Tenderness: There is no abdominal tenderness.  Musculoskeletal:        General: No swelling.     Cervical back: Neck supple.  Skin:    General: Skin is warm and dry.     Capillary Refill: Capillary refill takes less than 2 seconds.  Neurological:     Mental Status: She is alert.     Comments: 5+ out of 5  strength throughout, cranial nerves intact, normal sensation, no drift, normal finger-nose-finger, she able to ambulate fairly well without major symptoms.  No nystagmus.  Psychiatric:        Mood and Affect: Mood normal.     ED Results / Procedures / Treatments   Labs (all labs ordered are listed, but only abnormal results are displayed) Labs Reviewed  COMPREHENSIVE METABOLIC PANEL - Abnormal; Notable for the following components:      Result Value   Potassium 3.1 (*)    All other components within normal limits  CBC WITH DIFFERENTIAL/PLATELET    EKG EKG Interpretation  Date/Time:  Monday January 02 2023 15:32:17 EDT Ventricular Rate:  75 PR Interval:  162 QRS Duration: 96 QT Interval:  439 QTC Calculation: 491 R Axis:   -19 Text Interpretation: Sinus rhythm Borderline left axis deviation L Confirmed by Lockie Mola, Lexington Krotz ([id number]) on 01/02/2023 4:50:02 PM  Radiology CT Head Wo Contrast  Result Date: 01/02/2023 CLINICAL DATA:  Headache EXAM: CT HEAD WITHOUT CONTRAST TECHNIQUE: Contiguous axial images were obtained from the base of the skull through the vertex without intravenous contrast. RADIATION DOSE REDUCTION: This exam was performed according to the departmental dose-optimization program which includes automated exposure control, adjustment of the mA and/or kV according to patient size and/or use of iterative reconstruction technique. COMPARISON:  CT brain 07/16/2021 FINDINGS: Brain: No evidence of acute infarction, hemorrhage, hydrocephalus, extra-axial collection or mass lesion/mass  effect. Vascular: No hyperdense vessels. Mild carotid vascular calcification Skull: Normal. Negative for fracture or focal lesion. Sinuses/Orbits: No acute finding. Other: None IMPRESSION: Negative non contrasted CT appearance of the brain. Electronically Signed   By: Jasmine Pang M.D.   On: 01/02/2023 18:20    Procedures Procedures    Medications Ordered in ED Medications  ondansetron (ZOFRAN-ODT) disintegrating tablet 4 mg (4 mg Oral Given 01/02/23 1533)  sodium chloride 0.9 % bolus 1,000 mL (1,000 mLs Intravenous New Bag/Given 01/02/23 1752)  meclizine (ANTIVERT) tablet 25 mg (25 mg Oral Given 01/02/23 1724)  acetaminophen (TYLENOL) tablet 650 mg (650 mg Oral Given 01/02/23 1724)    ED Course/ Medical Decision Making/ A&P                             Medical Decision Making Amount and/or Complexity of Data Reviewed Labs: ordered. Radiology: ordered.  Risk OTC drugs. Prescription drug management.   MORGHAN HELF is here with lightheadedness.  Normal vitals.  No fever.  EKG shows sinus rhythm per my review and interpretation.  No ischemic changes.  Differential diagnosis likely vertigo, neuroexam is normal have very low concern for stroke.  However will get head CT to evaluate for head bleed/brain mass, will give meclizine, fluid bolus, Tylenol, check basic labs.  Have no concern for cardiac or pulmonary process.  She has been able to ambulate.  No nystagmus.  She has been dealing with some inner ear issues.  She was follow-up with ENT.  Patient feeling better with IV fluids and meclizine.  CT scan per radiology reports unremarkable.  I do think that this is likely vertigo based.  She is able to ambulate without any issues.  Will prescribe meclizine and have her follow-up with ENT.  Discharged in good condition.  This chart was dictated using voice recognition software.  Despite best efforts to proofread,  errors can occur which can change the documentation meaning.          Final Clinical Impression(s) /  ED Diagnoses Final diagnoses:  Dizziness    Rx / DC Orders ED Discharge Orders          Ordered    meclizine (ANTIVERT) 25 MG tablet  2 times daily PRN        01/02/23 1838              Virgina Norfolk, DO 01/02/23 1838

## 2023-01-18 LAB — HM DIABETES EYE EXAM

## 2023-02-01 DIAGNOSIS — J320 Chronic maxillary sinusitis: Secondary | ICD-10-CM | POA: Diagnosis not present

## 2023-02-01 DIAGNOSIS — J321 Chronic frontal sinusitis: Secondary | ICD-10-CM | POA: Diagnosis not present

## 2023-02-01 DIAGNOSIS — R42 Dizziness and giddiness: Secondary | ICD-10-CM | POA: Diagnosis not present

## 2023-02-01 DIAGNOSIS — H903 Sensorineural hearing loss, bilateral: Secondary | ICD-10-CM | POA: Diagnosis not present

## 2023-02-01 DIAGNOSIS — H838X3 Other specified diseases of inner ear, bilateral: Secondary | ICD-10-CM | POA: Diagnosis not present

## 2023-02-15 ENCOUNTER — Encounter (INDEPENDENT_AMBULATORY_CARE_PROVIDER_SITE_OTHER): Payer: Self-pay

## 2023-02-17 ENCOUNTER — Other Ambulatory Visit: Payer: Self-pay | Admitting: Family Medicine

## 2023-02-21 DIAGNOSIS — R42 Dizziness and giddiness: Secondary | ICD-10-CM | POA: Diagnosis not present

## 2023-02-27 ENCOUNTER — Ambulatory Visit (INDEPENDENT_AMBULATORY_CARE_PROVIDER_SITE_OTHER): Payer: Medicare Other | Admitting: Internal Medicine

## 2023-02-27 ENCOUNTER — Encounter: Payer: Self-pay | Admitting: Internal Medicine

## 2023-02-27 VITALS — BP 130/74 | HR 82 | Ht 62.0 in | Wt 204.0 lb

## 2023-02-27 DIAGNOSIS — E890 Postprocedural hypothyroidism: Secondary | ICD-10-CM

## 2023-02-27 DIAGNOSIS — Z85850 Personal history of malignant neoplasm of thyroid: Secondary | ICD-10-CM | POA: Diagnosis not present

## 2023-02-27 NOTE — Patient Instructions (Signed)

## 2023-02-27 NOTE — Progress Notes (Unsigned)
Name: Monica Wallace  MRN/ DOB: [id number], 04-27-1951    Age/ Sex: 72 y.o., female     PCP: Bradd Canary, MD   Reason for Endocrinology Evaluation: Postoperative hypothyroidism     Initial Endocrinology Clinic Visit: 12/08/2020    PATIENT IDENTIFIER: Monica Wallace is a 72 y.o., female with a past medical history of T2DM, Dyslipidemia and Hypothyroidism. She has followed with [facility] since 12/08/2020 for consultative assistance with management of her postoperative hypothyroidism .    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    An ultrasound in 2002 showed MNG with a very large right thyoid nodule 3.6x2.3x1.7 cm    She is S/P total thyroidectomy in 2002 , for PTC ,encapsulated tumor, low grade     12/18/2020 S/P RAI remnant ablation 100 mCi of I-131.      12/28/2000: Posttreatment whole-body scan: Residual functioning thyroid tissue in the thyroid bed in the midline and extending towards the left. No distant metastases identified. 08/02/2006: Thyrogen stimulated I-123 whole body scan: Normal physiologic uptake in nares, salivary glands, gastrointestinal tract and urinary tract. No abnormal uptake indicating any functioning thyroid tissue.        Mother with thyroid disease , S/P total thyroidectomy due to benign nodule.        SUBJECTIVE:    Today (02/27/2023):  Monica Wallace is here for a follow up on hypothyroidism   Weight has been stable  Has IBS with alternating bowel movement  Denies local neck swelling  Denies  palpitations  Has rare  tremors    Levothyroxine 137 mcg daily except Saturday   HISTORY:  Past Medical History:  Past Medical History:  Diagnosis Date   Arthritis    Asthma    Baker's cyst of knee, right 02/16/2017   Cancer (HCC) 2002   thyroid (2002)   Cystic breast    Diabetes (HCC)    GERD  (gastroesophageal reflux disease)    Hepatic cyst 02/02/2015   Hyperlipidemia    Hypertension    Insomnia 07/23/2016   Menopause    Migraine    Palpitations    Preventative health care 01/18/2016   Sleep apnea 02/02/2015   Thyroid disease    Past Surgical History:  Past Surgical History:  Procedure Laterality Date   ABDOMINAL HYSTERECTOMY     took right ovary and uterus   APPENDECTOMY     CARPAL TUNNEL RELEASE Right    CESAREAN SECTION     CHOLECYSTECTOMY     EYE SURGERY Bilateral 2017   cataracts   KNEE ARTHROPLASTY     SHOULDER ARTHROSCOPY W/ ROTATOR CUFF REPAIR Left    THYROIDECTOMY  2002   Social History:  reports that she has never smoked. She has never used smokeless tobacco. She reports current alcohol use. She reports that she does not use drugs. Family History:  Family History  Problem Relation Age of Onset   Arthritis Mother    Transient ischemic attack Mother    Hypertension Mother    Hyperlipidemia Mother    Diabetes Father    Heart disease Father    Arthritis Father    Kidney disease Father    Hypertension Father    Hyperlipidemia Father    Cancer Sister 32       pancreatic   Fibromyalgia Sister    Allergic rhinitis Sister  Asthma Maternal Grandfather        smoker   Depression Maternal Grandfather    Dementia Paternal Grandmother    Diabetes Paternal Grandfather    Hypertension Brother    Allergic rhinitis Brother    Scoliosis Daughter        had rods placed and removed   Arthritis Sister    Gout Sister    GER disease Sister    Cancer Sister        breast cancer   Breast cancer Sister 58     HOME MEDICATIONS: Allergies as of 02/27/2023       Reactions   Levaquin [levofloxacin] Shortness Of Breath   Clindamycin/lincomycin Other (See Comments)   Pruritus No rash   Codeine Itching   Gabapentin Itching        Medication List        Accurate as of February 27, 2023  1:16 PM. If you have any questions, ask your nurse or doctor.           acetaminophen 325 MG tablet Commonly known as: TYLENOL Take 650 mg by mouth every 6 (six) hours as needed.   albuterol (2.5 MG/3ML) 0.083% nebulizer solution Commonly known as: PROVENTIL Take 3 mLs (2.5 mg total) by nebulization every 6 (six) hours as needed for wheezing or shortness of breath.   albuterol 108 (90 Base) MCG/ACT inhaler Commonly known as: VENTOLIN HFA Inhale 2 puffs into the lungs every 6 (six) hours as needed for wheezing or shortness of breath.   azelastine 0.1 % nasal spray Commonly known as: ASTELIN 1-2 sprays per nostril twice daily for runny nose   benzonatate 100 MG capsule Commonly known as: Tessalon Perles Take 2 capsules (200 mg total) by mouth 3 (three) times daily as needed for cough.   CVS VIT D 5000 HIGH-POTENCY PO Take 5,000 Units by mouth daily.   cyclobenzaprine 5 MG tablet Commonly known as: FLEXERIL Take 1 tablet (5 mg total) by mouth 3 (three) times daily as needed for muscle spasms.   estradiol 0.1 MG/GM vaginal cream Commonly known as: ESTRACE VAGINAL Place 1 Applicatorful vaginally at bedtime.   famotidine 40 MG tablet Commonly known as: PEPCID Take 1 tablet (40 mg total) by mouth at bedtime.   fluticasone 50 MCG/ACT nasal spray Commonly known as: FLONASE Place 2 sprays into both nostrils daily.   furosemide 20 MG tablet Commonly known as: LASIX 1 tab by mouth daily as needed for edema, weight gain >3lbs in 24 hours   hyoscyamine 0.125 MG SL tablet Commonly known as: LEVSIN SL DISSOLVE 1 TABLET(0.125 MG) UNDER THE TONGUE EVERY 4 HOURS AS NEEDED   ipratropium 0.03 % nasal spray Commonly known as: ATROVENT Place 2 sprays into both nostrils 2 (two) times daily as needed for rhinitis. Use less frequently if you become too dry.   levocetirizine 5 MG tablet Commonly known as: XYZAL TAKE 1 TABLET(5 MG) BY MOUTH EVERY EVENING   meclizine 25 MG tablet Commonly known as: ANTIVERT Take 1 tablet (25 mg total) by mouth  2 (two) times daily as needed for up to 20 doses for dizziness.   methylPREDNISolone 4 MG tablet Commonly known as: Medrol Standard 6 day taper   montelukast 10 MG tablet Commonly known as: SINGULAIR TAKE 1 TABLET(10 MG) BY MOUTH AT BEDTIME   nystatin cream Commonly known as: MYCOSTATIN APPLY TOPICALLY TO THE AFFECTED AREA TWICE DAILY   Olopatadine HCl 0.2 % Soln Place 1 drop in each eye once a  day as needed for itchy watery eyes   Omega-3 1000 MG Caps Take 1,000 mg by mouth daily.   omeprazole 40 MG capsule Commonly known as: PRILOSEC Take 1 capsule (40 mg total) by mouth daily as needed (reflux).   OneTouch Delica Plus Lancet33G Misc TEST BLOOD SUGAR ONCE A DAY   OneTouch Verio Flex System w/Device Kit Use to check blood glucose once a day.  DX code: E11.9   OneTouch Verio test strip Generic drug: glucose blood TEST BLOOD GLUCOSE ONCE A DAY   potassium chloride SA 20 MEQ tablet Commonly known as: KLOR-CON M TAKE 2 TABLETS BY MOUTH DAILY FOR 3 DAYS. THEN TAKE 1 TABLET BY MOUTH DAILY.   Pulmicort Flexhaler 180 MCG/ACT inhaler Generic drug: budesonide 2 puffs twice daily to prevent coughing or wheezing.   rosuvastatin 5 MG tablet Commonly known as: CRESTOR Take 1 tablet (5 mg total) by mouth 2 (two) times a week.   Synthroid 137 MCG tablet Generic drug: levothyroxine Take 1 tablet (137 mcg total) by mouth daily before breakfast.   triamterene-hydrochlorothiazide 37.5-25 MG tablet Commonly known as: MAXZIDE-25 Take 1 tablet by mouth daily.          OBJECTIVE:   PHYSICAL EXAM: VS:BP 130/74 (BP Location: Left Arm, Patient Position: Sitting, Cuff Size: Large)   Pulse 82   Ht 5\' 2"  (1.575 m)   Wt 204 lb (92.5 kg)   SpO2 97%   BMI 37.31 kg/m    EXAM: General: Pt appears well and is in NAD  Neck: General: Supple without adenopathy. Thyroid: No goiter or nodules appreciated.   Lungs: Clear with good BS bilat  Heart: Auscultation: RRR.  Extremities:   BL LE: No pretibial edema   Mental Status: Judgment, insight: Intact Orientation: Oriented to time, place, and person Memory: Intact for recent and remote events Mood and affect: No depression, anxiety, or agitation     DATA REVIEWED:   Latest Reference Range & Units 02/27/23 15:41  TSH 0.35 - 5.50 uIU/mL 0.82     Latest Reference Range & Units 06/08/21 16:29  TSH 0.35 - 5.50 uIU/mL 0.69  Thyroglobulin ng/mL <0.1 (L)  Thyroglobulin Ab < or = 1 IU/mL <1   Thyroid bed ultrasound 06/26/2021 No residual thyroid tissue is identified in the thyroidectomy bed.   Nonenlarged bilateral neck lymph nodes are again seen.   IMPRESSION: No evidence of recurrent or residual thyroid malignancy.  ASSESSMENT / PLAN / RECOMMENDATIONS:   Postsurgical Hypothyroidism :  -Patient is clinically and biochemically euthyroid - TSH at goal, no changes    -Medications : Continue Synthroid 137 MCG , 6 days out of the week   2. Hx pf PTC:   - TSH goal 0.5-2.0 uIU/mL  - She has excellent response to treatment - She is at low risk of recurrence - Tg and Tg antibody have been undetectable in the past, pending today -Thyroid bed ultrasound in December 2022 did not show any evidence of recurrence nor residual thyroid malignancy      F/U in 6 months   Signed electronically by: Lyndle Herrlich, MD  [facility]  [facility] [address] [place] [place], [place] [address] Phone: [phone] FAX: [phone]      CC: Bradd Canary, MD [address] [place] [address] Phone: [phone]  Fax: [phone]   Return to Endocrinology clinic as below: Future Appointments  Date Time Provider Department Center  02/27/2023  3:00 PM ,  Konrad Dolores, MD [facility] None  03/06/2023  3:20 PM Bradd Canary, MD [facility]  04/12/2023  4:00 PM Tomma Lightning, MD [facility] None  05/10/2023  3:30 PM Verlee Monte, MD  [facility] None

## 2023-02-28 DIAGNOSIS — H832X1 Labyrinthine dysfunction, right ear: Secondary | ICD-10-CM | POA: Diagnosis not present

## 2023-02-28 DIAGNOSIS — R42 Dizziness and giddiness: Secondary | ICD-10-CM | POA: Diagnosis not present

## 2023-02-28 MED ORDER — SYNTHROID 137 MCG PO TABS: 137.0000 ug | ORAL_TABLET | ORAL | 3 refills | Status: DC

## 2023-03-06 ENCOUNTER — Telehealth: Payer: Medicare Other | Admitting: Family Medicine

## 2023-03-06 DIAGNOSIS — J4530 Mild persistent asthma, uncomplicated: Secondary | ICD-10-CM

## 2023-03-06 DIAGNOSIS — M79641 Pain in right hand: Secondary | ICD-10-CM

## 2023-03-06 DIAGNOSIS — E7800 Pure hypercholesterolemia, unspecified: Secondary | ICD-10-CM

## 2023-03-06 DIAGNOSIS — I10 Essential (primary) hypertension: Secondary | ICD-10-CM | POA: Diagnosis not present

## 2023-03-06 DIAGNOSIS — E119 Type 2 diabetes mellitus without complications: Secondary | ICD-10-CM | POA: Diagnosis not present

## 2023-03-06 DIAGNOSIS — R3915 Urgency of urination: Secondary | ICD-10-CM

## 2023-03-06 DIAGNOSIS — E890 Postprocedural hypothyroidism: Secondary | ICD-10-CM

## 2023-03-06 MED ORDER — ENALAPRIL MALEATE 2.5 MG PO TABS
2.5000 mg | ORAL_TABLET | Freq: Every day | ORAL | 3 refills | Status: DC
Start: 2023-03-06 — End: 2023-08-21

## 2023-03-06 NOTE — Assessment & Plan Note (Signed)
No recent exacerbation, no changes 

## 2023-03-06 NOTE — Assessment & Plan Note (Signed)
Encourage heart healthy diet such as MIND or DASH diet, increase exercise, avoid trans fats, simple carbohydrates and processed foods, consider a krill or fish or flaxseed oil cap daily.  Tolerating Rosuvastatin 

## 2023-03-06 NOTE — Assessment & Plan Note (Signed)
hgba1c acceptable, minimize simple carbs. Increase exercise as tolerated. Continue current meds 

## 2023-03-06 NOTE — Assessment & Plan Note (Signed)
Supplement and monitor 

## 2023-03-06 NOTE — Assessment & Plan Note (Signed)
Well controlled, no changes to meds. Encouraged heart healthy diet such as the DASH diet and exercise as tolerated.  °

## 2023-03-06 NOTE — Progress Notes (Signed)
MyChart Video Visit    Virtual Visit via Video Note   This patient is at least at moderate risk for complications without adequate follow up. This format is felt to be most appropriate for this patient at this time. Physical exam was limited by quality of the video and audio technology used for the visit. Shamaine, CMA was able to get the patient set up on a video visit.  Patient location: home Patient and provider in visit Provider location: Office  I discussed the limitations of evaluation and management by telemedicine and the availability of in person appointments. The patient expressed understanding and agreed to proceed.  Visit Date: 03/06/2023  Today's healthcare provider: Danise Edge, MD     Subjective:    Patient ID: Monica Wallace, female    DOB: 06-Jan-1951, 72 y.o.   MRN: [id number]  Chief Complaint  Patient presents with   Follow-up    Follow up    HPI Discussed the use of AI scribe software for clinical note transcription with the patient, who gave verbal consent to proceed.  History of Present Illness   The patient, with a history of hypertension and diabetes, presents with multiple concerns. The primary concern is an ongoing issue with imbalance. The patient has been evaluated by an ENT specialist who identified a problem with the right ear. The patient is awaiting vestibular rehabilitation for this issue.  The patient also reports right-sided weakness, which has been a persistent issue. The patient mentions some numbness in the foot and a lack of full strength recovery. The patient is able to walk but mentions difficulty when the knee starts to bother them.  The patient is also experiencing arthritis in the right hand, specifically in the little and middle fingers. The patient reports difficulty in bending the fingers and pain, which is affecting their ability to write.  The patient also mentions potential bladder issues, which they believe may have been  exacerbated by a recent sinus issue and persistent cough. The patient tested negative for COVID-19 at home.  The patient also mentions concerns about their hypertension medication, Maxzide, and believes they may need to return to their old dose due to episodes of edema.        Past Medical History:  Diagnosis Date   Arthritis    Asthma    Baker's cyst of knee, right 02/16/2017   Cancer (HCC) 2002   thyroid (2002)   Cystic breast    Diabetes (HCC)    GERD (gastroesophageal reflux disease)    Hepatic cyst 02/02/2015   Hyperlipidemia    Hypertension    Insomnia 07/23/2016   Menopause    Migraine    Palpitations    Preventative health care 01/18/2016   Sleep apnea 02/02/2015   Thyroid disease     Past Surgical History:  Procedure Laterality Date   ABDOMINAL HYSTERECTOMY     took right ovary and uterus   APPENDECTOMY     CARPAL TUNNEL RELEASE Right    CESAREAN SECTION     CHOLECYSTECTOMY     EYE SURGERY Bilateral 2017   cataracts   KNEE ARTHROPLASTY     SHOULDER ARTHROSCOPY W/ ROTATOR CUFF REPAIR Left    THYROIDECTOMY  2002    Family History  Problem Relation Age of Onset   Arthritis Mother    Transient ischemic attack Mother    Hypertension Mother    Hyperlipidemia Mother    Diabetes Father    Heart disease Father  Arthritis Father    Kidney disease Father    Hypertension Father    Hyperlipidemia Father    Cancer Sister 60       pancreatic   Fibromyalgia Sister    Allergic rhinitis Sister    Asthma Maternal Grandfather        smoker   Depression Maternal Grandfather    Dementia Paternal Grandmother    Diabetes Paternal Grandfather    Hypertension Brother    Allergic rhinitis Brother    Scoliosis Daughter        had rods placed and removed   Arthritis Sister    Gout Sister    GER disease Sister    Cancer Sister        breast cancer   Breast cancer Sister 42    Social History   Socioeconomic History   Marital status: Divorced    Spouse  name: Not on file   Number of children: 2   Years of education: Not on file   Highest education level: Not on file  Occupational History   Not on file  Tobacco Use   Smoking status: Never   Smokeless tobacco: Never  Vaping Use   Vaping status: Never Used  Substance and Sexual Activity   Alcohol use: Yes    Alcohol/week: 0.0 standard drinks of alcohol    Comment: very rare once or twice a year   Drug use: No   Sexual activity: Yes    Birth control/protection: Other-see comments, Surgical    Comment:  boyfriend  Other Topics Concern   Not on file  Social History Narrative   Household: pt, daughter and sister    Social Determinants of Health   Financial Resource Strain: Medium Risk (11/23/2021)   Overall Financial Resource Strain (CARDIA)    Difficulty of Paying Living Expenses: Somewhat hard  Food Insecurity: No Food Insecurity (04/04/2022)   Hunger Vital Sign    Worried About Running Out of Food in the Last Year: Never true    Ran Out of Food in the Last Year: Never true  Transportation Needs: No Transportation Needs (04/04/2022)   PRAPARE - Administrator, Civil Service (Medical): No    Lack of Transportation (Non-Medical): No  Physical Activity: Inactive (02/08/2021)   Exercise Vital Sign    Days of Exercise per Week: 0 days    Minutes of Exercise per Session: 0 min  Stress: No Stress Concern Present (04/04/2022)   [employer] of Occupational Health - Occupational Stress Questionnaire    Feeling of Stress : Not at all  Social Connections: Moderately Isolated (02/08/2021)   Social Connection and Isolation Panel [NHANES]    Frequency of Communication with Friends and Family: More than three times a week    Frequency of Social Gatherings with Friends and Family: More than three times a week    Attends Religious Services: More than 4 times per year    Active Member of [employer] or Organizations: No    Attends Banker Meetings: Never    Marital  Status: Divorced  Catering manager Violence: Not At Risk (04/04/2022)   Humiliation, Afraid, Rape, and Kick questionnaire    Fear of Current or Ex-Partner: No    Emotionally Abused: No    Physically Abused: No    Sexually Abused: No    Outpatient Medications Prior to Visit  Medication Sig Dispense Refill   acetaminophen (TYLENOL) 325 MG tablet Take 650 mg by mouth every 6 (six) hours as  needed.     albuterol (PROVENTIL) (2.5 MG/3ML) 0.083% nebulizer solution Take 3 mLs (2.5 mg total) by nebulization every 6 (six) hours as needed for wheezing or shortness of breath. 75 mL 0   albuterol (VENTOLIN HFA) 108 (90 Base) MCG/ACT inhaler Inhale 2 puffs into the lungs every 6 (six) hours as needed for wheezing or shortness of breath. 18 g 1   azelastine (ASTELIN) 0.1 % nasal spray 1-2 sprays per nostril twice daily for runny nose 30 mL 5   benzonatate (TESSALON PERLES) 100 MG capsule Take 2 capsules (200 mg total) by mouth 3 (three) times daily as needed for cough. 30 capsule 0   Blood Glucose Monitoring Suppl (ONETOUCH VERIO FLEX SYSTEM) w/Device KIT Use to check blood glucose once a day.  DX code: E11.9 1 kit 0   budesonide (PULMICORT FLEXHALER) 180 MCG/ACT inhaler 2 puffs twice daily to prevent coughing or wheezing. 1 each 5   Cholecalciferol (CVS VIT D 5000 HIGH-POTENCY PO) Take 5,000 Units by mouth daily.     cyclobenzaprine (FLEXERIL) 5 MG tablet Take 1 tablet (5 mg total) by mouth 3 (three) times daily as needed for muscle spasms. 30 tablet 1   estradiol (ESTRACE VAGINAL) 0.1 MG/GM vaginal cream Place 1 Applicatorful vaginally at bedtime. 42.5 g 12   famotidine (PEPCID) 40 MG tablet Take 1 tablet (40 mg total) by mouth at bedtime. 40 tablet 3   fluticasone (FLONASE) 50 MCG/ACT nasal spray Place 2 sprays into both nostrils daily. 16 g 5   furosemide (LASIX) 20 MG tablet 1 tab by mouth daily as needed for edema, weight gain >3lbs in 24 hours 30 tablet 3   glucose blood (ONETOUCH VERIO) test strip  TEST BLOOD GLUCOSE ONCE A DAY 100 strip 1   hyoscyamine (LEVSIN SL) 0.125 MG SL tablet DISSOLVE 1 TABLET(0.125 MG) UNDER THE TONGUE EVERY 4 HOURS AS NEEDED 30 tablet 1   ipratropium (ATROVENT) 0.03 % nasal spray Place 2 sprays into both nostrils 2 (two) times daily as needed for rhinitis. Use less frequently if you become too dry. 30 mL 4   Lancets (ONETOUCH DELICA PLUS LANCET33G) MISC TEST BLOOD SUGAR ONCE A DAY 100 each 1   levocetirizine (XYZAL) 5 MG tablet TAKE 1 TABLET(5 MG) BY MOUTH EVERY EVENING 30 tablet 5   meclizine (ANTIVERT) 25 MG tablet Take 1 tablet (25 mg total) by mouth 2 (two) times daily as needed for up to 20 doses for dizziness. 20 tablet 0   montelukast (SINGULAIR) 10 MG tablet TAKE 1 TABLET(10 MG) BY MOUTH AT BEDTIME 90 tablet 1   nystatin cream (MYCOSTATIN) APPLY TOPICALLY TO THE AFFECTED AREA TWICE DAILY 30 g 0   Olopatadine HCl 0.2 % SOLN Place 1 drop in each eye once a day as needed for itchy watery eyes 2.5 mL 5   Omega-3 1000 MG CAPS Take 1,000 mg by mouth daily.     omeprazole (PRILOSEC) 40 MG capsule Take 1 capsule (40 mg total) by mouth daily as needed (reflux). 30 capsule 5   potassium chloride SA (KLOR-CON M) 20 MEQ tablet TAKE 2 TABLETS BY MOUTH DAILY FOR 3 DAYS. THEN TAKE 1 TABLET BY MOUTH DAILY. 30 tablet 3   rosuvastatin (CRESTOR) 5 MG tablet Take 1 tablet (5 mg total) by mouth 2 (two) times a week. 25 tablet 1   SYNTHROID 137 MCG tablet Take 1 tablet (137 mcg total) by mouth as directed. 1 tablet 6 days out of the week 78 tablet 3  triamterene-hydrochlorothiazide (MAXZIDE-25) 37.5-25 MG tablet Take 1 tablet by mouth daily. 90 tablet 0   No facility-administered medications prior to visit.    Allergies  Allergen Reactions   Levaquin [Levofloxacin] Shortness Of Breath   Clindamycin/Lincomycin Other (See Comments)    Pruritus No rash   Codeine Itching   Gabapentin Itching    Review of Systems  Constitutional:  Positive for malaise/fatigue. Negative  for fever.  HENT:  Negative for congestion.   Eyes:  Negative for blurred vision.  Respiratory:  Positive for cough. Negative for shortness of breath.   Cardiovascular:  Negative for chest pain, palpitations and leg swelling.  Gastrointestinal:  Negative for abdominal pain, blood in stool and nausea.  Genitourinary:  Negative for dysuria and frequency.  Musculoskeletal:  Positive for back pain and joint pain. Negative for falls.  Skin:  Negative for rash.  Neurological:  Positive for dizziness and focal weakness. Negative for loss of consciousness and headaches.  Endo/Heme/Allergies:  Negative for environmental allergies.  Psychiatric/Behavioral:  Negative for depression. The patient is not nervous/anxious.        Objective:    Physical Exam Constitutional:      General: She is not in acute distress.    Appearance: Normal appearance. She is not ill-appearing or toxic-appearing.  HENT:     Head: Normocephalic and atraumatic.     Right Ear: External ear normal.     Left Ear: External ear normal.     Nose: Nose normal.  Eyes:     General:        Right eye: No discharge.        Left eye: No discharge.  Pulmonary:     Effort: Pulmonary effort is normal.  Skin:    Findings: No rash.  Neurological:     Mental Status: She is alert and oriented to person, place, and time.  Psychiatric:        Behavior: Behavior normal.    There were no vitals taken for this visit. Wt Readings from Last 3 Encounters:  02/27/23 204 lb (92.5 kg)  01/02/23 200 lb 12.8 oz (91.1 kg)  12/21/22 204 lb (92.5 kg)       Assessment & Plan:  Postoperative hypothyroidism Assessment & Plan: Supplement and monitor   Mild persistent asthma without complication Assessment & Plan: No recent exacerbation, no changes   Pure hypercholesterolemia Assessment & Plan: Encourage heart healthy diet such as MIND or DASH diet, increase exercise, avoid trans fats, simple carbohydrates and processed foods,  consider a krill or fish or flaxseed oil cap daily. Tolerating Rosuvastatin  Orders: -     Lipid panel; Future  Primary hypertension Assessment & Plan: Well controlled, no changes to meds. Encouraged heart healthy diet such as the DASH diet and exercise as tolerated.    Orders: -     Comprehensive metabolic panel; Future -     CBC with Differential/Platelet; Future  Controlled type 2 diabetes mellitus without complication, without long-term current use of insulin (HCC) Assessment & Plan: hgba1c acceptable, minimize simple carbs. Increase exercise as tolerated. Continue current meds   Hand pain, right -     Rheumatoid factor; Future -     ANA; Future -     Uric acid; Future -     Sedimentation rate; Future  Urinary urgency -     Urinalysis, Routine w reflex microscopic; Future -     Urine Culture; Future  Other orders -     Enalapril Maleate; Take 1  tablet (2.5 mg total) by mouth daily.  Dispense: 30 tablet; Refill: 3     Assessment and Plan    Vestibular Dysfunction Right ear vestibular dysfunction identified on testing. Awaiting vestibular rehabilitation. -Coordinate with ENT and neurology for vestibular rehabilitation scheduling.  Arthritis Pain and stiffness in right hand, particularly the fifth and middle fingers. 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 Family history of arthritis. -Order blood work to rule out rheumatologic conditions. -Advise use of a stress ball for hand exercises.  Hypertension Blood pressure slightly elevated. Currently on Maxzide 37.5/25mg . -Start Enalapril 2.5mg  daily for additional blood pressure control and kidney protection. -Check blood pressure in 3-4 weeks.  Diabetes Managed by endocrinologist. Hemoglobin A1c to be monitored. -Continue current management.  Edema Episodes of edema managed with Lasix as needed. -Continue current management.  Potential Bladder Issues Recent symptoms possibly related to  coughing from sinus issues. -Order urine culture to rule out infection.  General Health Maintenance / Followup Plans -Complete blood work and urine culture on 03/07/2023. -Check blood pressure and complete CMP in 3-4 weeks. -Follow-up visit in 4 months.         I discussed the assessment and treatment plan with the patient. The patient was provided an opportunity to ask questions and all were answered. The patient agreed with the plan and demonstrated an understanding of the instructions.   The patient was advised to call back or seek an in-person evaluation if the symptoms worsen or if the condition fails to improve as anticipated.  Danise Edge, MD [facility] [phone] (phone) [phone] (fax)  [facility]

## 2023-03-15 ENCOUNTER — Other Ambulatory Visit (INDEPENDENT_AMBULATORY_CARE_PROVIDER_SITE_OTHER): Payer: Medicare Other

## 2023-03-15 ENCOUNTER — Other Ambulatory Visit: Payer: Medicare Other

## 2023-03-15 DIAGNOSIS — R3915 Urgency of urination: Secondary | ICD-10-CM | POA: Diagnosis not present

## 2023-03-15 DIAGNOSIS — I10 Essential (primary) hypertension: Secondary | ICD-10-CM

## 2023-03-15 DIAGNOSIS — E7800 Pure hypercholesterolemia, unspecified: Secondary | ICD-10-CM | POA: Diagnosis not present

## 2023-03-15 DIAGNOSIS — M79641 Pain in right hand: Secondary | ICD-10-CM | POA: Diagnosis not present

## 2023-03-15 LAB — LIPID PANEL
Cholesterol: 171 mg/dL (ref 0–200)
HDL: 49.5 mg/dL (ref >39.00)
LDL Cholesterol: 93 mg/dL (ref 0–99)
NonHDL: 121.13
Total CHOL/HDL Ratio: 3
Triglycerides: 141 mg/dL (ref 0.0–149.0)
VLDL: 28.2 mg/dL (ref 0.0–40.0)

## 2023-03-15 LAB — COMPREHENSIVE METABOLIC PANEL
ALT: 17 U/L (ref 0–35)
AST: 20 U/L (ref 0–37)
Albumin: 3.9 g/dL (ref 3.5–5.2)
Alkaline Phosphatase: 73 U/L (ref 39–117)
BUN: 14 mg/dL (ref 6–23)
CO2: 29 mEq/L (ref 19–32)
Calcium: 9.6 mg/dL (ref 8.4–10.5)
Chloride: 100 mEq/L (ref 96–112)
Creatinine, Ser: 0.86 mg/dL (ref 0.40–1.20)
GFR: 66.72 mL/min (ref >60.00)
Glucose, Bld: 106 mg/dL — ABNORMAL HIGH (ref 70–99)
Potassium: 3.9 mEq/L (ref 3.5–5.1)
Sodium: 140 mEq/L (ref 135–145)
Total Bilirubin: 0.4 mg/dL (ref 0.2–1.2)
Total Protein: 6.9 g/dL (ref 6.0–8.3)

## 2023-03-15 LAB — URIC ACID: Uric Acid, Serum: 5.9 mg/dL (ref 2.4–7.0)

## 2023-03-15 LAB — URINALYSIS, ROUTINE W REFLEX MICROSCOPIC
Bilirubin Urine: NEGATIVE
Hgb urine dipstick: NEGATIVE
Ketones, ur: NEGATIVE
Leukocytes,Ua: NEGATIVE
Nitrite: NEGATIVE
RBC / HPF: NONE SEEN (ref 0–?)
Specific Gravity, Urine: 1.015 (ref 1.000–1.030)
Total Protein, Urine: NEGATIVE
Urine Glucose: NEGATIVE
Urobilinogen, UA: 0.2 (ref 0.0–1.0)
WBC, UA: NONE SEEN (ref 0–?)
pH: 7.5 (ref 5.0–8.0)

## 2023-03-15 LAB — CBC WITH DIFFERENTIAL/PLATELET
Basophils Absolute: 0 10*3/uL (ref 0.0–0.1)
Basophils Relative: 0.5 % (ref 0.0–3.0)
Eosinophils Absolute: 0.1 10*3/uL (ref 0.0–0.7)
Eosinophils Relative: 1.6 % (ref 0.0–5.0)
HCT: 42 % (ref 36.0–46.0)
Hemoglobin: 13.9 g/dL (ref 12.0–15.0)
Lymphocytes Relative: 34.5 % (ref 12.0–46.0)
Lymphs Abs: 2.8 10*3/uL (ref 0.7–4.0)
MCHC: 33.1 g/dL (ref 30.0–36.0)
MCV: 92.4 fl (ref 78.0–100.0)
Monocytes Absolute: 0.6 10*3/uL (ref 0.1–1.0)
Monocytes Relative: 7.1 % (ref 3.0–12.0)
Neutro Abs: 4.5 10*3/uL (ref 1.4–7.7)
Neutrophils Relative %: 56.3 % (ref 43.0–77.0)
Platelets: 316 10*3/uL (ref 150.0–400.0)
RBC: 4.54 Mil/uL (ref 3.87–5.11)
RDW: 13.2 % (ref 11.5–15.5)
WBC: 8.1 10*3/uL (ref 4.0–10.5)

## 2023-03-15 LAB — SEDIMENTATION RATE: Sed Rate: 55 mm/hr — ABNORMAL HIGH (ref 0–30)

## 2023-03-16 LAB — URINE CULTURE
MICRO NUMBER:: 15393666
Result:: NO GROWTH
SPECIMEN QUALITY:: ADEQUATE

## 2023-03-19 LAB — ANTI-NUCLEAR AB-TITER (ANA TITER)
ANA TITER: 1:40 {titer} — ABNORMAL HIGH
ANA TITER: 1:40 {titer} — ABNORMAL HIGH
ANA Titer 1: 1:40 {titer} — ABNORMAL HIGH

## 2023-03-19 LAB — ANA: Anti Nuclear Antibody (ANA): POSITIVE — AB

## 2023-03-19 LAB — RHEUMATOID FACTOR: Rheumatoid fact SerPl-aCnc: 10 [IU]/mL (ref <14)

## 2023-03-21 ENCOUNTER — Other Ambulatory Visit: Payer: Self-pay | Admitting: Family Medicine

## 2023-03-21 DIAGNOSIS — M2550 Pain in unspecified joint: Secondary | ICD-10-CM

## 2023-03-21 DIAGNOSIS — R768 Other specified abnormal immunological findings in serum: Secondary | ICD-10-CM

## 2023-03-31 ENCOUNTER — Other Ambulatory Visit: Payer: Self-pay | Admitting: Family Medicine

## 2023-04-12 ENCOUNTER — Ambulatory Visit: Payer: Medicare Other | Admitting: Pulmonary Disease

## 2023-04-13 ENCOUNTER — Ambulatory Visit: Payer: Medicare Other | Admitting: *Deleted

## 2023-04-13 ENCOUNTER — Ambulatory Visit: Payer: Medicare Other | Attending: Otolaryngology | Admitting: Physical Therapy

## 2023-04-13 ENCOUNTER — Encounter: Payer: Self-pay | Admitting: Physical Therapy

## 2023-04-13 DIAGNOSIS — M79641 Pain in right hand: Secondary | ICD-10-CM | POA: Diagnosis not present

## 2023-04-13 DIAGNOSIS — R2681 Unsteadiness on feet: Secondary | ICD-10-CM | POA: Insufficient documentation

## 2023-04-13 DIAGNOSIS — Z6837 Body mass index (BMI) 37.0-37.9, adult: Secondary | ICD-10-CM | POA: Diagnosis not present

## 2023-04-13 DIAGNOSIS — R42 Dizziness and giddiness: Secondary | ICD-10-CM | POA: Diagnosis not present

## 2023-04-13 DIAGNOSIS — E669 Obesity, unspecified: Secondary | ICD-10-CM | POA: Diagnosis not present

## 2023-04-13 DIAGNOSIS — R768 Other specified abnormal immunological findings in serum: Secondary | ICD-10-CM | POA: Diagnosis not present

## 2023-04-13 DIAGNOSIS — Z0000 Encounter for general adult medical examination without abnormal findings: Secondary | ICD-10-CM

## 2023-04-13 DIAGNOSIS — R5383 Other fatigue: Secondary | ICD-10-CM | POA: Diagnosis not present

## 2023-04-13 DIAGNOSIS — M25561 Pain in right knee: Secondary | ICD-10-CM | POA: Diagnosis not present

## 2023-04-13 DIAGNOSIS — M1991 Primary osteoarthritis, unspecified site: Secondary | ICD-10-CM | POA: Diagnosis not present

## 2023-04-13 DIAGNOSIS — M79642 Pain in left hand: Secondary | ICD-10-CM | POA: Diagnosis not present

## 2023-04-13 NOTE — Therapy (Signed)
OUTPATIENT PHYSICAL THERAPY VESTIBULAR EVALUATION     Patient Name: Monica Wallace MRN: [id number] DOB:09-01-1950, 72 y.o., female Today's Date: 04/13/2023  END OF SESSION:  PT End of Session - 04/13/23 1535     Visit Number 1    Date for PT Re-Evaluation 07/13/23    Authorization Type [employer]    PT Start Time 1530    PT Stop Time 1615    PT Time Calculation (min) 45 min    Activity Tolerance Patient tolerated treatment well    Behavior During Therapy WFL for tasks assessed/performed             Past Medical History:  Diagnosis Date   Allergy History of food and drug   Arthritis    Asthma    Baker's cyst of knee, right 02/16/2017   Cancer (HCC) 2002   thyroid (2002)   Cataract History surgery 2017   Cystic breast    Diabetes (HCC)    GERD (gastroesophageal reflux disease)    Hepatic cyst 02/02/2015   Hyperlipidemia    Hypertension    Insomnia 07/23/2016   Menopause    Migraine    Palpitations    Preventative health care 01/18/2016   Sleep apnea 02/02/2015   Thyroid disease    Past Surgical History:  Procedure Laterality Date   ABDOMINAL HYSTERECTOMY     took right ovary and uterus   APPENDECTOMY     CARPAL TUNNEL RELEASE Right    CESAREAN SECTION     CHOLECYSTECTOMY     EYE SURGERY Bilateral 2017   cataracts   KNEE ARTHROPLASTY     SHOULDER ARTHROSCOPY W/ ROTATOR CUFF REPAIR Left    THYROIDECTOMY  2002   TUBAL LIGATION     Patient Active Problem List   Diagnosis Date Noted   Mild persistent asthma without complication 04/22/2022   Dysfunction of both eustachian tubes 04/22/2022   UTI symptoms 04/07/2022   Hx of papillary thyroid carcinoma 02/28/2022   Left ankle pain 02/09/2022   Dizziness 02/08/2022   Edema 02/08/2022   Unsteady gait 11/14/2021   Lumbar radiculopathy 09/09/2021   Cervical cancer screening 03/07/2021   Sinusitis 10/30/2020   Allergic conjunctivitis 04/08/2020   Neck pain 02/06/2020   Right knee pain 09/30/2019   COVID-19  09/30/2019   Educated about COVID-19 virus infection 12/05/2018   Baker's cyst of knee, right 02/16/2017   Insomnia 07/23/2016   Hypokalemia 07/23/2016   Controlled type 2 diabetes mellitus without complication, without long-term current use of insulin (HCC) 01/24/2016   Preventative health care 01/18/2016   Left-sided low back pain with left-sided sciatica 10/07/2015   Obesity (BMI 30-39.9) 06/05/2015   OSA (obstructive sleep apnea) 02/02/2015   Palpitations 09/19/2014   Plantar fasciitis, bilateral 11/19/2013   Pronation deformity of ankle, acquired 11/19/2013   DJD (degenerative joint disease) 08/26/2013   Fibrocystic breast 08/26/2013   HTN (hypertension) 08/26/2013   GERD (gastroesophageal reflux disease) 08/26/2013   S/P hysterectomy with oophorectomy 08/26/2013   Hypothyroidism 08/26/2013   Chronic hoarseness 08/26/2013   Colon polyp 08/26/2013   Hiatal hernia 08/26/2013   Irritable bowel syndrome 08/26/2013   Allergic rhinoconjunctivitis 08/26/2013   Hyperlipidemia 08/22/2013   Migraine 08/22/2013   Thyroid cancer (HCC) 08/22/2013    PCP: Abner Greenspan, MD REFERRING PROVIDER: Suszanne Conners, MD  REFERRING DIAG: Dizziness  THERAPY DIAG:  Dizziness and giddiness  Unsteadiness on feet  ONSET DATE: 02/24/23  Rationale for Evaluation and Treatment: Rehabilitation  SUBJECTIVE:   SUBJECTIVE STATEMENT: Reports that  back in July she woke up the room was moving and the stuff she was looking at was moving side to side, MD feels that I have vertigo in my right ear, reports that weather patterns change her dizziness  Pt accompanied by: self  PERTINENT HISTORY: see above  PAIN:  Are you having pain? Yes: NPRS scale: 4/10 Pain location: right knee Pain description: ache/sore Aggravating factors: walking Relieving factors: got injection today  PRECAUTIONS: None  RED FLAGS: None   WEIGHT BEARING RESTRICTIONS: No  FALLS: Has patient fallen in last 6 months? 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, does some lifting  PATIENT GOALS: have less dizziness  OBJECTIVE:   DIAGNOSTIC FINDINGS: none  COGNITION: Overall cognitive status: Impaired   SENSATION: WFL  POSTURE:  rounded shoulders and forward head  Cervical ROM:  tight and sore  Active A/PROM (deg) eval  Flexion WNL's  Extension Decreased 100%  Right lateral flexion   Left lateral flexion   Right rotation Decreased 75%  Left rotation Decreased 75%  (Blank rows = not tested)  STRENGTH: Good   GAIT: Gait pattern:  mild limp on the right Distance walked: 50' Assistive device utilized: None Level of assistance: Complete Independence  VESTIBULAR ASSESSMENT:   SYMPTOM BEHAVIOR:  Subjective history: see above  Non-Vestibular symptoms: neck pain  Type of dizziness: Spinning/Vertigo and "World moves"  Frequency: 2x  Duration: 1-2 days  Aggravating factors: Induced by motion: turning body quickly and turning head quickly  Relieving factors: dark room, closing eyes, and rest  Progression of symptoms: better  OCULOMOTOR EXAM:  Ocular Alignment: normal  Ocular ROM:  good  Spontaneous Nystagmus: absent  Gaze-Induced Nystagmus: absent  Smooth Pursuits: intact  Saccades: intact VESTIBULAR - OCULAR REFLEX:   Slow VOR: Normal  VOR Cancellation: Unable to Maintain Gaze    POSITIONAL TESTING: Right Dix-Hallpike: no nystagmus Left Dix-Hallpike: no nystagmus    OTHOSTATICS: not done   VESTIBULAR TREATMENT:                                                                                                   DATE:   Canalith Repositioning:  Comment: Dix Halpike negative today  bilaterally Gaze Adaptation:   Habituation:     PATIENT EDUCATION: Education details: HEP/POC Person educated:  Patient Education method: Programmer, multimedia, Facilities manager, Verbal cues, and Handouts Education comprehension: verbalized understanding  HOME EXERCISE PROGRAM:  GOALS: Goals reviewed with patient? Yes  SHORT TERM GOALS: Target date: 05/04/23  Independent with initial HEP Goal status: INITIAL  LONG TERM GOALS: Target date: 07/13/23  Independent with advanced HEP Goal status: INITIAL  2.  Report no issues with vertigo over a 4 week period Goal status: INITIAL  3.  Understand self treatment for BPPV Goal status: INITIAL  ASSESSMENT:  CLINICAL IMPRESSION: Patient is a 72 y.o. female who was seen today for physical therapy evaluation and treatment for dizziness.  Reports about a month ago she had vertigo and her description of it sounds like true right side BPPV, I performed dix halpike on both sides today and they were negative, gave HEP for VOR   OBJECTIVE IMPAIRMENTS: decreased activity tolerance, decreased balance, decreased coordination, decreased endurance, decreased mobility, difficulty walking, decreased strength, dizziness, increased muscle spasms, improper body mechanics, postural dysfunction, and pain.   REHAB POTENTIAL: Good  CLINICAL DECISION MAKING: Stable/uncomplicated  EVALUATION COMPLEXITY: Low   PLAN:  PT FREQUENCY: 1-2x/week  PT DURATION: 12 weeks  PLANNED INTERVENTIONS: Therapeutic exercises, Therapeutic activity, Neuromuscular re-education, Balance training, Gait training, Patient/Family education, Self Care, Joint mobilization, Vestibular training, Canalith repositioning, Visual/preceptual remediation/compensation, Dry Needling, Electrical stimulation, Moist heat, Traction, and Manual therapy  PLAN FOR NEXT SESSION: could reassess for canalith repositioning, continue VOR she does have a lot of tightness and spasms in the neck we could address as well   Jearld Lesch, PT 04/13/2023, 3:36 PM

## 2023-04-13 NOTE — Progress Notes (Signed)
Subjective:   Monica Wallace is a 72 y.o. female who presents for Medicare Annual (Subsequent) preventive examination.  Visit Complete: Virtual  I connected with  Monica Wallace on 04/13/23 by a audio enabled telemedicine application and verified that I am speaking with the correct person using two identifiers.  Patient Location: Home  Provider Location: Office/Clinic  I discussed the limitations of evaluation and management by telemedicine. The patient expressed understanding and agreed to proceed.  Patient Medicare AWV questionnaire was completed by the patient on 04/08/23; I have confirmed that all information answered by patient is correct and no changes since this date.  Cardiac Risk Factors include: advanced age (>10men, >55 women);diabetes mellitus;dyslipidemia;hypertension     Objective:    Because this visit was a virtual/telehealth visit, some criteria may be missing or patient reported. Any vitals not documented were not able to be obtained and vitals that have been documented are patient reported.       04/13/2023    1:13 PM 01/02/2023    3:29 PM 09/14/2022    5:14 PM 04/04/2022    3:18 PM 02/13/2022    4:27 PM 11/24/2021    5:49 PM 11/04/2021    6:44 PM  Advanced Directives  Does Patient Have a Medical Advance Directive? No No No No No No No  Would patient like information on creating a medical advance directive? No - Patient declined  No - Patient declined   No - Patient declined No - Patient declined    Current Medications (verified) Outpatient Encounter Medications as of 04/13/2023  Medication Sig   acetaminophen (TYLENOL) 325 MG tablet Take 650 mg by mouth every 6 (six) hours as needed.   albuterol (PROVENTIL) (2.5 MG/3ML) 0.083% nebulizer solution Take 3 mLs (2.5 mg total) by nebulization every 6 (six) hours as needed for wheezing or shortness of breath.   albuterol (VENTOLIN HFA) 108 (90 Base) MCG/ACT inhaler Inhale 2 puffs into the lungs every 6 (six) hours as  needed for wheezing or shortness of breath.   azelastine (ASTELIN) 0.1 % nasal spray 1-2 sprays per nostril twice daily for runny nose   benzonatate (TESSALON PERLES) 100 MG capsule Take 2 capsules (200 mg total) by mouth 3 (three) times daily as needed for cough.   Blood Glucose Monitoring Suppl (ONETOUCH VERIO FLEX SYSTEM) w/Device KIT Use to check blood glucose once a day.  DX code: E11.9   budesonide (PULMICORT FLEXHALER) 180 MCG/ACT inhaler 2 puffs twice daily to prevent coughing or wheezing.   Cholecalciferol ([employer] VIT D 5000 HIGH-POTENCY PO) Take 5,000 Units by mouth daily.   cyclobenzaprine (FLEXERIL) 5 MG tablet Take 1 tablet (5 mg total) by mouth 3 (three) times daily as needed for muscle spasms.   enalapril (VASOTEC) 2.5 MG tablet Take 1 tablet (2.5 mg total) by mouth daily.   estradiol (ESTRACE VAGINAL) 0.1 MG/GM vaginal cream Place 1 Applicatorful vaginally at bedtime.   famotidine (PEPCID) 40 MG tablet Take 1 tablet (40 mg total) by mouth at bedtime.   fluticasone (FLONASE) 50 MCG/ACT nasal spray Place 2 sprays into both nostrils daily.   furosemide (LASIX) 20 MG tablet 1 tab by mouth daily as needed for edema, weight gain >3lbs in 24 hours   glucose blood (ONETOUCH VERIO) test strip TEST BLOOD GLUCOSE ONCE A DAY   hyoscyamine (LEVSIN SL) 0.125 MG SL tablet DISSOLVE 1 TABLET(0.125 MG) UNDER THE TONGUE EVERY 4 HOURS AS NEEDED   ipratropium (ATROVENT) 0.03 % nasal spray Place 2  sprays into both nostrils 2 (two) times daily as needed for rhinitis. Use less frequently if you become too dry.   Lancets (ONETOUCH DELICA PLUS LANCET33G) MISC TEST BLOOD SUGAR ONCE A DAY   levocetirizine (XYZAL) 5 MG tablet TAKE 1 TABLET(5 MG) BY MOUTH EVERY EVENING   meclizine (ANTIVERT) 25 MG tablet Take 1 tablet (25 mg total) by mouth 2 (two) times daily as needed for up to 20 doses for dizziness.   montelukast (SINGULAIR) 10 MG tablet TAKE 1 TABLET(10 MG) BY MOUTH AT BEDTIME   nystatin cream (MYCOSTATIN)  APPLY TOPICALLY TO THE AFFECTED AREA TWICE DAILY   Olopatadine HCl 0.2 % SOLN Place 1 drop in each eye once a day as needed for itchy watery eyes   Omega-3 1000 MG CAPS Take 1,000 mg by mouth daily.   omeprazole (PRILOSEC) 40 MG capsule Take 1 capsule (40 mg total) by mouth daily as needed (reflux).   potassium chloride SA (KLOR-CON M) 20 MEQ tablet TAKE 2 TABLETS BY MOUTH DAILY FOR 3 DAYS. THEN TAKE 1 TABLET BY MOUTH DAILY.   rosuvastatin (CRESTOR) 5 MG tablet TAKE 1 TABLET(5 MG) BY MOUTH 2 TIMES A WEEK   SYNTHROID 137 MCG tablet Take 1 tablet (137 mcg total) by mouth as directed. 1 tablet 6 days out of the week   triamterene-hydrochlorothiazide (MAXZIDE-25) 37.5-25 MG tablet Take 1 tablet by mouth daily.   No facility-administered encounter medications on file as of 04/13/2023.    Allergies (verified) Levaquin [levofloxacin], Clindamycin/lincomycin, Codeine, and Gabapentin   History: Past Medical History:  Diagnosis Date   Allergy History of food and drug   Arthritis    Asthma    Baker's cyst of knee, right 02/16/2017   Cancer (HCC) 2002   thyroid (2002)   Cataract History surgery 2017   Cystic breast    Diabetes (HCC)    GERD (gastroesophageal reflux disease)    Hepatic cyst 02/02/2015   Hyperlipidemia    Hypertension    Insomnia 07/23/2016   Menopause    Migraine    Palpitations    Preventative health care 01/18/2016   Sleep apnea 02/02/2015   Thyroid disease    Past Surgical History:  Procedure Laterality Date   ABDOMINAL HYSTERECTOMY     took right ovary and uterus   APPENDECTOMY     CARPAL TUNNEL RELEASE Right    CESAREAN SECTION     CHOLECYSTECTOMY     EYE SURGERY Bilateral 2017   cataracts   KNEE ARTHROPLASTY     SHOULDER ARTHROSCOPY W/ ROTATOR CUFF REPAIR Left    THYROIDECTOMY  2002   TUBAL LIGATION     Family History  Problem Relation Age of Onset   Arthritis Mother    Transient ischemic attack Mother    Hypertension Mother    Hyperlipidemia Mother     Diabetes Father    Heart disease Father    Arthritis Father    Kidney disease Father    Hypertension Father    Hyperlipidemia Father    Cancer Sister 52       pancreatic   Fibromyalgia Sister    Allergic rhinitis Sister    Asthma Maternal Grandfather        smoker   Depression Maternal Grandfather    Dementia Paternal Grandmother    Diabetes Paternal Grandfather    Hypertension Brother    Allergic rhinitis Brother    Scoliosis Daughter        had rods placed and removed  Arthritis Sister    Gout Sister    GER disease Sister    Cancer Sister        breast cancer   Breast cancer Sister 26   Social History   Socioeconomic History   Marital status: Divorced    Spouse name: Not on file   Number of children: 2   Years of education: Not on file   Highest education level: Not on file  Occupational History   Not on file  Tobacco Use   Smoking status: Never   Smokeless tobacco: Never  Vaping Use   Vaping status: Never Used  Substance and Sexual Activity   Alcohol use: Yes    Comment: A few times a year   Drug use: No   Sexual activity: Yes    Birth control/protection: Post-menopausal, Surgical, Other-see comments    Comment:  boyfriend  Other Topics Concern   Not on file  Social History Narrative   Household: pt, daughter and sister    Social Determinants of Health   Financial Resource Strain: Low Risk  (04/08/2023)   Overall Financial Resource Strain (CARDIA)    Difficulty of Paying Living Expenses: Not very hard  Food Insecurity: No Food Insecurity (04/08/2023)   Hunger Vital Sign    Worried About Running Out of Food in the Last Year: Never true    Ran Out of Food in the Last Year: Never true  Transportation Needs: No Transportation Needs (04/08/2023)   PRAPARE - Administrator, Civil Service (Medical): No    Lack of Transportation (Non-Medical): No  Physical Activity: Insufficiently Active (04/08/2023)   Exercise Vital Sign    Days of Exercise  per Week: 2 days    Minutes of Exercise per Session: 20 min  Stress: No Stress Concern Present (04/08/2023)   [employer] of Occupational Health - Occupational Stress Questionnaire    Feeling of Stress : Only a little  Social Connections: Unknown (04/08/2023)   Social Connection and Isolation Panel [NHANES]    Frequency of Communication with Friends and Family: More than three times a week    Frequency of Social Gatherings with Friends and Family: Twice a week    Attends Religious Services: Not on Marketing executive or Organizations: No    Attends Banker Meetings: Never    Marital Status: Divorced    Tobacco Counseling Counseling given: Not Answered   Clinical Intake:  Pre-visit preparation completed: Yes  Pain : No/denies pain  Nutritional Risks: None Diabetes: Yes CBG done?: No Did pt. bring in CBG monitor from home?: No  How often do you need to have someone help you when you read instructions, pamphlets, or other written materials from your doctor or pharmacy?: 1 - Never  Interpreter Needed?: No  Information entered by :: Donne Anon, CMA   Activities of Daily Living    04/08/2023    2:50 PM  In your present state of health, do you have any difficulty performing the following activities:  Hearing? 0  Vision? 1  Difficulty concentrating or making decisions? 0  Walking or climbing stairs? 1  Comment knee pain  Dressing or bathing? 0  Doing errands, shopping? 0  Preparing Food and eating ? N  Using the Toilet? N  In the past six months, have you accidently leaked urine? Y  Do you have problems with loss of bowel control? N  Managing your Medications? N  Managing your Finances? N  Housekeeping or managing your Housekeeping? N    Patient Care Team: Bradd Canary, MD as PCP - General (Family Medicine) Charna Elizabeth, MD as Consulting Physician (Gastroenterology) [place], Hilton Cork, MD (Inactive) as Consulting Physician  (Pulmonary Disease) Mia Creek, MD as Consulting Physician (Ophthalmology) Newman Pies, MD as Consulting Physician (Otolaryngology) Henrene Pastor, RPH-CPP (Pharmacist) [facility], Konrad Dolores, MD as Attending Physician (Endocrinology)  Indicate any recent Medical Services you may have received from other than [facility] providers in the past year (date may be approximate).     Assessment:   This is a routine wellness examination for Jataya.  Hearing/Vision screen No results found.   Goals Addressed   None    Depression Screen    04/13/2023    1:13 PM 05/12/2022   11:25 AM 04/04/2022    3:24 PM 02/08/2022    9:18 AM 11/09/2021    9:51 AM 07/20/2021    4:25 PM 03/02/2021    1:37 PM  PHQ 2/9 Scores  PHQ - 2 Score 0 0 0 0 0 0 0  PHQ- 9 Score  0         Fall Risk    04/08/2023    2:50 PM 12/21/2022    8:26 AM 08/16/2022    3:13 PM 05/12/2022   11:24 AM 04/04/2022    3:21 PM  Fall Risk   Falls in the past year? 0 0 0  1  Number falls in past yr: 0 0 0 0 0  Injury with Fall? 0 0 0 0 0  Risk for fall due to : No Fall Risks No Fall Risks     Follow up Falls evaluation completed Falls evaluation completed Falls evaluation completed Falls evaluation completed Falls prevention discussed    MEDICARE RISK AT HOME: Medicare Risk at Home Any stairs in or around the home?: Yes If so, are there any without handrails?: Yes Home free of loose throw rugs in walkways, pet beds, electrical cords, etc?: No Adequate lighting in your home to reduce risk of falls?: Yes Life alert?: No Use of a cane, walker or w/c?: No Grab bars in the bathroom?: No Shower chair or bench in shower?: No Elevated toilet seat or a handicapped toilet?: No  TIMED UP AND GO:  Was the test performed?  No    Cognitive Function:    07/21/2016    4:22 PM  MMSE - Mini Mental State Exam  Orientation to time 5  Orientation to Place 5  Registration 3  Attention/ Calculation 5  Recall 3  Language- name 2 objects 2   Language- repeat 1  Language- follow 3 step command 3  Language- read & follow direction 1  Write a sentence 1  Copy design 1  Total score 30        04/13/2023    1:17 PM 04/04/2022    3:32 PM  6CIT Screen  What Year? 0 points 0 points  What month? 0 points 0 points  What time? 0 points 0 points  Count back from 20 0 points 0 points  Months in reverse 0 points 0 points  Repeat phrase 0 points 0 points  Total Score 0 points 0 points    Immunizations Immunization History  Administered Date(s) Administered   Influenza Split 07/02/2022   Influenza, High Dose Seasonal PF 05/03/2017, 06/05/2018, 06/06/2020   Influenza-Unspecified 03/19/2015, 05/09/2016, 04/17/2021   PFIZER(Purple Top)SARS-COV-2 Vaccination 11/02/2019, 11/26/2019, 06/20/2020   Pneumococcal Conjugate-13 01/18/2016   Pneumococcal Polysaccharide-23 01/14/2021  RSV,unspecified 08/19/2022   Tdap 07/01/2014    TDAP status: Up to date  Flu Vaccine status: Due, Education has been provided regarding the importance of this vaccine. Advised may receive this vaccine at local pharmacy or Health Dept. Aware to provide a copy of the vaccination record if obtained from local pharmacy or Health Dept. Verbalized acceptance and understanding.  Pneumococcal vaccine status: Up to date  Covid-19 vaccine status: Information provided on how to obtain vaccines.   Qualifies for Shingles Vaccine? Yes   Zostavax completed No   Shingrix Completed?: No.    Education has been provided regarding the importance of this vaccine. Patient has been advised to call insurance company to determine out of pocket expense if they have not yet received this vaccine. Advised may also receive vaccine at local pharmacy or Health Dept. Verbalized acceptance and understanding.  Screening Tests Health Maintenance  Topic Date Due   Zoster Vaccines- Shingrix (1 of 2) Never done   Diabetic kidney evaluation - Urine ACR  02/09/2023   Medicare Annual  Wellness (AWV)  04/05/2023   INFLUENZA VACCINE  10/16/2023 (Originally 02/16/2023)   HEMOGLOBIN A1C  06/08/2023   FOOT EXAM  12/21/2023   OPHTHALMOLOGY EXAM  01/18/2024   Diabetic kidney evaluation - eGFR measurement  03/14/2024   DTaP/Tdap/Td (2 - Td or Tdap) 07/01/2024   MAMMOGRAM  09/12/2024   Colonoscopy  06/07/2025   Pneumonia Vaccine 24+ Years old  Completed   DEXA SCAN  Completed   Hepatitis C Screening  Completed   HPV VACCINES  Aged Out   COVID-19 Vaccine  Discontinued    Health Maintenance  Health Maintenance Due  Topic Date Due   Zoster Vaccines- Shingrix (1 of 2) Never done   Diabetic kidney evaluation - Urine ACR  02/09/2023   Medicare Annual Wellness (AWV)  04/05/2023    Colorectal cancer screening: Type of screening: Colonoscopy. Completed 06/08/15. Repeat every 10 years  Mammogram status: Completed 09/12/22. Repeat every year  Bone Density status: Completed 09/12/22. Results reflect: Bone density results: NORMAL. Repeat every 2 years.  Lung Cancer Screening: (Low Dose CT Chest recommended if Age 28-80 years, 20 pack-year currently smoking OR have quit w/in 15years.) does not qualify.   Additional Screening:  Hepatitis C Screening: does qualify; Completed 01/18/16  Vision Screening: Recommended annual ophthalmology exams for early detection of glaucoma and other disorders of the eye. Is the patient up to date with their annual eye exam?  Yes  Who is the provider or what is the name of the office in which the patient attends annual eye exams? Dr. Tod Persia If pt is not established with a provider, would they like to be referred to a provider to establish care? No .   Dental Screening: Recommended annual dental exams for proper oral hygiene  Diabetic Foot Exam: Diabetic Foot Exam: Completed 12/21/22  Community Resource Referral / Chronic Care Management: CRR required this visit?  No   CCM required this visit?  No     Plan:     I have personally reviewed  and noted the following in the patient's chart:   Medical and social history Use of alcohol, tobacco or illicit drugs  Current medications and supplements including opioid prescriptions. Patient is not currently taking opioid prescriptions. Functional ability and status Nutritional status Physical activity Advanced directives List of other physicians Hospitalizations, surgeries, and ER visits in previous 12 months Vitals Screenings to include cognitive, depression, and falls Referrals and appointments  In addition, I have  reviewed and discussed with patient certain preventive protocols, quality metrics, and best practice recommendations. A written personalized care plan for preventive services as well as general preventive health recommendations were provided to patient.     Donne Anon, CMA   04/13/2023   After Visit Summary: (MyChart) Due to this being a telephonic visit, the after visit summary with patients personalized plan was offered to patient via MyChart   Nurse Notes: None

## 2023-04-13 NOTE — Patient Instructions (Signed)
[name] , Thank you for taking time to come for your Medicare Wellness Visit. I appreciate your ongoing commitment to your health goals. Please review the following plan we discussed and let me know if I can assist you in the future.     This is a list of the screening recommended for you and due dates:  Health Maintenance  Topic Date Due   Zoster (Shingles) Vaccine (1 of 2) Never done   Yearly kidney health urinalysis for diabetes  02/09/2023   Flu Shot  10/16/2023*   Hemoglobin A1C  06/08/2023   Complete foot exam   12/21/2023   Eye exam for diabetics  01/18/2024   Yearly kidney function blood test for diabetes  03/14/2024   Medicare Annual Wellness Visit  04/12/2024   DTaP/Tdap/Td vaccine (2 - Td or Tdap) 07/01/2024   Mammogram  09/12/2024   Colon Cancer Screening  06/07/2025   Pneumonia Vaccine  Completed   DEXA scan (bone density measurement)  Completed   Hepatitis C Screening  Completed   HPV Vaccine  Aged Out   COVID-19 Vaccine  Discontinued  *Topic was postponed. The date shown is not the original due date.    Next appointment: Follow up in one year for your annual wellness visit    Preventive Care 74 Years and Older, Female Preventive care refers to lifestyle choices and visits with your health care provider that can promote health and wellness. What does preventive care include? A yearly physical exam. This is also called an annual well check. Dental exams once or twice a year. Routine eye exams. Ask your health care provider how often you should have your eyes checked. Personal lifestyle choices, including: Daily care of your teeth and gums. Regular physical activity. Eating a healthy diet. Avoiding tobacco and drug use. Limiting alcohol use. Practicing safe sex. Taking low-dose aspirin every day. Taking vitamin and mineral supplements as recommended by your health care provider. What happens during an annual well check? The services and screenings done by  your health care provider during your annual well check will depend on your age, overall health, lifestyle risk factors, and family history of disease. Counseling  Your health care provider may ask you questions about your: Alcohol use. Tobacco use. Drug use. Emotional well-being. Home and relationship well-being. Sexual activity. Eating habits. History of falls. Memory and ability to understand (cognition). Work and work Astronomer. Reproductive health. Screening  You may have the following tests or measurements: Height, weight, and BMI. Blood pressure. Lipid and cholesterol levels. These may be checked every 5 years, or more frequently if you are over 15 years old. Skin check. Lung cancer screening. You may have this screening every year starting at age 24 if you have a 30-pack-year history of smoking and currently smoke or have quit within the past 15 years. Fecal occult blood test (FOBT) of the stool. You may have this test every year starting at age 78. Flexible sigmoidoscopy or colonoscopy. You may have a sigmoidoscopy every 5 years or a colonoscopy every 10 years starting at age 75. Hepatitis C blood test. Hepatitis B blood test. Sexually transmitted disease (STD) testing. Diabetes screening. This is done by checking your blood sugar (glucose) after you have not eaten for a while (fasting). You may have this done every 1-3 years. Bone density scan. This is done to screen for osteoporosis. You may have this done starting at age 25. Mammogram. This may be done every 1-2 years. Talk to your health  care provider about how often you should have regular mammograms. Talk with your health care provider about your test results, treatment options, and if necessary, the need for more tests. Vaccines  Your health care provider may recommend certain vaccines, such as: Influenza vaccine. This is recommended every year. Tetanus, diphtheria, and acellular pertussis (Tdap, Td) vaccine. You  may need a Td booster every 10 years. Zoster vaccine. You may need this after age 67. Pneumococcal 13-valent conjugate (PCV13) vaccine. One dose is recommended after age 78. Pneumococcal polysaccharide (PPSV23) vaccine. One dose is recommended after age 46. Talk to your health care provider about which screenings and vaccines you need and how often you need them. This information is not intended to replace advice given to you by your health care provider. Make sure you discuss any questions you have with your health care provider. Document Released: 07/31/2015 Document Revised: 03/23/2016 Document Reviewed: 05/05/2015 Elsevier Interactive Patient Education  2017 [employer].  Fall Prevention in the Home Falls can cause injuries. They can happen to people of all ages. There are many things you can do to make your home safe and to help prevent falls. What can I do on the outside of my home? Regularly fix the edges of walkways and driveways and fix any cracks. Remove anything that might make you trip as you walk through a door, such as a raised step or threshold. Trim any bushes or trees on the path to your home. Use bright outdoor lighting. Clear any walking paths of anything that might make someone trip, such as rocks or tools. Regularly check to see if handrails are loose or broken. Make sure that both sides of any steps have handrails. Any raised decks and porches should have guardrails on the edges. Have any leaves, snow, or ice cleared regularly. Use sand or salt on walking paths during winter. Clean up any spills in your garage right away. This includes oil or grease spills. What can I do in the bathroom? Use night lights. Install grab bars by the toilet and in the tub and shower. Do not use towel bars as grab bars. Use non-skid mats or decals in the tub or shower. If you need to sit down in the shower, use a plastic, non-slip stool. Keep the floor dry. Clean up any water that spills  on the floor as soon as it happens. Remove soap buildup in the tub or shower regularly. Attach bath mats securely with double-sided non-slip rug tape. Do not have throw rugs and other things on the floor that can make you trip. What can I do in the bedroom? Use night lights. Make sure that you have a light by your bed that is easy to reach. Do not use any sheets or blankets that are too big for your bed. They should not hang down onto the floor. Have a firm chair that has side arms. You can use this for support while you get dressed. Do not have throw rugs and other things on the floor that can make you trip. What can I do in the kitchen? Clean up any spills right away. Avoid walking on wet floors. Keep items that you use a lot in easy-to-reach places. If you need to reach something above you, use a strong step stool that has a grab bar. Keep electrical cords out of the way. Do not use floor polish or wax that makes floors slippery. If you must use wax, use non-skid floor wax. Do not have throw  rugs and other things on the floor that can make you trip. What can I do with my stairs? Do not leave any items on the stairs. Make sure that there are handrails on both sides of the stairs and use them. Fix handrails that are broken or loose. Make sure that handrails are as long as the stairways. Check any carpeting to make sure that it is firmly attached to the stairs. Fix any carpet that is loose or worn. Avoid having throw rugs at the top or bottom of the stairs. If you do have throw rugs, attach them to the floor with carpet tape. Make sure that you have a light switch at the top of the stairs and the bottom of the stairs. If you do not have them, ask someone to add them for you. What else can I do to help prevent falls? Wear shoes that: Do not have high heels. Have rubber bottoms. Are comfortable and fit you well. Are closed at the toe. Do not wear sandals. If you use a stepladder: Make  sure that it is fully opened. Do not climb a closed stepladder. Make sure that both sides of the stepladder are locked into place. Ask someone to hold it for you, if possible. Clearly mark and make sure that you can see: Any grab bars or handrails. First and last steps. Where the edge of each step is. Use tools that help you move around (mobility aids) if they are needed. These include: Canes. Walkers. Scooters. Crutches. Turn on the lights when you go into a dark area. Replace any light bulbs as soon as they burn out. Set up your furniture so you have a clear path. Avoid moving your furniture around. If any of your floors are uneven, fix them. If there are any pets around you, be aware of where they are. Review your medicines with your doctor. Some medicines can make you feel dizzy. This can increase your chance of falling. Ask your doctor what other things that you can do to help prevent falls. This information is not intended to replace advice given to you by your health care provider. Make sure you discuss any questions you have with your health care provider. Document Released: 04/30/2009 Document Revised: 12/10/2015 Document Reviewed: 08/08/2014 Elsevier Interactive Patient Education  2017 [employer].

## 2023-04-21 DIAGNOSIS — Z23 Encounter for immunization: Secondary | ICD-10-CM | POA: Diagnosis not present

## 2023-04-28 DIAGNOSIS — J029 Acute pharyngitis, unspecified: Secondary | ICD-10-CM | POA: Diagnosis not present

## 2023-04-28 DIAGNOSIS — U071 COVID-19: Secondary | ICD-10-CM | POA: Diagnosis not present

## 2023-04-28 DIAGNOSIS — R509 Fever, unspecified: Secondary | ICD-10-CM | POA: Diagnosis not present

## 2023-05-01 ENCOUNTER — Encounter: Payer: Self-pay | Admitting: Family Medicine

## 2023-05-08 ENCOUNTER — Ambulatory Visit: Payer: Medicare Other | Admitting: Internal Medicine

## 2023-05-09 NOTE — Progress Notes (Unsigned)
FOLLOW UP Date of Service/Encounter:  05/10/23  Subjective:  Monica Wallace (DOB: 07-Feb-1951) is a 72 y.o. female who [facility] in re-evaluation of the following: moderate persistent asthma, allergic rhinitis, allergic conjunctivitis, and recurrent sinus infections.  History obtained from: chart review and patient.  For Review, LV was on 11/03/22  with Dr.Kili Gracy seen for routine follow-up. See below for summary of history and diagnostics.   Therapeutic plans/changes recommended: FEV1 83%, having asthma flare due to sinusitis.  Treated with 40 mg IM Depo and prednisone taper.  Started on Augmentin 875 for 7 to 10 days. ----------------------------------------------------- Pertinent History/Diagnostics:  Asthma: Restrictive lung pattern. Follows with pulmonary.  Controlled on pulmicort flexhaler 180 mcg 2 puffs daily, Singulair 10 mg. Allergic Rhinitis:  Previous sinus surgery with Dr.Teoh. Has reflux following with GI. Has hx of ETD. - SPT environmental panel (2021): intradermal's mildly reactive to grass pollen, tree pollen, mold mix #4, and dust mite  Current Meds: Atrovent nasal spray as needed, Flonase daily, azelastine nasal spray as needed, Pataday eyedrops, Xyzal. Follows with Dr. Suszanne Conners ENT for recurrent sinusitis. Other: Has reflux on omeprazole 40 mg daily, follows with GI. --------------------------------------------------- Today presents for follow-up. Discussed the use of AI scribe software for clinical note transcription with the patient, who gave verbal consent to proceed.  History of Present Illness   The patient, with a history of asthma and gastroesophageal reflux disease (GERD), presents with persistent coughing and a sensation of imbalance following a COVID-19 infection on October 11th. She reports a postnasal drip sensation but denies significant nasal discharge. During the acute phase of the COVID-19 infection, the patient  was treated with Paxlovid and experienced more coughing than nasal symptoms.  The patient has been using azelastine and Flonase nasal sprays, albeit inconsistently, and has switched from Xyzal to Claritin for allergy symptoms due to persistent eye itching.  She feels that Claritin worked better for her symptoms.  She has run out of Pataday eye drops and is considering trying Zatidor as a more affordable alternative.  Her GERD has been well-controlled with as-needed omeprazole, and she has been mindful of late-night eating and sleeping positions to manage symptoms.  Regarding her asthma, the patient reports good control, even during the COVID-19 infection, with only occasional use of her rescue inhaler and Pulmicort. She expresses a preference for once-daily Pulmicort use due to cost concerns.  The patient has an upcoming appointment with a pulmonologist but may reschedule due to logistical issues.   She is currently experiencing clear nasal discharge and has fluid in her ears. Given the persistence of symptoms two weeks post-COVID-19 infection, the patient is being considered for antibiotic and steroid treatment to manage potential bacterial infection and inflammation.  She has not required either steroids or antibiotics since her last visit.    All medications reviewed by clinical staff and updated in chart. No new pertinent medical or surgical history except as noted in HPI.  ROS: All others negative except as noted per HPI.   Objective:  BP 104/62   Pulse 87   Temp (!) 97.5 F (36.4 C) (Temporal)   Resp 16   SpO2 98%  There is no height or weight on file to calculate BMI. Physical Exam: General Appearance:  Alert, cooperative, no distress, appears stated age  Head:  Normocephalic, without obvious abnormality, atraumatic  Eyes:  Conjunctiva clear, EOM's intact  Ears Clear fluid behind bilateral TMs and EACs normal bilaterally  Nose: Nares normal, hypertrophic turbinates, normal  mucosa, and no visible anterior polyps  Throat: Lips, tongue normal; teeth and gums normal, normal posterior oropharynx, no tonsillar exudate, and + cobblestoning  Neck: Supple, symmetrical  Lungs:   clear to auscultation bilaterally, Respirations unlabored, intermittent dry coughing  Heart:  regular rate and rhythm and no murmur, Appears well perfused  Extremities: No edema  Skin: Skin color, texture, turgor normal and no rashes or lesions on visualized portions of skin  Neurologic: No gross deficits   Labs:  Lab Orders  No laboratory test(s) ordered today   ACT 23/25.  Assessment/Plan   Mild persistent asthma-at goal Continue Pulmicort Flexhaler  180 mcg 2 puffs once a day to help prevent cough and wheeze. Increase to 2 puffs twice daily during asthma flares.  Continue montelukast (Singulair) 10 mg once a day to help prevent cough and wheeze May use albuterol 2 puffs every 4-6 hours as needed for cough, wheeze, tightness in chest, shortness of breath.  Also may use albuterol 2 puffs 5 to 15 minutes prior to exercise Continue to follow up with pulmonology   Allergic rhinoconjunctivitis with acute sinusitis post - COVID 19 infections (2021 skin testing shows intradermal's mildly reactive to grass pollen, tree pollen, mold mix #4, and dust mite)   - 40 mg IM depo - tomorrow start prednisone 20 mg daily for the next 4 days - start Augmentin 875 mg twice daily for 10 days. Take with probiotics or live culture yogurt.  For nasal symptoms:  First use sinus rinse-use twice daily prior to medicated nasal sprays. Continue saline gel for nasal dryness Next use medicated nasal sprays: Continue azelastine nasal spray 1-2 spray each nostril twice a day as needed for runny nose/stuffy nose/drainage down throat. Caution as this can be drying Continue fluticasone 1-2 sprays in each nostril once a day as needed for stuffy nose May use Pataday or Zaditor 1 drop each eye once a day as needed for  itchy watery eyes.  Claritin (loratadine) 10 mg once a day as needed for runny nose   Reflux Continue omeprazole 40 mg once a day as needed Continue follow-up with GI  Please let us know if this treatment plan is not working well for you Schedule a follow-up appointment in 6 months or sooner if needed  Other: none  Tonny Bollman, MD  [facility]

## 2023-05-10 ENCOUNTER — Ambulatory Visit: Payer: Medicare Other | Attending: Otolaryngology | Admitting: Physical Therapy

## 2023-05-10 ENCOUNTER — Encounter: Payer: Self-pay | Admitting: Internal Medicine

## 2023-05-10 ENCOUNTER — Ambulatory Visit: Payer: Medicare Other | Admitting: Internal Medicine

## 2023-05-10 ENCOUNTER — Encounter: Payer: Self-pay | Admitting: Physical Therapy

## 2023-05-10 VITALS — BP 104/62 | HR 87 | Temp 97.5°F | Resp 16

## 2023-05-10 DIAGNOSIS — M79642 Pain in left hand: Secondary | ICD-10-CM | POA: Diagnosis not present

## 2023-05-10 DIAGNOSIS — U071 COVID-19: Secondary | ICD-10-CM

## 2023-05-10 DIAGNOSIS — M064 Inflammatory polyarthropathy: Secondary | ICD-10-CM | POA: Diagnosis not present

## 2023-05-10 DIAGNOSIS — J069 Acute upper respiratory infection, unspecified: Secondary | ICD-10-CM

## 2023-05-10 DIAGNOSIS — K219 Gastro-esophageal reflux disease without esophagitis: Secondary | ICD-10-CM

## 2023-05-10 DIAGNOSIS — M154 Erosive (osteo)arthritis: Secondary | ICD-10-CM | POA: Diagnosis not present

## 2023-05-10 DIAGNOSIS — J018 Other acute sinusitis: Secondary | ICD-10-CM

## 2023-05-10 DIAGNOSIS — J4530 Mild persistent asthma, uncomplicated: Secondary | ICD-10-CM

## 2023-05-10 DIAGNOSIS — J3089 Other allergic rhinitis: Secondary | ICD-10-CM | POA: Diagnosis not present

## 2023-05-10 DIAGNOSIS — R42 Dizziness and giddiness: Secondary | ICD-10-CM | POA: Diagnosis not present

## 2023-05-10 DIAGNOSIS — M79641 Pain in right hand: Secondary | ICD-10-CM | POA: Diagnosis not present

## 2023-05-10 DIAGNOSIS — R2681 Unsteadiness on feet: Secondary | ICD-10-CM | POA: Diagnosis not present

## 2023-05-10 DIAGNOSIS — E669 Obesity, unspecified: Secondary | ICD-10-CM | POA: Diagnosis not present

## 2023-05-10 DIAGNOSIS — Z6836 Body mass index (BMI) 36.0-36.9, adult: Secondary | ICD-10-CM | POA: Diagnosis not present

## 2023-05-10 MED ORDER — FLUTICASONE PROPIONATE 50 MCG/ACT NA SUSP: 2.0000 | Freq: Every day | NASAL | 5 refills | Status: DC

## 2023-05-10 MED ORDER — MONTELUKAST SODIUM 10 MG PO TABS: ORAL_TABLET | ORAL | 1 refills | Status: DC

## 2023-05-10 MED ORDER — AZELASTINE HCL 0.1 % NA SOLN: NASAL | 5 refills | Status: DC

## 2023-05-10 MED ORDER — ALBUTEROL SULFATE HFA 108 (90 BASE) MCG/ACT IN AERS: 2.0000 | INHALATION_SPRAY | Freq: Four times a day (QID) | RESPIRATORY_TRACT | 1 refills | Status: DC | PRN

## 2023-05-10 MED ORDER — METHYLPREDNISOLONE ACETATE 40 MG/ML IJ SUSP
40.0000 mg | Freq: Once | INTRAMUSCULAR | Status: AC
  Administered 2023-05-10: 40 mg via INTRAMUSCULAR

## 2023-05-10 MED ORDER — PULMICORT FLEXHALER 180 MCG/ACT IN AEPB: INHALATION_SPRAY | RESPIRATORY_TRACT | 5 refills | Status: DC

## 2023-05-10 NOTE — Therapy (Signed)
OUTPATIENT PHYSICAL THERAPY VESTIBULAR EVALUATION     Patient Name: Monica Wallace MRN: [id number] DOB:[birth year], 72 y.o., female Today's Date: 05/10/2023  END OF SESSION:  PT End of Session - 05/10/23 1310     Visit Number 2    Date for PT Re-Evaluation 07/13/23    Authorization Type [employer]    PT Start Time 1310    PT Stop Time 1355    PT Time Calculation (min) 45 min    Activity Tolerance Patient tolerated treatment well    Behavior During Therapy WFL for tasks assessed/performed             Past Medical History:  Diagnosis Date   Allergy History of food and drug   Arthritis    Asthma    Baker's cyst of knee, right 02/16/2017   Cancer (HCC) 2002   thyroid (2002)   Cataract History surgery 2017   Cystic breast    Diabetes (HCC)    GERD (gastroesophageal reflux disease)    Hepatic cyst 02/02/2015   Hyperlipidemia    Hypertension    Insomnia 07/23/2016   Menopause    Migraine    Palpitations    Preventative health care 01/18/2016   Sleep apnea 02/02/2015   Thyroid disease    Past Surgical History:  Procedure Laterality Date   ABDOMINAL HYSTERECTOMY     took right ovary and uterus   APPENDECTOMY     CARPAL TUNNEL RELEASE Right    CESAREAN SECTION     CHOLECYSTECTOMY     EYE SURGERY Bilateral 2017   cataracts   KNEE ARTHROPLASTY     SHOULDER ARTHROSCOPY W/ ROTATOR CUFF REPAIR Left    THYROIDECTOMY  2002   TUBAL LIGATION     Patient Active Problem List   Diagnosis Date Noted   Mild persistent asthma without complication 04/22/2022   Dysfunction of both eustachian tubes 04/22/2022   UTI symptoms 04/07/2022   Hx of papillary thyroid carcinoma 02/28/2022   Left ankle pain 02/09/2022   Dizziness 02/08/2022   Edema 02/08/2022   Unsteady gait 11/14/2021   Lumbar radiculopathy 09/09/2021   Cervical cancer screening 03/07/2021   Sinusitis 10/30/2020   Allergic conjunctivitis 04/08/2020   Neck pain 02/06/2020   Right knee pain 09/30/2019   COVID-19  09/30/2019   Educated about COVID-19 virus infection 12/05/2018   Baker's cyst of knee, right 02/16/2017   Insomnia 07/23/2016   Hypokalemia 07/23/2016   Controlled type 2 diabetes mellitus without complication, without long-term current use of insulin (HCC) 01/24/2016   Preventative health care 01/18/2016   Left-sided low back pain with left-sided sciatica 10/07/2015   Obesity (BMI 30-39.9) 06/05/2015   OSA (obstructive sleep apnea) 02/02/2015   Palpitations 09/19/2014   Plantar fasciitis, bilateral 11/19/2013   Pronation deformity of ankle, acquired 11/19/2013   DJD (degenerative joint disease) 08/26/2013   Fibrocystic breast 08/26/2013   HTN (hypertension) 08/26/2013   GERD (gastroesophageal reflux disease) 08/26/2013   S/P hysterectomy with oophorectomy 08/26/2013   Hypothyroidism 08/26/2013   Chronic hoarseness 08/26/2013   Colon polyp 08/26/2013   Hiatal hernia 08/26/2013   Irritable bowel syndrome 08/26/2013   Allergic rhinoconjunctivitis 08/26/2013   Hyperlipidemia 08/22/2013   Migraine 08/22/2013   Thyroid cancer (HCC) 08/22/2013    PCP: Abner Greenspan, MD REFERRING PROVIDER: Suszanne Conners, MD  REFERRING DIAG: Dizziness  THERAPY DIAG:  Dizziness and giddiness  Unsteadiness on feet  ONSET DATE: 02/24/23  Rationale for Evaluation and Treatment: Rehabilitation  SUBJECTIVE:   SUBJECTIVE STATEMENT: Reports that  she tested positive for Covid about 10 days after out last treatment, she reports she has not had the vertigo recently but reports that she has a mild dizziness.  She has not been able to perform the HEP much due to this and will be seeing her allergy MD today Pt accompanied by: self  PERTINENT HISTORY: see above  PAIN:  Are you having pain? Yes: NPRS scale: 2/10 Pain location: right knee Pain description: ache/sore Aggravating factors: walking Relieving factors: got injection today  PRECAUTIONS: None  RED FLAGS: None   WEIGHT BEARING RESTRICTIONS: No  FALLS:  Has patient fallen in last 6 months? 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, does some lifting  PATIENT GOALS: have less dizziness  OBJECTIVE:   DIAGNOSTIC FINDINGS: none  COGNITION: Overall cognitive status: Impaired   SENSATION: WFL  POSTURE:  rounded shoulders and forward head  Cervical ROM:  tight and sore  Active A/PROM (deg) eval  Flexion WNL's  Extension Decreased 100%  Right lateral flexion   Left lateral flexion   Right rotation Decreased 75%  Left rotation Decreased 75%  (Blank rows = not tested)  STRENGTH: Good   GAIT: Gait pattern:  mild limp on the right Distance walked: 50' Assistive device utilized: None Level of assistance: Complete Independence  VESTIBULAR ASSESSMENT:   SYMPTOM BEHAVIOR:  Subjective history: see above  Non-Vestibular symptoms: neck pain  Type of dizziness: Spinning/Vertigo and "World moves"  Frequency: 2x  Duration: 1-2 days  Aggravating factors: Induced by motion: turning body quickly and turning head quickly  Relieving factors: dark room, closing eyes, and rest  Progression of symptoms: better  OCULOMOTOR EXAM:  Ocular Alignment: normal  Ocular ROM:  good  Spontaneous Nystagmus: absent  Gaze-Induced Nystagmus: absent  Smooth Pursuits: intact  Saccades: intact VESTIBULAR - OCULAR REFLEX:   Slow VOR: Normal  VOR Cancellation: Unable to Maintain Gaze    POSITIONAL TESTING: Right Dix-Hallpike: no nystagmus Left Dix-Hallpike: no nystagmus    OTHOSTATICS: not done   VESTIBULAR TREATMENT:                                                                                                   DATE:  05/10/23 Performed Dix-Halpike right and left and Horizontal canal test right and left, all were negative. Reviewed current status of the  dizziness and the possibilities, went over HEP and performed    PATIENT EDUCATION: Education details: HEP/POC Person educated: Patient Education method: Explanation, Demonstration, Verbal cues, and Handouts Education comprehension: verbalized understanding  HOME EXERCISE PROGRAM:  GOALS: Goals reviewed with patient? Yes  SHORT TERM GOALS: Target date: 05/04/23  Independent with initial HEP Goal status:met 05/10/23  LONG TERM GOALS: Target date: 07/13/23  Independent with advanced HEP Goal status: progressing 05/10/23  2.  Report no issues with vertigo over a 4 week period Goal status: INITIAL  3.  Understand self treatment for BPPV Goal status:  progressing 05/10/23  ASSESSMENT:  CLINICAL IMPRESSION: Patient is a 72 y.o. female who was seen today for physical therapy evaluation and treatment for dizziness.  During testing today she had negative posterior and horizontal canal testing bilaterally, she has had covid recently and is reporting a mild dizziness has not had the vertigo in the past 4 weeks.  Reviewed possible issues and what is going on, she will be seeing allergy MD this PM. Reviewed current HEP and performed  OBJECTIVE IMPAIRMENTS: decreased activity tolerance, decreased balance, decreased coordination, decreased endurance, decreased mobility, difficulty walking, decreased strength, dizziness, increased muscle spasms, improper body mechanics, postural dysfunction, and pain.   REHAB POTENTIAL: Good  CLINICAL DECISION MAKING: Stable/uncomplicated  EVALUATION COMPLEXITY: Low   PLAN:  PT FREQUENCY: 1-2x/week  PT DURATION: 12 weeks  PLANNED INTERVENTIONS: Therapeutic exercises, Therapeutic activity, Neuromuscular re-education, Balance training, Gait training, Patient/Family education, Self Care, Joint mobilization, Vestibular training, Canalith repositioning, Visual/preceptual remediation/compensation, Dry Needling, Electrical stimulation, Moist heat, Traction,  and Manual therapy  PLAN FOR NEXT SESSION: Hold treatment, I asked her if she has vertigo back in the next 8 weeks to call us to try to get her in in a quick time to address, she will see her MD today   Jearld Lesch, PT 05/10/2023, 1:11 PM

## 2023-05-10 NOTE — Patient Instructions (Addendum)
Mild persistent asthma-at goal Continue Pulmicort Flexhaler  180 mcg 2 puffs once a day to help prevent cough and wheeze. Increase to 2 puffs twice daily during asthma flares.  Continue montelukast (Singulair) 10 mg once a day to help prevent cough and wheeze May use albuterol 2 puffs every 4-6 hours as needed for cough, wheeze, tightness in chest, shortness of breath.  Also may use albuterol 2 puffs 5 to 15 minutes prior to exercise Continue to follow up with pulmonology   Allergic rhinoconjunctivitis with acute sinusitis post - COVID 19 infections (2021 skin testing shows intradermal's mildly reactive to grass pollen, tree pollen, mold mix #4, and dust mite) with eustachian tube dysfunction  - 40 mg IM depo - tomorrow start prednisone 20 mg daily for the next 4 days - start Augmentin 875 mg twice daily for 10 days. Take with probiotics or live culture yogurt.  For nasal symptoms:  First use sinus rinse-use twice daily prior to medicated nasal sprays. Continue saline gel for nasal dryness Next use medicated nasal sprays: Continue azelastine nasal spray 1-2 spray each nostril twice a day as needed for runny nose/stuffy nose/drainage down throat. Caution as this can be drying Continue fluticasone 1-2 sprays in each nostril once a day as needed for stuffy nose May use Pataday or Zaditor 1 drop each eye once a day as needed for itchy watery eyes.  Claritin (loratadine) 10 mg once a day as needed for runny nose   Reflux Continue omeprazole 40 mg once a day as needed Continue follow-up with GI  Please let us know if this treatment plan is not working well for you Schedule a follow-up appointment in 6 months or sooner if needed

## 2023-05-11 ENCOUNTER — Other Ambulatory Visit: Payer: Self-pay

## 2023-05-11 MED ORDER — PREDNISONE 10 MG PO TABS: 20.0000 mg | ORAL_TABLET | Freq: Every day | ORAL | 0 refills | Status: DC

## 2023-05-11 MED ORDER — AMOXICILLIN-POT CLAVULANATE 875-125 MG PO TABS: 1.0000 | ORAL_TABLET | Freq: Two times a day (BID) | ORAL | 0 refills | Status: DC

## 2023-05-13 ENCOUNTER — Encounter: Payer: Self-pay | Admitting: Family Medicine

## 2023-05-24 ENCOUNTER — Ambulatory Visit: Payer: Medicare Other | Admitting: Pulmonary Disease

## 2023-05-25 ENCOUNTER — Other Ambulatory Visit: Payer: Self-pay | Admitting: Family Medicine

## 2023-06-06 ENCOUNTER — Ambulatory Visit (INDEPENDENT_AMBULATORY_CARE_PROVIDER_SITE_OTHER): Payer: Medicare Other

## 2023-06-13 DIAGNOSIS — J209 Acute bronchitis, unspecified: Secondary | ICD-10-CM | POA: Diagnosis not present

## 2023-06-13 DIAGNOSIS — R059 Cough, unspecified: Secondary | ICD-10-CM | POA: Diagnosis not present

## 2023-06-21 ENCOUNTER — Emergency Department (HOSPITAL_BASED_OUTPATIENT_CLINIC_OR_DEPARTMENT_OTHER): Payer: Medicare Other

## 2023-06-21 ENCOUNTER — Other Ambulatory Visit: Payer: Self-pay

## 2023-06-21 ENCOUNTER — Encounter (HOSPITAL_BASED_OUTPATIENT_CLINIC_OR_DEPARTMENT_OTHER): Payer: Self-pay | Admitting: Emergency Medicine

## 2023-06-21 ENCOUNTER — Emergency Department (HOSPITAL_BASED_OUTPATIENT_CLINIC_OR_DEPARTMENT_OTHER)
Admission: EM | Admit: 2023-06-21 | Discharge: 2023-06-21 | Disposition: A | Discharge status: Home / Self Care | Payer: Medicare Other | Attending: Emergency Medicine | Admitting: Emergency Medicine

## 2023-06-21 DIAGNOSIS — R1110 Vomiting, unspecified: Secondary | ICD-10-CM | POA: Diagnosis present

## 2023-06-21 DIAGNOSIS — I10 Essential (primary) hypertension: Secondary | ICD-10-CM | POA: Diagnosis not present

## 2023-06-21 DIAGNOSIS — E039 Hypothyroidism, unspecified: Secondary | ICD-10-CM | POA: Insufficient documentation

## 2023-06-21 DIAGNOSIS — K769 Liver disease, unspecified: Secondary | ICD-10-CM | POA: Diagnosis not present

## 2023-06-21 DIAGNOSIS — Z79890 Hormone replacement therapy (postmenopausal): Secondary | ICD-10-CM | POA: Insufficient documentation

## 2023-06-21 DIAGNOSIS — R1010 Upper abdominal pain, unspecified: Secondary | ICD-10-CM | POA: Diagnosis not present

## 2023-06-21 DIAGNOSIS — K5730 Diverticulosis of large intestine without perforation or abscess without bleeding: Secondary | ICD-10-CM | POA: Diagnosis not present

## 2023-06-21 DIAGNOSIS — Z79899 Other long term (current) drug therapy: Secondary | ICD-10-CM | POA: Diagnosis not present

## 2023-06-21 DIAGNOSIS — E119 Type 2 diabetes mellitus without complications: Secondary | ICD-10-CM | POA: Diagnosis not present

## 2023-06-21 DIAGNOSIS — K529 Noninfective gastroenteritis and colitis, unspecified: Secondary | ICD-10-CM | POA: Insufficient documentation

## 2023-06-21 DIAGNOSIS — D72829 Elevated white blood cell count, unspecified: Secondary | ICD-10-CM | POA: Diagnosis not present

## 2023-06-21 DIAGNOSIS — K449 Diaphragmatic hernia without obstruction or gangrene: Secondary | ICD-10-CM | POA: Diagnosis not present

## 2023-06-21 DIAGNOSIS — R000 Tachycardia, unspecified: Secondary | ICD-10-CM | POA: Diagnosis not present

## 2023-06-21 LAB — COMPREHENSIVE METABOLIC PANEL
ALT: 28 U/L (ref 0–44)
AST: 23 U/L (ref 15–41)
Albumin: 3.5 g/dL (ref 3.5–5.0)
Alkaline Phosphatase: 84 U/L (ref 38–126)
Anion gap: 12 (ref 5–15)
BUN: 14 mg/dL (ref 8–23)
CO2: 25 mmol/L (ref 22–32)
Calcium: 8.7 mg/dL — ABNORMAL LOW (ref 8.9–10.3)
Chloride: 99 mmol/L (ref 98–111)
Creatinine, Ser: 0.76 mg/dL (ref 0.44–1.00)
GFR, Estimated: >60 mL/min (ref >60)
Glucose, Bld: 109 mg/dL — ABNORMAL HIGH (ref 70–99)
Potassium: 3.5 mmol/L (ref 3.5–5.1)
Sodium: 136 mmol/L (ref 135–145)
Total Bilirubin: 0.6 mg/dL (ref <1.2)
Total Protein: 6.8 g/dL (ref 6.5–8.1)

## 2023-06-21 LAB — CBC WITH DIFFERENTIAL/PLATELET
Abs Immature Granulocytes: 0.06 10*3/uL (ref 0.00–0.07)
Basophils Absolute: 0 10*3/uL (ref 0.0–0.1)
Basophils Relative: 0 %
Eosinophils Absolute: 0 10*3/uL (ref 0.0–0.5)
Eosinophils Relative: 0 %
HCT: 41.2 % (ref 36.0–46.0)
Hemoglobin: 13.7 g/dL (ref 12.0–15.0)
Immature Granulocytes: 0 %
Lymphocytes Relative: 9 %
Lymphs Abs: 1.3 10*3/uL (ref 0.7–4.0)
MCH: 30.2 pg (ref 26.0–34.0)
MCHC: 33.3 g/dL (ref 30.0–36.0)
MCV: 90.7 fL (ref 80.0–100.0)
Monocytes Absolute: 0.7 10*3/uL (ref 0.1–1.0)
Monocytes Relative: 5 %
Neutro Abs: 12.5 10*3/uL — ABNORMAL HIGH (ref 1.7–7.7)
Neutrophils Relative %: 86 %
Platelets: 311 10*3/uL (ref 150–400)
RBC: 4.54 MIL/uL (ref 3.87–5.11)
RDW: 13.3 % (ref 11.5–15.5)
WBC: 14.5 10*3/uL — ABNORMAL HIGH (ref 4.0–10.5)
nRBC: 0 % (ref 0.0–0.2)

## 2023-06-21 LAB — URINALYSIS, ROUTINE W REFLEX MICROSCOPIC
Bilirubin Urine: NEGATIVE
Glucose, UA: NEGATIVE mg/dL
Hgb urine dipstick: NEGATIVE
Ketones, ur: NEGATIVE mg/dL
Leukocytes,Ua: NEGATIVE
Nitrite: NEGATIVE
Protein, ur: NEGATIVE mg/dL
Specific Gravity, Urine: 1.02 (ref 1.005–1.030)
pH: 7 (ref 5.0–8.0)

## 2023-06-21 LAB — LIPASE, BLOOD: Lipase: 26 U/L (ref 11–51)

## 2023-06-21 LAB — TROPONIN I (HIGH SENSITIVITY): Troponin I (High Sensitivity): 2 ng/L (ref <18)

## 2023-06-21 LAB — LACTIC ACID, PLASMA: Lactic Acid, Venous: 1.5 mmol/L (ref 0.5–1.9)

## 2023-06-21 MED ORDER — IOHEXOL 300 MG/ML  SOLN: 100.0000 mL | Freq: Once | INTRAMUSCULAR | Status: DC | PRN

## 2023-06-21 MED ORDER — SODIUM CHLORIDE 0.9 % IV BOLUS
1000.0000 mL | Freq: Once | INTRAVENOUS | Status: AC
  Administered 2023-06-21: 1000 mL via INTRAVENOUS

## 2023-06-21 MED ORDER — ALUM & MAG HYDROXIDE-SIMETH 200-200-20 MG/5ML PO SUSP
15.0000 mL | Freq: Once | ORAL | Status: AC
  Administered 2023-06-21: 15 mL via ORAL
  Filled 2023-06-21: qty 30

## 2023-06-21 MED ORDER — METOCLOPRAMIDE HCL 10 MG PO TABS: 10.0000 mg | ORAL_TABLET | Freq: Four times a day (QID) | ORAL | 0 refills | Status: DC

## 2023-06-21 MED ORDER — ONDANSETRON HCL 4 MG/2ML IJ SOLN
4.0000 mg | Freq: Once | INTRAMUSCULAR | Status: AC
  Administered 2023-06-21: 4 mg via INTRAVENOUS
  Filled 2023-06-21: qty 2

## 2023-06-21 NOTE — ED Provider Notes (Signed)
 [place] [facility] Provider Note   CSN: [id number] Arrival date & time: 06/21/23  1236     History Chief Complaint  Patient presents with   Emesis    Monica Wallace is a 72 y.o. female.  Patient presents to the emergency department past medical history significant for hyperlipidemia, hypertension, GERD, hypothyroidism, irritable bowel syndrome, obesity, and type 2 diabetes with concerns of vomiting.  Patient reports that she woke up around 3:30 AM this morning with an episode of vomiting.  Has had 3 other episodes thereafter but denies any diarrhea.  Denies any chills, fever, body aches, chest pain, shortness of breath, or any other acute concerns.  Denies any recent sick contact with any individuals with similar symptoms.  Reports severe GERD and initially felt that she was having reflux complications around 1 AM this morning and tried to go back to sleep but eventually had to vomit at 3:30 AM.  Endorsing mild epigastric tenderness.  Patient no longer has a gallbladder or appendix.   Emesis      Home Medications Prior to Admission medications   Medication Sig Start Date End Date Taking? Authorizing Provider  metoCLOPramide (REGLAN) 10 MG tablet Take 1 tablet (10 mg total) by mouth every 6 (six) hours. 06/21/23  Yes Smitty Knudsen, PA-C  acetaminophen (TYLENOL) 325 MG tablet Take 650 mg by mouth every 6 (six) hours as needed.    [provider]  albuterol (PROVENTIL) (2.5 MG/3ML) 0.083% nebulizer solution Take 3 mLs (2.5 mg total) by nebulization every 6 (six) hours as needed for wheezing or shortness of breath. 11/03/22   Verlee Monte, MD  albuterol (VENTOLIN HFA) 108 (90 Base) MCG/ACT inhaler Inhale 2 puffs into the lungs every 6 (six) hours as needed for wheezing or shortness of breath. 05/10/23   Verlee Monte, MD  amoxicillin-clavulanate (AUGMENTIN) 875-125 MG tablet Take 1 tablet by mouth 2 (two) times daily. 05/11/23   Verlee Monte, MD  azelastine (ASTELIN) 0.1 % nasal spray 1-2 sprays per nostril twice daily for runny nose 05/10/23   Verlee Monte, MD  benzonatate (TESSALON PERLES) 100 MG capsule Take 2 capsules (200 mg total) by mouth 3 (three) times daily as needed for cough. 05/18/22   Arby Barrette, MD  Blood Glucose Monitoring Suppl (ONETOUCH VERIO FLEX SYSTEM) w/Device KIT Use to check blood glucose once a day.  DX code: E11.9 02/06/20   Bradd Canary, MD  budesonide (PULMICORT FLEXHALER) 180 MCG/ACT inhaler 2 puffs once to twice daily to prevent coughing or wheezing. 05/10/23   Verlee Monte, MD  Cholecalciferol ([employer] VIT D 5000 HIGH-POTENCY PO) Take 5,000 Units by mouth daily.    [provider]  cyclobenzaprine (FLEXERIL) 5 MG tablet Take 1 tablet (5 mg total) by mouth 3 (three) times daily as needed for muscle spasms. 10/04/22   Clayborne Dana, NP  enalapril (VASOTEC) 2.5 MG tablet Take 1 tablet (2.5 mg total) by mouth daily. 03/06/23   Bradd Canary, MD  estradiol (ESTRACE VAGINAL) 0.1 MG/GM vaginal cream Place 1 Applicatorful vaginally at bedtime. 04/28/21   Willodean Rosenthal, MD  famotidine (PEPCID) 40 MG tablet Take 1 tablet (40 mg total) by mouth at bedtime. 05/12/22   Bradd Canary, MD  fluticasone (FLONASE) 50 MCG/ACT nasal spray Place 2 sprays into both nostrils daily. 05/10/23   Verlee Monte, MD  furosemide (LASIX) 20 MG tablet 1 tab by mouth daily as needed for  edema, weight gain >3lbs in 24 hours 12/21/22   Hyman Hopes B, NP  glucose blood (ONETOUCH VERIO) test strip TEST BLOOD GLUCOSE ONCE A DAY 11/05/21   Bradd Canary, MD  hyoscyamine (LEVSIN SL) 0.125 MG SL tablet DISSOLVE 1 TABLET(0.125 MG) UNDER THE TONGUE EVERY 4 HOURS AS NEEDED 10/07/22   Bradd Canary, MD  Lancets (ONETOUCH DELICA PLUS LANCET33G) MISC TEST BLOOD SUGAR ONCE A DAY 11/05/21   Bradd Canary, MD  loratadine (CLARITIN REDITABS) 10 MG dissolvable tablet Take 10 mg by mouth daily.    [provider]   meclizine (ANTIVERT) 25 MG tablet Take 1 tablet (25 mg total) by mouth 2 (two) times daily as needed for up to 20 doses for dizziness. Patient not taking: Reported on 05/10/2023 01/02/23   Virgina Norfolk, DO  montelukast (SINGULAIR) 10 MG tablet TAKE 1 TABLET(10 MG) BY MOUTH AT BEDTIME 05/10/23   Verlee Monte, MD  Olopatadine HCl 0.2 % SOLN Place 1 drop in each eye once a day as needed for itchy watery eyes 11/03/22   Verlee Monte, MD  omeprazole (PRILOSEC) 40 MG capsule Take 1 capsule (40 mg total) by mouth daily as needed (reflux). 11/03/22   Verlee Monte, MD  potassium chloride SA (KLOR-CON M) 20 MEQ tablet TAKE 2 TABLETS BY MOUTH DAILY FOR 3 DAYS. THEN TAKE 1 TABLET BY MOUTH DAILY. Patient taking differently: as needed. TAKE 2 TABLETS BY MOUTH DAILY FOR 3 DAYS. THEN TAKE 1 TABLET BY MOUTH DAILY. 11/10/21   Bradd Canary, MD  predniSONE (DELTASONE) 10 MG tablet Take 2 tablets (20 mg total) by mouth daily. 05/11/23   Verlee Monte, MD  rosuvastatin (CRESTOR) 5 MG tablet TAKE 1 TABLET(5 MG) BY MOUTH 2 TIMES A WEEK 03/31/23   Bradd Canary, MD  SYNTHROID 137 MCG tablet Take 1 tablet (137 mcg total) by mouth as directed. 1 tablet 6 days out of the week 02/28/23   Shamleffer, Konrad Dolores, MD  triamterene-hydrochlorothiazide (MAXZIDE-25) 37.5-25 MG tablet TAKE 1 TABLET BY MOUTH DAILY 05/25/23   Bradd Canary, MD      Allergies    Levaquin [levofloxacin], Iodinated contrast media, Clindamycin/lincomycin, Codeine, and Gabapentin    Review of Systems   Review of Systems  Gastrointestinal:  Positive for vomiting.  All other systems reviewed and are negative.   Physical Exam Updated Vital Signs BP 121/67   Pulse 85   Temp 98.6 F (37 C) (Oral)   Resp 16   Ht 5\' 1"  (1.549 m)   Wt 88.5 kg   SpO2 96%   BMI 36.84 kg/m  Physical Exam Vitals and nursing note reviewed.  Constitutional:      General: She is not in acute distress.    Appearance: She is well-developed.  HENT:      Head: Normocephalic and atraumatic.  Eyes:     Conjunctiva/sclera: Conjunctivae normal.  Cardiovascular:     Rate and Rhythm: Normal rate and regular rhythm.     Heart sounds: No murmur heard. Pulmonary:     Effort: Pulmonary effort is normal. No respiratory distress.     Breath sounds: Normal breath sounds.  Abdominal:     General: Abdomen is flat. Bowel sounds are normal.     Palpations: Abdomen is soft.     Tenderness: There is no abdominal tenderness.       Comments: Mild TTP in epigastrium.  Musculoskeletal:        General: No swelling.  Cervical back: Neck supple.  Skin:    General: Skin is warm and dry.     Capillary Refill: Capillary refill takes less than 2 seconds.  Neurological:     Mental Status: She is alert.  Psychiatric:        Mood and Affect: Mood normal.     ED Results / Procedures / Treatments   Labs (all labs ordered are listed, but only abnormal results are displayed) Labs Reviewed  COMPREHENSIVE METABOLIC PANEL - Abnormal; Notable for the following components:      Result Value   Glucose, Bld 109 (*)    Calcium 8.7 (*)    All other components within normal limits  CBC WITH DIFFERENTIAL/PLATELET - Abnormal; Notable for the following components:   WBC 14.5 (*)    Neutro Abs 12.5 (*)    All other components within normal limits  LIPASE, BLOOD  URINALYSIS, ROUTINE W REFLEX MICROSCOPIC  LACTIC ACID, PLASMA  LACTIC ACID, PLASMA  TROPONIN I (HIGH SENSITIVITY)  TROPONIN I (HIGH SENSITIVITY)    EKG EKG Interpretation Date/Time:  Wednesday June 21 2023 12:48:39 EST Ventricular Rate:  106 PR Interval:  146 QRS Duration:  88 QT Interval:  380 QTC Calculation: 505 R Axis:   -10  Text Interpretation: Sinus tachycardia Atrial premature complex Borderline T wave abnormalities Prolonged QT interval increased rate from prior 6/24 Confirmed by Meridee Score [phone]) on 06/21/2023 12:59:58 PM  Radiology CT ABDOMEN PELVIS WO CONTRAST  Result  Date: 06/21/2023 CLINICAL DATA:  Abdominal pain EXAM: CT ABDOMEN AND PELVIS WITHOUT CONTRAST TECHNIQUE: Multidetector CT imaging of the abdomen and pelvis was performed following the standard protocol without IV contrast. RADIATION DOSE REDUCTION: This exam was performed according to the departmental dose-optimization program which includes automated exposure control, adjustment of the mA and/or kV according to patient size and/or use of iterative reconstruction technique. COMPARISON:  None Available. FINDINGS: Lower chest: There is some linear opacity lung bases likely scar or atelectasis. No pleural effusion. Coronary artery calcifications are seen. 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 Small hiatal hernia. Hepatobiliary: Previous cholecystectomy. There is a low-attenuation lesion in the right hepatic lobe measuring 4.1 cm. Hounsfield unit of 2 consistent with a benign cyst. No specific imaging follow-up. Pancreas: Moderate atrophy of the pancreas. Spleen: Normal in size without focal abnormality. Adrenals/Urinary Tract: Adrenal glands are preserved. No abnormal calcifications are seen within either kidney nor along the course of either ureter. 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 Tiny low-attenuation upper pole right-sided renal lesion, too small to completely characterize but likely a benign cystic focus. Preserved contours of the urinary bladder. Stomach/Bowel: Large bowel has a normal course and caliber on this non oral contrast exam. Few scattered colonic diverticula. Stomach and small bowel are nondilated. The appendix is not well seen in the right lower quadrant but no pericecal stranding or fluid. Vascular/Lymphatic: Normal caliber aorta and IVC with scattered vascular calcifications. There is circumaortic left renal vein. Specific abnormal lymph node enlargement seen in the  abdomen and pelvis. Reproductive: Status post hysterectomy. No adnexal masses. Other: Central mesenteric stranding identified nonspecific. Musculoskeletal: Degenerative changes along the spine and pelvis. IMPRESSION: No obstructing renal stone. 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 There is a tiny low-attenuation lesion along the upper pole of the right kidney which could be a small cyst but indeterminate. Confirmatory ultrasound may be  useful as the next step in the workup. Few colonic diverticula.  No bowel obstruction.  Small hiatal hernia Central mesenteric haziness identified. Favor a benign process but recommend comparison to prior or follow up imaging in 6 months to confirm a benign process. Electronically Signed   By: Karen Kays M.D.   On: 06/21/2023 16:55    Procedures Procedures   Medications Ordered in ED Medications  iohexol (OMNIPAQUE) 300 MG/ML solution 100 mL ( Intravenous Canceled Entry 06/21/23 1538)  sodium chloride 0.9 % bolus 1,000 mL (0 mLs Intravenous Stopped 06/21/23 1639)  ondansetron (ZOFRAN) injection 4 mg (4 mg Intravenous Given 06/21/23 1411)  alum & mag hydroxide-simeth (MAALOX/MYLANTA) 200-200-20 MG/5ML suspension 15 mL (15 mLs Oral Given 06/21/23 1409)    ED Course/ Medical Decision Making/ A&P Clinical Course as of 06/21/23 1835  Wed Jun 21, 2023  1424 She is here with a complaint of bandlike tightness of her upper abdomen along with nausea and vomiting that started at 3 AM.  She has an elevated white count normal LFTs.  She had her gallbladder out along with multiple other surgeries.  Likely will need some imaging.  Disposition per results of testing [MB]    Clinical Course User Index [MB] Terrilee Files, MD                               Medical Decision Making Amount and/or Complexity of Data Reviewed Labs: ordered. Radiology: ordered.  Risk OTC drugs. Prescription drug management.   This patient  presents to the ED for concern of vomiting.  Differential diagnosis includes gastroenteritis, bowel obstruction, UTI, pancreatitis   Lab Tests:  I Ordered, and personally interpreted labs.  The pertinent results include: CBC with mild leukocytosis of 14.5, CMP largely unremarkable, lipase negative at 26, lactic acid at 1.5, troponin 2, urinalysis without signs of infection   Imaging Studies ordered:  I ordered imaging studies including the CT abdomen pelvis I independently visualized and interpreted imaging which showed no obstructing renal stone. 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 There is a tiny low-attenuation lesion along the upper pole of the right kidney which could be a small cyst but indeterminate. Confirmatory ultrasound may be useful as the next step in the workup. Few colonic diverticula.  No bowel obstruction.  Small hiatal hernia Central mesenteric haziness identified. Favor a benign process but recommend comparison to prior or follow up imaging in 6 months to confirm a benign process. I agree with the radiologist interpretation   Medicines ordered and prescription drug management:  I ordered medication including fluids, Zofran, Maalox for dehydration, nausea, GERD Reevaluation of the patient after these medicines showed that the patient improved I have reviewed the patients home medicines and have made adjustments as needed   Problem List / ED Course:  Patient with past history significant for hypertension, hyperlipidemia, GERD, hypothyroidism, IBS, obesity, type 2 diabetes presents to the emergency department with concerns of vomiting.  Reports that she woke up around 3 AM this morning and had an episode of vomiting with previously experiencing some acid reflux type symptoms around 1 AM.  Since this, patient has had a total of 4 episodes of vomiting.  Reports that she still able to occasionally keep some water down but is having  some difficulty due to the nausea.  Denies any recent fevers, diarrhea, chest pain or shortness of breath.  The abdominal  pain is a bandlike tension feeling in the upper abdomen.  Patient has previously had cholecystectomy and appendectomy.  Denies any current urinary symptoms such as dysuria, hematuria, increased urinary frequency or urgency. Labs are largely reassuring. Mild leukocytosis but this could vomiting/pain related. No acute findings to suggest dehydration. However, given age and mild leukocytosis, will obtain CTAP for assessment of pain. UA without signs of infection. CTPA is unremarkable for any acute findings. Incidental finding of possible angiomyolipoma and possible cyst on the upper pole of the right kidney. Otherwise unremarkable labs and imaging. Patient appears comfortable and has not had any recurrence of her nausea of vomiting. Patient agreeable with plan for discharge home and verbalized understanding all return precautions. Reglan sent to pharmacy for nausea control given Qtc prolonged on EKG at >500. Encouraged close PCP follow up. Given reassuring workup, discharged home in stable condition.   Final Clinical Impression(s) / ED Diagnoses Final diagnoses:  Gastroenteritis    Rx / DC Orders ED Discharge Orders          Ordered    metoCLOPramide (REGLAN) 10 MG tablet  Every 6 hours        06/21/23 1747              Smitty Knudsen, PA-C 06/21/23 1836    Terrilee Files, MD 06/22/23 1036

## 2023-06-21 NOTE — Discharge Instructions (Signed)
You were seen in the ER today with concerns of vomiting. Your labs and imaging are reassuring without any specific abnormalities that appears to be causing these symptoms. You were given fluids and nausea medicine which appeared to have helped. I suspect you may have gastroenteritis given your acute onset of symptoms and would encouraged you to focus on hydration at home. I have sent a prescription for nausea medicine to your pharmacy. If symptoms are worsening, please return to the ER. Otherwise, follow up with your primary care provider.

## 2023-06-21 NOTE — ED Notes (Signed)
Pt provided bedside commode, declined assistance

## 2023-06-21 NOTE — ED Triage Notes (Signed)
Pt c/o vomiting since 0330; c/o upper middle abd pain

## 2023-07-02 ENCOUNTER — Other Ambulatory Visit: Payer: Self-pay | Admitting: Family Medicine

## 2023-07-05 NOTE — Assessment & Plan Note (Signed)
Encourage heart healthy diet such as MIND or DASH diet, increase exercise, avoid trans fats, simple carbohydrates and processed foods, consider a krill or fish or flaxseed oil cap daily.  Tolerating Rosuvastatin 

## 2023-07-05 NOTE — Assessment & Plan Note (Signed)
Encouraged DASH or MIND diet, decrease po intake and increase exercise as tolerated. Needs 7-8 hours of sleep nightly. Avoid trans fats, eat small, frequent meals every 4-5 hours with lean proteins, complex carbs and healthy fats. Minimize simple carbs, high fat foods and processed foods 

## 2023-07-05 NOTE — Assessment & Plan Note (Signed)
hgba1c acceptable, minimize simple carbs. Increase exercise as tolerated. Continue current meds 

## 2023-07-05 NOTE — Assessment & Plan Note (Signed)
Well controlled, no changes to meds. Encouraged heart healthy diet such as the DASH diet and exercise as tolerated.  °

## 2023-07-05 NOTE — Assessment & Plan Note (Signed)
Supplement and monitor 

## 2023-07-06 ENCOUNTER — Ambulatory Visit: Payer: Medicare Other | Admitting: Family Medicine

## 2023-07-06 ENCOUNTER — Telehealth (HOSPITAL_BASED_OUTPATIENT_CLINIC_OR_DEPARTMENT_OTHER): Payer: Self-pay | Admitting: Family Medicine

## 2023-07-06 VITALS — BP 132/68 | HR 77 | Temp 97.8°F | Resp 18 | Ht 61.0 in | Wt 199.6 lb

## 2023-07-06 DIAGNOSIS — I10 Essential (primary) hypertension: Secondary | ICD-10-CM | POA: Diagnosis not present

## 2023-07-06 DIAGNOSIS — E669 Obesity, unspecified: Secondary | ICD-10-CM

## 2023-07-06 DIAGNOSIS — K635 Polyp of colon: Secondary | ICD-10-CM

## 2023-07-06 DIAGNOSIS — K219 Gastro-esophageal reflux disease without esophagitis: Secondary | ICD-10-CM

## 2023-07-06 DIAGNOSIS — E890 Postprocedural hypothyroidism: Secondary | ICD-10-CM

## 2023-07-06 DIAGNOSIS — E7800 Pure hypercholesterolemia, unspecified: Secondary | ICD-10-CM | POA: Diagnosis not present

## 2023-07-06 DIAGNOSIS — K449 Diaphragmatic hernia without obstruction or gangrene: Secondary | ICD-10-CM | POA: Diagnosis not present

## 2023-07-06 DIAGNOSIS — E119 Type 2 diabetes mellitus without complications: Secondary | ICD-10-CM

## 2023-07-06 DIAGNOSIS — N2889 Other specified disorders of kidney and ureter: Secondary | ICD-10-CM

## 2023-07-06 LAB — COMPREHENSIVE METABOLIC PANEL
ALT: 15 U/L (ref 0–35)
AST: 19 U/L (ref 0–37)
Albumin: 4.1 g/dL (ref 3.5–5.2)
Alkaline Phosphatase: 78 U/L (ref 39–117)
BUN: 11 mg/dL (ref 6–23)
CO2: 30 meq/L (ref 19–32)
Calcium: 9.4 mg/dL (ref 8.4–10.5)
Chloride: 100 meq/L (ref 96–112)
Creatinine, Ser: 0.79 mg/dL (ref 0.40–1.20)
GFR: 73.71 mL/min (ref >60.00)
Glucose, Bld: 93 mg/dL (ref 70–99)
Potassium: 3.5 meq/L (ref 3.5–5.1)
Sodium: 140 meq/L (ref 135–145)
Total Bilirubin: 0.3 mg/dL (ref 0.2–1.2)
Total Protein: 6.8 g/dL (ref 6.0–8.3)

## 2023-07-06 LAB — CBC WITH DIFFERENTIAL/PLATELET
Basophils Absolute: 0 10*3/uL (ref 0.0–0.1)
Basophils Relative: 0.5 % (ref 0.0–3.0)
Eosinophils Absolute: 0.2 10*3/uL (ref 0.0–0.7)
Eosinophils Relative: 3.2 % (ref 0.0–5.0)
HCT: 40 % (ref 36.0–46.0)
Hemoglobin: 12.9 g/dL (ref 12.0–15.0)
Lymphocytes Relative: 37.2 % (ref 12.0–46.0)
Lymphs Abs: 2.8 10*3/uL (ref 0.7–4.0)
MCHC: 32.3 g/dL (ref 30.0–36.0)
MCV: 93.6 fL (ref 78.0–100.0)
Monocytes Absolute: 0.5 10*3/uL (ref 0.1–1.0)
Monocytes Relative: 7.1 % (ref 3.0–12.0)
Neutro Abs: 3.9 10*3/uL (ref 1.4–7.7)
Neutrophils Relative %: 52 % (ref 43.0–77.0)
Platelets: 313 10*3/uL (ref 150.0–400.0)
RBC: 4.27 Mil/uL (ref 3.87–5.11)
RDW: 13.5 % (ref 11.5–15.5)
WBC: 7.5 10*3/uL (ref 4.0–10.5)

## 2023-07-06 LAB — LIPID PANEL
Cholesterol: 164 mg/dL (ref 0–200)
HDL: 56.6 mg/dL (ref >39.00)
LDL Cholesterol: 88 mg/dL (ref 0–99)
NonHDL: 107.46
Total CHOL/HDL Ratio: 3
Triglycerides: 97 mg/dL (ref 0.0–149.0)
VLDL: 19.4 mg/dL (ref 0.0–40.0)

## 2023-07-06 LAB — MICROALBUMIN / CREATININE URINE RATIO
Creatinine,U: 100.9 mg/dL
Microalb Creat Ratio: 1.1 mg/g (ref 0.0–30.0)
Microalb, Ur: 1.1 mg/dL (ref 0.0–1.9)

## 2023-07-06 LAB — VITAMIN D 25 HYDROXY (VIT D DEFICIENCY, FRACTURES): VITD: 26.29 ng/mL — ABNORMAL LOW (ref 30.00–100.00)

## 2023-07-06 LAB — HEMOGLOBIN A1C: Hgb A1c MFr Bld: 6.8 % — ABNORMAL HIGH (ref 4.6–6.5)

## 2023-07-06 LAB — TSH: TSH: 0.29 u[IU]/mL — ABNORMAL LOW (ref 0.35–5.50)

## 2023-07-06 MED ORDER — OMEPRAZOLE 40 MG PO CPDR: 40.0000 mg | DELAYED_RELEASE_CAPSULE | Freq: Every day | ORAL | 3 refills | Status: DC | PRN

## 2023-07-06 MED ORDER — PULMICORT FLEXHALER 180 MCG/ACT IN AEPB: INHALATION_SPRAY | RESPIRATORY_TRACT | 0 refills | Status: DC

## 2023-07-06 MED ORDER — FAMOTIDINE 40 MG PO TABS: 40.0000 mg | ORAL_TABLET | Freq: Every day | ORAL | 3 refills | Status: DC

## 2023-07-06 MED ORDER — HYOSCYAMINE SULFATE 0.125 MG SL SUBL: 0.1250 mg | SUBLINGUAL_TABLET | SUBLINGUAL | 2 refills | Status: AC | PRN

## 2023-07-06 NOTE — Patient Instructions (Signed)
Hypertension, Adult High blood pressure (hypertension) is when the force of blood pumping through the arteries is too strong. The arteries are the blood vessels that carry blood from the heart throughout the body. Hypertension forces the heart to work harder to pump blood and may cause arteries to become narrow or stiff. Untreated or uncontrolled hypertension can lead to a heart attack, heart failure, a stroke, kidney disease, and other problems. A blood pressure reading consists of a higher number over a lower number. Ideally, your blood pressure should be below 120/80. The first ("top") number is called the systolic pressure. It is a measure of the pressure in your arteries as your heart beats. The second ("bottom") number is called the diastolic pressure. It is a measure of the pressure in your arteries as the heart relaxes. What are the causes? The exact cause of this condition is not known. There are some conditions that result in high blood pressure. What increases the risk? Certain factors may make you more likely to develop high blood pressure. Some of these risk factors are under your control, including: Smoking. Not getting enough exercise or physical activity. Being overweight. Having too much fat, sugar, calories, or salt (sodium) in your diet. Drinking too much alcohol. Other risk factors include: Having a personal history of heart disease, diabetes, high cholesterol, or kidney disease. Stress. Having a family history of high blood pressure and high cholesterol. Having obstructive sleep apnea. Age. The risk increases with age. What are the signs or symptoms? High blood pressure may not cause symptoms. Very high blood pressure (hypertensive crisis) may cause: Headache. Fast or irregular heartbeats (palpitations). Shortness of breath. Nosebleed. Nausea and vomiting. Vision changes. Severe chest pain, dizziness, and seizures. How is this diagnosed? This condition is diagnosed by  measuring your blood pressure while you are seated, with your arm resting on a flat surface, your legs uncrossed, and your feet flat on the floor. The cuff of the blood pressure monitor will be placed directly against the skin of your upper arm at the level of your heart. Blood pressure should be measured at least twice using the same arm. Certain conditions can cause a difference in blood pressure between your right and left arms. If you have a high blood pressure reading during one visit or you have normal blood pressure with other risk factors, you may be asked to: Return on a different day to have your blood pressure checked again. Monitor your blood pressure at home for 1 week or longer. If you are diagnosed with hypertension, you may have other blood or imaging tests to help your health care provider understand your overall risk for other conditions. How is this treated? This condition is treated by making healthy lifestyle changes, such as eating healthy foods, exercising more, and reducing your alcohol intake. You may be referred for counseling on a healthy diet and physical activity. Your health care provider may prescribe medicine if lifestyle changes are not enough to get your blood pressure under control and if: Your systolic blood pressure is above 130. Your diastolic blood pressure is above 80. Your personal target blood pressure may vary depending on your medical conditions, your age, and other factors. Follow these instructions at home: Eating and drinking  Eat a diet that is high in fiber and potassium, and low in sodium, added sugar, and fat. An example of this eating plan is called the DASH diet. DASH stands for Dietary Approaches to Stop Hypertension. To eat this way: Eat   plenty of fresh fruits and vegetables. Try to fill one half of your plate at each meal with fruits and vegetables. Eat whole grains, such as whole-wheat pasta, brown rice, or whole-grain bread. Fill about one  fourth of your plate with whole grains. Eat or drink low-fat dairy products, such as skim milk or low-fat yogurt. Avoid fatty cuts of meat, processed or cured meats, and poultry with skin. Fill about one fourth of your plate with lean proteins, such as fish, chicken without skin, beans, eggs, or tofu. Avoid pre-made and processed foods. These tend to be higher in sodium, added sugar, and fat. Reduce your daily sodium intake. Many people with hypertension should eat less than 1,500 mg of sodium a day. Do not drink alcohol if: Your health care provider tells you not to drink. You are pregnant, may be pregnant, or are planning to become pregnant. If you drink alcohol: Limit how much you have to: 0-1 drink a day for women. 0-2 drinks a day for men. Know how much alcohol is in your drink. In the [place] one drink equals one 12 oz bottle of beer (355 mL), one 5 oz glass of wine (148 mL), or one 1 oz glass of hard liquor (44 mL). Lifestyle  Work with your health care provider to maintain a healthy body weight or to lose weight. Ask what an ideal weight is for you. Get at least 30 minutes of exercise that causes your heart to beat faster (aerobic exercise) most days of the week. Activities may include walking, swimming, or biking. Include exercise to strengthen your muscles (resistance exercise), such as Pilates or lifting weights, as part of your weekly exercise routine. Try to do these types of exercises for 30 minutes at least 3 days a week. Do not use any products that contain nicotine or tobacco. These products include cigarettes, chewing tobacco, and vaping devices, such as e-cigarettes. If you need help quitting, ask your health care provider. Monitor your blood pressure at home as told by your health care provider. Keep all follow-up visits. This is important. Medicines Take over-the-counter and prescription medicines only as told by your health care provider. Follow directions carefully. Blood  pressure medicines must be taken as prescribed. Do not skip doses of blood pressure medicine. Doing this puts you at risk for problems and can make the medicine less effective. Ask your health care provider about side effects or reactions to medicines that you should watch for. Contact a health care provider if you: Think you are having a reaction to a medicine you are taking. Have headaches that keep coming back (recurring). Feel dizzy. Have swelling in your ankles. Have trouble with your vision. Get help right away if you: Develop a severe headache or confusion. Have unusual weakness or numbness. Feel faint. Have severe pain in your chest or abdomen. Vomit repeatedly. Have trouble breathing. These symptoms may be an emergency. Get help right away. Call 911. Do not wait to see if the symptoms will go away. Do not drive yourself to the hospital. Summary Hypertension is when the force of blood pumping through your arteries is too strong. If this condition is not controlled, it may put you at risk for serious complications. Your personal target blood pressure may vary depending on your medical conditions, your age, and other factors. For most people, a normal blood pressure is less than 120/80. Hypertension is treated with lifestyle changes, medicines, or a combination of both. Lifestyle changes include losing weight, eating a healthy,   low-sodium diet, exercising more, and limiting alcohol. This information is not intended to replace advice given to you by your health care provider. Make sure you discuss any questions you have with your health care provider. Document Revised: 05/11/2021 Document Reviewed: 05/11/2021 [employer] Patient Education  2024 [employer]  

## 2023-07-07 ENCOUNTER — Other Ambulatory Visit: Payer: Self-pay | Admitting: *Deleted

## 2023-07-07 DIAGNOSIS — R7989 Other specified abnormal findings of blood chemistry: Secondary | ICD-10-CM

## 2023-07-08 ENCOUNTER — Ambulatory Visit (HOSPITAL_BASED_OUTPATIENT_CLINIC_OR_DEPARTMENT_OTHER)
Admission: RE | Admit: 2023-07-08 | Discharge: 2023-07-08 | Disposition: A | Discharge status: Home / Self Care | Payer: Medicare Other | Source: Ambulatory Visit | Attending: Family Medicine | Admitting: Family Medicine

## 2023-07-08 DIAGNOSIS — N2889 Other specified disorders of kidney and ureter: Secondary | ICD-10-CM | POA: Diagnosis not present

## 2023-07-08 DIAGNOSIS — K5730 Diverticulosis of large intestine without perforation or abscess without bleeding: Secondary | ICD-10-CM | POA: Diagnosis not present

## 2023-07-08 DIAGNOSIS — N289 Disorder of kidney and ureter, unspecified: Secondary | ICD-10-CM | POA: Diagnosis not present

## 2023-07-08 DIAGNOSIS — K7689 Other specified diseases of liver: Secondary | ICD-10-CM | POA: Diagnosis not present

## 2023-07-09 ENCOUNTER — Encounter: Payer: Self-pay | Admitting: Family Medicine

## 2023-07-09 NOTE — Progress Notes (Signed)
Subjective:    Patient ID: Monica Wallace, female    DOB: Jul 26, 1950, 72 y.o.   MRN: [id number]  Chief Complaint  Patient presents with  . Follow-up    HPI Discussed the use of AI scribe software for clinical note transcription with the patient, who gave verbal consent to proceed.  History of Present Illness   The patient, with a history of COVID-19, bronchitis, gastroenteritis, and irritable bowel syndrome (IBS), presents with worsening reflux and a persistent cough. The patient reports that the reflux has been particularly severe since their visit to the emergency room for gastroenteritis, causing significant throat irritation and a persistent cough. The patient is currently managing the reflux with over-the-counter omeprazole and famotidine, but reports that the symptoms have not improved.  The patient also reports ongoing balance issues, which are being managed by another doctor. The patient is unsure if the balance issues are related to their ears or another underlying condition.  The patient is currently on enalapril for blood pressure management and reports no issues with this medication. The patient also mentions a history of a hiatal hernia and a recent CT scan that revealed a benign cyst in the liver, atrophy of the pancreas, and a small lesion in the right kidney.        Past Medical History:  Diagnosis Date  . Allergy History of food and drug  . Arthritis   . Asthma   . Baker's cyst of knee, right 02/16/2017  . Cancer (HCC) 2002   thyroid (2002)  . Cataract History surgery 2017  . Cystic breast   . Diabetes (HCC)   . GERD (gastroesophageal reflux disease)   . Hepatic cyst 02/02/2015  . Hyperlipidemia   . Hypertension   . Insomnia 07/23/2016  . Menopause   . Migraine   . Palpitations   . Preventative health care 01/18/2016  . Sleep apnea 02/02/2015  . Thyroid disease     Past Surgical History:  Procedure Laterality Date  . ABDOMINAL HYSTERECTOMY     took  right ovary and uterus  . APPENDECTOMY    . CARPAL TUNNEL RELEASE Right   . CESAREAN SECTION    . CHOLECYSTECTOMY    . EYE SURGERY Bilateral 2017   cataracts  . KNEE ARTHROPLASTY    . SHOULDER ARTHROSCOPY W/ ROTATOR CUFF REPAIR Left   . THYROIDECTOMY  2002  . TUBAL LIGATION      Family History  Problem Relation Age of Onset  . Arthritis Mother   . Transient ischemic attack Mother   . Hypertension Mother   . Hyperlipidemia Mother   . Diabetes Father   . Heart disease Father   . Arthritis Father   . Kidney disease Father   . Hypertension Father   . Hyperlipidemia Father   . Cancer Sister 12       pancreatic  . Fibromyalgia Sister   . Allergic rhinitis Sister   . Asthma Maternal Grandfather        smoker  . Depression Maternal Grandfather   . Dementia Paternal Grandmother   . Diabetes Paternal Grandfather   . Hypertension Brother   . Allergic rhinitis Brother   . Scoliosis Daughter        had rods placed and removed  . Arthritis Sister   . Gout Sister   . GER disease Sister   . Cancer Sister        breast cancer  . Breast cancer Sister 32    Social History  Socioeconomic History  . Marital status: Divorced    Spouse name: Not on file  . Number of children: 2  . Years of education: Not on file  . Highest education level: Master's degree (e.g., MA, MS, MEng, MEd, MSW, MBA)  Occupational History  . Not on file  Tobacco Use  . Smoking status: Never  . Smokeless tobacco: Never  Vaping Use  . Vaping status: Never Used  Substance and Sexual Activity  . Alcohol use: Yes    Comment: A few times a year  . Drug use: No  . Sexual activity: Yes    Birth control/protection: Post-menopausal, Surgical, Other-see comments    Comment:  boyfriend  Other Topics Concern  . Not on file  Social History Narrative   Household: pt, daughter and sister    Social Drivers of Corporate investment banker Strain: High Risk (07/02/2023)   Overall Financial Resource Strain  (CARDIA)   . Difficulty of Paying Living Expenses: Hard  Food Insecurity: No Food Insecurity (07/02/2023)   Hunger Vital Sign   . Worried About Programme researcher, broadcasting/film/video in the Last Year: Never true   . Ran Out of Food in the Last Year: Never true  Transportation Needs: No Transportation Needs (07/02/2023)   PRAPARE - Transportation   . Lack of Transportation (Medical): No   . Lack of Transportation (Non-Medical): No  Physical Activity: Insufficiently Active (07/02/2023)   Exercise Vital Sign   . Days of Exercise per Week: 2 days   . Minutes of Exercise per Session: 10 min  Stress: No Stress Concern Present (07/02/2023)   [employer] of Occupational Health - Occupational Stress Questionnaire   . Feeling of Stress : Only a little  Social Connections: Moderately Isolated (07/02/2023)   Social Connection and Isolation Panel [NHANES]   . Frequency of Communication with Friends and Family: More than three times a week   . Frequency of Social Gatherings with Friends and Family: More than three times a week   . Attends Religious Services: More than 4 times per year   . Active Member of Clubs or Organizations: No   . Attends Banker Meetings: Never   . Marital Status: Divorced  Catering manager Violence: Not At Risk (04/04/2022)   Humiliation, Afraid, Rape, and Kick questionnaire   . Fear of Current or Ex-Partner: No   . Emotionally Abused: No   . Physically Abused: No   . Sexually Abused: No    Outpatient Medications Prior to Visit  Medication Sig Dispense Refill  . acetaminophen (TYLENOL) 325 MG tablet Take 650 mg by mouth every 6 (six) hours as needed.    Marland Kitchen albuterol (PROVENTIL) (2.5 MG/3ML) 0.083% nebulizer solution Take 3 mLs (2.5 mg total) by nebulization every 6 (six) hours as needed for wheezing or shortness of breath. 75 mL 0  . albuterol (VENTOLIN HFA) 108 (90 Base) MCG/ACT inhaler Inhale 2 puffs into the lungs every 6 (six) hours as needed for wheezing or shortness  of breath. 18 g 1  . azelastine (ASTELIN) 0.1 % nasal spray 1-2 sprays per nostril twice daily for runny nose 30 mL 5  . benzonatate (TESSALON PERLES) 100 MG capsule Take 2 capsules (200 mg total) by mouth 3 (three) times daily as needed for cough. 30 capsule 0  . Blood Glucose Monitoring Suppl (ONETOUCH VERIO FLEX SYSTEM) w/Device KIT Use to check blood glucose once a day.  DX code: E11.9 1 kit 0  . Cholecalciferol ([employer] VIT D  5000 HIGH-POTENCY PO) Take 5,000 Units by mouth daily.    . cyclobenzaprine (FLEXERIL) 5 MG tablet Take 1 tablet (5 mg total) by mouth 3 (three) times daily as needed for muscle spasms. 30 tablet 1  . enalapril (VASOTEC) 2.5 MG tablet Take 1 tablet (2.5 mg total) by mouth daily. 30 tablet 3  . estradiol (ESTRACE VAGINAL) 0.1 MG/GM vaginal cream Place 1 Applicatorful vaginally at bedtime. 42.5 g 12  . fluticasone (FLONASE) 50 MCG/ACT nasal spray Place 2 sprays into both nostrils daily. 16 g 5  . furosemide (LASIX) 20 MG tablet 1 tab by mouth daily as needed for edema, weight gain >3lbs in 24 hours 30 tablet 3  . glucose blood (ONETOUCH VERIO) test strip USE TO TEST BLOOD GLUCOSE ONCE DAILY AS DIRECTED 100 strip 1  . Lancets (ONETOUCH DELICA PLUS LANCET33G) MISC TEST BLOOD SUGAR ONCE A DAY 100 each 1  . loratadine (CLARITIN REDITABS) 10 MG dissolvable tablet Take 10 mg by mouth daily.    . meclizine (ANTIVERT) 25 MG tablet Take 1 tablet (25 mg total) by mouth 2 (two) times daily as needed for up to 20 doses for dizziness. 20 tablet 0  . metoCLOPramide (REGLAN) 10 MG tablet Take 1 tablet (10 mg total) by mouth every 6 (six) hours. 30 tablet 0  . montelukast (SINGULAIR) 10 MG tablet TAKE 1 TABLET(10 MG) BY MOUTH AT BEDTIME 90 tablet 1  . Olopatadine HCl 0.2 % SOLN Place 1 drop in each eye once a day as needed for itchy watery eyes 2.5 mL 5  . potassium chloride SA (KLOR-CON M) 20 MEQ tablet TAKE 2 TABLETS BY MOUTH DAILY FOR 3 DAYS. THEN TAKE 1 TABLET BY MOUTH DAILY. (Patient  taking differently: as needed. TAKE 2 TABLETS BY MOUTH DAILY FOR 3 DAYS. THEN TAKE 1 TABLET BY MOUTH DAILY.) 30 tablet 3  . rosuvastatin (CRESTOR) 5 MG tablet TAKE 1 TABLET(5 MG) BY MOUTH 2 TIMES A WEEK 25 tablet 1  . SYNTHROID 137 MCG tablet Take 1 tablet (137 mcg total) by mouth as directed. 1 tablet 6 days out of the week 78 tablet 3  . triamterene-hydrochlorothiazide (MAXZIDE-25) 37.5-25 MG tablet TAKE 1 TABLET BY MOUTH DAILY 90 tablet 0  . budesonide (PULMICORT FLEXHALER) 180 MCG/ACT inhaler 2 puffs once to twice daily to prevent coughing or wheezing. 1 each 5  . famotidine (PEPCID) 40 MG tablet Take 1 tablet (40 mg total) by mouth at bedtime. 40 tablet 3  . hyoscyamine (LEVSIN SL) 0.125 MG SL tablet DISSOLVE 1 TABLET(0.125 MG) UNDER THE TONGUE EVERY 4 HOURS AS NEEDED 30 tablet 1  . omeprazole (PRILOSEC) 40 MG capsule Take 1 capsule (40 mg total) by mouth daily as needed (reflux). 30 capsule 5  . predniSONE (DELTASONE) 10 MG tablet Take 2 tablets (20 mg total) by mouth daily. (Patient not taking: Reported on 07/06/2023) 8 tablet 0  . amoxicillin-clavulanate (AUGMENTIN) 875-125 MG tablet Take 1 tablet by mouth 2 (two) times daily. (Patient not taking: Reported on 07/06/2023) 20 tablet 0   No facility-administered medications prior to visit.    Allergies  Allergen Reactions  . Levaquin [Levofloxacin] Shortness Of Breath  . Iodinated Contrast Media Itching  . Clindamycin/Lincomycin Other (See Comments)    Pruritus No rash  . Codeine Itching  . Gabapentin Itching    Review of Systems  Constitutional:  Positive for malaise/fatigue. Negative for fever.  HENT:  Negative for congestion.   Eyes:  Negative for blurred vision.  Respiratory:  Negative for shortness of breath.   Cardiovascular:  Negative for chest pain, palpitations and leg swelling.  Gastrointestinal:  Positive for abdominal pain and heartburn. Negative for blood in stool and nausea.  Genitourinary:  Positive for frequency.  Negative for dysuria.  Musculoskeletal:  Negative for falls.  Skin:  Negative for rash.  Neurological:  Negative for dizziness, loss of consciousness and headaches.  Endo/Heme/Allergies:  Negative for environmental allergies.  Psychiatric/Behavioral:  Negative for depression. The patient is not nervous/anxious.       Objective:    Physical Exam Constitutional:      General: She is not in acute distress.    Appearance: Normal appearance. She is well-developed. She is not toxic-appearing.  HENT:     Head: Normocephalic and atraumatic.     Right Ear: External ear normal.     Left Ear: External ear normal.     Nose: Nose normal.  Eyes:     General:        Right eye: No discharge.        Left eye: No discharge.     Conjunctiva/sclera: Conjunctivae normal.  Neck:     Thyroid: No thyromegaly.  Cardiovascular:     Rate and Rhythm: Normal rate and regular rhythm.     Heart sounds: Normal heart sounds. No murmur heard. Pulmonary:     Effort: Pulmonary effort is normal. No respiratory distress.     Breath sounds: Normal breath sounds.  Abdominal:     General: Bowel sounds are normal.     Palpations: Abdomen is soft.     Tenderness: There is no abdominal tenderness. There is no guarding.  Musculoskeletal:        General: Normal range of motion.     Cervical back: Neck supple.  Lymphadenopathy:     Cervical: No cervical adenopathy.  Skin:    General: Skin is warm and dry.  Neurological:     Mental Status: She is alert and oriented to person, place, and time.  Psychiatric:        Mood and Affect: Mood normal.        Behavior: Behavior normal.        Thought Content: Thought content normal.        Judgment: Judgment normal.   BP 132/68 (BP Location: Right Leg, Patient Position: Sitting, Cuff Size: Large)   Pulse 77   Temp 97.8 F (36.6 C) (Oral)   Resp 18   Ht 5\' 1"  (1.549 m)   Wt 199 lb 9.6 oz (90.5 kg)   SpO2 99%   BMI 37.71 kg/m  Wt Readings from Last 3 Encounters:   07/06/23 199 lb 9.6 oz (90.5 kg)  06/21/23 195 lb (88.5 kg)  02/27/23 204 lb (92.5 kg)    Diabetic Foot Exam - Simple   No data filed    Lab Results  Component Value Date   WBC 7.5 07/06/2023   HGB 12.9 07/06/2023   HCT 40.0 07/06/2023   PLT 313.0 07/06/2023   GLUCOSE 93 07/06/2023   CHOL 164 07/06/2023   TRIG 97.0 07/06/2023   HDL 56.60 07/06/2023   LDLDIRECT 105.0 12/06/2022   LDLCALC 88 07/06/2023   ALT 15 07/06/2023   AST 19 07/06/2023   NA 140 07/06/2023   K 3.5 07/06/2023   CL 100 07/06/2023   CREATININE 0.79 07/06/2023   BUN 11 07/06/2023   CO2 30 07/06/2023   TSH 0.29 (L) 07/06/2023   INR 1.0 06/18/2021   HGBA1C 6.8 (  H) 07/06/2023   MICROALBUR 1.1 07/06/2023    Lab Results  Component Value Date   TSH 0.29 (L) 07/06/2023   Lab Results  Component Value Date   WBC 7.5 07/06/2023   HGB 12.9 07/06/2023   HCT 40.0 07/06/2023   MCV 93.6 07/06/2023   PLT 313.0 07/06/2023   Lab Results  Component Value Date   NA 140 07/06/2023   K 3.5 07/06/2023   CO2 30 07/06/2023   GLUCOSE 93 07/06/2023   BUN 11 07/06/2023   CREATININE 0.79 07/06/2023   BILITOT 0.3 07/06/2023   ALKPHOS 78 07/06/2023   AST 19 07/06/2023   ALT 15 07/06/2023   PROT 6.8 07/06/2023   ALBUMIN 4.1 07/06/2023   CALCIUM 9.4 07/06/2023   ANIONGAP 12 06/21/2023   EGFR 81 06/17/2021   GFR 73.71 07/06/2023   Lab Results  Component Value Date   CHOL 164 07/06/2023   Lab Results  Component Value Date   HDL 56.60 07/06/2023   Lab Results  Component Value Date   LDLCALC 88 07/06/2023   Lab Results  Component Value Date   TRIG 97.0 07/06/2023   Lab Results  Component Value Date   CHOLHDL 3 07/06/2023   Lab Results  Component Value Date   HGBA1C 6.8 (H) 07/06/2023       Assessment & Plan:  Controlled type 2 diabetes mellitus without complication, without long-term current use of insulin (HCC) Assessment & Plan: hgba1c acceptable, minimize simple carbs. Increase exercise  as tolerated. Continue current meds  Orders: -     Comprehensive metabolic panel -     Hemoglobin A1c -     Microalbumin / creatinine urine ratio  Primary hypertension Assessment & Plan: Well controlled, no changes to meds. Encouraged heart healthy diet such as the DASH diet and exercise as tolerated.    Orders: -     Lipid panel -     CBC with Differential/Platelet -     Comprehensive metabolic panel -     TSH  Postoperative hypothyroidism Assessment & Plan: Supplement and monitor   Pure hypercholesterolemia Assessment & Plan: Encourage heart healthy diet such as MIND or DASH diet, increase exercise, avoid trans fats, simple carbohydrates and processed foods, consider a krill or fish or flaxseed oil cap daily. Tolerating Rosuvastatin  Orders: -     Lipid panel  Obesity (BMI 30-39.9) Assessment & Plan: Encouraged DASH or MIND diet, decrease po intake and increase exercise as tolerated. Needs 7-8 hours of sleep nightly. Avoid trans fats, eat small, frequent meals every 4-5 hours with lean proteins, complex carbs and healthy fats. Minimize simple carbs, high fat foods and processed foods   Hypocalcemia -     VITAMIN D 25 Hydroxy (Vit-D Deficiency, Fractures)  Polyp of colon, unspecified part of colon, unspecified type -     Hyoscyamine Sulfate; Take 1 tablet (0.125 mg total) by mouth every 4 (four) hours as needed.  Dispense: 30 tablet; Refill: 2 -     Ambulatory referral to Gastroenterology  Gastroesophageal reflux disease, unspecified whether esophagitis present -     Hyoscyamine Sulfate; Take 1 tablet (0.125 mg total) by mouth every 4 (four) hours as needed.  Dispense: 30 tablet; Refill: 2 -     Ambulatory referral to Gastroenterology  Hiatal hernia -     Hyoscyamine Sulfate; Take 1 tablet (0.125 mg total) by mouth every 4 (four) hours as needed.  Dispense: 30 tablet; Refill: 2 -     Ambulatory referral to  Gastroenterology  Renal mass -     US RENAL; Future  Other  specified disorders of kidney and ureter -     CT ABDOMEN PELVIS WO CONTRAST; Future  Other orders -     Famotidine; Take 1 tablet (40 mg total) by mouth at bedtime.  Dispense: 90 tablet; Refill: 3 -     Omeprazole; Take 1 capsule (40 mg total) by mouth daily as needed (reflux).  Dispense: 90 capsule; Refill: 3 -     Pulmicort Flexhaler; 2 puffs once to twice daily to prevent coughing or wheezing.  Dispense: 3 each; Refill: 0    Assessment and Plan    Gastroesophageal Reflux Disease (GERD) Increased symptoms of reflux with associated cough. Currently on over-the-counter Omeprazole 20mg . -Increase Omeprazole to 40mg  daily. -Add Famotidine in the evening. -Continue Hyoscyamine as needed for spasms. -Consider alternating probiotics for gut health.  Irritable Bowel Syndrome (IBS) Recent episode of gastroenteritis, possibly related to IBS. -Continue Hyoscyamine as needed for spasms. -Consider alternating probiotics for gut health.  Hypertension Well controlled on Enalapril. -Continue Enalapril. Monitor for potential cough related to medication.  General Health Maintenance -Order blood work to check sugar and calcium levels. -Refill Pulmicort for 90 days. -Order renal ultrasound to further evaluate small lesion in right kidney. -Repeat CT abdomen/pelvis in 6 months to monitor for changes. -Follow up with Gastroenterology for further evaluation of GI symptoms and review of imaging findings. -Continue monitoring balance issues with Dr. Suszanne Conners. Consider further workup if no improvement.         Danise Edge, MD

## 2023-07-11 ENCOUNTER — Encounter: Payer: Self-pay | Admitting: Emergency Medicine

## 2023-07-11 ENCOUNTER — Other Ambulatory Visit: Payer: Self-pay | Admitting: Family Medicine

## 2023-07-11 DIAGNOSIS — N2889 Other specified disorders of kidney and ureter: Secondary | ICD-10-CM

## 2023-07-13 ENCOUNTER — Other Ambulatory Visit: Payer: Self-pay | Admitting: *Deleted

## 2023-07-13 DIAGNOSIS — I10 Essential (primary) hypertension: Secondary | ICD-10-CM

## 2023-07-13 DIAGNOSIS — E119 Type 2 diabetes mellitus without complications: Secondary | ICD-10-CM

## 2023-07-13 DIAGNOSIS — E890 Postprocedural hypothyroidism: Secondary | ICD-10-CM

## 2023-07-14 ENCOUNTER — Ambulatory Visit (INDEPENDENT_AMBULATORY_CARE_PROVIDER_SITE_OTHER): Payer: Medicare Other

## 2023-07-14 ENCOUNTER — Other Ambulatory Visit (INDEPENDENT_AMBULATORY_CARE_PROVIDER_SITE_OTHER): Payer: Medicare Other

## 2023-07-14 DIAGNOSIS — E890 Postprocedural hypothyroidism: Secondary | ICD-10-CM

## 2023-07-14 LAB — T4, FREE: Free T4: 1.32 ng/dL (ref 0.60–1.60)

## 2023-07-17 ENCOUNTER — Ambulatory Visit: Payer: Medicare Other | Admitting: Pulmonary Disease

## 2023-07-17 ENCOUNTER — Encounter: Payer: Self-pay | Admitting: Pulmonary Disease

## 2023-07-17 VITALS — BP 118/80 | HR 82 | Ht 61.0 in | Wt 199.6 lb

## 2023-07-17 DIAGNOSIS — R0602 Shortness of breath: Secondary | ICD-10-CM

## 2023-07-17 DIAGNOSIS — G4733 Obstructive sleep apnea (adult) (pediatric): Secondary | ICD-10-CM

## 2023-07-17 NOTE — Patient Instructions (Signed)
I will see you as needed  Call us with significant concerns  Continue using your Pulmicort  Use albuterol as needed

## 2023-07-17 NOTE — Progress Notes (Signed)
Monica Wallace    [id number]    Apr 17, 1951  Primary Care Physician:Blyth, Bryon Lions, MD  Referring Physician: Bradd Canary, MD [address] [place],  [place] [address]  Chief complaint:   Patient being evaluated for obstructive sleep apnea Shortness of breath  HPI: Not very concerned about sleep disordered breathing  Denies significant shortness of breath  Was treated for COVID in October  Denies any chest pains or chest discomfort She does have occasional cough  She continues to use Pulmicort about once a day Rarely needs albuterol  Was diagnosed with obstructive sleep apnea previously but could not tolerate CPAP Did have some degree of claustrophobia previously  Does not desire any further evaluation at present  She has an occasional cough Some arthritis Sciatica, weakness on the right side -MRIs have not shown a stroke -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, plantar fasciitis  Did have COVID in 2021, was not hospitalized for it She describes episodes of some weakness of her arms in the past-work-up was negative with CTs, MRI  She does have some cough, wheezing -Uses Pulmicort twice a day, albuterol only as needed -We will end up using albuterol about once a week  Rare headaches Rare night sweats Memory is good  She does have hypertension, asthma, diabetes, hypercholesterolemia, sinus and allergy problems, hypothyroidism  Never smoker  Tries to go to sleep between 8 and 9 PM Takes a few minutes to fall asleep 1-2 awakenings Final wake up time about 430  Weight is stable recently we will see her back in a year nothing to   Outpatient Encounter Medications as of 07/17/2023  Medication Sig   acetaminophen (TYLENOL) 325 MG tablet Take 650 mg by mouth every 6 (six) hours as needed.   albuterol (PROVENTIL) (2.5 MG/3ML) 0.083% nebulizer solution Take 3 mLs (2.5 mg total) by nebulization every 6 (six) hours as needed  for wheezing or shortness of breath.   albuterol (VENTOLIN HFA) 108 (90 Base) MCG/ACT inhaler Inhale 2 puffs into the lungs every 6 (six) hours as needed for wheezing or shortness of breath.   azelastine (ASTELIN) 0.1 % nasal spray 1-2 sprays per nostril twice daily for runny nose   benzonatate (TESSALON PERLES) 100 MG capsule Take 2 capsules (200 mg total) by mouth 3 (three) times daily as needed for cough.   Blood Glucose Monitoring Suppl (ONETOUCH VERIO FLEX SYSTEM) w/Device KIT Use to check blood glucose once a day.  DX code: E11.9   budesonide (PULMICORT FLEXHALER) 180 MCG/ACT inhaler 2 puffs once to twice daily to prevent coughing or wheezing.   Cholecalciferol (CVS VIT D 5000 HIGH-POTENCY PO) Take 5,000 Units by mouth daily.   cyclobenzaprine (FLEXERIL) 5 MG tablet Take 1 tablet (5 mg total) by mouth 3 (three) times daily as needed for muscle spasms.   enalapril (VASOTEC) 2.5 MG tablet Take 1 tablet (2.5 mg total) by mouth daily.   estradiol (ESTRACE VAGINAL) 0.1 MG/GM vaginal cream Place 1 Applicatorful vaginally at bedtime.   famotidine (PEPCID) 40 MG tablet Take 1 tablet (40 mg total) by mouth at bedtime.   fluticasone (FLONASE) 50 MCG/ACT nasal spray Place 2 sprays into both nostrils daily.   furosemide (LASIX) 20 MG tablet 1 tab by mouth daily as needed for edema, weight gain >3lbs in 24 hours   glucose blood (ONETOUCH VERIO) test strip USE TO TEST BLOOD GLUCOSE ONCE DAILY AS DIRECTED  hyoscyamine (LEVSIN SL) 0.125 MG SL tablet Take 1 tablet (0.125 mg total) by mouth every 4 (four) hours as needed.   Lancets (ONETOUCH DELICA PLUS LANCET33G) MISC TEST BLOOD SUGAR ONCE A DAY   loratadine (CLARITIN REDITABS) 10 MG dissolvable tablet Take 10 mg by mouth daily.   meclizine (ANTIVERT) 25 MG tablet Take 1 tablet (25 mg total) by mouth 2 (two) times daily as needed for up to 20 doses for dizziness.   metoCLOPramide (REGLAN) 10 MG tablet Take 1 tablet (10 mg total) by mouth every 6 (six) hours.    montelukast (SINGULAIR) 10 MG tablet TAKE 1 TABLET(10 MG) BY MOUTH AT BEDTIME   Olopatadine HCl 0.2 % SOLN Place 1 drop in each eye once a day as needed for itchy watery eyes   omeprazole (PRILOSEC) 40 MG capsule Take 1 capsule (40 mg total) by mouth daily as needed (reflux).   potassium chloride SA (KLOR-CON M) 20 MEQ tablet TAKE 2 TABLETS BY MOUTH DAILY FOR 3 DAYS. THEN TAKE 1 TABLET BY MOUTH DAILY. (Patient taking differently: as needed. TAKE 2 TABLETS BY MOUTH DAILY FOR 3 DAYS. THEN TAKE 1 TABLET BY MOUTH DAILY.)   rosuvastatin (CRESTOR) 5 MG tablet TAKE 1 TABLET(5 MG) BY MOUTH 2 TIMES A WEEK   SYNTHROID 137 MCG tablet Take 1 tablet (137 mcg total) by mouth as directed. 1 tablet 6 days out of the week   triamterene-hydrochlorothiazide (MAXZIDE-25) 37.5-25 MG tablet TAKE 1 TABLET BY MOUTH DAILY   [DISCONTINUED] predniSONE (DELTASONE) 10 MG tablet Take 2 tablets (20 mg total) by mouth daily. (Patient not taking: Reported on 07/06/2023)   No facility-administered encounter medications on file as of 07/17/2023.    Allergies as of 07/17/2023 - Review Complete 07/17/2023  Allergen Reaction Noted   Levaquin [levofloxacin] Shortness Of Breath 08/22/2013   Iodinated contrast media Itching 06/21/2023   Clindamycin/lincomycin Other (See Comments) 11/20/2017   Codeine Itching 08/22/2013   Gabapentin Itching 09/02/2019    Past Medical History:  Diagnosis Date   Allergy History of food and drug   Arthritis    Asthma    Baker's cyst of knee, right 02/16/2017   Cancer (HCC) 2002   thyroid (2002)   Cataract History surgery 2017   Cystic breast    Diabetes (HCC)    GERD (gastroesophageal reflux disease)    Hepatic cyst 02/02/2015   Hyperlipidemia    Hypertension    Insomnia 07/23/2016   Menopause    Migraine    Palpitations    Preventative health care 01/18/2016   Sleep apnea 02/02/2015   Thyroid disease     Past Surgical History:  Procedure Laterality Date   ABDOMINAL  HYSTERECTOMY     took right ovary and uterus   APPENDECTOMY     CARPAL TUNNEL RELEASE Right    CESAREAN SECTION     CHOLECYSTECTOMY     EYE SURGERY Bilateral 2017   cataracts   KNEE ARTHROPLASTY     SHOULDER ARTHROSCOPY W/ ROTATOR CUFF REPAIR Left    THYROIDECTOMY  2002   TUBAL LIGATION      Family History  Problem Relation Age of Onset   Arthritis Mother    Transient ischemic attack Mother    Hypertension Mother    Hyperlipidemia Mother    Diabetes Father    Heart disease Father    Arthritis Father    Kidney disease Father    Hypertension Father    Hyperlipidemia Father    Cancer Sister 44  pancreatic   Fibromyalgia Sister    Allergic rhinitis Sister    Asthma Maternal Grandfather        smoker   Depression Maternal Grandfather    Dementia Paternal Grandmother    Diabetes Paternal Grandfather    Hypertension Brother    Allergic rhinitis Brother    Scoliosis Daughter        had rods placed and removed   Arthritis Sister    Gout Sister    GER disease Sister    Cancer Sister        breast cancer   Breast cancer Sister 71    Social History   Socioeconomic History   Marital status: Divorced    Spouse name: Not on file   Number of children: 2   Years of education: Not on file   Highest education level: Master's degree (e.g., MA, MS, MEng, MEd, MSW, MBA)  Occupational History   Not on file  Tobacco Use   Smoking status: Never   Smokeless tobacco: Never  Vaping Use   Vaping status: Never Used  Substance and Sexual Activity   Alcohol use: Yes    Comment: A few times a year   Drug use: No   Sexual activity: Yes    Birth control/protection: Post-menopausal, Surgical, Other-see comments    Comment:  boyfriend  Other Topics Concern   Not on file  Social History Narrative   Household: pt, daughter and sister    Social Drivers of Corporate investment banker Strain: High Risk (07/02/2023)   Overall Financial Resource Strain (CARDIA)    Difficulty of  Paying Living Expenses: Hard  Food Insecurity: No Food Insecurity (07/02/2023)   Hunger Vital Sign    Worried About Running Out of Food in the Last Year: Never true    Ran Out of Food in the Last Year: Never true  Transportation Needs: No Transportation Needs (07/02/2023)   PRAPARE - Administrator, Civil Service (Medical): No    Lack of Transportation (Non-Medical): No  Physical Activity: Insufficiently Active (07/02/2023)   Exercise Vital Sign    Days of Exercise per Week: 2 days    Minutes of Exercise per Session: 10 min  Stress: No Stress Concern Present (07/02/2023)   [employer] of Occupational Health - Occupational Stress Questionnaire    Feeling of Stress : Only a little  Social Connections: Moderately Isolated (07/02/2023)   Social Connection and Isolation Panel [NHANES]    Frequency of Communication with Friends and Family: More than three times a week    Frequency of Social Gatherings with Friends and Family: More than three times a week    Attends Religious Services: More than 4 times per year    Active Member of [employer] or Organizations: No    Attends Banker Meetings: Never    Marital Status: Divorced  Catering manager Violence: Not At Risk (04/04/2022)   Humiliation, Afraid, Rape, and Kick questionnaire    Fear of Current or Ex-Partner: No    Emotionally Abused: No    Physically Abused: No    Sexually Abused: No    Review of Systems  Constitutional:  Positive for fatigue.  Respiratory:  Positive for cough.     Vitals:   07/17/23 1119  BP: 118/80  Pulse: 82  SpO2: 96%     Physical Exam Constitutional:      Appearance: She is obese.  HENT:     Head: Normocephalic.     Mouth/Throat:  Mouth: Mucous membranes are moist.  Cardiovascular:     Rate and Rhythm: Normal rate and regular rhythm.     Heart sounds: No murmur heard.    No friction rub.  Pulmonary:     Effort: No respiratory distress.     Breath sounds: No  stridor. No wheezing, rhonchi or rales.  Musculoskeletal:     Cervical back: No rigidity or tenderness.  Neurological:     Mental Status: She is alert.  Psychiatric:        Mood and Affect: Mood normal.        08/16/2021    3:00 PM 06/01/2016    1:00 PM  Results of the Epworth flowsheet  Sitting and reading 0 1  Watching TV 0 1  Sitting, inactive in a public place (e.g. a theatre or a meeting) 0 0  As a passenger in a car for an hour without a break 2 1  Lying down to rest in the afternoon when circumstances permit 2 3  Sitting and talking to someone 0 0  Sitting quietly after a lunch without alcohol 0 1  In a car, while stopped for a few minutes in traffic 0 0  Total score 4 7    Data Reviewed:  Previous sleep study had shown AHI of 5.2  Home sleep study 10/22/2021 shows moderate obstructive sleep apnea with an AHI of 15.1  PFT reviewed today showing no obstruction, no significant bronchodilator response, no restriction, normal diffusing capacity  Assessment:  Past history of mild obstructive sleep apnea-now diagnosed with moderate obstructive sleep apnea -Did not tolerate CPAP well -Does not want further evaluation at the present time  PFT within normal limits  Overall seems to be doing well    Plan/Recommendations: Use bronchodilators as needed  I will follow-up as needed  Encouraged to call with significant concerns  Continue following up with primary doctor    Virl Diamond MD [facility] 07/17/2023, 11:26 AM  CC: Bradd Canary, MD

## 2023-08-13 IMAGING — MR MR MRA HEAD W/O CM
1 series · 19 of 48 positions shown · IV contrast (gadavist)
Comparison: None

EXAM:
MRI HEAD WITHOUT CONTRAST

MRA HEAD WITHOUT CONTRAST
MRA NECK WITHOUT AND WITH CONTRAST
TECHNIQUE: Multiplanar, multi-echo pulse sequences of the brain and surrounding
structures were acquired without intravenous contrast. Angiographic
images of the Circle of Willis were acquired using MRA technique
without intravenous contrast. Angiographic images of the neck were
acquired using MRA technique without and with intravenous contrast.
Carotid stenosis measurements (when applicable) are obtained
utilizing NASCET criteria, using the distal internal carotid
diameter as the denominator.
CONTRAST:  9mL GADAVIST GADOBUTROL 1 MMOL/ML IV SOLN

[Series 5: 3d cow · axial · 0.5mm · 0.41mm/px · z∈[-125,-45]mm · 19 of 172 slices shown]
[im 1/172]
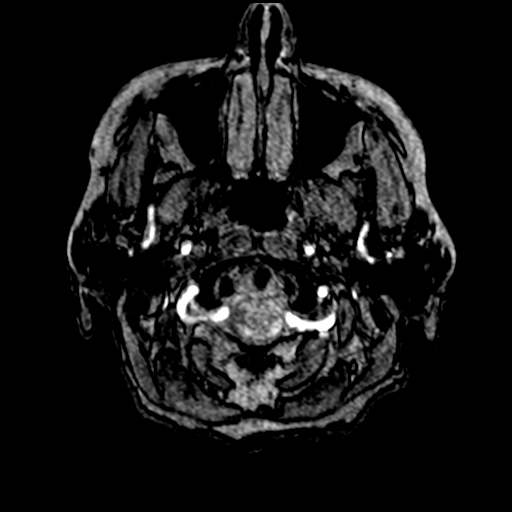
[im 4/172]
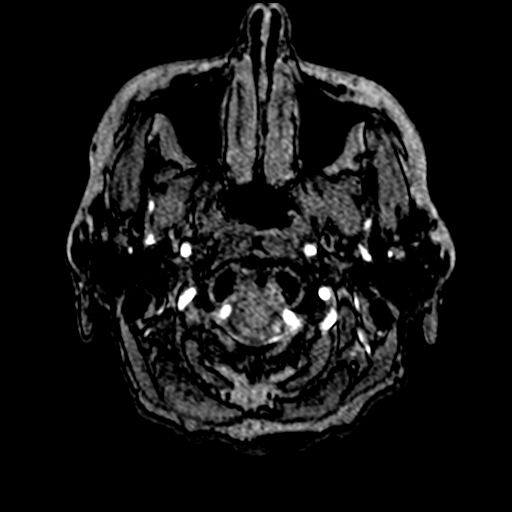
[im 8/172]
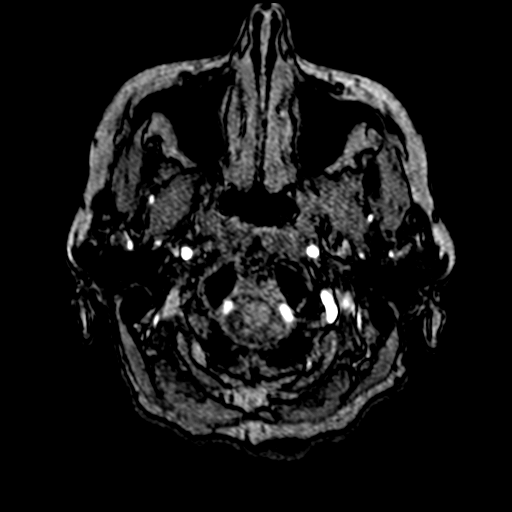
[im 11/172]
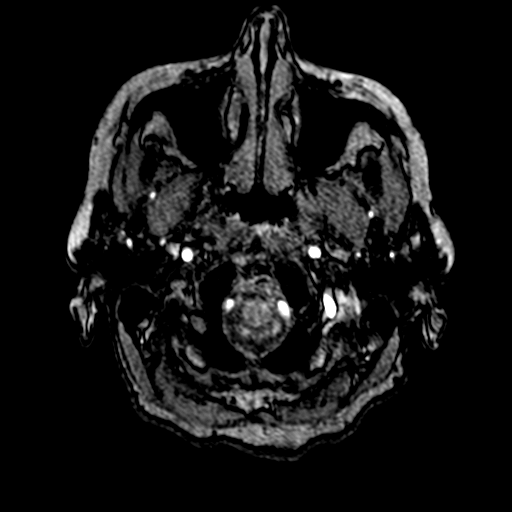
[im 15/172]
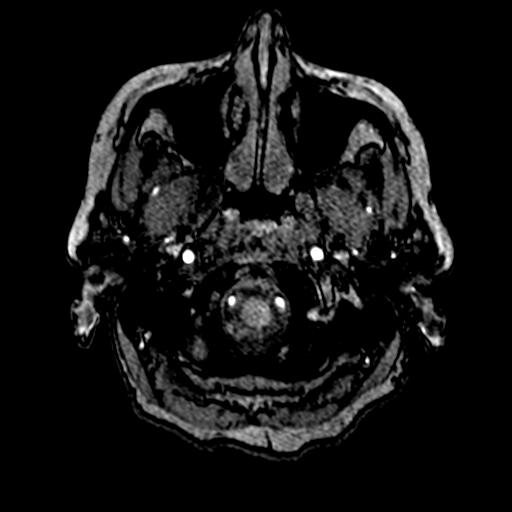
[im 19/172]
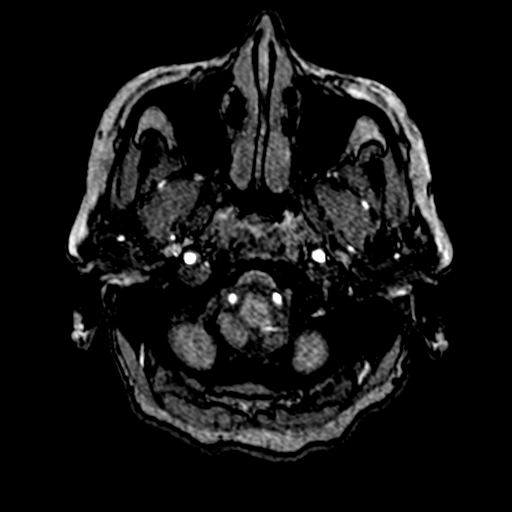
[im 22/172]
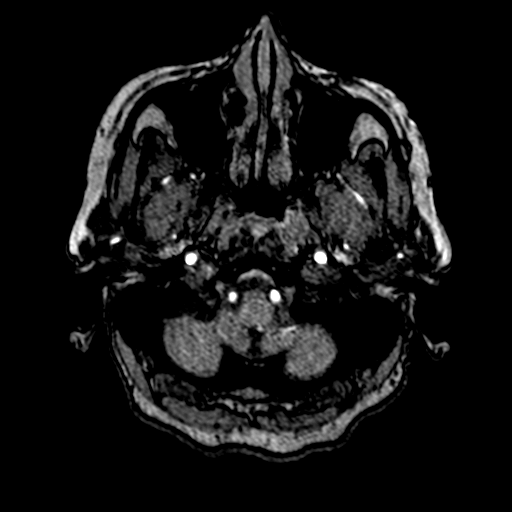
[im 26/172]
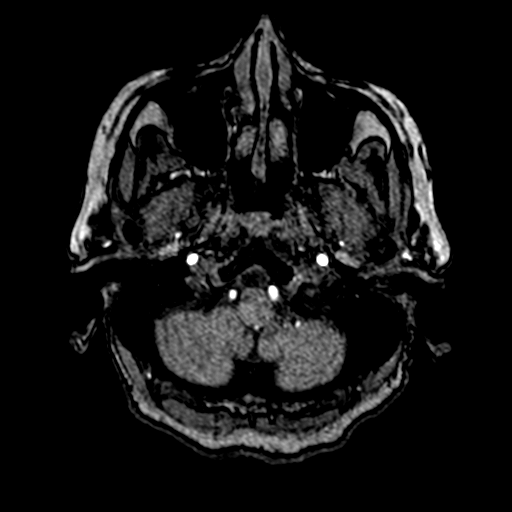
[im 30/172]
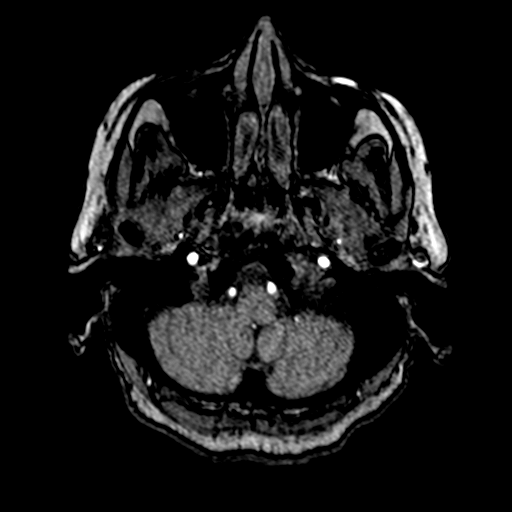
[im 33/172]
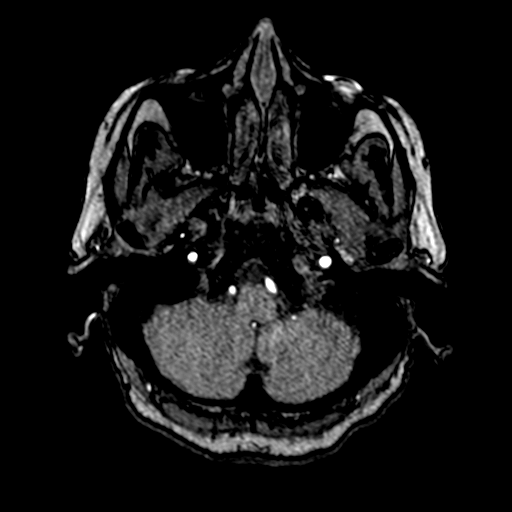
[im 37/172]
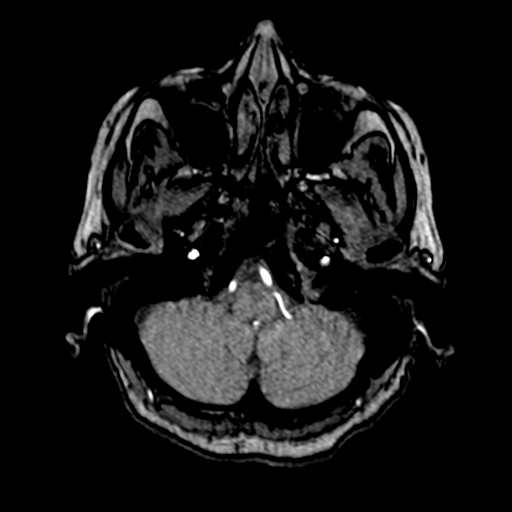
[im 55/172]
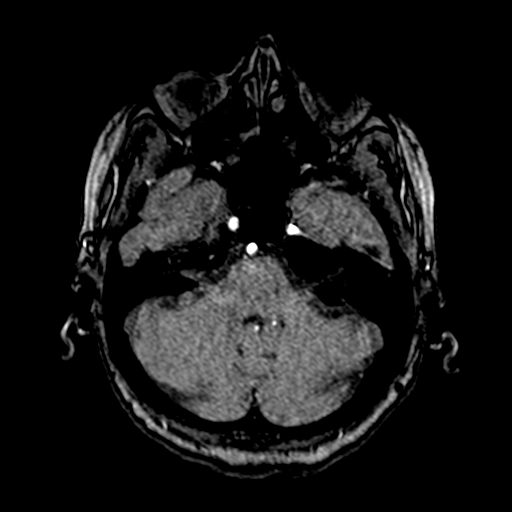
[im 77/172]
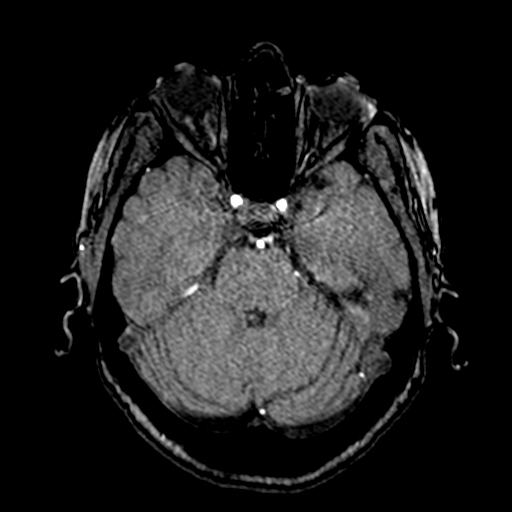
[im 88/172]
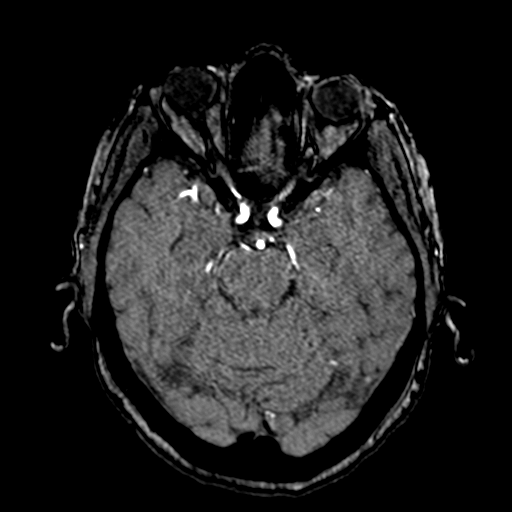
[im 99/172]
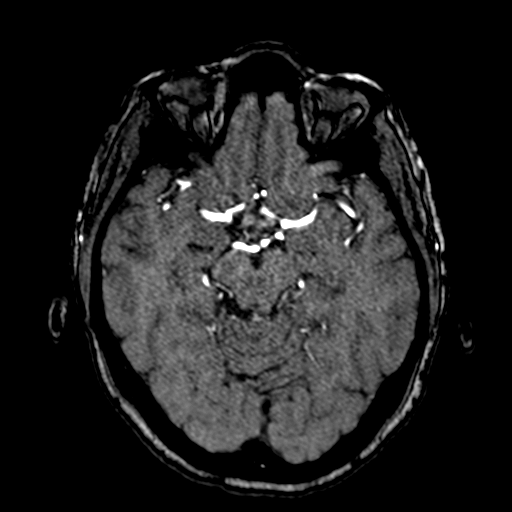
[im 121/172]
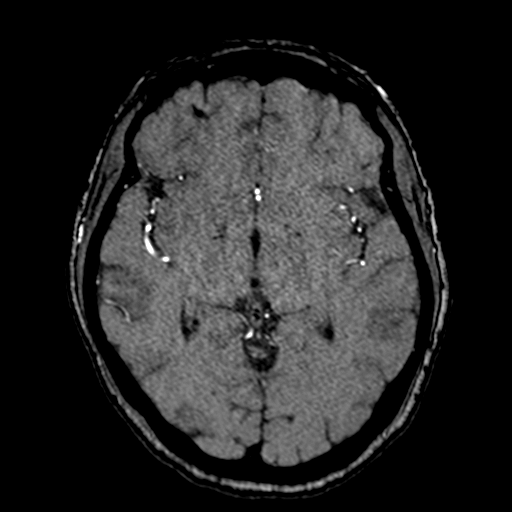
[im 142/172]
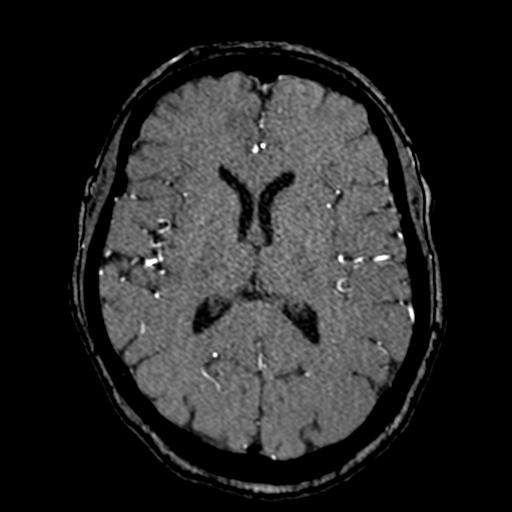
[im 146/172]
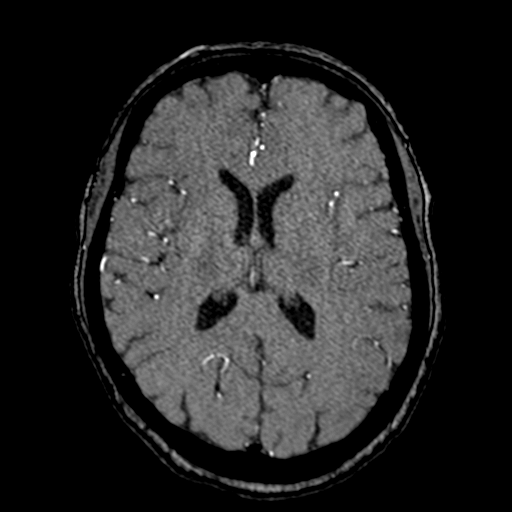
[im 164/172]
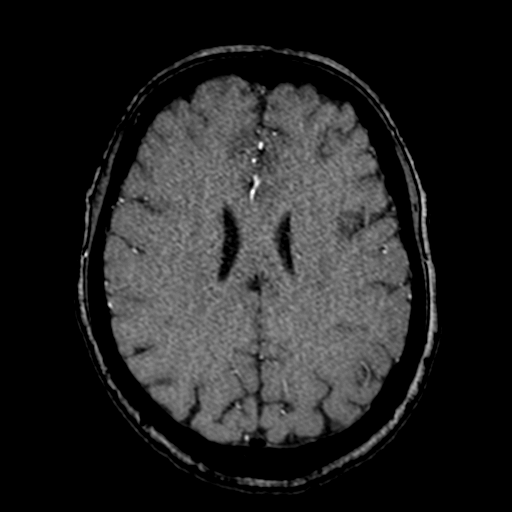

[19 of 48 positions shown; findings below may reference images not displayed]

FINDINGS: MRI HEAD FINDINGS

Brain: No acute infarct, mass effect or extra-axial collection. No
acute or chronic hemorrhage. Normal white matter signal, parenchymal
volume and CSF spaces. The midline structures are normal.

Vascular: Major flow voids are preserved.

Skull and upper cervical spine: Normal calvarium and skull base.
Visualized upper cervical spine and soft tissues are normal.

Sinuses/Orbits:No paranasal sinus fluid levels or advanced mucosal
thickening. No mastoid or middle ear effusion. Normal orbits.

MRA HEAD FINDINGS

POSTERIOR CIRCULATION:

--Vertebral arteries: Normal

--Inferior cerebellar arteries: Normal.

--Basilar artery: Normal.

--Superior cerebellar arteries: Normal.

--Posterior cerebral arteries: Normal.

ANTERIOR CIRCULATION:

--Intracranial internal carotid arteries: Normal.

--Anterior cerebral arteries (ACA): Normal.

--Middle cerebral arteries (MCA): Normal.

ANATOMIC VARIANTS: None

MRA NECK FINDINGS

Aortic arch: Normal branching pattern

Right carotid system: Normal

Left carotid system: Normal

Vertebral arteries: Codominant and normal

Other: None
IMPRESSION: 1. Normal MRI of the brain.
2. Normal MRA of the head and neck.

## 2023-08-20 ENCOUNTER — Other Ambulatory Visit: Payer: Self-pay | Admitting: Family Medicine

## 2023-08-22 ENCOUNTER — Other Ambulatory Visit: Payer: Self-pay | Admitting: Family Medicine

## 2023-08-24 ENCOUNTER — Telehealth: Payer: Self-pay | Admitting: Gastroenterology

## 2023-08-24 NOTE — Telephone Encounter (Signed)
 Patient called and stated that she was wanted to schedule an appointment with us  because her primary care had sent us  a referral. Patient has been seen by a different GI provider. Patient stated that she will call us  back to advise us  on who they where in order for us  to request records from them.

## 2023-08-26 DIAGNOSIS — N390 Urinary tract infection, site not specified: Secondary | ICD-10-CM | POA: Diagnosis not present

## 2023-08-28 ENCOUNTER — Encounter: Payer: Self-pay | Admitting: Family Medicine

## 2023-08-29 NOTE — Telephone Encounter (Signed)
Spoke with patient. Instructions given per provider's note. She was seen at [facility] in [facility]. A request for visit note has been made.

## 2023-09-01 ENCOUNTER — Telehealth (INDEPENDENT_AMBULATORY_CARE_PROVIDER_SITE_OTHER): Payer: Self-pay | Admitting: Otolaryngology

## 2023-09-01 NOTE — Telephone Encounter (Signed)
Confirmed appt & location [id number] afm

## 2023-09-04 ENCOUNTER — Ambulatory Visit (INDEPENDENT_AMBULATORY_CARE_PROVIDER_SITE_OTHER): Payer: Medicare Other

## 2023-09-04 ENCOUNTER — Encounter (INDEPENDENT_AMBULATORY_CARE_PROVIDER_SITE_OTHER): Payer: Self-pay

## 2023-09-04 VITALS — BP 157/83 | HR 73 | Ht 62.0 in | Wt 193.0 lb

## 2023-09-04 DIAGNOSIS — R42 Dizziness and giddiness: Secondary | ICD-10-CM

## 2023-09-04 DIAGNOSIS — J320 Chronic maxillary sinusitis: Secondary | ICD-10-CM

## 2023-09-04 DIAGNOSIS — J321 Chronic frontal sinusitis: Secondary | ICD-10-CM

## 2023-09-04 DIAGNOSIS — J329 Chronic sinusitis, unspecified: Secondary | ICD-10-CM | POA: Diagnosis not present

## 2023-09-04 DIAGNOSIS — J343 Hypertrophy of nasal turbinates: Secondary | ICD-10-CM

## 2023-09-04 DIAGNOSIS — R0981 Nasal congestion: Secondary | ICD-10-CM | POA: Diagnosis not present

## 2023-09-06 DIAGNOSIS — J321 Chronic frontal sinusitis: Secondary | ICD-10-CM | POA: Insufficient documentation

## 2023-09-06 DIAGNOSIS — J320 Chronic maxillary sinusitis: Secondary | ICD-10-CM | POA: Insufficient documentation

## 2023-09-06 DIAGNOSIS — J343 Hypertrophy of nasal turbinates: Secondary | ICD-10-CM | POA: Insufficient documentation

## 2023-09-06 NOTE — Progress Notes (Signed)
Patient ID: Monica Wallace, female   DOB: October 13, 1950, 73 y.o.   MRN: [id number]  Follow-up: Recurrent dizziness, chronic frontal and maxillary sinusitis  HPI: The patient is a 73 year old female who returns today for her follow-up evaluation.  The patient was previously seen for recurrent dizziness and chronic frontal and maxillary sinusitis.  The patient was treated with bilateral maxillary and frontal balloon sinuplasty in June 2022.  Her dizziness was found to be a result of her unilateral right vestibular weakness.  She was referred for vestibular rehabilitation.  The patient returns today reporting significant improvement in her dizziness.  She is currently performing her vestibular exercises at home as needed.  She had a COVID infection in October 2024.  She has noted increasing congestion after the COVID infection.  She has no known sinusitis.  Exam: General: Communicates without difficulty, well nourished, no acute distress. Head: Normocephalic, no evidence injury, no tenderness, facial buttresses intact without stepoff. Face/sinus: No tenderness to palpation and percussion. Facial movement is normal and symmetric. Eyes: PERRL, EOMI. No scleral icterus, conjunctivae clear. Neuro: CN II exam reveals vision grossly intact.  No nystagmus at any point of gaze. Ears: Auricles well formed without lesions.  Ear canals are intact without mass or lesion.  No erythema or edema is appreciated.  The TMs are intact without fluid. Nose: External evaluation reveals normal support and skin without lesions.  Dorsum is intact.  Anterior rhinoscopy reveals congested mucosa over anterior aspect of inferior turbinates and intact septum.  No purulence noted. Oral:  Oral cavity and oropharynx are intact, symmetric, without erythema or edema.  Mucosa is moist without lesions. Neck: Full range of motion without pain.  There is no significant lymphadenopathy.  No masses palpable.  Thyroid bed within normal limits to palpation.   Parotid glands and submandibular glands equal bilaterally without mass.  Trachea is midline. Neuro:  CN 2-12 grossly intact. Vestibular: No nystagmus at any point of gaze. Dix Hallpike negative. Vestibular: There is no nystagmus with pneumatic pressure on either tympanic membrane or Valsalva. The cerebellar examination is unremarkable.   Assessment: 1.  Chronic rhinitis with nasal mucosal congestion and bilateral inferior turbinate hypertrophy.  No acute infection is noted today. 2.  Chronic dizziness, secondary to her unilateral right vestibular weakness.  Her dizziness is currently under control with the vestibular exercises. 3.  History of chronic maxillary and frontal sinusitis.  Plan: 1.  The physical exam findings are reviewed with the patient. 2.  The patient is reassured and no acute sinusitis is noted today. 3.  Flonase nasal spray 2 sprays each nostril daily. 4.  Continue with her balance exercises at home daily.  The pathophysiology of dizziness and vestibular dysfunction are discussed with the patient. 5.  The patient will return for reevaluation in 6 months, sooner if needed.

## 2023-09-12 DIAGNOSIS — M25561 Pain in right knee: Secondary | ICD-10-CM | POA: Diagnosis not present

## 2023-09-25 ENCOUNTER — Encounter: Payer: Self-pay | Admitting: Physician Assistant

## 2023-09-25 ENCOUNTER — Other Ambulatory Visit (HOSPITAL_BASED_OUTPATIENT_CLINIC_OR_DEPARTMENT_OTHER): Payer: Self-pay

## 2023-09-25 ENCOUNTER — Ambulatory Visit (INDEPENDENT_AMBULATORY_CARE_PROVIDER_SITE_OTHER): Admitting: Physician Assistant

## 2023-09-25 VITALS — BP 129/81 | HR 100 | Temp 97.7°F | Ht 62.0 in | Wt 193.0 lb

## 2023-09-25 DIAGNOSIS — R051 Acute cough: Secondary | ICD-10-CM | POA: Diagnosis not present

## 2023-09-25 DIAGNOSIS — J069 Acute upper respiratory infection, unspecified: Secondary | ICD-10-CM | POA: Diagnosis not present

## 2023-09-25 DIAGNOSIS — R110 Nausea: Secondary | ICD-10-CM | POA: Diagnosis not present

## 2023-09-25 MED ORDER — BENZONATATE 100 MG PO CAPS
200.0000 mg | ORAL_CAPSULE | Freq: Three times a day (TID) | ORAL | 0 refills | Status: DC | PRN
  Filled 2023-09-25: qty 30, 5d supply, fill #0

## 2023-09-25 MED ORDER — ONDANSETRON HCL 4 MG PO TABS
4.0000 mg | ORAL_TABLET | Freq: Three times a day (TID) | ORAL | 0 refills | Status: AC | PRN
  Filled 2023-09-25: qty 20, 7d supply, fill #0

## 2023-09-25 NOTE — Progress Notes (Signed)
 Established patient visit   Patient: Monica Wallace   DOB: Feb 04, 1951   73 y.o. Female  MRN: [id number] Visit Date: 09/25/2023  Today's healthcare provider: Alfredia Ferguson, PA-C   Cc. Dry cough, sinus congestion, itchy eyes  Subjective     Pt reports her symptoms worsened Saturday, reports dry cough, no fever, runny nose, itchy eyes, nasal congestion, nausea, lack of appetite.  Cough is more severe at night. She takes singular, flonase, claritin daily and azelastine prn.  Medications: Outpatient Medications Prior to Visit  Medication Sig   acetaminophen (TYLENOL) 325 MG tablet Take 650 mg by mouth every 6 (six) hours as needed.   albuterol (PROVENTIL) (2.5 MG/3ML) 0.083% nebulizer solution Take 3 mLs (2.5 mg total) by nebulization every 6 (six) hours as needed for wheezing or shortness of breath.   albuterol (VENTOLIN HFA) 108 (90 Base) MCG/ACT inhaler Inhale 2 puffs into the lungs every 6 (six) hours as needed for wheezing or shortness of breath.   azelastine (ASTELIN) 0.1 % nasal spray 1-2 sprays per nostril twice daily for runny nose   Blood Glucose Monitoring Suppl (ONETOUCH VERIO FLEX SYSTEM) w/Device KIT Use to check blood glucose once a day.  DX code: E11.9   budesonide (PULMICORT FLEXHALER) 180 MCG/ACT inhaler 2 puffs once to twice daily to prevent coughing or wheezing.   Cholecalciferol (CVS VIT D 5000 HIGH-POTENCY PO) Take 5,000 Units by mouth daily.   cyclobenzaprine (FLEXERIL) 5 MG tablet Take 1 tablet (5 mg total) by mouth 3 (three) times daily as needed for muscle spasms.   enalapril (VASOTEC) 2.5 MG tablet Take 1 tablet (2.5 mg total) by mouth daily.   estradiol (ESTRACE VAGINAL) 0.1 MG/GM vaginal cream Place 1 Applicatorful vaginally at bedtime.   famotidine (PEPCID) 40 MG tablet Take 1 tablet (40 mg total) by mouth at bedtime.   fluticasone (FLONASE) 50 MCG/ACT nasal spray Place 2 sprays into both nostrils daily.   furosemide (LASIX) 20 MG tablet 1 tab by mouth  daily as needed for edema, weight gain >3lbs in 24 hours   glucose blood (ONETOUCH VERIO) test strip USE TO TEST BLOOD GLUCOSE ONCE DAILY AS DIRECTED   hyoscyamine (LEVSIN SL) 0.125 MG SL tablet Take 1 tablet (0.125 mg total) by mouth every 4 (four) hours as needed.   Lancets (ONETOUCH DELICA PLUS LANCET33G) MISC TEST BLOOD SUGAR ONCE A DAY   loratadine (CLARITIN REDITABS) 10 MG dissolvable tablet Take 10 mg by mouth daily.   meclizine (ANTIVERT) 25 MG tablet Take 1 tablet (25 mg total) by mouth 2 (two) times daily as needed for up to 20 doses for dizziness.   metoCLOPramide (REGLAN) 10 MG tablet Take 1 tablet (10 mg total) by mouth every 6 (six) hours.   montelukast (SINGULAIR) 10 MG tablet TAKE 1 TABLET(10 MG) BY MOUTH AT BEDTIME   Olopatadine HCl 0.2 % SOLN Place 1 drop in each eye once a day as needed for itchy watery eyes   omeprazole (PRILOSEC) 40 MG capsule Take 1 capsule (40 mg total) by mouth daily as needed (reflux).   potassium chloride SA (KLOR-CON M) 20 MEQ tablet TAKE 2 TABLETS BY MOUTH DAILY FOR 3 DAYS. THEN TAKE 1 TABLET BY MOUTH DAILY. (Patient taking differently: as needed. TAKE 2 TABLETS BY MOUTH DAILY FOR 3 DAYS. THEN TAKE 1 TABLET BY MOUTH DAILY.)   rosuvastatin (CRESTOR) 5 MG tablet TAKE 1 TABLET(5 MG) BY MOUTH 2 TIMES A WEEK   SYNTHROID 137 MCG tablet Take  1 tablet (137 mcg total) by mouth as directed. 1 tablet 6 days out of the week   triamterene-hydrochlorothiazide (MAXZIDE-25) 37.5-25 MG tablet Take 1 tablet by mouth daily.   [DISCONTINUED] benzonatate (TESSALON PERLES) 100 MG capsule Take 2 capsules (200 mg total) by mouth 3 (three) times daily as needed for cough.   No facility-administered medications prior to visit.    Review of Systems  Constitutional:  Positive for fatigue. Negative for fever.  HENT:  Positive for congestion, sinus pressure and sore throat.   Respiratory:  Positive for cough. Negative for shortness of breath.   Cardiovascular:  Negative for  chest pain and leg swelling.  Gastrointestinal:  Negative for abdominal pain.  Neurological:  Positive for headaches. Negative for dizziness.       Objective    BP 129/81   Pulse 100   Temp 97.7 F (36.5 C)   Ht 5\' 2"  (1.575 m)   Wt 193 lb (87.5 kg)   SpO2 99%   BMI 35.30 kg/m    Physical Exam Constitutional:      General: She is awake.     Appearance: She is well-developed.  HENT:     Head: Normocephalic.     Right Ear: Tympanic membrane normal.     Left Ear: Tympanic membrane normal.  Eyes:     Conjunctiva/sclera: Conjunctivae normal.  Cardiovascular:     Rate and Rhythm: Normal rate and regular rhythm.     Heart sounds: Normal heart sounds.  Pulmonary:     Effort: Pulmonary effort is normal.     Breath sounds: Normal breath sounds. No wheezing, rhonchi or rales.  Skin:    General: Skin is warm.  Neurological:     Mental Status: She is alert and oriented to person, place, and time.  Psychiatric:        Attention and Perception: Attention normal.        Mood and Affect: Mood normal.        Speech: Speech normal.        Behavior: Behavior is cooperative.     No results found for any visits on 09/25/23.  Assessment & Plan    Upper respiratory tract infection, unspecified type -     Benzonatate; Take 2 capsules (200 mg total) by mouth 3 (three) times daily as needed for cough.  Dispense: 30 capsule; Refill: 0  Acute cough -     Benzonatate; Take 2 capsules (200 mg total) by mouth 3 (three) times daily as needed for cough.  Dispense: 30 capsule; Refill: 0  Nausea -     Ondansetron HCl; Take 1 tablet (4 mg total) by mouth every 8 (eight) hours as needed for nausea or vomiting.  Dispense: 20 tablet; Refill: 0   Recommending rest, hydration, cont rx and otc meds for allergies. Rx tessalon for cough, zofran for nausea.   Return if symptoms worsen or fail to improve.       Alfredia Ferguson, PA-C  [facility] [phone] (phone) [phone] (fax)  [facility]

## 2023-09-26 ENCOUNTER — Other Ambulatory Visit (HOSPITAL_BASED_OUTPATIENT_CLINIC_OR_DEPARTMENT_OTHER): Payer: Self-pay

## 2023-10-05 DIAGNOSIS — Z6836 Body mass index (BMI) 36.0-36.9, adult: Secondary | ICD-10-CM | POA: Diagnosis not present

## 2023-10-05 DIAGNOSIS — M79641 Pain in right hand: Secondary | ICD-10-CM | POA: Diagnosis not present

## 2023-10-05 DIAGNOSIS — E669 Obesity, unspecified: Secondary | ICD-10-CM | POA: Diagnosis not present

## 2023-10-05 DIAGNOSIS — M79642 Pain in left hand: Secondary | ICD-10-CM | POA: Diagnosis not present

## 2023-10-05 DIAGNOSIS — M154 Erosive (osteo)arthritis: Secondary | ICD-10-CM | POA: Diagnosis not present

## 2023-10-15 ENCOUNTER — Ambulatory Visit (HOSPITAL_BASED_OUTPATIENT_CLINIC_OR_DEPARTMENT_OTHER)
Admission: RE | Admit: 2023-10-15 | Discharge: 2023-10-15 | Disposition: A | Discharge status: Home / Self Care | Payer: Medicare Other | Source: Ambulatory Visit | Attending: Family Medicine | Admitting: Family Medicine

## 2023-10-15 ENCOUNTER — Encounter (HOSPITAL_BASED_OUTPATIENT_CLINIC_OR_DEPARTMENT_OTHER): Payer: Self-pay

## 2023-10-15 DIAGNOSIS — N2889 Other specified disorders of kidney and ureter: Secondary | ICD-10-CM

## 2023-10-22 ENCOUNTER — Encounter: Payer: Self-pay | Admitting: Family Medicine

## 2023-10-31 NOTE — Assessment & Plan Note (Signed)
 Supplement and monitor

## 2023-10-31 NOTE — Assessment & Plan Note (Signed)
 [id number] acceptable, minimize simple carbs. Increase exercise as tolerated. Continue current meds

## 2023-10-31 NOTE — Assessment & Plan Note (Signed)
 Well controlled, no changes to meds. Encouraged heart healthy diet such as the DASH diet and exercise as tolerated.

## 2023-10-31 NOTE — Assessment & Plan Note (Signed)
 Encourage heart healthy diet such as MIND or DASH diet, increase exercise, avoid trans fats, simple carbohydrates and processed foods, consider a krill or fish or flaxseed oil cap daily. Tolerating Rosuvastatin

## 2023-11-02 ENCOUNTER — Encounter: Payer: Self-pay | Admitting: Family Medicine

## 2023-11-02 ENCOUNTER — Ambulatory Visit (INDEPENDENT_AMBULATORY_CARE_PROVIDER_SITE_OTHER): Payer: Medicare Other | Admitting: Family Medicine

## 2023-11-02 VITALS — BP 138/80 | HR 77 | Temp 97.9°F | Resp 18 | Ht 62.0 in | Wt 196.6 lb

## 2023-11-02 DIAGNOSIS — I10 Essential (primary) hypertension: Secondary | ICD-10-CM

## 2023-11-02 DIAGNOSIS — E559 Vitamin D deficiency, unspecified: Secondary | ICD-10-CM | POA: Diagnosis not present

## 2023-11-02 DIAGNOSIS — E119 Type 2 diabetes mellitus without complications: Secondary | ICD-10-CM

## 2023-11-02 DIAGNOSIS — E7800 Pure hypercholesterolemia, unspecified: Secondary | ICD-10-CM | POA: Diagnosis not present

## 2023-11-02 DIAGNOSIS — E890 Postprocedural hypothyroidism: Secondary | ICD-10-CM | POA: Diagnosis not present

## 2023-11-02 LAB — CBC WITH DIFFERENTIAL/PLATELET
Basophils Absolute: 0.1 10*3/uL (ref 0.0–0.1)
Basophils Relative: 0.7 % (ref 0.0–3.0)
Eosinophils Absolute: 0.2 10*3/uL (ref 0.0–0.7)
Eosinophils Relative: 2 % (ref 0.0–5.0)
HCT: 41.2 % (ref 36.0–46.0)
Hemoglobin: 13.5 g/dL (ref 12.0–15.0)
Lymphocytes Relative: 35.5 % (ref 12.0–46.0)
Lymphs Abs: 3 10*3/uL (ref 0.7–4.0)
MCHC: 32.7 g/dL (ref 30.0–36.0)
MCV: 93 fl (ref 78.0–100.0)
Monocytes Absolute: 0.7 10*3/uL (ref 0.1–1.0)
Monocytes Relative: 7.8 % (ref 3.0–12.0)
Neutro Abs: 4.6 10*3/uL (ref 1.4–7.7)
Neutrophils Relative %: 54 % (ref 43.0–77.0)
Platelets: 325 10*3/uL (ref 150.0–400.0)
RBC: 4.43 Mil/uL (ref 3.87–5.11)
RDW: 13.4 % (ref 11.5–15.5)
WBC: 8.6 10*3/uL (ref 4.0–10.5)

## 2023-11-02 LAB — COMPREHENSIVE METABOLIC PANEL WITH GFR
ALT: 17 U/L (ref 0–35)
AST: 21 U/L (ref 0–37)
Albumin: 4.5 g/dL (ref 3.5–5.2)
Alkaline Phosphatase: 76 U/L (ref 39–117)
BUN: 18 mg/dL (ref 6–23)
CO2: 33 meq/L — ABNORMAL HIGH (ref 19–32)
Calcium: 9.9 mg/dL (ref 8.4–10.5)
Chloride: 98 meq/L (ref 96–112)
Creatinine, Ser: 0.88 mg/dL (ref 0.40–1.20)
GFR: 64.61 mL/min (ref >60.00)
Glucose, Bld: 96 mg/dL (ref 70–99)
Potassium: 3.8 meq/L (ref 3.5–5.1)
Sodium: 140 meq/L (ref 135–145)
Total Bilirubin: 0.4 mg/dL (ref 0.2–1.2)
Total Protein: 7.1 g/dL (ref 6.0–8.3)

## 2023-11-02 LAB — LIPID PANEL
Cholesterol: 202 mg/dL — ABNORMAL HIGH (ref 0–200)
HDL: 61.7 mg/dL (ref >39.00)
LDL Cholesterol: 116 mg/dL — ABNORMAL HIGH (ref 0–99)
NonHDL: 140.34
Total CHOL/HDL Ratio: 3
Triglycerides: 123 mg/dL (ref 0.0–149.0)
VLDL: 24.6 mg/dL (ref 0.0–40.0)

## 2023-11-02 LAB — VITAMIN D 25 HYDROXY (VIT D DEFICIENCY, FRACTURES): VITD: 52.41 ng/mL (ref 30.00–100.00)

## 2023-11-02 LAB — HEMOGLOBIN A1C: Hgb A1c MFr Bld: 6.2 % (ref 4.6–6.5)

## 2023-11-02 LAB — TSH: TSH: 0.39 u[IU]/mL (ref 0.35–5.50)

## 2023-11-02 MED ORDER — PREDNISONE 50 MG PO TABS: ORAL_TABLET | ORAL | 0 refills | Status: DC

## 2023-11-02 MED ORDER — DIPHENHYDRAMINE HCL 25 MG PO CAPS: 50.0000 mg | ORAL_CAPSULE | Freq: Once | ORAL | Status: DC

## 2023-11-02 NOTE — Patient Instructions (Signed)

## 2023-11-02 NOTE — Progress Notes (Signed)
 Subjective:    Patient ID: Monica Wallace, female    DOB: Nov 14, 1950, 73 y.o.   MRN: [id number]  Chief Complaint  Patient presents with  . Follow-up    4 month    HPI Discussed the use of AI scribe software for clinical note transcription with the patient, who gave verbal consent to proceed.  History of Present Illness Monica Wallace Tays is a 73 year old female who presents with fatigue and sinus pressure.  She experiences significant fatigue, which she attributes to stress and possibly her thyroid. Her mother's recent hospitalizations for heart failure and acute kidney failure have contributed to her stress. She is currently taking prednisone 50 mg at specific intervals before allergy exposure and uses Benadryl as needed.  She experiences sinus pressure and unsteadiness at least twice a week, sometimes lasting all day. She suspects sinus pressure and takes decongestants, which sometimes help. She always has sinus issues, though they have been better this year. No nausea, vomiting, or migraine-like symptoms, but she describes a persistent pressure.  She has a history of IBS with constipation, experiencing small, hard bowel movements almost daily. She uses Benefiber and plans to use Miralax. She experiences spasms in the lower colon and uses hyoscyamine sublingually, which helps alleviate the spasms.  She has a history of shingles and expresses concern about recurrence, having had shingles twice in the past.    Past Medical History:  Diagnosis Date  . Allergy History of food and drug  . Arthritis   . Asthma   . Baker's cyst of knee, right 02/16/2017  . Cancer (HCC) 2002   thyroid (2002)  . Cataract History surgery 2017  . Cystic breast   . Diabetes (HCC)   . GERD (gastroesophageal reflux disease)   . Hepatic cyst 02/02/2015  . Hyperlipidemia   . Hypertension   . Insomnia 07/23/2016  . Menopause   . Migraine   . Palpitations   . Preventative health care 01/18/2016  . Sleep  apnea 02/02/2015  . Thyroid disease     Past Surgical History:  Procedure Laterality Date  . ABDOMINAL HYSTERECTOMY     took right ovary and uterus  . APPENDECTOMY    . CARPAL TUNNEL RELEASE Right   . CESAREAN SECTION    . CHOLECYSTECTOMY    . EYE SURGERY Bilateral 2017   cataracts  . KNEE ARTHROPLASTY    . SHOULDER ARTHROSCOPY W/ ROTATOR CUFF REPAIR Left   . THYROIDECTOMY  2002  . TUBAL LIGATION      Family History  Problem Relation Age of Onset  . Arthritis Mother   . Transient ischemic attack Mother   . Hypertension Mother   . Hyperlipidemia Mother   . Diabetes Father   . Heart disease Father   . Arthritis Father   . Kidney disease Father   . Hypertension Father   . Hyperlipidemia Father   . Cancer Sister 30       pancreatic  . Fibromyalgia Sister   . Allergic rhinitis Sister   . Asthma Maternal Grandfather        smoker  . Depression Maternal Grandfather   . Dementia Paternal Grandmother   . Diabetes Paternal Grandfather   . Hypertension Brother   . Allergic rhinitis Brother   . Scoliosis Daughter        had rods placed and removed  . Arthritis Sister   . Gout Sister   . GER disease Sister   . Cancer Sister  breast cancer  . Breast cancer Sister 92    Social History   Socioeconomic History  . Marital status: Divorced    Spouse name: Not on file  . Number of children: 2  . Years of education: Not on file  . Highest education level: Master's degree (e.g., MA, MS, MEng, MEd, MSW, MBA)  Occupational History  . Not on file  Tobacco Use  . Smoking status: Never  . Smokeless tobacco: Never  Vaping Use  . Vaping status: Never Used  Substance and Sexual Activity  . Alcohol use: Yes    Comment: A few times a year  . Drug use: No  . Sexual activity: Yes    Birth control/protection: Post-menopausal, Surgical, Other-see comments    Comment:  boyfriend  Other Topics Concern  . Not on file  Social History Narrative   Household: pt, daughter and  sister    Social Drivers of Corporate investment banker Strain: High Risk (07/02/2023)   Overall Financial Resource Strain (CARDIA)   . Difficulty of Paying Living Expenses: Hard  Food Insecurity: No Food Insecurity (07/02/2023)   Hunger Vital Sign   . Worried About Programme researcher, broadcasting/film/video in the Last Year: Never true   . Ran Out of Food in the Last Year: Never true  Transportation Needs: No Transportation Needs (07/02/2023)   PRAPARE - Transportation   . Lack of Transportation (Medical): No   . Lack of Transportation (Non-Medical): No  Physical Activity: Insufficiently Active (07/02/2023)   Exercise Vital Sign   . Days of Exercise per Week: 2 days   . Minutes of Exercise per Session: 10 min  Stress: No Stress Concern Present (07/02/2023)   [employer] of Occupational Health - Occupational Stress Questionnaire   . Feeling of Stress : Only a little  Social Connections: Moderately Isolated (07/02/2023)   Social Connection and Isolation Panel [NHANES]   . Frequency of Communication with Friends and Family: More than three times a week   . Frequency of Social Gatherings with Friends and Family: More than three times a week   . Attends Religious Services: More than 4 times per year   . Active Member of Clubs or Organizations: No   . Attends Banker Meetings: Never   . Marital Status: Divorced  Catering manager Violence: Not At Risk (04/04/2022)   Humiliation, Afraid, Rape, and Kick questionnaire   . Fear of Current or Ex-Partner: No   . Emotionally Abused: No   . Physically Abused: No   . Sexually Abused: No    Outpatient Medications Prior to Visit  Medication Sig Dispense Refill  . acetaminophen (TYLENOL) 325 MG tablet Take 650 mg by mouth every 6 (six) hours as needed.    Marland Kitchen albuterol (PROVENTIL) (2.5 MG/3ML) 0.083% nebulizer solution Take 3 mLs (2.5 mg total) by nebulization every 6 (six) hours as needed for wheezing or shortness of breath. 75 mL 0  . albuterol  (VENTOLIN HFA) 108 (90 Base) MCG/ACT inhaler Inhale 2 puffs into the lungs every 6 (six) hours as needed for wheezing or shortness of breath. 18 g 1  . azelastine (ASTELIN) 0.1 % nasal spray 1-2 sprays per nostril twice daily for runny nose 30 mL 5  . benzonatate (TESSALON PERLES) 100 MG capsule Take 2 capsules (200 mg total) by mouth 3 (three) times daily as needed for cough. 30 capsule 0  . Blood Glucose Monitoring Suppl (ONETOUCH VERIO FLEX SYSTEM) w/Device KIT Use to check blood glucose once  a day.  DX code: E11.9 1 kit 0  . budesonide (PULMICORT FLEXHALER) 180 MCG/ACT inhaler 2 puffs once to twice daily to prevent coughing or wheezing. 3 each 0  . Cholecalciferol ([employer] VIT D 5000 HIGH-POTENCY PO) Take 5,000 Units by mouth daily.    . cyclobenzaprine (FLEXERIL) 5 MG tablet Take 1 tablet (5 mg total) by mouth 3 (three) times daily as needed for muscle spasms. 30 tablet 1  . enalapril (VASOTEC) 2.5 MG tablet Take 1 tablet (2.5 mg total) by mouth daily. 90 tablet 1  . estradiol (ESTRACE VAGINAL) 0.1 MG/GM vaginal cream Place 1 Applicatorful vaginally at bedtime. 42.5 g 12  . famotidine (PEPCID) 40 MG tablet Take 1 tablet (40 mg total) by mouth at bedtime. 90 tablet 3  . fluticasone (FLONASE) 50 MCG/ACT nasal spray Place 2 sprays into both nostrils daily. 16 g 5  . furosemide (LASIX) 20 MG tablet 1 tab by mouth daily as needed for edema, weight gain >3lbs in 24 hours 30 tablet 3  . glucose blood (ONETOUCH VERIO) test strip USE TO TEST BLOOD GLUCOSE ONCE DAILY AS DIRECTED 100 strip 1  . hyoscyamine (LEVSIN SL) 0.125 MG SL tablet Take 1 tablet (0.125 mg total) by mouth every 4 (four) hours as needed. 30 tablet 2  . Lancets (ONETOUCH DELICA PLUS LANCET33G) MISC TEST BLOOD SUGAR ONCE A DAY 100 each 1  . loratadine (CLARITIN REDITABS) 10 MG dissolvable tablet Take 10 mg by mouth daily.    . meclizine (ANTIVERT) 25 MG tablet Take 1 tablet (25 mg total) by mouth 2 (two) times daily as needed for up to 20  doses for dizziness. 20 tablet 0  . metoCLOPramide (REGLAN) 10 MG tablet Take 1 tablet (10 mg total) by mouth every 6 (six) hours. 30 tablet 0  . montelukast (SINGULAIR) 10 MG tablet TAKE 1 TABLET(10 MG) BY MOUTH AT BEDTIME 90 tablet 1  . Olopatadine HCl 0.2 % SOLN Place 1 drop in each eye once a day as needed for itchy watery eyes 2.5 mL 5  . omeprazole (PRILOSEC) 40 MG capsule Take 1 capsule (40 mg total) by mouth daily as needed (reflux). 90 capsule 3  . ondansetron (ZOFRAN) 4 MG tablet Take 1 tablet (4 mg total) by mouth every 8 (eight) hours as needed for nausea or vomiting. 20 tablet 0  . potassium chloride SA (KLOR-CON M) 20 MEQ tablet TAKE 2 TABLETS BY MOUTH DAILY FOR 3 DAYS. THEN TAKE 1 TABLET BY MOUTH DAILY. (Patient taking differently: as needed. TAKE 2 TABLETS BY MOUTH DAILY FOR 3 DAYS. THEN TAKE 1 TABLET BY MOUTH DAILY.) 30 tablet 3  . rosuvastatin (CRESTOR) 5 MG tablet TAKE 1 TABLET(5 MG) BY MOUTH 2 TIMES A WEEK 25 tablet 1  . SYNTHROID 137 MCG tablet Take 1 tablet (137 mcg total) by mouth as directed. 1 tablet 6 days out of the week 78 tablet 3  . triamterene-hydrochlorothiazide (MAXZIDE-25) 37.5-25 MG tablet Take 1 tablet by mouth daily. 90 tablet 1   No facility-administered medications prior to visit.    Allergies  Allergen Reactions  . Levaquin [Levofloxacin] Shortness Of Breath  . Iodinated Contrast Media Itching  . Clindamycin/Lincomycin Other (See Comments)    Pruritus No rash  . Codeine Itching  . Gabapentin Itching    Review of Systems  Constitutional:  Positive for malaise/fatigue. Negative for fever.  HENT:  Positive for congestion.   Eyes:  Negative for blurred vision.  Respiratory:  Negative for shortness of breath.  Cardiovascular:  Negative for chest pain, palpitations and leg swelling.  Gastrointestinal:  Positive for constipation. Negative for abdominal pain, blood in stool and nausea.  Genitourinary:  Negative for dysuria and frequency.   Musculoskeletal:  Negative for falls.  Skin:  Negative for rash.  Neurological:  Positive for headaches. Negative for dizziness and loss of consciousness.  Endo/Heme/Allergies:  Negative for environmental allergies.  Psychiatric/Behavioral:  Negative for depression. The patient is not nervous/anxious.        Objective:    Physical Exam Constitutional:      General: She is not in acute distress.    Appearance: Normal appearance. She is well-developed. She is not toxic-appearing.  HENT:     Head: Normocephalic and atraumatic.     Right Ear: External ear normal.     Left Ear: External ear normal.     Nose: Nose normal.  Eyes:     General:        Right eye: No discharge.        Left eye: No discharge.     Conjunctiva/sclera: Conjunctivae normal.  Neck:     Thyroid: No thyromegaly.  Cardiovascular:     Rate and Rhythm: Normal rate and regular rhythm.     Heart sounds: Normal heart sounds. No murmur heard. Pulmonary:     Effort: Pulmonary effort is normal. No respiratory distress.     Breath sounds: Normal breath sounds.  Abdominal:     General: Bowel sounds are normal.     Palpations: Abdomen is soft.     Tenderness: There is no abdominal tenderness. There is no guarding.  Musculoskeletal:        General: Normal range of motion.     Cervical back: Neck supple.  Lymphadenopathy:     Cervical: No cervical adenopathy.  Skin:    General: Skin is warm and dry.  Neurological:     Mental Status: She is alert and oriented to person, place, and time.  Psychiatric:        Mood and Affect: Mood normal.        Behavior: Behavior normal.        Thought Content: Thought content normal.        Judgment: Judgment normal.    BP 138/80 (BP Location: Left Arm, Patient Position: Sitting, Cuff Size: Normal)   Pulse 77   Temp 97.9 F (36.6 C) (Oral)   Resp 18   Ht 5\' 2"  (1.575 m)   Wt 196 lb 9.6 oz (89.2 kg)   SpO2 100%   BMI 35.96 kg/m  Wt Readings from Last 3 Encounters:   11/02/23 196 lb 9.6 oz (89.2 kg)  09/25/23 193 lb (87.5 kg)  09/04/23 193 lb (87.5 kg)    Diabetic Foot Exam - Simple   No data filed    Lab Results  Component Value Date   WBC 7.5 07/06/2023   HGB 12.9 07/06/2023   HCT 40.0 07/06/2023   PLT 313.0 07/06/2023   GLUCOSE 93 07/06/2023   CHOL 164 07/06/2023   TRIG 97.0 07/06/2023   HDL 56.60 07/06/2023   LDLDIRECT 105.0 12/06/2022   LDLCALC 88 07/06/2023   ALT 15 07/06/2023   AST 19 07/06/2023   NA 140 07/06/2023   K 3.5 07/06/2023   CL 100 07/06/2023   CREATININE 0.79 07/06/2023   BUN 11 07/06/2023   CO2 30 07/06/2023   TSH 0.29 (L) 07/06/2023   INR 1.0 06/18/2021   HGBA1C 6.8 (H) 07/06/2023   MICROALBUR  1.1 07/06/2023    Lab Results  Component Value Date   TSH 0.29 (L) 07/06/2023   Lab Results  Component Value Date   WBC 7.5 07/06/2023   HGB 12.9 07/06/2023   HCT 40.0 07/06/2023   MCV 93.6 07/06/2023   PLT 313.0 07/06/2023   Lab Results  Component Value Date   NA 140 07/06/2023   K 3.5 07/06/2023   CO2 30 07/06/2023   GLUCOSE 93 07/06/2023   BUN 11 07/06/2023   CREATININE 0.79 07/06/2023   BILITOT 0.3 07/06/2023   ALKPHOS 78 07/06/2023   AST 19 07/06/2023   ALT 15 07/06/2023   PROT 6.8 07/06/2023   ALBUMIN 4.1 07/06/2023   CALCIUM 9.4 07/06/2023   ANIONGAP 12 06/21/2023   EGFR 81 06/17/2021   GFR 73.71 07/06/2023   Lab Results  Component Value Date   CHOL 164 07/06/2023   Lab Results  Component Value Date   HDL 56.60 07/06/2023   Lab Results  Component Value Date   LDLCALC 88 07/06/2023   Lab Results  Component Value Date   TRIG 97.0 07/06/2023   Lab Results  Component Value Date   CHOLHDL 3 07/06/2023   Lab Results  Component Value Date   HGBA1C 6.8 (H) 07/06/2023       Assessment & Plan:  Controlled type 2 diabetes mellitus without complication, without long-term current use of insulin (HCC) Assessment & Plan: hgba1c acceptable, minimize simple carbs. Increase exercise  as tolerated. Continue current meds  Orders: -     Hemoglobin A1c  Primary hypertension Assessment & Plan: Well controlled, no changes to meds. Encouraged heart healthy diet such as the DASH diet and exercise as tolerated.    Orders: -     Comprehensive metabolic panel with GFR -     CBC with Differential/Platelet -     TSH  Pure hypercholesterolemia Assessment & Plan: Encourage heart healthy diet such as MIND or DASH diet, increase exercise, avoid trans fats, simple carbohydrates and processed foods, consider a krill or fish or flaxseed oil cap daily. Tolerating Rosuvastatin  Orders: -     Lipid panel  Postoperative hypothyroidism Assessment & Plan: Supplement and monitor   Vitamin D deficiency -     VITAMIN D 25 Hydroxy (Vit-D Deficiency, Fractures)  Other orders -     predniSONE; 1 tab po 13 hours prior to imaging then 1 tab po 7 hours prior to imaging and 1 tab po 1 hour prior to imaging  Dispense: 3 tablet; Refill: 0 -     diphenhydrAMINE HCl; Take 2 capsules (50 mg total) by mouth once for 1 dose.    Assessment and Plan Assessment & Plan Fatigue Significant fatigue possibly due to thyroid dysfunction, age, or stress. Recent stressors include her mother's hospitalizations and work issues. - Order lab work to evaluate thyroid function, anemia, and kidney function.  Sinus Headaches Unsteadiness and head pressure likely from sinus issues, occurring twice weekly. Relief with decongestants. Discussed lifestyle changes and imaging risks. - Advise hydration with 5-10 ounces of water every 1-2 hours. - Recommend balanced protein intake every 3-4 hours. - Consider imaging if headaches worsen despite lifestyle changes.  Constipation Constipation with small, hard bowel movements. Benefiber and Miralax may benefit from regular use. IBS may exacerbate symptoms. Hyoscyamine effective for spasms. - Advise daily use of Benefiber and Miralax. - Continue using hyoscyamine for  spasms as needed. - Encourage follow-up with GI for further evaluation and potential colonoscopy.  Allergic Rhinitis Severe  spring allergies managed with prescribed medications. Missed allergy appointment due to scheduling conflicts. - Continue current allergy management as prescribed by the allergy doctor.  General Health Maintenance Had shingles twice. Advised on shingles vaccine to prevent future outbreaks and reduce dementia risk. - Encourage consideration of the shingles vaccine.  Follow-up Needs to reschedule MR for kidney cyst and follow up with GI. Assistance with referral process and obtaining medical records discussed. - Reschedule MR for the cyst in the right kidney. - Assist with obtaining medical records for GI referral. - Follow up on lab results and imaging findings.     Randie Bustle, MD

## 2023-11-06 NOTE — Progress Notes (Signed)
 Reviewed via MyChart

## 2023-11-09 ENCOUNTER — Ambulatory Visit: Payer: Medicare Other | Admitting: Internal Medicine

## 2023-11-17 ENCOUNTER — Ambulatory Visit: Payer: Self-pay

## 2023-11-17 NOTE — Telephone Encounter (Signed)
 Patient called in requesting provider's recommendation of when and how much Benadryl  to take before her upcoming MRI scan. Per MAR, patient was advised to take 50 MG total x 1. Advised patient that Benadryl  is typically taken within 1 hour of the scan, but this RN would confirm with PCP. Please advise today, as MRI is this weekend.   Copied from CRM [id number]. Topic: Clinical - Medication Question >> Nov 17, 2023  9:43 AM Alyse July wrote: Reason for CRM: Patient has an MRI scheduled and has been prescribed prednisone  and would like a call back to discuss how she should take OTC medication Benadryl  in addition to other medication prior to her MRI scheduled for 11/19/23. [phone]. Reason for Disposition  [1] Caller requesting NON-URGENT health information AND [2] PCP's office is the best resource  Protocols used: Information Only Call - No Triage-A-AH

## 2023-11-19 ENCOUNTER — Ambulatory Visit (HOSPITAL_BASED_OUTPATIENT_CLINIC_OR_DEPARTMENT_OTHER)
Admission: RE | Admit: 2023-11-19 | Discharge: 2023-11-19 | Disposition: A | Discharge status: Home / Self Care | Source: Ambulatory Visit | Attending: Family Medicine | Admitting: Family Medicine

## 2023-11-19 DIAGNOSIS — N2889 Other specified disorders of kidney and ureter: Secondary | ICD-10-CM | POA: Insufficient documentation

## 2023-11-19 DIAGNOSIS — Z9049 Acquired absence of other specified parts of digestive tract: Secondary | ICD-10-CM | POA: Diagnosis not present

## 2023-11-19 MED ORDER — GADOBUTROL 1 MMOL/ML IV SOLN
9.0000 mL | Freq: Once | INTRAVENOUS | Status: AC | PRN
  Administered 2023-11-19: 9 mL via INTRAVENOUS

## 2023-11-20 ENCOUNTER — Encounter: Payer: Self-pay | Admitting: Family Medicine

## 2023-11-23 ENCOUNTER — Other Ambulatory Visit: Payer: Self-pay | Admitting: Family Medicine

## 2023-11-23 DIAGNOSIS — Z0000 Encounter for general adult medical examination without abnormal findings: Secondary | ICD-10-CM

## 2023-12-04 ENCOUNTER — Other Ambulatory Visit: Payer: Self-pay | Admitting: Family Medicine

## 2023-12-28 ENCOUNTER — Ambulatory Visit: Admitting: Internal Medicine

## 2023-12-28 ENCOUNTER — Ambulatory Visit
Admission: RE | Admit: 2023-12-28 | Discharge: 2023-12-28 | Disposition: A | Discharge status: Home / Self Care | Source: Ambulatory Visit | Attending: Family Medicine | Admitting: Family Medicine

## 2023-12-28 ENCOUNTER — Encounter: Payer: Self-pay | Admitting: Internal Medicine

## 2023-12-28 VITALS — BP 126/72 | HR 75 | Temp 98.4°F | Resp 20 | Ht 62.0 in | Wt 199.0 lb

## 2023-12-28 DIAGNOSIS — J4530 Mild persistent asthma, uncomplicated: Secondary | ICD-10-CM | POA: Diagnosis not present

## 2023-12-28 DIAGNOSIS — J3089 Other allergic rhinitis: Secondary | ICD-10-CM | POA: Diagnosis not present

## 2023-12-28 DIAGNOSIS — R053 Chronic cough: Secondary | ICD-10-CM

## 2023-12-28 DIAGNOSIS — J320 Chronic maxillary sinusitis: Secondary | ICD-10-CM

## 2023-12-28 DIAGNOSIS — Z0000 Encounter for general adult medical examination without abnormal findings: Secondary | ICD-10-CM

## 2023-12-28 DIAGNOSIS — J321 Chronic frontal sinusitis: Secondary | ICD-10-CM | POA: Diagnosis not present

## 2023-12-28 DIAGNOSIS — Z1231 Encounter for screening mammogram for malignant neoplasm of breast: Secondary | ICD-10-CM | POA: Diagnosis not present

## 2023-12-28 DIAGNOSIS — K219 Gastro-esophageal reflux disease without esophagitis: Secondary | ICD-10-CM

## 2023-12-28 DIAGNOSIS — G4733 Obstructive sleep apnea (adult) (pediatric): Secondary | ICD-10-CM

## 2023-12-28 MED ORDER — OMEPRAZOLE 40 MG PO CPDR
40.0000 mg | DELAYED_RELEASE_CAPSULE | Freq: Every day | ORAL | 3 refills | Status: AC | PRN
Start: 2023-12-28 — End: ?

## 2023-12-28 MED ORDER — MONTELUKAST SODIUM 10 MG PO TABS: ORAL_TABLET | ORAL | 1 refills | Status: DC

## 2023-12-28 MED ORDER — IPRATROPIUM BROMIDE 0.03 % NA SOLN
1.0000 | Freq: Two times a day (BID) | NASAL | 5 refills | Status: AC | PRN
Start: 2023-12-28 — End: ?

## 2023-12-28 MED ORDER — PULMICORT FLEXHALER 180 MCG/ACT IN AEPB: INHALATION_SPRAY | RESPIRATORY_TRACT | 1 refills | Status: DC

## 2023-12-28 MED ORDER — FLUTICASONE PROPIONATE 50 MCG/ACT NA SUSP: 2.0000 | Freq: Every day | NASAL | 5 refills | Status: DC

## 2023-12-28 MED ORDER — AZELASTINE HCL 0.1 % NA SOLN: NASAL | 5 refills | Status: DC

## 2023-12-28 MED ORDER — ALBUTEROL SULFATE (2.5 MG/3ML) 0.083% IN NEBU: 2.5000 mg | INHALATION_SOLUTION | Freq: Four times a day (QID) | RESPIRATORY_TRACT | 0 refills | Status: AC | PRN

## 2023-12-28 MED ORDER — ALBUTEROL SULFATE HFA 108 (90 BASE) MCG/ACT IN AERS: 2.0000 | INHALATION_SPRAY | Freq: Four times a day (QID) | RESPIRATORY_TRACT | 1 refills | Status: AC | PRN

## 2023-12-28 MED ORDER — FAMOTIDINE 40 MG PO TABS
40.0000 mg | ORAL_TABLET | Freq: Every day | ORAL | 3 refills | Status: AC
Start: 2023-12-28 — End: ?

## 2023-12-28 NOTE — Patient Instructions (Addendum)
 Mild persistent asthma-at goal Continue Pulmicort  Flexhaler  180 mcg 2 puffs once a day to help prevent cough and wheeze. Increase to 2 puffs twice daily during asthma flares.  Continue montelukast  (Singulair ) 10 mg once a day to help prevent cough and wheeze May use albuterol  2 puffs every 4-6 hours as needed for cough, wheeze, tightness in chest, shortness of breath.  Also may use albuterol  2 puffs 5 to 15 minutes prior to exercise  Allergic rhinoconjunctivitis  2021 skin testing shows intradermal's mildly reactive to grass pollen, tree pollen, mold mix #4, and dust mite) with eustachian tube dysfunction For nasal symptoms:  First use sinus rinse-use twice daily prior to medicated nasal sprays. Continue saline gel for nasal dryness Next use medicated nasal sprays: Continue azelastine  nasal spray 1-2 spray each nostril twice a day as needed for runny nose/stuffy nose/drainage down throat. Caution as this can be drying Continue fluticasone  1-2 sprays in each nostril once a day as needed for stuffy nose Continue atrovent  0.03% 1-2 sprays up to 2 times daily as needed-caution if this makes you too dry. May use Pataday  or Zaditor 1 drop each eye once a day as needed for itchy watery eyes. Claritin (loratadine) 10 mg once a day as needed for runny nose   Reflux Continue omeprazole  40 mg once a day as needed Continue pepcid  40 mg daily as needed (take before omeprazole  and if not controlled, take omeprazole ) Continue follow-up with GI  Chronic cough:  Suspected due to upper airway cough/reflux and drainage.  -use throat clearing tips as below -use nasal sprays as above.  Please let us  know if this treatment plan is not working well for you Schedule a follow-up appointment in 6 months or sooner if needed  CHRONIC THROAT CLEARING-WHAT TO DO!! Causes of chronic throat clearing may include the following (among others):  Acid reflux (lanyngopharyngeal reflux) Allergies (pollens, pet dander,  dust mites, etc) Non allergic rhinitis (runny nose, post nasal drainage or congestion NOT caused by allergies-can be secondary to pollutants, cold air, changes in blood vessels to the nose as we age,etc) Environmental irritants (tobacco, smoke, air pollution) Asthma If present for a long period of time, throat clearing can become a habit. We will work with you to treat any of the medical reasons for throat clearing.  Your job is to help prevent the habit, which can cause damage (redness and swelling) to your vocal cords.  It will require a conscious effort on your behalf.  Tips for prevention of throat clearing:  Instead of clearing your throat, swallow instead.   Carrying around water (or something to drink) will help you move the mucus in the right direction.  IF you have the urge to clear your throat, drink your water. If you absolutely have to clear your throat, use a non-traumatic exercise to do so.   Pant with your mouth open saying "[place], [place], [place]" with a powerful, breathy voice. Increase water intake.  This thins secretions, making them easier to swallow. Chew baking soda gum (ARM [employer] HAMMER) which can help with swallowing, reflux, and throat clearing.  Try to chew up to three times daily.   If you experience jaw pain or headaches, decrease the amount of chewing. Suck on sugar free hard candy to help with swallowing. Have your friends and family remind you to swallow when they hear you throat clearing.  As this can be habit forming, sometimes you may not realize you are doing this.  Having someone point  it out to you, will help you become more conscious of the behavior. BE PATIENT.  This will take time to resolve, and some do not see improvement until 8-12 weeks into therapy/behavior modifications.

## 2023-12-28 NOTE — Progress Notes (Signed)
 FOLLOW UP Date of Service/Encounter:   12/28/2023  Subjective:  Monica Wallace (DOB: [birth year]) is a 73 y.o. female who [facility] in re-evaluation of the following: Asthma, allergic rhinitis, GERD History obtained from: chart review and patient.  For Review, LV was on 05/10/23  with Dr.Romelo Sciandra seen for routine follow-up. See below for summary of history and diagnostics.   Therapeutic plans/changes recommended: She was diagnosed with acute sinusitis post COVID-19 infection and was started on Augmentin  for 10 days as well as a burst of prednisone . ----------------------------------------------------- Pertinent History/Diagnostics:  Asthma: Restrictive lung pattern. Follows with pulmonary.  Controlled on pulmicort  flexhaler 180 mcg 2 puffs daily, Singulair  10 mg. Allergic Rhinitis:  Previous sinus surgery with Dr.Teoh in June 2022. Has reflux following with GI. Has hx of ETD. - SPT environmental panel (2021): intradermal's mildly reactive to grass pollen, tree pollen, mold mix #4, and dust mite  Current Meds: Atrovent  nasal spray as needed, Flonase  daily, azelastine  nasal spray as needed, Pataday  eyedrops, Xyzal . Follows with Dr. Darlin Ehrlich ENT for recurrent sinusitis. Other: Has reflux on omeprazole  40 mg daily, follows with GI --------------------------------------------------- Today presents for follow-up. This winter was better for her.  She did have one URI, but manage well without need for prednisone . Her sinus are feeling better after surgery with Dr. Darlin Ehrlich who she still sees once yearly. She will get some eye pressure and some imbalance and has done vestibular rehabilitation which helped some.. She does notice dizziness and imbalance more with changes in barometric pressure which she manages with flonase  and sudafed only when this occurs which is not very often.  She does not use atrovent  often, only if has something going on. She does use  azelastine  more often, particularly this time of year when she is concerned about allergies.  She currently has some drainage and itchy throat. This is when she will use the azelastine  more consistently.   Not needing albuterol  often (maybe a few times per week during spring), but using pulmicort  2 puffs daily. If she uses albuterol , using prior to pulmicort . Only wheezed once after reflux episode since her last visit to clinic. Reflux reported as better, but acknowledges she needs to follow-up with GI. Currently taking omeprazole  only as needed and more consistently will use Pepcid  when symptomatic.  She does report a dry cough which has been present for several weeks.  She wonders if it is due to her enalapril  but she has been on this for over a year.  She reports that prior to coughing will have a tickle in her throat.  On questioning she is clearing her throat often.  We discussed possible contributing factors such as reflux and drainage.  Chart Review: 07/17/23: moderate sleep apnea, not using CPAP - didn't tolerate it well; follow-up as needed.  09/04/2023-last visit with Dr. Tijo for recurrent dizziness and chronic sinusitis, follow-up for vestibular rehabilitation with improvement in symptoms.  Continued on Flonase  and instructed to return for reevaluation in 6 months.  All medications reviewed by clinical staff and updated in chart. No new pertinent medical or surgical history except as noted in HPI.  ROS: All others negative except as noted per HPI.   Objective:  BP 126/72 (BP Location: Right Arm, Patient Position: Sitting, Cuff Size: Large)   Pulse 75   Temp 98.4 F (36.9 C) (Oral)   Resp 20   Ht 5' 2 (1.575 m)   Wt 199 lb (90.3 kg)  SpO2 97%   BMI 36.40 kg/m  Body mass index is 36.4 kg/m. Physical Exam: General Appearance:  Alert, cooperative, no distress, appears stated age  Head:  Normocephalic, without obvious abnormality, atraumatic  Eyes:  Conjunctiva clear, EOM's  intact  Ears EACs normal bilaterally and normal TMs bilaterally  Nose: Nares normal, hypertrophic turbinates, normal mucosa, and no visible anterior polyps  Throat: Lips, tongue normal; teeth and gums normal, normal posterior oropharynx  Neck: Supple, symmetrical  Lungs:   clear to auscultation bilaterally, Respirations unlabored, no coughing  Heart:  regular rate and rhythm and no murmur, Appears well perfused  Extremities: No edema  Skin: Skin color, texture, turgor normal and no rashes or lesions on visualized portions of skin  Neurologic: No gross deficits   Labs:  Lab Orders  No laboratory test(s) ordered today    Spirometry:  Tracings reviewed. Her effort: Good reproducible efforts. FVC: 1.69L FEV1: 1.43L, 86% predicted FEV1/FVC ratio: 0.82 Interpretation: Spirometry consistent with normal pattern.  Please see scanned spirometry results for details.  Assessment/Plan   Overall she is doing well since last visit.  Her asthma is well-controlled.  She does report a chronic cough which is suspected upper airway cough likely related to drainage as well as reflux.  She is planning on reestablishing care with GI.  We discussed ways to manage drainage.  She also continues to follow-up with ENT for her chronic sinusitis.  Mild persistent asthma-at goal Continue Pulmicort  Flexhaler  180 mcg 2 puffs once a day to help prevent cough and wheeze. Increase to 2 puffs twice daily during asthma flares.  Continue montelukast  (Singulair ) 10 mg once a day to help prevent cough and wheeze May use albuterol  2 puffs every 4-6 hours as needed for cough, wheeze, tightness in chest, shortness of breath.  Also may use albuterol  2 puffs 5 to 15 minutes prior to exercise  Allergic rhinoconjunctivitis - at goal 2021 skin testing shows intradermal's mildly reactive to grass pollen, tree pollen, mold mix #4, and dust mite) with eustachian tube dysfunction For nasal symptoms:  First use sinus rinse-use twice  daily prior to medicated nasal sprays. Continue saline gel for nasal dryness Next use medicated nasal sprays: Continue azelastine  nasal spray 1-2 spray each nostril twice a day as needed for runny nose/stuffy nose/drainage down throat. Caution as this can be drying Continue fluticasone  1-2 sprays in each nostril once a day as needed for stuffy nose Continue atrovent  0.03% 1-2 sprays up to 2 times daily as needed-caution if this makes you too dry. May use Pataday  or Zaditor 1 drop each eye once a day as needed for itchy watery eyes. Claritin (loratadine) 10 mg once a day as needed for runny nose   Reflux-stable Continue omeprazole  40 mg once a day as needed Continue pepcid  40 mg daily as needed (take before omeprazole  and if not controlled, take omeprazole ) Continue follow-up with GI  Chronic cough: suspected upper airway, chronic  Suspected due to upper airway cough/reflux and drainage.  -use throat clearing tips as below -use nasal sprays as above.  Please let us  know if this treatment plan is not working well for you Schedule a follow-up appointment in 6 months or sooner if needed  Other: none  Jonathon Neighbors, MD  [facility] 

## 2024-01-09 DIAGNOSIS — R300 Dysuria: Secondary | ICD-10-CM | POA: Diagnosis not present

## 2024-01-09 DIAGNOSIS — N3000 Acute cystitis without hematuria: Secondary | ICD-10-CM | POA: Diagnosis not present

## 2024-01-14 NOTE — Assessment & Plan Note (Signed)
 Supplement and monitor

## 2024-01-14 NOTE — Assessment & Plan Note (Signed)
 Encourage heart healthy diet such as MIND or DASH diet, increase exercise, avoid trans fats, simple carbohydrates and processed foods, consider a krill or fish or flaxseed oil cap daily. Tolerating Rosuvastatin

## 2024-01-14 NOTE — Assessment & Plan Note (Signed)
 Well controlled, no changes to meds. Encouraged heart healthy diet such as the DASH diet and exercise as tolerated.

## 2024-01-14 NOTE — Assessment & Plan Note (Signed)
 [id number] acceptable, minimize simple carbs. Increase exercise as tolerated. Continue current meds

## 2024-01-18 ENCOUNTER — Encounter: Payer: Self-pay | Admitting: Family Medicine

## 2024-01-18 ENCOUNTER — Ambulatory Visit (INDEPENDENT_AMBULATORY_CARE_PROVIDER_SITE_OTHER): Admitting: Family Medicine

## 2024-01-18 VITALS — BP 128/80 | HR 74 | Resp 16 | Ht 62.0 in | Wt 204.2 lb

## 2024-01-18 DIAGNOSIS — I10 Essential (primary) hypertension: Secondary | ICD-10-CM | POA: Diagnosis not present

## 2024-01-18 DIAGNOSIS — E890 Postprocedural hypothyroidism: Secondary | ICD-10-CM

## 2024-01-18 DIAGNOSIS — E7800 Pure hypercholesterolemia, unspecified: Secondary | ICD-10-CM

## 2024-01-18 DIAGNOSIS — E119 Type 2 diabetes mellitus without complications: Secondary | ICD-10-CM | POA: Diagnosis not present

## 2024-01-18 DIAGNOSIS — R300 Dysuria: Secondary | ICD-10-CM | POA: Diagnosis not present

## 2024-01-18 NOTE — Patient Instructions (Addendum)
 CBT Insomnia, [facility] APP, free   Managing Loss, Adult People experience loss in many different ways throughout their lives. Events such as moving, changing jobs, and losing friends can create a sense of loss. The loss may be as serious as a major health change, divorce, death of a pet, or death of a loved one. All of these types of loss are likely to create a physical and emotional reaction known as grief. Grief is the result of a major change or an absence of something or someone that you count on. Grief is a normal reaction to loss. A variety of factors can affect your grieving experience, including: The nature of your loss. Your relationship to what or whom you lost. Your understanding of grief and how to manage it. Your support system. Be aware that when grief becomes extreme, it can lead to more severe issues like isolation, depression, anxiety, or suicidal thoughts. Talk with your health care provider if you have any of these issues. How to manage lifestyle changes Keep to your normal routine as much as possible. If you have trouble focusing or doing normal activities, it is acceptable to take some time away from your normal routine. Spend time with friends and loved ones. Eat a healthy diet, get plenty of sleep, and rest when you feel tired. How to recognize changes  The way that you deal with your grief will affect your ability to function as you normally do. When grieving, you may experience these changes: Numbness, shock, sadness, anxiety, anger, denial, and guilt. Thoughts about death. Unexpected crying. A physical sensation of emptiness in your stomach. Problems sleeping and eating. Tiredness (fatigue). Loss of interest in normal activities. Dreaming about or imagining seeing the person who died. A need to remember what or whom you lost. Difficulty thinking about anything other than your loss for a period of time. Relief. If you have been expecting the  loss for a while, you may feel a sense of relief when it happens. Follow these instructions at home: Activity Express your feelings in healthy ways, such as: Talking with others about your loss. It may be helpful to find others who have had a similar loss, such as a support group. Writing down your feelings in a journal. Doing physical activities to release stress and emotional energy. Doing creative activities like painting, sculpting, or playing or listening to music. Practicing resilience. This is the ability to recover and adjust after facing challenges. Reading some resources that encourage resilience may help you to learn ways to practice those behaviors.  General instructions Be patient with yourself and others. Allow the grieving process to happen, and remember that grieving takes time. It is likely that you may never feel completely done with some grief. You may find a way to move on while still cherishing memories and feelings about your loss. Accepting your loss is a process. It can take months or longer to adjust. Keep all follow-up visits. This is important. Where to find support To get support for managing loss: Ask your health care provider for help and recommendations, such as grief counseling or therapy. Think about joining a support group for people who are managing a loss. Where to find more information You can find more information about managing loss from: American Society of Clinical Oncology: www.cancer.net American Psychological Association: [employer] Contact a health care provider if: Your grief is extreme and keeps getting worse. You have ongoing grief that does not improve. Your body shows symptoms of  grief, such as illness. You feel depressed, anxious, or hopeless. Get help right away if: You have thoughts about hurting yourself or others. Get help right away if you feel like you may hurt yourself or others, or have thoughts about taking your own life. Go  to your nearest emergency room or: Call 911. Call the National Suicide Prevention Lifeline at 810-543-3292 or 988. This is open 24 hours a day. Text the Crisis Text Line at 501-876-9833. Summary Grief is the result of a major change or an absence of someone or something that you count on. Grief is a normal reaction to loss. The depth of grief and the period of recovery depend on the type of loss and your ability to adjust to the change and process your feelings. Processing grief requires patience and a willingness to accept your feelings and talk about your loss with people who are supportive. It is important to find resources that work for you and to realize that people experience grief differently. There is not one grieving process that works for everyone in the same way. Be aware that when grief becomes extreme, it can lead to more severe issues like isolation, depression, anxiety, or suicidal thoughts. Talk with your health care provider if you have any of these issues. This information is not intended to replace advice given to you by your health care provider. Make sure you discuss any questions you have with your health care provider. Document Revised: 02/22/2021 Document Reviewed: 02/22/2021 Elsevier Patient Education  2024 [employer].

## 2024-01-19 LAB — URINALYSIS, ROUTINE W REFLEX MICROSCOPIC
Bilirubin Urine: NEGATIVE
Glucose, UA: NEGATIVE
Hgb urine dipstick: NEGATIVE
Ketones, ur: NEGATIVE
Leukocytes,Ua: NEGATIVE
Nitrite: NEGATIVE
Protein, ur: NEGATIVE
Specific Gravity, Urine: 1.009 (ref 1.001–1.035)
pH: 6 (ref 5.0–8.0)

## 2024-01-19 LAB — URINE CULTURE
MICRO NUMBER:: 16657784
Result:: NO GROWTH
SPECIMEN QUALITY:: ADEQUATE

## 2024-01-19 LAB — MICROALBUMIN / CREATININE URINE RATIO
Creatinine, Urine: 38 mg/dL (ref 20–275)
Microalb Creat Ratio: 11 mg/g{creat} (ref <30)
Microalb, Ur: 0.4 mg/dL

## 2024-01-20 ENCOUNTER — Ambulatory Visit: Payer: Self-pay | Admitting: Family Medicine

## 2024-01-21 ENCOUNTER — Encounter: Payer: Self-pay | Admitting: Family Medicine

## 2024-01-21 NOTE — Progress Notes (Signed)
 Subjective:    Patient ID: Monica Wallace, female    DOB: [birth year], 73 y.o.   MRN: [id number]  Chief Complaint  Patient presents with   Medical Management of Chronic Issues    Patient presents today for a 2 month follow-up.   Quality Metric Gaps    Foot exam, zoster, urine mircoalbumin     HPI Discussed the use of AI scribe software for clinical note transcription with the patient, who gave verbal consent to proceed.  History of Present Illness Monica Wallace is a 73 year old female who presents with bowel movement irregularities and sleep disturbances.  She experiences irregular bowel movements, occurring every two to three days, and uses Benefiber and Miralax once daily to manage this issue. On some days, her bowels do not move at all. No blood in her stool.  She has sleep disturbances exacerbated by stress related to recent family events, including planning a funeral and dealing with insurance matters. She uses Benadryl  to aid sleep, which has been effective.  She discusses the recent passing of her mother, who suffered from a stage four decubitus ulcer on her buttocks, initially misdiagnosed as stage two. Her mother had stopped eating, contributing to the severity of the ulcer. She expresses distress over her mother's suffering and the challenges faced in managing her care.    Past Medical History:  Diagnosis Date   Allergy History of food and drug   Arthritis    Asthma    Baker's cyst of knee, right 02/16/2017   Cancer (HCC) 2002   thyroid  (2002)   Cataract History surgery 2017   Cystic breast    Diabetes (HCC)    GERD (gastroesophageal reflux disease)    Hepatic cyst 02/02/2015   Hyperlipidemia    Hypertension    Insomnia 07/23/2016   Menopause    Migraine    Palpitations    Preventative health care 01/18/2016   Sleep apnea 02/02/2015   Thyroid  disease     Past Surgical History:  Procedure Laterality Date   ABDOMINAL HYSTERECTOMY     took right ovary and  uterus   APPENDECTOMY     CARPAL TUNNEL RELEASE Right    CESAREAN SECTION     CHOLECYSTECTOMY     EYE SURGERY Bilateral 2017   cataracts   KNEE ARTHROPLASTY     SHOULDER ARTHROSCOPY W/ ROTATOR CUFF REPAIR Left    THYROIDECTOMY  2002   TUBAL LIGATION      Family History  Problem Relation Age of Onset   Arthritis Mother    Transient ischemic attack Mother    Hypertension Mother    Hyperlipidemia Mother    Diabetes Father    Heart disease Father    Arthritis Father    Kidney disease Father    Hypertension Father    Hyperlipidemia Father    Cancer Sister 31       pancreatic   Fibromyalgia Sister    Allergic rhinitis Sister    Asthma Maternal Grandfather        smoker   Depression Maternal Grandfather    Dementia Paternal Grandmother    Diabetes Paternal Grandfather    Hypertension Brother    Allergic rhinitis Brother    Scoliosis Daughter        had rods placed and removed   Arthritis Sister    Gout Sister    GER disease Sister    Cancer Sister        breast cancer  Breast cancer Sister 46    Social History   Socioeconomic History   Marital status: Divorced    Spouse name: Not on file   Number of children: 2   Years of education: Not on file   Highest education level: Master's degree (e.g., MA, MS, MEng, MEd, MSW, MBA)  Occupational History   Not on file  Tobacco Use   Smoking status: Never   Smokeless tobacco: Never  Vaping Use   Vaping status: Never Used  Substance and Sexual Activity   Alcohol use: Yes    Comment: A few times a year   Drug use: No   Sexual activity: Yes    Birth control/protection: Post-menopausal, Surgical, Other-see comments    Comment:  boyfriend  Other Topics Concern   Not on file  Social History Narrative   Household: pt, daughter and sister    Social Drivers of Corporate investment banker Strain: Low Risk  (01/17/2024)   Overall Financial Resource Strain (CARDIA)    Difficulty of Paying Living Expenses: Not very hard   Food Insecurity: No Food Insecurity (01/17/2024)   Hunger Vital Sign    Worried About Running Out of Food in the Last Year: Never true    Ran Out of Food in the Last Year: Never true  Transportation Needs: No Transportation Needs (01/17/2024)   PRAPARE - Administrator, Civil Service (Medical): No    Lack of Transportation (Non-Medical): No  Physical Activity: Insufficiently Active (01/17/2024)   Exercise Vital Sign    Days of Exercise per Week: 2 days    Minutes of Exercise per Session: 10 min  Stress: Stress Concern Present (01/17/2024)   [employer] of Occupational Health - Occupational Stress Questionnaire    Feeling of Stress: Rather much  Social Connections: Moderately Isolated (01/17/2024)   Social Connection and Isolation Panel    Frequency of Communication with Friends and Family: More than three times a week    Frequency of Social Gatherings with Friends and Family: More than three times a week    Attends Religious Services: 1 to 4 times per year    Active Member of [employer] or Organizations: No    Attends Engineer, structural: Not on file    Marital Status: Divorced  Intimate Partner Violence: Not At Risk (04/04/2022)   Humiliation, Afraid, Rape, and Kick questionnaire    Fear of Current or Ex-Partner: No    Emotionally Abused: No    Physically Abused: No    Sexually Abused: No    Outpatient Medications Prior to Visit  Medication Sig Dispense Refill   acetaminophen  (TYLENOL ) 325 MG tablet Take 650 mg by mouth every 6 (six) hours as needed.     albuterol  (PROVENTIL ) (2.5 MG/3ML) 0.083% nebulizer solution Take 3 mLs (2.5 mg total) by nebulization every 6 (six) hours as needed for wheezing or shortness of breath. 75 mL 0   albuterol  (VENTOLIN  HFA) 108 (90 Base) MCG/ACT inhaler Inhale 2 puffs into the lungs every 6 (six) hours as needed for wheezing or shortness of breath. 18 g 1   azelastine  (ASTELIN ) 0.1 % nasal spray 1-2 sprays per nostril twice daily for  runny nose 30 mL 5   benzonatate  (TESSALON  PERLES) 100 MG capsule Take 2 capsules (200 mg total) by mouth 3 (three) times daily as needed for cough. 30 capsule 0   Blood Glucose Monitoring Suppl (ONETOUCH VERIO FLEX SYSTEM) w/Device KIT Use to check blood glucose once a day.  DX code: E11.9 1 kit 0   budesonide  (PULMICORT  FLEXHALER) 180 MCG/ACT inhaler 2 puffs once to twice daily to prevent coughing or wheezing. 3 each 1   Cholecalciferol ([employer] VIT D 5000 HIGH-POTENCY PO) Take 5,000 Units by mouth daily.     cyclobenzaprine  (FLEXERIL ) 5 MG tablet Take 1 tablet (5 mg total) by mouth 3 (three) times daily as needed for muscle spasms. 30 tablet 1   diphenhydrAMINE  (BENADRYL ) 25 mg capsule Take 2 capsules (50 mg total) by mouth once for 1 dose.     enalapril  (VASOTEC ) 2.5 MG tablet TAKE 1 TABLET(2.5 MG) BY MOUTH DAILY 90 tablet 1   estradiol  (ESTRACE  VAGINAL) 0.1 MG/GM vaginal cream Place 1 Applicatorful vaginally at bedtime. 42.5 g 12   famotidine  (PEPCID ) 40 MG tablet Take 1 tablet (40 mg total) by mouth at bedtime. 90 tablet 3   fluticasone  (FLONASE ) 50 MCG/ACT nasal spray Place 2 sprays into both nostrils daily. 16 g 5   furosemide  (LASIX ) 20 MG tablet 1 tab by mouth daily as needed for edema, weight gain >3lbs in 24 hours 30 tablet 3   glucose blood (ONETOUCH VERIO) test strip USE TO TEST BLOOD GLUCOSE ONCE DAILY AS DIRECTED 100 strip 1   hyoscyamine  (LEVSIN  SL) 0.125 MG SL tablet Take 1 tablet (0.125 mg total) by mouth every 4 (four) hours as needed. 30 tablet 2   ipratropium (ATROVENT ) 0.03 % nasal spray Place 1 spray into both nostrils 2 (two) times daily as needed for rhinitis. 30 mL 5   Lancets (ONETOUCH DELICA PLUS LANCET33G) MISC TEST BLOOD SUGAR ONCE A DAY 100 each 1   loratadine (CLARITIN REDITABS) 10 MG dissolvable tablet Take 10 mg by mouth daily.     meclizine  (ANTIVERT ) 25 MG tablet Take 1 tablet (25 mg total) by mouth 2 (two) times daily as needed for up to 20 doses for dizziness. 20  tablet 0   metoCLOPramide  (REGLAN ) 10 MG tablet Take 1 tablet (10 mg total) by mouth every 6 (six) hours. 30 tablet 0   montelukast  (SINGULAIR ) 10 MG tablet TAKE 1 TABLET(10 MG) BY MOUTH AT BEDTIME 90 tablet 1   Olopatadine  HCl 0.2 % SOLN Place 1 drop in each eye once a day as needed for itchy watery eyes 2.5 mL 5   omeprazole  (PRILOSEC) 40 MG capsule Take 1 capsule (40 mg total) by mouth daily as needed (reflux). 90 capsule 3   ondansetron  (ZOFRAN ) 4 MG tablet Take 1 tablet (4 mg total) by mouth every 8 (eight) hours as needed for nausea or vomiting. 20 tablet 0   potassium chloride  SA (KLOR-CON  M) 20 MEQ tablet TAKE 2 TABLETS BY MOUTH DAILY FOR 3 DAYS. THEN TAKE 1 TABLET BY MOUTH DAILY. 30 tablet 3   predniSONE  (DELTASONE ) 50 MG tablet 1 tab po 13 hours prior to imaging then 1 tab po 7 hours prior to imaging and 1 tab po 1 hour prior to imaging 3 tablet 0   rosuvastatin  (CRESTOR ) 5 MG tablet TAKE 1 TABLET(5 MG) BY MOUTH 2 TIMES A WEEK 25 tablet 1   SYNTHROID  137 MCG tablet Take 1 tablet (137 mcg total) by mouth as directed. 1 tablet 6 days out of the week 78 tablet 3   triamterene -hydrochlorothiazide  (MAXZIDE -25) 37.5-25 MG tablet TAKE 1 TABLET BY MOUTH DAILY 90 tablet 1   No facility-administered medications prior to visit.    Allergies  Allergen Reactions   Levaquin [Levofloxacin] Shortness Of Breath   Iodinated Contrast Media Itching  Clindamycin/Lincomycin Other (See Comments)    Pruritus No rash   Codeine Itching   Gabapentin  Itching    Review of Systems  Constitutional:  Negative for fever and malaise/fatigue.  HENT:  Negative for congestion.   Eyes:  Negative for blurred vision.  Respiratory:  Negative for shortness of breath.   Cardiovascular:  Negative for chest pain, palpitations and leg swelling.  Gastrointestinal:  Positive for constipation. Negative for abdominal pain, blood in stool and nausea.  Genitourinary:  Negative for dysuria and frequency.  Musculoskeletal:   Negative for falls.  Skin:  Negative for rash.  Neurological:  Negative for dizziness, loss of consciousness and headaches.  Endo/Heme/Allergies:  Negative for environmental allergies.  Psychiatric/Behavioral:  Negative for depression. The patient is nervous/anxious and has insomnia.        Objective:    Physical Exam Constitutional:      General: She is not in acute distress.    Appearance: Normal appearance. She is well-developed. She is not toxic-appearing.  HENT:     Head: Normocephalic and atraumatic.     Right Ear: External ear normal.     Left Ear: External ear normal.     Nose: Nose normal.  Eyes:     General:        Right eye: No discharge.        Left eye: No discharge.     Conjunctiva/sclera: Conjunctivae normal.  Neck:     Thyroid : No thyromegaly.  Cardiovascular:     Rate and Rhythm: Normal rate and regular rhythm.     Heart sounds: Normal heart sounds. No murmur heard. Pulmonary:     Effort: Pulmonary effort is normal. No respiratory distress.     Breath sounds: Normal breath sounds.  Abdominal:     General: Bowel sounds are normal.     Palpations: Abdomen is soft.     Tenderness: There is no abdominal tenderness. There is no guarding.  Musculoskeletal:        General: Normal range of motion.     Cervical back: Neck supple.  Lymphadenopathy:     Cervical: No cervical adenopathy.  Skin:    General: Skin is warm and dry.  Neurological:     Mental Status: She is alert and oriented to person, place, and time.  Psychiatric:        Mood and Affect: Mood normal.        Behavior: Behavior normal.        Thought Content: Thought content normal.        Judgment: Judgment normal.     BP 128/80   Pulse 74   Resp 16   Ht 5' 2 (1.575 m)   Wt 204 lb 3.2 oz (92.6 kg)   SpO2 98%   BMI 37.35 kg/m  Wt Readings from Last 3 Encounters:  01/18/24 204 lb 3.2 oz (92.6 kg)  12/28/23 199 lb (90.3 kg)  11/02/23 196 lb 9.6 oz (89.2 kg)    Diabetic Foot Exam -  Simple   No data filed    Lab Results  Component Value Date   WBC 8.6 11/02/2023   HGB 13.5 11/02/2023   HCT 41.2 11/02/2023   PLT 325.0 11/02/2023   GLUCOSE 96 11/02/2023   CHOL 202 (H) 11/02/2023   TRIG 123.0 11/02/2023   HDL 61.70 11/02/2023   LDLDIRECT 105.0 12/06/2022   LDLCALC 116 (H) 11/02/2023   ALT 17 11/02/2023   AST 21 11/02/2023   NA 140 11/02/2023  K 3.8 11/02/2023   CL 98 11/02/2023   CREATININE 0.88 11/02/2023   BUN 18 11/02/2023   CO2 33 (H) 11/02/2023   TSH 0.39 11/02/2023   INR 1.0 06/18/2021   HGBA1C 6.2 11/02/2023   MICROALBUR 0.4 01/18/2024    Lab Results  Component Value Date   TSH 0.39 11/02/2023   Lab Results  Component Value Date   WBC 8.6 11/02/2023   HGB 13.5 11/02/2023   HCT 41.2 11/02/2023   MCV 93.0 11/02/2023   PLT 325.0 11/02/2023   Lab Results  Component Value Date   NA 140 11/02/2023   K 3.8 11/02/2023   CO2 33 (H) 11/02/2023   GLUCOSE 96 11/02/2023   BUN 18 11/02/2023   CREATININE 0.88 11/02/2023   BILITOT 0.4 11/02/2023   ALKPHOS 76 11/02/2023   AST 21 11/02/2023   ALT 17 11/02/2023   PROT 7.1 11/02/2023   ALBUMIN 4.5 11/02/2023   CALCIUM  9.9 11/02/2023   ANIONGAP 12 06/21/2023   EGFR 81 06/17/2021   GFR 64.61 11/02/2023   Lab Results  Component Value Date   CHOL 202 (H) 11/02/2023   Lab Results  Component Value Date   HDL 61.70 11/02/2023   Lab Results  Component Value Date   LDLCALC 116 (H) 11/02/2023   Lab Results  Component Value Date   TRIG 123.0 11/02/2023   Lab Results  Component Value Date   CHOLHDL 3 11/02/2023   Lab Results  Component Value Date   HGBA1C 6.2 11/02/2023       Assessment & Plan:  Controlled type 2 diabetes mellitus without complication, without long-term current use of insulin (HCC) Assessment & Plan: hgba1c acceptable, minimize simple carbs. Increase exercise as tolerated. Continue current meds  Orders: -     Microalbumin / creatinine urine ratio  Primary  hypertension Assessment & Plan: Well controlled, no changes to meds. Encouraged heart healthy diet such as the DASH diet and exercise as tolerated.     Pure hypercholesterolemia Assessment & Plan: Encourage heart healthy diet such as MIND or DASH diet, increase exercise, avoid trans fats, simple carbohydrates and processed foods, consider a krill or fish or flaxseed oil cap daily. Tolerating Rosuvastatin    Postoperative hypothyroidism Assessment & Plan: Supplement and monitor   Dysuria -     Urine Culture -     Urinalysis, Routine w reflex microscopic    Assessment and Plan Assessment & Plan Constipation Experiences constipation with bowel movements every two to three days. Current regimen includes Benefiber and Miralax once daily. No hematochezia reported. Emphasized hydration due to increased dehydration risk with age. - Consider adding a second dose of Benefiber or Miralax every other day if bowel movements remain infrequent or uncomfortable. - Encourage hydration with a minimum of 60 ounces of water daily, especially in hot weather.  Insomnia Difficulty sleeping, potentially exacerbated by stress related to recent bereavement. Currently using Benadryl  for sleep. CBT-I suggested if sleep worsens. Additional medication can be considered if Benadryl  is insufficient. - Consider CBT-I if sleep worsens. - Evaluate the need for additional medication for sleep if Benadryl  is insufficient.  General Health Maintenance Discussion about general health maintenance and upcoming lab work. - Schedule lab work for later in the summer or fall.     Harlene Horton, MD

## 2024-01-22 NOTE — Progress Notes (Signed)
Patient reviewed via MyChart.

## 2024-01-23 ENCOUNTER — Other Ambulatory Visit: Payer: Self-pay | Admitting: Family Medicine

## 2024-01-29 ENCOUNTER — Other Ambulatory Visit: Payer: Self-pay | Admitting: Internal Medicine

## 2024-02-20 ENCOUNTER — Encounter: Payer: Self-pay | Admitting: Family Medicine

## 2024-02-27 ENCOUNTER — Ambulatory Visit: Payer: Medicare Other | Admitting: Internal Medicine

## 2024-03-01 ENCOUNTER — Ambulatory Visit (INDEPENDENT_AMBULATORY_CARE_PROVIDER_SITE_OTHER): Payer: Medicare Other | Admitting: Otolaryngology

## 2024-03-28 ENCOUNTER — Other Ambulatory Visit: Payer: Self-pay | Admitting: Family Medicine

## 2024-03-28 ENCOUNTER — Ambulatory Visit (INDEPENDENT_AMBULATORY_CARE_PROVIDER_SITE_OTHER): Admitting: Internal Medicine

## 2024-03-28 ENCOUNTER — Encounter: Payer: Self-pay | Admitting: Internal Medicine

## 2024-03-28 VITALS — BP 124/70 | HR 62 | Ht 62.0 in | Wt 205.2 lb

## 2024-03-28 DIAGNOSIS — E890 Postprocedural hypothyroidism: Secondary | ICD-10-CM

## 2024-03-28 DIAGNOSIS — Z85850 Personal history of malignant neoplasm of thyroid: Secondary | ICD-10-CM

## 2024-03-28 DIAGNOSIS — R600 Localized edema: Secondary | ICD-10-CM

## 2024-03-28 NOTE — Progress Notes (Unsigned)
 Name: Monica Wallace  MRN/ DOB: [id number], [birth year]    Age/ Sex: 73 y.o., female     PCP: Domenica Harlene LABOR, MD   Reason for Endocrinology Evaluation: Postoperative hypothyroidism     Initial Endocrinology Clinic Visit: 12/08/2020    PATIENT IDENTIFIER: [name] is a 73 y.o., female with a past medical history of T2DM, Dyslipidemia and Hypothyroidism. She has followed with [facility] since 12/08/2020 for consultative assistance with management of her postoperative hypothyroidism .    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    An ultrasound in 2002 showed MNG with a very large right thyoid nodule 3.6x2.3x1.7 cm    She is S/P total thyroidectomy in 2002 , for PTC ,encapsulated tumor, low grade     12/18/2020 S/P RAI remnant ablation 100 mCi of I-131.      12/28/2000: Posttreatment whole-body scan: Residual functioning thyroid  tissue in the thyroid  bed in the midline and extending towards the left. No distant metastases identified. 08/02/2006: Thyrogen stimulated I-123 whole body scan: Normal physiologic uptake in nares, salivary glands, gastrointestinal tract and urinary tract. No abnormal uptake indicating any functioning thyroid  tissue.        Mother with thyroid  disease , S/P total thyroidectomy due to benign nodule.        SUBJECTIVE:    Today (03/28/2024):  [name] is here for a follow up on hypothyroidism .  The patient has NOT been to our clinic in 13 months.   Patient follows with rheumatology for erosive osteoarthritis Weight has remained stable over the past year No local neck swelling  Has sinus issues and cervical lymphadenopathy No tremors  No palpitations  Continues with IBS with alternating bowel movement  No Biotin    Levothyroxine  137 mcg daily except Saturday   HISTORY:  Past Medical History:   Past Medical History:  Diagnosis Date   Allergy History of food and drug   Arthritis    Asthma    Baker's cyst of knee, right 02/16/2017   Cancer (HCC) 2002   thyroid  (2002)   Cataract History surgery 2017   Cystic breast    Diabetes (HCC)    GERD (gastroesophageal reflux disease)    Hepatic cyst 02/02/2015   Hyperlipidemia    Hypertension    Insomnia 07/23/2016   Menopause    Migraine    Palpitations    Preventative health care 01/18/2016   Sleep apnea 02/02/2015   Thyroid  disease    Past Surgical History:  Past Surgical History:  Procedure Laterality Date   ABDOMINAL HYSTERECTOMY     took right ovary and uterus   APPENDECTOMY     CARPAL TUNNEL RELEASE Right    CESAREAN SECTION     CHOLECYSTECTOMY     EYE SURGERY Bilateral 2017   cataracts   KNEE ARTHROPLASTY     SHOULDER ARTHROSCOPY W/ ROTATOR CUFF REPAIR Left    THYROIDECTOMY  2002   TUBAL LIGATION     Social History:  reports that she has never smoked. She has never used smokeless tobacco. She reports current alcohol use. She reports that she does not use drugs. Family History:  Family History  Problem Relation Age of Onset   Arthritis Mother    Transient ischemic attack Mother    Hypertension Mother    Hyperlipidemia Mother    Diabetes Father  Heart disease Father    Arthritis Father    Kidney disease Father    Hypertension Father    Hyperlipidemia Father    Cancer Sister 98       pancreatic   Fibromyalgia Sister    Allergic rhinitis Sister    Asthma Maternal Grandfather        smoker   Depression Maternal Grandfather    Dementia Paternal Grandmother    Diabetes Paternal Grandfather    Hypertension Brother    Allergic rhinitis Brother    Scoliosis Daughter        had rods placed and removed   Arthritis Sister    Gout Sister    GER disease Sister    Cancer Sister        breast cancer   Breast cancer Sister 61     HOME MEDICATIONS: Allergies as of 03/28/2024       Reactions    Levaquin [levofloxacin] Shortness Of Breath   Iodinated Contrast Media Itching   Clindamycin/lincomycin Other (See Comments)   Pruritus No rash   Codeine Itching   Gabapentin  Itching        Medication List        Accurate as of March 28, 2024  8:14 AM. If you have any questions, ask your nurse or doctor.          acetaminophen  325 MG tablet Commonly known as: TYLENOL  Take 650 mg by mouth every 6 (six) hours as needed.   albuterol  108 (90 Base) MCG/ACT inhaler Commonly known as: VENTOLIN  HFA Inhale 2 puffs into the lungs every 6 (six) hours as needed for wheezing or shortness of breath.   albuterol  (2.5 MG/3ML) 0.083% nebulizer solution Commonly known as: PROVENTIL  Take 3 mLs (2.5 mg total) by nebulization every 6 (six) hours as needed for wheezing or shortness of breath.   azelastine  0.1 % nasal spray Commonly known as: ASTELIN  1-2 sprays per nostril twice daily for runny nose   benzonatate  100 MG capsule Commonly known as: Tessalon  Perles Take 2 capsules (200 mg total) by mouth 3 (three) times daily as needed for cough.   [employer] VIT D 5000 HIGH-POTENCY PO Take 5,000 Units by mouth daily.   cyclobenzaprine  5 MG tablet Commonly known as: FLEXERIL  Take 1 tablet (5 mg total) by mouth 3 (three) times daily as needed for muscle spasms.   diphenhydrAMINE  25 mg capsule Commonly known as: BENADRYL  Take 2 capsules (50 mg total) by mouth once for 1 dose.   enalapril  2.5 MG tablet Commonly known as: VASOTEC  TAKE 1 TABLET(2.5 MG) BY MOUTH DAILY   estradiol  0.1 MG/GM vaginal cream Commonly known as: ESTRACE  VAGINAL Place 1 Applicatorful vaginally at bedtime.   famotidine  40 MG tablet Commonly known as: PEPCID  Take 1 tablet (40 mg total) by mouth at bedtime.   fluticasone  50 MCG/ACT nasal spray Commonly known as: FLONASE  Place 2 sprays into both nostrils daily.   furosemide  20 MG tablet Commonly known as: LASIX  1 tab by mouth daily as needed for edema, weight  gain >3lbs in 24 hours   hyoscyamine  0.125 MG SL tablet Commonly known as: LEVSIN  SL Take 1 tablet (0.125 mg total) by mouth every 4 (four) hours as needed.   ipratropium 0.03 % nasal spray Commonly known as: ATROVENT  Place 1 spray into both nostrils 2 (two) times daily as needed for rhinitis.   loratadine 10 MG dissolvable tablet Commonly known as: CLARITIN REDITABS Take 10 mg by mouth daily.   meclizine  25 MG  tablet Commonly known as: ANTIVERT  Take 1 tablet (25 mg total) by mouth 2 (two) times daily as needed for up to 20 doses for dizziness.   metoCLOPramide  10 MG tablet Commonly known as: REGLAN  Take 1 tablet (10 mg total) by mouth every 6 (six) hours.   montelukast  10 MG tablet Commonly known as: SINGULAIR  TAKE 1 TABLET(10 MG) BY MOUTH AT BEDTIME   Olopatadine  HCl 0.2 % Soln Place 1 drop in each eye once a day as needed for itchy watery eyes   omeprazole  40 MG capsule Commonly known as: PRILOSEC Take 1 capsule (40 mg total) by mouth daily as needed (reflux).   ondansetron  4 MG tablet Commonly known as: Zofran  Take 1 tablet (4 mg total) by mouth every 8 (eight) hours as needed for nausea or vomiting.   OneTouch Delica Plus Lancet33G Misc TEST BLOOD SUGAR ONCE A DAY   OneTouch Verio Flex System w/Device Kit Use to check blood glucose once a day.  DX code: E11.9   OneTouch Verio test strip Generic drug: glucose blood USE TO TEST BLOOD GLUCOSE ONCE DAILY AS DIRECTED   potassium chloride  SA 20 MEQ tablet Commonly known as: KLOR-CON  M TAKE 2 TABLETS BY MOUTH DAILY FOR 3 DAYS. THEN TAKE 1 TABLET BY MOUTH DAILY.   predniSONE  50 MG tablet Commonly known as: DELTASONE  1 tab po 13 hours prior to imaging then 1 tab po 7 hours prior to imaging and 1 tab po 1 hour prior to imaging   Pulmicort  Flexhaler 180 MCG/ACT inhaler Generic drug: budesonide  2 puffs once to twice daily to prevent coughing or wheezing.   rosuvastatin  5 MG tablet Commonly known as: CRESTOR  TAKE  1 TABLET(5 MG) BY MOUTH 2 TIMES A WEEK   Synthroid  137 MCG tablet Generic drug: levothyroxine  TAKE 1 TABLET AS DIRECTED(6 DAYS OUT OF THE WEEK)   triamterene -hydrochlorothiazide  37.5-25 MG tablet Commonly known as: MAXZIDE -25 TAKE 1 TABLET BY MOUTH DAILY          OBJECTIVE:   PHYSICAL EXAM: VS:BP 124/70 (BP Location: Left Arm, Patient Position: Sitting, Cuff Size: Normal)   Pulse 62   Ht 5' 2 (1.575 m)   Wt 205 lb 3.2 oz (93.1 kg)   SpO2 99%   BMI 37.53 kg/m    EXAM: General: Pt appears well and is in NAD  Neck: General: Supple without adenopathy. Thyroid : No goiter or nodules appreciated.   Lungs: Clear with good BS bilat  Heart: Auscultation: RRR.  Extremities:  BL LE: trace pretibial edema   Mental Status: Judgment, insight: Intact Orientation: Oriented to time, place, and person Memory: Intact for recent and remote events Mood and affect: No depression, anxiety, or agitation     DATA REVIEWED:  ****    Thyroid  bed ultrasound 06/26/2021 No residual thyroid  tissue is identified in the thyroidectomy bed.   Nonenlarged bilateral neck lymph nodes are again seen.   IMPRESSION: No evidence of recurrent or residual thyroid  malignancy.  ASSESSMENT / PLAN / RECOMMENDATIONS:   Postsurgical Hypothyroidism :  -Patient is clinically and biochemically euthyroid - TSH at goal, no changes    -Medications : Continue Synthroid  137 MCG , 6 days out of the week   2. Hx pf PTC:   - TSH goal 0.5-2.0 uIU/mL  - She has excellent response to treatment - She is at low risk of recurrence - Tg and Tg antibody have been undetectable in the past, pending today -Thyroid  bed ultrasound in December 2022 did not show any evidence of  recurrence nor residual thyroid  malignancy    F/U in 1 yr   Signed electronically by: Stefano Redgie Butts, MD  [facility] End[facility]  [facility] [address] [place] [place], [place] [address] Phone:  [phone] FAX: [phone]      CC: Domenica Harlene LABOR, MD [address] [place] [address] Phone: [phone]  Fax: [phone]   Return to Endocrinology clinic as below: Future Appointments  Date Time Provider Department Center  04/03/2024  4:00 PM Karis Clunes, MD [facility] None  06/10/2024  3:20 PM Domenica Harlene LABOR, MD [facility] [id number]  07/03/2024  4:00 PM Marinda Rocky SAILOR, MD [facility] None

## 2024-03-28 NOTE — Patient Instructions (Signed)

## 2024-03-29 ENCOUNTER — Ambulatory Visit: Payer: Self-pay | Admitting: Internal Medicine

## 2024-03-29 MED ORDER — SYNTHROID 137 MCG PO TABS: 137.0000 ug | ORAL_TABLET | Freq: Every day | ORAL | 3 refills | Status: AC

## 2024-04-01 LAB — THYROGLOBULIN LEVEL: Thyroglobulin: 0.1 ng/mL — ABNORMAL LOW

## 2024-04-01 LAB — THYROGLOBULIN ANTIBODY: Thyroglobulin Ab: <1 [IU]/mL (ref <=1)

## 2024-04-01 LAB — TSH: TSH: 1.47 m[IU]/L (ref 0.40–4.50)

## 2024-04-03 ENCOUNTER — Ambulatory Visit (INDEPENDENT_AMBULATORY_CARE_PROVIDER_SITE_OTHER): Admitting: Otolaryngology

## 2024-04-03 ENCOUNTER — Encounter (INDEPENDENT_AMBULATORY_CARE_PROVIDER_SITE_OTHER): Payer: Self-pay | Admitting: Otolaryngology

## 2024-04-03 VITALS — BP 133/73 | HR 84

## 2024-04-03 DIAGNOSIS — J343 Hypertrophy of nasal turbinates: Secondary | ICD-10-CM

## 2024-04-03 DIAGNOSIS — J0101 Acute recurrent maxillary sinusitis: Secondary | ICD-10-CM

## 2024-04-03 DIAGNOSIS — R42 Dizziness and giddiness: Secondary | ICD-10-CM | POA: Diagnosis not present

## 2024-04-03 DIAGNOSIS — J310 Chronic rhinitis: Secondary | ICD-10-CM

## 2024-04-03 DIAGNOSIS — R0981 Nasal congestion: Secondary | ICD-10-CM | POA: Diagnosis not present

## 2024-04-03 MED ORDER — AMOXICILLIN-POT CLAVULANATE 875-125 MG PO TABS: 1.0000 | ORAL_TABLET | Freq: Two times a day (BID) | ORAL | 0 refills | Status: AC

## 2024-04-03 NOTE — Progress Notes (Signed)
 Patient ID: Monica Wallace, female   DOB: [birth year], 73 y.o.   MRN: [id number]  Follow-up: Recurrent dizziness, chronic frontal and maxillary sinusitis   HPI: The patient is a 73 year old female who returns today for her follow-up evaluation.  The patient was previously seen for recurrent dizziness and chronic frontal and maxillary sinusitis.  She was treated with balloon sinuplasty procedure in June 2022.  Her dizziness was found to be a result of unilateral right vestibular weakness.  She was treated with vestibular therapy.  The patient returns today complaining of left facial pressure for the past 2 months.  She has been having frequent sinus headaches.  She had 1 episode of vertigo last month.  Currently she denies any fever or visual change.  Exam: General: Communicates without difficulty, well nourished, no acute distress. Head: Normocephalic, no evidence injury, no tenderness, facial buttresses intact without stepoff. Face/sinus: No tenderness to palpation and percussion. Facial movement is normal and symmetric. Eyes: PERRL, EOMI. No scleral icterus, conjunctivae clear. Neuro: CN II exam reveals vision grossly intact.  No nystagmus at any point of gaze. Ears: Auricles well formed without lesions.  Ear canals are intact without mass or lesion.  No erythema or edema is appreciated.  The TMs are intact without fluid. Nose: External evaluation reveals normal support and skin without lesions.  Dorsum is intact.  Anterior rhinoscopy reveals erythematous and congested mucosa over anterior aspect of inferior turbinates and intact septum.  Oral:  Oral cavity and oropharynx are intact, symmetric, without erythema or edema.  Mucosa is moist without lesions. Neck: Full range of motion without pain.  There is no significant lymphadenopathy.  No masses palpable.  Thyroid  bed within normal limits to palpation.  Parotid glands and submandibular glands equal bilaterally without mass.  Trachea is midline. Neuro:  CN  2-12 grossly intact.   Assessment: 1.  Chronic rhinosinusitis, with nasal mucosal congestion and bilateral inferior turbinate hypertrophy.  Erythematous and edematous nasal mucosa is noted today. 2.  Chronic dizziness, secondary to her unilateral right vestibular weakness.  Her dizziness is currently under control with the vestibular exercises.  Her last vertigo was 1 month ago.  Plan: 1.  The physical exam findings are reviewed with the patient. 2.  Continue with Flonase  and azelastine  nasal sprays.  Nasal saline irrigation is encouraged. 3.  Augmentin  875 mg p.o. twice daily for 7 days.  The possible side effects are discussed. 4.  Daily balance exercises at home. 5.  The patient will return for reevaluation in 6 months, sooner if needed.

## 2024-04-07 DIAGNOSIS — J0101 Acute recurrent maxillary sinusitis: Secondary | ICD-10-CM | POA: Insufficient documentation

## 2024-05-20 ENCOUNTER — Other Ambulatory Visit: Payer: Self-pay | Admitting: Internal Medicine

## 2024-05-22 ENCOUNTER — Ambulatory Visit

## 2024-05-23 ENCOUNTER — Other Ambulatory Visit: Payer: Self-pay | Admitting: Internal Medicine

## 2024-05-26 DIAGNOSIS — R059 Cough, unspecified: Secondary | ICD-10-CM | POA: Diagnosis not present

## 2024-05-26 DIAGNOSIS — J209 Acute bronchitis, unspecified: Secondary | ICD-10-CM | POA: Diagnosis not present

## 2024-05-28 DIAGNOSIS — M1711 Unilateral primary osteoarthritis, right knee: Secondary | ICD-10-CM | POA: Diagnosis not present

## 2024-05-29 ENCOUNTER — Ambulatory Visit (INDEPENDENT_AMBULATORY_CARE_PROVIDER_SITE_OTHER)

## 2024-05-29 ENCOUNTER — Ambulatory Visit: Payer: Self-pay | Admitting: Internal Medicine

## 2024-05-29 ENCOUNTER — Ambulatory Visit (INDEPENDENT_AMBULATORY_CARE_PROVIDER_SITE_OTHER): Admitting: Internal Medicine

## 2024-05-29 ENCOUNTER — Encounter: Payer: Self-pay | Admitting: Internal Medicine

## 2024-05-29 VITALS — BP 133/64 | HR 91 | Temp 97.8°F | Ht 61.0 in | Wt 205.2 lb

## 2024-05-29 DIAGNOSIS — R918 Other nonspecific abnormal finding of lung field: Secondary | ICD-10-CM | POA: Diagnosis not present

## 2024-05-29 DIAGNOSIS — R051 Acute cough: Secondary | ICD-10-CM | POA: Diagnosis not present

## 2024-05-29 DIAGNOSIS — J22 Unspecified acute lower respiratory infection: Secondary | ICD-10-CM | POA: Diagnosis not present

## 2024-05-29 DIAGNOSIS — R0989 Other specified symptoms and signs involving the circulatory and respiratory systems: Secondary | ICD-10-CM | POA: Diagnosis not present

## 2024-05-29 DIAGNOSIS — R059 Cough, unspecified: Secondary | ICD-10-CM | POA: Diagnosis not present

## 2024-05-29 LAB — POC COVID19 BINAXNOW: SARS Coronavirus 2 Ag: NEGATIVE

## 2024-05-29 LAB — POCT INFLUENZA A/B
Influenza A, POC: NEGATIVE
Influenza B, POC: NEGATIVE

## 2024-05-29 MED ORDER — AMOXICILLIN-POT CLAVULANATE 875-125 MG PO TABS: 1.0000 | ORAL_TABLET | Freq: Two times a day (BID) | ORAL | 0 refills | Status: AC

## 2024-05-29 NOTE — Patient Instructions (Signed)
 Continue prednisone , duonebs, cough syrup STOP Cefdinir . START Augmentin .

## 2024-05-29 NOTE — Progress Notes (Signed)
 [facility] [address] [place] [address] Dept: [phone] Dept Fax: [phone]  Acute Care Office Visit  Subjective:   Monica Wallace 12-07-1950 05/29/2024  Chief Complaint  Patient presents with   Cough    Started Friday cough, wheezing, urgent gave meds     HPI: Discussed the use of AI scribe software for clinical note transcription with the patient, who gave verbal consent to proceed.  History of Present Illness   Monica Wallace is a 73 year old female with asthma who presents with worsening respiratory symptoms.  Her symptoms began on Friday evening with an irritating cough and a slight sore throat. By Saturday, she experienced productive coughing with clear to slightly green and occasionally tannish sputum. She also noted wheezing and chest congestion, which started on the first night, and described her chest as 'real noisy' with wheezing and rattling sounds that kept her awake.  She has a history of asthma, which rarely flares up but can be triggered by upper respiratory infections. She usually experiences bronchitis, but this is the first time she has had significant chest congestion. She visited urgent care on Sunday, where she was prescribed a steroid pack and DuoNeb, but could not start the DuoNeb until Monday due to pharmacy delays. She was also given Dextromethorphan /guaifenesin  for her cough, as she is allergic to codeine.  She was initially prescribed cefdinir  but experienced itching after taking it yesterday.   She reports low-grade fever and a feeling of tightness in the upper chest. She has not experienced significant sinus congestion this time, which is unusual for her, as her respiratory issues typically start with a sinus infection. She has a history of sinus pressure and has been seeing an ENT for these issues.    The following portions of the patient's history were reviewed and updated as  appropriate: past medical history, past surgical history, family history, social history, allergies, medications, and problem list.   Patient Active Problem List   Diagnosis Date Noted   Acute recurrent maxillary sinusitis 04/07/2024   Chronic frontal sinusitis 09/06/2023   Chronic maxillary sinusitis 09/06/2023   Hypertrophy of nasal turbinates 09/06/2023   Mild persistent asthma without complication 04/22/2022   Dysfunction of both eustachian tubes 04/22/2022   UTI symptoms 04/07/2022   Hx of papillary thyroid  carcinoma 02/28/2022   Left ankle pain 02/09/2022   Dizziness 02/08/2022   Edema 02/08/2022   Unsteady gait 11/14/2021   Lumbar radiculopathy 09/09/2021   Cervical cancer screening 03/07/2021   Sinusitis 10/30/2020   Allergic conjunctivitis 04/08/2020   Neck pain 02/06/2020   Right knee pain 09/30/2019   COVID-19 09/30/2019   Educated about COVID-19 virus infection 12/05/2018   Baker's cyst of knee, right 02/16/2017   Insomnia 07/23/2016   Hypokalemia 07/23/2016   Controlled type 2 diabetes mellitus without complication, without long-term current use of insulin (HCC) 01/24/2016   Preventative health care 01/18/2016   Left-sided low back pain with left-sided sciatica 10/07/2015   Obesity (BMI 30-39.9) 06/05/2015   OSA (obstructive sleep apnea) 02/02/2015   Palpitations 09/19/2014   Plantar fasciitis, bilateral 11/19/2013   Pronation deformity of ankle, acquired 11/19/2013   DJD (degenerative joint disease) 08/26/2013   Fibrocystic breast 08/26/2013   HTN (hypertension) 08/26/2013   GERD (gastroesophageal reflux disease) 08/26/2013   S/P hysterectomy with oophorectomy 08/26/2013   Hypothyroidism 08/26/2013   Chronic hoarseness 08/26/2013   Colon polyp 08/26/2013   Hiatal hernia 08/26/2013   Irritable bowel  syndrome 08/26/2013   Allergic rhinoconjunctivitis 08/26/2013   Hyperlipidemia 08/22/2013   Migraine 08/22/2013   Thyroid  cancer (HCC) 08/22/2013   Past  Medical History:  Diagnosis Date   Allergy History of food and drug   Arthritis    Asthma    Baker's cyst of knee, right 02/16/2017   Cancer (HCC) 2002   thyroid  (2002)   Cataract History surgery 2017   Cystic breast    Diabetes (HCC)    GERD (gastroesophageal reflux disease)    Hepatic cyst 02/02/2015   Hyperlipidemia    Hypertension    Insomnia 07/23/2016   Menopause    Migraine    Palpitations    Preventative health care 01/18/2016   Sleep apnea 02/02/2015   Thyroid  disease    Past Surgical History:  Procedure Laterality Date   ABDOMINAL HYSTERECTOMY     took right ovary and uterus   APPENDECTOMY     CARPAL TUNNEL RELEASE Right    CESAREAN SECTION     CHOLECYSTECTOMY     EYE SURGERY Bilateral 2017   cataracts   KNEE ARTHROPLASTY     SHOULDER ARTHROSCOPY W/ ROTATOR CUFF REPAIR Left    THYROIDECTOMY  2002   TUBAL LIGATION     Family History  Problem Relation Age of Onset   Arthritis Mother    Transient ischemic attack Mother    Hypertension Mother    Hyperlipidemia Mother    Diabetes Father    Heart disease Father    Arthritis Father    Kidney disease Father    Hypertension Father    Hyperlipidemia Father    Cancer Sister 64       pancreatic   Fibromyalgia Sister    Allergic rhinitis Sister    Asthma Maternal Grandfather        smoker   Depression Maternal Grandfather    Dementia Paternal Grandmother    Diabetes Paternal Grandfather    Hypertension Brother    Allergic rhinitis Brother    Scoliosis Daughter        had rods placed and removed   Arthritis Sister    Gout Sister    GER disease Sister    Cancer Sister        breast cancer   Breast cancer Sister 46    Current Outpatient Medications:    albuterol  (PROVENTIL ) (2.5 MG/3ML) 0.083% nebulizer solution, Take 3 mLs (2.5 mg total) by nebulization every 6 (six) hours as needed for wheezing or shortness of breath., Disp: 75 mL, Rfl: 0   albuterol  (VENTOLIN  HFA) 108 (90 Base) MCG/ACT inhaler,  Inhale 2 puffs into the lungs every 6 (six) hours as needed for wheezing or shortness of breath., Disp: 18 g, Rfl: 1   amoxicillin -clavulanate (AUGMENTIN ) 875-125 MG tablet, Take 1 tablet by mouth 2 (two) times daily for 7 days., Disp: 14 tablet, Rfl: 0   azelastine  (ASTELIN ) 0.1 % nasal spray, USE 1 TO 2 SPRAYS IN EACH NOSTRIL TWICE DAILY FOR RUNNY NOSE, Disp: 30 mL, Rfl: 5   Blood Glucose Monitoring Suppl (ONETOUCH VERIO FLEX SYSTEM) w/Device KIT, Use to check blood glucose once a day.  DX code: E11.9, Disp: 1 kit, Rfl: 0   budesonide  (PULMICORT  FLEXHALER) 180 MCG/ACT inhaler, 2 puffs once to twice daily to prevent coughing or wheezing., Disp: 3 each, Rfl: 1   Cholecalciferol ([employer] VIT D 5000 HIGH-POTENCY PO), Take 5,000 Units by mouth daily., Disp: , Rfl:    cyclobenzaprine  (FLEXERIL ) 5 MG tablet, Take 1 tablet (5  mg total) by mouth 3 (three) times daily as needed for muscle spasms., Disp: 30 tablet, Rfl: 1   enalapril  (VASOTEC ) 2.5 MG tablet, TAKE 1 TABLET(2.5 MG) BY MOUTH DAILY, Disp: 90 tablet, Rfl: 1   famotidine  (PEPCID ) 40 MG tablet, Take 1 tablet (40 mg total) by mouth at bedtime., Disp: 90 tablet, Rfl: 3   fluticasone  (FLONASE ) 50 MCG/ACT nasal spray, SHAKE LIQUID AND USE 2 SPRAYS IN EACH NOSTRIL DAILY, Disp: 48 g, Rfl: 5   furosemide  (LASIX ) 20 MG tablet, TAKE 1 TABLET BY MOUTH DAILY AS NEEDED FOR SWELLING, Disp: 90 tablet, Rfl: 0   glucose blood (ONETOUCH VERIO) test strip, USE TO TEST BLOOD GLUCOSE ONCE DAILY AS DIRECTED, Disp: 100 strip, Rfl: 1   hyoscyamine  (LEVSIN  SL) 0.125 MG SL tablet, Take 1 tablet (0.125 mg total) by mouth every 4 (four) hours as needed., Disp: 30 tablet, Rfl: 2   ipratropium (ATROVENT ) 0.03 % nasal spray, Place 1 spray into both nostrils 2 (two) times daily as needed for rhinitis., Disp: 30 mL, Rfl: 5   Lancets (ONETOUCH DELICA PLUS LANCET33G) MISC, TEST BLOOD SUGAR ONCE A DAY, Disp: 100 each, Rfl: 1   loratadine (CLARITIN REDITABS) 10 MG dissolvable tablet, Take  10 mg by mouth daily., Disp: , Rfl:    montelukast  (SINGULAIR ) 10 MG tablet, TAKE 1 TABLET(10 MG) BY MOUTH AT BEDTIME, Disp: 90 tablet, Rfl: 1   Olopatadine  HCl 0.2 % SOLN, Place 1 drop in each eye once a day as needed for itchy watery eyes, Disp: 2.5 mL, Rfl: 5   omeprazole  (PRILOSEC) 40 MG capsule, Take 1 capsule (40 mg total) by mouth daily as needed (reflux)., Disp: 90 capsule, Rfl: 3   potassium chloride  SA (KLOR-CON  M) 20 MEQ tablet, TAKE 2 TABLETS BY MOUTH DAILY FOR 3 DAYS. THEN TAKE 1 TABLET BY MOUTH DAILY., Disp: 30 tablet, Rfl: 3   rosuvastatin  (CRESTOR ) 5 MG tablet, TAKE 1 TABLET(5 MG) BY MOUTH 2 TIMES A WEEK, Disp: 25 tablet, Rfl: 1   SYNTHROID  137 MCG tablet, Take 1 tablet (137 mcg total) by mouth daily before breakfast., Disp: 78 tablet, Rfl: 3   triamterene -hydrochlorothiazide  (MAXZIDE -25) 37.5-25 MG tablet, TAKE 1 TABLET BY MOUTH DAILY, Disp: 90 tablet, Rfl: 1   acetaminophen  (TYLENOL ) 325 MG tablet, Take 650 mg by mouth every 6 (six) hours as needed., Disp: , Rfl:    benzonatate  (TESSALON  PERLES) 100 MG capsule, Take 2 capsules (200 mg total) by mouth 3 (three) times daily as needed for cough., Disp: 30 capsule, Rfl: 0   diphenhydrAMINE  (BENADRYL ) 25 mg capsule, Take 2 capsules (50 mg total) by mouth once for 1 dose., Disp: , Rfl:    estradiol  (ESTRACE  VAGINAL) 0.1 MG/GM vaginal cream, Place 1 Applicatorful vaginally at bedtime. (Patient not taking: Reported on 05/29/2024), Disp: 42.5 g, Rfl: 12   meclizine  (ANTIVERT ) 25 MG tablet, Take 1 tablet (25 mg total) by mouth 2 (two) times daily as needed for up to 20 doses for dizziness., Disp: 20 tablet, Rfl: 0   metoCLOPramide  (REGLAN ) 10 MG tablet, Take 1 tablet (10 mg total) by mouth every 6 (six) hours., Disp: 30 tablet, Rfl: 0   ondansetron  (ZOFRAN ) 4 MG tablet, Take 1 tablet (4 mg total) by mouth every 8 (eight) hours as needed for nausea or vomiting., Disp: 20 tablet, Rfl: 0   predniSONE  (DELTASONE ) 50 MG tablet, 1 tab po 13 hours  prior to imaging then 1 tab po 7 hours prior to imaging and 1 tab po  1 hour prior to imaging, Disp: 3 tablet, Rfl: 0 Allergies  Allergen Reactions   Levaquin [Levofloxacin] Shortness Of Breath   Iodinated Contrast Media Itching   Clindamycin/Lincomycin Other (See Comments)    Pruritus No rash   Codeine Itching   Gabapentin  Itching     ROS: A complete ROS was performed with pertinent positives/negatives noted in the HPI. The remainder of the ROS are negative.    Objective:   Today's Vitals   05/29/24 1557  BP: 133/64  Pulse: 91  Temp: 97.8 F (36.6 C)  TempSrc: Temporal  SpO2: 98%  Weight: 205 lb 3.2 oz (93.1 kg)  Height: 5' 1 (1.549 m)    GENERAL: ill-appearing, non-toxic in NAD. Well nourished.  SKIN: Pink, warm and dry. No rash.  HEENT:    HEAD: Normocephalic, non-traumatic.  EYES: Conjunctive pink without exudate. PERRL.  EARS: External ear w/o redness, swelling, masses, or lesions. EAC clear. TM's intact, translucent w/o bulging, appropriate landmarks visualized.  NOSE: Septum midline w/o deformity. Nares patent, mucosa pink and non-inflamed w/o drainage. No sinus tenderness.  THROAT: Uvula midline. Oropharynx clear. Mucus membranes pink and moist.  NECK: Trachea midline. Full ROM w/o pain or tenderness. No lymphadenopathy.  RESPIRATORY: Chest wall symmetrical. Respirations even and non-labored. Expiratory wheezing throughout all lung fields. Rhonchi bilateral lower lung fields. (+) cough CARDIAC: S1, S2 present, regular rate and rhythm. Peripheral pulses 2+ bilaterally.  EXTREMITIES: Without clubbing, cyanosis, or edema.  NEUROLOGIC: Steady, even gait.  PSYCH/MENTAL STATUS: Alert, oriented x 3. Cooperative, appropriate mood and affect.    Results for orders placed or performed in visit on 05/29/24  POC COVID-19  Result Value Ref Range   SARS Coronavirus 2 Ag Negative Negative  POCT Influenza A/B  Result Value Ref Range   Influenza A, POC Negative Negative    Influenza B, POC Negative Negative      Assessment & Plan:  Assessment and Plan    Acute lower respiratory infection  - Ordered chest x-ray  - Discontinued cefdinir  due to itching reported - Prescribed Augmentin  875 mg/125 mg, one tablet twice daily for 7 days. - Continue prednisone  as prescribed. - Continue DuoNeb nebulizer treatments. - Continue dextromethorphan /guaifenesin  cough syrup. - Work note provided.  - Instructed to monitor SpO2 ( > 90%) and seek emergency care if symptoms worsen or shortness of breath increases.      Meds ordered this encounter  Medications   amoxicillin -clavulanate (AUGMENTIN ) 875-125 MG tablet    Sig: Take 1 tablet by mouth 2 (two) times daily for 7 days.    Dispense:  14 tablet    Refill:  0    Supervising Provider:   THOMPSON, AARON B [[id number]]   Orders Placed This Encounter  Procedures   DG Chest 2 View    Standing Status:   Future    Number of Occurrences:   1    Expiration Date:   11/26/2024    Reason for Exam (SYMPTOM  OR DIAGNOSIS REQUIRED):   cough, chest congestion . concern for pneumonia    Preferred imaging location?:   Internal   POC COVID-19   POCT Influenza A/B   Lab Orders         POC COVID-19         POCT Influenza A/B     No images are attached to the encounter or orders placed in the encounter.  Return if symptoms worsen or fail to improve.   Rosina Senters, FNP

## 2024-06-04 ENCOUNTER — Encounter: Payer: Self-pay | Admitting: Family Medicine

## 2024-06-04 ENCOUNTER — Ambulatory Visit (INDEPENDENT_AMBULATORY_CARE_PROVIDER_SITE_OTHER): Admitting: Family Medicine

## 2024-06-04 VITALS — BP 122/88 | HR 75 | Temp 98.6°F | Resp 20 | Wt 202.5 lb

## 2024-06-04 DIAGNOSIS — J069 Acute upper respiratory infection, unspecified: Secondary | ICD-10-CM | POA: Diagnosis not present

## 2024-06-04 DIAGNOSIS — J4541 Moderate persistent asthma with (acute) exacerbation: Secondary | ICD-10-CM | POA: Diagnosis not present

## 2024-06-04 DIAGNOSIS — R051 Acute cough: Secondary | ICD-10-CM | POA: Diagnosis not present

## 2024-06-04 MED ORDER — BUDESONIDE-FORMOTEROL FUMARATE 160-4.5 MCG/ACT IN AERO: 2.0000 | INHALATION_SPRAY | Freq: Two times a day (BID) | RESPIRATORY_TRACT | 3 refills | Status: DC

## 2024-06-04 MED ORDER — FLUTICASONE FUROATE-VILANTEROL 200-25 MCG/ACT IN AEPB: 1.0000 | INHALATION_SPRAY | Freq: Every day | RESPIRATORY_TRACT | 5 refills | Status: DC

## 2024-06-04 MED ORDER — BENZONATATE 100 MG PO CAPS: 200.0000 mg | ORAL_CAPSULE | Freq: Three times a day (TID) | ORAL | 0 refills | Status: DC | PRN

## 2024-06-04 MED ORDER — AZITHROMYCIN 250 MG PO TABS: ORAL_TABLET | ORAL | 0 refills | Status: DC

## 2024-06-04 MED ORDER — BENZONATATE 100 MG PO CAPS: 200.0000 mg | ORAL_CAPSULE | Freq: Three times a day (TID) | ORAL | 0 refills | Status: DC

## 2024-06-04 NOTE — Progress Notes (Unsigned)
 [address] [place] [address] Dept: [phone]  FOLLOW UP NOTE  Patient ID: Monica Wallace, female    DOB: March 13, 1951  Age: 73 y.o. MRN: [id number] Date of Office Visit: 06/04/2024  Assessment  Chief Complaint: Asthma, Cough, and URI  HPI MARCA Wallace is a 73 year old female who presents to the clinic for follow-up visit.  She was last seen in this clinic on 12/28/2023 by Dr. Marinda for evaluation of asthma, cough, allergic rhinitis, allergic conjunctivitis, and reflux.  Discussed the use of AI scribe software for clinical note transcription with the patient, who gave verbal consent to proceed.  History of Present Illness Monica Wallace is a 73 year old female with respiratory issues who presents with worsening cough and congestion.  She has been experiencing a persistent cough and congestion that have worsened over the last 2 weeks. Initially, she was prescribed prednisone  for five days and dextromethorphan /guaifenesin  which provided some relief. She was also prescribed cefdinir , but developed itching behind her neck, leading to discontinuation.  At that visit, a respiratory viral swab was negative.  Subsequently, she visited her primary care provider 05/29/2024 where she received was prescribed Augmentin , which also caused itching and a fine rash on her face, prompting her to stop the medication after five days.  Chest x-ray was negative on 05/29/2024.  Despite these treatments, she is not feeling better and experiencing increased congestion.  Current symptoms include weakness, persistent cough, and chest congestion, which is unusual for her as she typically experiences upper airway issues with wheezing. She continues to experience wheezing, which has worsened over time, and notes that her symptoms are not significantly different when lying down. Her symptoms occasionally wake her up at night, and her cough produces sputum that ranges from clear to green tinged. She also reports  sinus drainage, which was not present at the onset of her symptoms.  She denies significant shortness of breath but feels drained and notes chest soreness from coughing. Her appetite has decreased, and she recalls a low-grade fever of 99.66F at the onset of her illness, but no high fever.  She continues to take montelukast  (Singulair ) and has been unable to get Pulmicort  Flexhaler due to insurance formulary adjustments, currently without a daily inhaler.  She continues DuoNeb once or twice a day with moderate relief of symptoms.  She uses Flonase  daily and azelastine  as needed for allergy symptoms, and takes Claritin daily. No significant nasal congestion or sneezing, but reports clear nasal drainage and significant post-nasal drip.  She has a history of sinus obstruction, which was identified via CT scan and subsequently treated by Dr. Teo, whom she sees every six months.   She also has a history of reflux, managed with omeprazole  as needed and famotidine  daily. Reflux symptoms are typically triggered by late meals or certain foods, but she has not experienced significant reflux recently.  Her current medications are listed in the chart.  Chart review: EXAM: 2 VIEW(S) XRAY OF THE CHEST 05/29/2024 04:24:50 PM   COMPARISON: 11 / 1 / 23.   CLINICAL HISTORY: cough, chest congestion. concern for pneumonia   FINDINGS:   LUNGS AND PLEURA: Perihilar peribronchial wall thickening. No pleural effusion. No pneumothorax.   HEART AND MEDIASTINUM: No acute abnormality of the cardiac and mediastinal silhouettes.   BONES AND SOFT TISSUES: No acute osseous abnormality.   IMPRESSION: 1. Perihilar peribronchial wall thickening, which can be seen with reactive airways disease or bronchiolitis.   Electronically signed by:  Norman Gatlin MD 05/29/2024 05:12 PM EST RP Workstation: [id number]  Drug Allergies:  Allergies  Allergen Reactions   Levaquin [Levofloxacin] Shortness Of Breath    Iodinated Contrast Media Itching   Clindamycin/Lincomycin Other (See Comments)    Pruritus No rash   Codeine Itching   Gabapentin  Itching    Physical Exam: BP 122/88 (BP Location: Left Arm, Patient Position: Sitting, Cuff Size: Normal)   Pulse 75   Temp 98.6 F (37 C) (Oral)   Resp 20   Wt 202 lb 8 oz (91.9 kg)   SpO2 99%   BMI 38.26 kg/m    Physical Exam Vitals reviewed.  HENT:     Head: Normocephalic and atraumatic.     Right Ear: Tympanic membrane normal.     Left Ear: Tympanic membrane normal.     Nose:     Comments: Bilateral nares slightly erythematous with thin clear nasal drainage noted.  Pharynx slightly erythematous with no exudate.  Ears normal.  Eyes normal.    Mouth/Throat:     Pharynx: Oropharynx is clear.  Eyes:     Conjunctiva/sclera: Conjunctivae normal.  Cardiovascular:     Rate and Rhythm: Normal rate and regular rhythm.     Heart sounds: Normal heart sounds. No murmur heard. Pulmonary:     Effort: Pulmonary effort is normal.     Breath sounds: Normal breath sounds.     Comments: Lungs clear to auscultation Musculoskeletal:        General: Normal range of motion.     Cervical back: Normal range of motion and neck supple.  Skin:    General: Skin is warm and dry.  Neurological:     Mental Status: She is alert and oriented to person, place, and time.  Psychiatric:        Mood and Affect: Mood normal.        Behavior: Behavior normal.        Thought Content: Thought content normal.        Judgment: Judgment normal.     Diagnostics: FVC 1.97 which is 92% of predicted value, and 1.70 which is 102% of predicted value.  Spirometry indicates normal ventilatory function.   Assessment and Plan: 1. Not well controlled moderate persistent asthma with acute exacerbation   2. Upper respiratory tract infection, unspecified type   3. Acute cough       Meds ordered this encounter  Medications   DISCONTD: fluticasone  furoate-vilanterol (BREO ELLIPTA)  200-25 MCG/ACT AEPB    Sig: Inhale 1 puff into the lungs daily.    Dispense:  1 each    Refill:  5   DISCONTD: azithromycin  (ZITHROMAX  Z-PAK) 250 MG tablet    Sig: Take 2 tablets on the first day, then take 1 tablert once a day for the next 4 days, then stop    Dispense:  6 each    Refill:  0   DISCONTD: benzonatate  (TESSALON  PERLES) 100 MG capsule    Sig: Take 2 capsules (200 mg total) by mouth 3 (three) times daily as needed for cough.    Dispense:  30 capsule    Refill:  0   azithromycin  (ZITHROMAX ) 250 MG tablet    Sig: Take 2 tablets on the first day, then take 1 tablet once a day for the next 4 days    Dispense:  6 each    Refill:  0   benzonatate  (TESSALON ) 100 MG capsule    Sig: Take 2 capsules (200 mg total) by  mouth 3 (three) times daily.    Dispense:  20 capsule    Refill:  0   budesonide -formoterol (SYMBICORT) 160-4.5 MCG/ACT inhaler    Sig: Inhale 2 puffs into the lungs 2 (two) times daily. Rinse your mouth after each use    Dispense:  1 each    Refill:  3    Patient Instructions  Asthma Breo Ellipta 200-1 puff once a day to control asthma symptoms Continue montelukast  10 mg once a day to prevent cough or wheeze Continue albuterol  2 puffs once every 4 hours if needed for cough or wheeze You may use albuterol  2 puffs 5 to 15 minutes before activity to decrease cough or wheeze  Cough Begin azithromycin  250 mg tablets. Take 2 tablets on the first day, then take 1 tablet once a day for the next 4 days, then stop Begin Tessalon  Perles up to three times a day if needed for cough Begin Mucinex  [phone] mg twice a day and increase fluid intake as tolerated to thin mucus Continue with Flonase  and azelastine  as you have been Consider saline nasal rinses as needed for nasal symptoms. Use this before any medicated nasal sprays for best result  Allergic rhinitis Continue allergen avoidance measures directed toward grass pollen, tree pollen, mold, and dust mite as listed  below Continue Xyzal  5 mg once a day if needed for runny nose or itch Continue Flonase  2 sprays in each nostril once a day if needed for a stuffy nose Continue azelastine  2 sprays in each nostril up to twice a day if needed for nasal symptoms Continue Atrovent  2 sprays in each nostril up to 3 times a day if needed for runny nose Continue to follow-up with Dr. Karis, ENT specialist, for evaluation and treatment of recurrent sinusitis  Allergic conjunctivitis Continue olopatadine  1 drop in each eye once a day if needed for red or itchy eyes  Reflux Continue dietary and lifestyle modifications as listed below Continue omeprazole  40 mg once a day as previously prescribed Continue famotidine  20 mg once a day if needed for reflux Continue to follow-up with your GI specialist as recommended  Call the clinic if this treatment plan is not working well for you  Follow up in 1 month or sooner if needed.   Return in about 4 weeks (around 07/02/2024), or if symptoms worsen or fail to improve.    Thank you for the opportunity to care for this patient.  Please do not hesitate to contact me with questions.  Arlean Mutter, FNP [facility] 

## 2024-06-04 NOTE — Patient Instructions (Addendum)
 Asthma Breo Ellipta 200-1 puff once a day to control asthma symptoms Continue montelukast  10 mg once a day to prevent cough or wheeze Continue albuterol  2 puffs once every 4 hours if needed for cough or wheeze You may use albuterol  2 puffs 5 to 15 minutes before activity to decrease cough or wheeze  Cough Begin azithromycin  250 mg tablets. Take 2 tablets on the first day, then take 1 tablet once a day for the next 4 days, then stop Begin Tessalon  Perles up to three times a day if needed for cough Begin Mucinex  [phone] mg twice a day and increase fluid intake as tolerated to thin mucus Continue with Flonase  and azelastine  as you have been Consider saline nasal rinses as needed for nasal symptoms. Use this before any medicated nasal sprays for best result  Allergic rhinitis Continue allergen avoidance measures directed toward grass pollen, tree pollen, mold, and dust mite as listed below Continue Xyzal  5 mg once a day if needed for runny nose or itch Continue Flonase  2 sprays in each nostril once a day if needed for a stuffy nose Continue azelastine  2 sprays in each nostril up to twice a day if needed for nasal symptoms Continue Atrovent  2 sprays in each nostril up to 3 times a day if needed for runny nose Continue to follow-up with Dr. Karis, ENT specialist, for evaluation and treatment of recurrent sinusitis  Allergic conjunctivitis Continue olopatadine  1 drop in each eye once a day if needed for red or itchy eyes  Reflux Continue dietary and lifestyle modifications as listed below Continue omeprazole  40 mg once a day as previously prescribed Continue famotidine  20 mg once a day if needed for reflux Continue to follow-up with your GI specialist as recommended  Call the clinic if this treatment plan is not working well for you  Follow up in 1 month or sooner if needed.  CHRONIC THROAT CLEARING-WHAT TO DO!! Causes of chronic throat clearing may include the following (among others):   Acid reflux (lanyngopharyngeal reflux) Allergies (pollens, pet dander, dust mites, etc) Non allergic rhinitis (runny nose, post nasal drainage or congestion NOT caused by allergies-can be secondary to pollutants, cold air, changes in blood vessels to the nose as we age,etc) Environmental irritants (tobacco, smoke, air pollution) Asthma If present for a long period of time, throat clearing can become a habit. We will work with you to treat any of the medical reasons for throat clearing.  Your job is to help prevent the habit, which can cause damage (redness and swelling) to your vocal cords.  It will require a conscious effort on your behalf.   Tips for prevention of throat clearing:  Instead of clearing your throat, swallow instead.   Carrying around water (or something to drink) will help you move the mucus in the right direction.  IF you have the urge to clear your throat, drink your water. If you absolutely have to clear your throat, use a non-traumatic exercise to do so.   Pant with your mouth open saying "[place], [place], [place]" with a powerful, breathy voice. Increase water intake.  This thins secretions, making them easier to swallow. Chew baking soda gum ([employer]) which can help with swallowing, reflux, and throat clearing.  Try to chew up to three times daily.   If you experience jaw pain or headaches, decrease the amount of chewing. Suck on sugar free hard candy to help with swallowing. Have your friends and family remind you to swallow when  they hear you throat clearing.  As this can be habit forming, sometimes you may not realize you are doing this.  Having someone point it out to you, will help you become more conscious of the behavior. BE PATIENT.  This will take time to resolve, and some do not see improvement until 8-12 weeks into therapy/behavior modifications.    Reducing Pollen Exposure The American Academy of Allergy, Asthma and Immunology suggests the following steps to reduce  your exposure to pollen during allergy seasons. Do not hang sheets or clothing out to dry; pollen may collect on these items. Do not mow lawns or spend time around freshly cut grass; mowing stirs up pollen. Keep windows closed at night.  Keep car windows closed while driving. Minimize morning activities outdoors, a time when pollen counts are usually at their highest. Stay indoors as much as possible when pollen counts or humidity is high and on windy days when pollen tends to remain in the air longer. Use air conditioning when possible.  Many air conditioners have filters that trap the pollen spores. Use a HEPA room air filter to remove pollen form the indoor air you breathe.  Control of Mold Allergen Mold and fungi can grow on a variety of surfaces provided certain temperature and moisture conditions exist.  Outdoor molds grow on plants, decaying vegetation and soil.  The major outdoor mold, Alternaria and Cladosporium, are found in very high numbers during hot and dry conditions.  Generally, a late Summer - Fall peak is seen for common outdoor fungal spores.  Rain will temporarily lower outdoor mold spore count, but counts rise rapidly when the rainy period ends.  The most important indoor molds are Aspergillus and Penicillium.  Dark, humid and poorly ventilated basements are ideal sites for mold growth.  The next most common sites of mold growth are the bathroom and the kitchen.  Outdoor [employer] Use air conditioning and keep windows closed Avoid exposure to decaying vegetation. Avoid leaf raking. Avoid grain handling. Consider wearing a face mask if working in moldy areas.  Indoor Mold Control Maintain humidity below 50%. Clean washable surfaces with 5% bleach solution. Remove sources e.g. Contaminated carpets.   Control of Dust Mite Allergen Dust mites play a major role in allergic asthma and rhinitis. They occur in environments with high humidity wherever human skin is found.  Dust mites absorb humidity from the atmosphere (ie, they do not drink) and feed on organic matter (including shed human and animal skin). Dust mites are a microscopic type of insect that you cannot see with the naked eye. High levels of dust mites have been detected from mattresses, pillows, carpets, upholstered furniture, bed covers, clothes, soft toys and any woven material. The principal allergen of the dust mite is found in its feces. A gram of dust may contain 1,000 mites and 250,000 fecal particles. Mite antigen is easily measured in the air during house cleaning activities. Dust mites do not bite and do not cause harm to humans, other than by triggering allergies/asthma.  Ways to decrease your exposure to dust mites in your home:  1. Encase mattresses, box springs and pillows with a mite-impermeable barrier or cover  2. Wash sheets, blankets and drapes weekly in hot water (130 F) with detergent and dry them in a dryer on the hot setting.  3. Have the room cleaned frequently with a vacuum cleaner and a damp dust-mop. For carpeting or rugs, vacuuming with a vacuum cleaner equipped with a high-efficiency particulate  air (HEPA) filter. The dust mite allergic individual should not be in a room which is being cleaned and should wait 1 hour after cleaning before going into the room.  4. Do not sleep on upholstered furniture (eg, couches).  5. If possible removing carpeting, upholstered furniture and drapery from the home is ideal. Horizontal blinds should be eliminated in the rooms where the person spends the most time (bedroom, study, television room). Washable vinyl, roller-type shades are optimal.  6. Remove all non-washable stuffed toys from the bedroom. Wash stuffed toys weekly like sheets and blankets above.  7. Reduce indoor humidity to less than 50%. Inexpensive humidity monitors can be purchased at most hardware stores. Do not use a humidifier as can make the problem worse and are not  recommended.

## 2024-06-05 ENCOUNTER — Encounter: Payer: Self-pay | Admitting: Family Medicine

## 2024-06-06 ENCOUNTER — Ambulatory Visit (INDEPENDENT_AMBULATORY_CARE_PROVIDER_SITE_OTHER)

## 2024-06-06 VITALS — Ht 61.0 in | Wt 202.0 lb

## 2024-06-06 DIAGNOSIS — Z0000 Encounter for general adult medical examination without abnormal findings: Secondary | ICD-10-CM

## 2024-06-06 NOTE — Patient Instructions (Addendum)
 Ms. Dulski,  Thank you for taking the time for your Medicare Wellness Visit. I appreciate your continued commitment to your health goals. Please review the care plan we discussed, and feel free to reach out if I can assist you further.  Please note that Annual Wellness Visits do not include a physical exam. Some assessments may be limited, especially if the visit was conducted virtually. If needed, we may recommend an in-person follow-up with your provider.  Ongoing Care Seeing your primary care provider every 3 to 6 months helps us  monitor your health and provide consistent, personalized care.    Referrals If a referral was made during today's visit and you haven't received any updates within two weeks, please contact the referred provider directly to check on the status.  Recommended Screenings:  Health Maintenance  Topic Date Due   Zoster (Shingles) Vaccine (1 of 2) Never done   Complete foot exam   12/21/2023   Eye exam for diabetics  01/18/2024   Flu Shot  02/16/2024   Hemoglobin A1C  05/03/2024   DTaP/Tdap/Td vaccine (2 - Td or Tdap) 07/01/2024   Yearly kidney function blood test for diabetes  11/01/2024   Yearly kidney health urinalysis for diabetes  01/17/2025   Medicare Annual Wellness Visit  06/06/2025   Colon Cancer Screening  06/07/2025   Pneumococcal Vaccine for age over 59  Completed   Osteoporosis screening with Bone Density Scan  Completed   Hepatitis C Screening  Completed   Meningitis B Vaccine  Aged Out   Breast Cancer Screening  Discontinued   COVID-19 Vaccine  Discontinued       06/06/2024    3:56 PM  Advanced Directives  Does Patient Have a Medical Advance Directive? No  Would patient like information on creating a medical advance directive? No - Patient declined    Vision: Annual vision screenings are recommended for early detection of glaucoma, cataracts, and diabetic retinopathy. These exams can also reveal signs of chronic conditions such as  diabetes and high blood pressure.  Dental: Annual dental screenings help detect early signs of oral cancer, gum disease, and other conditions linked to overall health, including heart disease and diabetes.  Please see the attached documents for additional preventive care recommendations.

## 2024-06-06 NOTE — Progress Notes (Signed)
 Chief Complaint  Patient presents with   Medicare Wellness     Subjective:   Monica Wallace is a 73 y.o. female who presents for a Medicare Annual Wellness Visit.  Allergies (verified) Levaquin [levofloxacin], Iodinated contrast media, Clindamycin/lincomycin, Codeine, and Gabapentin    History: Past Medical History:  Diagnosis Date   Allergy History of food and drug   Arthritis    Asthma    Baker's cyst of knee, right 02/16/2017   Cancer (HCC) 2002   thyroid  (2002)   Cataract History surgery 2017   Cystic breast    Diabetes (HCC)    GERD (gastroesophageal reflux disease)    Hepatic cyst 02/02/2015   Hyperlipidemia    Hypertension    Insomnia 07/23/2016   Menopause    Migraine    Palpitations    Preventative health care 01/18/2016   Sleep apnea 02/02/2015   Thyroid  disease    Past Surgical History:  Procedure Laterality Date   ABDOMINAL HYSTERECTOMY     took right ovary and uterus   APPENDECTOMY     CARPAL TUNNEL RELEASE Right    CESAREAN SECTION     CHOLECYSTECTOMY     EYE SURGERY Bilateral 2017   cataracts   KNEE ARTHROPLASTY     SHOULDER ARTHROSCOPY W/ ROTATOR CUFF REPAIR Left    THYROIDECTOMY  2002   TUBAL LIGATION     Family History  Problem Relation Age of Onset   Arthritis Mother    Transient ischemic attack Mother    Hypertension Mother    Hyperlipidemia Mother    Diabetes Father    Heart disease Father    Arthritis Father    Kidney disease Father    Hypertension Father    Hyperlipidemia Father    Cancer Sister 59       pancreatic   Fibromyalgia Sister    Allergic rhinitis Sister    Asthma Maternal Grandfather        smoker   Depression Maternal Grandfather    Dementia Paternal Grandmother    Diabetes Paternal Grandfather    Hypertension Brother    Allergic rhinitis Brother    Scoliosis Daughter        had rods placed and removed   Arthritis Sister    Gout Sister    GER disease Sister    Cancer Sister        breast cancer    Breast cancer Sister 40   Social History   Occupational History   Not on file  Tobacco Use   Smoking status: Never   Smokeless tobacco: Never  Vaping Use   Vaping status: Never Used  Substance and Sexual Activity   Alcohol use: Yes    Comment: A few times a year   Drug use: No   Sexual activity: Yes    Birth control/protection: Post-menopausal, Surgical, Other-see comments    Comment:  boyfriend   Tobacco Counseling Counseling given: No  SDOH Screenings   Food Insecurity: No Food Insecurity (06/06/2024)  Housing: Unknown (06/06/2024)  Transportation Needs: No Transportation Needs (06/06/2024)  Utilities: Not At Risk (06/06/2024)  Alcohol Screen: Low Risk  (07/02/2023)  Depression (PHQ2-9): Low Risk  (06/06/2024)  Financial Resource Strain: Low Risk  (01/17/2024)  Physical Activity: Inactive (06/06/2024)  Social Connections: Moderately Integrated (06/06/2024)  Stress: No Stress Concern Present (06/06/2024)  Tobacco Use: Low Risk  (06/06/2024)  Health Literacy: Adequate Health Literacy (06/06/2024)   See flowsheets for full screening details  Depression Screen PHQ 2 &  9 Depression Scale- Over the past 2 weeks, how often have you been bothered by any of the following problems? Little interest or pleasure in doing things: 0 Feeling down, depressed, or hopeless (PHQ Adolescent also includes...irritable): 0 PHQ-2 Total Score: 0     Goals Addressed               This Visit's Progress     Increase physical activity (pt-stated)        Get more active       Visit info / Clinical Intake: Medicare Wellness Visit Type:: Subsequent Annual Wellness Visit Persons participating in visit:: patient Medicare Wellness Visit Mode:: Telephone If telephone:: video declined Because this visit was a virtual/telehealth visit:: pt reported vitals If Telephone or Video please confirm:: I connected with the patient using audio enabled telemedicine application and verified that I am  speaking with the correct person using two identifiers Patient Location:: Home Provider Location:: Office Information given by:: patient Interpreter Needed?: No Pre-visit prep was completed: no AWV questionnaire completed by patient prior to visit?: no Living arrangements:: with family/others Patient's Overall Health Status Rating: good Typical amount of pain: none Does pain affect daily life?: no Are you currently prescribed opioids?: no  Dietary Habits and Nutritional Risks How many meals a day?: 3 Eats fruit and vegetables daily?: yes Most meals are obtained by: preparing own meals In the last 2 weeks, have you had any of the following?: none Diabetic:: (!) yes Any non-healing wounds?: no How often do you check your BS?: as needed Would you like to be referred to a Nutritionist or for Diabetic Management? : no  Functional Status Activities of Daily Living (to include ambulation/medication): Independent Ambulation: Independent with device- listed below Home Assistive Devices/Equipment: Eyeglasses Medication Administration: Independent Home Management: Independent Manage your own finances?: yes Primary transportation is: driving Concerns about vision?: no *vision screening is required for WTM* Concerns about hearing?: no  Fall Screening Falls in the past year?: 0 Number of falls in past year: 0 Was there an injury with Fall?: 0 Fall Risk Category Calculator: 0 Patient Fall Risk Level: Low Fall Risk  Fall Risk Patient at Risk for Falls Due to: No Fall Risks  Home and Transportation Safety: All rugs have non-skid backing?: yes All stairs or steps have railings?: yes Grab bars in the bathtub or shower?: (!) no Have non-skid surface in bathtub or shower?: yes Good home lighting?: yes Regular seat belt use?: yes Hospital stays in the last year:: no  Cognitive Assessment Difficulty concentrating, remembering, or making decisions? : no Will 6CIT or Mini Cog be  Completed: no 6CIT or Mini Cog Declined: patient alert, oriented, able to answer questions appropriately and recall recent events  Advance Directives (For Healthcare) Does Patient Have a Medical Advance Directive?: No Would patient like information on creating a medical advance directive?: No - Patient declined  Reviewed/Updated  Reviewed/Updated: Reviewed All (Medical, Surgical, Family, Medications, Allergies, Care Teams, Patient Goals)        Objective:    Today's Vitals   06/06/24 1556  Weight: 202 lb (91.6 kg)  Height: 5' 1 (1.549 m)   Body mass index is 38.17 kg/m.  Current Medications (verified) Outpatient Encounter Medications as of 06/06/2024  Medication Sig   acetaminophen  (TYLENOL ) 325 MG tablet Take 650 mg by mouth every 6 (six) hours as needed.   albuterol  (PROVENTIL ) (2.5 MG/3ML) 0.083% nebulizer solution Take 3 mLs (2.5 mg total) by nebulization every 6 (six) hours as needed for  wheezing or shortness of breath.   albuterol  (VENTOLIN  HFA) 108 (90 Base) MCG/ACT inhaler Inhale 2 puffs into the lungs every 6 (six) hours as needed for wheezing or shortness of breath.   azelastine  (ASTELIN ) 0.1 % nasal spray USE 1 TO 2 SPRAYS IN EACH NOSTRIL TWICE DAILY FOR RUNNY NOSE   azithromycin  (ZITHROMAX ) 250 MG tablet Take 2 tablets on the first day, then take 1 tablet once a day for the next 4 days   benzonatate  (TESSALON ) 100 MG capsule Take 2 capsules (200 mg total) by mouth 3 (three) times daily.   Blood Glucose Monitoring Suppl (ONETOUCH VERIO FLEX SYSTEM) w/Device KIT Use to check blood glucose once a day.  DX code: E11.9   budesonide -formoterol (SYMBICORT) 160-4.5 MCG/ACT inhaler Inhale 2 puffs into the lungs 2 (two) times daily. Rinse your mouth after each use   Cholecalciferol ([employer] VIT D 5000 HIGH-POTENCY PO) Take 5,000 Units by mouth daily.   cyclobenzaprine  (FLEXERIL ) 5 MG tablet Take 1 tablet (5 mg total) by mouth 3 (three) times daily as needed for muscle spasms.    diphenhydrAMINE  (BENADRYL ) 25 mg capsule Take 2 capsules (50 mg total) by mouth once for 1 dose.   enalapril  (VASOTEC ) 2.5 MG tablet TAKE 1 TABLET(2.5 MG) BY MOUTH DAILY   estradiol  (ESTRACE  VAGINAL) 0.1 MG/GM vaginal cream Place 1 Applicatorful vaginally at bedtime. (Patient not taking: Reported on 05/29/2024)   famotidine  (PEPCID ) 40 MG tablet Take 1 tablet (40 mg total) by mouth at bedtime.   fluticasone  (FLONASE ) 50 MCG/ACT nasal spray SHAKE LIQUID AND USE 2 SPRAYS IN EACH NOSTRIL DAILY   furosemide  (LASIX ) 20 MG tablet TAKE 1 TABLET BY MOUTH DAILY AS NEEDED FOR SWELLING   glucose blood (ONETOUCH VERIO) test strip USE TO TEST BLOOD GLUCOSE ONCE DAILY AS DIRECTED   hyoscyamine  (LEVSIN  SL) 0.125 MG SL tablet Take 1 tablet (0.125 mg total) by mouth every 4 (four) hours as needed.   ipratropium (ATROVENT ) 0.03 % nasal spray Place 1 spray into both nostrils 2 (two) times daily as needed for rhinitis.   Lancets (ONETOUCH DELICA PLUS LANCET33G) MISC TEST BLOOD SUGAR ONCE A DAY   loratadine (CLARITIN REDITABS) 10 MG dissolvable tablet Take 10 mg by mouth daily.   meclizine  (ANTIVERT ) 25 MG tablet Take 1 tablet (25 mg total) by mouth 2 (two) times daily as needed for up to 20 doses for dizziness.   metoCLOPramide  (REGLAN ) 10 MG tablet Take 1 tablet (10 mg total) by mouth every 6 (six) hours.   montelukast  (SINGULAIR ) 10 MG tablet TAKE 1 TABLET(10 MG) BY MOUTH AT BEDTIME   Olopatadine  HCl 0.2 % SOLN Place 1 drop in each eye once a day as needed for itchy watery eyes   omeprazole  (PRILOSEC) 40 MG capsule Take 1 capsule (40 mg total) by mouth daily as needed (reflux).   ondansetron  (ZOFRAN ) 4 MG tablet Take 1 tablet (4 mg total) by mouth every 8 (eight) hours as needed for nausea or vomiting.   potassium chloride  SA (KLOR-CON  M) 20 MEQ tablet TAKE 2 TABLETS BY MOUTH DAILY FOR 3 DAYS. THEN TAKE 1 TABLET BY MOUTH DAILY.   predniSONE  (DELTASONE ) 50 MG tablet 1 tab po 13 hours prior to imaging then 1 tab po 7  hours prior to imaging and 1 tab po 1 hour prior to imaging   rosuvastatin  (CRESTOR ) 5 MG tablet TAKE 1 TABLET(5 MG) BY MOUTH 2 TIMES A WEEK   SYNTHROID  137 MCG tablet Take 1 tablet (137 mcg total)  by mouth daily before breakfast.   triamterene -hydrochlorothiazide  (MAXZIDE -25) 37.5-25 MG tablet TAKE 1 TABLET BY MOUTH DAILY   No facility-administered encounter medications on file as of 06/06/2024.   Hearing/Vision screen Hearing Screening - Comments:: Denies hearing difficulties   Vision Screening - Comments:: Wears rx glasses - up to date with routine eye exams with  Dr Daughtery Immunizations and Health Maintenance Health Maintenance  Topic Date Due   Zoster Vaccines- Shingrix (1 of 2) Never done   FOOT EXAM  12/21/2023   OPHTHALMOLOGY EXAM  01/18/2024   Influenza Vaccine  02/16/2024   HEMOGLOBIN A1C  05/03/2024   DTaP/Tdap/Td (2 - Td or Tdap) 07/01/2024   Diabetic kidney evaluation - eGFR measurement  11/01/2024   Diabetic kidney evaluation - Urine ACR  01/17/2025   Medicare Annual Wellness (AWV)  06/06/2025   Colonoscopy  06/07/2025   Pneumococcal Vaccine: 50+ Years  Completed   Bone Density Scan  Completed   Hepatitis C Screening  Completed   Meningococcal B Vaccine  Aged Out   Mammogram  Discontinued   COVID-19 Vaccine  Discontinued        Assessment/Plan:  This is a routine wellness examination for Monica Wallace.  Patient Care Team: Domenica Harlene LABOR, MD as PCP - General (Family Medicine) Kristie Lamprey, MD as Consulting Physician (Gastroenterology) [place], Aloysius Renny LABOR, MD (Inactive) as Consulting Physician (Pulmonary Disease) Lavonia Lye, MD as Consulting Physician (Ophthalmology) Karis Clunes, MD as Consulting Physician (Otolaryngology) Carla Milling, RPH-CPP (Pharmacist) [facility], Donell Cardinal, MD as Attending Physician (Endocrinology) Dermatology, [facility]  I have personally reviewed and noted the following in the patient's chart:    Medical and social history Use of alcohol, tobacco or illicit drugs  Current medications and supplements including opioid prescriptions. Functional ability and status Nutritional status Physical activity Advanced directives List of other physicians Hospitalizations, surgeries, and ER visits in previous 12 months Vitals Screenings to include cognitive, depression, and falls Referrals and appointments  No orders of the defined types were placed in this encounter.  In addition, I have reviewed and discussed with patient certain preventive protocols, quality metrics, and best practice recommendations. A written personalized care plan for preventive services as well as general preventive health recommendations were provided to patient.   Rojelio LELON Blush, LPN   [id number]    Return in 1 year on 06/13/25  After Visit Summary: (MyChart) Due to this being a telephonic visit, the after visit summary with patients personalized plan was offered to patient via MyChart   Nurse Notes: None

## 2024-06-09 NOTE — Assessment & Plan Note (Signed)
 Well controlled, no changes to meds. Encouraged heart healthy diet such as the DASH diet and exercise as tolerated.

## 2024-06-09 NOTE — Assessment & Plan Note (Signed)
 [id number] acceptable, minimize simple carbs. Increase exercise as tolerated. Continue current meds

## 2024-06-09 NOTE — Assessment & Plan Note (Signed)
 No recent exacerbation

## 2024-06-09 NOTE — Assessment & Plan Note (Signed)
 Encourage heart healthy diet such as MIND or DASH diet, increase exercise, avoid trans fats, simple carbohydrates and processed foods, consider a krill or fish or flaxseed oil cap daily. Tolerating Rosuvastatin

## 2024-06-09 NOTE — Assessment & Plan Note (Signed)
 Encouraged DASH or MIND diet, decrease po intake and increase exercise as tolerated. Needs 7-8 hours of sleep nightly. Avoid trans fats, eat small, frequent meals every 4-5 hours with lean proteins, complex carbs and healthy fats. Minimize simple carbs, high fat foods and processed foods Patient with a BMI of 38 and multiple comorbid associated conditions most notably hypertension

## 2024-06-09 NOTE — Assessment & Plan Note (Signed)
 Supplement and monitor

## 2024-06-10 ENCOUNTER — Encounter: Payer: Self-pay | Admitting: Family Medicine

## 2024-06-10 ENCOUNTER — Ambulatory Visit: Admitting: Family Medicine

## 2024-06-10 DIAGNOSIS — E1169 Type 2 diabetes mellitus with other specified complication: Secondary | ICD-10-CM

## 2024-06-10 DIAGNOSIS — E119 Type 2 diabetes mellitus without complications: Secondary | ICD-10-CM | POA: Diagnosis not present

## 2024-06-10 DIAGNOSIS — I10 Essential (primary) hypertension: Secondary | ICD-10-CM

## 2024-06-10 DIAGNOSIS — E559 Vitamin D deficiency, unspecified: Secondary | ICD-10-CM | POA: Diagnosis not present

## 2024-06-10 DIAGNOSIS — E890 Postprocedural hypothyroidism: Secondary | ICD-10-CM

## 2024-06-10 DIAGNOSIS — E7800 Pure hypercholesterolemia, unspecified: Secondary | ICD-10-CM | POA: Diagnosis not present

## 2024-06-10 DIAGNOSIS — Z6838 Body mass index (BMI) 38.0-38.9, adult: Secondary | ICD-10-CM | POA: Diagnosis not present

## 2024-06-10 DIAGNOSIS — J4530 Mild persistent asthma, uncomplicated: Secondary | ICD-10-CM | POA: Diagnosis not present

## 2024-06-10 DIAGNOSIS — E785 Hyperlipidemia, unspecified: Secondary | ICD-10-CM

## 2024-06-10 DIAGNOSIS — E669 Obesity, unspecified: Secondary | ICD-10-CM

## 2024-06-10 MED ORDER — AZITHROMYCIN 250 MG PO TABS: ORAL_TABLET | ORAL | 0 refills | Status: DC

## 2024-06-10 MED ORDER — ROSUVASTATIN CALCIUM 5 MG PO TABS: 5.0000 mg | ORAL_TABLET | ORAL | 3 refills | Status: AC

## 2024-06-10 MED ORDER — METHYLPREDNISOLONE 4 MG PO TABS: ORAL_TABLET | ORAL | 0 refills | Status: DC

## 2024-06-10 MED ORDER — FLUCONAZOLE 150 MG PO TABS
150.0000 mg | ORAL_TABLET | ORAL | 1 refills | Status: AC
Start: 2024-06-10 — End: ?

## 2024-06-10 MED ORDER — TRIAMTERENE-HCTZ 37.5-25 MG PO TABS: 1.0000 | ORAL_TABLET | Freq: Every day | ORAL | 3 refills | Status: AC

## 2024-06-10 NOTE — Patient Instructions (Addendum)
 Annual covid and flu vaccines Shingrix is the new shingles shot, 2 shots over 2-6 months, confirm coverage with insurance and document, then can return here for shots with nurse appt or at pharmacy  Tetanus due in mid December Prevnar 20 next year Plain mucinex  twice a day for next week Acute Bronchitis, Adult  Acute bronchitis is sudden inflammation of the main airways (bronchi) that come off the windpipe (trachea) in the lungs. The swelling causes the airways to get smaller and make more mucus than normal. This can make it hard to breathe and can cause coughing or noisy breathing (wheezing). Acute bronchitis may last several weeks. The cough may last longer. Allergies, asthma, and exposure to smoke may make the condition worse. What are the causes? This condition can be caused by germs and by substances that irritate the lungs, including: Cold and flu viruses. The most common cause of this condition is the virus that causes the common cold. Bacteria. This is less common. Breathing in substances that irritate the lungs, including: Smoke from cigarettes and other forms of tobacco. Dust and pollen. Fumes from household cleaning products, gases, or burned fuel. Indoor or outdoor air pollution. What increases the risk? The following factors may make you more likely to develop this condition: A weak body's defense system, also called the immune system. A condition that affects your lungs and breathing, such as asthma. What are the signs or symptoms? Common symptoms of this condition include: Coughing. This may bring up clear, yellow, or green mucus from your lungs (sputum). Wheezing. Runny or stuffy nose. Having too much mucus in your lungs (chest congestion). Shortness of breath. Aches and pains, including sore throat or chest. How is this diagnosed? This condition is usually diagnosed based on: Your symptoms and medical history. A physical exam. You may also have other tests,  including tests to rule out other conditions, such as pneumonia. These tests include: A test of lung function. Test of a mucus sample to look for the presence of bacteria. Tests to check the oxygen level in your blood. Blood tests. Chest X-ray. How is this treated? Most cases of acute bronchitis clear up over time without treatment. Your health care provider may recommend: Drinking more fluids to help thin your mucus so it is easier to cough up. Taking inhaled medicine (inhaler) to improve air flow in and out of your lungs. Using a vaporizer or a humidifier. These are machines that add water to the air to help you breathe better. Taking a medicine that thins mucus and clears congestion (expectorant). Taking a medicine that prevents or stops coughing (cough suppressant). It is not common to take an antibiotic medicine for this condition. Follow these instructions at home:  Take over-the-counter and prescription medicines only as told by your health care provider. Use an inhaler, vaporizer, or humidifier as told by your health care provider. Take two teaspoons (10 mL) of honey at bedtime to lessen coughing at night. Drink enough fluid to keep your urine pale yellow. Do not use any products that contain nicotine or tobacco. These products include cigarettes, chewing tobacco, and vaping devices, such as e-cigarettes. If you need help quitting, ask your health care provider. Get plenty of rest. Return to your normal activities as told by your health care provider. Ask your health care provider what activities are safe for you. Keep all follow-up visits. This is important. How is this prevented? To lower your risk of getting this condition again: Wash your hands often with  soap and water for at least 20 seconds. If soap and water are not available, use hand sanitizer. Avoid contact with people who have cold symptoms. Try not to touch your mouth, nose, or eyes with your hands. Avoid breathing  in smoke or chemical fumes. Breathing smoke or chemical fumes will make your condition worse. Get the flu shot every year. Contact a health care provider if: Your symptoms do not improve after 2 weeks. You have trouble coughing up the mucus. Your cough keeps you awake at night. You have a fever. Get help right away if you: Cough up blood. Feel pain in your chest. Have severe shortness of breath. Faint or keep feeling like you are going to faint. Have a severe headache. Have a fever or chills that get worse. These symptoms may represent a serious problem that is an emergency. Do not wait to see if the symptoms will go away. Get medical help right away. Call your local emergency services (911 in the [place] Do not drive yourself to the hospital. Summary Acute bronchitis is inflammation of the main airways (bronchi) that come off the windpipe (trachea) in the lungs. The swelling causes the airways to get smaller and make more mucus than normal. Drinking more fluids can help thin your mucus so it is easier to cough up. Take over-the-counter and prescription medicines only as told by your health care provider. Do not use any products that contain nicotine or tobacco. These products include cigarettes, chewing tobacco, and vaping devices, such as e-cigarettes. If you need help quitting, ask your health care provider. Contact a health care provider if your symptoms do not improve after 2 weeks. This information is not intended to replace advice given to you by your health care provider. Make sure you discuss any questions you have with your health care provider. Document Revised: 10/14/2021 Document Reviewed: 11/04/2020 [employer] Patient Education  2024 [employer].

## 2024-06-10 NOTE — Progress Notes (Addendum)
 "  Subjective:    Patient ID: Monica Wallace, female    DOB: 1950-10-26, 73 y.o.   MRN: [id number]  Chief Complaint  Patient presents with   Hospitalization Follow-up    Patient presents today for a 4 month follow-up.   Quality Metric Gaps    Awv, zoster, foot & eye exam.    HPI Discussed the use of AI scribe software for clinical note transcription with the patient, who gave verbal consent to proceed.  History of Present Illness Monica Wallace is a 73 year old female who presents with persistent respiratory symptoms following a viral illness.  She experienced a severe viral illness that began on a Friday night, leading to significant symptoms by Saturday, and prompting a visit to urgent care on Sunday. She was not tested for flu or COVID but was prescribed steroids and antibiotics. She experienced itching behind her neck after five days of cefdinir  and developed a rash with Augmentin , leading to discontinuation of these medications. She was later prescribed a Z-Pak, which she tolerated well, and it helped alleviate nasal congestion. She continues to experience a persistent cough and nasal drainage, although she feels she is gradually improving. She has been using Tessalon  Perles for the cough.  She has a history of allergies and visited an allergy clinic recently. She is concerned about the risk of respiratory infections, especially given her family's recent illnesses, including her husband and grandchildren.  She has a history of IBS, which tends to flare up with constipation when she is unwell. She has experienced some diarrhea-like symptoms with the Z-Pak but not severe diarrhea.  Her current medications include a multivitamin, rosuvastatin  5 mg twice a week, and triamterene .    Past Medical History:  Diagnosis Date   Allergy History of food and drug   Arthritis    Asthma    Baker's cyst of knee, right 02/16/2017   Cancer (HCC) 2002   thyroid  (2002)   Cataract History surgery  2017   Cystic breast    Diabetes (HCC)    GERD (gastroesophageal reflux disease)    Hepatic cyst 02/02/2015   Hyperlipidemia    Hypertension    Insomnia 07/23/2016   Menopause    Migraine    Palpitations    Preventative health care 01/18/2016   Sleep apnea 02/02/2015   Thyroid  disease     Past Surgical History:  Procedure Laterality Date   ABDOMINAL HYSTERECTOMY     took right ovary and uterus   APPENDECTOMY     CARPAL TUNNEL RELEASE Right    CESAREAN SECTION     CHOLECYSTECTOMY     EYE SURGERY Bilateral 2017   cataracts   KNEE ARTHROPLASTY     SHOULDER ARTHROSCOPY W/ ROTATOR CUFF REPAIR Left    THYROIDECTOMY  2002   TUBAL LIGATION      Family History  Problem Relation Age of Onset   Arthritis Mother    Transient ischemic attack Mother    Hypertension Mother    Hyperlipidemia Mother    Diabetes Father    Heart disease Father    Arthritis Father    Kidney disease Father    Hypertension Father    Hyperlipidemia Father    Cancer Sister 33       pancreatic   Fibromyalgia Sister    Allergic rhinitis Sister    Asthma Maternal Grandfather        smoker   Depression Maternal Grandfather    Dementia Paternal Grandmother  Diabetes Paternal Grandfather    Hypertension Brother    Allergic rhinitis Brother    Scoliosis Daughter        had rods placed and removed   Arthritis Sister    Gout Sister    GER disease Sister    Cancer Sister        breast cancer   Breast cancer Sister 43    Social History   Socioeconomic History   Marital status: Divorced    Spouse name: Not on file   Number of children: 2   Years of education: Not on file   Highest education level: Master's degree (e.g., MA, MS, MEng, MEd, MSW, MBA)  Occupational History   Not on file  Tobacco Use   Smoking status: Never   Smokeless tobacco: Never  Vaping Use   Vaping status: Never Used  Substance and Sexual Activity   Alcohol use: Yes    Comment: A few times a year   Drug use: No    Sexual activity: Yes    Birth control/protection: Post-menopausal, Surgical, Other-see comments    Comment:  boyfriend  Other Topics Concern   Not on file  Social History Narrative   Household: pt, daughter and sister    Social Drivers of Corporate Investment Banker Strain: Medium Risk (06/07/2024)   Overall Financial Resource Strain (CARDIA)    Difficulty of Paying Living Expenses: Somewhat hard  Food Insecurity: No Food Insecurity (06/07/2024)   Hunger Vital Sign    Worried About Running Out of Food in the Last Year: Never true    Ran Out of Food in the Last Year: Never true  Transportation Needs: No Transportation Needs (06/07/2024)   PRAPARE - Administrator, Civil Service (Medical): No    Lack of Transportation (Non-Medical): No  Physical Activity: Inactive (06/07/2024)   Exercise Vital Sign    Days of Exercise per Week: 0 days    Minutes of Exercise per Session: Not on file  Stress: No Stress Concern Present (06/07/2024)   [employer] of Occupational Health - Occupational Stress Questionnaire    Feeling of Stress: Only a little  Social Connections: Moderately Integrated (06/07/2024)   Social Connection and Isolation Panel    Frequency of Communication with Friends and Family: More than three times a week    Frequency of Social Gatherings with Friends and Family: More than three times a week    Attends Religious Services: More than 4 times per year    Active Member of [employer] or Organizations: Yes    Attends Engineer, Structural: More than 4 times per year    Marital Status: Divorced  Intimate Partner Violence: Not At Risk (06/06/2024)   Humiliation, Afraid, Rape, and Kick questionnaire    Fear of Current or Ex-Partner: No    Emotionally Abused: No    Physically Abused: No    Sexually Abused: No    Outpatient Medications Prior to Visit  Medication Sig Dispense Refill   acetaminophen  (TYLENOL ) 325 MG tablet Take 650 mg by mouth every 6 (six)  hours as needed.     albuterol  (PROVENTIL ) (2.5 MG/3ML) 0.083% nebulizer solution Take 3 mLs (2.5 mg total) by nebulization every 6 (six) hours as needed for wheezing or shortness of breath. 75 mL 0   albuterol  (VENTOLIN  HFA) 108 (90 Base) MCG/ACT inhaler Inhale 2 puffs into the lungs every 6 (six) hours as needed for wheezing or shortness of breath. 18 g 1   azelastine  (  ASTELIN ) 0.1 % nasal spray USE 1 TO 2 SPRAYS IN EACH NOSTRIL TWICE DAILY FOR RUNNY NOSE 30 mL 5   benzonatate  (TESSALON ) 100 MG capsule Take 2 capsules (200 mg total) by mouth 3 (three) times daily. 20 capsule 0   Blood Glucose Monitoring Suppl (ONETOUCH VERIO FLEX SYSTEM) w/Device KIT Use to check blood glucose once a day.  DX code: E11.9 1 kit 0   budesonide -formoterol  (SYMBICORT ) 160-4.5 MCG/ACT inhaler Inhale 2 puffs into the lungs 2 (two) times daily. Rinse your mouth after each use 1 each 3   Cholecalciferol ([employer] VIT D 5000 HIGH-POTENCY PO) Take 5,000 Units by mouth daily.     cyclobenzaprine  (FLEXERIL ) 5 MG tablet Take 1 tablet (5 mg total) by mouth 3 (three) times daily as needed for muscle spasms. 30 tablet 1   enalapril  (VASOTEC ) 2.5 MG tablet TAKE 1 TABLET(2.5 MG) BY MOUTH DAILY 90 tablet 1   famotidine  (PEPCID ) 40 MG tablet Take 1 tablet (40 mg total) by mouth at bedtime. 90 tablet 3   fluticasone  (FLONASE ) 50 MCG/ACT nasal spray SHAKE LIQUID AND USE 2 SPRAYS IN EACH NOSTRIL DAILY 48 g 5   furosemide  (LASIX ) 20 MG tablet TAKE 1 TABLET BY MOUTH DAILY AS NEEDED FOR SWELLING 90 tablet 0   glucose blood (ONETOUCH VERIO) test strip USE TO TEST BLOOD GLUCOSE ONCE DAILY AS DIRECTED 100 strip 1   hyoscyamine  (LEVSIN  SL) 0.125 MG SL tablet Take 1 tablet (0.125 mg total) by mouth every 4 (four) hours as needed. 30 tablet 2   ipratropium (ATROVENT ) 0.03 % nasal spray Place 1 spray into both nostrils 2 (two) times daily as needed for rhinitis. 30 mL 5   Lancets (ONETOUCH DELICA PLUS LANCET33G) MISC TEST BLOOD SUGAR ONCE A DAY 100  each 1   loratadine (CLARITIN REDITABS) 10 MG dissolvable tablet Take 10 mg by mouth daily.     meclizine  (ANTIVERT ) 25 MG tablet Take 1 tablet (25 mg total) by mouth 2 (two) times daily as needed for up to 20 doses for dizziness. 20 tablet 0   montelukast  (SINGULAIR ) 10 MG tablet TAKE 1 TABLET(10 MG) BY MOUTH AT BEDTIME 90 tablet 1   Olopatadine  HCl 0.2 % SOLN Place 1 drop in each eye once a day as needed for itchy watery eyes 2.5 mL 5   omeprazole  (PRILOSEC) 40 MG capsule Take 1 capsule (40 mg total) by mouth daily as needed (reflux). 90 capsule 3   ondansetron  (ZOFRAN ) 4 MG tablet Take 1 tablet (4 mg total) by mouth every 8 (eight) hours as needed for nausea or vomiting. 20 tablet 0   potassium chloride  SA (KLOR-CON  M) 20 MEQ tablet TAKE 2 TABLETS BY MOUTH DAILY FOR 3 DAYS. THEN TAKE 1 TABLET BY MOUTH DAILY. 30 tablet 3   SYNTHROID  137 MCG tablet Take 1 tablet (137 mcg total) by mouth daily before breakfast. 78 tablet 3   azithromycin  (ZITHROMAX ) 250 MG tablet Take 2 tablets on the first day, then take 1 tablet once a day for the next 4 days 6 each 0   rosuvastatin  (CRESTOR ) 5 MG tablet TAKE 1 TABLET(5 MG) BY MOUTH 2 TIMES A WEEK 25 tablet 1   triamterene -hydrochlorothiazide  (MAXZIDE -25) 37.5-25 MG tablet TAKE 1 TABLET BY MOUTH DAILY 90 tablet 1   diphenhydrAMINE  (BENADRYL ) 25 mg capsule Take 2 capsules (50 mg total) by mouth once for 1 dose.     estradiol  (ESTRACE  VAGINAL) 0.1 MG/GM vaginal cream Place 1 Applicatorful vaginally at bedtime. (Patient not  taking: Reported on 05/29/2024) 42.5 g 12   metoCLOPramide  (REGLAN ) 10 MG tablet Take 1 tablet (10 mg total) by mouth every 6 (six) hours. 30 tablet 0   predniSONE  (DELTASONE ) 50 MG tablet 1 tab po 13 hours prior to imaging then 1 tab po 7 hours prior to imaging and 1 tab po 1 hour prior to imaging 3 tablet 0   No facility-administered medications prior to visit.    Allergies  Allergen Reactions   Levaquin [Levofloxacin] Shortness Of Breath    Iodinated Contrast Media Itching   Clindamycin/Lincomycin Other (See Comments)    Pruritus No rash   Codeine Itching   Gabapentin  Itching    Review of Systems  Constitutional:  Negative for fever and malaise/fatigue.  HENT:  Positive for congestion.   Eyes:  Negative for blurred vision and discharge.  Respiratory:  Positive for cough, sputum production and wheezing. Negative for hemoptysis and shortness of breath.   Cardiovascular:  Negative for chest pain, palpitations and leg swelling.  Gastrointestinal:  Positive for diarrhea. Negative for abdominal pain, blood in stool and nausea.  Genitourinary:  Negative for dysuria and frequency.  Musculoskeletal:  Negative for falls.  Skin: Negative.  Negative for rash.  Neurological:  Negative for dizziness, loss of consciousness and headaches.  Endo/Heme/Allergies:  Negative for environmental allergies.  Psychiatric/Behavioral:  Negative for depression. The patient is not nervous/anxious.        Objective:    Physical Exam Constitutional:      General: She is not in acute distress.    Appearance: Normal appearance. She is well-developed. She is not toxic-appearing.  HENT:     Head: Normocephalic and atraumatic.     Right Ear: External ear normal.     Left Ear: External ear normal.     Nose: Nose normal.  Eyes:     General:        Right eye: No discharge.        Left eye: No discharge.     Conjunctiva/sclera: Conjunctivae normal.  Neck:     Thyroid : No thyromegaly.  Cardiovascular:     Rate and Rhythm: Normal rate and regular rhythm.     Heart sounds: Normal heart sounds. No murmur heard. Pulmonary:     Effort: Pulmonary effort is normal. No respiratory distress.     Breath sounds: Normal breath sounds.  Abdominal:     General: Bowel sounds are normal.     Palpations: Abdomen is soft.     Tenderness: There is no abdominal tenderness. There is no guarding.  Musculoskeletal:        General: Normal range of motion.      Cervical back: Neck supple.  Lymphadenopathy:     Cervical: No cervical adenopathy.  Skin:    General: Skin is warm and dry.  Neurological:     Mental Status: She is alert and oriented to person, place, and time.  Psychiatric:        Mood and Affect: Mood normal.        Behavior: Behavior normal.        Thought Content: Thought content normal.        Judgment: Judgment normal.     BP 134/82   Pulse 83   Temp 98.7 F (37.1 C)   Resp 16   Ht 5' 1 (1.549 m)   Wt 201 lb 6.4 oz (91.4 kg)   SpO2 97%   BMI 38.05 kg/m  Wt Readings from Last 3 Encounters:  06/10/24 201 lb 6.4 oz (91.4 kg)  06/06/24 202 lb (91.6 kg)  06/04/24 202 lb 8 oz (91.9 kg)    Diabetic Foot Exam - Simple   No data filed    Lab Results  Component Value Date   WBC 12.6 (H) 06/10/2024   HGB 13.4 06/10/2024   HCT 40.1 06/10/2024   PLT 309.0 06/10/2024   GLUCOSE 120 (H) 06/10/2024   CHOL 193 06/10/2024   TRIG 114.0 06/10/2024   HDL 61.00 06/10/2024   LDLDIRECT 105.0 12/06/2022   LDLCALC 109 (H) 06/10/2024   ALT 20 06/10/2024   AST 20 06/10/2024   NA 139 06/10/2024   K 3.5 06/10/2024   CL 99 06/10/2024   CREATININE 0.93 06/10/2024   BUN 19 06/10/2024   CO2 30 06/10/2024   TSH 1.09 06/10/2024   INR 1.0 06/18/2021   HGBA1C 6.3 06/10/2024   MICROALBUR 0.4 01/18/2024    Lab Results  Component Value Date   TSH 1.09 06/10/2024   Lab Results  Component Value Date   WBC 12.6 (H) 06/10/2024   HGB 13.4 06/10/2024   HCT 40.1 06/10/2024   MCV 90.7 06/10/2024   PLT 309.0 06/10/2024   Lab Results  Component Value Date   NA 139 06/10/2024   K 3.5 06/10/2024   CO2 30 06/10/2024   GLUCOSE 120 (H) 06/10/2024   BUN 19 06/10/2024   CREATININE 0.93 06/10/2024   BILITOT 0.2 06/10/2024   ALKPHOS 93 06/10/2024   AST 20 06/10/2024   ALT 20 06/10/2024   PROT 6.9 06/10/2024   ALBUMIN 4.1 06/10/2024   CALCIUM  9.8 06/10/2024   ANIONGAP 12 06/21/2023   EGFR 81 06/17/2021   GFR 60.21 06/10/2024    Lab Results  Component Value Date   CHOL 193 06/10/2024   Lab Results  Component Value Date   HDL 61.00 06/10/2024   Lab Results  Component Value Date   LDLCALC 109 (H) 06/10/2024   Lab Results  Component Value Date   TRIG 114.0 06/10/2024   Lab Results  Component Value Date   CHOLHDL 3 06/10/2024   Lab Results  Component Value Date   HGBA1C 6.3 06/10/2024       Assessment & Plan:  Morbid obesity (HCC) Assessment & Plan: Encouraged DASH or MIND diet, decrease po intake and increase exercise as tolerated. Needs 7-8 hours of sleep nightly. Avoid trans fats, eat small, frequent meals every 4-5 hours with lean proteins, complex carbs and healthy fats. Minimize simple carbs, high fat foods and processed foods Patient with a BMI of 38 and multiple comorbid associated conditions most notably hypertension   Mild persistent asthma without complication Assessment & Plan: No recent exacerbation   Postoperative hypothyroidism Assessment & Plan: Supplement and monitor   Pure hypercholesterolemia Assessment & Plan: Encourage heart healthy diet such as MIND or DASH diet, increase exercise, avoid trans fats, simple carbohydrates and processed foods, consider a krill or fish or flaxseed oil cap daily. Tolerating Rosuvastatin   Orders: -     TSH -     Lipid panel  Primary hypertension Assessment & Plan: Well controlled, no changes to meds. Encouraged heart healthy diet such as the DASH diet and exercise as tolerated.    Orders: -     Comprehensive metabolic panel with GFR -     CBC with Differential/Platelet  Controlled type 2 diabetes mellitus without complication, without long-term current use of insulin (HCC) Assessment & Plan: hgba1c acceptable, minimize simple carbs. Increase exercise as tolerated.  Continue current meds  Orders: -     Hemoglobin A1c  Vitamin D  deficiency -     VITAMIN D  25 Hydroxy (Vit-D Deficiency, Fractures)  Type 2 diabetes mellitus with  hyperlipidemia (HCC) Assessment & Plan: hgba1c acceptable, minimize simple carbs. Increase exercise as tolerated. Continue current meds   Other orders -     Azithromycin ; Take 2 tablets on the first day, then take 1 tablet once a day for the next 4 days  Dispense: 6 each; Refill: 0 -     Fluconazole ; Take 1 tablet (150 mg total) by mouth once a week.  Dispense: 2 tablet; Refill: 1 -     Rosuvastatin  Calcium ; Take 1 tablet (5 mg total) by mouth 2 (two) times a week.  Dispense: 25 tablet; Refill: 3 -     Triamterene -HCTZ; Take 1 tablet by mouth daily.  Dispense: 90 tablet; Refill: 3 -     methylPREDNISolone ; 5 tabs po x 3 day then 4 tabs po x 3 day then 3 tabs po x 3 day then 2 tabs po x 3 day then 1 tab po x 3 day and stop  Dispense: 45 tablet; Refill: 0    Assessment and Plan Assessment & Plan Acute respiratory infection with persistent cough Persistent cough and nasal drainage following a recent viral infection. Previous treatments included steroids, antibiotics (cefdinir , Augmentin , azithromycin ), and cough medications. Developed a rash with Augmentin  and itching with azithromycin . Cough gradually improving but still present. No fever currently. Discussed the importance of avoiding fever and worsening symptoms to ensure a good immune response to vaccines. - Refilled azithromycin  for potential second course if symptoms worsen. - Prescribed Medrol  Dosepak for a longer course of steroids to address persistent cough. - Recommended plain Mucinex  twice daily for the next week to help break up mucus. - Advised to send the name of the cough syrup via MyChart for a refill. - Encouraged hydration and rest. - Discussed the importance of flu and COVID vaccinations.  Bowel movement irregularities (constipation and irritable bowel syndrome) Constipation noted, especially when dehydrated. IBS flare-ups occur when sick. Discussed potential use of laxatives and suppositories for management. - Recommended  warm prune juice for constipation relief. - Suggested Miralax as a laxative option. - Advised use of dulcolax suppository if needed for constipation relief. - Discussed the use of saline Fleet enemas as an alternative.  Primary hypertension Hypertension management ongoing.  Type 2 diabetes mellitus without complication Diabetes management ongoing.  Pure hypercholesterolemia Hypercholesterolemia management ongoing. Recent issues with pharmacy dispensing rosuvastatin . - Sent prescription for rosuvastatin  to the new pharmacy.  Postoperative hypothyroidism Hypothyroidism management ongoing.  Sleep disturbances Sleep disturbances noted, possibly related to recent illness and cough.  Vitamin D  deficiency Vitamin D  supplementation ongoing.  General Health Maintenance Discussed the importance of vaccinations including flu, COVID, Prevnar 20, shingles, and tetanus. Prevnar 20 recommended next year due to new strains covered. Tetanus booster due in December. Shingles vaccine not yet received. - Recommended annual flu and COVID vaccinations. - Plan for Prevnar 20 vaccination next year. - Plan for shingles vaccination. - Plan for tetanus booster in December.  Recording duration: 22 minutes     Harlene Horton, MD "

## 2024-06-11 ENCOUNTER — Encounter: Payer: Self-pay | Admitting: Family Medicine

## 2024-06-11 LAB — LIPID PANEL
Cholesterol: 193 mg/dL (ref 0–200)
HDL: 61 mg/dL (ref >39.00)
LDL Cholesterol: 109 mg/dL — ABNORMAL HIGH (ref 0–99)
NonHDL: 131.86
Total CHOL/HDL Ratio: 3
Triglycerides: 114 mg/dL (ref 0.0–149.0)
VLDL: 22.8 mg/dL (ref 0.0–40.0)

## 2024-06-11 LAB — CBC WITH DIFFERENTIAL/PLATELET
Basophils Absolute: 0.1 K/uL (ref 0.0–0.1)
Basophils Relative: 0.6 % (ref 0.0–3.0)
Eosinophils Absolute: 0.1 K/uL (ref 0.0–0.7)
Eosinophils Relative: 1 % (ref 0.0–5.0)
HCT: 40.1 % (ref 36.0–46.0)
Hemoglobin: 13.4 g/dL (ref 12.0–15.0)
Lymphocytes Relative: 24.5 % (ref 12.0–46.0)
Lymphs Abs: 3.1 K/uL (ref 0.7–4.0)
MCHC: 33.3 g/dL (ref 30.0–36.0)
MCV: 90.7 fl (ref 78.0–100.0)
Monocytes Absolute: 0.9 K/uL (ref 0.1–1.0)
Monocytes Relative: 7 % (ref 3.0–12.0)
Neutro Abs: 8.4 K/uL — ABNORMAL HIGH (ref 1.4–7.7)
Neutrophils Relative %: 66.9 % (ref 43.0–77.0)
Platelets: 309 K/uL (ref 150.0–400.0)
RBC: 4.43 Mil/uL (ref 3.87–5.11)
RDW: 13.8 % (ref 11.5–15.5)
WBC: 12.6 K/uL — ABNORMAL HIGH (ref 4.0–10.5)

## 2024-06-11 LAB — VITAMIN D 25 HYDROXY (VIT D DEFICIENCY, FRACTURES): VITD: 29.53 ng/mL — ABNORMAL LOW (ref 30.00–100.00)

## 2024-06-11 LAB — COMPREHENSIVE METABOLIC PANEL WITH GFR
ALT: 20 U/L (ref 0–35)
AST: 20 U/L (ref 0–37)
Albumin: 4.1 g/dL (ref 3.5–5.2)
Alkaline Phosphatase: 93 U/L (ref 39–117)
BUN: 19 mg/dL (ref 6–23)
CO2: 30 meq/L (ref 19–32)
Calcium: 9.8 mg/dL (ref 8.4–10.5)
Chloride: 99 meq/L (ref 96–112)
Creatinine, Ser: 0.93 mg/dL (ref 0.40–1.20)
GFR: 60.21 mL/min (ref >60.00)
Glucose, Bld: 120 mg/dL — ABNORMAL HIGH (ref 70–99)
Potassium: 3.5 meq/L (ref 3.5–5.1)
Sodium: 139 meq/L (ref 135–145)
Total Bilirubin: 0.2 mg/dL (ref 0.2–1.2)
Total Protein: 6.9 g/dL (ref 6.0–8.3)

## 2024-06-11 LAB — HEMOGLOBIN A1C: Hgb A1c MFr Bld: 6.3 % (ref 4.6–6.5)

## 2024-06-11 LAB — TSH: TSH: 1.09 u[IU]/mL (ref 0.35–5.50)

## 2024-06-12 ENCOUNTER — Ambulatory Visit: Payer: Self-pay | Admitting: Family

## 2024-07-03 ENCOUNTER — Encounter: Payer: Self-pay | Admitting: Internal Medicine

## 2024-07-03 ENCOUNTER — Ambulatory Visit: Admitting: Internal Medicine

## 2024-07-03 VITALS — BP 122/74 | HR 82 | Temp 98.1°F | Resp 18

## 2024-07-03 DIAGNOSIS — K219 Gastro-esophageal reflux disease without esophagitis: Secondary | ICD-10-CM

## 2024-07-03 DIAGNOSIS — J4530 Mild persistent asthma, uncomplicated: Secondary | ICD-10-CM | POA: Diagnosis not present

## 2024-07-03 DIAGNOSIS — J3089 Other allergic rhinitis: Secondary | ICD-10-CM | POA: Diagnosis not present

## 2024-07-03 MED ORDER — OMEPRAZOLE 40 MG PO CPDR: 40.0000 mg | DELAYED_RELEASE_CAPSULE | Freq: Every day | ORAL | 3 refills | Status: AC | PRN

## 2024-07-03 MED ORDER — BUDESONIDE-FORMOTEROL FUMARATE 160-4.5 MCG/ACT IN AERO: 2.0000 | INHALATION_SPRAY | Freq: Every day | RESPIRATORY_TRACT | 5 refills | Status: AC

## 2024-07-03 MED ORDER — MONTELUKAST SODIUM 10 MG PO TABS: ORAL_TABLET | ORAL | 1 refills | Status: AC

## 2024-07-03 MED ORDER — AZELASTINE HCL 0.1 % NA SOLN: NASAL | 5 refills | Status: AC

## 2024-07-03 MED ORDER — IPRATROPIUM BROMIDE 0.03 % NA SOLN: 1.0000 | Freq: Two times a day (BID) | NASAL | 5 refills | Status: AC | PRN

## 2024-07-03 MED ORDER — FAMOTIDINE 40 MG PO TABS: 40.0000 mg | ORAL_TABLET | Freq: Every day | ORAL | 3 refills | Status: AC

## 2024-07-03 NOTE — Patient Instructions (Addendum)
 Asthma Symbicort  160 mcg-2 puff once a day to control asthma symptoms During respiratory illness/asthma flare: increase to 2 puffs twice a day for 1-2 weeks or until symptoms are improved.  Continue montelukast  10 mg once a day to prevent cough or wheeze Continue albuterol  2 puffs once every 4 hours if needed for cough or wheeze You may use albuterol  2 puffs 5 to 15 minutes before activity to decrease cough or wheeze  Allergic rhinitis Continue allergen avoidance measures directed toward grass pollen, tree pollen, mold, and dust mite as listed below Continue Xyzal  5 mg once a day if needed for runny nose or itch Continue Flonase  2 sprays in each nostril once a day if needed for a stuffy nose Continue azelastine  2 sprays in each nostril up to twice a day if needed for nasal symptoms Continue Atrovent  2 sprays in each nostril up to 3 times a day if needed for runny nose Continue to follow-up with Dr. Karis, ENT specialist, for evaluation and treatment of recurrent sinusitis  Allergic conjunctivitis Continue olopatadine  1 drop in each eye once a day if needed for red or itchy eyes  Reflux Continue dietary and lifestyle modifications as listed below Continue omeprazole  40 mg once a day as needed as previously prescribed Continue famotidine  20 mg once a day as needed for reflux Continue to follow-up with your GI specialist as recommended  Call the clinic if this treatment plan is not working well for you  Follow up in 6 months or sooner if needed.  CHRONIC THROAT CLEARING-WHAT TO DO!! Causes of chronic throat clearing may include the following (among others):  Acid reflux (lanyngopharyngeal reflux) Allergies (pollens, pet dander, dust mites, etc) Non allergic rhinitis (runny nose, post nasal drainage or congestion NOT caused by allergies-can be secondary to pollutants, cold air, changes in blood vessels to the nose as we age,etc) Environmental irritants (tobacco, smoke, air  pollution) Asthma If present for a long period of time, throat clearing can become a habit. We will work with you to treat any of the medical reasons for throat clearing.  Your job is to help prevent the habit, which can cause damage (redness and swelling) to your vocal cords.  It will require a conscious effort on your behalf.   Tips for prevention of throat clearing:  Instead of clearing your throat, swallow instead.   Carrying around water (or something to drink) will help you move the mucus in the right direction.  IF you have the urge to clear your throat, drink your water. If you absolutely have to clear your throat, use a non-traumatic exercise to do so.   Pant with your mouth open saying [place], [place], [place] with a powerful, breathy voice. Increase water intake.  This thins secretions, making them easier to swallow. Chew baking soda gum ([employer]) which can help with swallowing, reflux, and throat clearing.  Try to chew up to three times daily.   If you experience jaw pain or headaches, decrease the amount of chewing. Suck on sugar free hard candy to help with swallowing. Have your friends and family remind you to swallow when they hear you throat clearing.  As this can be habit forming, sometimes you may not realize you are doing this.  Having someone point it out to you, will help you become more conscious of the behavior. BE PATIENT.  This will take time to resolve, and some do not see improvement until 8-12 weeks into therapy/behavior modifications.    Reducing  Pollen Exposure The American Academy of Allergy, Asthma and Immunology suggests the following steps to reduce your exposure to pollen during allergy seasons. Do not hang sheets or clothing out to dry; pollen may collect on these items. Do not mow lawns or spend time around freshly cut grass; mowing stirs up pollen. Keep windows closed at night.  Keep car windows closed while driving. Minimize morning activities outdoors, a  time when pollen counts are usually at their highest. Stay indoors as much as possible when pollen counts or humidity is high and on windy days when pollen tends to remain in the air longer. Use air conditioning when possible.  Many air conditioners have filters that trap the pollen spores. Use a HEPA room air filter to remove pollen form the indoor air you breathe.  Control of Mold Allergen Mold and fungi can grow on a variety of surfaces provided certain temperature and moisture conditions exist.  Outdoor molds grow on plants, decaying vegetation and soil.  The major outdoor mold, Alternaria and Cladosporium, are found in very high numbers during hot and dry conditions.  Generally, a late Summer - Fall peak is seen for common outdoor fungal spores.  Rain will temporarily lower outdoor mold spore count, but counts rise rapidly when the rainy period ends.  The most important indoor molds are Aspergillus and Penicillium.  Dark, humid and poorly ventilated basements are ideal sites for mold growth.  The next most common sites of mold growth are the bathroom and the kitchen.  Outdoor [employer] Use air conditioning and keep windows closed Avoid exposure to decaying vegetation. Avoid leaf raking. Avoid grain handling. Consider wearing a face mask if working in moldy areas.  Indoor Mold Control Maintain humidity below 50%. Clean washable surfaces with 5% bleach solution. Remove sources e.g. Contaminated carpets.   Control of Dust Mite Allergen Dust mites play a major role in allergic asthma and rhinitis. They occur in environments with high humidity wherever human skin is found. Dust mites absorb humidity from the atmosphere (ie, they do not drink) and feed on organic matter (including shed human and animal skin). Dust mites are a microscopic type of insect that you cannot see with the naked eye. High levels of dust mites have been detected from mattresses, pillows, carpets, upholstered furniture,  bed covers, clothes, soft toys and any woven material. The principal allergen of the dust mite is found in its feces. A gram of dust may contain 1,000 mites and 250,000 fecal particles. Mite antigen is easily measured in the air during house cleaning activities. Dust mites do not bite and do not cause harm to humans, other than by triggering allergies/asthma.  Ways to decrease your exposure to dust mites in your home:  1. Encase mattresses, box springs and pillows with a mite-impermeable barrier or cover  2. Wash sheets, blankets and drapes weekly in hot water (130 F) with detergent and dry them in a dryer on the hot setting.  3. Have the room cleaned frequently with a vacuum cleaner and a damp dust-mop. For carpeting or rugs, vacuuming with a vacuum cleaner equipped with a high-efficiency particulate air (HEPA) filter. The dust mite allergic individual should not be in a room which is being cleaned and should wait 1 hour after cleaning before going into the room.  4. Do not sleep on upholstered furniture (eg, couches).  5. If possible removing carpeting, upholstered furniture and drapery from the home is ideal. Horizontal blinds should be eliminated in the rooms  where the person spends the most time (bedroom, study, television room). Washable vinyl, roller-type shades are optimal.  6. Remove all non-washable stuffed toys from the bedroom. Wash stuffed toys weekly like sheets and blankets above.  7. Reduce indoor humidity to less than 50%. Inexpensive humidity monitors can be purchased at most hardware stores. Do not use a humidifier as can make the problem worse and are not recommended.

## 2024-07-03 NOTE — Progress Notes (Signed)
 FOLLOW UP Date of Service/Encounter:   07/03/2024  Subjective:  Monica Wallace (DOB: February 07, 1951) is a 73 y.o. female who [facility] in re-evaluation of the following: asthma, allergic rhinitis History obtained from: chart review and patient.  For Review, LV was on 06/04/24  with Monica Mutter, FNP seen for acute visit for cough and congestion. See below for summary of history and diagnostics.   Therapeutic plans/changes recommended: FEV1 102%, prescribed a z-pack, tessalon  pears, and mucinex .  ----------------------------------------------------- Pertinent History/Diagnostics:  Asthma: Restrictive lung pattern. Follows with pulmonary.  Controlled on pulmicort  flexhaler 180 mcg 2 puffs daily, Singulair  10 mg. Allergic Rhinitis:  Previous sinus surgery with Dr.Teoh in June 2022. Has reflux following with GI. Has hx of ETD. - SPT environmental panel (2021): intradermal's mildly reactive to grass pollen, tree pollen, mold mix #4, and dust mite  Current Meds: Atrovent  nasal spray as needed, Flonase  daily, azelastine  nasal spray as needed, Pataday  eyedrops, Xyzal . Follows with Dr. Karis ENT for recurrent sinusitis. Other: Has reflux on omeprazole  40 mg daily, follows with GI --------------------------------------------------- Today presents for follow-up. Discussed the use of AI scribe software for clinical note transcription with the patient, who gave verbal consent to proceed.  History of Present Illness Monica Wallace is a 73 year old female who presents for follow-up of her respiratory health and medication management.  Respiratory symptoms - Breathing has improved since last visit, when she experienced severe lung congestion. - No recent need for Zithromax  or Tessalon  [place]; uses Tessalon  occasionally for postnasal drip related cough. - No use of rescue inhaler (albuterol ) since symptoms cleared.  Inhaled medication management - Currently  taking Symbicort  once daily, changed due to insurance formulary. - Initially used Symbicort  twice daily during a respiratory infection, now reduced to once daily as symptoms improved.  Upper airway symptoms - Persistent postnasal drip. - Continues to use nasal sprays: Flonase , Atrovent , and azelastine . - Nasal sprays help alleviate congestion, especially on the left side. - Experiences pressure and tearing in the left eye upon waking. - Sleeps on right side, but symptoms occur on the left. - plans to see Dr. Karis in the spring  Gastroesophageal reflux symptoms - Acid reflux managed with omeprazole  and famotidine  (Pepcid ). - Takes Pepcid  almost every evening and omeprazole  as needed. - Appetite varies; some evenings does not eat much.   All medications reviewed by clinical staff and updated in chart. No new pertinent medical or surgical history except as noted in HPI.  ROS: All others negative except as noted per HPI.   Objective:  BP 122/74   Pulse 82   Temp 98.1 F (36.7 C)   Resp 18   SpO2 97%  There is no height or weight on file to calculate BMI. Physical Exam: General Appearance:  Alert, cooperative, no distress, appears stated age  Head:  Normocephalic, without obvious abnormality, atraumatic  Eyes:  Conjunctiva clear, EOM's intact  Ears EACs normal bilaterally and normal TMs bilaterally  Nose: Nares normal, hypertrophic turbinates, normal mucosa, and no visible anterior polyps  Throat: Lips, tongue normal; teeth and gums normal, normal posterior oropharynx  Neck: Supple, symmetrical  Lungs:   clear to auscultation bilaterally, Respirations unlabored, no coughing  Heart:  regular rate and rhythm and no murmur, Appears well perfused  Extremities: No edema  Skin: Skin color, texture, turgor normal and no rashes or lesions on visualized portions of skin  Neurologic: No gross deficits   Labs:  Lab Orders  No laboratory test(s) ordered today    Assessment/Plan   Asthma-at goal Spirometry due at follow-up Symbicort  160 mcg-2 puff once a day to control asthma symptoms During respiratory illness/asthma flare: increase to 2 puffs twice a day for 1-2 weeks or until symptoms are improved.  Continue montelukast  10 mg once a day to prevent cough or wheeze Continue albuterol  2 puffs once every 4 hours if needed for cough or wheeze You may use albuterol  2 puffs 5 to 15 minutes before activity to decrease cough or wheeze  Allergic rhinitis-at goal Continue allergen avoidance measures directed toward grass pollen, tree pollen, mold, and dust mite as listed below Continue Xyzal  5 mg once a day if needed for runny nose or itch Continue Flonase  2 sprays in each nostril once a day if needed for a stuffy nose Continue azelastine  2 sprays in each nostril up to twice a day if needed for nasal symptoms Continue Atrovent  2 sprays in each nostril up to 3 times a day if needed for runny nose Continue to follow-up with Dr. Karis, ENT specialist, for evaluation and treatment of recurrent sinusitis  Allergic conjunctivitis-at goal Continue olopatadine  1 drop in each eye once a day if needed for red or itchy eyes  Reflux-at goal Continue dietary and lifestyle modifications as listed below Continue omeprazole  40 mg once a day as needed as previously prescribed Continue famotidine  20 mg once a day as needed for reflux Continue to follow-up with your GI specialist as recommended  Call the clinic if this treatment plan is not working well for you  Follow up in 6 months or sooner if needed.  Other: none  Rocky Endow, MD  [facility] 

## 2024-08-14 ENCOUNTER — Ambulatory Visit: Admitting: Internal Medicine

## 2024-08-14 ENCOUNTER — Ambulatory Visit: Payer: Self-pay

## 2024-08-14 ENCOUNTER — Encounter: Payer: Self-pay | Admitting: Internal Medicine

## 2024-08-14 DIAGNOSIS — M545 Low back pain, unspecified: Secondary | ICD-10-CM | POA: Diagnosis not present

## 2024-08-14 MED ORDER — PREDNISONE 10 MG PO TABS: ORAL_TABLET | ORAL | 0 refills | Status: AC

## 2024-08-14 MED ORDER — CYCLOBENZAPRINE HCL 5 MG PO TABS: 5.0000 mg | ORAL_TABLET | Freq: Three times a day (TID) | ORAL | 0 refills | Status: AC | PRN

## 2024-08-14 NOTE — Progress Notes (Unsigned)
 "  Subjective:    Patient ID: Monica Wallace, female    DOB: [birth year], 74 y.o.   MRN: [id number]  DOS:  08/14/2024 Acute visit  Discussed the use of AI scribe software for clinical note transcription with the patient, who gave verbal consent to proceed.  History of Present Illness Monica Wallace is a 74 year old female who presents with worsening lower back pain.  She has persistent lower back pain just above the coccyx radiating to the left hip, with marked worsening over the past week that now limits daily activities and work. Bending, lifting, twisting, and movement worsen the pain. She uses Tylenol , a heating pad, and stretching exercises, which now provide less relief. An MRI in 2023 showed no significant pathology. She denies recent trauma, falls, back surgery, leg numbness, fever, chills, dysuria, hematuria, or abdominal pain. She has mild urinary leakage that she attributes to age. Current relevant medications include Tylenol  for pain and Flexeril  as needed for muscle spasms. She has thyroid  cancer, diabetes, hyperlipidemia, sleep apnea, cervical spinal stenosis, and prior torn meniscus surgery.    Review of Systems See above   Past Medical History:  Diagnosis Date   Allergy History of food and drug   Arthritis    Asthma    Baker's cyst of knee, right 02/16/2017   Cancer (HCC) 2002   thyroid  (2002)   Cataract History surgery 2017   Cystic breast    Diabetes (HCC)    GERD (gastroesophageal reflux disease)    Hepatic cyst 02/02/2015   Hyperlipidemia    Hypertension    Insomnia 07/23/2016   Menopause    Migraine    Palpitations    Preventative health care 01/18/2016   Sleep apnea 02/02/2015   Thyroid  disease     Past Surgical History:  Procedure Laterality Date   ABDOMINAL HYSTERECTOMY     took right ovary and uterus   APPENDECTOMY     CARPAL TUNNEL RELEASE Right    CESAREAN SECTION     CHOLECYSTECTOMY     EYE SURGERY Bilateral 2017   cataracts   KNEE  ARTHROPLASTY     SHOULDER ARTHROSCOPY W/ ROTATOR CUFF REPAIR Left    THYROIDECTOMY  2002   TUBAL LIGATION      Current Outpatient Medications  Medication Instructions   acetaminophen  (TYLENOL ) 650 mg, Every 6 hours PRN   albuterol  (PROVENTIL ) 2.5 mg, Nebulization, Every 6 hours PRN   albuterol  (VENTOLIN  HFA) 108 (90 Base) MCG/ACT inhaler 2 puffs, Inhalation, Every 6 hours PRN   azelastine  (ASTELIN ) 0.1 % nasal spray USE 1 TO 2 SPRAYS IN EACH NOSTRIL TWICE DAILY FOR RUNNY NOSE   benzonatate  (TESSALON ) 200 mg, Oral, 3 times daily   Blood Glucose Monitoring Suppl (ONETOUCH VERIO FLEX SYSTEM) w/Device KIT Use to check blood glucose once a day.  DX code: E11.9   budesonide -formoterol  (SYMBICORT ) 160-4.5 MCG/ACT inhaler 2 puffs, Inhalation, Daily, At onset of respiratory illness/asthma flare: Increase to 2 puffs twice daily with spacer for 1-2 weeks or until symptoms resolve.   Cholecalciferol ([employer] VIT D 5000 HIGH-POTENCY PO) 5,000 Units, Daily   cyclobenzaprine  (FLEXERIL ) 5 mg, Oral, 3 times daily PRN   enalapril  (VASOTEC ) 2.5 MG tablet TAKE 1 TABLET(2.5 MG) BY MOUTH DAILY   famotidine  (PEPCID ) 40 mg, Oral, Daily at bedtime   fluconazole  (DIFLUCAN ) 150 mg, Oral, Weekly   fluticasone  (FLONASE ) 50 MCG/ACT nasal spray SHAKE LIQUID AND USE 2 SPRAYS IN EACH NOSTRIL DAILY   furosemide  (LASIX ) 20 MG  tablet TAKE 1 TABLET BY MOUTH DAILY AS NEEDED FOR SWELLING   glucose blood (ONETOUCH VERIO) test strip USE TO TEST BLOOD GLUCOSE ONCE DAILY AS DIRECTED   hyoscyamine  (LEVSIN  SL) 0.125 mg, Oral, Every 4 hours PRN   ipratropium (ATROVENT ) 0.03 % nasal spray 1 spray, Each Nare, 2 times daily PRN   Lancets (ONETOUCH DELICA PLUS LANCET33G) MISC TEST BLOOD SUGAR ONCE A DAY   loratadine (CLARITIN REDITABS) 10 mg, Daily   meclizine  (ANTIVERT ) 25 mg, Oral, 2 times daily PRN   methylPREDNISolone  (MEDROL ) 4 MG tablet 5 tabs po x 3 day then 4 tabs po x 3 day then 3 tabs po x 3 day then 2 tabs po x 3 day then 1 tab po  x 3 day and stop   montelukast  (SINGULAIR ) 10 MG tablet TAKE 1 TABLET(10 MG) BY MOUTH AT BEDTIME   Olopatadine  HCl 0.2 % SOLN Place 1 drop in each eye once a day as needed for itchy watery eyes   omeprazole  (PRILOSEC) 40 mg, Oral, Daily PRN   ondansetron  (ZOFRAN ) 4 mg, Oral, Every 8 hours PRN   potassium chloride  SA (KLOR-CON  M) 20 MEQ tablet TAKE 2 TABLETS BY MOUTH DAILY FOR 3 DAYS. THEN TAKE 1 TABLET BY MOUTH DAILY.   rosuvastatin  (CRESTOR ) 5 mg, Oral, 2 times weekly   Synthroid  137 mcg, Oral, Daily before breakfast   triamterene -hydrochlorothiazide  (MAXZIDE -25) 37.5-25 MG tablet 1 tablet, Oral, Daily       Objective:   Physical Exam BP 126/72   Pulse 79   Temp 98.1 F (36.7 C) (Oral)   Resp 16   Ht 5' 1 (1.549 m)   Wt 204 lb 4 oz (92.6 kg)   SpO2 98%   BMI 38.59 kg/m  General:   Well developed, NAD, BMI noted. HEENT:  Normocephalic . Face symmetric, atraumatic MSK: Slightly TTP at the lumbar spine.  Nontender at the SI joints.  Lower extremities: no pretibial edema bilaterally  Skin: Not pale. Not jaundice Neurologic:  alert & oriented X3.  Speech normal, gait appropriate for age and unassisted.  Transfer slightly limited by BMI MSK issues Motor symmetric, DTR symmetric.  Straight leg test negative Psych--  Cognition and judgment appear intact.  Cooperative with normal attention span and concentration.  Behavior appropriate. No anxious or depressed appearing.      Assessment   74 year old female, PMH includes thyroid  cancer 2002, diabetes, high cholesterol, sleep apnea, HTN, morbid obesity, erosive arthritis, seen by rheumatology, at some point was offered Plaquenil.   Assessment & Plan Lumbalgia with acute exacerbation Chronic low back pain likely due to lumbar osteoarthritis, exacerbated over the past week. No signs of a radiculopathy. - Continue Tylenol  for pain management. - Avoid ibuprofen due to gastrointestinal upset. - Prescribed prednisone  to reduce  inflammation. - Use heating pad for symptomatic relief. - Take Flexeril  as needed for muscle relaxation. - Consider referral to orthopedic specialist if no improvement.  Type 2 diabetes mellitus Type 2 diabetes mellitus. Last A1c was 6.3. Prednisone  may increase blood sugar levels. - Monitor blood sugar levels closely during prednisone  treatment. - Report if blood sugar levels exceed 140 mg/dL.   Abdominal MRI 11/2023: No MSK findings "

## 2024-08-14 NOTE — Patient Instructions (Signed)
 Back pain: You have chronic low back pain likely due to lumbar osteoarthritis, which has worsened over the past week. -Continue taking Tylenol  for pain management. -Avoid using ibuprofen due to gastrointestinal side effects. -Start taking prednisone  to reduce inflammation.  Prescription sent -Use a heating pad for symptomatic relief. -Take Flexeril  as needed for muscle relaxation. - If not improving, let us  know.  We can refer you to a orthopedic doctor. - Prednisone  will increase your blood sugar.  Monitory daily for few days.  If your blood sugar goes over 140 let us  know

## 2024-08-14 NOTE — Telephone Encounter (Signed)
 FYI Only or Action Required?: FYI only for provider: appointment scheduled on 08/14/24.  Patient was last seen in primary care on 06/10/2024 by Domenica Harlene LABOR, MD.  Called Nurse Triage reporting Back Pain.  Symptoms began several weeks ago.  Interventions attempted: OTC medications: Acetaminophen .  Symptoms are: gradually worsening.  Triage Disposition: See PCP When Office is Open (Within 3 Days)  Patient/caregiver understands and will follow disposition?: Yes   Reason for Disposition  [1] MODERATE back pain (e.g., interferes with normal activities) AND [2] present > 3 days  Answer Assessment - Initial Assessment Questions Patient states she has had intermittent back pain for a couple of years and has previously had an MRI done in 2023, however, she feels pain has worsened recently and is now radiating down left hip. She has been taking acetaminophen  with a little relief. Reports 8/10 pain with activities. Office visit advised.   1. ONSET: When did the pain begin? (e.g., minutes, hours, days)     Patient states she has had intermittent pain for a couple of years  2. LOCATION: Where does it hurt? (upper, mid or lower back)     Lower back  3. SEVERITY: How bad is the pain?  (e.g., Scale 1-10; mild, moderate, or severe)     Currently 5-6/10, 8/10 with movement  4. PATTERN: Is the pain constant? (e.g., yes, no; constant, intermittent)      Constant  5. RADIATION: Does the pain shoot into your legs or somewhere else?     Down to left hip  6. CAUSE:  What do you think is causing the back pain?      Not sure  7. BACK OVERUSE:  Any recent lifting of heavy objects, strenuous work or exercise?     No  8. MEDICINES: What have you taken so far for the pain? (e.g., nothing, acetaminophen , NSAIDS)     Acetaminophen   9. NEUROLOGIC SYMPTOMS: Do you have any weakness, numbness, or problems with bowel/bladder control?     Has had ongoing bladder control issues, states  this may be a little worse  10. OTHER SYMPTOMS: Do you have any other symptoms? (e.g., fever, abdomen pain, burning with urination, blood in urine)       Denies any other symptoms  11. PREGNANCY: Is there any chance you are pregnant? When was your last menstrual period?       NA  Protocols used: Back Pain-A-AH  Reason for Triage: serve back pain expanding to left hip

## 2024-10-03 ENCOUNTER — Ambulatory Visit (INDEPENDENT_AMBULATORY_CARE_PROVIDER_SITE_OTHER): Admitting: Otolaryngology

## 2024-10-14 ENCOUNTER — Ambulatory Visit: Admitting: Family Medicine

## 2025-01-01 ENCOUNTER — Ambulatory Visit: Admitting: Internal Medicine

## 2025-03-28 ENCOUNTER — Ambulatory Visit: Admitting: Internal Medicine

## 2025-06-13 ENCOUNTER — Ambulatory Visit
